# Patient Record
Sex: Male | Born: 1945 | Race: White | Hispanic: No | Marital: Married | Smoking: Former smoker
Health system: Southern US, Community
[De-identification: ages and names within clinical notes are randomized; demographics above are authoritative.]

## PROBLEM LIST (undated history)

## (undated) DIAGNOSIS — K5790 Diverticulosis of intestine, part unspecified, without perforation or abscess without bleeding: Secondary | ICD-10-CM

## (undated) DIAGNOSIS — G44009 Cluster headache syndrome, unspecified, not intractable: Secondary | ICD-10-CM

## (undated) DIAGNOSIS — G459 Transient cerebral ischemic attack, unspecified: Secondary | ICD-10-CM

## (undated) DIAGNOSIS — D369 Benign neoplasm, unspecified site: Secondary | ICD-10-CM

## (undated) DIAGNOSIS — Z6841 Body Mass Index (BMI) 40.0 and over, adult: Secondary | ICD-10-CM

## (undated) DIAGNOSIS — I440 Atrioventricular block, first degree: Secondary | ICD-10-CM

## (undated) DIAGNOSIS — K648 Other hemorrhoids: Secondary | ICD-10-CM

## (undated) DIAGNOSIS — I4891 Unspecified atrial fibrillation: Secondary | ICD-10-CM

## (undated) DIAGNOSIS — Z950 Presence of cardiac pacemaker: Secondary | ICD-10-CM

## (undated) DIAGNOSIS — R001 Bradycardia, unspecified: Secondary | ICD-10-CM

## (undated) DIAGNOSIS — I498 Other specified cardiac arrhythmias: Secondary | ICD-10-CM

## (undated) DIAGNOSIS — B999 Unspecified infectious disease: Secondary | ICD-10-CM

## (undated) DIAGNOSIS — C4490 Unspecified malignant neoplasm of skin, unspecified: Secondary | ICD-10-CM

## (undated) DIAGNOSIS — I10 Essential (primary) hypertension: Secondary | ICD-10-CM

## (undated) DIAGNOSIS — Z8739 Personal history of other diseases of the musculoskeletal system and connective tissue: Secondary | ICD-10-CM

## (undated) DIAGNOSIS — I5032 Chronic diastolic (congestive) heart failure: Secondary | ICD-10-CM

## (undated) DIAGNOSIS — I422 Other hypertrophic cardiomyopathy: Secondary | ICD-10-CM

## (undated) HISTORY — DX: Unspecified atrial fibrillation: I48.91

## (undated) HISTORY — DX: Bradycardia, unspecified: R00.1

## (undated) HISTORY — PX: INSERT / REPLACE / REMOVE PACEMAKER: SUR710

## (undated) HISTORY — PX: SKIN CANCER EXCISION: SHX779

## (undated) HISTORY — DX: Other specified cardiac arrhythmias: I49.8

## (undated) HISTORY — DX: Diverticulosis of intestine, part unspecified, without perforation or abscess without bleeding: K57.90

## (undated) HISTORY — DX: Benign neoplasm, unspecified site: D36.9

## (undated) HISTORY — DX: Transient cerebral ischemic attack, unspecified: G45.9

## (undated) HISTORY — PX: CARDIAC CATHETERIZATION: SHX172

## (undated) HISTORY — DX: Chronic diastolic (congestive) heart failure: I50.32

## (undated) HISTORY — DX: Morbid (severe) obesity due to excess calories: E66.01

## (undated) HISTORY — DX: Other hypertrophic cardiomyopathy: I42.2

## (undated) HISTORY — DX: Other hemorrhoids: K64.8

## (undated) HISTORY — DX: Atrioventricular block, first degree: I44.0

## (undated) HISTORY — PX: CARDIAC VALVE REPLACEMENT: SHX585

## (undated) HISTORY — PX: COLONOSCOPY: SHX174

## (undated) HISTORY — DX: Presence of cardiac pacemaker: Z95.0

## (undated) HISTORY — DX: Essential (primary) hypertension: I10

---

## 1997-07-22 HISTORY — PX: MITRAL VALVE REPLACEMENT: SHX147

## 1997-07-22 HISTORY — PX: OTHER SURGICAL HISTORY: SHX169

## 1997-07-22 HISTORY — PX: AORTIC VALVE REPAIR: SHX6306

## 1999-04-11 DIAGNOSIS — G459 Transient cerebral ischemic attack, unspecified: Secondary | ICD-10-CM

## 1999-04-11 HISTORY — DX: Transient cerebral ischemic attack, unspecified: G45.9

## 1999-07-11 ENCOUNTER — Ambulatory Visit (HOSPITAL_COMMUNITY): Admission: RE | Admit: 1999-07-11 | Discharge: 1999-07-11 | Payer: Self-pay | Admitting: Cardiology

## 2000-01-03 ENCOUNTER — Ambulatory Visit (HOSPITAL_COMMUNITY): Admission: RE | Admit: 2000-01-03 | Discharge: 2000-01-03 | Payer: Self-pay | Admitting: *Deleted

## 2005-10-19 ENCOUNTER — Ambulatory Visit (HOSPITAL_COMMUNITY): Admission: RE | Admit: 2005-10-19 | Discharge: 2005-10-19 | Payer: Self-pay | Admitting: Gastroenterology

## 2005-10-19 ENCOUNTER — Encounter (INDEPENDENT_AMBULATORY_CARE_PROVIDER_SITE_OTHER): Payer: Self-pay | Admitting: Specialist

## 2006-10-03 ENCOUNTER — Encounter: Payer: Self-pay | Admitting: Internal Medicine

## 2006-10-03 ENCOUNTER — Ambulatory Visit: Payer: Self-pay | Admitting: Internal Medicine

## 2006-10-10 ENCOUNTER — Ambulatory Visit: Payer: Self-pay | Admitting: Internal Medicine

## 2006-10-10 LAB — CONVERTED CEMR LAB
BUN: 14 mg/dL (ref 6–23)
Basophils Relative: 0.5 % (ref 0.0–1.0)
Eosinophils Relative: 1.6 % (ref 0.0–5.0)
GFR calc Af Amer: 111 mL/min
GFR calc non Af Amer: 92 mL/min
Glucose, Bld: 102 mg/dL — ABNORMAL HIGH (ref 70–99)
HCT: 44.7 % (ref 39.0–52.0)
Lymphocytes Relative: 16.1 % (ref 12.0–46.0)
MCHC: 33.8 g/dL (ref 30.0–36.0)
Magnesium: 2.2 mg/dL (ref 1.5–2.5)
Monocytes Absolute: 1.1 10*3/uL — ABNORMAL HIGH (ref 0.2–0.7)
Monocytes Relative: 10.7 % (ref 3.0–11.0)
Neutro Abs: 7 10*3/uL (ref 1.4–7.7)
Neutrophils Relative %: 71.1 % (ref 43.0–77.0)
Potassium: 3.6 meq/L (ref 3.5–5.1)
RDW: 12.8 % (ref 11.5–14.6)
TSH: 2.18 microintl units/mL (ref 0.35–5.50)
WBC: 9.9 10*3/uL (ref 4.5–10.5)

## 2006-10-18 ENCOUNTER — Ambulatory Visit: Payer: Self-pay

## 2006-10-22 ENCOUNTER — Ambulatory Visit: Payer: Self-pay

## 2006-10-31 ENCOUNTER — Ambulatory Visit: Payer: Self-pay | Admitting: Cardiology

## 2006-11-01 ENCOUNTER — Ambulatory Visit: Payer: Self-pay

## 2006-11-02 ENCOUNTER — Ambulatory Visit: Payer: Self-pay

## 2006-11-14 ENCOUNTER — Ambulatory Visit: Payer: Self-pay | Admitting: Pulmonary Disease

## 2006-11-20 ENCOUNTER — Ambulatory Visit: Admission: RE | Admit: 2006-11-20 | Discharge: 2006-11-20 | Payer: Self-pay | Admitting: Pulmonary Disease

## 2006-12-24 ENCOUNTER — Ambulatory Visit: Payer: Self-pay | Admitting: Pulmonary Disease

## 2007-06-05 ENCOUNTER — Encounter: Payer: Self-pay | Admitting: Pulmonary Disease

## 2007-06-10 DIAGNOSIS — R0602 Shortness of breath: Secondary | ICD-10-CM

## 2007-06-10 DIAGNOSIS — I10 Essential (primary) hypertension: Secondary | ICD-10-CM

## 2007-06-10 DIAGNOSIS — Z85820 Personal history of malignant melanoma of skin: Secondary | ICD-10-CM

## 2007-06-10 DIAGNOSIS — I421 Obstructive hypertrophic cardiomyopathy: Secondary | ICD-10-CM

## 2007-06-10 DIAGNOSIS — R942 Abnormal results of pulmonary function studies: Secondary | ICD-10-CM | POA: Insufficient documentation

## 2007-06-11 ENCOUNTER — Telehealth: Payer: Self-pay | Admitting: Pulmonary Disease

## 2007-06-12 ENCOUNTER — Telehealth: Payer: Self-pay | Admitting: Pulmonary Disease

## 2007-11-18 ENCOUNTER — Ambulatory Visit: Payer: Self-pay | Admitting: Cardiology

## 2007-11-18 ENCOUNTER — Ambulatory Visit: Payer: Self-pay

## 2007-11-18 LAB — CONVERTED CEMR LAB
Basophils Absolute: 0 10*3/uL (ref 0.0–0.1)
Basophils Relative: 0.3 % (ref 0.0–3.0)
Eosinophils Absolute: 0.2 10*3/uL (ref 0.0–0.7)
Hemoglobin: 13.8 g/dL (ref 13.0–17.0)
Lymphocytes Relative: 14.6 % (ref 12.0–46.0)
Neutro Abs: 7 10*3/uL (ref 1.4–7.7)
Neutrophils Relative %: 73.4 % (ref 43.0–77.0)
Platelets: 265 10*3/uL (ref 150–400)
RDW: 13.5 % (ref 11.5–14.6)

## 2007-11-20 ENCOUNTER — Telehealth (INDEPENDENT_AMBULATORY_CARE_PROVIDER_SITE_OTHER): Payer: Self-pay | Admitting: *Deleted

## 2008-05-12 ENCOUNTER — Ambulatory Visit: Payer: Self-pay | Admitting: Internal Medicine

## 2008-05-12 ENCOUNTER — Ambulatory Visit: Payer: Self-pay | Admitting: Cardiology

## 2008-05-12 LAB — CONVERTED CEMR LAB
BUN: 15 mg/dL (ref 6–23)
Calcium: 9.3 mg/dL (ref 8.4–10.5)
Chloride: 99 meq/L (ref 96–112)
Creatinine, Ser: 0.8 mg/dL (ref 0.4–1.5)
GFR calc non Af Amer: 105 mL/min
Glucose, Bld: 104 mg/dL — ABNORMAL HIGH (ref 70–99)
Pro B Natriuretic peptide (BNP): 25 pg/mL (ref 0.0–100.0)
Sodium: 138 meq/L (ref 135–145)

## 2008-05-29 ENCOUNTER — Ambulatory Visit: Payer: Self-pay | Admitting: Internal Medicine

## 2008-05-29 ENCOUNTER — Ambulatory Visit (HOSPITAL_COMMUNITY): Admission: RE | Admit: 2008-05-29 | Discharge: 2008-05-29 | Payer: Self-pay | Admitting: Internal Medicine

## 2008-06-17 ENCOUNTER — Ambulatory Visit: Payer: Self-pay | Admitting: Internal Medicine

## 2008-07-17 DIAGNOSIS — I440 Atrioventricular block, first degree: Secondary | ICD-10-CM | POA: Insufficient documentation

## 2008-07-20 ENCOUNTER — Ambulatory Visit: Payer: Self-pay | Admitting: Internal Medicine

## 2008-07-21 ENCOUNTER — Ambulatory Visit: Payer: Self-pay | Admitting: Internal Medicine

## 2008-07-21 LAB — CONVERTED CEMR LAB
BUN: 15 mg/dL (ref 6–23)
Basophils Relative: 0.5 % (ref 0.0–3.0)
CO2: 30 meq/L (ref 19–32)
Calcium: 8.9 mg/dL (ref 8.4–10.5)
Creatinine, Ser: 1 mg/dL (ref 0.4–1.5)
Eosinophils Relative: 2.7 % (ref 0.0–5.0)
Lymphocytes Relative: 18 % (ref 12.0–46.0)
MCHC: 34.8 g/dL (ref 30.0–36.0)
MCV: 94.7 fL (ref 78.0–100.0)
Monocytes Relative: 9.2 % (ref 3.0–12.0)
Potassium: 3.5 meq/L (ref 3.5–5.1)
Prothrombin Time: 32.9 s — ABNORMAL HIGH (ref 10.9–13.3)
WBC: 7.1 10*3/uL (ref 4.5–10.5)
aPTT: 40 s — ABNORMAL HIGH (ref 21.7–28.8)

## 2008-07-22 ENCOUNTER — Ambulatory Visit (HOSPITAL_COMMUNITY): Admission: RE | Admit: 2008-07-22 | Discharge: 2008-07-23 | Payer: Self-pay | Admitting: Internal Medicine

## 2008-07-22 ENCOUNTER — Ambulatory Visit: Payer: Self-pay | Admitting: Internal Medicine

## 2008-07-23 ENCOUNTER — Encounter: Payer: Self-pay | Admitting: Internal Medicine

## 2008-07-27 ENCOUNTER — Encounter: Payer: Self-pay | Admitting: *Deleted

## 2008-07-27 ENCOUNTER — Ambulatory Visit: Payer: Self-pay

## 2008-08-12 ENCOUNTER — Telehealth: Payer: Self-pay | Admitting: Internal Medicine

## 2008-08-19 ENCOUNTER — Encounter (INDEPENDENT_AMBULATORY_CARE_PROVIDER_SITE_OTHER): Payer: Self-pay | Admitting: *Deleted

## 2008-09-08 ENCOUNTER — Encounter: Payer: Self-pay | Admitting: *Deleted

## 2008-09-21 ENCOUNTER — Encounter: Payer: Self-pay | Admitting: Internal Medicine

## 2008-09-22 ENCOUNTER — Encounter: Payer: Self-pay | Admitting: Internal Medicine

## 2008-09-24 ENCOUNTER — Ambulatory Visit: Payer: Self-pay

## 2008-09-24 ENCOUNTER — Encounter: Payer: Self-pay | Admitting: Internal Medicine

## 2008-10-08 ENCOUNTER — Encounter (INDEPENDENT_AMBULATORY_CARE_PROVIDER_SITE_OTHER): Payer: Self-pay | Admitting: *Deleted

## 2008-10-09 ENCOUNTER — Encounter: Payer: Self-pay | Admitting: Cardiovascular Disease

## 2008-10-14 ENCOUNTER — Encounter: Payer: Self-pay | Admitting: *Deleted

## 2008-10-16 ENCOUNTER — Encounter: Payer: Self-pay | Admitting: Cardiology

## 2008-10-16 ENCOUNTER — Encounter: Payer: Self-pay | Admitting: Internal Medicine

## 2008-10-20 ENCOUNTER — Encounter: Payer: Self-pay | Admitting: Cardiology

## 2008-10-22 ENCOUNTER — Telehealth: Payer: Self-pay | Admitting: Internal Medicine

## 2008-10-25 ENCOUNTER — Encounter: Payer: Self-pay | Admitting: Cardiology

## 2008-10-26 ENCOUNTER — Encounter: Payer: Self-pay | Admitting: Cardiology

## 2008-10-26 LAB — CONVERTED CEMR LAB: POC INR: 2.8

## 2008-11-06 ENCOUNTER — Encounter (INDEPENDENT_AMBULATORY_CARE_PROVIDER_SITE_OTHER): Payer: Self-pay | Admitting: *Deleted

## 2008-11-09 ENCOUNTER — Encounter: Payer: Self-pay | Admitting: Internal Medicine

## 2008-11-09 LAB — CONVERTED CEMR LAB: POC INR: 2.5

## 2008-11-12 ENCOUNTER — Encounter (INDEPENDENT_AMBULATORY_CARE_PROVIDER_SITE_OTHER): Payer: Self-pay | Admitting: *Deleted

## 2008-11-13 ENCOUNTER — Encounter: Payer: Self-pay | Admitting: Cardiology

## 2008-11-13 LAB — CONVERTED CEMR LAB: POC INR: 3

## 2008-11-20 ENCOUNTER — Encounter (INDEPENDENT_AMBULATORY_CARE_PROVIDER_SITE_OTHER): Payer: Self-pay | Admitting: *Deleted

## 2008-11-23 ENCOUNTER — Ambulatory Visit: Payer: Self-pay | Admitting: Internal Medicine

## 2008-11-23 DIAGNOSIS — I472 Ventricular tachycardia: Secondary | ICD-10-CM

## 2008-11-26 ENCOUNTER — Telehealth: Payer: Self-pay | Admitting: Internal Medicine

## 2008-12-03 ENCOUNTER — Encounter: Payer: Self-pay | Admitting: Cardiovascular Disease

## 2008-12-08 ENCOUNTER — Telehealth (INDEPENDENT_AMBULATORY_CARE_PROVIDER_SITE_OTHER): Payer: Self-pay | Admitting: *Deleted

## 2008-12-16 ENCOUNTER — Telehealth (INDEPENDENT_AMBULATORY_CARE_PROVIDER_SITE_OTHER): Payer: Self-pay | Admitting: *Deleted

## 2008-12-19 ENCOUNTER — Encounter: Payer: Self-pay | Admitting: Internal Medicine

## 2008-12-21 ENCOUNTER — Encounter: Payer: Self-pay | Admitting: Cardiovascular Disease

## 2008-12-21 LAB — CONVERTED CEMR LAB: POC INR: 3

## 2008-12-23 ENCOUNTER — Ambulatory Visit: Payer: Self-pay | Admitting: Internal Medicine

## 2008-12-25 ENCOUNTER — Telehealth (INDEPENDENT_AMBULATORY_CARE_PROVIDER_SITE_OTHER): Payer: Self-pay | Admitting: *Deleted

## 2009-01-03 ENCOUNTER — Encounter (INDEPENDENT_AMBULATORY_CARE_PROVIDER_SITE_OTHER): Payer: Self-pay | Admitting: Pharmacist

## 2009-01-04 ENCOUNTER — Encounter: Payer: Self-pay | Admitting: Cardiology

## 2009-01-04 LAB — CONVERTED CEMR LAB: POC INR: 3.5

## 2009-01-06 ENCOUNTER — Telehealth (INDEPENDENT_AMBULATORY_CARE_PROVIDER_SITE_OTHER): Payer: Self-pay | Admitting: *Deleted

## 2009-01-12 ENCOUNTER — Encounter: Payer: Self-pay | Admitting: Cardiology

## 2009-01-12 LAB — CONVERTED CEMR LAB: POC INR: 3.4

## 2009-01-21 ENCOUNTER — Encounter (INDEPENDENT_AMBULATORY_CARE_PROVIDER_SITE_OTHER): Payer: Self-pay | Admitting: Pharmacist

## 2009-01-22 ENCOUNTER — Encounter: Payer: Self-pay | Admitting: Internal Medicine

## 2009-02-05 ENCOUNTER — Encounter: Payer: Self-pay | Admitting: Internal Medicine

## 2009-02-08 ENCOUNTER — Encounter: Payer: Self-pay | Admitting: Cardiology

## 2009-02-25 ENCOUNTER — Encounter: Payer: Self-pay | Admitting: Internal Medicine

## 2009-02-25 ENCOUNTER — Encounter (INDEPENDENT_AMBULATORY_CARE_PROVIDER_SITE_OTHER): Payer: Self-pay | Admitting: Cardiology

## 2009-02-26 ENCOUNTER — Encounter: Payer: Self-pay | Admitting: Internal Medicine

## 2009-03-13 ENCOUNTER — Encounter (INDEPENDENT_AMBULATORY_CARE_PROVIDER_SITE_OTHER): Payer: Self-pay | Admitting: Cardiology

## 2009-03-13 ENCOUNTER — Encounter: Payer: Self-pay | Admitting: Cardiovascular Disease

## 2009-03-15 ENCOUNTER — Ambulatory Visit: Payer: Self-pay

## 2009-03-15 ENCOUNTER — Encounter: Payer: Self-pay | Admitting: Internal Medicine

## 2009-03-16 ENCOUNTER — Encounter: Payer: Self-pay | Admitting: Cardiovascular Disease

## 2009-03-30 ENCOUNTER — Encounter: Payer: Self-pay | Admitting: Internal Medicine

## 2009-03-30 LAB — CONVERTED CEMR LAB: POC INR: 3.1

## 2009-04-13 ENCOUNTER — Ambulatory Visit: Payer: Self-pay | Admitting: Internal Medicine

## 2009-04-13 ENCOUNTER — Encounter: Payer: Self-pay | Admitting: Internal Medicine

## 2009-04-15 LAB — CONVERTED CEMR LAB
BUN: 10 mg/dL (ref 6–23)
CO2: 26 meq/L (ref 19–32)
Creatinine, Ser: 0.8 mg/dL (ref 0.4–1.5)
GFR calc non Af Amer: 104.44 mL/min (ref >60)
Glucose, Bld: 91 mg/dL (ref 70–99)
Pro B Natriuretic peptide (BNP): 52 pg/mL (ref 0.0–100.0)

## 2009-04-16 ENCOUNTER — Encounter: Payer: Self-pay | Admitting: Cardiovascular Disease

## 2009-04-21 ENCOUNTER — Ambulatory Visit (HOSPITAL_COMMUNITY): Admission: RE | Admit: 2009-04-21 | Discharge: 2009-04-21 | Payer: Self-pay | Admitting: Internal Medicine

## 2009-04-21 ENCOUNTER — Ambulatory Visit: Payer: Self-pay | Admitting: Cardiovascular Disease

## 2009-04-21 ENCOUNTER — Ambulatory Visit: Payer: Self-pay

## 2009-04-21 ENCOUNTER — Encounter: Payer: Self-pay | Admitting: Internal Medicine

## 2009-05-04 ENCOUNTER — Telehealth: Payer: Self-pay | Admitting: Internal Medicine

## 2009-05-04 ENCOUNTER — Encounter (INDEPENDENT_AMBULATORY_CARE_PROVIDER_SITE_OTHER): Payer: Self-pay | Admitting: Cardiology

## 2009-05-04 ENCOUNTER — Ambulatory Visit: Payer: Self-pay | Admitting: Internal Medicine

## 2009-05-05 ENCOUNTER — Encounter: Payer: Self-pay | Admitting: Internal Medicine

## 2009-05-05 ENCOUNTER — Ambulatory Visit: Payer: Self-pay | Admitting: Internal Medicine

## 2009-05-05 ENCOUNTER — Encounter (INDEPENDENT_AMBULATORY_CARE_PROVIDER_SITE_OTHER): Payer: Self-pay | Admitting: Cardiology

## 2009-05-05 ENCOUNTER — Ambulatory Visit: Payer: Self-pay | Admitting: Cardiology

## 2009-05-06 ENCOUNTER — Telehealth (INDEPENDENT_AMBULATORY_CARE_PROVIDER_SITE_OTHER): Payer: Self-pay | Admitting: *Deleted

## 2009-05-12 ENCOUNTER — Ambulatory Visit: Payer: Self-pay | Admitting: Cardiovascular Disease

## 2009-05-12 ENCOUNTER — Encounter (INDEPENDENT_AMBULATORY_CARE_PROVIDER_SITE_OTHER): Payer: Self-pay | Admitting: Cardiology

## 2009-05-13 ENCOUNTER — Ambulatory Visit: Payer: Self-pay | Admitting: Internal Medicine

## 2009-05-14 ENCOUNTER — Encounter (INDEPENDENT_AMBULATORY_CARE_PROVIDER_SITE_OTHER): Payer: Self-pay | Admitting: Cardiology

## 2009-05-14 ENCOUNTER — Ambulatory Visit: Payer: Self-pay | Admitting: Cardiovascular Disease

## 2009-05-17 ENCOUNTER — Telehealth: Payer: Self-pay | Admitting: Internal Medicine

## 2009-05-22 ENCOUNTER — Encounter (INDEPENDENT_AMBULATORY_CARE_PROVIDER_SITE_OTHER): Payer: Self-pay | Admitting: Cardiology

## 2009-05-24 ENCOUNTER — Encounter: Payer: Self-pay | Admitting: Cardiology

## 2009-05-24 LAB — CONVERTED CEMR LAB: POC INR: 3.5

## 2009-05-28 ENCOUNTER — Telehealth (INDEPENDENT_AMBULATORY_CARE_PROVIDER_SITE_OTHER): Payer: Self-pay | Admitting: *Deleted

## 2009-06-09 ENCOUNTER — Encounter: Payer: Self-pay | Admitting: Cardiology

## 2009-06-09 LAB — CONVERTED CEMR LAB: POC INR: 2.6

## 2009-06-17 ENCOUNTER — Telehealth: Payer: Self-pay | Admitting: Internal Medicine

## 2009-06-18 ENCOUNTER — Encounter: Payer: Self-pay | Admitting: Internal Medicine

## 2009-06-23 ENCOUNTER — Encounter: Payer: Self-pay | Admitting: Internal Medicine

## 2009-06-27 ENCOUNTER — Encounter (INDEPENDENT_AMBULATORY_CARE_PROVIDER_SITE_OTHER): Payer: Self-pay | Admitting: Pharmacist

## 2009-06-28 ENCOUNTER — Encounter: Payer: Self-pay | Admitting: Cardiology

## 2009-06-28 LAB — CONVERTED CEMR LAB: POC INR: 4

## 2009-07-04 ENCOUNTER — Encounter (INDEPENDENT_AMBULATORY_CARE_PROVIDER_SITE_OTHER): Payer: Self-pay | Admitting: Pharmacist

## 2009-07-05 ENCOUNTER — Encounter: Payer: Self-pay | Admitting: Cardiovascular Disease

## 2009-07-26 ENCOUNTER — Encounter (INDEPENDENT_AMBULATORY_CARE_PROVIDER_SITE_OTHER): Payer: Self-pay | Admitting: Pharmacist

## 2009-07-26 ENCOUNTER — Encounter: Payer: Self-pay | Admitting: Cardiology

## 2009-07-26 LAB — CONVERTED CEMR LAB: POC INR: 3.5

## 2009-07-27 ENCOUNTER — Encounter: Payer: Self-pay | Admitting: Cardiology

## 2009-07-28 ENCOUNTER — Telehealth: Payer: Self-pay | Admitting: Internal Medicine

## 2009-08-01 ENCOUNTER — Encounter (INDEPENDENT_AMBULATORY_CARE_PROVIDER_SITE_OTHER): Payer: Self-pay | Admitting: Pharmacist

## 2009-08-02 ENCOUNTER — Encounter: Payer: Self-pay | Admitting: Cardiovascular Disease

## 2009-08-02 LAB — CONVERTED CEMR LAB: POC INR: 2.5

## 2009-08-10 ENCOUNTER — Encounter (INDEPENDENT_AMBULATORY_CARE_PROVIDER_SITE_OTHER): Payer: Self-pay | Admitting: Pharmacist

## 2009-08-10 ENCOUNTER — Ambulatory Visit: Payer: Self-pay | Admitting: Internal Medicine

## 2009-08-10 ENCOUNTER — Ambulatory Visit: Payer: Self-pay | Admitting: Cardiology

## 2009-08-10 DIAGNOSIS — I5032 Chronic diastolic (congestive) heart failure: Secondary | ICD-10-CM

## 2009-08-11 ENCOUNTER — Encounter: Payer: Self-pay | Admitting: Cardiology

## 2009-08-11 LAB — CONVERTED CEMR LAB: POC INR: 3.8

## 2009-08-12 ENCOUNTER — Ambulatory Visit: Payer: Self-pay | Admitting: Internal Medicine

## 2009-08-12 LAB — CONVERTED CEMR LAB
BUN: 18 mg/dL (ref 6–23)
CO2: 31 meq/L (ref 19–32)
Chloride: 95 meq/L — ABNORMAL LOW (ref 96–112)
Creatinine, Ser: 0.8 mg/dL (ref 0.4–1.5)
Potassium: 3.7 meq/L (ref 3.5–5.1)

## 2009-08-26 ENCOUNTER — Ambulatory Visit: Payer: Self-pay | Admitting: Internal Medicine

## 2009-08-26 LAB — CONVERTED CEMR LAB
INR: 2.3
Prothrombin Time: 23.4 s
Prothrombin Time: 23.4 s — ABNORMAL HIGH (ref 9.1–11.7)

## 2009-08-29 ENCOUNTER — Encounter (INDEPENDENT_AMBULATORY_CARE_PROVIDER_SITE_OTHER): Payer: Self-pay | Admitting: Pharmacist

## 2009-08-30 ENCOUNTER — Encounter: Payer: Self-pay | Admitting: Cardiology

## 2009-09-02 ENCOUNTER — Encounter: Payer: Self-pay | Admitting: Cardiology

## 2009-09-02 ENCOUNTER — Encounter: Payer: Self-pay | Admitting: Internal Medicine

## 2009-09-02 LAB — CONVERTED CEMR LAB: POC INR: 3.4

## 2009-09-03 ENCOUNTER — Encounter: Payer: Self-pay | Admitting: Cardiology

## 2009-09-19 ENCOUNTER — Encounter (INDEPENDENT_AMBULATORY_CARE_PROVIDER_SITE_OTHER): Payer: Self-pay | Admitting: Pharmacist

## 2009-09-20 ENCOUNTER — Encounter: Payer: Self-pay | Admitting: Internal Medicine

## 2009-10-15 ENCOUNTER — Encounter: Payer: Self-pay | Admitting: Internal Medicine

## 2009-10-22 ENCOUNTER — Encounter: Payer: Self-pay | Admitting: Cardiovascular Disease

## 2009-10-22 LAB — CONVERTED CEMR LAB: POC INR: 2.8

## 2009-11-15 ENCOUNTER — Telehealth: Payer: Self-pay | Admitting: Internal Medicine

## 2009-11-18 ENCOUNTER — Ambulatory Visit (HOSPITAL_COMMUNITY): Admission: RE | Admit: 2009-11-18 | Discharge: 2009-11-18 | Payer: Self-pay | Admitting: Gastroenterology

## 2009-11-19 ENCOUNTER — Encounter: Payer: Self-pay | Admitting: Internal Medicine

## 2009-12-01 ENCOUNTER — Ambulatory Visit: Payer: Self-pay | Admitting: Internal Medicine

## 2009-12-01 LAB — CONVERTED CEMR LAB: POC INR: 1.5

## 2009-12-03 ENCOUNTER — Ambulatory Visit: Payer: Self-pay | Admitting: Cardiology

## 2009-12-03 LAB — CONVERTED CEMR LAB: POC INR: 2.3

## 2009-12-08 ENCOUNTER — Encounter: Payer: Self-pay | Admitting: Cardiology

## 2009-12-29 ENCOUNTER — Encounter: Payer: Self-pay | Admitting: Internal Medicine

## 2009-12-30 ENCOUNTER — Encounter: Payer: Self-pay | Admitting: Cardiology

## 2009-12-30 LAB — CONVERTED CEMR LAB: POC INR: 2.4

## 2010-01-19 ENCOUNTER — Encounter: Payer: Self-pay | Admitting: Cardiology

## 2010-01-19 LAB — CONVERTED CEMR LAB: POC INR: 3.3

## 2010-01-20 ENCOUNTER — Encounter: Payer: Self-pay | Admitting: Cardiology

## 2010-02-16 ENCOUNTER — Encounter: Payer: Self-pay | Admitting: Internal Medicine

## 2010-02-17 ENCOUNTER — Ambulatory Visit: Payer: Self-pay | Admitting: Internal Medicine

## 2010-02-17 DIAGNOSIS — M109 Gout, unspecified: Secondary | ICD-10-CM

## 2010-02-22 ENCOUNTER — Encounter: Payer: Self-pay | Admitting: Internal Medicine

## 2010-03-17 ENCOUNTER — Encounter
Admission: RE | Admit: 2010-03-17 | Discharge: 2010-03-17 | Payer: Self-pay | Source: Home / Self Care | Attending: Family Medicine | Admitting: Family Medicine

## 2010-03-25 ENCOUNTER — Encounter (INDEPENDENT_AMBULATORY_CARE_PROVIDER_SITE_OTHER): Payer: Self-pay | Admitting: Pharmacist

## 2010-03-28 ENCOUNTER — Encounter: Payer: Self-pay | Admitting: Internal Medicine

## 2010-03-28 LAB — CONVERTED CEMR LAB: POC INR: 3.5

## 2010-04-12 ENCOUNTER — Encounter: Payer: Self-pay | Admitting: Cardiology

## 2010-04-12 LAB — CONVERTED CEMR LAB: POC INR: 2.9

## 2010-04-22 ENCOUNTER — Telehealth (INDEPENDENT_AMBULATORY_CARE_PROVIDER_SITE_OTHER): Payer: Self-pay | Admitting: *Deleted

## 2010-04-22 ENCOUNTER — Encounter (INDEPENDENT_AMBULATORY_CARE_PROVIDER_SITE_OTHER): Payer: Self-pay | Admitting: Pharmacist

## 2010-04-25 ENCOUNTER — Encounter: Payer: Self-pay | Admitting: Cardiology

## 2010-05-01 ENCOUNTER — Encounter: Payer: Self-pay | Admitting: Internal Medicine

## 2010-05-01 ENCOUNTER — Encounter (INDEPENDENT_AMBULATORY_CARE_PROVIDER_SITE_OTHER): Payer: Self-pay | Admitting: Pharmacist

## 2010-05-02 ENCOUNTER — Telehealth: Payer: Self-pay | Admitting: Internal Medicine

## 2010-05-02 ENCOUNTER — Encounter: Payer: Self-pay | Admitting: Cardiology

## 2010-05-03 ENCOUNTER — Ambulatory Visit
Admission: RE | Admit: 2010-05-03 | Discharge: 2010-05-03 | Payer: Self-pay | Source: Home / Self Care | Attending: Internal Medicine | Admitting: Internal Medicine

## 2010-05-03 ENCOUNTER — Encounter: Payer: Self-pay | Admitting: Internal Medicine

## 2010-05-04 ENCOUNTER — Encounter: Payer: Self-pay | Admitting: Internal Medicine

## 2010-05-08 ENCOUNTER — Encounter: Payer: Self-pay | Admitting: Cardiology

## 2010-05-08 ENCOUNTER — Encounter: Payer: Self-pay | Admitting: Internal Medicine

## 2010-05-08 LAB — CONVERTED CEMR LAB
BUN: 17 mg/dL (ref 6–23)
Basophils Absolute: 0 10*3/uL (ref 0.0–0.1)
Basophils Relative: 0.3 % (ref 0.0–3.0)
CO2: 31 meq/L (ref 19–32)
Calcium: 9.1 mg/dL (ref 8.4–10.5)
Calcium: 9.3 mg/dL (ref 8.4–10.5)
Chloride: 96 meq/L (ref 96–112)
Creatinine, Ser: 0.9 mg/dL (ref 0.4–1.5)
Creatinine, Ser: 0.9 mg/dL (ref 0.4–1.5)
GFR calc non Af Amer: 91.14 mL/min (ref >60)
Lymphocytes Relative: 12.1 % (ref 12.0–46.0)
MCHC: 32.7 g/dL (ref 30.0–36.0)
Monocytes Absolute: 0.4 10*3/uL (ref 0.1–1.0)
Monocytes Relative: 4.2 % (ref 3.0–12.0)
Neutro Abs: 7.6 10*3/uL (ref 1.4–7.7)
Potassium: 3.8 meq/L (ref 3.5–5.1)
RBC: 4.34 M/uL (ref 4.22–5.81)
RDW: 13.3 % (ref 11.5–14.6)
WBC: 9.3 10*3/uL (ref 4.5–10.5)

## 2010-05-09 ENCOUNTER — Encounter: Payer: Self-pay | Admitting: Cardiology

## 2010-05-10 NOTE — Medication Information (Signed)
Summary: INR Report  INR Report   Imported By: Lucretia Roers 09/29/2009 11:55:36  _____________________________________________________________________  External Attachment:    Type:   Image     Comment:   External Document

## 2010-05-10 NOTE — Medication Information (Signed)
Summary: rov/sp  Anticoagulant Therapy  Managed by: Gypsy Lore, PharmD Referring MD: [name] MD PCP: dr Bryn Gulling MD: Percival Spanish MD, Jeneen Rinks Indication 1: Mitral Valve Replacement (ICD-V43.3) Indication 2: TIA (ICD-435.9) Lab Used: Home Monitoring  [place] Site: [place] INR POC 2.3 INR RANGE 2.5 - 3.5  Dietary changes: no    Health status changes: no    Bleeding/hemorrhagic complications: no    Recent/future hospitalizations: no    Any changes in medication regimen? no    Recent/future dental: no  Any missed doses?: no       Is patient compliant with meds? yes       Allergies: No Known Drug Allergies  Anticoagulation Management History:      The patient is taking warfarin and comes in today for a routine follow up visit.  Negative risk factors for bleeding include an age less than 65 years old.  The bleeding index is 'low risk'.  Positive CHADS2 values include History of CHF and History of HTN.  Negative CHADS2 values include Age > 79 years old.  The start date was 04/10/1997.  His last INR was 2.3 ratio.  Anticoagulation responsible provider: Percival Spanish MD, Jeneen Rinks.  INR POC: 2.3.  C[place]ette Lot#: [id number].  Exp: 11/2010.    Anticoagulation Management Assessment/Plan:      The patient's current anticoagulation dose is Coumadin 10 mg  tabs: use as directed by coumadin clinic.  The target INR is 2.5 - 3.5.  The next INR is due 12/01/2009.  Anticoagulation instructions were given to patient.  Results were reviewed/authorized by Gypsy Lore, PharmD.  He was notified by Gypsy Lore PharmD.         Prior Anticoagulation Instructions: INR 1.5  Take 15mg  today and tomorrow. Recheck INR on Friday. Continue Arixtra   Current Anticoagulation Instructions: INR 2.3  Stop Arixtra today. Continue taking Coumadin 1 tab (10 mg) every day.  Recheck next Wednesday.

## 2010-05-10 NOTE — Letter (Signed)
Summary: Handout Printed  Printed Handout:  - Coumadin Instructions-w/out Meds 

## 2010-05-10 NOTE — Letter (Signed)
Summary: Cardiac Catheterization Instructions- [place], [place]  [id number] [place]   [place], [place] [address]   Phone: [phone]  Fax: [phone]     05/04/2009 MRN: [id number]  [place], [place]  [address]  Dear Mr. PLUMLEY,   You are scheduled for a Cardiac Catheterization on 05/10/2009 with Dr.Bensimhom.  Please arrive to the 1st floor of the [facility] and V[facility] Center at [facility] at 0730 am / pm on the day of your procedure. Please do not arrive before 6:30 a.m. Call the Heart and Vascular Center at [phone] if you are unable to make your appointmnet. The Code to get into the parking garage under the building is 0090. Take the elevators to the 1st floor. You must have someone to drive you home. Someone must be with you for the first 24 hours after you arrive home. Please wear clothes that are easy to get on and off and wear slip-on shoes. Do not eat or drink after midnight except water with your medications that morning. Bring all your medications and current insurance cards with you.  ___ Please see instructions given to you by Lalla Brothers D ___ Pre-med instructions:  ________________________________________________________________________________________________________________________________  The usual length of stay after your procedure is 2 to 3 hours. This can vary.  If you have any questions, please call the office at the number listed above.   Barnett Abu, RN, BSN

## 2010-05-10 NOTE — Medication Information (Signed)
Summary: Coumadin Clinic  Anticoagulant Therapy  Managed by: [name], RN, BSN Referring MD: [name] MD PCP: dr Bryn Gulling MD: [name] MD, Quillian Quince Indication 1: Mitral Valve Replacement (ICD-V43.3) Indication 2: TIA (ICD-435.9) Lab Used: Home Monitoring  [place] Site: [place] INR POC 2.6 INR RANGE 2.5 - 3.5  Dietary changes: no    Health status changes: no    Bleeding/hemorrhagic complications: no    Recent/future hospitalizations: no    Any changes in medication regimen? no    Recent/future dental: no  Any missed doses?: no       Is patient compliant with meds? yes      Comments: 11/18/09  Colonoscopy  Allergies: No Known Drug Allergies  Anticoagulation Management History:      His anticoagulation is being managed by telephone today.  Negative risk factors for bleeding include an age less than 17 years old.  The bleeding index is 'low risk'.  Positive CHADS2 values include History of CHF and History of HTN.  Negative CHADS2 values include Age > 81 years old.  The start date was 04/10/1997.  His last INR was 2.3 ratio.  Anticoagulation responsible provider: Hawk Mones MD, Quillian Quince.  INR POC: 2.6.    Anticoagulation Management Assessment/Plan:      The patient's current anticoagulation dose is Coumadin 10 mg  tabs: use as directed by coumadin clinic.  The target INR is 2.5 - 3.5.  The next INR is due 09/27/2009.  Anticoagulation instructions were given to patient.  Results were reviewed/authorized by [name], RN, BSN.  He was notified by [name], RN, BSN.         Prior Anticoagulation Instructions: INR 3.4  LMOM for pt.  Continue same dose of 5mg  daily.  Pt is instructed to call with any changes or questions.  Recheck INR in 1-2 weeks.   Current Anticoagulation Instructions: INR 2.6 Continue 10mg  daily. Recheck in one week. Self-tester.

## 2010-05-10 NOTE — Progress Notes (Signed)
Summary: Question about mediction injections  Phone Note From Pharmacy Call back at [phone]   Caller: [facility] Summary of Call: Question about medication Arixtra Initial call taken by: Delsa Sale,  November 15, 2009 10:34 AM  Follow-up for Phone Call        Pharmacist stated Rx needed prior auth before it could be filled.  Phone number: [phone].  Porfirio Oar PharmD  November 15, 2009 10:56 AM  Spoke with insurance company.  Problem fixed.  [facility] is aware to rerun the Rx.  LM for pt.    Follow-up by: Porfirio Oar PharmD,  November 15, 2009 1:13 PM

## 2010-05-10 NOTE — Medication Information (Signed)
Summary: Coumadin Clinic  Anticoagulant Therapy  Managed by: Porfirio Oar, PharmD Referring MD: [name] MD PCP: dr Bryn Gulling MD: Angelena Form MD, Harrell Gave Indication 1: Mitral Valve Replacement (ICD-V43.3) Indication 2: TIA (ICD-435.9) Lab Used: Home Monitoring  [place] Site: [place] INR POC 2.8 INR RANGE 2.5 - 3.5  Dietary changes: no    Health status changes: no    Bleeding/hemorrhagic complications: no    Recent/future hospitalizations: no    Any changes in medication regimen? no    Recent/future dental: no  Any missed doses?: no       Is patient compliant with meds? yes       Allergies: No Known Drug Allergies  Anticoagulation Management History:      His anticoagulation is being managed by telephone today.  Negative risk factors for bleeding include an age less than 55 years old.  The bleeding index is 'low risk'.  Positive CHADS2 values include History of CHF and History of HTN.  Negative CHADS2 values include Age > 21 years old.  The start date was 04/10/1997.  His last INR was 2.9 ratio.  Anticoagulation responsible provider: Angelena Form MD, Harrell Gave.  INR POC: 2.8.    Anticoagulation Management Assessment/Plan:      The patient's current anticoagulation dose is Coumadin 10 mg  tabs: use as directed by coumadin clinic.  The target INR is 2.5 - 3.5.  The next INR is due 07/12/2009.  Anticoagulation instructions were given to patient.  Results were reviewed/authorized by Porfirio Oar, PharmD.  He was notified by [name], RN, BSN.         Prior Anticoagulation Instructions: INR 4.0 Pt. states last night due to read he took 5mg s, thus instructed him to take 5mg s today as well. Recheck INR in one week.   Current Anticoagulation Instructions: INR 2.8  LM for pt to continue same dose and recheck INR in 1-2 weks. Porfirio Oar, Devin Going D  07/06/09- Telephoned pt and he answered, denies any changes or concerns. He is aware to recheck in one week. [name], RN, BSN  July 06, 2009 3:36 PM

## 2010-05-10 NOTE — Medication Information (Signed)
Summary: Coumadin Clinic  Anticoagulant Therapy  Managed by: [name], RN, BSN Referring MD: [name] MD PCP: dr Bryn Gulling MD: Rayann Heman MD, Jeneen Rinks Indication 1: Mitral Valve Replacement (ICD-V43.3) Indication 2: TIA (ICD-435.9) Lab Used: Home Monitoring  [place] Site: [place] INR POC 3.5 INR RANGE 2.5 - 3.5  Dietary changes: no    Health status changes: no    Bleeding/hemorrhagic complications: no    Recent/future hospitalizations: no    Any changes in medication regimen? no    Recent/future dental: no  Any missed doses?: no       Is patient compliant with meds? yes      Comments: Lab result received from home self tester pt. Drawn on 06/22/09. Pending GI next mth with Dr. Thera Flake, possible Colonoscopy. He will let us know date. He states he is usually covered with Arixtra.   Allergies: No Known Drug Allergies  Anticoagulation Management History:      His anticoagulation is being managed by telephone today.  Negative risk factors for bleeding include an age less than 44 years old.  The bleeding index is 'low risk'.  Positive CHADS2 values include History of CHF and History of HTN.  Negative CHADS2 values include Age > 62 years old.  The start date was 04/10/1997.  His last INR was 2.9 ratio.  Anticoagulation responsible provider: Takeya Marquis MD, Jeneen Rinks.  INR POC: 3.5.    Anticoagulation Management Assessment/Plan:      The patient's current anticoagulation dose is Coumadin 10 mg  tabs: use as directed by coumadin clinic.  The target INR is 2.5 - 3.5.  The next INR is due 06/30/2009.  Anticoagulation instructions were given to patient.  Results were reviewed/authorized by [name], RN, BSN.  He was notified by [name], RN, BSN.         Prior Anticoagulation Instructions: INR 2.6  Called spoke with pt.  Advised to continue on same dosage 10mg  daily.  Recheck in 1 week.  Pt is a self-tester.    Current Anticoagulation Instructions: INR 3.5 Continue  10mg s daily. Recheck in one week. Pt aware.

## 2010-05-10 NOTE — Miscellaneous (Signed)
  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 Jude ACCENT DR RF     (02/16/2010 10:36) Added new observation of PAST SURG HX: Mitral Valve replacement/Aortic Valve repair in April 1999 Surgical Myectomy for hypertrophic cardiomyopathy Colonoscopy (02/16/2010 10:36)       Past History:  Past Medical History: Current Problems:  S/P Mitral valve replacement S/P Aortic valve repair Hypertrophic Cardiomyopathy  with prior myectomy 1999 in [place], [place]    --cardiac cath in 1999 (pre-op): no CAD CHF -Diasystolic ATRIAL ARRHYTHMIAS -multiple noted at [facility] at [place], [place] (ICD-426.11) MORBID OBESITY (ICD-278.01) MELANOMA, HX OF (ICD-V10.82) HYPERTENSION (ICD-401.9) status post implant [employer] Jude ACCENT DR RF     Past Surgical History: Mitral Valve replacement/Aortic Valve repair in April 1999 Surgical Myectomy for hypertrophic cardiomyopathy Colonoscopy

## 2010-05-10 NOTE — Medication Information (Signed)
Summary: Coumadin Clinic  Anticoagulant Therapy  Managed by: Porfirio Oar, PharmD Referring MD: [name] MD PCP: dr Bryn Gulling MD: Stanford Breed MD, Aaron Edelman Indication 1: Mitral Valve Replacement (ICD-V43.3) Indication 2: TIA (ICD-435.9) Lab Used: Home Monitoring  [place] Site: [place] INR POC 3.8 INR RANGE 2.5 - 3.5  Dietary changes: no    Health status changes: no    Bleeding/hemorrhagic complications: no    Recent/future hospitalizations: no    Any changes in medication regimen? no    Recent/future dental: no  Any missed doses?: no       Is patient compliant with meds? yes      Comments: Spoke with pt.  There was a discrepancy between what our machine got and his (2.9 vs 3.8)  I called [name], the rep for [employer].  She is going to check with their customer service to determine the next step.  Mr. Denner meter is several years old and may need to be replaced. Porfirio Oar PharmD  Aug 12, 2009 2:35 PM   [name] from [employer] called and reported that pt will need to call customer care number and the tech team will be able to walk him through the quality control measures for his meter.  If they find an error, they will start the process to replace the meter.  Spoke with pt.  He has the phone number and will try to call this afternoon. Porfirio Oar PharmD  Aug 13, 2009 1:09 PM   Allergies: No Known Drug Allergies  Anticoagulation Management History:      Negative risk factors for bleeding include an age less than 72 years old.  The bleeding index is 'low risk'.  Positive CHADS2 values include History of CHF and History of HTN.  Negative CHADS2 values include Age > 83 years old.  The start date was 04/10/1997.  His last INR was 2.9 ratio.  Anticoagulation responsible provider: Stanford Breed MD, Aaron Edelman.  INR POC: 3.8.  Exp: 09/2010.    Anticoagulation Management Assessment/Plan:      The patient's current anticoagulation dose is Coumadin 10 mg  tabs: use as directed by coumadin  clinic.  The target INR is 2.5 - 3.5.  The next INR is due 08/18/2009.  Anticoagulation instructions were given to patient.  Results were reviewed/authorized by Porfirio Oar, PharmD.  He was notified by Porfirio Oar PharmD.         Prior Anticoagulation Instructions: INR 2.9 Last dose of coumadin 09/03/09, start Arixtra on 09/05/09 daily injection in morning. Last injection on 09/08/09. Procedure on 09/09/09. Restart per GI instructions coumadin and Arixtra.   Current Anticoagulation Instructions: LMOM for pt. Porfirio Oar PharmD  Aug 11, 2009 2:28 PM   Spoke with pt.  Eat a large dose of green vegetables and continue same dose of 1 tablet daily.  Will call pt once information available from [place]. Porfirio Oar PharmD  Aug 12, 2009 2:35 PM

## 2010-05-10 NOTE — Miscellaneous (Signed)
Summary: Orders Update  Clinical Lists Changes  Orders: Added new Test order of T-2 View CXR (71020TC) - Signed 

## 2010-05-10 NOTE — Cardiovascular Report (Signed)
Summary: Pre Cath Orders  Pre Cath Orders   Imported By: Sallee Provencal 05/19/2009 12:22:51  _____________________________________________________________________  External Attachment:    Type:   Image     Comment:   External Document

## 2010-05-10 NOTE — Consult Note (Signed)
Summary: [place]   Imported By: Marilynne Drivers 06/16/2009 13:04:47  _____________________________________________________________________  External Attachment:    Type:   Image     Comment:   External Document

## 2010-05-10 NOTE — Medication Information (Signed)
Summary: Coumadin Clinic  Anticoagulant Therapy  Managed by: Alinda Deem, PharmD, BCPS, CPP Referring MD: [name] MD PCP: dr Bryn Gulling MD: Aundra Dubin MD, Kirk Ruths Indication 1: Mitral Valve Replacement (ICD-V43.3) Indication 2: TIA (ICD-435.9) Lab Used: Home Monitoring  [place] Site: [place] INR POC 3.5 INR RANGE 2.5 - 3.5  Dietary changes: no    Health status changes: no    Bleeding/hemorrhagic complications: no    Recent/future hospitalizations: no    Any changes in medication regimen? no    Recent/future dental: no  Any missed doses?: no       Is patient compliant with meds? yes       Allergies (verified): No Known Drug Allergies  Anticoagulation Management History:      His anticoagulation is being managed by telephone today.  Negative risk factors for bleeding include an age less than 33 years old.  The bleeding index is 'low risk'.  Positive CHADS2 values include History of CHF and History of HTN.  Negative CHADS2 values include Age > 5 years old.  The start date was 04/10/1997.  His last INR was 2.9 ratio.  Anticoagulation responsible provider: Aundra Dubin MD, Gurtaj Ruz.  INR POC: 3.5.  Exp: 05/2010.    Anticoagulation Management Assessment/Plan:      The patient's current anticoagulation dose is Coumadin 10 mg  tabs: use as directed by coumadin clinic.  The target INR is 2.5 - 3.5.  The next INR is due 05/31/2009.  Anticoagulation instructions were given to patient.  Results were reviewed/authorized by Alinda Deem, PharmD, BCPS, CPP.  He was notified by Alinda Deem PharmD, BCPS, CPP.         Prior Anticoagulation Instructions: INR 2.5  Return to normal dosing of warfarin of 10 mg daily.    Call clinic at [phone] to finalize date of procedure, so that we can get switched back over to arixtra at the appropriate time.    Current Anticoagulation Instructions: INR 3.5  Continue 10 mg daily.  Recheck in 1 week.

## 2010-05-10 NOTE — Cardiovascular Report (Signed)
Summary: Pre Cath Orders   Pre Cath Orders   Imported By: Sallee Provencal 05/07/2009 16:51:23  _____________________________________________________________________  External Attachment:    Type:   Image     Comment:   External Document

## 2010-05-10 NOTE — Miscellaneous (Signed)
Summary: Advanced Written Confirmation of Verbal Orders   Advanced Written Confirmation of Verbal Orders   Imported By: Sallee Provencal 10/19/2009 14:24:08  _____________________________________________________________________  External Attachment:    Type:   Image     Comment:   External Document

## 2010-05-10 NOTE — Assessment & Plan Note (Signed)
Summary: [place]   Visit Type:  6 month follolw uo Primary Provider:  dr Jimmy Footman  CC:  swelling.  History of Present Illness: 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 He saw Dr. Reine Just recommended nocturnal oxygen and a right left heart catheter. The patient deferred on both as he had found that the interval dietary changes from my last visit to do aggressive low-salt diet and a higher diuretic regime had resulted in significant improvement.  Echo last month showed EF 55% with well functioning MVR. Estimated RVSP was 51.   He has been doing pretty well. He has intermittent significant deterioration in his symptoms. He has not noted any significant palpitations although he has had times when he has not been quite as aware of his heart valve clicking. These are brief period is not clear whether they correlate with his atrial fibrillation episodes as detected by his event recorder.  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 Taking 14 Tablets Daily 2)  Torsemide 20 Mg  Tabs (Torsemide) .... Take 3 Tabs By Mouth Once Daily or As Directed 3)  Furosemide 40  Mg  Tabs (Furosemide) .... 3 Daily Or, As Directed 4)  Diovan Hct 80-12.5 Mg  Tabs (Valsartan-Hydrochlorothiazide) .... Take 1 Tablet By Mouth Once A Day 5)  Coumadin 10 Mg  Tabs (Warfarin Sodium) .... Use As Directed By Coumadin Clinic 6)  Fish Oil 1000 Mg  Caps (Omega-3 Fatty Acids) .... Take 4 Tabs By Mouth Once Daily 7)  Cvs Vitamin C 1000 Mg  Tabs (Ascorbic Acid) .... Take 2 Tabs By Mouth Once Daily 8)  Multivitamins   Tabs (Multiple Vitamin) .... Take 1 Tablet By Mouth Once A Day 9)  Low-Dose Aspirin 81 Mg  Tabs (Aspirin) .... Take 1 Tablet By Mouth Once A Day 10)  Magnesium Oxide 400 Mg  Caps (Magnesium Oxide) .... Take 6 Tablet By Mouth Once A Dayor As Directed 11)  B 12 1,000mg  .... Daily  Allergies (verified): No Known Drug Allergies  Past History:  Past Medical History: Last updated: 02/16/2010 Current Problems:  S/P Mitral valve replacement S/P Aortic valve repair Hypertrophic Cardiomyopathy  with prior myectomy 1999 in [place], [place]    --cardiac cath in 1999 (pre-op): no CAD CHF -Diasystolic ATRIAL ARRHYTHMIAS -multiple noted at [facility] at [place] (ICD-426.11) MORBID OBESITY (ICD-278.01) MELANOMA, HX OF (ICD-V10.82) HYPERTENSION (ICD-401.9) status post implant [employer] ACCENT DR RF     Past Surgical History: Last updated: 02/16/2010 Mitral Valve replacement/Aortic Valve repair in April 1999 Surgical Myectomy for hypertrophic cardiomyopathy Colonoscopy  Family History: Last updated: 20-Jul-2008 Father: Deceased-Age 69 colon cancer Mother: living, reportedly healthy Siblings:  Sister living reportedly healthy, one sister with severe obesity and heart problems.  One brother good health with history of ablation.  Social History: Last updated: 07/17/2008 Full Time-[employer]  Tobacco Use - Former. Quit 22+ yrs Alcohol Use - yes-occasionally  Risk Factors: Smoking Status: quit (07/17/2008)  Vital Signs:  Patient profile:   65 year old  male Height:      72 inches Weight:      361.50 pounds BMI:     49.21 Pulse rate:   71 / minute BP sitting:   158 / 72  (left arm) Cuff size:   large  Vitals Entered By: Hansel Feinstein CMA (February 17, 2010 4:06 PM)  Physical Exam  General:  The patient was alert and oriented in no acute distress clinical he is morbidly obese HEENT Normal.  Neck veins were flat, carotids were brisk.  Lungs were clear.  Heart sounds were regular without murmurs or gallops.  Abdomen was soft with active bowel sounds. There is no clubbing cyanosis ; right  greater than left edema Skin Warm and dry    PPM Specifications Following MD:  [name], MD     PPM Vendor:  [employer]     PPM Model Number:  [id number]     PPM Serial Number:  [id number] PPM DOI:  07/22/2008     PPM Implanting MD:  [name], MD  Lead 1    Location: [facility]     DOI: 07/22/2008     Model #: [id number]     Serial #: [id number]     Status: active Lead 2    Location: RV     DOI: 07/22/2008     Model #: [id number]     Serial #: [id number]     Status: active  Magnet Response Rate:  BOL 100 ERI 85  Indications:  CHF; HOCM   PPM Follow Up Pacer Dependent:  No      Episodes Coumadin:  Yes  Parameters Mode:  DDD     Lower Rate Limit:  60     Upper Rate Limit:  125 Paced AV Delay:  200     Sensed AV Delay:  200  Impression & Recommendations:  Problem # 1:  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 I've asked that he followup with his primary care physician regarding gout prophylaxis.  Problem # 2:  ATRIAL ARRHYTHMIAS (ICD-427.89) His recurrent atrial arrhythmias. There is a discordance between his ANS counter andAT/AF burden log which I don't understand. We will begin in touch with [employer] to try to help clarify this. Interestingly however the one week that he had a half as defined by his brother in law and corresponded with a weak that he felt really lousy. This would be expected with his hypertrophic  cardiomyopathy. We will try and use a timedate record which he keeps and see how correlates with his AF log to decide whether to initiate antiarrhythmic therapy. He is appropriately on Coumadin. The following medications were removed from the medication list:    Arixtra 10 Mg/0.2ml Soln (Fondaparinux sodium) ..... Inject 1 syringe subcutaneously daily at 0700 am daily on 1/27 - 1/30.  restart injections daily on 2/1 as directed. His updated medication list for this problem includes:    Coumadin 10 Mg Tabs (Warfarin sodium) ..... Use as directed by coumadin clinic    Low-dose Aspirin 81 Mg Tabs (Aspirin) .Marland Kitchen... Take 1 tablet by mouth once a day  Orders:  TLB-BMP (Basic Metabolic Panel-BMET) (99991111) TLB-Magnesium (Mg) (83735-MG)  Problem # 3:  DIASTOLIC HEART FAILURE, CHRONIC (ICD-428.32) stable his medications are refilled. There is some unilateral edema. The following medications were removed from the medication list:    Arixtra 10 Mg/0.61ml Soln (Fondaparinux sodium) ..... Inject 1 syringe subcutaneously daily at 0700 am daily on 1/27 - 1/30.  restart injections daily on 2/1 as directed. His updated medication list for this problem includes:    Torsemide 20 Mg Tabs (Torsemide) .Marland Kitchen... Take 3 tabs by mouth once daily or as directed    Furosemide 40 Mg Tabs (Furosemide) .Marland KitchenMarland KitchenMarland KitchenMarland Kitchen 3 daily or, as directed    Diovan Hct 80-12.5 Mg Tabs (Valsartan-hydrochlorothiazide) .Marland Kitchen... Take 1 tablet by mouth once a day    Coumadin 10 Mg Tabs (Warfarin sodium) ..... Use as directed by coumadin clinic    Low-dose Aspirin 81 Mg Tabs (Aspirin) .Marland Kitchen... Take 1 tablet by mouth once a day  Orders: TLB-BMP (Basic Metabolic Panel-BMET) (99991111) TLB-Magnesium (Mg) (83735-MG)  Problem # 4:  CARDIOMYOPATHY, HYPERTROPHIC (ICD-425.1) as above  Problem # 5:  VALVULAR HEART DISEASE (ICD-424.90) The fact that he cannot hear his out of click although time is concerning. We will again try and track this with his dental  history as that may be related to rapid valve closure to the point that its  being muffled by frequency  Problem # 6:  PACEMAKER, STJ DDD (ICD-V45.01) Device parameters and data were reviewed and no changes were made  Patient Instructions: 1)  Your physician recommends that you continue on your current medications as directed. Please refer to the Current Medication list given to you today. 2)  Your physician wants you to follow-up in:  6 months with [facility] and Dr. [name]. You will receive a reminder letter in the mail two months in advance. If you don't receive a letter, please call our office to schedule the follow-up appointment. Prescriptions: FUROSEMIDE 40 MG  TABS (FUROSEMIDE) 3 daily or, as directed  #270 x 3   Entered by:   Joan Flores RN   Authorized by:   Nikki Dom, MD, [facility]   Signed by:   Joan Flores RN on 02/17/2010   Method used:   Print then Give to Patient   RxID:   [id number] TORSEMIDE 20 MG  TABS (TORSEMIDE) take 3 tabs by mouth once daily or as directed  #270 x 3   Entered by:   Joan Flores RN   Authorized by:   Nikki Dom, MD, [facility]   Signed by:   Joan Flores RN on 02/17/2010   Method used:   Print then Give to Patient   RxID:   [id number] KLOR-CON 10 10 MEQ CR-TABS (POTASSIUM CHLORIDE) taking 14 tablets daily  #1260 x 3   Entered by:   Joan Flores RN   Authorized by:   Nikki Dom, MD, [facility]   Signed by:   Joan Flores RN on 02/17/2010   Method used:   Print then Give to Patient   RxID:   [id number] DIOVAN HCT 80-12.5 MG  TABS (VALSARTAN-HYDROCHLOROTHIAZIDE) Take 1 tablet by mouth once a day  #90 x 3   Entered by:   Joan Flores RN   Authorized by:   Nikki Dom, MD, [facility]   Signed by:   Joan Flores RN on 02/17/2010   Method used:   Print then Give to Patient   [id number] COUMADIN 10 MG  TABS (WARFARIN SODIUM) use as  directed by coumadin clinic  #90 x 3    Entered by:   Joan Flores RN   Authorized by:   Nikki Dom, MD, [facility]   Signed by:   Joan Flores RN on 02/17/2010   Method used:   Print then Give to Patient   RxID:   [id number] PREDNISONE (PAK) 10 MG TABS (PREDNISONE) take as directed  #1 x 0   Entered by:   Joan Flores RN   Authorized by:   Nikki Dom, MD, [facility]   Signed by:   Joan Flores RN on 02/17/2010   Method used:   Electronically to        [facility] [place] (retail)       [address]       [place], [place]  [address]       Ph: [phone]       Fax: [phone]   RxID:   [id number]

## 2010-05-10 NOTE — Medication Information (Signed)
Summary: Coumadin Clinic  Anticoagulant Therapy  Managed by: Porfirio Oar, PharmD Referring MD: [name] MD PCP: dr Bryn Gulling MD: Ron Parker MD, Dellis Filbert Indication 1: Mitral Valve Replacement (ICD-V43.3) Indication 2: TIA (ICD-435.9) Lab Used: Home Monitoring  [place] Site: [place] INR POC 3.4 INR RANGE 2.5 - 3.5  Dietary changes: no    Health status changes: yes       Details: is postponing colonoscopy to later this summer.   Bleeding/hemorrhagic complications: no    Recent/future hospitalizations: no    Any changes in medication regimen? no    Recent/future dental: no  Any missed doses?: no       Is patient compliant with meds? yes       Allergies: No Known Drug Allergies  Anticoagulation Management History:      His anticoagulation is being managed by telephone today.  Negative risk factors for bleeding include an age less than 68 years old.  The bleeding index is 'low risk'.  Positive CHADS2 values include History of CHF and History of HTN.  Negative CHADS2 values include Age > 65 years old.  The start date was 04/10/1997.  His last INR was 2.3 ratio.  Anticoagulation responsible provider: Ron Parker MD, Dellis Filbert.  INR POC: 3.4.  Exp: 11/2010.    Anticoagulation Management Assessment/Plan:      The patient's current anticoagulation dose is Coumadin 10 mg  tabs: use as directed by coumadin clinic.  The target INR is 2.5 - 3.5.  The next INR is due 09/16/2009.  Anticoagulation instructions were given to patient.  Results were reviewed/authorized by Porfirio Oar, PharmD.  He was notified by Porfirio Oar PharmD.         Prior Anticoagulation Instructions: INR 2.4  Pt took extra 1/2 tablet yesterday.  Will take last dose of Coumadin on Wednesday, nothing on Thursday and start Arixtra on Friday.  Will recheck INR after procedure   Current Anticoagulation Instructions: INR 3.4  LMOM for pt.  Continue same dose of 5mg  daily.  Pt is instructed to call with any changes or  questions.  Recheck INR in 1-2 weeks.

## 2010-05-10 NOTE — Medication Information (Signed)
Summary: [email or web address]  Anticoagulant Therapy  Managed by: Alinda Deem, PharmD, BCPS, CPP Referring MD: [name] MD PCP: dr Bryn Gulling MD: Johnsie Cancel MD, Collier Salina Indication 1: Mitral Valve Replacement (ICD-V43.3) Indication 2: TIA (ICD-435.9) Lab Used: Home Monitoring  [place] Site: [place] INR POC 2.5 INR RANGE 2.5 - 3.5  Dietary changes: no    Health status changes: no    Bleeding/hemorrhagic complications: no    Recent/future hospitalizations: no    Any changes in medication regimen? no    Recent/future dental: no  Any missed doses?: no       Is patient compliant with meds? yes       Current Medications (verified): 1)  Potassium Chloride Crys Cr 20 Meq Cr-Tabs (Potassium Chloride Crys Cr) .... 4 Tabs Four Times A Day or As Needed 2)  Torsemide 20 Mg  Tabs (Torsemide) .... Take 2 Tabs By Mouth Once Daily or As Directed 3)  Furosemide 40 Mg  Tabs (Furosemide) .... As Directed 4)  Diovan Hct 80-12.5 Mg  Tabs (Valsartan-Hydrochlorothiazide) .... Take 1 Tablet By Mouth Once A Day 5)  Coumadin 10 Mg  Tabs (Warfarin Sodium) .... Use As Directed By Coumadin Clinic 6)  Fish Oil 1000 Mg  Caps (Omega-3 Fatty Acids) .... Take 4 Tabs By Mouth Once Daily 7)  Cvs Vitamin C 1000 Mg  Tabs (Ascorbic Acid) .... Take 2 Tabs By Mouth Once Daily 8)  Multivitamins   Tabs (Multiple Vitamin) .... Take 1 Tablet By Mouth Once A Day 9)  Low-Dose Aspirin 81 Mg  Tabs (Aspirin) .... Take 1 Tablet By Mouth Once A Day 10)  Magnesium Oxide 400 Mg  Caps (Magnesium Oxide) .... Take 6 Tablet By Mouth Once A Dayor As Directed 11)  B 12 1,000mg  .... Daily 12)  Arixtra 10 Mg/0.53ml Soln (Fondaparinux Sodium) .... Inject 1 Syringe Subcutaneously Daily At 0700 Am Daily On 1/27 - 1/30.  Restart Injections Daily On 2/1 As Directed.  Allergies: No Known Drug Allergies  Anticoagulation Management History:      Negative risk factors for bleeding include an age less than 3 years old.  The bleeding index is  'low risk'.  Positive CHADS2 values include History of CHF and History of HTN.  Negative CHADS2 values include Age > 10 years old.  The start date was 04/10/1997.  His last INR was 2.9 ratio.  Anticoagulation responsible provider: Johnsie Cancel MD, Collier Salina.  INR POC: 2.5.  Exp: 05/2010.    Anticoagulation Management Assessment/Plan:      The patient's current anticoagulation dose is Coumadin 10 mg  tabs: use as directed by coumadin clinic.  The target INR is 2.5 - 3.5.  The next INR is due 05/21/2009.  Anticoagulation instructions were given to patient.  Results were reviewed/authorized by Alinda Deem, PharmD, BCPS, CPP.  He was notified by Alinda Deem PharmD, BCPS, CPP.         Prior Anticoagulation Instructions: Take 15 mg for 2 days.  Continue arixtra daily injections.    Recheck Friday morning at 830 am.    Current Anticoagulation Instructions: INR 2.5  Return to normal dosing of warfarin of 10 mg daily.    Call clinic at [phone] to finalize date of procedure, so that we can get switched back over to arixtra at the appropriate time.

## 2010-05-10 NOTE — Medication Information (Signed)
Summary: [email or web address]  Anticoagulant Therapy  Managed by: Alinda Deem, PharmD, BCPS, CPP Referring MD: [name] MD PCP: dr Bryn Gulling MD: Ron Parker MD, Dellis Filbert Indication 1: Mitral Valve Replacement (ICD-V43.3) Indication 2: TIA (ICD-435.9) Lab Used: Home Monitoring  [place] Site: [place] INR POC 1.6 INR RANGE 2.5 - 3.5  Dietary changes: no    Health status changes: no    Bleeding/hemorrhagic complications: no    Recent/future hospitalizations: no    Any changes in medication regimen? no    Recent/future dental: no  Any missed doses?: no       Is patient compliant with meds? yes       Allergies (verified): No Known Drug Allergies  Anticoagulation Management History:      The patient is taking warfarin and comes in today for a routine follow up visit.  Negative risk factors for bleeding include an age less than 7 years old.  The bleeding index is 'low risk'.  Positive CHADS2 values include History of CHF and History of HTN.  Negative CHADS2 values include Age > 83 years old.  The start date was 04/10/1997.  His last INR was 2.9 ratio.  Anticoagulation responsible provider: Ron Parker MD, Dellis Filbert.  INR POC: 1.6.  Cuvette Lot#: [id number].  Exp: 05/2010.    Anticoagulation Management Assessment/Plan:      The patient's current anticoagulation dose is Coumadin 10 mg  tabs: use as directed by coumadin clinic.  The target INR is 2.5 - 3.5.  The next INR is due 05/14/2009.  Anticoagulation instructions were given to patient.  Results were reviewed/authorized by Alinda Deem, PharmD, BCPS, CPP.  He was notified by Alinda Deem PharmD, BCPS, CPP.         Prior Anticoagulation Instructions: Hold warfarin starting today.  On Sunday, 1/30, restart warfarin at 15 mg = 1.5 tabs daily.    Start arixtra 10 mg daily on 05/06/2009.  Give 1 dose daily at 0700.  Take dose on 1/30 no later than 0730 am.    Call with problems or issues.    Current Anticoagulation Instructions: Take 15 mg for 2  days.  Continue arixtra daily injections.    Recheck Friday morning at 830 am.

## 2010-05-10 NOTE — Medication Information (Signed)
Summary: Coumadin Clinic  Anticoagulant Therapy  Managed by: [name], RN, BSN Referring MD: [name] MD PCP: dr Bryn Gulling MD: Verl Blalock MD, Marcello Moores Indication 1: Mitral Valve Replacement (ICD-V43.3) Indication 2: TIA (ICD-435.9) Lab Used: Home Monitoring  [place] Site: [place] INR POC 3.3 INR RANGE 2.5 - 3.5  Dietary changes: no    Health status changes: no    Bleeding/hemorrhagic complications: no    Recent/future hospitalizations: no    Any changes in medication regimen? no    Recent/future dental: no  Any missed doses?: no       Is patient compliant with meds? yes      Comments: Self-tested on 10/12.  Allergies: No Known Drug Allergies  Anticoagulation Management History:      His anticoagulation is being managed by telephone today.  Negative risk factors for bleeding include an age less than 35 years old.  The bleeding index is 'low risk'.  Positive CHADS2 values include History of CHF and History of HTN.  Negative CHADS2 values include Age > 1 years old.  The start date was 04/10/1997.  His last INR was 2.3 ratio.  Anticoagulation responsible provider: Verl Blalock MD, Marcello Moores.  INR POC: 3.3.  Exp: 11/2010.    Anticoagulation Management Assessment/Plan:      The patient's current anticoagulation dose is Coumadin 10 mg  tabs: use as directed by coumadin clinic.  The target INR is 2.5 - 3.5.  The next INR is due 01/26/2010.  Anticoagulation instructions were given to patient.  Results were reviewed/authorized by [name], RN, BSN.  He was notified by [name] RN.         Prior Anticoagulation Instructions: INR 2.4 Today take 15mg s then resume 10mg s daily. Recheck in one week.   Current Anticoagulation Instructions: INR 3.3  LMOM for pt. Porfirio Oar PharmD  January 20, 2010 9:08 AM   Spoke with pt advised to continue taking 10mg  daily.  Recheck in 1 week. [name] RN  January 20, 2010 11:24 AM

## 2010-05-10 NOTE — Letter (Signed)
Summary: Cardiac Catheterization Instructions- [place], [place]  [id number] [place]   [place], [place] [address]   Phone: [phone]  Fax: [phone]     05/13/2009 MRN: [id number]  [place], [place]  [address]  Dear Mr. DOERFLINGER,   You are scheduled for a Cardiac Catheterization on  Fri 2/18 with  Dr. [name]  Please arrive to the 1st floor of the Heart and Vascular Center at [facility] at 7:30 am / pm on the day of your procedure. Please do not arrive before 6:30 a.m. Call the Heart and Vascular Center at [phone] if you are unable to make your appointmnet. The Code to get into the parking garage under the building is 0900. Take the elevators to the 1st floor. You must have someone to drive you home. Someone must be with you for the first 24 hours after you arrive home. Please wear clothes that are easy to get on and off and wear slip-on shoes. Do not eat or drink after midnight except water with your medications that morning. Bring all your medications and current insurance cards with you.  _X__ DO NOT take these medications before your procedure:   Torsemide or Furosemide   Alinda Deem will direct you on your coumadin dosing  ___ Make sure you take your aspirin.  ___ You may take ALL of your medications with water that morning. ________________________________________________________________________________________________________________________________  ___ DO NOT take ANY medications before your procedure.  ___ Pre-med instructions:  ________________________________________________________________________________________________________________________________  The usual length of stay after your procedure is 2 to 3 hours. This can vary.  If you have any questions, please call the office at the number listed above.   Kevan Rosebush, RN

## 2010-05-10 NOTE — Assessment & Plan Note (Signed)
Summary: [email or web address] [email or web address]   Primary Provider:  dr Jimmy Footman  CC:  pt sob and feeling as if he is holding a lot more fluid.  He is taking more of his fluid pills but reports he cannot take it as much as he needs it due to travel and work.  History of Present Illness: [name] is seen following pacer implantation for severe 1 AVB in the setting of hypertrophic cardiomyopathy  He was  much improved with vastly increased exercise tolerance.  However, recent months he has had a significant worsening in his overall situation. There have been issues with fluid accumulation. There is also shortness of breath.  He's had no arrhythmias of which is her aware. He has had no changes in his salt intake. There has been no chest discomfort.    Current Medications (verified): 1)  Klor Con 72meq .... Take 2  Tabs  By Mouth Each Morning 3 Tabs  in The Noon and 2 Tabs in The Evening or As Directed 2)  Torsemide 20 Mg  Tabs (Torsemide) .... Take 2 Tabs By Mouth Once Daily or As Directed 3)  Furosemide 40 Mg  Tabs (Furosemide) .... As Directed/ 5 A Day Currently 4)  Diovan Hct 80-12.5 Mg  Tabs (Valsartan-Hydrochlorothiazide) .... Take 1 Tablet By Mouth Once A Day 5)  Coumadin 10 Mg  Tabs (Warfarin Sodium) .... Use As Directed By Coumadin Clinic 6)  Fish Oil 1000 Mg  Caps (Omega-3 Fatty Acids) .... Take 4 Tabs By Mouth Once Daily 7)  Cvs Vitamin C 1000 Mg  Tabs (Ascorbic Acid) .... Take 2 Tabs By Mouth Once Daily 8)  Multivitamins   Tabs (Multiple Vitamin) .... Take 1 Tablet By Mouth Once A Day 9)  Low-Dose Aspirin 81 Mg  Tabs (Aspirin) .... Take 1 Tablet By Mouth Once A Day 10)  Magnesium Oxide 400 Mg  Caps (Magnesium Oxide) .... Take 1 Tablet By Mouth Once A Dayor As Directed 11)  B 12 1,000mg  .... 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 Hypertrophic  Cardiomyopathy  with prior myectomy CHF -Diasystolic ATRIAL ARRHYTHMIAS -multiple noted at [facility] at [place], 1ST DEGREE (ICD-426.11) MORBID OBESITY (ICD-278.01) MELANOMA, HX OF (ICD-V10.82) HYPERTENSION (ICD-401.9)     Past Surgical History: Last updated: 11/23/2008 Mitral Valve replacement/Aortic Valve repair in April 1999 Surgical Myectomy for hypertrophic cardiomyopathy  Family History: Last updated: 08/12/2008 Father: Deceased-Age 67 colon cancer Mother: living, reportedly healthy Siblings: Sister living reportedly healthy, one sister with severe obesity and heart problems.  One brother good health with history of ablation.  Social History: Last updated: Aug 12, 2008 Full Time-[employer]  Tobacco Use - Former. Quit 22+ yrs Alcohol Use - yes-occasionally  Vital Signs:  Patient profile:   65 year old male Height:      72 inches Weight:      386 pounds Pulse rate:   73 / minute Pulse rhythm:   regular BP sitting:   150 / 76  (right arm) Cuff size:   large  Vitals Entered By: Doug Sou CMA (April 13, 2009 11:51 AM)  Physical Exam  General:  The patient was alert and oriented in no acute distress. HEENT Normal.  Neck veins were flat, carotids were brisk.  Lungs were clear.  Heart sounds were regular without murmurs or gallops.  Abdomen was soft with active bowel sounds. There is no  clubbing cyanosis  2+ edema Skin Warm and dry    PPM Specifications Following MD:  [name], MD     PPM Vendor:  St Jude     PPM Model Number:  [id number]     PPM Serial Number:  [id number] PPM DOI:  07/22/2008     PPM Implanting MD:  [name], MD  Lead 1    Location: RA     DOI: 07/22/2008     Model #: [id number]     Serial #: [id number]     Status: active Lead 2    Location: RV     DOI: 07/22/2008     Model #: [id number]     Serial #: [id number]     Status: active  Magnet Response Rate:  BOL 100 ERI 85  Indications:  CHF; HOCM   PPM Follow Up Remote Check?   No Battery Voltage:  2.98 V     Battery Est. Longevity:  10 years     Pacer Dependent:  No       PPM Device Measurements Atrium  Amplitude: 3.0 mV, Impedance: 440 ohms, Threshold: 0.75 V at 0.4 msec Right Ventricle  Amplitude: 12 mV, Impedance: 640 ohms, Threshold: 0.625 V at 0.4 msec  Episodes MS Episodes:  3264     Percent Mode Switch:  23%     Coumadin:  Yes Atrial Pacing:  13%     Ventricular Pacing:  87%  Parameters Mode:  DDD     Lower Rate Limit:  60     Upper Rate Limit:  125 Paced AV Delay:  200     Sensed AV Delay:  200 Next Cardiology Appt Due:  07/09/2009 Tech Comments:  No parameter changes.  The patient is c/o over the last few months increasing SOB and fatigue.  23% total time mode swithch, the longest episode 1:48 hours with 7.6%heart rates > 100bpm, + coumadin.  He did have a near syncopal episode this am while walking into work.  He usually stops half way to rest but today he didn't. ROV 4/11 Dr. [name]. Alma Friendly, LPN  January  4, 624THL 12:22 PM   Impression & Recommendations:  Problem # 1:  ATRIAL ARRHYTHMIAS (ICD-427.89) Interrogation of his device suggests that there has been a significant increase in his overall atrial arrhythmia burden the back to the beginning of November. It comprises 30-70% of his weeks. However, on interrogation the electrograms, there was no evidence of atrial arrhythmia that I could see on an AMS and treat like to branch. This will be reviewed with [employer]. Per interrogation also said that the atrial sensitivity should be adequate to identify atrial high rate episodes and the colectomy and storage has been reprogrammed to facil His updated medication list for this problem includes:    Coumadin 10 Mg Tabs (Warfarin sodium) ..... Use as directed by coumadin clinic    Low-dose Aspirin 81 Mg Tabs (Aspirin) .Marland Kitchen... Take 1 tablet by mouth once a day  Problem # 2:  DIASTOLIC HEART FAILURE, ACUTE ON CHRONIC (ICD-428.33) his heart failure symptoms are  clearly worse. His O2 sat today was 90. Will obtain a chest x-ray as well as a BNP as well as metabolic profile. We'll continue to seek plan to see him in 2 weeks' time. His diuretics are limited by his job situation.  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 There is some concern that pacing may have had an impact on his  left ventricular function. Will obtain a 2-D echo to look at this. His updated medication list for this problem includes:    Coumadin 10 Mg Tabs (Warfarin sodium) ..... Use as directed by coumadin clinic    Low-dose Aspirin 81 Mg Tabs (Aspirin) .Marland Kitchen... 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  Echocardiography is a painless test that uses sound waves to create images of your heart. It provides your doctor with information about the size and shape of your heart and how well your heart's chambers and valves are working.  This procedure takes approximately one hour. There are no restrictions for this procedure. 3)  A chest x-ray takes a picture of the organs and structures inside the chest, including the heart, lungs, and blood vessels. This test can show several things, including, whether the heart is enlarged; whether fluid is building up in the lungs; and whether pacemaker / defibrillator leads are still in place. 4)  Your physician recommends that you schedule a follow-up appointment in: 2 weeks with Dr [name].

## 2010-05-10 NOTE — Medication Information (Signed)
Summary: Coumadin Clinic  Anticoagulant Therapy  Managed by: [name], RN, BSN Referring MD: [name] MD PCP: dr Bryn Gulling MD: Aundra Dubin MD, [place] Indication 1: Mitral Valve Replacement (ICD-V43.3) Indication 2: TIA (ICD-435.9) Lab Used: Home Monitoring  [place] Site: [place] INR POC 2.4 INR RANGE 2.5 - 3.5  Dietary changes: yes       Details: Due to cold hasn't been eating alot mostly drinking juices.   Health status changes: yes       Details: Has a cold   Bleeding/hemorrhagic complications: no    Recent/future hospitalizations: no    Any changes in medication regimen? no    Recent/future dental: no  Any missed doses?: no       Is patient compliant with meds? yes      Comments: Self-tester  Allergies: No Known Drug Allergies  Anticoagulation Management History:      His anticoagulation is being managed by telephone today.  Negative risk factors for bleeding include an age less than 30 years old.  The bleeding index is 'low risk'.  Positive CHADS2 values include History of CHF and History of HTN.  Negative CHADS2 values include Age > 101 years old.  The start date was 04/10/1997.  His last INR was 2.3 ratio.  Anticoagulation responsible Hermena Swint: Aundra Dubin MD, [place].  INR POC: 2.4.    Anticoagulation Management Assessment/Plan:      The patient's current anticoagulation dose is Coumadin 10 mg  tabs: use as directed by coumadin clinic.  The target INR is 2.5 - 3.5.  The next INR is due 01/06/2010.  Anticoagulation instructions were given to patient.  Results were reviewed/authorized by [name], RN, BSN.  He was notified by [name], RN, BSN.         Prior Anticoagulation Instructions: INR 3.4  LMOM for pt. Porfirio Oar PharmD  December 08, 2009 10:14 AM   Spoke with pt.  Continue same dose of 1 tablet every day.  Recheck INR in 2 weeks.  Porfirio Oar PharmD  December 08, 2009 3:16 PM   Current Anticoagulation Instructions: INR 2.4 Today take 15mg s  then resume 10mg s daily. Recheck in one week.

## 2010-05-10 NOTE — Progress Notes (Signed)
Summary: Call to pharmacy  Phone Note Outgoing Call   Call placed by: Alvis Lemmings, RN, BSN,  May 04, 2009 1:53 PM [facility]: I called the pharmacy to cancel a presciption for Imdur that had been sent to them in error. Initial call taken by: Alvis Lemmings, RN, BSN,  May 04, 2009 1:54 PM

## 2010-05-10 NOTE — Cardiovascular Report (Signed)
Summary: Office Visit   Office Visit   Imported By: Sallee Provencal 04/22/2009 16:15:14  _____________________________________________________________________  External Attachment:    Type:   Image     Comment:   External Document

## 2010-05-10 NOTE — Cardiovascular Report (Signed)
Summary: Office Visit   Office Visit   Imported By: Sallee Provencal 05/06/2009 14:04:02  _____________________________________________________________________  External Attachment:    Type:   Image     Comment:   External Document

## 2010-05-10 NOTE — Medication Information (Signed)
Summary: Coumadin Clinic  Anticoagulant Therapy  Managed by: [name], RN, BSN Referring MD: [name] MD PCP: dr Bryn Gulling MD: Johnsie Cancel MD, Collier [place] Indication 1: Mitral Valve Replacement (ICD-V43.3) Indication 2: TIA (ICD-435.9) Lab Used: Home Monitoring  [place] Site: [place] INR POC 2.7 INR RANGE 2.5 - 3.5           Allergies: No Known Drug Allergies  Anticoagulation Management History:      His anticoagulation is being managed by telephone today.  Negative risk factors for bleeding include an age less than 40 years old.  The bleeding index is 'low risk'.  Positive CHADS2 values include History of CHF and History of HTN.  Negative CHADS2 values include Age > 53 years old.  The start date was 04/10/1997.  His last INR was 3.3 ratio.  Anticoagulation responsible provider: Johnsie Cancel MD, Collier [place].  INR POC: 2.7.  Exp: 02/2010.    Anticoagulation Management Assessment/Plan:      The patient's current anticoagulation dose is Coumadin 10 mg  tabs: use as directed by coumadin clinic.  The target INR is 2.5 - 3.5.  The next INR is due 04/23/2009.  Anticoagulation instructions were given to patient.  Results were reviewed/authorized by [name], RN, BSN.  He was notified by [name] RN.         Prior Anticoagulation Instructions: INR 3.1 pt. is a self-tester, LMOM to call if any changes to med, diet, bleeding or missed dose.  Continue current dose of 10mg s daily. Recheck in one week.   Current Anticoagulation Instructions: INR 2.7  Pt is a self-tester, LMOM to continue on same dosage 10mg  daily, call back with any bleeding problems, new medications, or upcoming procedures.  Recheck PT/INR in 1 week.

## 2010-05-10 NOTE — Medication Information (Signed)
Summary: Coumadin Clinic  Anticoagulant Therapy  Managed by: Porfirio Oar, PharmD Referring MD: [name] MD PCP: dr Bryn Gulling MD: Ron Parker MD, Dellis Filbert Indication 1: Mitral Valve Replacement (ICD-V43.3) Indication 2: TIA (ICD-435.9) Lab Used: Home Monitoring  [place] Site: [place] INR POC 2.4 INR RANGE 2.5 - 3.5  Dietary changes: no    Health status changes: no    Bleeding/hemorrhagic complications: no    Recent/future hospitalizations: no    Any changes in medication regimen? no    Recent/future dental: no  Any missed doses?: no       Is patient compliant with meds? yes       Allergies: No Known Drug Allergies  Anticoagulation Management History:      His anticoagulation is being managed by telephone today.  Negative risk factors for bleeding include an age less than 66 years old.  The bleeding index is 'low risk'.  Positive CHADS2 values include History of CHF and History of HTN.  Negative CHADS2 values include Age > 31 years old.  The start date was 04/10/1997.  His last INR was 2.3 ratio.  Anticoagulation responsible provider: Ron Parker MD, Dellis Filbert.  INR POC: 2.4.  Exp: 11/2010.    Anticoagulation Management Assessment/Plan:      The patient's current anticoagulation dose is Coumadin 10 mg  tabs: use as directed by coumadin clinic.  The target INR is 2.5 - 3.5.  The next INR is due 09/13/2009.  Anticoagulation instructions were given to patient.  Results were reviewed/authorized by Porfirio Oar, PharmD.  He was notified by Porfirio Oar, PharmD.         Prior Anticoagulation Instructions: INR 2.2 Pt sent to lab for venipuncture... 2.3 INR from lab. Pt. to call [facility] and inform them of lab results for machine accuracy.   15mg  today then resume 10mg s daily. Last dose 09/03/09 for procedure on 09/09/09. Has Arixtra instructions.  Check INR on 09/13/09 post procedure for coumadin and Arixtra instructions.     Current Anticoagulation Instructions: INR 2.4  Pt took extra  1/2 tablet yesterday.  Will take last dose of Coumadin on Wednesday, nothing on Thursday and start Arixtra on Friday.  Will recheck INR after procedure

## 2010-05-10 NOTE — Assessment & Plan Note (Signed)
Summary: PC2   Primary Provider:  dr Jimmy Footman  CC:  pacer check.  History of Present Illness: [name] is seen following pacer implantation for severe 1 AVB in the setting of hypertrophic cardiomyopathy  He was  much improved with vastly increased exercise tolerance.  However, recent months he has had a significant worsening in his overall situation. There have been issues with fluid accumulation. there is chronic edema.There is also shortness of breath.  this seems to be progressive.  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  last week he was noted to have normal left ventricular function within normal mechanical mitral valve function   He does have a history of cardiopulmonary stress testing done about a year ago demonstrated a VO2 max of 12.2 representing 66.4% predicted. his VO2 adjusted for body weight was 25.9 His slope was 34 and his O2 pulse was 17, 88% of predicted.  It was felt that his primary limitations were obesity and ventilatory        Current Medications (verified): 1)  Klor Con 68meq .... Take 2  Tabs  By Mouth Each Morning 3 Tabs  in The Noon and 2 Tabs in The Evening or As Directed 2)  Torsemide 20 Mg  Tabs (Torsemide) .... Take 2 Tabs By Mouth Once Daily or As Directed 3)  Furosemide 40 Mg  Tabs (Furosemide) .... As Directed/ 5 A Day Currently 4)  Diovan Hct 80-12.5 Mg  Tabs (Valsartan-Hydrochlorothiazide) .... Take 1 Tablet By Mouth Once A Day 5)  Coumadin 10 Mg  Tabs (Warfarin Sodium) .... Use As Directed By Coumadin Clinic 6)  Fish Oil 1000 Mg  Caps (Omega-3 Fatty Acids) .... Take 4 Tabs By Mouth Once Daily 7)  Cvs Vitamin C 1000 Mg  Tabs (Ascorbic Acid) .... Take 2 Tabs By Mouth Once Daily 8)  Multivitamins   Tabs (Multiple Vitamin) .... Take 1 Tablet By Mouth Once A  Day 9)  Low-Dose Aspirin 81 Mg  Tabs (Aspirin) .... Take 1 Tablet By Mouth Once A Day 10)  Magnesium Oxide 400 Mg  Caps (Magnesium Oxide) .... Take 1 Tablet By Mouth Once A Dayor As Directed 11)  B 12 1,000mg  .... 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 Hypertrophic Cardiomyopathy  with prior myectomy CHF -Diasystolic ATRIAL ARRHYTHMIAS -multiple noted at [facility] at [place], 1ST DEGREE (ICD-426.11) MORBID OBESITY (ICD-278.01) MELANOMA, HX OF (ICD-V10.82) HYPERTENSION (ICD-401.9)     Past Surgical History: Last updated: 11/23/2008 Mitral Valve replacement/Aortic Valve repair in April 1999 Surgical Myectomy for hypertrophic cardiomyopathy  Family History: Last updated: 07-24-2008 Father: Deceased-Age 56 colon cancer Mother: living, reportedly healthy Siblings: Sister living reportedly healthy, one sister with severe obesity and heart problems.  One brother good health with history of ablation.  Social History: Last updated: 2008-07-24 Full Time-[employer]  Tobacco Use - Former. Quit 22+ yrs Alcohol Use - yes-occasionally  Vital Signs:  Patient profile:   65 year old male Height:      72 inches Weight:      376 pounds BMI:     51.18 Pulse rate:   61 / minute Pulse rhythm:   irregular BP  sitting:   152 / 76  (left arm) Cuff size:   large  Vitals Entered By: Doug Sou CMA (May 04, 2009 11:54 AM)  Physical Exam  General:  The patient was alert and oriented in mild respiratory distress; he is morbidly obese HEENT Normal.  Neck veins were flat, carotids were brisk.  Lungs were clear.  Heart sounds were regular with mechanicall S1 Abdomen was soft with active bowel sounds. There is no clubbing cyanosisE.: 2+ edema Skin Warm and dry    EKG  Procedure date:  05/04/2009  Findings:       p  synchronously paced rhythm  PPM Specifications Following MD:  [name], MD     PPM Vendor:  [employer] Jude     PPM Model Number:  [id number]     PPM Serial Number:  [id number] PPM DOI:  07/22/2008     PPM Implanting MD:  [name], MD  Lead 1    Location: RA     DOI: 07/22/2008     Model #: [id number]     Serial #: [id number]     Status: active Lead 2    Location: RV     DOI: 07/22/2008     Model #: [id number]     Serial #: [id number]     Status: active  Magnet Response Rate:  BOL 100 ERI 85  Indications:  CHF; HOCM   PPM Follow Up Remote Check?  No Battery Voltage:  2.98 V     Battery Est. Longevity:  10 years     Pacer Dependent:  No       PPM Device Measurements Atrium  Amplitude: 2.9 mV, Impedance: 480 ohms, Threshold: 0.75 V at 0.4 msec Right Ventricle  Amplitude: 9.0 mV, Impedance: 680 ohms, Threshold: 0.62 V at 0.4 msec  Episodes MS Episodes:  2479     Percent Mode Switch:  33%     Coumadin:  Yes Atrial Pacing:  26%     Ventricular Pacing:  88%  Parameters Mode:  DDD     Lower Rate Limit:  60     Upper Rate Limit:  125 Paced AV Delay:  200     Sensed AV Delay:  200 Tech Comments:  No parameter changes.   Device function normal.  The patient is c/o fatigue and SOB with minimal exertion.  A-fib 33% with 14% heart rates >140bpm.   Alma Friendly, LPN  January 25, 624THL 12:21 PM   Impression & Recommendations:  Problem # 1:  DIASTOLIC HEART FAILURE, ACUTE ON CHRONIC (ICD-428.33)  Frank [name] continues to struggle with poor and worsening exercise performance. There is discordance in the evaluation for heart failure given his BNP that was normal and his chest x-ray that showed vascular congestion. I suspect that he does have diastolic heart failure as a major contributor here. With his obesity however pulmonary hypertension and hypoxia hypoventilation may be contributing also. He has a history of open-heart surgery with replacement of his mitral valve. Constriction may be also contributing to his  physiology.  I reviewed these issues with Dr. 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 Take one tablet by mouth daily His updated medication list for this problem includes:    Coumadin 10 Mg Tabs (Warfarin sodium) ..... Use as directed by coumadin clinic    Low-dose Aspirin 81 Mg Tabs (Aspirin) .Marland Kitchen... Take 1 tablet by mouth once a day Prescriptions: ISOSORBIDE MONONITRATE CR 30 MG XR24H-TAB (ISOSORBIDE MONONITRATE) Take one tablet by mouth daily  #90 x 3   Entered by:   Alvis Lemmings, RN, BSN   Authorized by:   Nikki Dom, MD, [facility]   Signed by:   Alvis Lemmings, RN, BSN on 05/04/2009   Method used:   Electronically to        [facility] [phone]* (retail)       [address]       [place], [place]  [address]       Ph: [phone] or [phone]       Fax: [phone]   RxID:   [id number]

## 2010-05-10 NOTE — Medication Information (Signed)
Summary: Coumadin Clinic  Anticoagulant Therapy  Managed by: Porfirio Oar, PharmD Referring MD: [name] MD PCP: dr Bryn Gulling MD: Ron Parker MD, Dellis Filbert Indication 1: Mitral Valve Replacement (ICD-V43.3) Indication 2: TIA (ICD-435.9) Lab Used: Home Monitoring  [place] Site: [place] INR POC 3.4 INR RANGE 2.5 - 3.5  Dietary changes: no    Health status changes: no    Bleeding/hemorrhagic complications: yes       Details: still some bruising and knots from Arixtra injections  Recent/future hospitalizations: no    Any changes in medication regimen? no    Recent/future dental: no  Any missed doses?: no       Is patient compliant with meds? yes       Allergies: No Known Drug Allergies  Anticoagulation Management History:      His anticoagulation is being managed by telephone today.  Negative risk factors for bleeding include an age less than 43 years old.  The bleeding index is 'low risk'.  Positive CHADS2 values include History of CHF and History of HTN.  Negative CHADS2 values include Age > 62 years old.  The start date was 04/10/1997.  His last INR was 2.3 ratio.  Anticoagulation responsible provider: Ron Parker MD, Dellis Filbert.  INR POC: 3.4.  Exp: 11/2010.    Anticoagulation Management Assessment/Plan:      The patient's current anticoagulation dose is Coumadin 10 mg  tabs: use as directed by coumadin clinic.  The target INR is 2.5 - 3.5.  The next INR is due 12/22/2009.  Anticoagulation instructions were given to patient.  Results were reviewed/authorized by Porfirio Oar, PharmD.  He was notified by Porfirio Oar PharmD.         Prior Anticoagulation Instructions: INR 2.3  Stop Arixtra today. Continue taking Coumadin 1 tab (10 mg) every day.  Recheck next Wednesday.   Current Anticoagulation Instructions: INR 3.4  LMOM for pt. Porfirio Oar PharmD  December 08, 2009 10:14 AM   Spoke with pt.  Continue same dose of 1 tablet every day.  Recheck INR in 2 weeks.  Porfirio Oar  PharmD  December 08, 2009 3:16 PM

## 2010-05-10 NOTE — Medication Information (Signed)
Summary: Coumadin Clinic  Anticoagulant Therapy  Managed by: [name], RN, BSN Referring MD: [name] MD PCP: dr Bryn Gulling MD: Olevia Perches MD, Ortha Metts Indication 1: Mitral Valve Replacement (ICD-V43.3) Indication 2: TIA (ICD-435.9) Lab Used: Home Monitoring  [place] Site: [place] INR POC 3.5 INR RANGE 2.5 - 3.5    Bleeding/hemorrhagic complications: no     Any changes in medication regimen? no     Any missed doses?: no         Allergies: No Known Drug Allergies  Anticoagulation Management History:      His anticoagulation is being managed by telephone today.  Negative risk factors for bleeding include an age less than 77 years old.  The bleeding index is 'low risk'.  Positive CHADS2 values include History of CHF and History of HTN.  Negative CHADS2 values include Age > 33 years old.  The start date was 04/10/1997.  His last INR was 2.9 ratio.  Anticoagulation responsible provider: Olevia Perches MD, Darnell Level.  INR POC: 3.5.  Exp: 05/2010.    Anticoagulation Management Assessment/Plan:      The patient's current anticoagulation dose is Coumadin 10 mg  tabs: use as directed by coumadin clinic.  The target INR is 2.5 - 3.5.  The next INR is due 08/09/2009.  Anticoagulation instructions were given to patient.  Results were reviewed/authorized by [name], RN, BSN.  He was notified by [name] RN.         Prior Anticoagulation Instructions: INR 2.8  LM for pt to continue same dose and recheck INR in 1-2 weks. Porfirio Oar, Devin Going D  07/06/09- Telephoned pt and he answered, denies any changes or concerns. He is aware to recheck in one week. [name], RN, BSN  July 06, 2009 3:36 PM   Current Anticoagulation Instructions: INR 3.5  Spoke with pt.  Pt self-tests coumadin at home.  Advised to continue on same dosage 10mg  daily and recheck INR in 1-2 weeks.

## 2010-05-10 NOTE — Letter (Signed)
Summary: [employer]   Imported By: Marilynne Drivers 12/20/2009 14:38:51  _____________________________________________________________________  External Attachment:    Type:   Image     Comment:   External Document

## 2010-05-10 NOTE — Progress Notes (Signed)
Summary: CALLING TO CANCEL PROCEDURE ON 05/28/09  Phone Note Call from Patient Call back at [facility] Phone [phone]   Caller: Patient Summary of Call: PT WANT CANCEL HIS PROCEDURE ON THE 05/28/09 Initial call taken by: Delsa Sale,  May 17, 2009 12:37 PM  Follow-up for Phone Call        Left message to call back Kevan Rosebush, RN  May 17, 2009 1:02 PM   Left message to call back Kevan Rosebush, RN  May 18, 2009 10:13 AM   spoke w/pt, cath cx'd Dr [name] aware Kevan Rosebush, RN  May 18, 2009 11:35 AM

## 2010-05-10 NOTE — Medication Information (Signed)
Summary: Coumadin Clinic  Anticoagulant Therapy  Managed by: Porfirio Oar, PharmD Referring MD: [name] MD PCP: dr Bryn Gulling MD: Burt Knack MD, Legrand Como Indication 1: Mitral Valve Replacement (ICD-V43.3) Indication 2: TIA (ICD-435.9) Lab Used: Home Monitoring  [place] Site: [place] INR POC 2.5 INR RANGE 2.5 - 3.5  Dietary changes: no    Health status changes: no    Bleeding/hemorrhagic complications: no    Recent/future hospitalizations: no    Any changes in medication regimen? no    Recent/future dental: no  Any missed doses?: no       Is patient compliant with meds? yes       Allergies: No Known Drug Allergies  Anticoagulation Management History:      His anticoagulation is being managed by telephone today.  Negative risk factors for bleeding include an age less than 59 years old.  The bleeding index is 'low risk'.  Positive CHADS2 values include History of CHF and History of HTN.  Negative CHADS2 values include Age > 34 years old.  The start date was 04/10/1997.  His last INR was 2.9 ratio.  Anticoagulation responsible provider: Burt Knack MD, Legrand Como.  INR POC: 2.5.  Exp: 05/2010.    Anticoagulation Management Assessment/Plan:      The patient's current anticoagulation dose is Coumadin 10 mg  tabs: use as directed by coumadin clinic.  The target INR is 2.5 - 3.5.  The next INR is due 08/16/2009.  Anticoagulation instructions were given to patient.  Results were reviewed/authorized by Porfirio Oar, PharmD.  He was notified by Porfirio Oar PharmD.         Prior Anticoagulation Instructions: INR 3.5  Spoke with pt.  Pt self-tests coumadin at home.  Advised to continue on same dosage 10mg  daily and recheck INR in 1-2 weeks.    Current Anticoagulation Instructions: INR 2.5  Spoke with pt.  Continue same dose of 1 tablet every day. Recheck INR in 1-2 weeks

## 2010-05-10 NOTE — Progress Notes (Signed)
Summary: oxygen  Phone Note Call from Patient Call back at Home Phone [phone]   Caller: Patient Reason for Call: Talk to Doctor Summary of Call: pt states that he does not need oxygen, wants to have [facility] to pick up tank, they will need order Initial call taken by: Darnell Level,  June 17, 2009 8:23 AM  Follow-up for Phone Call        pt will either need to come to our office or have [facility] check his O2 sat w/ambulation to make sure it doesn't drop before we can d/c his O2 order  Left message to call back Kevan Rosebush, RN  June 17, 2009 2:56 PM   Additional Follow-up for Phone Call Additional follow up Details #1::        SPOKE WITH PT RE O2. IS REFUSING  O2 THERAPY.WANTS IT REMOVED INSTRUCTED THE NEED TO HAVE 02 SAT CHECKED WITH AMBULATION TO MAKE SURE THE NEED IS NOT THERE IS REFUSING STATED DO I NEED TO GET AN ATTORNEY AND ALSO ADDED ARE YOUR REALLY GOING TO TAKE $200.00 OUT OF MY POCKET INS DOES NOT PAY.SPOKE WITH DR Vearl Aitken  RE PT. PER DR BENSIMOHN MAY HAVE O2 REMOVED BUT FEELS PT WILL DO POORLY WITHOUT O2  WILL NOTIFY [facility]  TO REMOVE Additional Follow-up by: Devra Dopp, LPN,  March 11, 624THL 3:19 PM    Additional Follow-up for Phone Call Additional follow up Details #2::    He needs O2 but it is his prerogative to refuse. However must realize that he probably will get worse without it. Jolaine Artist, MD, [facility]  June 18, 2009 3:36 PM

## 2010-05-10 NOTE — Medication Information (Signed)
Summary: need to talk about bridging with arixtra  Anticoagulant Therapy  Managed by: [name], RN, BSN Referring MD: [name] MD PCP: dr Bryn Gulling MD: Verl Blalock MD, Marcello Moores Indication 1: Mitral Valve Replacement (ICD-V43.3) Indication 2: TIA (ICD-435.9) Lab Used: Home Monitoring  [place] Site: [place] INR POC 2.9 INR RANGE 2.5 - 3.5  Dietary changes: no    Health status changes: no    Bleeding/hemorrhagic complications: no    Recent/future hospitalizations: no    Any changes in medication regimen? no    Recent/future dental: no  Any missed doses?: no       Is patient compliant with meds? yes      Comments: Pending Colonoscopy in June  Allergies: No Known Drug Allergies  Anticoagulation Management History:      The patient is taking warfarin and comes in today for a routine follow up visit.  Negative risk factors for bleeding include an age less than 8 years old.  The bleeding index is 'low risk'.  Positive CHADS2 values include History of CHF and History of HTN.  Negative CHADS2 values include Age > 9 years old.  The start date was 04/10/1997.  His last INR was 2.9 ratio.  Anticoagulation responsible provider: Verl Blalock MD, Marcello Moores.  INR POC: 2.9.  Cuvette Lot#: [id number].  Exp: 09/2010.    Anticoagulation Management Assessment/Plan:      The patient's current anticoagulation dose is Coumadin 10 mg  tabs: use as directed by coumadin clinic.  The target INR is 2.5 - 3.5.  The next INR is due 09/15/2009.  Anticoagulation instructions were given to patient.  Results were reviewed/authorized by [name], RN, BSN.  He was notified by [name], RN, BSN.         Prior Anticoagulation Instructions: INR 2.5  Spoke with pt.  Continue same dose of 1 tablet every day. Recheck INR in 1-2 weeks   Current Anticoagulation Instructions: INR 2.9 Last dose of coumadin 09/03/09, start Arixtra on 09/05/09 daily injection in morning. Last injection on 09/08/09. Procedure on  09/09/09. Restart per GI instructions coumadin and Arixtra.  Prescriptions: ARIXTRA 10 MG/0.8ML SOLN (FONDAPARINUX SODIUM) Inject 1 syringe subcutaneously daily at 0700 am daily on 1/27 - 1/30.  Restart injections daily on 2/1 as directed.  #10 x 1   Entered by:   [name], RN, BSN   Authorized by:   Nikki Dom, MD, [facility]   Signed by:   [name], RN, BSN on 08/10/2009   Method used:   Electronically to        [facility]. [address] [phone]* (retail)       [address]       [place], [place]  [address]       Ph: [phone] or [phone]       Fax: [phone]   RxID:   [id number]

## 2010-05-10 NOTE — Cardiovascular Report (Signed)
Summary: Office VIsit   Office VIsit   Imported By: Sallee Provencal 02/21/2010 10:46:13  _____________________________________________________________________  External Attachment:    Type:   Image     Comment:   External Document

## 2010-05-10 NOTE — Medication Information (Signed)
Summary: Coumadin Clinic  Anticoagulant Therapy  Managed by: [name], RN, BSN Referring MD: [name] MD PCP: dr Bryn Gulling MD: Aundra Dubin MD, Rosiland Sen Indication 1: Mitral Valve Replacement (ICD-V43.3) Indication 2: TIA (ICD-435.9) Lab Used: Home Monitoring  [place] Site: [place] INR POC 4.0 INR RANGE 2.5 - 3.5  Dietary changes: no    Health status changes: no    Bleeding/hemorrhagic complications: no    Recent/future hospitalizations: no    Any changes in medication regimen? no    Recent/future dental: no  Any missed doses?: no       Is patient compliant with meds? yes      Comments: Low sodium diet and recently increased diuretic. Denies fluid retention at present.   Allergies: No Known Drug Allergies  Anticoagulation Management History:      His anticoagulation is being managed by telephone today.  Negative risk factors for bleeding include an age less than 66 years old.  The bleeding index is 'low risk'.  Positive CHADS2 values include History of CHF and History of HTN.  Negative CHADS2 values include Age > 9 years old.  The start date was 04/10/1997.  His last INR was 2.9 ratio.  Anticoagulation responsible provider: Aundra Dubin MD, Marshall Roehrich.  INR POC: 4.0.    Anticoagulation Management Assessment/Plan:      The patient's current anticoagulation dose is Coumadin 10 mg  tabs: use as directed by coumadin clinic.  The target INR is 2.5 - 3.5.  The next INR is due 07/05/2009.  Anticoagulation instructions were given to patient.  Results were reviewed/authorized by [name], RN, BSN.  He was notified by [name], RN, BSN.         Prior Anticoagulation Instructions: INR 3.5 Continue 10mg s daily. Recheck in one week. Pt aware.   Current Anticoagulation Instructions: INR 4.0 Pt. states last night due to read he took 5mg s, thus instructed him to take 5mg s today as well. Recheck INR in one week.

## 2010-05-10 NOTE — Medication Information (Signed)
Summary: rov/sp  Anticoagulant Therapy  Managed by: Porfirio Oar, PharmD Referring MD: [name] MD PCP: dr Bryn Gulling MD: Rayann Heman MD, Jeneen Rinks Indication 1: Mitral Valve Replacement (ICD-V43.3) Indication 2: TIA (ICD-435.9) Lab Used: Home Monitoring  [place] Site: [place] INR POC 1.5 INR RANGE 2.5 - 3.5  Dietary changes: no    Health status changes: no    Bleeding/hemorrhagic complications: no    Recent/future hospitalizations: yes       Details: recently had colonoscopy.  Has been on Arixtra x 2 weeks.  Restarted Coumadin on Saturday.   Any changes in medication regimen? no    Recent/future dental: no  Any missed doses?: no       Is patient compliant with meds? yes       Allergies: No Known Drug Allergies  Anticoagulation Management History:      The patient is taking warfarin and comes in today for a routine follow up visit.  Negative risk factors for bleeding include an age less than 68 years old.  The bleeding index is 'low risk'.  Positive CHADS2 values include History of CHF and History of HTN.  Negative CHADS2 values include Age > 62 years old.  The start date was 04/10/1997.  His last INR was 2.3 ratio.  Anticoagulation responsible Neesha Langton: Allred MD, Jeneen Rinks.  INR POC: 1.5.  Exp: 11/2010.    Anticoagulation Management Assessment/Plan:      The patient's current anticoagulation dose is Coumadin 10 mg  tabs: use as directed by coumadin clinic.  The target INR is 2.5 - 3.5.  The next INR is due 12/03/2009.  Anticoagulation instructions were given to patient.  Results were reviewed/authorized by Porfirio Oar, PharmD.  He was notified by Porfirio Oar PharmD.         Prior Anticoagulation Instructions: INR 2.8  LMOM for pt. Porfirio Oar PharmD  October 22, 2009 9:02 AM  Called spoke with pt, advised to continue on same dosage 10mg  daily.  Recheck in 2 weeks.  Pt pending colonoscopy on 11/18/09, bridging with Arixtra, instructions given and rx has previously been sent to  pharmacy.   Current Anticoagulation Instructions: INR 1.5  Take 15mg  today and tomorrow. Recheck INR on Friday. Continue Arixtra

## 2010-05-10 NOTE — Medication Information (Signed)
Summary: Coumadin Clinic  Anticoagulant Therapy  Managed by: [name], RN, BSN Referring MD: [name] MD PCP: dr Bryn Gulling MD: Aundra Dubin MD, Kirk Ruths Indication 1: Mitral Valve Replacement (ICD-V43.3) Indication 2: TIA (ICD-435.9) Lab Used: Home Monitoring  [place] Site: [place] INR POC 2.6 INR RANGE 2.5 - 3.5    Bleeding/hemorrhagic complications: no     Any changes in medication regimen? no     Any missed doses?: no         Allergies: No Known Drug Allergies  Anticoagulation Management History:      His anticoagulation is being managed by telephone today.  Negative risk factors for bleeding include an age less than 86 years old.  The bleeding index is 'low risk'.  Positive CHADS2 values include History of CHF and History of HTN.  Negative CHADS2 values include Age > 64 years old.  The start date was 04/10/1997.  His last INR was 2.9 ratio.  Anticoagulation responsible provider: Aundra Dubin MD, Eberardo Demello.  INR POC: 2.6.  Exp: 05/2010.    Anticoagulation Management Assessment/Plan:      The patient's current anticoagulation dose is Coumadin 10 mg  tabs: use as directed by coumadin clinic.  The target INR is 2.5 - 3.5.  The next INR is due 06/16/2009.  Anticoagulation instructions were given to patient.  Results were reviewed/authorized by [name], RN, BSN.  He was notified by [name] RN.         Prior Anticoagulation Instructions: INR 3.5  Continue 10 mg daily.  Recheck in 1 week.   Current Anticoagulation Instructions: INR 2.6  Called spoke with pt.  Advised to continue on same dosage 10mg  daily.  Recheck in 1 week.  Pt is a self-tester.

## 2010-05-10 NOTE — Medication Information (Signed)
Summary: Coumadin Clinic  Anticoagulant Therapy  Managed by: [name], RN, BSN Referring MD: [name] MD PCP: dr Bryn Gulling MD: Johnsie Cancel MD, Collier Salina Indication 1: Mitral Valve Replacement (ICD-V43.3) Indication 2: TIA (ICD-435.9) Lab Used: Home Monitoring  [place] Site: [place] INR POC 2.8 INR RANGE 2.5 - 3.5    Bleeding/hemorrhagic complications: no    Recent/future hospitalizations: yes       Details: Pt r/s colonoscopy for 11/18/09, Pt has rx for Arixtra which was previously called in prior to scheduled colonoscopy which had to be cancelled and r/s.    Any changes in medication regimen? no     Any missed doses?: no       Is patient compliant with meds? yes      Comments: Instructed pt last dose of coumadin on 11/12/09, nothing on 11/13/09, start Arixtra 10mg  once daily on 11/14/09. Last dose of Arixtra on 11/17/09, colonoscopy on 11/18/09.  Allergies: No Known Drug Allergies  Anticoagulation Management History:      His anticoagulation is being managed by telephone today.  Negative risk factors for bleeding include an age less than 35 years old.  The bleeding index is 'low risk'.  Positive CHADS2 values include History of CHF and History of HTN.  Negative CHADS2 values include Age > 30 years old.  The start date was 04/10/1997.  His last INR was 2.3 ratio.  Anticoagulation responsible provider: Johnsie Cancel MD, Collier Salina.  INR POC: 2.8.  Exp: 11/2010.    Anticoagulation Management Assessment/Plan:      The patient's current anticoagulation dose is Coumadin 10 mg  tabs: use as directed by coumadin clinic.  The target INR is 2.5 - 3.5.  The next INR is due 11/05/2009.  Anticoagulation instructions were given to patient.  Results were reviewed/authorized by [name], RN, BSN.  He was notified by [name] RN.         Prior Anticoagulation Instructions: INR 2.6 Continue 10mg  daily. Recheck in one week. Self-tester.   Current Anticoagulation Instructions: INR  2.8  LMOM for pt. Porfirio Oar PharmD  October 22, 2009 9:02 AM  Called spoke with pt, advised to continue on same dosage 10mg  daily.  Recheck in 2 weeks.  Pt pending colonoscopy on 11/18/09, bridging with Arixtra, instructions given and rx has previously been sent to pharmacy.

## 2010-05-10 NOTE — Medication Information (Signed)
Summary: rov/eac  Anticoagulant Therapy  Managed by: [name], RN, BSN Referring MD: [name] MD PCP: dr Bryn Gulling MD: [name] MD, Quillian Quince Indication 1: Mitral Valve Replacement (ICD-V43.3) Indication 2: TIA (ICD-435.9) Lab Used: Home Monitoring  [place] Site: [place] PT 23.4 INR POC 2.2 INR RANGE 2.5 - 3.5  Dietary changes: no    Health status changes: no    Bleeding/hemorrhagic complications: no    Recent/future hospitalizations: no    Any changes in medication regimen? no    Recent/future dental: no  Any missed doses?: no       Is patient compliant with meds? yes      Comments: Self tester machine 2.5. Sent to lab for INR venipunture, Pending Colonoscopy on June 2nd. Has instructions. Arixtra has been ordered.   Allergies: No Known Drug Allergies  Anticoagulation Management History:      The patient is taking warfarin and comes in today for a routine follow up visit.  Negative risk factors for bleeding include an age less than 49 years old.  The bleeding index is 'low risk'.  Positive CHADS2 values include History of CHF and History of HTN.  Negative CHADS2 values include Age > 44 years old.  The start date was 04/10/1997.  His last INR was 2.9 ratio and today's INR is 2.3.  Prothrombin time is 23.4.  Anticoagulation responsible provider: Bensimhon MD, Quillian Quince.  INR POC: 2.2.  Cuvette Lot#: [id number].  Exp: 11/2010.    Anticoagulation Management Assessment/Plan:      The patient's current anticoagulation dose is Coumadin 10 mg  tabs: use as directed by coumadin clinic.  The target INR is 2.5 - 3.5.  The next INR is due 09/13/2009.  Anticoagulation instructions were given to patient.  Results were reviewed/authorized by [name], RN, BSN.  He was notified by [name], RN, BSN.         Prior Anticoagulation Instructions: LMOM for pt. Porfirio Oar PharmD  Aug 11, 2009 2:28 PM   Spoke with pt.  Eat a large dose of green vegetables and continue  same dose of 1 tablet daily.  Will call pt once information available from [place]. Porfirio Oar PharmD  Aug 12, 2009 2:35 PM   Current Anticoagulation Instructions: INR 2.2 Pt sent to lab for venipuncture... 2.3 INR from lab. Pt. to call [facility] and inform them of lab results for machine accuracy.   15mg  today then resume 10mg s daily. Last dose 09/03/09 for procedure on 09/09/09. Has Arixtra instructions.  Check INR on 09/13/09 post procedure for coumadin and Arixtra instructions.

## 2010-05-10 NOTE — Progress Notes (Signed)
  Phone Note Outgoing Call   Call placed by: Barnett Abu, RN, BSN,  May 06, 2009 5:20 PM Call placed to: Patient Summary of Call: NO answer To notify of lab results. Barnett Abu, RN, BSN  May 06, 2009 5:21 PM   Follow-up for Phone Call        Pt aware Follow-up by: Barnett Abu, RN, BSN,  May 11, 2009 10:49 AM

## 2010-05-10 NOTE — Cardiovascular Report (Signed)
Summary: Office Visit   Office Visit   Imported By: Sallee Provencal 04/22/2009 10:42:35  _____________________________________________________________________  External Attachment:    Type:   Image     Comment:   External Document

## 2010-05-10 NOTE — Progress Notes (Signed)
Summary:  calling to f/u  Phone Note Call from Patient Call back at Home Phone [phone]   Caller: Patient Reason for Call: Talk to Nurse, Talk to Doctor Summary of Call: pt wants to f/u on e-mail he sent yesterday and make sure you got it and it was clear on what he needed and you didn't have any quetions Initial call taken by: Shelda Pal,  May 28, 2009 11:34 AM  Follow-up for Phone Call        Communicated with patient.  He is requesting a letter from Dr [name] stating that he needs a large amount of diuretics and a restricted sodium diet so that he can reduce the amount of travel he needs to do with work.  I advised pt to call first thing Monday morning since Dr [name] will be in the office Monday afternoon to address. Chanetta Marshall RN BSN  May 28, 2009 12:56 PM   Additional Follow-up for Phone Call Additional follow up Details #1::        S/W pt and he is having a meeting with him management on Friday and would need the letter no later than Thurs. I told him that Dr. [name] is only in the office a half day today and half day tomorrow and I would certainly let him know today.  Barnett Abu, RN, BSN  May 31, 2009 9:57 AM      Additional Follow-up for Phone Call Additional follow up Details #2::    Letter was dictated by Dr. [name] on 05/31/09. Called and left pt a message that the letter was done and that I am just waiting for it to get to me from dictation. Barnett Abu, RN, BSN  June 01, 2009 2:44 PM   Additional Follow-up for Phone Call Additional follow up Details #3:: Details for Additional Follow-up Action Taken: Pt aware that letter is ready for p/u. Placed in an envelope at front desk. Additional Follow-up by: Barnett Abu, RN, BSN,  June 03, 2009 3:30 PM  `

## 2010-05-10 NOTE — Progress Notes (Signed)
Summary: stop coumadin  Phone Note From Other Clinic   Caller: Nurse [place] Summary of Call: Per Melissa Dr Joette Catching ofc pt having a colonoscopy in June. He needs to stop Coumdain prior to appt. Ofc [id number]  Initial call taken by: Lorraine Lax,  July 28, 2009 2:47 PM  Follow-up for Phone Call        forwarded to Dr Otelia Sergeant, RN  July 29, 2009 10:34 AM  Per Dr [name], pt would need coverage with Lovenox because of MVR and prior TIA, if just regular screening colonoscopy, should be done on Coumadin and then we could bridge if needed for additional procedures. Chanetta Marshall RN BSN  July 29, 2009 2:22 PM   Additional Follow-up for Phone Call Additional follow up Details #1::        Spoke with Melissa, patient has had polyps in the past, Dr Michail Sermon wants procedure to be done off anit-coagulation.  Pt took Arixtra to bridge for cath.  Pt has appt with Dr [name] May 3rd, will plan Arixtra coverage for colonoscopy on June 2nd at that visit. Chanetta Marshall RN BSN  July 29, 2009 2:48 PM

## 2010-05-10 NOTE — Letter (Signed)
Summary: D/C O2  D/C O2   Imported By: Jamelle Haring 08/23/2009 08:38:06  _____________________________________________________________________  External Attachment:    Type:   Image     Comment:   External Document

## 2010-05-10 NOTE — Medication Information (Signed)
Summary: rov/ewj  Anticoagulant Therapy  Managed by: Alinda Deem, PharmD, BCPS, CPP Referring MD: [name] MD PCP: dr Bryn Gulling MD: Olevia Perches MD, Karema Tocci Indication 1: Mitral Valve Replacement (ICD-V43.3) Indication 2: TIA (ICD-435.9) Lab Used: Home Monitoring  [facility] Site: [facility] INR POC 3.1 INR RANGE 2.5 - 3.5  Dietary changes: no    Health status changes: no    Bleeding/hemorrhagic complications: no    Recent/future hospitalizations: yes       Details: R/L heart cath on 1/31...  Any changes in medication regimen? no    Recent/future dental: no  Any missed doses?: no       Is patient compliant with meds? yes       Current Medications (verified): 1)  Klor Con 17meq .... Take 2  Tabs  By Mouth Each Morning 3 Tabs  in The Noon and 2 Tabs in The Evening or As Directed 2)  Torsemide 20 Mg  Tabs (Torsemide) .... Take 2 Tabs By Mouth Once Daily or As Directed 3)  Furosemide 40 Mg  Tabs (Furosemide) .... As Directed/ 5 A Day Currently 4)  Diovan Hct 80-12.5 Mg  Tabs (Valsartan-Hydrochlorothiazide) .... Take 1 Tablet By Mouth Once A Day 5)  Coumadin 10 Mg  Tabs (Warfarin Sodium) .... Use As Directed By Coumadin Clinic 6)  Fish Oil 1000 Mg  Caps (Omega-3 Fatty Acids) .... Take 4 Tabs By Mouth Once Daily 7)  Cvs Vitamin C 1000 Mg  Tabs (Ascorbic Acid) .... Take 2 Tabs By Mouth Once Daily 8)  Multivitamins   Tabs (Multiple Vitamin) .... Take 1 Tablet By Mouth Once A Day 9)  Low-Dose Aspirin 81 Mg  Tabs (Aspirin) .... Take 1 Tablet By Mouth Once A Day 10)  Magnesium Oxide 400 Mg  Caps (Magnesium Oxide) .... Take 1 Tablet By Mouth Once A Dayor As Directed 11)  B 12 1,000mg  .... Daily 12)  Arixtra 10 Mg/0.75ml Soln (Fondaparinux Sodium) .... Inject 1 Syringe Subcutaneously Daily At 0700 Am Daily On 1/27 - 1/30.  Restart Injections Daily On 2/1 As Directed.  Allergies (verified): No Known Drug Allergies  Anticoagulation Management History:      Negative risk factors  for bleeding include an age less than 72 years old.  The bleeding index is 'low risk'.  Positive CHADS2 values include History of CHF and History of HTN.  Negative CHADS2 values include Age > 67 years old.  The start date was 04/10/1997.  His last INR was 3.3 ratio.  Anticoagulation responsible provider: Olevia Perches MD, Darnell Level.  INR POC: 3.1.  Exp: 02/2010.    Anticoagulation Management Assessment/Plan:      The patient's current anticoagulation dose is Coumadin 10 mg  tabs: use as directed by coumadin clinic.  The target INR is 2.5 - 3.5.  The next INR is due 05/13/2009.  Anticoagulation instructions were given to patient.  Results were reviewed/authorized by Alinda Deem, PharmD, BCPS, CPP.  He was notified by Alinda Deem PharmD, BCPS, CPP.         Prior Anticoagulation Instructions: INR 2.7  Pt is a self-tester, LMOM to continue on same dosage 10mg  daily, call back with any bleeding problems, new medications, or upcoming procedures.  Recheck PT/INR in 1 week.   Current Anticoagulation Instructions: Hold warfarin starting today.  On Sunday, 1/30, restart warfarin at 15 mg = 1.5 tabs daily.    Start arixtra 10 mg daily on 05/06/2009.  Give 1 dose daily at 0700.  Take dose on 1/30 no  later than 0730 am.    Call with problems or issues.   Prescriptions: ARIXTRA 10 MG/0.8ML SOLN (FONDAPARINUX SODIUM) Inject 1 syringe subcutaneously daily at 0700 am daily on 1/27 - 1/30.  Restart injections daily on 2/1 as directed.  #10 x 1   Entered by:   Alinda Deem PharmD, BCPS, CPP   Authorized by:   Nikki Dom, MD, [facility]   Signed by:   Alinda Deem PharmD, BCPS, CPP on 05/05/2009   Method used:   Electronically to        [facility] # [phone]* (retail)       [address]       [place], [place]  [address]       Ph: [phone]       Fax: [phone]   RxID:   [id number]   Appended Document: rov/ewj    Prescriptions: ARIXTRA 10 MG/0.8ML SOLN (FONDAPARINUX SODIUM) Inject 1 syringe  subcutaneously daily at 0700 am daily on 1/27 - 1/30.  Restart injections daily on 2/1 as directed.  #10 x 1   Entered by:   Alinda Deem PharmD, BCPS, CPP   Authorized by:   Nikki Dom, MD, [facility]   Signed by:   Alinda Deem PharmD, BCPS, CPP on 05/05/2009   Method used:   Electronically to        [facility] [address] [phone]* (retail)       [address]       [place], [place]  [address]       Ph: [phone] or [phone]       Fax: [phone]   RxID:   [id number]

## 2010-05-10 NOTE — Assessment & Plan Note (Signed)
Summary: 12:30  ec6   Primary Provider:  dr Jimmy Footman  CC:  SOB.  History of Present Illness: 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 He is also s/p PPM for severe 1 AVB. He is also s/p previous lef-sided ablations for atrial dysrhythmias c/b severe MR which led to his cardiac surgery.   He saw Dr. [name] 2 weeks ago c/o progressive dyspnea and edema. After we discussed we decided to proceed with R and L cath to evalaute filling pressures and r/o constriction.  He presented in cath lab on Monday for cath but wanted to reschedule so he could come and discuss procedure with me first.   Was doing well at the early part of last year after his pacer. However over past few months he has had a significant worsening in his dyspnea. Feels very weak. Felt his HR was not responding to exercise well with HRs staying in 60s and pacer was adjusted without benefit. Also has had worsening LE edema. No CP. Has not gained weight. Has never been  tested for OSA. Wife denies snoring or witnessed apnea. + increased fatigue. falls alseep easily while watching TV. Has h/o tobacco but quit 20+ years ago.  In looking back over records he had a recent BNP that was 52 and a chest x-ray showed vascular congestion.  Echo last month showed EF 55% with well functioning MVR. Estimated RVSP was 51.   CPX test done about a year ago demonstrated a VO2 max of 12.2 representing 66.4% predicted. his VO2 adjusted for body weight was 25.9 His slope was 34 and his O2 pulse was 17, 88% of predicted.  It was felt that his primary limitations were obesity and ventilatory    Current Medications (verified): 1)  Potassium Chloride Crys Cr 20 Meq Cr-Tabs (Potassium Chloride Crys Cr) .... 4 Tabs Four Times A Day or As Needed 2)  Torsemide 20 Mg  Tabs (Torsemide) .... Take 2 Tabs By Mouth Once Daily or As Directed 3)   Furosemide 40 Mg  Tabs (Furosemide) .... As Directed 4)  Diovan Hct 80-12.5 Mg  Tabs (Valsartan-Hydrochlorothiazide) .... Take 1 Tablet By Mouth Once A Day 5)  Coumadin 10 Mg  Tabs (Warfarin Sodium) .... Use As Directed By Coumadin Clinic 6)  Fish Oil 1000 Mg  Caps (Omega-3 Fatty Acids) .... Take 4 Tabs By Mouth Once Daily 7)  Cvs Vitamin C 1000 Mg  Tabs (Ascorbic Acid) .... Take 2 Tabs By Mouth Once Daily 8)  Multivitamins   Tabs (Multiple Vitamin) .... Take 1 Tablet By Mouth Once A Day 9)  Low-Dose Aspirin 81 Mg  Tabs (Aspirin) .... Take 1 Tablet By Mouth Once A Day 10)  Magnesium Oxide 400 Mg  Caps (Magnesium Oxide) .... Take 6 Tablet By Mouth Once A Dayor As Directed 11)  B 12 1,000mg  .... Daily 12)  Arixtra 10 Mg/0.103ml Soln (Fondaparinux Sodium) .... Inject 1 Syringe Subcutaneously Daily At 0700 Am Daily On 1/27 - 1/30.  Restart Injections Daily On 2/1 As Directed.  Allergies (verified): No Known Drug Allergies  Past History:  Past Medical History: Current Problems:  S/P Mitral valve replacement S/P Aortic valve repair Hypertrophic Cardiomyopathy  with prior myectomy 1999 in [place], [place]    --cardiac cath in 1999 (pre-op): no CAD CHF -Diasystolic ATRIAL ARRHYTHMIAS -multiple noted at [facility] at [place], [place] (  ICD-426.11) MORBID OBESITY (ICD-278.01) MELANOMA, HX OF (ICD-V10.82) HYPERTENSION (ICD-401.9)     Family History: Reviewed history from 07/17/2008 and no changes required. Father: Deceased-Age 59 colon cancer Mother: living, reportedly healthy Siblings: Sister living reportedly healthy, one sister with severe obesity and heart problems.  One brother good health with history of ablation.  Social History: Reviewed history from 07/17/2008 and no changes required. Full Time-[employer]  Tobacco Use - Former. Quit 22+ yrs Alcohol Use - yes-occasionally  Review of Systems       As per HPI and past medical history; otherwise all systems  negative.   Vital Signs:  Patient profile:   65 year old male Height:      72 inches Weight:      369 pounds BMI:     50.23 Pulse rate:   55 / minute BP sitting:   124 / 78  (right arm) Cuff size:   large  Vitals Entered By: Mignon Pine, RMA (May 13, 2009 12:43 PM)  Physical Exam  General:  The patient was alert and oriented no respiratory distress; he is morbidly obese O2 sat at rest 90-91% with walking down to 81% worse when talking  HR 80 at rest up to 100 with hall walk HEENT Normal.   Neck thick. JVP appears mildly elevated in 9-10 range but hard to evaluate. carotids were brisk. no bruits Lungs were clear.  Heart sounds were regular with mechanical S. soft SEM at RSB decreased with valsalva Abdomen was soft with active bowel sounds. There is no clubbing cyanosis.: 2+ edema R 1+ left Skin Warm and dry    PPM Specifications Following MD:  [name], MD     PPM Vendor:  St Jude     PPM Model Number:  [id number]     PPM Serial Number:  [id number] DOI:  07/22/2008     PPM Implanting MD:  [name], MD  Lead 1    Location: RA     DOI: 07/22/2008     Model #: [id number]     Serial #: [id number]     Status: active Lead 2    Location: RV     DOI: 07/22/2008     Model #: [id number]     Serial #: [id number]     Status: active  Magnet Response Rate:  BOL 100 ERI 85  Indications:  CHF; HOCM   PPM Follow Up Pacer Dependent:  No      Episodes Coumadin:  Yes  Parameters Mode:  DDD     Lower Rate Limit:  60     Upper Rate Limit:  125 Paced AV Delay:  200     Sensed AV Delay:  200  Impression & Recommendations:  Problem # 1:  DYSPNEA (ICD-786.05) I think the major problem here is 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 We discussed the mechanism of this and I have suggested starting home O2 and sleep study (wants to do home kit) as well as consideration of a physician-supervised rapid weight loss  program to help him get started with a 30-40 pound weight loss to get him back from over the edge. We also discussed the role of heart catheterization to clearly assess pulmonary pressures, LVEDP and exclude pericaridal constriction and development of CAD (the latter of the two I feel are unlikely). I explained to him that I thought we could defer these if he preferred while we worked  on his respiratory issues but he wanted to proceed to clerly define his heart situation. I also discussed the issue of appropriate rate response pacer programming with Dr. [name] and it seems as if his HR response is appropriate at this time. We will see him back as needed. Total time spent 65 minutes.  Other Orders: Cardiac Catheterization (Cardiac Cath)  Patient Instructions: 1)  Your physician recommends that you return for lab work (bmet, cbc, pt--786.05, v72.81) on Febuary 17 2)  Your physician has requested that you have a cardiac catheterization.  Cardiac catheterization is used to diagnose and/or treat various heart conditions. Doctors may recommend this procedure for a number of different reasons. The most common reason is to evaluate chest pain. Chest pain can be a symptom of coronary artery disease (CAD), and cardiac catheterization can show whether plaque is narrowing or blocking your heart's arteries. This procedure is also used to evaluate the valves, as well as measure the blood flow and oxygen levels in different parts of your heart.  For further information please visit [email or web address].  Please follow instruction sheet, as given. 3)  We will arrange Home health for oxygen and overnight sleep test.

## 2010-05-10 NOTE — Letter (Signed)
Summary: [employer]  [employer]   Imported By: Marilynne Drivers 01/17/2010 13:52:17  _____________________________________________________________________  External Attachment:    Type:   Image     Comment:   External Document

## 2010-05-10 NOTE — Assessment & Plan Note (Signed)
Summary: PC2   Visit Type:  Pacemaker check Primary Provider:  dr Jimmy Footman  CC:  NO COMPLAINTS.  History of Present Illness: [name] skin followup for hypertrophic cardiomyopathy status post myectomy mitral valve and aortic valve replacements. 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 He saw Dr. Reine Just recommended nocturnal oxygen and a right left heart catheter. The patient deferred on both as he had found that the interval dietary changes from my last visit to do aggressive low-salt diet and a higher diuretic regime had resulted in significant improvement.  He is now lifting 50 pound bags. He is working in the yard. He is able to ambulate without significant shortness of breath.   Echo last month showed EF 55% with well functioning MVR. Estimated RVSP was 51.   CPX test done about a year ago demonstrated a VO2 max of 12.2 representing 66.4% predicted. his VO2 adjusted for body weight was 25.9 His slope was 34 and his O2 pulse was 17, 88% of predicted.  It was felt that his primary limitations were obesity and ventilatory    Current Medications (verified): 1)  Potassium Chloride Crys Cr 20 Meq Cr-Tabs (Potassium Chloride Crys Cr) .... 4 Tabs Four Times A Day or As Needed 2)  Torsemide 20 Mg  Tabs (Torsemide) .... Take 2 Tabs By Mouth Once Daily or As Directed 3)  Furosemide 40 Mg  Tabs (Furosemide) .... As Directed 4)  Diovan Hct 80-12.5 Mg  Tabs (Valsartan-Hydrochlorothiazide) .... Take 1 Tablet By Mouth Once A Day 5)  Coumadin 10 Mg  Tabs (Warfarin Sodium) .... Use As Directed By Coumadin Clinic 6)  Fish Oil 1000 Mg  Caps (Omega-3 Fatty Acids) .... Take 4 Tabs By Mouth Once Daily 7)  Cvs Vitamin C 1000 Mg  Tabs (Ascorbic Acid) .... Take 2 Tabs By Mouth Once Daily 8)  Multivitamins   Tabs (Multiple Vitamin) .... Take 1 Tablet By Mouth Once A Day 9)  Low-Dose Aspirin 81 Mg  Tabs (Aspirin) .... Take 1  Tablet By Mouth Once A Day 10)  Magnesium Oxide 400 Mg  Caps (Magnesium Oxide) .... Take 6 Tablet By Mouth Once A Dayor As Directed 11)  B 12 1,000mg  .... Daily 12)  Arixtra 10 Mg/0.83ml Soln (Fondaparinux Sodium) .... Inject 1 Syringe Subcutaneously Daily At 0700 Am Daily On 1/27 - 1/30.  Restart Injections Daily On 2/1 As Directed.  Allergies (verified): No Known Drug Allergies  Past History:  Past Medical History: Last updated: 05/13/2009 Current Problems:  S/P Mitral valve replacement S/P Aortic valve repair Hypertrophic Cardiomyopathy  with prior myectomy 1999 in [place], [place]    --cardiac cath in 1999 (pre-op): no CAD CHF -Diasystolic ATRIAL ARRHYTHMIAS -multiple noted at [facility] at [place], [place] (ICD-426.11) MORBID OBESITY (ICD-278.01) MELANOMA, HX OF (ICD-V10.82) HYPERTENSION (ICD-401.9)     Vital Signs:  Patient profile:   65 year old male Height:      72 inches Weight:      350 pounds BMI:     47.64 Pulse rate:   80 / minute BP sitting:   111 / 63  (left arm) Cuff size:   large  Vitals Entered By: Lubertha Basque, CNA (Aug 10, 2009 3:58 PM)  Physical Exam  General:  The patient was alert and oriented in no acute distress; he is morbidly obese HEENT Normal.  Neck veins were flat, carotids were brisk.  Lungs  were clear.  Heart sounds were regular without murmurs or gallops.  Abdomen was soft with active bowel sounds. There is no clubbing cyanosis or edema. Skin Warm and dry    PPM Specifications Following MD:  [name], MD     PPM Vendor:  St Jude     PPM Model Number:  [id number]     PPM Serial Number:  [id number] PPM DOI:  07/22/2008     PPM Implanting MD:  [name], MD  Lead 1    Location: RA     DOI: 07/22/2008     Model #: [id number]     Serial #: [id number]     Status: active Lead 2    Location: RV     DOI: 07/22/2008     Model #: [id number]     Serial #: [id number]     Status: active  Magnet Response Rate:  BOL 100 ERI 85  Indications:  CHF;  HOCM   PPM Follow Up Remote Check?  No Battery Voltage:  2.98 V     Battery Est. Longevity:  9.9 years     Pacer Dependent:  No       PPM Device Measurements Atrium  Amplitude: 3.7 mV, Impedance: 450 ohms, Threshold: 0.75 V at 0.4 msec Right Ventricle  Amplitude: 9.4 mV, Impedance: 640 ohms, Threshold: 0.75 V at 0.4 msec  Episodes MS Episodes:  492     Percent Mode Switch:  16%     Coumadin:  Yes Ventricular High Rate:  4     Atrial Pacing:  18%     Ventricular Pacing:  88%  Parameters Mode:  DDD     Lower Rate Limit:  60     Upper Rate Limit:  125 Paced AV Delay:  200     Sensed AV Delay:  200 Next Cardiology Appt Due:  02/08/2010 Tech Comments:  No parameter changes.  492 mode switch episodes the longest 2:34 hours with ventricular rates controlled.  There were 4 VHR episodes noted the longest 6 seconds that appeared to be VT.  No Merlin @ this time   ROV 6 months. Alma Friendly, LPN  May  3, 624THL [id number] PM   Impression & Recommendations:  Problem # 1:  VENTRICULAR TACHYCARDIA (ICD-427.1) nonsustained ventricular tachycardia were identified on his pacemaker His updated medication list for this problem includes:    Coumadin 10 Mg Tabs (Warfarin sodium) ..... Use as directed by coumadin clinic    Low-dose Aspirin 81 Mg Tabs (Aspirin) .Marland Kitchen... Take 1 tablet by mouth once a day    Arixtra 10 Mg/0.33ml Soln (Fondaparinux sodium) ..... Inject 1 syringe subcutaneously daily at 0700 am daily on 1/27 - 1/30.  restart injections daily on 2/1 as directed.  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 He is actually doing terrific. We will keep him on his current medical regime. Coumadin followup metabolic profile based upon his diuretic regime changed  Problem # 4:  MORBID  OBESITY (ICD-278.01) he has lost 35 lbs !!!!!!!

## 2010-05-12 ENCOUNTER — Encounter: Payer: Self-pay | Admitting: Internal Medicine

## 2010-05-12 NOTE — Medication Information (Signed)
Summary: Coumadin Clinic  Anticoagulant Therapy  Managed by: Porfirio Oar, PharmD Referring MD: [name] MD PCP: dr Bryn Gulling MD: Verl Blalock MD, Marcello Moores Indication 1: Mitral Valve Replacement (ICD-V43.3) Indication 2: TIA (ICD-435.9) Lab Used: Home Monitoring  [place] Site: [place] INR POC 3.5 INR RANGE 2.5 - 3.5  Dietary changes: no    Health status changes: yes       Details: increase in fluid over the last week.   Bleeding/hemorrhagic complications: no    Recent/future hospitalizations: no    Any changes in medication regimen? no    Recent/future dental: no  Any missed doses?: no       Is patient compliant with meds? yes       Allergies: No Known Drug Allergies  Anticoagulation Management History:      His anticoagulation is being managed by telephone today.  Negative risk factors for bleeding include an age less than 54 years old.  The bleeding index is 'low risk'.  Positive CHADS2 values include History of CHF and History of HTN.  Negative CHADS2 values include Age > 39 years old.  The start date was 04/10/1997.  His last INR was 2.3 ratio.  Anticoagulation responsible provider: Verl Blalock MD, Marcello Moores.  INR POC: 3.5.  Exp: 11/2010.    Anticoagulation Management Assessment/Plan:      The patient's current anticoagulation dose is Coumadin 10 mg  tabs: use as directed by coumadin clinic.  The target INR is 2.5 - 3.5.  The next INR is due 05/09/2010.  Anticoagulation instructions were given to patient.  Results were reviewed/authorized by Porfirio Oar, PharmD.  He was notified by Porfirio Oar PharmD.         Prior Anticoagulation Instructions: INR 4.1  Attempted to call pt with results.  LMOM TCB for results.  [name] RN  April 25, 2010 3:32 PM  Pt called back [facility] he skipped Saturdays dosage of Coumadin and then resumed same dosage 10mg  daily.  Pt is going to recheck this week. [name] RN  April 25, 2010 4:59 PM   Current Anticoagulation Instructions: INR  3.5  Continue same dose of 1 tablet every day.  Recheck INR in 1 week.

## 2010-05-12 NOTE — Assessment & Plan Note (Signed)
Summary: [facility]   Visit Type:  rov Primary Provider:  dr Jimmy Footman  CC:  pt worried he may have had a stroke, dizziness, sob, and swelling.  History of Present Illness: mr Evitt is seen in followup for congestive heart through the setting of hypertrophic adenopathy diastolic failure and morbid obesity. He has struggled with fluid retention. We gotten down to about 350 last year and he did really well for a while. He has reaccumulated edema and did very difficult to manage as he travels. 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  He also tells me today that he has had episodes over the years for his transient vertical diplopia. In 10 days ago he was driving home and had diplopia lasting seconds to minutes and is left with residual visual loss in his left eye. He is also noticing ptosis in the left eye.  He's had a couple of days which is felt very weak. 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 Taking 14 Tablets Daily 2)  Torsemide 20 Mg  Tabs (Torsemide) .... Take 3 Tabs By Mouth Once Daily or As Directed 3)  Furosemide 40 Mg  Tabs (Furosemide) .... 3 Daily Or, As Directed 4)  Diovan Hct 80-12.5 Mg  Tabs (Valsartan-Hydrochlorothiazide)  .... Take 1 Tablet By Mouth Once A Day 5)  Coumadin 10 Mg  Tabs (Warfarin Sodium) .... Use As Directed By Coumadin Clinic 6)  Fish Oil 1000 Mg  Caps (Omega-3 Fatty Acids) .... Take 4 Tabs By Mouth Once Daily 7)  Cvs Vitamin C 1000 Mg  Tabs (Ascorbic Acid) .... Take 2 Tabs By Mouth Once Daily 8)  Multivitamins   Tabs (Multiple Vitamin) .... Take 1 Tablet By Mouth Once A Day 9)  Low-Dose Aspirin 81 Mg  Tabs (Aspirin) .... Take 1 Tablet By Mouth Once A Day 10)  Magnesium Oxide 400 Mg  Caps (Magnesium Oxide) .... Take 6 Tablet By Mouth Once A Dayor As Directed 11)  B 12 1,000mg  .... Daily 12)  Zyloprim 300 Mg Tabs (Allopurinol) .... Take 1 Tablet By Mouth Daily 13)  Colcrys 0.6 Mg Tabs (Colchicine) .... Take 1 Tablet By Mouth Daily  Allergies (verified): No Known Drug Allergies  Past History:  Past Medical History: Last updated: 02/16/2010 Current Problems:  S/P Mitral valve replacement S/P Aortic valve repair Hypertrophic Cardiomyopathy  with prior myectomy 1999 in [place], [place]    --cardiac cath in 1999 (pre-op): no CAD CHF -Diasystolic ATRIAL ARRHYTHMIAS -multiple noted at [facility] at [place], [place] (ICD-426.11) MORBID OBESITY (ICD-278.01) MELANOMA, HX OF (ICD-V10.82) HYPERTENSION (ICD-401.9) status post implant St. Jude ACCENT DR RF     Past Surgical History: Last updated: 02/16/2010 Mitral Valve replacement/Aortic Valve repair in April 1999 Surgical Myectomy for  hypertrophic cardiomyopathy Colonoscopy  Family History: Last updated: 08/11/08 Father: Deceased-Age 5 colon cancer Mother: living, reportedly healthy Siblings: Sister living reportedly healthy, one sister with severe obesity and heart problems.  One brother good health with history of ablation.  Social History: Last updated: Aug 11, 2008 Full Time-[employer]  Tobacco Use - Former. Quit 22+ yrs Alcohol Use - yes-occasionally  Risk Factors: Smoking Status: quit (08/11/08)  Vital  Signs:  Patient profile:   65 year old male Height:      72 inches Weight:      365.50 pounds BMI:     49.75 Pulse rate:   66 / minute BP sitting:   138 / 82  (left arm) Cuff size:   large  Vitals Entered By: Hansel Feinstein CMA (May 03, 2010 4:08 PM)  Physical Exam  General:  The patient was alert and oriented in no acute distress clinical he is morbidly obese HEENT Normal.  Neck veins were flat, carotids were brisk.  Lungs were clear.  Heart sounds were regular without murmurs or gallops.  Abdomen was soft with active bowel sounds. There is no clubbing cyanosis ; right  greater than left edema Skin Warm and dry    PPM Specifications Following MD:  [name], MD     PPM Vendor:  St Jude     PPM Model Number:  [id number]     PPM Serial Number:  [id number] PPM DOI:  07/22/2008     PPM Implanting MD:  [name], MD  Lead 1    Location: RA     DOI: 07/22/2008     Model #: [id number]     Serial #: [id number]     Status: active Lead 2    Location: RV     DOI: 07/22/2008     Model #: [id number]     Serial #: [id number]     Status: active  Magnet Response Rate:  BOL 100 ERI 85  Indications:  CHF; HOCM   PPM Follow Up Battery Voltage:  2.95 V     Battery Est. Longevity:  7.5-8.1 yrs     Pacer Dependent:  No       PPM Device Measurements Atrium  Amplitude: 4.5 mV, Impedance: 460 ohms, Threshold: 0.75 V at 0.4 msec Right Ventricle  Amplitude: 9.0 mV, Impedance: 640 ohms, Threshold: 0.5 V at 0.4 msec  Episodes MS Episodes:  12411     Percent Mode Switch:  13%     Coumadin:  Yes Ventricular High Rate:  3     Atrial Pacing:  52%     Ventricular Pacing:  83%  Parameters Mode:  DDDR     Lower Rate Limit:  60     Upper Rate Limit:  125 Paced AV Delay:  200     Sensed AV Delay:  200 Next Remote Date:  08/04/2010     Tech Comments:  [id number] AMS EPISODES--13% OF TIME. + COUMADIN.  NORMAL DEVICE FUNCTION.  TURNED RATE RESPONSE DUE TO FLAT HISTOGRAM AND PT'S SYMPTOMS.  WALKED PT AND PER PT LESS SOB.   [facility] 08-04-10 AND ROV IN 12 MTHS W/SK. Shelly Bombard  Impression & Recommendations:  Problem # 1:  VENTRICULAR TACHYCARDIA (ICD-427.1) nonsustained ventricular tachycardia was identified His updated medication list for this problem includes:    Coumadin 10 Mg Tabs (Warfarin sodium) ..... Use as directed by coumadin clinic    Low-dose Aspirin 81 Mg Tabs (Aspirin) .Marland Kitchen... Take 1 tablet by mouth once a day  Problem #  2:  DIASTOLIC HEART FAILURE, CHRONIC (ICD-428.32) Will plan to add metolazone 3 times a week for 3 weeks to see if we can get his weight down about 10 pounds. We'll need to anticipate assessing his metabolic profile he'll continue his magnesium repletion His updated medication list for this problem includes:    Torsemide 20 Mg Tabs (Torsemide) .Marland Kitchen... Take 3 tabs by mouth once daily or as directed    Furosemide 40 Mg Tabs (Furosemide) .Marland KitchenMarland KitchenMarland KitchenMarland Kitchen 3 daily or, as directed    Diovan Hct 80-12.5 Mg Tabs (Valsartan-hydrochlorothiazide) .Marland Kitchen... Take 1 tablet by mouth once a day    Coumadin 10 Mg Tabs (Warfarin sodium) ..... Use as directed by coumadin clinic    Low-dose Aspirin 81 Mg Tabs (Aspirin) .Marland Kitchen... Take 1 tablet by mouth once a day    Zaroxolyn 2.5 Mg Tabs (Metolazone) .Marland Kitchen... Three times a week  Problem # 3:  PACEMAKER, STJ DDD (ICD-V45.01) Device parameters and data were reviewed and no changes were made  Problem # 4:  UNSPECIFIED VISUAL LOSS (ICD-369.9) patient has visual loss related temporally to an event last week. We will continue him on his Coumadin. There is no indication for aspirin. Will discontinue this. I will plan obtain consultation with ophthalmology to assess what happened to his eye. He may need to get a CT scan; MRI is precluded by his pacemaker.

## 2010-05-12 NOTE — Medication Information (Signed)
Summary: [facility]  Anticoagulant Therapy  Managed by: Porfirio Oar, PharmD Referring MD: [name] MD PCP: dr Bryn Gulling MD: Harrington Challenger MD, Nevin Bloodgood Indication 1: Mitral Valve Replacement (ICD-V43.3) Indication 2: TIA (ICD-435.9) Lab Used: Home Monitoring  [place] Site: [place] INR POC 3.5 INR RANGE 2.5 - 3.5  Dietary changes: no    Health status changes: no    Bleeding/hemorrhagic complications: no    Recent/future hospitalizations: no    Any changes in medication regimen? yes       Details: added allopurinol and colchicine  Recent/future dental: no  Any missed doses?: no       Is patient compliant with meds? yes       Current Medications (verified): 1)  Klor-Con 10 10 Meq Cr-Tabs (Potassium Chloride) .... Taking 14 Tablets Daily 2)  Torsemide 20 Mg  Tabs (Torsemide) .... Take 3 Tabs By Mouth Once Daily or As Directed 3)  Furosemide 40 Mg  Tabs (Furosemide) .... 3 Daily Or, As Directed 4)  Diovan Hct 80-12.5 Mg  Tabs (Valsartan-Hydrochlorothiazide) .... Take 1 Tablet By Mouth Once A Day 5)  Coumadin 10 Mg  Tabs (Warfarin Sodium) .... Use As Directed By Coumadin Clinic 6)  Fish Oil 1000 Mg  Caps (Omega-3 Fatty Acids) .... Take 4 Tabs By Mouth Once Daily 7)  Cvs Vitamin C 1000 Mg  Tabs (Ascorbic Acid) .... Take 2 Tabs By Mouth Once Daily 8)  Multivitamins   Tabs (Multiple Vitamin) .... Take 1 Tablet By Mouth Once A Day 9)  Low-Dose Aspirin 81 Mg  Tabs (Aspirin) .... Take 1 Tablet By Mouth Once A Day 10)  Magnesium Oxide 400 Mg  Caps (Magnesium Oxide) .... Take 6 Tablet By Mouth Once A Dayor As Directed 11)  B 12 1,000mg  .... Daily 12)  Zyloprim 300 Mg Tabs (Allopurinol) .... Take 1 Tablet By Mouth Daily 13)  Colcrys 0.6 Mg Tabs (Colchicine) .... Take 1 Tablet By Mouth Daily  Allergies: No Known Drug Allergies  Anticoagulation Management History:      The patient is taking warfarin and comes in today for a routine follow up visit.  Negative risk factors  for bleeding include an age less than 58 years old.  The bleeding index is 'low risk'.  Positive CHADS2 values include History of CHF and History of HTN.  Negative CHADS2 values include Age > 82 years old.  The start date was 04/10/1997.  His last INR was 2.3 ratio.  Anticoagulation responsible provider: Harrington Challenger MD, Nevin Bloodgood.  INR POC: 3.5.  Exp: 11/2010.    Anticoagulation Management Assessment/Plan:      The patient's current anticoagulation dose is Coumadin 10 mg  tabs: use as directed by coumadin clinic.  The target INR is 2.5 - 3.5.  The next INR is due 04/15/2010.  Anticoagulation instructions were given to patient.  Results were reviewed/authorized by Porfirio Oar, PharmD.  He was notified by Porfirio Oar PharmD.         Prior Anticoagulation Instructions: INR 3.3  LMOM for pt. Porfirio Oar PharmD  January 20, 2010 9:08 AM   Spoke with pt advised to continue taking 10mg  daily.  Recheck in 1 week. [name] RN  January 20, 2010 11:24 AM   Current Anticoagulation Instructions: INR 3.5  Spoke with pt.  Continue same dose of 1 tablet every day.  Recheck INR in 2 weeks.

## 2010-05-12 NOTE — Medication Information (Signed)
Summary: [facility] Clinic  Anticoagulant Therapy  Managed by: [name], RN, BSN Referring MD: [name] MD PCP: dr Bryn Gulling MD: Verl Blalock MD, Marcello Moores Indication 1: Mitral Valve Replacement (ICD-V43.3) Indication 2: TIA (ICD-435.9) Lab Used: Home Monitoring  [place] Site: [place] INR POC 4.1 INR RANGE 2.5 - 3.5           Allergies: No Known Drug Allergies  Anticoagulation Management History:      Negative risk factors for bleeding include an age less than 85 years old.  The bleeding index is 'low risk'.  Positive CHADS2 values include History of CHF and History of HTN.  Negative CHADS2 values include Age > 28 years old.  The start date was 04/10/1997.  His last INR was 2.3 ratio.  Anticoagulation responsible provider: Verl Blalock MD, Marcello Moores.  INR POC: 4.1.  Exp: 11/2010.    Anticoagulation Management Assessment/Plan:      The patient's current anticoagulation dose is Coumadin 10 mg  tabs: use as directed by coumadin clinic.  The target INR is 2.5 - 3.5.  The next INR is due 04/19/2010.  Anticoagulation instructions were given to patient.  Results were reviewed/authorized by [name], RN, BSN.         Prior Anticoagulation Instructions: INR 2.9 Continue 10mg s daily. Recheck in one week. [facility]   Spoke with pt, advised to continue on same dosage 10mg  daily.  Recheck in 1 week. [name] RN  April 12, 2010 3:53 PM   Current Anticoagulation Instructions: INR 4.1  Attempted to call pt with results.  LMOM TCB for results.  [name] RN  April 25, 2010 3:32 PM  Pt called back [facility] he skipped Saturdays dosage of Coumadin and then resumed same dosage 10mg  daily.  Pt is going to recheck this week. [name] RN  April 25, 2010 4:59 PM

## 2010-05-12 NOTE — Medication Information (Signed)
Summary: 49 Orders   27 Orders   Imported By: Sallee Provencal 03/18/2010 11:06:49  _____________________________________________________________________  External Attachment:    Type:   Image     Comment:   External Document

## 2010-05-12 NOTE — Progress Notes (Signed)
Summary: talk to nurse  Phone Note Call from Patient Call back at Home Phone [phone]   Caller: Patient Summary of Call: per pt states that he is having some problems with his CHF. pt having to take more lasix and had some arrythmia on saturday. wants to know if he needs to be seen. Initial call taken by: Lorraine Lax,  May 02, 2010 8:21 AM  Follow-up for Phone Call        pt states that he seems to not be able to take enough lasix to stay ahead of the chf and is very shob and weak. he will be in town next 2 weeks and feels like he needs to be seen.  also states he had some odd rhythm on saturday.  will work pt in tomorrow 345. Follow-up by: Joan Flores RN,  May 02, 2010 12:43 PM

## 2010-05-12 NOTE — Medication Information (Signed)
Summary: Coumadin Clinic  Anticoagulant Therapy  Managed by: [name], RN, BSN Referring MD: [name] MD PCP: dr Bryn Gulling MD: Stanford Breed MD, Aaron Edelman Indication 1: Mitral Valve Replacement (ICD-V43.3) Indication 2: TIA (ICD-435.9) Lab Used: Home Monitoring  [place] Site: [place] INR POC 2.9 INR RANGE 2.5 - 3.5  Dietary changes: no    Health status changes: no    Bleeding/hemorrhagic complications: no    Recent/future hospitalizations: no    Any changes in medication regimen? no    Recent/future dental: no  Any missed doses?: no       Is patient compliant with meds? yes      Comments: Self-tester  Allergies: No Known Drug Allergies  Anticoagulation Management History:      His anticoagulation is being managed by telephone today.  Negative risk factors for bleeding include an age less than 63 years old.  The bleeding index is 'low risk'.  Positive CHADS2 values include History of CHF and History of HTN.  Negative CHADS2 values include Age > 63 years old.  The start date was 04/10/1997.  His last INR was 2.3 ratio.  Anticoagulation responsible provider: Stanford Breed MD, Aaron Edelman.  INR POC: 2.9.    Anticoagulation Management Assessment/Plan:      The patient's current anticoagulation dose is Coumadin 10 mg  tabs: use as directed by coumadin clinic.  The target INR is 2.5 - 3.5.  The next INR is due 04/19/2010.  Anticoagulation instructions were given to patient.  Results were reviewed/authorized by [name], RN, BSN.  He was notified by [name] RN.         Prior Anticoagulation Instructions: INR 3.5  Spoke with pt.  Continue same dose of 1 tablet every day.  Recheck INR in 2 weeks.   Current Anticoagulation Instructions: INR 2.9 Continue 10mg s daily. Recheck in one week. [facility]   Spoke with pt, advised to continue on same dosage 10mg  daily.  Recheck in 1 week. [name] RN  April 12, 2010 3:53 PM

## 2010-05-12 NOTE — Assessment & Plan Note (Signed)
Summary: shob/arrythmia   Allergies: No Known Drug Allergies   PPM Specifications Following MD:  [name], MD     PPM Vendor:  St Jude     PPM Model Number:  [id number]     PPM Serial Number:  [id number] PPM DOI:  07/22/2008     PPM Implanting MD:  [name], MD  Lead 1    Location: RA     DOI: 07/22/2008     Model #: [id number]     Serial #: [id number]     Status: active Lead 2    Location: RV     DOI: 07/22/2008     Model #: [id number]     Serial #: [id number]     Status: active  Magnet Response Rate:  BOL 100 ERI 85  Indications:  CHF; HOCM   PPM Follow Up Pacer Dependent:  No      Episodes Coumadin:  Yes  Parameters Mode:  DDD     Lower Rate Limit:  60     Upper Rate Limit:  125 Paced AV Delay:  200     Sensed AV Delay:  200  Patient Instructions: 1)  Your physician has recommended you make the following change in your medication: Zoaroxolyn 2.5mg  three times a week.

## 2010-05-12 NOTE — Progress Notes (Signed)
  Phone Note Outgoing Call   Call placed by: [name], RN, BSN,  April 22, 2010 3:19 PM Call placed to: Patient Details for Reason: INR check for this week Summary of Call: Telephoned pt to ask if he has had his INR drawn for the week, LMOM to return call.  Initial call taken by: [name], RN, BSN,  April 22, 2010 3:21 PM

## 2010-05-16 ENCOUNTER — Other Ambulatory Visit: Payer: Self-pay

## 2010-05-16 ENCOUNTER — Telehealth: Payer: Self-pay | Admitting: Internal Medicine

## 2010-05-17 ENCOUNTER — Encounter: Payer: Self-pay | Admitting: Internal Medicine

## 2010-05-17 ENCOUNTER — Other Ambulatory Visit: Payer: Self-pay | Admitting: Internal Medicine

## 2010-05-17 ENCOUNTER — Other Ambulatory Visit: Payer: Self-pay

## 2010-05-17 ENCOUNTER — Encounter: Payer: Self-pay | Admitting: Cardiology

## 2010-05-17 ENCOUNTER — Other Ambulatory Visit (INDEPENDENT_AMBULATORY_CARE_PROVIDER_SITE_OTHER): Payer: Managed Care, Other (non HMO)

## 2010-05-17 DIAGNOSIS — R0602 Shortness of breath: Secondary | ICD-10-CM

## 2010-05-17 DIAGNOSIS — I5032 Chronic diastolic (congestive) heart failure: Secondary | ICD-10-CM

## 2010-05-17 LAB — BASIC METABOLIC PANEL
BUN: 17 mg/dL (ref 6–23)
CO2: 30 mEq/L (ref 19–32)
Calcium: 8.9 mg/dL (ref 8.4–10.5)
Chloride: 96 mEq/L (ref 96–112)
Creatinine, Ser: 1 mg/dL (ref 0.4–1.5)
Glucose, Bld: 109 mg/dL — ABNORMAL HIGH (ref 70–99)

## 2010-05-18 ENCOUNTER — Telehealth: Payer: Self-pay | Admitting: Internal Medicine

## 2010-05-18 NOTE — Medication Information (Signed)
Summary: Coumadin Clinic  Anticoagulant Therapy  Managed by: [name], RN, BSN Referring MD: [name] MD PCP: dr Bryn Gulling MD: Percival Spanish MD, Jeneen Rinks Indication 1: Mitral Valve Replacement (ICD-V43.3) Indication 2: TIA (ICD-435.9) Lab Used: Home Monitoring  [place] Site: [place] INR POC 3.3 INR RANGE 2.5 - 3.5    Bleeding/hemorrhagic complications: no     Any changes in medication regimen? yes       Details: Started on Zaroxlyn 3 x weekly.     Any missed doses?: no       Is patient compliant with meds? yes      Comments: Self-tester  Allergies: No Known Drug Allergies  Anticoagulation Management History:      His anticoagulation is being managed by telephone today.  Negative risk factors for bleeding include an age less than 20 years old.  The bleeding index is 'low risk'.  Positive CHADS2 values include History of CHF and History of HTN.  Negative CHADS2 values include Age > 55 years old.  The start date was 04/10/1997.  His last INR was 2.3 ratio.  Anticoagulation responsible provider: Percival Spanish MD, Jeneen Rinks.  INR POC: 3.3.    Anticoagulation Management Assessment/Plan:      The patient's current anticoagulation dose is Coumadin 10 mg  tabs: use as directed by coumadin clinic.  The target INR is 2.5 - 3.5.  The next INR is due 05/15/2010.  Anticoagulation instructions were given to patient.  Results were reviewed/authorized by [name], RN, BSN.  He was notified by [name] RN.         Prior Anticoagulation Instructions: INR 3.5  Continue same dose of 1 tablet every day.  Recheck INR in 1 week.   Current Anticoagulation Instructions: INR 3.3 LMOM for pt to call .  Continue 10mg s daily. Recheck in one week.  [name], RN, BSN May 09, 2010 4:00pm  [facility] [name], RN, BSN  May 10, 2010 9:40 AM Spoke with pt advised of above dosage instructions.  [name] RN  May 10, 2010 11:09 AM

## 2010-05-18 NOTE — Cardiovascular Report (Signed)
Summary: Office Visit   Office Visit   Imported By: Sallee Provencal 05/11/2010 13:48:31  _____________________________________________________________________  External Attachment:    Type:   Image     Comment:   External Document

## 2010-05-19 ENCOUNTER — Telehealth: Payer: Self-pay | Admitting: Internal Medicine

## 2010-05-26 ENCOUNTER — Telehealth (INDEPENDENT_AMBULATORY_CARE_PROVIDER_SITE_OTHER): Payer: Self-pay | Admitting: *Deleted

## 2010-05-26 NOTE — Progress Notes (Addendum)
Summary: sob  ask SCK  Phone Note Call from Patient Call back at [facility] Phone [phone]   Caller: Patient Summary of Call: Pt having sob Initial call taken by: Delsa Sale,  May 19, 2010 4:46 PM  Follow-up for Phone Call        has been having SOB for months - not any better, most recent treatment has not helped. Pt states he is not in acute distress at this time however wants to be seen ASAP as he has many questions and concerns.  He is also needing a letter or note for work stating his limitations because he doesn't feel like he is able to perform his job effectively any longer.  Pt aware will look for an appt and call him back.  He is in agreement.  Will discuss with Dr [name] as to how soon he needs to be seen Follow-up by: Sim Boast, RN,  May 19, 2010 5:23 PM  Additional Follow-up for Phone Call Additional follow up Details #1::        Dr [name] to call pt. Additional Follow-up by: Sim Boast, RN,  May 20, 2010 12:19 PM     Appended Document: sob  ask SCK called to speak pt;  he is feeling better on his regime of his diuretic.  he thinks that not traveling may be key as it allows him to take his diuretics on a regular basis

## 2010-05-26 NOTE — Progress Notes (Signed)
Summary: pt having sob/weakness since yesterday  Phone Note Call from Patient   Caller: Patient after 930a [phone] Reason for Call: Talk to Nurse Summary of Call: sob/weak yesterday, worse today was told potassium/sodium/magnesium were low/co2 high lab report to be faxed from baboff's office, he also told him he didn't see any signs of chf which he thought he had-pt at a loss as to what to do at this point-pls advise Initial call taken by: Lorenda Hatchet,  May 16, 2010 8:36 AM  Follow-up for Phone Call        Called patient back and he informed  me that he saw his primary care doctor and had labs done on 2/2 and was advised  that his potassium,sodium, and mag levels were low, not in any heart failure and probably dehydrated. Therefore, he was advised to hold his dieuretics on Saturday. On Sunday he took Torsemide 40 mg and Lasix 80 mg and KCl 60 meg. Today he took Torsemide40mg  and is feeling SOB. I advised him to go ahead and take his usual dose of Lasix 80mg  and kcl and will discuss with Dr.Klein. He has not had any Zaroxolyn in 1 week.  Francetta Found, RN, BSN  May 16, 2010 10:34 AM     Additional Follow-up for Phone Call Additional follow up Details #1::        Discussed above with Dr.Klein. The plan is for the patient to take his usual dosing of dieuretics and KCL today, continue to hold the Zaroxolyn and come in for a bmet and bnp. He will have labs done in this office on 2/7. Francetta Found, RN, BSN  May 16, 2010 11:14 AM

## 2010-05-26 NOTE — Medication Information (Signed)
Summary: Coumadin Clinic  Anticoagulant Therapy  Managed by: Porfirio Oar, PharmD Referring MD: [name] MD PCP: dr Bryn Gulling MD: Stanford Breed MD, Aaron Edelman Indication 1: Mitral Valve Replacement (ICD-V43.3) Indication 2: TIA (ICD-435.9) Lab Used: Home Monitoring  [place] Site: [place] INR POC 2.6 INR RANGE 2.5 - 3.5  Dietary changes: yes       Details: increased greens  Health status changes: no    Bleeding/hemorrhagic complications: no    Recent/future hospitalizations: no    Any changes in medication regimen? yes       Details: off zaroxolyn  Recent/future dental: no  Any missed doses?: no       Is patient compliant with meds? yes       Allergies: No Known Drug Allergies  Anticoagulation Management History:      The patient is taking warfarin and comes in today for a routine follow up visit.  Negative risk factors for bleeding include an age less than 72 years old.  The bleeding index is 'low risk'.  Positive CHADS2 values include History of CHF and History of HTN.  Negative CHADS2 values include Age > 18 years old.  The start date was 04/10/1997.  His last INR was 2.3 ratio.  Anticoagulation responsible provider: Stanford Breed MD, Aaron Edelman.  INR POC: 2.6.  Cuvette Lot#: [id number].  Exp: 05/2011.    Anticoagulation Management Assessment/Plan:      The patient's current anticoagulation dose is Coumadin 10 mg  tabs: use as directed by coumadin clinic.  The target INR is 2.5 - 3.5.  The next INR is due 05/24/2010.  Anticoagulation instructions were given to patient.  Results were reviewed/authorized by Porfirio Oar, PharmD.  He was notified by Porfirio Oar PharmD.         Prior Anticoagulation Instructions: INR 3.3 LMOM for pt to call .  Continue 10mg s daily. Recheck in one week.  [name], RN, BSN May 09, 2010 4:00pm  [facility] [name], RN, BSN  May 10, 2010 9:40 AM Spoke with pt advised of above dosage instructions.  [name] RN  May 10, 2010 11:09 AM    Current Anticoagulation Instructions: INR 2.6  Continue same dose of 1 tablet every day.  Recheck INR in 1 week.

## 2010-05-26 NOTE — Progress Notes (Signed)
Summary: lab results- copy of test results  Phone Note Call from Patient   Caller: Patient [phone] Reason for Call: Talk to Nurse Summary of Call: lab results Initial call taken by: Lorenda Hatchet,  May 18, 2010 10:56 AM  Follow-up for Phone Call        per pt calling back - test results. [phone] -pt going to be in meeting, pt dropping off paperwork this pm or in the am. pt requesting a copy of his test results.  Neil Crouch  May 19, 2010 1:45 PM   Additional Follow-up for Phone Call Additional follow up Details #1::        copy of results will be left at the front desk Additional Follow-up by: Sim Boast, RN,  May 19, 2010 2:58 PM

## 2010-05-30 ENCOUNTER — Encounter: Payer: Self-pay | Admitting: Cardiovascular Disease

## 2010-05-30 ENCOUNTER — Encounter (INDEPENDENT_AMBULATORY_CARE_PROVIDER_SITE_OTHER): Payer: Self-pay | Admitting: Pharmacist

## 2010-05-30 LAB — CONVERTED CEMR LAB: POC INR: 3.1

## 2010-05-31 ENCOUNTER — Encounter: Payer: Self-pay | Admitting: Cardiovascular Disease

## 2010-06-01 NOTE — Progress Notes (Signed)
Summary: Follow-up on weekly INR check  Phone Note Outgoing Call Call back at [facility] Phone [phone]   Call placed by: [name], RN, BSN,  May 26, 2010 10:22 AM Call placed to: Patient Details for Reason: Follow-up INR check Summary of Call: Sp. w/ pt. to inquire about INR test that was due for Tuesday.  Pt. said that he had not done it yet this week.  However, he will "get it done when he remembers or thinks about it."  Encouraged pt. to do checks on a set day every week to help him remember.  Asked to check INR as soon as possible.[name], RN, BSN  May 26, 2010 10:24 AM  Initial call taken by: [name], RN, BSN,  May 26, 2010 10:25 AM

## 2010-06-07 ENCOUNTER — Telehealth (INDEPENDENT_AMBULATORY_CARE_PROVIDER_SITE_OTHER): Payer: Self-pay | Admitting: *Deleted

## 2010-06-07 NOTE — Letter (Signed)
Summary: [facility]  [facility]   Imported By: Marilynne Drivers 06/02/2010 14:40:26  _____________________________________________________________________  External Attachment:    Type:   Image     Comment:   External Document

## 2010-06-07 NOTE — Medication Information (Signed)
Summary: Coumadin Clinic  Anticoagulant Therapy  Managed by: Porfirio Oar, PharmD Referring MD: [name] MD PCP: dr Bryn Gulling MD: Angelena Form MD, Harrell Gave Indication 1: Mitral Valve Replacement (ICD-V43.3) Indication 2: TIA (ICD-435.9) Lab Used: Home Monitoring  [place] Site: [place] INR POC 3.1 INR RANGE 2.5 - 3.5           Allergies: No Known Drug Allergies  Anticoagulation Management History:      His anticoagulation is being managed by telephone today.  Negative risk factors for bleeding include an age less than 37 years old.  The bleeding index is 'low risk'.  Positive CHADS2 values include History of CHF and History of HTN.  Negative CHADS2 values include Age > 29 years old.  The start date was 04/10/1997.  His last INR was 2.3 ratio.  Anticoagulation responsible provider: Angelena Form MD, Harrell Gave.  INR POC: 3.1.    Anticoagulation Management Assessment/Plan:      The patient's current anticoagulation dose is Coumadin 10 mg  tabs: use as directed by coumadin clinic.  The target INR is 2.5 - 3.5.  The next INR is due 06/08/2010.  Anticoagulation instructions were given to patient/VM.  Results were reviewed/authorized by Porfirio Oar, PharmD.  He was notified by [name], RN, BSN.         Prior Anticoagulation Instructions: INR 2.6  Continue same dose of 1 tablet every day.  Recheck INR in 1 week.   Current Anticoagulation Instructions: INR 3.1  LMOM for pt. Porfirio Oar PharmD  May 31, 2010 8:57 AM   [facility] for pt to call for changes with meds, diet or bleeding if not continue current dose and recheck in one week. [name], RN, BSN  June 01, 2010 10:27 AM

## 2010-06-08 ENCOUNTER — Encounter: Payer: Self-pay | Admitting: Internal Medicine

## 2010-06-08 DIAGNOSIS — I4891 Unspecified atrial fibrillation: Secondary | ICD-10-CM

## 2010-06-08 DIAGNOSIS — G459 Transient cerebral ischemic attack, unspecified: Secondary | ICD-10-CM | POA: Insufficient documentation

## 2010-06-08 DIAGNOSIS — Z9889 Other specified postprocedural states: Secondary | ICD-10-CM | POA: Insufficient documentation

## 2010-06-08 DIAGNOSIS — Z952 Presence of prosthetic heart valve: Secondary | ICD-10-CM

## 2010-06-08 DIAGNOSIS — I38 Endocarditis, valve unspecified: Secondary | ICD-10-CM

## 2010-06-12 ENCOUNTER — Encounter: Payer: Self-pay | Admitting: Cardiology

## 2010-06-12 ENCOUNTER — Encounter: Payer: Self-pay | Admitting: Pharmacist

## 2010-06-12 LAB — CONVERTED CEMR LAB: POC INR: 3.4

## 2010-06-13 ENCOUNTER — Encounter: Payer: Self-pay | Admitting: Cardiology

## 2010-06-14 ENCOUNTER — Telehealth: Payer: Self-pay | Admitting: Internal Medicine

## 2010-06-15 ENCOUNTER — Telehealth: Payer: Self-pay | Admitting: Internal Medicine

## 2010-06-16 ENCOUNTER — Telehealth: Payer: Self-pay | Admitting: Internal Medicine

## 2010-06-16 NOTE — Progress Notes (Signed)
  Walk in Patient Form Recieved " Pt needs phone call about prescriptions" sent to [place]  June 07, 2010 4:35 PM

## 2010-06-21 NOTE — Medication Information (Signed)
Summary: Coumadin Clinic  Anticoagulant Therapy  Managed by: [name], RN, BSN Referring MD: [name] MD PCP: dr Bryn Gulling MD: Verl Blalock MD, Marcello Moores Indication 1: Mitral Valve Replacement (ICD-V43.3) Indication 2: TIA (ICD-435.9) Lab Used: Home Monitoring  [place] Site: [place] INR POC 3.4 INR RANGE 2.5 - 3.5  Dietary changes: no    Health status changes: no    Bleeding/hemorrhagic complications: no    Recent/future hospitalizations: no    Any changes in medication regimen? no    Recent/future dental: no  Any missed doses?: no       Is patient compliant with meds? yes       Current Medications (verified): 1)  Klor-Con 10 10 Meq Cr-Tabs (Potassium Chloride) .... Taking 14 Tablets Daily 2)  Torsemide 20 Mg  Tabs (Torsemide) .... Take 3 Tabs By Mouth Once Daily or As Directed 3)  Furosemide 40 Mg  Tabs (Furosemide) .... 3 Daily Or, As Directed 4)  Diovan Hct 80-12.5 Mg  Tabs (Valsartan-Hydrochlorothiazide) .... Take 1 Tablet By Mouth Once A Day 5)  Coumadin 10 Mg  Tabs (Warfarin Sodium) .... Use As Directed By Coumadin Clinic 6)  Fish Oil 1000 Mg  Caps (Omega-3 Fatty Acids) .... Take 4 Tabs By Mouth Once Daily 7)  Cvs Vitamin C 1000 Mg  Tabs (Ascorbic Acid) .... Take 2 Tabs By Mouth Once Daily 8)  Multivitamins   Tabs (Multiple Vitamin) .... Take 1 Tablet By Mouth Once A Day 9)  Low-Dose Aspirin 81 Mg  Tabs (Aspirin) .... Take 1 Tablet By Mouth Once A Day 10)  Magnesium Oxide 400 Mg  Caps (Magnesium Oxide) .... Take 6 Tablet By Mouth Once A Dayor As Directed 11)  B 12 1,000mg  .... Daily 12)  Zyloprim 300 Mg Tabs (Allopurinol) .... Take 1 Tablet By Mouth Daily  Allergies: No Known Drug Allergies  Anticoagulation Management History:      His anticoagulation is being managed by telephone today.  Negative risk factors for bleeding include an age less than 57 years old.  The bleeding index is 'low risk'.  Positive CHADS2 values include History of CHF and  History of HTN.  Negative CHADS2 values include Age > 82 years old.  The start date was 04/10/1997.  His last INR was 2.3 ratio.  Anticoagulation responsible provider: Verl Blalock MD, Marcello Moores.  INR POC: 3.4.    Anticoagulation Management Assessment/Plan:      The patient's current anticoagulation dose is Coumadin 10 mg  tabs: use as directed by coumadin clinic.  The target INR is 2.5 - 3.5.  The next INR is due 06/20/2010.  Anticoagulation instructions were given to patient/VM.  Results were reviewed/authorized by [name], RN, BSN.  He was notified by [name], RN, BSN.         Prior Anticoagulation Instructions: INR 3.1  LMOM for pt. Porfirio Oar PharmD  May 31, 2010 8:57 AM   [facility] for pt to call for changes with meds, diet or bleeding if not continue current dose and recheck in one week. [name], RN, BSN  June 01, 2010 10:27 AM    Current Anticoagulation Instructions: INR 3.4 Continue 10mg s daily. Recheck in one week.

## 2010-06-21 NOTE — Progress Notes (Signed)
Summary: need brand name Coumadin and not generic--ask Gay Filler  Phone Note Outgoing Call   Call placed by: Devra Dopp, LPN,  March  7, [id number] 2:40 PM Call placed to: Patient Summary of Call: LMTCB . NEEDING TO LET PT KNOW  SCRIPTS ARE READY TO PICK UP AT FRONT DESK. Initial call taken by: Devra Dopp, LPN,  March  7, [id number] 2:42 PM  Follow-up for Phone Call        Pt picked up hard copies of meds and Coumadin is wrong need a new prescription,it is sign in wrong place need brand not generic call pt at [place]  June 16, 2010 9:52 AM  Pt returning call about meds Delsa Sale  June 16, 2010 10:24 AM  Additional Follow-up for Phone Call Additional follow up Details #1::        Spoke with pt.  He will come to office tomorrow to pick up new prescription form Brand name Coumadin.   Porfirio Oar PharmD  June 16, 2010 4:15 PM

## 2010-06-24 ENCOUNTER — Telehealth (INDEPENDENT_AMBULATORY_CARE_PROVIDER_SITE_OTHER): Payer: Self-pay | Admitting: *Deleted

## 2010-06-24 LAB — PROTIME-INR
INR: 1.04 (ref 0.00–1.49)
Prothrombin Time: 13.8 seconds (ref 11.6–15.2)

## 2010-06-24 LAB — BASIC METABOLIC PANEL
GFR calc Af Amer: >60 mL/min (ref >60)
GFR calc non Af Amer: >60 mL/min (ref >60)
Potassium: 4.1 mEq/L (ref 3.5–5.1)
Sodium: 137 mEq/L (ref 135–145)

## 2010-06-25 ENCOUNTER — Encounter: Payer: Self-pay | Admitting: Internal Medicine

## 2010-06-25 LAB — CONVERTED CEMR LAB: POC INR: 3.3

## 2010-06-27 ENCOUNTER — Encounter: Payer: Self-pay | Admitting: Internal Medicine

## 2010-06-28 ENCOUNTER — Other Ambulatory Visit: Payer: Self-pay | Admitting: *Deleted

## 2010-06-28 ENCOUNTER — Telehealth: Payer: Self-pay | Admitting: Internal Medicine

## 2010-06-28 DIAGNOSIS — I509 Heart failure, unspecified: Secondary | ICD-10-CM

## 2010-06-28 DIAGNOSIS — Z79899 Other long term (current) drug therapy: Secondary | ICD-10-CM

## 2010-06-28 NOTE — Progress Notes (Signed)
Summary: letter was sent on 2/9 -   Phone Note From Other Clinic   Caller: [place] office # [phone].  Request: Talk with Nurse Summary of Call: letter was sent on 05/19/10.  regarding pt status to work. fax # [phone].  Initial call taken by: Neil Crouch,  June 14, 2010 8:44 AM  Follow-up for Phone Call        i am guilty will wokr of this weekend Follow-up by: Nikki Dom, MD, [facility],  June 24, 2010 9:53 PM

## 2010-06-28 NOTE — Telephone Encounter (Signed)
LMOVM  Re medications  Horton Chin RN

## 2010-06-28 NOTE — Progress Notes (Signed)
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 to see if had tested INR (self tester from home.)   Pt. said that "you will get it when I get it."  Upon further questioning pt. said that he "will do it when he remembers.  Usually it is so stable we don't worry about it."  I encouraged him to send it in the next week if possible.  Pt. states that he knows risks of infrequent testing/dosing. Initial call taken by: Danella Penton,  June 24, 2010 2:32 PM

## 2010-06-28 NOTE — Telephone Encounter (Signed)
Patient called-3 of the Rxs given to him on 06/14/10 was written wrong per patient.  Torsemide should have been for 4 tablets, Furosemide should have been 4 tablets, and Klor-Con should have been 20 tablets.  Needs new Rxs written and would like them today.  Thanks,Deyra Perdomo

## 2010-06-28 NOTE — Telephone Encounter (Signed)
Pt has 3 Rx that where written and has a wrong daily dose and quanity

## 2010-06-30 ENCOUNTER — Other Ambulatory Visit: Payer: Self-pay | Admitting: *Deleted

## 2010-06-30 MED ORDER — TORSEMIDE 20 MG PO TABS: ORAL_TABLET | ORAL | Status: DC

## 2010-06-30 MED ORDER — POTASSIUM CHLORIDE 10 MEQ PO TBCR: EXTENDED_RELEASE_TABLET | ORAL | Status: DC

## 2010-06-30 MED ORDER — FUROSEMIDE 40 MG PO TABS: ORAL_TABLET | ORAL | Status: DC

## 2010-06-30 NOTE — Telephone Encounter (Signed)
PT AWARE WILL LEAVE NEW SCRIPTS AT FRONT DESK AS WELL AS LETTER FOR WORK THAT WAS REQUESTED.

## 2010-07-01 NOTE — Assessment & Plan Note (Deleted)
[place] OFFICE NOTE  LUISA, DASARI                      MRN:          [id number] DATE:05/17/2010                            DOB:          04/15/1945   Starlyn Skeans Director of Human Resources [employer], [facility]  Dear Ms. William-Thomas:  This letter is written on behalf of Frank [name].  As we spoke about a few weeks ago, I am the cardiologist who has been and involved in his care for the last number of years.  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  He also underwent mitral valve and aortic valve surgery.  He has a pacemaker implanted.  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  The management strategies for this apart from what he has already undergone are primarily related to the use of diuretics.  The need for diuretics is variable patient to patient and Mr. Hartmann's situation.  He has required high-dose multiple diuretics a day to control his fluid balance and to allow him to be functional.  The issues currently relate to the ability to take his diuretics multiple times a day as he tries to conduct his work.  This interferes with his ability to drive.  It has been a preclusive issue for his air travel for the most part.  It also encumbers his ability to have meetings, as when he takes his diuretics he has to interrupt his meetings and his session multiple times a day to use the bathroom.  The treatment strategies for his heart failure with preserved ejection fraction do not really offer [name] much other alternatives.  Hence, this letter has been written in support of his concerns related to his ability to continue to function in his current capacity given his medical condition and his need for chronic and frequent daily doses of diuretics.  If I can be of any further assistance in this  issue, please do not hesitate to contact me.  My number is [phone].    [name], MD, [facility]    SCK/MedQ  DD: 06/26/2010  DT: 06/26/2010  Job #: [id number]

## 2010-07-07 NOTE — Medication Information (Signed)
Summary: Coumadin Clinic  Anticoagulant Therapy  Managed by: [name], RN, BSN Referring MD: [name] MD PCP: dr Bryn Gulling MD: Harrington Challenger MD, Nevin Bloodgood Indication 1: Mitral Valve Replacement (ICD-V43.3) Indication 2: TIA (ICD-435.9) Lab Used: Home Monitoring  [place] Site: [place] INR POC 3.3 INR RANGE 2.5 - 3.5          Comments: LMOM for pt to call for any changes.   Allergies: No Known Drug Allergies  Anticoagulation Management History:      His anticoagulation is being managed by telephone today.  Negative risk factors for bleeding include an age less than 13 years old.  The bleeding index is 'low risk'.  Positive CHADS2 values include History of CHF and History of HTN.  Negative CHADS2 values include Age > 55 years old.  The start date was 04/10/1997.  His last INR was 2.3 ratio.  Anticoagulation responsible provider: Harrington Challenger MD, Nevin Bloodgood.  INR POC: 3.3.    Anticoagulation Management Assessment/Plan:      The patient's current anticoagulation dose is Coumadin 10 mg  tabs: use as directed by coumadin clinic.  The target INR is 2.5 - 3.5.  The next INR is due 07/02/2010.  Anticoagulation instructions were given to patient/VM.  Results were reviewed/authorized by [name], RN, BSN.  He was notified by [name], RN, BSN.         Prior Anticoagulation Instructions: INR 3.4 Continue 10mg s daily. Recheck in one week.   Current Anticoagulation Instructions: INR 3.3 Continue 10mg s daily. Recheck in one week, LMOM. [name], RN, BSN  June 27, 2010 2:18 PM

## 2010-07-07 NOTE — Progress Notes (Signed)
Summary: NEEDS LETTER TO WORK- ASAP**  Phone Note Call from Patient   Caller: Patient Call For: nurse/doctor  Follow-up for Phone Call        Patient needs a letter from Dr. 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 He does alot of traveling and needs him to explain  his limitations due to his diuretics. He spoke with Dr. [name] 2 weeks ago regarding this & needs the letter ASAP. Will send to Dr. [name]. Pt. can come & pick up the letter next week.  Whitney Jannett Celestine RN  June 16, 2010 4:14 PM  Follow-up by: Whitney Jannett Celestine RN,  June 16, 2010 4:15 PM  Additional Follow-up for Phone Call Additional follow up Details #1::        per pt calling this am regarding letter - need asap. [place]  June 21, 2010 10:28 AM   Pt needs a letter for his job need a letter ASAP Delsa Sale  June 22, 2010 3:23 PM   PT AWARE CORRECT SCRIPTS LEFT AT FRONT DESK  AS WELL AS NOTE FOR JOB. Additional Follow-up by: Devra Dopp, LPN,  March 22, [id number] 12:25 PM

## 2010-07-14 LAB — POCT INR: INR: 3.2

## 2010-07-18 ENCOUNTER — Ambulatory Visit: Payer: Self-pay | Admitting: Pharmacist

## 2010-07-20 LAB — BASIC METABOLIC PANEL
BUN: 13 mg/dL (ref 6–23)
CO2: 30 mEq/L (ref 19–32)
Chloride: 100 mEq/L (ref 96–112)
Creatinine, Ser: 0.84 mg/dL (ref 0.4–1.5)
Potassium: 4.1 mEq/L (ref 3.5–5.1)

## 2010-08-04 ENCOUNTER — Encounter: Payer: Self-pay | Admitting: *Deleted

## 2010-08-07 ENCOUNTER — Encounter: Payer: Self-pay | Admitting: *Deleted

## 2010-08-10 ENCOUNTER — Telehealth: Payer: Self-pay

## 2010-08-15 ENCOUNTER — Ambulatory Visit: Payer: Self-pay | Admitting: Internal Medicine

## 2010-08-15 NOTE — Telephone Encounter (Signed)
Pt completed self- check on 5/5 and sent results to coumadin clinic.

## 2010-08-21 LAB — POCT INR: INR: 2.9

## 2010-08-22 ENCOUNTER — Ambulatory Visit: Payer: Self-pay | Admitting: Cardiology

## 2010-08-23 NOTE — Assessment & Plan Note (Signed)
[facility]                             PULMONARY OFFICE NOTE   CRANSTON, ATILES                        MRN:          [id number]  DATE:12/24/2006                            DOB:          January 02, 1946    Mr. Kilburg returns for followup today.  In summary, he has hypertrophic  cardiomyopathy, status post ablation, mitral valvular replacement, and  aortic valve repair.  A CT scan in July, 2008 suggested bilateral ground  glass infiltrates but on my review, I was not very impressed with this  finding.  Pulmonary function tests did not show any evidence of airway  obstruction and have suggested mild intraparenchymal restriction with a  TLC of 86% and a DLCO of 72%.  There was no hypercarbia or hypoxia on  the blood gas at rest.   He has gained about 14 pounds in the past six weeks, and he attributes  this to be poorly compliant with Lasix since he has been on the road a  lot.  On ambulation today, his heart rate increased from 87 to 114 per  minute, and his oxygen saturation stayed at 91% on room air.   PHYSICAL EXAMINATION:  Blood pressure 128/94, heart rate 87 per minute,  weight 370 pounds.  Oxygen saturation 95% on room air.  CVS:  S1 and S2 normal.  CHEST:  Clear to auscultation.  EXTREMITIES:  Edema 1+.   IMPRESSION:  1. I doubt interstitial lung disease.  I feel that his dyspnea may be      secondary to his morbid obesity and his cardiomyopathy.  Clearly,      this does improve with diuretics.  We may just have to aim for a      weight of about 350 to 355 pounds and increase his diuretics if his      weight increases.  2. A high resolution CT chest will be repeated in January, 2009.  3. I have asked him to start an exercise program at home.  He was not      interested in cardiopulmonary rehab since this involves group      exercises.  4. He very likely has obstructive sleep apnea but prefers a home      study.  Hopefully, we will obtain home  sleep monitors in the next      few weeks.  He would be an ideal candidate for this.     Rigoberto Noel, MD  Electronically Signed    RVA/MedQ  DD: 12/24/2006  DT: 12/24/2006  Job #: [id number]   cc:   [name], MD, [facility]  Fransico Him, M.D.

## 2010-08-23 NOTE — Letter (Signed)
October 03, 2006    Frank [name], M.D.  [address], [place], [place] [address]   RE:  Frank, [name]  MRN:  [id number]  /  DOB:  11/04/1945   Dear Frank [name],   It was a pleasure to see Frank [name].  I gather there are some issues  as to where he will get his long-term care, but thank you very much for  asking [name] to see [name].  As you know, he has a history of atrial  arrhythmias and underwent an ablation procedure up in [place] a bunch of  years ago, where they found other atrial arrhythmias.  Apparently, there  was damage to his aortic valve as part of that procedure, and he  underwent aortic valve repair and mitral valve replacement relatively  acutely.  This occurred in the setting of hypertrophic cardiomyopathy,  so they did a septal myectomy.   He had been doing really well for months and months until just recently,  where he has noted progressive shortness of breath and swelling.   I had seen [name] initially at your request, for Dr. Rayford [name] actually, and  then had seen [name] again almost 4 years ago for reasons that are not  entirely clear.  In any case, he saw you in February 2008, at which time  he noted that he had an atrial tachycardia of some kind with some kind  of AV conduction.  It is hard to interpret the electrocardiograms.  At  that point, however, he was feeling quite well.   MEDICATIONS CURRENTLY:  1. Diovan/HCT 80/12.5.  2. Furosemide 40 p.r.n.  3. Coumadin 10.  4. Aspirin 325.  5. Potassium.   He has no known drug allergies.   PHYSICAL EXAMINATION:  VITAL SIGNS:  He is hypertensive today at 158/90,  his pulse was __________, his rate was 90.  HEENT:  Demonstrated no icterus or xanthoma.  NECK:  Veins were 7-8 cm I think, though is neck is so big it is hard to  tell.  BACK:  Without kyphosis or scoliosis.  LUNGS:  Clear.  HEART:  Sounds were regular but rapid.  ABDOMEN:  Protuberant.  EXTREMITIES:  Femoral pulses were not examined.  Distal  pulses were not  palpable.  There is 3+ peripheral edema bilaterally.  NEUROLOGIC:  Grossly normal.   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   We undertook an echocardiogram today which preliminarily demonstrated  relatively good valve size.  His LV function was not easy to interpret  but was not clearly strikingly abnormal either.   IMPRESSION:  1. Hypertrophic cardiomyopathy.      a.     Status post myectomy.      b.     Status post mitral valve replacement.  2. Multiple atrial arrhythmias that have been an ongoing problem since      prior ablation procedures.  3. Aortic valve repair following damage associated with a prior      ablation procedure.  4. Morbid obesity.  5. Significant peripheral edema and congestive heart failure.   I think the first issue is symptomatic relief, and diuresis is going to  be key here.  I have increased his Lasix from 40 to 80 b.i.d., and if  that does not work we will increase it to 120 and add Zaroxolyn.   The second question is why is he in such heart failure.  It is hard  to  think that it is his arrhythmia since he had an arrhythmia present in  February 2008, unless the issue has been paroxysmal now persistent  atrial arrhythmias.  It may well be that his LV function is more  impaired than we think, and an RV-gram may be important in trying to  discern this.   For right now, I have increased his Lasix as noted.  We will have [name]  come back in about 3 weeks to reassess.  He will need a BMET at that  time, and he is to let us know on Monday whether he is diuresing on his  current dose of diuretics.   Thanks very much for asking [name] to see [name].    Sincerely,      Frank Sprang, MD, [facility]  Electronically Signed    SCK/MedQ  DD: 10/03/2006  DT: 10/04/2006  Job #: [id number]

## 2010-08-23 NOTE — Assessment & Plan Note (Signed)
[place] OFFICE NOTE   Frank [name]                        MRN:          [id number]  DATE:11/18/2007                            DOB:          1945/06/30    [name] comes in complaining today of muscle aches and weakness in  his proximal extremities.   He has a history of mitral valve replacement for severe mitral  regurgitation therapy.  There are comments about aortic valve stenosis,  and at the time of the surgery, the aortic was explored, but nothing was  done as it was inspected and found to be normal.  I presumed the jet  that gave rise to the impression of aortic stenosis must have been an  intracardiac jet in the outflow tract, which was dynamic, as no  subvalvular membranes were found.  He also carries a diagnosis of  hypertrophic cardiomyopathy in the context of morbid obesity.  He has  had multiple atrial arrhythmias and underwent an ablation attempt at the  [place], which failed, and he has had recurrent  arrhythmias noted here.  Thromboembolic risk factors are notable for  prior thromboembolism, hypertension, and mitral valve.   His current medications include Coumadin, Diovan, furosemide, and  potassium.   On examination today, his blood pressure was 126/78, his pulse was 73,  his weight was 378 pounds, up another 10 pounds in the last month and 15  pounds in the last month and a half.  His lungs were clear.  His heart  sounds were regular today.  Extremities were with 2+ edema.  The skin  was warm and dry.   Electrocardiogram was difficult for me to interpret.  There was evidence  of group beating with a 3:2 pattern.  There were PP intervals that could  be discerned at a rate of close to 800 milliseconds, and I could not  sure that there was an interspersed P-wave, although the group beating  would suggest that there was in fact the case.  This was then  transitioned to sinus rhythm with first-degree AV block and right bundle  with right axis deviation.   IMPRESSION:  1. Muscle aches proximally with weakness.  2. Hypertrophic cardiomyopathy - question obstructive.  3. Previous evidence of systolic anterior motion of the mitral valve      status post mitral valve placement for multiple atrial arrhythmias      status post ablation attempt at [place], now with relative rate      control and without significant associated symptoms.   Frank [name] from a cardiovascular point of view has done pretty well.  For the obesity issue, we addressed again and the importance of exercise  was discussed.  We will plan to check  muscle enzymes today and a sed rate because of his symptoms of muscle  aches and weakness.   We will see him again in 3 months' time to follow his arrhythmia and  sinus.     [name], MD, [facility]  Electronically Signed    SCK/MedQ  DD: 11/18/2007  DT: 11/19/2007  Job #: [id number]

## 2010-08-23 NOTE — Discharge Summary (Signed)
NAMEJERRAMY, Frank [name]               ACCOUNT NO.:  [id number]   MEDICAL RECORD NO.:  [id number]          PATIENT TYPE:  OBV   LOCATION:  [id number]                         FACILITY:  [facility]   PHYSICIAN:  Frank Sprang, MD, FACCDATE OF BIRTH:  09/09/1945   DATE OF ADMISSION:  07/22/2008  DATE OF DISCHARGE:  07/23/2008                               DISCHARGE SUMMARY   This patient has no known drug allergies.   TIME FOR THIS DICTATION AND EXPLANATION TO THE PATIENT:  Greater than 45  minutes.   FINAL DIAGNOSES:  1. Discharging day 1, status post implant of a St. Jude ACCENT DR RF      dual-chamber pacemaker.  2. Carotid pressure procedure - preprocedure determines first-degree,      atrioventricular block.  Then, proceeded with a St. Jude dual-      chamber pacemaker implant.  3. Implant of pacemaker will help symptoms of fatigue and easily      inducible dyspnea with exertion.   SECONDARY DIAGNOSES:  1. History of hypertrophic cardiomyopathy, status post myomectomy  2. History of left and right-sided atrial arrhythmias, status post      ablation procedures in the past.  3. Status post aortic valve repair, mitral valve replacement with      mechanical valve in April 19, [id number].  4. Diastolic congestive heart failure.  5. Right bundle-branch block.  6. Ejection fraction 55%.  7. Morbid obesity.  8. Possible obstructive sleep apnea.  9. Hypertension.  10.New York Heart Association Class IIB diastolic congestive heart      failure.   PROCEDURE:  1. On July 22, 2008, carotid pressure procedure to determine whether      the patient had prolonged, atrioventricular block or atrial      arrhythmias, recurrent.  2. Implant of a St. Jude dual-chamber ACCENT RF DR pacemaker, Dr.      Virl [name].  The patient had no postprocedural complications.  No      hematoma at the pocket.  Chest x-ray shows the leads are in      appropriate position.  The device has been interrogated all leads  within normal limits.  Mobility of the left arm and incision care      have been discussed with the patient.   BRIEF HISTORY:  [name] is a 65 year old male.  He says he is  usually tired and short of breath.  It is possible that he has residual  atrial arrhythmias, which are contributing to inefficient cardiac  output.  These would be most notably recurrence of atrial flutter or  atrial fibrillation, but it is also possible the patient has profound  first-degree AV block.  It makes a difference as to which of these  findings, we can determine prior to a procedure.  If he has atrial  arrhythmias, he will need electrophysiology study or an antiarrhythmic,  if profound first-degree AV block or pacemaker.  Dr. Caryl [name] will assess  the patient prior to any electrophysiology procedure by proceeding with  carotid pressure study.  This will be done in short-stay C.   Of  note, the patient says he is usually tired and easily short of breath  and is perhaps possible that the outcome of today's procedure will help  him with this.   HOSPITAL COURSE:  The patient presents with fatigue and shortness of  breath with minimal exertion.  The patient did have a carotid pressure  study, which determined that he has profound first-degree AV block.  The  patient was taken to the electrophysiology lab and a dual-chamber  pacemaker was implanted as dictated above.  The patient has had no  postprocedural complications and will discharge postprocedure day #1.  Of note, he did have increased diuretic therapy here and he will  discharge on his own diuretic cocktail, which is comprised of  torsemide 20 mg both in the morning and at noon and furosemide 40 mg  both in the morning and at noon.  These will be done for the next 5 days  and he will come to the office at [name] on Monday, July 27, 2008, for weight check.   The patient's weight on July 23, 2008, was 384 pounds.   The patient is asked  to keep his incision dry for the next 7 days, to  sponge bathe until Wednesday, July 29, 2008.   MEDICATIONS AT DISCHARGE:  1. Diovan HCT 80/12.5 one tab daily.  2. Torsemide 20 mg twice daily and furosemide 40 mg twice daily for      the next 5 days.  3. Coumadin 10 mg daily.  4. Fish oil 1000 mg 4 tabs daily.  5. K-Dur 10 mEq to be taken at morning, noon, and night depending on      the level of diuretic therapy.  6. Multivitamin daily.  7. Enteric-coated aspirin 81 mg daily.  8. Magnesium oxide 400 mg daily.   Follow up at [facility] in [place].   1. Weight check on Monday, July 27, 2008, at 4 o'clock.  2. Thursday, August 06, 2008, at 1 o'clock.  An echocardiogram with AV      optimization to be followed by Frank [name] at 2 o'clock.  3. To see Dr. Caryl [name] on Tuesday, December 08, 2008, at 9 o'clock.   LABORATORY STUDIES PERTINENT TO THIS ADMISSION:  White cells 7.1,  hemoglobin 14, hematocrit 40.3, and platelets are 232.  Sodium 142,  potassium 3.5, chloride 104, carbonate 30, BUN is 15, creatinine is 1,  and glucose 123.  Protime 32.9 and INR 3.3.      Frank [name], [place]      Frank Sprang, MD, [name]  Electronically Signed    GM/MEDQ  D:  07/23/2008  T:  07/24/2008  Job:  [id number]   cc:   Frank [name]. Frank [name], M.D.

## 2010-08-23 NOTE — Op Note (Signed)
NAMEFRANCICO, PALK               ACCOUNT NO.:  [id number]   MEDICAL RECORD NO.:  [id number]          PATIENT TYPE:  INP   LOCATION:  3707                         FACILITY:  [facility]   PHYSICIAN:  [name], MD, FACCDATE OF BIRTH:  07-Dec-1945   DATE OF PROCEDURE:  07/22/2008  DATE OF DISCHARGE:                               OPERATIVE REPORT   PREOPERATIVE DIAGNOSES:  Congestive heart failure, hypertrophic  cardiomyopathy, prior valve replacement surgery, and prolonged first-  degree atrioventricular block.   POSTOPERATIVE DIAGNOSES:  Congestive heart failure, hypertrophic  cardiomyopathy, prior valve replacement surgery, and prolonged first-  degree atrioventricular block.   PROCEDURE:  Dual-chamber pacemaker implantation; contrast venogram;  __________.  Preprocedural carotid pressure had demonstrated that the  rhythm issue was indeed first-degree AV block and not 2:1 tachycardia.  Because of this, the patient was then submitted for pacemaker  implantation.  After routine prep and drape of the left upper chest,  contrast was injected via the left arm which identified the course of  patency of the extrathoracic left subclavian vein.  This having been  accomplished, incision was made and carried down to the very deep  prepectoral fascia using sharp dissection and electrocautery.  The  pocket was formed similarly.  At that point, attention was turned  gaining access to the extrathoracic left subclavian vein which was  accomplished without difficulty without the aspiration of air or  puncture of the artery.  Two separate venipunctures were accomplished.  Guidewires were placed and retained.  Sequentially, 7-French sheaths  were placed and through these were passed a St. Jude [id number] 58-cm active  fixation ventricular lead, serial number [id number] and a 58 cm 2088TC  atrial lead, serial number [id number].  Under fluoroscopic guidance, they  were manipulated at the right ventricular  apex in the right atrial  appendage remnant respectively where the bipolar R-wave was somewhat  about 25 mV.  The pacing impedance was 1000 ohms, a threshold 0.9 volts  at 0.5 milliseconds.  Current threshold I am not calculating.  Injury  current was brisk and there was no diaphragmatic pacing at 10 volts.  This lead was deployed and then by the time we were ready to close, the  R-wave had gone from 20 to 2.6.  We looked at the current of injury and  it remained brisk.  We however elected to reposition the lead.  There  was a suggestion from the [employer] rep that this behavior something that  has been seen with this new lead.  We repositioned the lead in its final  location.  The R-wave was 20 initially and then 11 after about 10  minutes, the impedance was 1067 ohms, a threshold 0.9 volts at 0.5  milliseconds.  Again, the current of injury was very brisk and there was  no diaphragmatic pacing at 10 volts.  In between the two deployments of  the ventricular lead, the atrial lead was placed with a P-wave at 3.4,  pace impedance of 511 ohms, a threshold of 1 volt at 0.5.  The current  of injury was also brisk and  there was no diaphragmatic pacing at 10  volts.   The ventricular lead was marked with a tie.  The leads were secured to  the prepectoral fascia and a hemostatic stitch was placed.   I should note that prior to insertion of the lead and following access,  we measured a CVP which was greater than 25 cm.   The pocket was copiously irrigated with antibiotic containing saline  solution.  The leads were attached to a [employer] RF pulse generator,  serial number [id number], serial number [id number].  Through the device, P  synchronous pacing was identified.  The pocket was copiously irrigated.  FloSeal was used as the patient's INR was 2.8.  The leads and the pulse  generator were placed in the pocket and secured to the prepectoral  fascia.  The wound was closed in three  layers in a normal  fashion.  The wound was flushed, dried and a benzoin  and Steri-Strip dressing was applied.  Needle counts, sponge counts and  instrument counts were correct at the end of the procedure according to  the staff.  The re-interrogated R wave was about 9-10 mV.  The patient  tolerated the procedure without apparent complication.      [name], MD, [facility]  Electronically Signed     SCK/MEDQ  D:  07/22/2008  T:  07/23/2008  Job:  [id number]

## 2010-08-23 NOTE — Assessment & Plan Note (Signed)
[place] OFFICE NOTE   RIYADH, MAZANEC                        MRN:          [id number]  DATE:10/31/2006                            DOB:          1945-06-14    This is a 65 year old married white male patient of Dr. [name] who has  hypertrophic cardiomyopathy, and status post St. Jude mitral valve  replacement.  The patient saw Dr. [name] on October 10, 2006 for increased  dyspnea, treated with increasing his diuretics.  The patient did lose  weight, but continued to have symptoms of shortness of breath, so  Myoview and echo were ordered.  Stress Myoview showed probable normal  perfusion with minimal soft tissue attenuation in the distal inferior  wall.  No evidence of significant ischemia or scar.  A 2D echo showed  overall normal LF function, ejection fraction of 55%, and adequate left  ventricular regional motion.  Wall thickness was moderately increased.  Aortic valve thickness mildly increased.  Mild AI.  Mild ascending  aortic dilatation.  St. Jude medical prosthesis appeared normal.  No  significant perivalvular mitral regurgitation.  Mitral gradient 8 mm of  mercury.  Mitral vulvaria pressure half time 2.37 cm squared.  LA was  moderately dilated.  RA was moderate to markedly dilated.   The patient continues to complain of shortness of breath.  He says he  gets out of breath if he walks a significant distance or at a faster  pace, or going up a flight of stairs he has to stop half way up the  stairs.  He says there has been a significant change in the past 3  weeks, and he does not feel any better.  He has not been taking his  Lasix except on weekends, or if he is off a day.  He says it makes him  run to the bathroom too much while at work, and he cannot cope with the  dose.  Because of this, his weight has increased 6 pounds since he was  here last.   CURRENT MEDICATIONS:  1. Diovan/hydrochlorothiazide  80/12.5 mg daily.  2. Furosemide.  He takes 40 mg 2 in the morning on weekends only now.  3. Multivitamin daily.  4. Vitamin C 1000 mg daily.  5. Aspirin 81 mg daily.  6. Coumadin 10 mg daily.  7. Fish oil 1000 mg q.i.d.  8. Klor-Con 20 mEq daily.  9. Mag oxide 400 mg daily.   PHYSICAL EXAMINATION:  This is a morbidly obese 65 year old white male  in no acute distress.  Blood pressure 146/92.  Pulse 70.  Weight 368.  NECK:  Slight increase in JVD.  No HJR.  LUNGS:  Clear anterior, posterior, and lateral.  HEART:  Regular rate and rhythm at 70 beats per minute.  Normal S1 and  S2.  Crisp valvular click with 1/6 systolic ejection murmur at the apex.  ABDOMEN:  Soft without organomegaly, masses, lesions, or abnormal  tenderness.  EXTREMITIES:  Plus 2 edema on the right plus 1 on the left.   IMPRESSION:  1.  Dyspnea on exertion.  Did not improve with diuretics.  Negative      stress Myoview scan.  2. Hypertrophic cardiomyopathy.  3. St. Jude mitral valve replacement.  4. 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  5. Morbid obesity.  6. Question of obstructive sleep apnea.  Has refused sleep study at      this time.   PLAN:  I have talked to the patient at length about using his diuretics  on a regular basis, and even splitting them up 40 mg b.i.d., and have  also given him a trial prescription for Demadex.  I told him he needs to  take these daily both for his shortness of breath and hypertension.  I  have told him he must lose weight, which he does not seem convinced that  this is a problem.  He does not want to proceed with a sleep study, as  he feels like this is our cake, and does not think he could ever sleep  with a mask on his face or proceed with the study itself.  Despite being  on Coumadin, a thought would be possible pulmonary embolus, so I will  talk to the patient about possibly doing lower extremity Dopplers as  well as D-dimer and CT scan of his chest.   While he is on Coumadin, I  doubt this is the real issue, but the patient is looking for a clear  diagnosis to his symptoms.  He will see Dr. [name] back in 1 month.      Ermalinda Barrios, PA-C  Electronically Signed      Marijo Conception. Verl Blalock, MD, [facility]  Electronically Signed   ML/MedQ  DD: 10/31/2006  DT: 10/31/2006  Job #: [id number]

## 2010-08-23 NOTE — Assessment & Plan Note (Signed)
[name] OFFICE NOTE   BARACK, KLEFFNER                        MRN:          [id number]  DATE:06/17/2008                            DOB:          28-Oct-1945    Frank [name] returns for review of his CPX test.  He continues to have  progressive exercise intolerance and weakness.  His CPX demonstrated a  VO2 max of 12.2 representing 66.4% predicted. his VO2 adjusted for body  weight was 25.9 His slope was 34 and his O2 pulse was 17, 88% of  predicted.  It was felt that his primary limitations were obesity and  ventilatory   Review of his old records reminds [name] that he is status post aorticvalve  repair and mitral valve surgery in [place] in the context of  hypertrophic cardiomyopathy with a prior myectomy.  He had undergone 2  ablation procedures in [place], one on the right side and one on the  left side.  As I review the electrocardiograms that we have in our chart  dating back to 2003, there is only one that I can be  certain is sinus.  The others all have a much lower amplitude pwave .  There is some group  beating to suggest sinus rhythm with very long PR intervals probably  sinus rhythm with  wenckebach periodicity and there are also others that clearly represent  atrial flutter at a rate of about 200 beats per minute.   Most recent assessment of ejection fraction was in June 2008  demonstrated normal left ventricular function w moderate concentric  hypertrophy.   On examination, today his blood pressure was 160/79, his pulse was 87,  his weight was 388 pounds which represents a 10-pound weight gain in the  last 6 months, 5 pounds weight gain in the last 6 weeks.  His lungs were  clear.  Heart sounds were regular with a mechanical S1.  The abdomen was  severely protuberant, and the extremities had trace edema.   IMPRESSION:  1. Progressive exercise intolerance.  2. Hypertrophic cardiomyopathy  status post myectomy, mitral valve      replacement, and aortic valve repair.  3. History of recurrent atrial arrhythmias status post right-sided      ablation and left-sided ablation at [place] some years ago.  4. Electrocardiogram inconclusive to me for first-degree      atrioventricular block - profound versus 2:1 conduction though I      suspect it is the former.  5. Morbid obesity with cardiopulmonary exercise demonstrating      functional impairment related to ventilatory limitations and      obesity.  6. Some sleep disordered breathing without daytime somnolence or      morning fatigue.   Frank [name] symptoms continue to worsen.  The 2 potential  contributions that I can see would be recurrent atrial arrhythmias, and  I cannot be sure of this from the surface,  and be in the event that he  is in sinus rhythm,  really profound first-degree AV block  this being  particularlyproblematic in the setting of  his hypertrophic heart  disease. Both of these result in the loss of atrial kick.  We have  discussed the potential option of a left subclavian venous diagnostic  study to see if his atrium is 1:1 with the ventricle.  In the event that  it were not, we would proceed with cardioversion and consideration of  antiarrhythmic drug therapy presumably with Tikosyn. We have discussed  potential proarrhythmic issues as well as other side effects .  Alternatively in the event that he was found to be a sinus rhythm with  profound first-degree AV block we would proceed with pacemaker  implantation.  We discussed potential risks of that including but not  limited to infection, device malfunction, and lead dislodgement.  He  understands this and will let us know in  what timeframe he would like  to proceed.     [name], MD, [facility]  Electronically Signed    SCK/MedQ  DD: 06/17/2008  DT: 06/18/2008  Job #: [id number]

## 2010-08-23 NOTE — Assessment & Plan Note (Signed)
[place] OFFICE NOTE   Frank, [name]                        MRN:          [id number]  DATE:05/12/2008                            DOB:          04/22/1945    [name] comes in in followup for mitral valve replacement and aortic  valve repair, which occurred in the context of an ablation injury.  He  also has hypertrophic cardiomyopathy, significant obesity, and his  complaints are progressive fatigue and shortness of breath.  His fatigue  is rather relentless and occurs as the day goes on.  He notes that it is  worse over the week when he has fewer days when he can take his  diuretics twice.   He has some daytime somnolence, mostly while reading books.  He is able  to stay awake through meetings.  His wife states he snores, but not  badly.  She has not seen him stop breathing.   CURRENT MEDICATIONS:  1. Diovan HCT 80/12.5.  2. Furosemide 20 b.i.d.  3. Potassium 20 mEq 3 times a day.  4. Aspirin.  5. Coumadin.   PHYSICAL EXAMINATION:  VITAL SIGNS:  His blood pressure is 148/80; his  pulse is 77; his weight was 383, which is up 5 pounds and up 20 pounds  in the last year and half.  NECK:  Veins were flat.  LUNGS:  Clear.  HEART:  Regular with a mechanical S1 and a 2/6 murmur.  ABDOMEN:  Soft.  EXTREMITIES:  Edema 3 to 4+ edema on the left and 2+ edema on the left.  SKIN:  Warm and dry.   Electrocardiogram dated today demonstrates what is probably sinus rhythm  at a rate of 77.  The PR interval is approximately 440 milliseconds is  identified on the V6 lead and then the P-wave otherwise being seen in  another leads at a rate at about 400 milliseconds, mostly enveloped in  the T-wave.   IMPRESSION:  1. Atrial arrhythmias status post ablation.  2. Aortic valve and mitral valve injury status post repair and      replacement respectively with a mechanical valve.  3. Hypertrophic  cardiomyopathy.  4. Congestive heart failure - acute on chronic - diastolic.  5. Right bundle-branch block, right axis deviation with first-degree      atrioventricular block of quite profound proportion.  6. Obesity.  7. Possible obstructive sleep apnea.   Mr. Zubal and I had a discussion regarding number of things related to  his progressive dyspnea and fatigue, which are his major complaints.  I  suspect that they are in large part heart failure as suggested by the  correlation with diuresis.  He still is significantly volume overloaded  and we would like to push his diuretics and to that end, we will check  his BMET, as he has problems with abdominal cramping if he diuresis too  hard and may be a little bit potassium deficient.   He certainly has other reasons for dyspnea including obesity,  deconditioning, as well as his conduction system disease.  With his  hypertrophic  heart disease, atrial priming has a particularly important  benefit and he is likely losing most or all of that with a very  prolonged PR interval.  He has right bundle-branch block, right axis  deviation, and it may be that pacing with a short AV delay would be  better for him than intrinsic conduction, especially in the setting of  his conduction system disease.  We have talked about that possibility  before and we addressed it again.  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   We had thought that we might proceed with cardiopulmonary stress  testing, trying to clarify the relative contribution to his exercise  performance of his heart.  There will certainly be components of obesity  and deconditioning, but if we could implicate a significant cardiac  component, pacing for the above reasons may be of benefit.   We will check a BNP today to assess his heart failure and to give [name]  some baseline information anticipated  and we will push his diuresis.  We  will see him again in about 3-4 weeks following his CPX.     [name], MD, [facility]  Electronically Signed    SCK/MedQ  DD: 05/12/2008  DT: 05/12/2008  Job #: [id number]

## 2010-08-23 NOTE — Letter (Signed)
May 31, 2009     RE:  Frank, [name]  MRN:  [id number]  /  DOB:  [birth year]   To Whom It May Concern:   [name] has congestive heart failure.  Sodium intake is an essential  part of the treatment to prevent or minimize symptoms.  Very low-sodium  diets in the range of 2 grams or so have been shown to be associated  with improved symptoms.   In addition, high-dose diuretics are required to deal with sodium  exccess and volume management.  Diuretics are also associated with  potassium and magnesium loss which requires further supplementation.   If I can be of any further help in clarifying his condition, please do  not hesitate to contact me.    Sincerely,      [name], MD, [facility]  Electronically Signed    SCK/MedQ  DD: 05/31/2009  DT: 05/31/2009  Job #: [id number]   CC:    Barnett Abu

## 2010-08-23 NOTE — Assessment & Plan Note (Signed)
Frank OFFICE NOTE   [name], TO                        MRN:          [id number]  DATE:10/10/2006                            DOB:          October 18, 1945    Frank [name] comes in today.  He has hypertrophic cardiomyopathy, status  post mitral valve replacement.  Over the last six weeks, he has had  problems with progressive shortness of breath and edema.  We saw [name] a  week or two ago and increased his diuretic, which has been associated  with significant diuresis, although his weight has only changed about 7  or 8 pounds.  He feels a little bit better, but he is still quite  dyspneic with any modest exertion.   His medications include potassium, Lasix, __________,  magnesium, which  initially may have been associated with some diarrhea; Diovan/HCT at  80/12.5, and Coumadin, as well as aspirin at 325.   EXAMINATION:  On examination today, his blood pressure was elevated  again at 152/85, his pulse was 85.  His lungs were clear.  His heart  sounds were regular and extremities had 1+ edema.   Intercurrent echo demonstrated normal left ventricular function with  moderate hypertrophy.  Dimensions were 17 mm on the front and back wall.   IMPRESSION:  1. Hypertrophic cardiomyopathy.  2. Status post mitral valve replacement.  3. 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  4. Status post aortic valve repair at the time of his mitral valve      replacement.  5. Progressive dyspnea, question mechanism.  6. Morbid obesity.  7. Obstructive sleep apnea.   There are a variety of issues.  As his diuresis is successful, I am  surprised that he is not feeling better.  The first issue is whether  there are electrolytic imbalances that are contributing to this.  The  other is whether his dyspnea is unrelated to the fluid overload or  primarily, or whether it may represent ischemia.  To that end, we  will  undertake a Myoview scan.  We will need to do this at the hospital  because of his weight.   Further, he has significant symptoms of obstructive snoring, as well as  hypertension, so we will plan an outpatient sleep study.   He is to see [name] again in two weeks' time to assess how he is doing.     [name], MD, [facility]  Electronically Signed    SCK/MedQ  DD: 10/10/2006  DT: 10/10/2006  Job #: [id number]   cc:   Frank [name], M.D.

## 2010-08-23 NOTE — Assessment & Plan Note (Signed)
[place]                             PULMONARY OFFICE NOTE   HANZEL, MAGRINI                        MRN:          [id number]  DATE:11/14/2006                            DOB:          14-Jan-1946    HISTORY OF PRESENT ILLNESS:  Frank [name] is referred for an evaluation  of dyspnea and abnormal an CT scan.  He reports worsening dyspnea that  has been ongoing for the past 3 months.  He does have a history of  hypertrophic cardiomyopathy with valvular heart disease and initially  his diuretics were increased.  When the dyspnea persisted, a cardiac  evaluation was performed and echocardiogram showed normal LV function  with moderate hypertrophy.  Dimensions were 17 mm in the front and back  wall.  A venous Duplex did not show any evidence of DVTs.  CT angiogram  did not show any evidence of pulmonary emboli, but showed patchy  bilateral ground glass infiltrates.  He is referred to [name] for this  reason and also for evaluation of obstructive sleep apnea.   He reports that his Epworth Sleepiness Score is 3/24 which means that he  is not a very sleepy person.  Bedtime is around 11 p.m.  He generally  sleeps on his right side, wakes up for a bathroom visit and denies any  postvoid sleep latency.  He gets out of bed around 5:30 a.m. and it  takes him about a half hour to get going.  On the weekends, he will  sometimes stay in bed up to 6 a.m.  He denies morning headaches.  There  is no history of paralysis or parasomnias.  His wife has reported loud  snoring and witnessed pauses, but only for a few seconds in his sleep.  He has never been woken up by his own snoring or by choking or gasping  movements.  He drinks about 1-3 cups of coffee in a day and reports  falling asleep while reading and denies any problems driving.   PAST MEDICAL HISTORY:  1. Hypertension.  2. Hypertrophic cardiomyopathy, status post mitral valve replacement      (mechanical  valve).  3. 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  4. Melanoma.  5. He describes an extended illness with heart failure in 1999, that      caused him to be ventilator-dependent for a few days.   PAST SURGICAL HISTORY:  Valve replacement in April 1999.   ALLERGIES:  No known drug allergies.   CURRENT MEDICATIONS:  1. Klor-Con 1 tablet in a.m., 2 tablets p.m.  2. Torsemide 20 mg 2 tablets.  3. Furosemide 40 mg 2 tablets daily.  4. Diovan HCT 80/12.5 mg daily.  5. Coumadin 10 mg daily.  6. Fish oil 1000 mg four tablets daily.  7. Vitamin C 8000 mg 2 tablets daily.  8. Multivitamin daily.  9. Aspirin 81 mg daily.  10.Magnesium oxide 400 mg daily.   SOCIAL HISTORY:  Smoked about half pack per day for about 10 years,  starting as a teenager and quit in 1978.  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   FAMILY HISTORY:  Heart disease in his father, brother and sister.  Father had colon cancer and maternal grandmother had breast cancer.   REVIEW OF SYSTEMS:  Reports a nonproductive cough, occasionally  irregular heart beat and pedal edema.   PHYSICAL EXAMINATION:  VITAL SIGNS:  Weight 354 pounds, temperature  98.1, blood pressure 120/80, oxygen saturations 98% on room air.  HEENT:  Narrow pharyngeal space.  NECK:  Neck circumference about 19 inches.  No lymphadenopathy.  CARDIAC:  S1 normal.  Mechanical S2.  No S3 or rub.  CHEST:  Clear to auscultation.  ABDOMEN:  Soft.  EXTREMITIES:  1+ edema.  NEUROLOGIC:  Nonfocal.   LABORATORY DATA AND X-RAY FINDINGS:  BUN and creatinine 15/0.96, calcium  8.8, potassium 3.7.   IMPRESSION:  1. 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  2. Mild hypoxia at rest.  3. Hypertrophic  cardiomyopathy, status post mitral valve replacement      and aortic valve repair.  4. Morbid obesity.  Even though he is not sleepy, his morbid obesity      with witnessed pauses during sleep and pharyngeal exam are very      suggestive of obstructive sleep apnea.   RECOMMENDATIONS:  1. I will review the CT films from [facility] to see if      the interstitial infiltrate represents pulmonary edema or more      suggestive interstitial lung disease. I do note the normal BNP      level.  2. Full PFTs will be obtained.  We will specifically look at lung      volumes and diffusion capacity to differentiate extraparenchymal      restriction from obesity from interstitial lung disease.  3. An arterial blood gas will be obtained on room air to look for      hypercapnia as a result of obesity hypoventilation syndrome and      degree of hypoxia.  4. He is very interested in starting an exercise program and I have      recommended to start cardiopulmonary rehabilitation at [facility] with graduated exercise program with monitoring of his      saturations and for arrhythmias during the initial few weeks.      Hopefully, this can also be combined with a weight loss program.  5. I think obstructive sleep apnea is very likely and obstructive      sleep apnea and cardiovascular consequences were discussed with Mr.      Pelcher in detail and he evidenced understanding.  A polysomnogram      will be scheduled in the future once the issues have been sorted      out. He would clearly prefer a home study.     Rigoberto Noel, MD  Electronically Signed    RVA/MedQ  DD: 11/14/2006  DT: 11/15/2006  Job #: [id number]   cc:   [name], MD, [facility]

## 2010-08-26 NOTE — Op Note (Signed)
NAMEDERCK, POESCHL               ACCOUNT NO.:  [id number]   MEDICAL RECORD NO.:  [id number]          PATIENT TYPE:  AMB   LOCATION:  ENDO                         [facility]ACILITY[facility]   PHYSICIAN:  Lear Ng, MDDATE OF BIRTH:  10-Oct-1945   DATE OF PROCEDURE:  10/19/2005  DATE OF DISCHARGE:                                 OPERATIVE REPORT   PROCEDURE:  Colonoscopy.   INDICATIONS FOR PROCEDURE:  History of polyps.   MEDICATIONS:  Ampicillin 2 g IV, gentamycin 80 mg IV, propofol infusion per  anesthesia.   FINDINGS:  Rectal exam was normal.  An adult adjustable colonoscope was  inserted into a fair-prepped colon and advanced to the cecum where the  ileocecal valve and appendiceal orifice were identified.  The terminal ileum  was intubated and was normal in appearance.  Careful withdrawal of the  colonoscope revealed a 6-mm semi-sessile polyp in the transverse colon that  could not be snared but it was hot biopsied and removed with a small amount  of oozing of blood at the site.  This bleeding spontaneously resolved.  On  further withdrawal, a 2-mm sessile polyp was noted in the sigmoid colon that  was removed with hot biopsy.  There were scattered large and small sigmoid  diverticulosis.  Retroflexion showed small internal hemorrhoids.   ASSESSMENT:  1.  Colon polyps x2, status post hot biopsy removal x2.  2.  Scattered sigmoid diverticulosis.  3.  Internal hemorrhoids.   PLAN:  1.  Follow up on pathology.  2.  Repeat colonoscopy in three to five years pending on pathology.  3.  Hold Coumadin for another five days, with Lovenox as a bridge.  4.  No aspirin products for 10 days.      Lear Ng, MD  Electronically Signed     VCS/MEDQ  D:  10/19/2005  T:  10/19/2005  Job:  [id number]   cc:   Fransico Him, M.D.  Fax: [phone]

## 2010-09-05 LAB — POCT INR: INR: 3.5

## 2010-09-06 ENCOUNTER — Ambulatory Visit (INDEPENDENT_AMBULATORY_CARE_PROVIDER_SITE_OTHER): Payer: Managed Care, Other (non HMO) | Admitting: Cardiology

## 2010-09-06 DIAGNOSIS — I4891 Unspecified atrial fibrillation: Secondary | ICD-10-CM

## 2010-09-12 LAB — POCT INR: INR: 2.4

## 2010-09-13 ENCOUNTER — Ambulatory Visit (INDEPENDENT_AMBULATORY_CARE_PROVIDER_SITE_OTHER): Payer: Self-pay | Admitting: Internal Medicine

## 2010-09-13 DIAGNOSIS — R0989 Other specified symptoms and signs involving the circulatory and respiratory systems: Secondary | ICD-10-CM

## 2010-09-14 ENCOUNTER — Encounter: Payer: Self-pay | Admitting: Cardiology

## 2010-09-19 LAB — POCT INR: INR: 2.9

## 2010-09-20 ENCOUNTER — Ambulatory Visit: Payer: Self-pay | Admitting: Cardiology

## 2010-09-25 LAB — POCT INR: INR: 2.6

## 2010-09-26 ENCOUNTER — Ambulatory Visit (INDEPENDENT_AMBULATORY_CARE_PROVIDER_SITE_OTHER): Payer: Self-pay | Admitting: Internal Medicine

## 2010-09-26 DIAGNOSIS — R0989 Other specified symptoms and signs involving the circulatory and respiratory systems: Secondary | ICD-10-CM

## 2010-10-05 NOTE — Letter (Signed)
June 27, 2010   Starlyn Skeans Director of [place]:  [name], [name] MRN:  [id number]  /  DOB:  01-08-1946  Dear Ms. Thomas-Williams:  This letter is written on behalf of Frank [name].  As we spoke about a few weeks ago, I am the cardiologist who has been and involved in his care for the last number of years.  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  He also underwent mitral valve and aortic valve surgery.  He has a pacemaker implanted.  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  The management strategies for this apart from what he has already undergone are primarily related to the use of diuretics.  The need for diuretics is variable patient to patient and [name]'s situation.  He has required high-dose multiple diuretics a day to control his fluid balance and to allow him to be functional.  The issues currently relate to the ability to take his diuretics multiple times a day as he tries to conduct his work.  This interferes with his ability to drive.  It has been a preclusive issue for his air travel for the most part.  It also encumbers his ability to have meetings, as when he takes his diuretics he has to interrupt his meetings and his session multiple times a day to use the bathroom.  The treatment strategies for his heart failure with preserved ejection fraction do not really offer [name] much other alternatives.  Hence, this letter has been written in support of his concerns related to his ability to continue to function in his current capacity given his medical condition and his need for chronic and frequent daily doses of diuretics.  If I can be of any further assistance in this issue, please do not hesitate to contact me.  My number is [phone].   Sincerely,     [name], MD, [facility]   SCK/MedQ  DD: 06/26/2010  DT:  06/26/2010  Job #: [id number]

## 2010-10-07 ENCOUNTER — Encounter: Payer: Self-pay | Admitting: Internal Medicine

## 2010-10-10 ENCOUNTER — Ambulatory Visit (INDEPENDENT_AMBULATORY_CARE_PROVIDER_SITE_OTHER): Payer: Managed Care, Other (non HMO) | Admitting: Internal Medicine

## 2010-10-10 ENCOUNTER — Encounter: Payer: Self-pay | Admitting: Internal Medicine

## 2010-10-10 DIAGNOSIS — I498 Other specified cardiac arrhythmias: Secondary | ICD-10-CM

## 2010-10-10 DIAGNOSIS — I421 Obstructive hypertrophic cardiomyopathy: Secondary | ICD-10-CM

## 2010-10-10 DIAGNOSIS — I509 Heart failure, unspecified: Secondary | ICD-10-CM

## 2010-10-10 DIAGNOSIS — I5032 Chronic diastolic (congestive) heart failure: Secondary | ICD-10-CM

## 2010-10-10 DIAGNOSIS — I10 Essential (primary) hypertension: Secondary | ICD-10-CM

## 2010-10-10 DIAGNOSIS — I4891 Unspecified atrial fibrillation: Secondary | ICD-10-CM

## 2010-10-10 DIAGNOSIS — R0602 Shortness of breath: Secondary | ICD-10-CM

## 2010-10-10 DIAGNOSIS — Z952 Presence of prosthetic heart valve: Secondary | ICD-10-CM | POA: Insufficient documentation

## 2010-10-10 DIAGNOSIS — I440 Atrioventricular block, first degree: Secondary | ICD-10-CM

## 2010-10-10 LAB — PACEMAKER DEVICE OBSERVATION
AL AMPLITUDE: 3.4 mv
AL IMPEDENCE PM: 450 Ohm
BAMS-0001: 150 {beats}/min
BATTERY VOLTAGE: 2.9328 V
BRDY-0002RV: 60 {beats}/min
BRDY-0003RV: 125 {beats}/min
RV LEAD AMPLITUDE: 8.3 mv
VENTRICULAR PACING PM: 88

## 2010-10-10 NOTE — Assessment & Plan Note (Signed)
He continues to have paroxysms of atrial fibrillation comprising 22% of his overall heartbeats. Heart rate control during atrial fibrillation is good.

## 2010-10-10 NOTE — Patient Instructions (Signed)
Your physician wants you to follow-up in: 6 months  You will receive a reminder letter in the mail two months in advance. If you don't receive a letter, please call our office to schedule the follow-up appointment.  Your physician recommends that you continue on your current medications as directed. Please refer to the Current Medication list given to you today.  

## 2010-10-10 NOTE — Assessment & Plan Note (Signed)
Stable on current medication.  

## 2010-10-10 NOTE — Assessment & Plan Note (Signed)
Much improved

## 2010-10-10 NOTE — Assessment & Plan Note (Signed)
As noted above much

## 2010-10-10 NOTE — Assessment & Plan Note (Signed)
As above.

## 2010-10-10 NOTE — Progress Notes (Signed)
  HPI  Frank [name] is a 65 y.o. male Seen after a hiatus of months during which he was seen at [facility] cardiology because of a varety of frustrations  He has given his life back over to God and he has been doing much better he decreased his diuretics on his own. He is able to walk. He is traveling by airplane. Cramps in his legs are much improved. His bicarbonate is back to normal. His weight is down  Past Medical History  Diagnosis Date  . Hypertrophic cardiomyopathy   . Diastolic CHF, chronic   . Atrial arrhythmia   . AV block, 1st degree   . Morbid obesity   . History of melanoma   . HTN (hypertension)     Past Surgical History  Procedure Date  . Mitral valve replacement   . Aortic valve replacement   . Pacemaker insertion     [address]   . Inferior oblique myectomy     Current Outpatient Prescriptions  Medication Sig Dispense Refill  . allopurinol (ZYLOPRIM) 300 MG tablet Take 300 mg by mouth daily.        . Ascorbic Acid (VITAMIN C) 1000 MG tablet Take 2,000 mg by mouth daily.        . fish oil-omega-3 fatty acids 1000 MG capsule Take by mouth. 4 capsules daily      . furosemide (LASIX) 40 MG tablet Take 40 mg by mouth daily.       . magnesium oxide (MAG-OX) 400 MG tablet Take 400 mg by mouth 2 (two) times daily.       . Multiple Vitamin (MULTIVITAMIN) tablet Take 1 tablet by mouth daily.        . potassium chloride (KLOR-CON) 10 MEQ CR tablet 7 TABS DAILY       . torsemide (DEMADEX) 20 MG tablet Take 20 mg by mouth daily.        . valsartan-hydrochlorothiazide (DIOVAN-HCT) 80-12.5 MG per tablet Take 1 tablet by mouth daily.        Marland Kitchen warfarin (COUMADIN) 10 MG tablet Take by mouth as directed.        Marland Kitchen DISCONTD: aspirin 81 MG tablet Take 81 mg by mouth daily.        Marland Kitchen DISCONTD: furosemide (LASIX) 40 MG tablet 4 TABS DAILY  360 tablet  4  . DISCONTD: potassium chloride (KLOR-CON 10) 10 MEQ CR tablet 20 TABS DAILY  1800 tablet  3  . DISCONTD: torsemide (DEMADEX) 20 MG  tablet 4 TABS DAILY  360 tablet  4  . DISCONTD: vitamin B-12 (CYANOCOBALAMIN) 1000 MCG tablet Take 1,000 mcg by mouth daily.          No Known Allergies  Review of Systems negative except from HPI and PMH  Physical Exam Well developed and well nourished morbidly obese  in no acute distress HENT normal E scleral and icterus clear Neck supple JVP s not detected  Clear to ausculation Regular rate and rhythm, no murmurs gallops or rub Soft with active bowel sounds No clubbing cyanosis and edema Alert and oriented, grossly normal motor and sensory function Skin Warm and Dry   Assessment and  Plan

## 2010-10-10 NOTE — Assessment & Plan Note (Signed)
The patient is doing better than he has in a long time. He has modified his sodium intake extensively. This has allowed him to take fewer diuretics. As such his electrolyte  balance is much improved and he feels great

## 2010-10-17 ENCOUNTER — Ambulatory Visit (INDEPENDENT_AMBULATORY_CARE_PROVIDER_SITE_OTHER): Payer: Self-pay | Admitting: Internal Medicine

## 2010-10-17 DIAGNOSIS — R0989 Other specified symptoms and signs involving the circulatory and respiratory systems: Secondary | ICD-10-CM

## 2010-10-20 ENCOUNTER — Encounter: Payer: Self-pay | Admitting: Internal Medicine

## 2010-10-20 LAB — PROTIME-INR

## 2010-10-30 LAB — POCT INR: INR: 3.3

## 2010-10-31 ENCOUNTER — Ambulatory Visit (INDEPENDENT_AMBULATORY_CARE_PROVIDER_SITE_OTHER): Payer: Self-pay | Admitting: Internal Medicine

## 2010-10-31 DIAGNOSIS — R0989 Other specified symptoms and signs involving the circulatory and respiratory systems: Secondary | ICD-10-CM

## 2010-11-20 LAB — POCT INR: INR: 2.8

## 2010-11-21 ENCOUNTER — Ambulatory Visit (INDEPENDENT_AMBULATORY_CARE_PROVIDER_SITE_OTHER): Payer: Self-pay | Admitting: Cardiology

## 2010-11-21 DIAGNOSIS — R0989 Other specified symptoms and signs involving the circulatory and respiratory systems: Secondary | ICD-10-CM

## 2010-11-25 ENCOUNTER — Emergency Department (HOSPITAL_COMMUNITY): Payer: Managed Care, Other (non HMO)

## 2010-11-25 ENCOUNTER — Emergency Department (HOSPITAL_COMMUNITY)
Admission: EM | Admit: 2010-11-25 | Discharge: 2010-11-25 | Disposition: A | Discharge status: Home / Self Care | Payer: Managed Care, Other (non HMO) | Attending: Emergency Medicine | Admitting: Emergency Medicine

## 2010-11-25 DIAGNOSIS — I509 Heart failure, unspecified: Secondary | ICD-10-CM | POA: Insufficient documentation

## 2010-11-25 DIAGNOSIS — Z7901 Long term (current) use of anticoagulants: Secondary | ICD-10-CM | POA: Insufficient documentation

## 2010-11-25 DIAGNOSIS — Z950 Presence of cardiac pacemaker: Secondary | ICD-10-CM | POA: Insufficient documentation

## 2010-11-25 DIAGNOSIS — I10 Essential (primary) hypertension: Secondary | ICD-10-CM | POA: Insufficient documentation

## 2010-11-25 DIAGNOSIS — M79609 Pain in unspecified limb: Secondary | ICD-10-CM | POA: Insufficient documentation

## 2010-11-25 DIAGNOSIS — S9030XA Contusion of unspecified foot, initial encounter: Secondary | ICD-10-CM | POA: Insufficient documentation

## 2010-11-25 DIAGNOSIS — W208XXA Other cause of strike by thrown, projected or falling object, initial encounter: Secondary | ICD-10-CM | POA: Insufficient documentation

## 2010-11-25 DIAGNOSIS — M7989 Other specified soft tissue disorders: Secondary | ICD-10-CM | POA: Insufficient documentation

## 2010-11-25 DIAGNOSIS — Z79899 Other long term (current) drug therapy: Secondary | ICD-10-CM | POA: Insufficient documentation

## 2010-12-04 LAB — POCT INR: INR: 3.4

## 2010-12-05 ENCOUNTER — Ambulatory Visit (INDEPENDENT_AMBULATORY_CARE_PROVIDER_SITE_OTHER): Payer: Self-pay | Admitting: Cardiology

## 2010-12-05 DIAGNOSIS — R0989 Other specified symptoms and signs involving the circulatory and respiratory systems: Secondary | ICD-10-CM

## 2010-12-26 LAB — POCT INR: INR: 3.1

## 2010-12-27 ENCOUNTER — Ambulatory Visit (INDEPENDENT_AMBULATORY_CARE_PROVIDER_SITE_OTHER): Payer: Self-pay | Admitting: Cardiology

## 2010-12-27 DIAGNOSIS — R0989 Other specified symptoms and signs involving the circulatory and respiratory systems: Secondary | ICD-10-CM

## 2011-01-15 LAB — POCT INR: INR: 3.3

## 2011-01-16 ENCOUNTER — Ambulatory Visit (INDEPENDENT_AMBULATORY_CARE_PROVIDER_SITE_OTHER): Payer: Self-pay | Admitting: Cardiovascular Disease

## 2011-01-16 DIAGNOSIS — R0989 Other specified symptoms and signs involving the circulatory and respiratory systems: Secondary | ICD-10-CM

## 2011-01-23 LAB — BLOOD GAS, ARTERIAL
Acid-Base Excess: 3.9 — ABNORMAL HIGH
Bicarbonate: 27.7 — ABNORMAL HIGH
FIO2: 0.21
O2 Saturation: 95.1
Patient temperature: 98.6
pO2, Arterial: 72.3 — ABNORMAL LOW

## 2011-02-07 ENCOUNTER — Ambulatory Visit (INDEPENDENT_AMBULATORY_CARE_PROVIDER_SITE_OTHER): Payer: Self-pay | Admitting: Cardiology

## 2011-02-07 DIAGNOSIS — R0989 Other specified symptoms and signs involving the circulatory and respiratory systems: Secondary | ICD-10-CM

## 2011-02-07 DIAGNOSIS — Z952 Presence of prosthetic heart valve: Secondary | ICD-10-CM

## 2011-02-07 DIAGNOSIS — G459 Transient cerebral ischemic attack, unspecified: Secondary | ICD-10-CM

## 2011-02-07 DIAGNOSIS — I4891 Unspecified atrial fibrillation: Secondary | ICD-10-CM

## 2011-02-07 DIAGNOSIS — I38 Endocarditis, valve unspecified: Secondary | ICD-10-CM

## 2011-02-26 LAB — POCT INR: INR: 2.9

## 2011-02-27 ENCOUNTER — Ambulatory Visit (INDEPENDENT_AMBULATORY_CARE_PROVIDER_SITE_OTHER): Payer: Self-pay | Admitting: Cardiology

## 2011-02-27 DIAGNOSIS — G459 Transient cerebral ischemic attack, unspecified: Secondary | ICD-10-CM

## 2011-02-27 DIAGNOSIS — R0989 Other specified symptoms and signs involving the circulatory and respiratory systems: Secondary | ICD-10-CM

## 2011-02-27 DIAGNOSIS — Z952 Presence of prosthetic heart valve: Secondary | ICD-10-CM

## 2011-02-27 DIAGNOSIS — I4891 Unspecified atrial fibrillation: Secondary | ICD-10-CM

## 2011-02-27 DIAGNOSIS — I38 Endocarditis, valve unspecified: Secondary | ICD-10-CM

## 2011-03-19 LAB — POCT INR: INR: 3.4

## 2011-03-20 ENCOUNTER — Ambulatory Visit (INDEPENDENT_AMBULATORY_CARE_PROVIDER_SITE_OTHER): Payer: Self-pay | Admitting: Internal Medicine

## 2011-03-20 DIAGNOSIS — Z952 Presence of prosthetic heart valve: Secondary | ICD-10-CM

## 2011-03-20 DIAGNOSIS — R0989 Other specified symptoms and signs involving the circulatory and respiratory systems: Secondary | ICD-10-CM

## 2011-03-20 DIAGNOSIS — I4891 Unspecified atrial fibrillation: Secondary | ICD-10-CM

## 2011-03-20 DIAGNOSIS — I38 Endocarditis, valve unspecified: Secondary | ICD-10-CM

## 2011-03-20 DIAGNOSIS — G459 Transient cerebral ischemic attack, unspecified: Secondary | ICD-10-CM

## 2011-04-17 ENCOUNTER — Ambulatory Visit (INDEPENDENT_AMBULATORY_CARE_PROVIDER_SITE_OTHER): Payer: Self-pay | Admitting: Cardiology

## 2011-04-17 DIAGNOSIS — R0989 Other specified symptoms and signs involving the circulatory and respiratory systems: Secondary | ICD-10-CM

## 2011-04-17 DIAGNOSIS — I4891 Unspecified atrial fibrillation: Secondary | ICD-10-CM

## 2011-04-17 DIAGNOSIS — Z952 Presence of prosthetic heart valve: Secondary | ICD-10-CM

## 2011-04-17 DIAGNOSIS — I38 Endocarditis, valve unspecified: Secondary | ICD-10-CM

## 2011-04-17 DIAGNOSIS — G459 Transient cerebral ischemic attack, unspecified: Secondary | ICD-10-CM

## 2011-05-04 LAB — POCT INR: INR: 3.4

## 2011-05-05 ENCOUNTER — Ambulatory Visit (INDEPENDENT_AMBULATORY_CARE_PROVIDER_SITE_OTHER): Payer: Self-pay | Admitting: Cardiology

## 2011-05-05 DIAGNOSIS — I4891 Unspecified atrial fibrillation: Secondary | ICD-10-CM

## 2011-05-05 DIAGNOSIS — Z952 Presence of prosthetic heart valve: Secondary | ICD-10-CM

## 2011-05-05 DIAGNOSIS — R0989 Other specified symptoms and signs involving the circulatory and respiratory systems: Secondary | ICD-10-CM

## 2011-05-05 DIAGNOSIS — G459 Transient cerebral ischemic attack, unspecified: Secondary | ICD-10-CM

## 2011-05-14 LAB — POCT INR: INR: 3.3

## 2011-05-15 ENCOUNTER — Ambulatory Visit (INDEPENDENT_AMBULATORY_CARE_PROVIDER_SITE_OTHER): Payer: Self-pay | Admitting: Cardiology

## 2011-05-15 DIAGNOSIS — R0989 Other specified symptoms and signs involving the circulatory and respiratory systems: Secondary | ICD-10-CM

## 2011-05-15 DIAGNOSIS — G459 Transient cerebral ischemic attack, unspecified: Secondary | ICD-10-CM

## 2011-05-15 DIAGNOSIS — I4891 Unspecified atrial fibrillation: Secondary | ICD-10-CM

## 2011-05-15 DIAGNOSIS — Z952 Presence of prosthetic heart valve: Secondary | ICD-10-CM

## 2011-05-26 ENCOUNTER — Telehealth: Payer: Self-pay | Admitting: Internal Medicine

## 2011-05-26 NOTE — Telephone Encounter (Signed)
05-26-11 pt to call back when he has his schedule, needs pacer ck with device/mt

## 2011-05-31 ENCOUNTER — Encounter: Payer: Self-pay | Admitting: *Deleted

## 2011-06-01 ENCOUNTER — Ambulatory Visit (INDEPENDENT_AMBULATORY_CARE_PROVIDER_SITE_OTHER): Payer: Self-pay | Admitting: Cardiology

## 2011-06-01 DIAGNOSIS — R0989 Other specified symptoms and signs involving the circulatory and respiratory systems: Secondary | ICD-10-CM

## 2011-06-01 DIAGNOSIS — Z952 Presence of prosthetic heart valve: Secondary | ICD-10-CM

## 2011-06-01 DIAGNOSIS — G459 Transient cerebral ischemic attack, unspecified: Secondary | ICD-10-CM

## 2011-06-01 DIAGNOSIS — I4891 Unspecified atrial fibrillation: Secondary | ICD-10-CM

## 2011-06-01 LAB — POCT INR: INR: 3.5

## 2011-06-09 ENCOUNTER — Encounter: Payer: Self-pay | Admitting: *Deleted

## 2011-06-14 ENCOUNTER — Other Ambulatory Visit: Payer: Self-pay | Admitting: Internal Medicine

## 2011-06-22 ENCOUNTER — Ambulatory Visit (INDEPENDENT_AMBULATORY_CARE_PROVIDER_SITE_OTHER): Payer: Managed Care, Other (non HMO) | Admitting: Internal Medicine

## 2011-06-22 ENCOUNTER — Encounter: Payer: Self-pay | Admitting: Internal Medicine

## 2011-06-22 ENCOUNTER — Ambulatory Visit: Payer: Self-pay | Admitting: Cardiology

## 2011-06-22 VITALS — BP 138/60 | HR 61 | Ht 72.0 in | Wt 357.8 lb

## 2011-06-22 DIAGNOSIS — I4891 Unspecified atrial fibrillation: Secondary | ICD-10-CM

## 2011-06-22 DIAGNOSIS — Z950 Presence of cardiac pacemaker: Secondary | ICD-10-CM

## 2011-06-22 DIAGNOSIS — G459 Transient cerebral ischemic attack, unspecified: Secondary | ICD-10-CM

## 2011-06-22 DIAGNOSIS — I440 Atrioventricular block, first degree: Secondary | ICD-10-CM

## 2011-06-22 DIAGNOSIS — T82190A Other mechanical complication of cardiac electrode, initial encounter: Secondary | ICD-10-CM

## 2011-06-22 DIAGNOSIS — Z952 Presence of prosthetic heart valve: Secondary | ICD-10-CM

## 2011-06-22 DIAGNOSIS — I421 Obstructive hypertrophic cardiomyopathy: Secondary | ICD-10-CM

## 2011-06-22 DIAGNOSIS — I472 Ventricular tachycardia, unspecified: Secondary | ICD-10-CM

## 2011-06-22 LAB — PACEMAKER DEVICE OBSERVATION
AL AMPLITUDE: 3.7 mv
AL IMPEDENCE PM: 475 Ohm
BATTERY VOLTAGE: 2.9328 V
RV LEAD AMPLITUDE: 7.4 mv
RV LEAD IMPEDENCE PM: 625 Ohm
VENTRICULAR PACING PM: 95

## 2011-06-22 NOTE — Assessment & Plan Note (Signed)
Currently relatively stated

## 2011-06-22 NOTE — Assessment & Plan Note (Signed)
The patient's device was interrogated.  The information was reviewed. No changes were made in the programming.    

## 2011-06-22 NOTE — Progress Notes (Signed)
  HPI  Frank [name] is a 66 y.o. 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  we have struggled with congestive heart failure.   He saw Dr. Reine Just recommended nocturnal oxygen and a right left heart catheter. The patient deferred on both as he had found that the interval dietary changes from my last visit to do aggressive low-salt diet and a higher diuretic regime had resulted in significant improvemen  The patient denies chest pain, shortness of breath, nocturnal dyspnea, orthopnea or peripheral edema.  There have been no palpitations, lightheadedness or syncope.  .     Echo 2011 showed EF 55% with well functioning MVR. Estimated RVSP was 51.  CPX test done about 2010 demonstrated a VO2 max of 12.2 representing 66.4% predicted. his VO2 adjusted for body weight was 25.9 His slope was 34 and his O2 pulse was 17, 88% of predicted.      Past Medical History  Diagnosis Date  . Hypertrophic cardiomyopathy   . Diastolic CHF, chronic   . Atrial arrhythmia     status post ablation in [place] with complication including damage to the aortic valve  . AV block, 1st degree   . Morbid obesity   . History of melanoma   . HTN (hypertension)     Past Surgical History  Procedure Date  . Mitral valve replacement     mechanical  . Aortic valve replacement     mechanical-following traumatic injury during an ablation procedure  . Pacemaker insertion     [address]   . Septal myectomy     Current Outpatient Prescriptions  Medication Sig Dispense Refill  . allopurinol (ZYLOPRIM) 300 MG tablet Take 300 mg by mouth daily.        . Ascorbic Acid (VITAMIN C) 1000 MG tablet Take 2,000 mg by mouth daily.        . fish oil-omega-3 fatty acids 1000 MG capsule Take by mouth. 4 capsules daily      . furosemide (LASIX) 40 MG tablet Take 40 mg by  mouth daily.       . magnesium oxide (MAG-OX) 400 MG tablet Take 400 mg by mouth 3 (three) times daily.       . Multiple Vitamin (MULTIVITAMIN) tablet Take 1 tablet by mouth daily.        . potassium chloride (KLOR-CON) 10 MEQ CR tablet 7 TABS DAILY       . torsemide (DEMADEX) 20 MG tablet Take 20 mg by mouth daily.        . valsartan-hydrochlorothiazide (DIOVAN-HCT) 80-12.5 MG per tablet TAKE 1 TABLET ONCE DAILY  90 tablet  2  . warfarin (COUMADIN) 10 MG tablet Take by mouth as directed.          No Known Allergies  Review of Systems negative except from HPI and PMH  Physical Exam BP 138/60  Pulse 61  Ht 6' (1.829 m)  Wt 357 lb 12.8 oz (162.297 kg)  BMI 48.53 kg/m2 Well developed and well nourished in no acute distress HENT normal E scleral and icterus clear Neck Supple JVP flat; carotids brisk and full Clear to ausculation Regular rate and rhythm, no murmurs gallops or rub Soft with active bowel sounds No clubbing cyanosis Trace Edema Alert and oriented, grossly normal motor and sensory function Skin Warm and Dry   Assessment and  Plan

## 2011-06-22 NOTE — Assessment & Plan Note (Signed)
No intercurrent VT 

## 2011-06-22 NOTE — Assessment & Plan Note (Signed)
It is not clear what the mechanism of this is. Exercises and manipulation at the site failed to reproduce. We will have him transferred a monthly basis and will see him again in 3 months. Given the importance of this lesion, if this continues to be a problem and we can't identify external cause, we will need to consider lead  replacement

## 2011-06-22 NOTE — Assessment & Plan Note (Signed)
Number recurrent atrial arrhythmias; is on warfarin. At the next visit we will need to discuss alternative oral anticoagulation

## 2011-06-22 NOTE — Assessment & Plan Note (Addendum)
Profound first degree block with AR intervals of about 600 ms

## 2011-06-22 NOTE — Patient Instructions (Signed)
Your physician recommends that you schedule a follow-up appointment in: 3 months with Dr. [name].  Your physician recommends that you continue on your current medications as directed. Please refer to the Current Medication list given to you today.

## 2011-07-01 ENCOUNTER — Other Ambulatory Visit: Payer: Self-pay | Admitting: Internal Medicine

## 2011-07-03 ENCOUNTER — Other Ambulatory Visit: Payer: Self-pay

## 2011-07-03 ENCOUNTER — Other Ambulatory Visit: Payer: Self-pay | Admitting: *Deleted

## 2011-07-03 NOTE — Telephone Encounter (Signed)
Francee Gentile, this was sent to me, but what's the refill for?

## 2011-07-10 ENCOUNTER — Other Ambulatory Visit: Payer: Self-pay | Admitting: Pharmacist

## 2011-07-10 ENCOUNTER — Encounter: Payer: Self-pay | Admitting: *Deleted

## 2011-07-10 LAB — POCT INR: INR: 2.7

## 2011-07-10 MED ORDER — WARFARIN SODIUM 10 MG PO TABS: ORAL_TABLET | ORAL | Status: DC

## 2011-07-10 NOTE — Telephone Encounter (Signed)
This encounter was created in error - please disregard.

## 2011-07-11 ENCOUNTER — Ambulatory Visit (INDEPENDENT_AMBULATORY_CARE_PROVIDER_SITE_OTHER): Payer: Managed Care, Other (non HMO) | Admitting: Cardiology

## 2011-07-11 DIAGNOSIS — Z952 Presence of prosthetic heart valve: Secondary | ICD-10-CM

## 2011-07-11 DIAGNOSIS — G459 Transient cerebral ischemic attack, unspecified: Secondary | ICD-10-CM

## 2011-07-11 DIAGNOSIS — Z954 Presence of other heart-valve replacement: Secondary | ICD-10-CM

## 2011-07-11 DIAGNOSIS — I4891 Unspecified atrial fibrillation: Secondary | ICD-10-CM

## 2011-07-17 ENCOUNTER — Other Ambulatory Visit: Payer: Self-pay | Admitting: *Deleted

## 2011-07-17 MED ORDER — POTASSIUM CHLORIDE ER 10 MEQ PO TBCR: 80.0000 meq | EXTENDED_RELEASE_TABLET | Freq: Every day | ORAL | Status: DC

## 2011-07-23 LAB — POCT INR: INR: 2.7

## 2011-07-26 ENCOUNTER — Ambulatory Visit: Payer: Self-pay | Admitting: Cardiology

## 2011-07-26 DIAGNOSIS — G459 Transient cerebral ischemic attack, unspecified: Secondary | ICD-10-CM

## 2011-07-26 DIAGNOSIS — I4891 Unspecified atrial fibrillation: Secondary | ICD-10-CM

## 2011-07-26 DIAGNOSIS — Z952 Presence of prosthetic heart valve: Secondary | ICD-10-CM

## 2011-08-08 LAB — POCT INR: INR: 3

## 2011-08-09 ENCOUNTER — Ambulatory Visit: Payer: Self-pay | Admitting: Cardiology

## 2011-08-09 DIAGNOSIS — Z952 Presence of prosthetic heart valve: Secondary | ICD-10-CM

## 2011-08-09 DIAGNOSIS — I4891 Unspecified atrial fibrillation: Secondary | ICD-10-CM

## 2011-08-09 DIAGNOSIS — G459 Transient cerebral ischemic attack, unspecified: Secondary | ICD-10-CM

## 2011-08-27 LAB — POCT INR: INR: 3.5

## 2011-08-28 ENCOUNTER — Ambulatory Visit: Payer: Self-pay | Admitting: Internal Medicine

## 2011-08-28 DIAGNOSIS — Z952 Presence of prosthetic heart valve: Secondary | ICD-10-CM

## 2011-08-28 DIAGNOSIS — G459 Transient cerebral ischemic attack, unspecified: Secondary | ICD-10-CM

## 2011-08-28 DIAGNOSIS — I4891 Unspecified atrial fibrillation: Secondary | ICD-10-CM

## 2011-09-05 ENCOUNTER — Encounter: Payer: Self-pay | Admitting: *Deleted

## 2011-09-12 ENCOUNTER — Encounter: Payer: Managed Care, Other (non HMO) | Admitting: Internal Medicine

## 2011-09-23 ENCOUNTER — Other Ambulatory Visit: Payer: Self-pay | Admitting: Internal Medicine

## 2011-09-24 ENCOUNTER — Encounter: Payer: Self-pay | Admitting: Internal Medicine

## 2011-09-24 LAB — POCT INR: INR: 3.1

## 2011-09-25 ENCOUNTER — Ambulatory Visit: Payer: Self-pay | Admitting: Cardiology

## 2011-09-25 DIAGNOSIS — G459 Transient cerebral ischemic attack, unspecified: Secondary | ICD-10-CM

## 2011-09-25 DIAGNOSIS — I4891 Unspecified atrial fibrillation: Secondary | ICD-10-CM

## 2011-09-25 DIAGNOSIS — Z952 Presence of prosthetic heart valve: Secondary | ICD-10-CM

## 2011-10-06 ENCOUNTER — Ambulatory Visit (INDEPENDENT_AMBULATORY_CARE_PROVIDER_SITE_OTHER): Payer: Managed Care, Other (non HMO) | Admitting: Internal Medicine

## 2011-10-06 ENCOUNTER — Encounter: Payer: Self-pay | Admitting: Internal Medicine

## 2011-10-06 VITALS — BP 127/80 | HR 68 | Ht 72.0 in | Wt 372.0 lb

## 2011-10-06 DIAGNOSIS — Z950 Presence of cardiac pacemaker: Secondary | ICD-10-CM

## 2011-10-06 DIAGNOSIS — I4891 Unspecified atrial fibrillation: Secondary | ICD-10-CM

## 2011-10-06 DIAGNOSIS — I472 Ventricular tachycardia, unspecified: Secondary | ICD-10-CM

## 2011-10-06 DIAGNOSIS — I440 Atrioventricular block, first degree: Secondary | ICD-10-CM

## 2011-10-06 DIAGNOSIS — I5032 Chronic diastolic (congestive) heart failure: Secondary | ICD-10-CM

## 2011-10-06 DIAGNOSIS — G4730 Sleep apnea, unspecified: Secondary | ICD-10-CM

## 2011-10-06 LAB — PACEMAKER DEVICE OBSERVATION
AL AMPLITUDE: 4.3 mv
AL IMPEDENCE PM: 462.5 Ohm
BAMS-0001: 150 {beats}/min
BATTERY VOLTAGE: 2.9328 V
BRDY-0003RV: 125 {beats}/min
RV LEAD AMPLITUDE: 6 mv
RV LEAD IMPEDENCE PM: 600 Ohm

## 2011-10-06 NOTE — Patient Instructions (Addendum)
Your physician recommends that you schedule a follow-up appointment in: 3 months with Dr. [name].  Your physician has recommended that you have a home sleep study (Nightwatch). This test records several body functions during sleep, including: brain activity, eye movement, oxygen and carbon dioxide blood levels, heart rate and rhythm, breathing rate and rhythm, the flow of air through your mouth and nose, snoring, body muscle movements, and chest and belly movement.  Your physician recommends that you continue on your current medications as directed. Please refer to the Current Medication list given to you today.

## 2011-10-06 NOTE — Assessment & Plan Note (Signed)
Increasing burden of atrial fibrillation detected on his device is unassociated with symptoms. I am somewhat surprised given his hypertrophic heart disease. For right now will hold off on antiarrhythmic drugs. The event that symptoms develop we will need to assess left atrial dimension

## 2011-10-06 NOTE — Assessment & Plan Note (Signed)
We continue to see episodes of his reversion on the ventricular lead. He has not device dependent we will not surgically address this at the present time

## 2011-10-06 NOTE — Assessment & Plan Note (Signed)
As above.

## 2011-10-06 NOTE — Assessment & Plan Note (Signed)
A few episodes of nonsustained VT are identified

## 2011-10-06 NOTE — Progress Notes (Signed)
HPI  Frank [name] is a 66 y.o. 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 we have struggled with congestive heart failure.  He saw Dr. Reine Just who recommended nocturnal oxygen and a right left heart catheter. The patient deferred on both as he had found that the interval dietary changes from my last visit to do aggressive low-salt diet and a higher diuretic regime had resulted in significant improvement     The patient denies chest pain, shortness of breath, nocturnal dyspnea, orthopnea and mild peripheral edema. There have been no palpitations, lightheadedness or syncope.   He has decided to retire at the end of July. He is very excited and his stress level is much diminished .  Echo 2011 showed EF 55% with well functioning MVR. Estimated RVSP was 51.  CPX test done about 2010 demonstrated a VO2 max of 12.2 representing 66.4% predicted. his VO2 adjusted for body weight was 25.9 His slope was 34 and his O2 pulse was 17, 88% of predicted.    Past Medical History  Diagnosis Date  . Hypertrophic cardiomyopathy   . Diastolic CHF, chronic   . Atrial arrhythmia     status post ablation in [place] with complication including damage to the aortic valve  . AV block, 1st degree   . Morbid obesity   . History of melanoma   . HTN (hypertension)     Past Surgical History  Procedure Date  . Mitral valve replacement     mechanical  . Aortic valve replacement     mechanical-following traumatic injury during an ablation procedure  . Pacemaker insertion     [address]   . Septal myectomy     Current Outpatient Prescriptions  Medication Sig Dispense Refill  . allopurinol (ZYLOPRIM) 300 MG tablet Take 300 mg by mouth daily.        . Ascorbic Acid (VITAMIN C) 1000 MG tablet Take 2,000 mg by mouth daily.        Marland Kitchen COUMADIN 10 MG tablet  TAKE AS DIRECTED PER COUMADIN CLINIC  90 tablet  1  . fish oil-omega-3 fatty acids 1000 MG capsule Take by mouth. 4 capsules daily      . furosemide (LASIX) 40 MG tablet       . magnesium oxide (MAG-OX) 400 MG tablet Take 400 mg by mouth 3 (three) times daily.       . Multiple Vitamin (MULTIVITAMIN) tablet Take 1 tablet by mouth daily.        . potassium chloride (K-DUR) 10 MEQ tablet Take 8 tablets (80 mEq total) by mouth daily.  720 tablet  3  . potassium chloride (K-DUR,KLOR-CON) 10 MEQ tablet       . torsemide (DEMADEX) 20 MG tablet       . valsartan-hydrochlorothiazide (DIOVAN-HCT) 80-12.5 MG per tablet TAKE 1 TABLET ONCE DAILY  90 tablet  2  . warfarin (COUMADIN) 10 MG tablet Take as directed per coumadin clinic  90 tablet  0  . DISCONTD: furosemide (LASIX) 40 MG tablet TAKE 4 TABLETS DAILY  360 tablet  3  . DISCONTD: torsemide (DEMADEX) 20 MG tablet TAKE 4 TABLETS DAILY  360 tablet  3    No Known Allergies  Review of Systems negative except from HPI and PMH  Physical Exam BP 127/80  Pulse 68  Ht 6' (1.829 m)  Wt 372 lb (  168.738 kg)  BMI 50.45 kg/m2 Well developed and nourished morbidly ob oh daily we talked about doing aese in no acute distress HENT normal Neck supple  Clear Regular rate and rhythm, no murmurs or gallops Abd-soft with active BS No Clubbing cyanosis 1+ edema Skin-warm and dry A & Oriented  Grossly normal sensory and motor function  Skin Warm and Dry    Assessment and  Plan

## 2011-10-06 NOTE — Assessment & Plan Note (Signed)
The patient's device was interrogated.  The information was reviewed. No changes were made in the programming.    

## 2011-10-06 NOTE — Assessment & Plan Note (Signed)
Stable on current meds 

## 2011-10-16 ENCOUNTER — Telehealth: Payer: Self-pay | Admitting: Internal Medicine

## 2011-10-16 NOTE — Telephone Encounter (Signed)
I spoke with the patient and recommended claritin and coricidin.

## 2011-10-16 NOTE — Telephone Encounter (Signed)
New msg Pt wants to know about what med he can take for cold congestion since he takes coumadin.

## 2011-10-23 ENCOUNTER — Ambulatory Visit: Payer: Self-pay | Admitting: Internal Medicine

## 2011-10-23 DIAGNOSIS — G459 Transient cerebral ischemic attack, unspecified: Secondary | ICD-10-CM

## 2011-10-23 DIAGNOSIS — I4891 Unspecified atrial fibrillation: Secondary | ICD-10-CM

## 2011-10-23 DIAGNOSIS — Z9889 Other specified postprocedural states: Secondary | ICD-10-CM

## 2011-11-09 LAB — POCT INR: INR: 2.1

## 2011-11-10 ENCOUNTER — Ambulatory Visit: Payer: Self-pay | Admitting: Cardiology

## 2011-11-10 DIAGNOSIS — G459 Transient cerebral ischemic attack, unspecified: Secondary | ICD-10-CM

## 2011-11-10 DIAGNOSIS — I4891 Unspecified atrial fibrillation: Secondary | ICD-10-CM

## 2011-11-10 DIAGNOSIS — Z9889 Other specified postprocedural states: Secondary | ICD-10-CM

## 2011-11-28 ENCOUNTER — Ambulatory Visit: Payer: Self-pay | Admitting: Cardiology

## 2011-11-28 DIAGNOSIS — Z9889 Other specified postprocedural states: Secondary | ICD-10-CM

## 2011-11-28 DIAGNOSIS — G459 Transient cerebral ischemic attack, unspecified: Secondary | ICD-10-CM

## 2011-11-28 DIAGNOSIS — I4891 Unspecified atrial fibrillation: Secondary | ICD-10-CM

## 2011-12-17 LAB — POCT INR: INR: 2.7

## 2011-12-18 ENCOUNTER — Ambulatory Visit: Payer: Self-pay | Admitting: Internal Medicine

## 2011-12-18 DIAGNOSIS — I4891 Unspecified atrial fibrillation: Secondary | ICD-10-CM

## 2011-12-18 DIAGNOSIS — Z9889 Other specified postprocedural states: Secondary | ICD-10-CM

## 2011-12-18 DIAGNOSIS — G459 Transient cerebral ischemic attack, unspecified: Secondary | ICD-10-CM

## 2011-12-29 NOTE — Telephone Encounter (Signed)
This encounter was created in error - please disregard.

## 2012-01-08 ENCOUNTER — Encounter: Payer: Self-pay | Admitting: *Deleted

## 2012-01-08 ENCOUNTER — Encounter: Payer: Managed Care, Other (non HMO) | Admitting: *Deleted

## 2012-01-12 LAB — POCT INR: INR: 2.6

## 2012-01-15 ENCOUNTER — Ambulatory Visit: Payer: Self-pay | Admitting: Cardiology

## 2012-01-15 DIAGNOSIS — G459 Transient cerebral ischemic attack, unspecified: Secondary | ICD-10-CM

## 2012-01-15 DIAGNOSIS — I4891 Unspecified atrial fibrillation: Secondary | ICD-10-CM

## 2012-01-15 DIAGNOSIS — Z9889 Other specified postprocedural states: Secondary | ICD-10-CM

## 2012-01-29 ENCOUNTER — Telehealth: Payer: Self-pay | Admitting: Internal Medicine

## 2012-01-29 NOTE — Telephone Encounter (Signed)
01-29-12 lmm @ 439pm for pt to call to set up with klein, was due in October/mt

## 2012-02-05 ENCOUNTER — Ambulatory Visit: Payer: Self-pay | Admitting: Cardiology

## 2012-02-05 DIAGNOSIS — Z9889 Other specified postprocedural states: Secondary | ICD-10-CM

## 2012-02-05 DIAGNOSIS — I4891 Unspecified atrial fibrillation: Secondary | ICD-10-CM

## 2012-02-05 DIAGNOSIS — G459 Transient cerebral ischemic attack, unspecified: Secondary | ICD-10-CM

## 2012-02-19 ENCOUNTER — Other Ambulatory Visit: Payer: Self-pay | Admitting: Cardiology

## 2012-02-19 MED ORDER — VALSARTAN-HYDROCHLOROTHIAZIDE 80-12.5 MG PO TABS: 1.0000 | ORAL_TABLET | Freq: Every day | ORAL | Status: DC

## 2012-03-04 ENCOUNTER — Ambulatory Visit: Payer: Self-pay | Admitting: Cardiovascular Disease

## 2012-03-04 DIAGNOSIS — I4891 Unspecified atrial fibrillation: Secondary | ICD-10-CM

## 2012-03-04 DIAGNOSIS — Z9889 Other specified postprocedural states: Secondary | ICD-10-CM

## 2012-03-04 DIAGNOSIS — G459 Transient cerebral ischemic attack, unspecified: Secondary | ICD-10-CM

## 2012-03-06 ENCOUNTER — Other Ambulatory Visit: Payer: Self-pay | Admitting: *Deleted

## 2012-03-06 MED ORDER — WARFARIN SODIUM 10 MG PO TABS: ORAL_TABLET | ORAL | Status: DC

## 2012-03-27 LAB — POCT INR: INR: 2.8

## 2012-03-28 ENCOUNTER — Ambulatory Visit: Payer: Self-pay | Admitting: Cardiology

## 2012-03-28 ENCOUNTER — Other Ambulatory Visit: Payer: Self-pay

## 2012-03-28 DIAGNOSIS — I4891 Unspecified atrial fibrillation: Secondary | ICD-10-CM

## 2012-03-28 DIAGNOSIS — Z9889 Other specified postprocedural states: Secondary | ICD-10-CM

## 2012-03-28 DIAGNOSIS — G459 Transient cerebral ischemic attack, unspecified: Secondary | ICD-10-CM

## 2012-03-28 MED ORDER — WARFARIN SODIUM 10 MG PO TABS: ORAL_TABLET | ORAL | Status: DC

## 2012-04-16 ENCOUNTER — Other Ambulatory Visit: Payer: Self-pay

## 2012-04-16 MED ORDER — COUMADIN 10 MG PO TABS: ORAL_TABLET | ORAL | Status: DC

## 2012-04-22 ENCOUNTER — Ambulatory Visit: Payer: Self-pay | Admitting: Cardiovascular Disease

## 2012-04-22 DIAGNOSIS — G459 Transient cerebral ischemic attack, unspecified: Secondary | ICD-10-CM

## 2012-04-22 DIAGNOSIS — Z9889 Other specified postprocedural states: Secondary | ICD-10-CM

## 2012-04-22 DIAGNOSIS — I4891 Unspecified atrial fibrillation: Secondary | ICD-10-CM

## 2012-04-24 ENCOUNTER — Encounter: Payer: Self-pay | Admitting: Internal Medicine

## 2012-04-24 ENCOUNTER — Ambulatory Visit (INDEPENDENT_AMBULATORY_CARE_PROVIDER_SITE_OTHER): Payer: Managed Care, Other (non HMO) | Admitting: Internal Medicine

## 2012-04-24 VITALS — BP 130/80 | HR 62 | Ht 72.0 in | Wt 362.0 lb

## 2012-04-24 DIAGNOSIS — Z950 Presence of cardiac pacemaker: Secondary | ICD-10-CM

## 2012-04-24 DIAGNOSIS — T82190A Other mechanical complication of cardiac electrode, initial encounter: Secondary | ICD-10-CM

## 2012-04-24 DIAGNOSIS — I472 Ventricular tachycardia: Secondary | ICD-10-CM

## 2012-04-24 DIAGNOSIS — I4891 Unspecified atrial fibrillation: Secondary | ICD-10-CM

## 2012-04-24 LAB — PACEMAKER DEVICE OBSERVATION
AL AMPLITUDE: 2.4 mv
ATRIAL PACING PM: 3.8
BATTERY VOLTAGE: 2.9178 V
BRDY-0002RV: 60 {beats}/min
RV LEAD IMPEDENCE PM: 562.5 Ohm
VENTRICULAR PACING PM: 98

## 2012-04-24 MED ORDER — POTASSIUM CHLORIDE CRYS ER 10 MEQ PO TBCR: 80.0000 meq | EXTENDED_RELEASE_TABLET | Freq: Every day | ORAL | Status: DC

## 2012-04-24 NOTE — Assessment & Plan Note (Signed)
No intercurrent ventricular tachycardia 

## 2012-04-24 NOTE — Assessment & Plan Note (Signed)
Ongoing episodes. As she is not dependent on the sleep, we'll hold off on intervening.

## 2012-04-24 NOTE — Patient Instructions (Signed)
Remote monitoring is used to monitor your Pacemaker of ICD from home. This monitoring reduces the number of office visits required to check your device to one time per year. It allows [name] to keep an eye on the functioning of your device to ensure it is working properly. You are scheduled for a device check from home on 07/22/12. You may send your transmission at any time that day. If you have a wireless device, the transmission will be sent automatically. After your physician reviews your transmission, you will receive a postcard with your next transmission date.   Your physician wants you to follow-up in: 6 months with Dr. [name]. You will receive a reminder letter in the mail two months in advance. If you don't receive a letter, please call our office to schedule the follow-up appointment.  Your physician recommends that you continue on your current medications as directed. Please refer to the Current Medication list given to you today.

## 2012-04-24 NOTE — Assessment & Plan Note (Signed)
Frequent episodes of atrial fibrillation as well as atrial tachycardia in the 120 beats per minute range. We have reprogrammed his device in the DDD mode to the DDIR mode to prevent tracking

## 2012-04-24 NOTE — Assessment & Plan Note (Signed)
The patient's device was interrogated.  The information was reviewed.  Device programming from the DDD mode to the DDI mode tracking

## 2012-04-24 NOTE — Progress Notes (Signed)
Patient Care Team: Derinda Late, MD as PCP - General (Family Medicine)   HPI  Frank [name] is a 67 y.o. 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 we have struggled with congestive heart failure.  He saw Dr. Reine Just who recommended nocturnal oxygen and a right left heart catheter. The patient deferred on both as he had found that the interval dietary changes from my last visit to do aggressive low-salt diet and a higher diuretic regime had resulted in significant improvement  The patient denies chest pain, shortness of breath, nocturnal dyspnea, orthopnea he has chronic edema which she says doesn't bother him. There have been no palpitations, lightheadedness or syncope.   She's feeling much much better since he retired.  There has been some issue with the Coumadin clinic about the frequency of his checks. In his at home checking his supposed to do it every 2 weeks he sentences every 3-4 weeks. This has resulted in frustration on the part of the Coumadin clinic staff and I will need to try to understand this more clearly. For right now he is on Coumadin DAW .  Echo 2011 showed EF 55% with well functioning MVR. Estimated RVSP was 51.  CPX test done about 2010 demonstrated a VO2 max of 12.2 representing 66.4% predicted. his VO2 adjusted for body weight was 25.9 His slope was 34 and his O2 pulse was 17, 88% of predicted.    Past Medical History  Diagnosis Date  . Hypertrophic cardiomyopathy     s/p myoectomy  . Diastolic CHF, chronic   . Atrial arrhythmia     status post ablation in [place] with complication including damage to the aortic valve  . AV block, 1st degree   . Morbid obesity   . History of melanoma   . HTN (hypertension)   . Atrial fibrillation   . Bradycardia   . Pacemaker  -SJM   . H/O mitral valve replacement with  mechanical valve   . S/P aortic valve repair     Past Surgical History  Procedure Date  . Mitral valve replacement     mechanical  . Aortic valve replacement     mechanical-following traumatic injury during an ablation procedure  . Pacemaker insertion     [address]   . Septal myectomy     Current Outpatient Prescriptions  Medication Sig Dispense Refill  . allopurinol (ZYLOPRIM) 300 MG tablet Take 300 mg by mouth daily.        . Ascorbic Acid (VITAMIN C) 1000 MG tablet Take 2,000 mg by mouth daily.        Marland Kitchen COUMADIN 10 MG tablet Take as directed per coumadin clinic  90 tablet  1  . fish oil-omega-3 fatty acids 1000 MG capsule Take by mouth. 4 capsules daily      . furosemide (LASIX) 40 MG tablet       . magnesium oxide (MAG-OX) 400 MG tablet Take 400 mg by mouth 3 (three) times daily.       . Multiple Vitamin (MULTIVITAMIN) tablet Take 1 tablet by mouth daily.        . potassium chloride (K-DUR) 10 MEQ tablet Take 8 tablets (80 mEq total) by mouth daily.  720 tablet  3  . potassium chloride (K-DUR,KLOR-CON) 10 MEQ tablet       . torsemide (DEMADEX) 20 MG tablet       .  valsartan-hydrochlorothiazide (DIOVAN-HCT) 80-12.5 MG per tablet Take 1 tablet by mouth daily.  90 tablet  2    No Known Allergies  Review of Systems negative except from HPI and PMH  Physical Exam BP 130/80  Pulse 62  Ht 6' (1.829 m)  Wt 362 lb (164.202 kg)  BMI 49.10 kg/m2  SpO2 94% Well developed and well nourished in no acute distress HENT normal E scleral and icterus clear Neck Supple JVP flat; carotids brisk and full Clear to ausculation  *Regular rate and rhythm, no murmurs gallops or rub Soft with active bowel sounds No clubbing cyanosis 2+ Edema Alert and oriented, grossly normal motor and sensory function Skin Warm and Dry    Assessment and  Plan

## 2012-04-25 ENCOUNTER — Telehealth: Payer: Self-pay | Admitting: Internal Medicine

## 2012-04-25 NOTE — Telephone Encounter (Signed)
LMTCB

## 2012-04-25 NOTE — Telephone Encounter (Signed)
Pt wants Dr. [name] to know that he has 1 refill remaining on his coumadin.

## 2012-04-25 NOTE — Telephone Encounter (Signed)
New problem:   Seen in the office on yesterday was told to call back with information .    Refill on bottles x 1 time. Need Rx in June.

## 2012-04-26 MED ORDER — COUMADIN 10 MG PO TABS: ORAL_TABLET | ORAL | Status: DC

## 2012-04-26 NOTE — Telephone Encounter (Signed)
Will forward to Dr. [name] as Frank [name] and to [facility]. The patient has been made aware that he can only receive #90 w/ 3 refills per protocol.

## 2012-05-20 ENCOUNTER — Ambulatory Visit: Payer: Self-pay | Admitting: Cardiology

## 2012-05-20 DIAGNOSIS — Z9889 Other specified postprocedural states: Secondary | ICD-10-CM

## 2012-05-20 DIAGNOSIS — G459 Transient cerebral ischemic attack, unspecified: Secondary | ICD-10-CM

## 2012-05-20 DIAGNOSIS — I4891 Unspecified atrial fibrillation: Secondary | ICD-10-CM

## 2012-05-27 ENCOUNTER — Other Ambulatory Visit: Payer: Self-pay | Admitting: Internal Medicine

## 2012-06-06 ENCOUNTER — Telehealth: Payer: Self-pay | Admitting: Internal Medicine

## 2012-06-06 DIAGNOSIS — I4891 Unspecified atrial fibrillation: Secondary | ICD-10-CM

## 2012-06-06 NOTE — Telephone Encounter (Signed)
New problem    Pt having problem with prescriptions need written prescriptions to take to pharmacy for Klor-con. Please call pt to get direct information about problem

## 2012-06-06 NOTE — Telephone Encounter (Signed)
Spoke with pt. He would like to send a message to [facility] regarding written prescriptions. He does not want to give information over the phone.  He will look for after visit summary from last office visit for activation code to sign up for my chart. He does not want new activation code letter mailed to him at this time.  If he is unable to sign up for my chart he will bring written information to office for Dr. [name]

## 2012-06-07 ENCOUNTER — Encounter: Payer: Self-pay | Admitting: Internal Medicine

## 2012-06-07 NOTE — Telephone Encounter (Signed)
I clarified with Frank [name] that the patient will bring by a written message for Dr. [name].

## 2012-06-08 LAB — POCT INR: INR: 2.5

## 2012-06-11 ENCOUNTER — Ambulatory Visit: Payer: Self-pay | Admitting: Cardiovascular Disease

## 2012-06-11 DIAGNOSIS — I4891 Unspecified atrial fibrillation: Secondary | ICD-10-CM

## 2012-06-11 DIAGNOSIS — Z9889 Other specified postprocedural states: Secondary | ICD-10-CM

## 2012-06-11 DIAGNOSIS — G459 Transient cerebral ischemic attack, unspecified: Secondary | ICD-10-CM

## 2012-06-11 MED ORDER — POTASSIUM CHLORIDE CRYS ER 10 MEQ PO TBCR: 80.0000 meq | EXTENDED_RELEASE_TABLET | Freq: Every day | ORAL | Status: DC

## 2012-06-11 NOTE — Telephone Encounter (Signed)
I would suspect it is not related to the pacemaker programming. May want to think about adjusting   diuretics

## 2012-06-11 NOTE — Telephone Encounter (Addendum)
My chart message: Sent: 06/07/2012 1:50 PM To: Frank Filbert, RN Subject: Frank [name]: Visit Follow-Up Question ----- Message ----- From: Frank [name] Sent: 06/07/2012 1:43 PM To: Frank [name] Triage Subject: Visit Follow-Up Question Hello again Dr. Caryl [name] -- My ankles seem to be swelling more since my visit. Do you think it could be the adjustment you made to my pacemaker? Other than the swelling I feel fine with no apparent loss of energy. Thanks, Frank [name]  I will forward this message to Dr. Caryl [name] to review as this came through as a patient email. RX for potassium has been printed and is waiting for Dr. Olin Pia signature.

## 2012-06-11 NOTE — Telephone Encounter (Addendum)
My chart message: From: Norberta Keens, LPN   Sent: [id number] 1:50 PM   To: Emily Filbert, RN   Subject: Melton Alar: Visit Follow-Up Question             ----- Message -----   From: Vania Rea   Sent: 06/07/2012 1:41 PM   To: Abran Duke Triage   Subject: Visit Follow-Up Question       Hello Dr [name] --   I need a new prescription for Klor-Con, NDC# [id number], no substitutions, 90 days, 8 per day, three refills.      My pharmacy said to be sure that the [facility] # above is on the prescription or I will get the wrong ones again.      Would like to pick the scrip up from your office no later than Wed Mar 05 if possible so I can mail it in.      Please advise questions.      Thanks, [name]

## 2012-06-13 NOTE — Telephone Encounter (Signed)
Dr. [name]- do you want to give any orders for changes on his diuretics?

## 2012-06-13 NOTE — Telephone Encounter (Signed)
hes listed as taking both furosemide and demadex  plz clarify and then I would double it eavery other day for ten days thanks

## 2012-07-02 NOTE — Telephone Encounter (Signed)
(  Patient e-mail respsonse)- Sorry for the delay in letting you know about your prescription, but this is ready at the front desk for you for pickup. Also to follow up, Dr. [name] had received your previous message about having some swelling in your ankles since your last visit. He did say he did not think this was related to any adjustments in your pacemaker. If you are still having issues with this, he did say he could make some adjustments in your fluid medication for several days. We have listed in your chart that you are on both furosemide (lasix) and torsemide (demadex). Please let us know how your swelling is doing and if you taking just one or both of the above fluid pills. Again, we apologize for the delay in responding to you.   Thank you, Alvis Lemmings, RN ===View-only below this line===   ----- Message -----    From: Frank [name]    Sent: 07/01/2012  7:10 AM EDT      To: [name], MD Subject: RE: Visit Follow-Up Question  Hi -- I never got the below-mentioned call saying that my prescription is ready to pick up.  I then left on a cruise on 3/7 and had another trip immediately upon returning home from the cruise.  My wife is at home and said there was no message on our answering machine.  I will be back in town this Wednesday.  Is my prescription ready for me to pick up?  Thanks!  Frank [name]   cell: [phone]  ----- Message ----- From: Nurse Luvenia Redden Sent: 06/11/2012  4:27 PM EST To: Frank [name] Subject: RE: Visit Follow-Up Question  Mr. [name], Your prescription has been printed and is awaiting Dr. Olin Pia signature. He will be in the office on 3/5 to sign. I will ask that you be called when this is ready. The message regarding swelling in your feet has also been forwarded to Dr. [name] for review.   Thank you,  Alvis Lemmings, RN

## 2012-07-05 ENCOUNTER — Ambulatory Visit (INDEPENDENT_AMBULATORY_CARE_PROVIDER_SITE_OTHER): Payer: BC Managed Care – PPO | Admitting: Cardiovascular Disease

## 2012-07-05 DIAGNOSIS — I4891 Unspecified atrial fibrillation: Secondary | ICD-10-CM

## 2012-07-05 DIAGNOSIS — Z9889 Other specified postprocedural states: Secondary | ICD-10-CM

## 2012-07-05 DIAGNOSIS — G459 Transient cerebral ischemic attack, unspecified: Secondary | ICD-10-CM

## 2012-07-09 NOTE — Telephone Encounter (Signed)
(  patient e-mail response)- ----- Message ----- From: Vania Rea Sent: 07/03/2012 10:18 AM To: Abran Duke Triage Subject: RE: Visit Follow-Up Question Yes, I am taking one of each diuretic every morning. The swelling is not a problem, and seems to have resolved a little. I just thought he would like to know about it. I will try ot come in today to pick up my prescription, thanks.   Will forward to Dr. [name]- just wanted to let you know the patient is taking furosemide and torsemide. Please advise even though the patient seems to be doing well.

## 2012-07-22 ENCOUNTER — Ambulatory Visit (INDEPENDENT_AMBULATORY_CARE_PROVIDER_SITE_OTHER): Payer: BC Managed Care – PPO | Admitting: Cardiovascular Disease

## 2012-07-22 ENCOUNTER — Encounter: Payer: Self-pay | Admitting: *Deleted

## 2012-07-22 DIAGNOSIS — I4891 Unspecified atrial fibrillation: Secondary | ICD-10-CM

## 2012-07-22 DIAGNOSIS — G459 Transient cerebral ischemic attack, unspecified: Secondary | ICD-10-CM

## 2012-07-22 DIAGNOSIS — Z9889 Other specified postprocedural states: Secondary | ICD-10-CM

## 2012-07-24 ENCOUNTER — Encounter: Payer: Self-pay | Admitting: *Deleted

## 2012-08-11 LAB — POCT INR: INR: 2.7

## 2012-08-12 ENCOUNTER — Ambulatory Visit (INDEPENDENT_AMBULATORY_CARE_PROVIDER_SITE_OTHER): Payer: BC Managed Care – PPO | Admitting: Cardiology

## 2012-08-12 DIAGNOSIS — I4891 Unspecified atrial fibrillation: Secondary | ICD-10-CM

## 2012-08-12 DIAGNOSIS — Z9889 Other specified postprocedural states: Secondary | ICD-10-CM

## 2012-08-12 DIAGNOSIS — G459 Transient cerebral ischemic attack, unspecified: Secondary | ICD-10-CM

## 2012-08-22 ENCOUNTER — Encounter: Payer: Self-pay | Admitting: Internal Medicine

## 2012-09-03 LAB — POCT INR: INR: 3

## 2012-09-04 ENCOUNTER — Ambulatory Visit (INDEPENDENT_AMBULATORY_CARE_PROVIDER_SITE_OTHER): Payer: BC Managed Care – PPO | Admitting: Cardiovascular Disease

## 2012-09-04 DIAGNOSIS — I4891 Unspecified atrial fibrillation: Secondary | ICD-10-CM

## 2012-09-04 DIAGNOSIS — G459 Transient cerebral ischemic attack, unspecified: Secondary | ICD-10-CM

## 2012-09-04 DIAGNOSIS — Z9889 Other specified postprocedural states: Secondary | ICD-10-CM

## 2012-09-18 ENCOUNTER — Encounter: Payer: BC Managed Care – PPO | Admitting: Internal Medicine

## 2012-09-26 ENCOUNTER — Ambulatory Visit (INDEPENDENT_AMBULATORY_CARE_PROVIDER_SITE_OTHER): Payer: BC Managed Care – PPO | Admitting: Cardiology

## 2012-09-26 DIAGNOSIS — Z9889 Other specified postprocedural states: Secondary | ICD-10-CM

## 2012-09-26 DIAGNOSIS — G459 Transient cerebral ischemic attack, unspecified: Secondary | ICD-10-CM

## 2012-09-26 DIAGNOSIS — I4891 Unspecified atrial fibrillation: Secondary | ICD-10-CM

## 2012-10-06 ENCOUNTER — Encounter: Payer: Self-pay | Admitting: Internal Medicine

## 2012-10-08 MED ORDER — COUMADIN 10 MG PO TABS: ORAL_TABLET | ORAL | Status: DC

## 2012-10-15 ENCOUNTER — Encounter: Payer: Self-pay | Admitting: *Deleted

## 2012-10-16 ENCOUNTER — Encounter: Payer: Self-pay | Admitting: Internal Medicine

## 2012-10-16 ENCOUNTER — Ambulatory Visit (INDEPENDENT_AMBULATORY_CARE_PROVIDER_SITE_OTHER): Payer: BC Managed Care – PPO | Admitting: Internal Medicine

## 2012-10-16 VITALS — BP 115/62 | HR 59 | Ht 72.0 in | Wt 362.0 lb

## 2012-10-16 DIAGNOSIS — I472 Ventricular tachycardia: Secondary | ICD-10-CM

## 2012-10-16 DIAGNOSIS — G459 Transient cerebral ischemic attack, unspecified: Secondary | ICD-10-CM

## 2012-10-16 DIAGNOSIS — I4891 Unspecified atrial fibrillation: Secondary | ICD-10-CM

## 2012-10-16 DIAGNOSIS — I5032 Chronic diastolic (congestive) heart failure: Secondary | ICD-10-CM

## 2012-10-16 DIAGNOSIS — I10 Essential (primary) hypertension: Secondary | ICD-10-CM

## 2012-10-16 DIAGNOSIS — I440 Atrioventricular block, first degree: Secondary | ICD-10-CM

## 2012-10-16 DIAGNOSIS — I421 Obstructive hypertrophic cardiomyopathy: Secondary | ICD-10-CM

## 2012-10-16 DIAGNOSIS — Z950 Presence of cardiac pacemaker: Secondary | ICD-10-CM

## 2012-10-16 LAB — PACEMAKER DEVICE OBSERVATION
AL AMPLITUDE: 2.5 mv
BATTERY VOLTAGE: 2.9178 V
BRDY-0002RV: 60 {beats}/min
DEVICE MODEL PM: 2272351
RV LEAD AMPLITUDE: 6 mv

## 2012-10-16 LAB — BASIC METABOLIC PANEL
GFR: 97.6 mL/min (ref >60.00)
Potassium: 4 mEq/L (ref 3.5–5.1)
Sodium: 142 mEq/L (ref 135–145)

## 2012-10-16 LAB — MAGNESIUM: Magnesium: 2.1 mg/dL (ref 1.5–2.5)

## 2012-10-16 MED ORDER — COUMADIN 10 MG PO TABS: ORAL_TABLET | ORAL | Status: DC

## 2012-10-16 NOTE — Progress Notes (Signed)
Patient Care Team: Derinda Late, MD as PCP - General (Family Medicine)   HPI  Frank [name] is a 67 y.o. 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 He has profound first degree heart block and is status post pacemaker implantation. We have struggled with HFpEF   He saw Dr. Reine Just who recommended nocturnal oxygen and a right left heart catheter. The patient deferred on both as he had found that the interval dietary changes from my last visit to do aggressive low-salt diet and a higher diuretic regime had resulted in significant improvement   The patient denies chest pain, shortness of breath, nocturnal dyspnea, orthopnea he has chronic edema which he says doesn't bother him. There have been no palpitations, lightheadedness or syncope. 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     Echo 2011 showed EF 55% with well functioning MVR. Estimated RVSP was 51.  CPX test done about 2010 demonstrated a VO2 max of 12.2 representing 66.4% predicted. his VO2 adjusted for body weight was 25.9 His slope was 34 and his O2 pulse was 17, 88% of predicted.    Past Medical History  Diagnosis Date  . Hypertrophic cardiomyopathy     s/p myoectomy  . Diastolic CHF, chronic   . Atrial arrhythmia     status post ablation in [place] with complication including damage to the aortic valve  . AV block, 1st degree   . Morbid obesity   . History of melanoma   . HTN (hypertension)   . Atrial fibrillation   . Bradycardia   . Pacemaker  -SJM   . mitral valve replacement-St. Jude's mechanical   . S/P aortic valve repair     Past Surgical History  Procedure Laterality Date  . Mitral valve replacement      mechanical  . Aortic valve replacement      mechanical-following traumatic injury during an ablation procedure  . Pacemaker  insertion      [address]   . Septal myectomy      Current Outpatient Prescriptions  Medication Sig Dispense Refill  . allopurinol (ZYLOPRIM) 300 MG tablet Take 300 mg by mouth daily.        . Ascorbic Acid (VITAMIN C) 1000 MG tablet Take 2,000 mg by mouth daily.        Marland Kitchen COUMADIN 10 MG tablet Take as directed per coumadin clinic  90 tablet  1  . fish oil-omega-3 fatty acids 1000 MG capsule Take by mouth. 4 capsules daily      . furosemide (LASIX) 40 MG tablet Take 40 mg by mouth daily.       . magnesium oxide (MAG-OX) 400 MG tablet Take 400 mg by mouth 3 (three) times daily.       . Multiple Vitamin (MULTIVITAMIN) tablet Take 1 tablet by mouth daily.        . potassium chloride (KLOR-CON M10) 10 MEQ tablet Take 8 tablets (80 mEq total) by mouth daily.  720 tablet  3  . torsemide (DEMADEX) 20 MG tablet Take 20 mg by mouth daily.       . valsartan-hydrochlorothiazide (DIOVAN-HCT) 80-12.5 MG per tablet Take 1 tablet by mouth daily.  90 tablet  2   No current facility-administered medications for this visit.    No Known Allergies  Review  of Systems negative except from HPI and PMH  Physical Exam BP 115/62  Pulse 59  Ht 6' (1.829 m)  Wt 362 lb (164.202 kg)  BMI 49.09 kg/m2 Well developed and morbidly obese in no acute distress HENT normal E scleral and icterus clear Neck Supple  Device pocket well healed; without hematoma or erythema .dpClear to ausculation  Regular rate and rhythm, mechanical S1  with early systolic murmur Soft with active bowel sounds No clubbing cyanosis 2+ Edema Alert and oriented, grossly normal motor and sensory function Skin Warm and Dry  ECG demonstrates a regular wide-complex rhythm with a right bundle branch block pattern there is right axis deviation. Internal micrograms demonstrated this represents to 1 AV conduction from a slow atrial tachycardia at a rate of 120 beats per minute  Assessment and  Plan

## 2012-10-16 NOTE — Assessment & Plan Note (Signed)
Relatively well compensated volume loss. Will check his metabolic profile and magnesium today.

## 2012-10-16 NOTE — Patient Instructions (Addendum)
You will need lab work today:  Bmp,Mg level.  We will call you with your results   Your physician recommends that you schedule a follow-up appointment in: 6 months with Device clinic.   Your physician wants you to follow-up in: 1 year with Dr. [name]. You will receive a reminder letter in the mail two months in advance. If you don't receive a letter, please call our office to schedule the follow-up appointment.

## 2012-10-16 NOTE — Assessment & Plan Note (Signed)
No intercurrent Ventricular tachycardia  

## 2012-10-16 NOTE — Assessment & Plan Note (Signed)
Persistent; there is reasonable heart rate control and reasonable functional tolerance. Will not make any adjustments

## 2012-10-16 NOTE — Assessment & Plan Note (Signed)
Working on weight loss

## 2012-10-16 NOTE — Assessment & Plan Note (Signed)
On Coumadin and aspirin  ; target INR is 2.5-3.5

## 2012-10-16 NOTE — Assessment & Plan Note (Signed)
The patient's device was interrogated.  The information was reviewed. No changes were made in the programming.    

## 2012-10-16 NOTE — Assessment & Plan Note (Signed)
Stable on current medications  One adopted child and one natural child who has not yet been evaluated. He has 2 surviving siblings. He will be in touch with an to make sure that they are evaluated also for HCM.

## 2012-10-16 NOTE — Assessment & Plan Note (Signed)
Currently well controlled.  

## 2012-10-19 LAB — POCT INR: INR: 2.7

## 2012-10-21 ENCOUNTER — Ambulatory Visit (INDEPENDENT_AMBULATORY_CARE_PROVIDER_SITE_OTHER): Payer: BC Managed Care – PPO | Admitting: Cardiovascular Disease

## 2012-10-21 DIAGNOSIS — Z9889 Other specified postprocedural states: Secondary | ICD-10-CM

## 2012-10-21 DIAGNOSIS — I4891 Unspecified atrial fibrillation: Secondary | ICD-10-CM

## 2012-10-21 DIAGNOSIS — G459 Transient cerebral ischemic attack, unspecified: Secondary | ICD-10-CM

## 2012-10-24 ENCOUNTER — Telehealth: Payer: Self-pay | Admitting: Internal Medicine

## 2012-10-24 NOTE — Telephone Encounter (Signed)
New Prob      Requesting a refill of his COUMADIN to [facility] in [place].

## 2012-10-24 NOTE — Telephone Encounter (Signed)
Spoke with pt and he's out of town and needs 7 day Rx of Brand Coumadin 10mg s called into [facility] in [place], [place].   Rx for 7 tabs QD of Coumadin 10mg s Brand only called to [phone] to [place] , Pharmacist with 0 refills.

## 2012-11-03 LAB — POCT INR: INR: 3.1

## 2012-11-04 ENCOUNTER — Ambulatory Visit (INDEPENDENT_AMBULATORY_CARE_PROVIDER_SITE_OTHER): Payer: BC Managed Care – PPO | Admitting: Pharmacist

## 2012-11-04 DIAGNOSIS — Z9889 Other specified postprocedural states: Secondary | ICD-10-CM

## 2012-11-04 DIAGNOSIS — G459 Transient cerebral ischemic attack, unspecified: Secondary | ICD-10-CM

## 2012-11-04 DIAGNOSIS — I4891 Unspecified atrial fibrillation: Secondary | ICD-10-CM

## 2012-11-17 LAB — POCT INR: INR: 3.3

## 2012-11-18 ENCOUNTER — Ambulatory Visit (INDEPENDENT_AMBULATORY_CARE_PROVIDER_SITE_OTHER): Payer: BC Managed Care – PPO | Admitting: Internal Medicine

## 2012-11-18 DIAGNOSIS — G459 Transient cerebral ischemic attack, unspecified: Secondary | ICD-10-CM

## 2012-11-18 DIAGNOSIS — I4891 Unspecified atrial fibrillation: Secondary | ICD-10-CM

## 2012-11-18 DIAGNOSIS — Z9889 Other specified postprocedural states: Secondary | ICD-10-CM

## 2012-12-02 ENCOUNTER — Ambulatory Visit (INDEPENDENT_AMBULATORY_CARE_PROVIDER_SITE_OTHER): Payer: BC Managed Care – PPO | Admitting: Cardiology

## 2012-12-02 DIAGNOSIS — G459 Transient cerebral ischemic attack, unspecified: Secondary | ICD-10-CM

## 2012-12-02 DIAGNOSIS — Z9889 Other specified postprocedural states: Secondary | ICD-10-CM

## 2012-12-02 DIAGNOSIS — I4891 Unspecified atrial fibrillation: Secondary | ICD-10-CM

## 2012-12-13 ENCOUNTER — Other Ambulatory Visit: Payer: Self-pay | Admitting: Internal Medicine

## 2012-12-16 ENCOUNTER — Ambulatory Visit (INDEPENDENT_AMBULATORY_CARE_PROVIDER_SITE_OTHER): Payer: BC Managed Care – PPO | Admitting: Cardiology

## 2012-12-16 DIAGNOSIS — Z9889 Other specified postprocedural states: Secondary | ICD-10-CM

## 2012-12-16 DIAGNOSIS — G459 Transient cerebral ischemic attack, unspecified: Secondary | ICD-10-CM

## 2012-12-16 DIAGNOSIS — I4891 Unspecified atrial fibrillation: Secondary | ICD-10-CM

## 2012-12-30 ENCOUNTER — Other Ambulatory Visit: Payer: Self-pay | Admitting: Internal Medicine

## 2013-01-07 ENCOUNTER — Ambulatory Visit (INDEPENDENT_AMBULATORY_CARE_PROVIDER_SITE_OTHER): Payer: BC Managed Care – PPO | Admitting: Cardiology

## 2013-01-07 DIAGNOSIS — Z9889 Other specified postprocedural states: Secondary | ICD-10-CM

## 2013-01-07 DIAGNOSIS — G459 Transient cerebral ischemic attack, unspecified: Secondary | ICD-10-CM

## 2013-01-07 DIAGNOSIS — I4891 Unspecified atrial fibrillation: Secondary | ICD-10-CM

## 2013-01-29 ENCOUNTER — Telehealth: Payer: Self-pay | Admitting: *Deleted

## 2013-01-29 NOTE — Telephone Encounter (Signed)
Pt called stating he works all week out of town  and he forgot to take [employer] with him to do INR and that he will do his INR when he gets home this weekend and will send  INR results  to [name] on Monday October 27.

## 2013-02-03 ENCOUNTER — Ambulatory Visit (INDEPENDENT_AMBULATORY_CARE_PROVIDER_SITE_OTHER): Payer: BC Managed Care – PPO | Admitting: Internal Medicine

## 2013-02-03 DIAGNOSIS — Z9889 Other specified postprocedural states: Secondary | ICD-10-CM

## 2013-02-03 DIAGNOSIS — G459 Transient cerebral ischemic attack, unspecified: Secondary | ICD-10-CM

## 2013-02-03 DIAGNOSIS — I4891 Unspecified atrial fibrillation: Secondary | ICD-10-CM

## 2013-02-13 ENCOUNTER — Other Ambulatory Visit: Payer: Self-pay

## 2013-02-14 ENCOUNTER — Ambulatory Visit (INDEPENDENT_AMBULATORY_CARE_PROVIDER_SITE_OTHER): Payer: BC Managed Care – PPO | Admitting: Pharmacist

## 2013-02-14 DIAGNOSIS — G459 Transient cerebral ischemic attack, unspecified: Secondary | ICD-10-CM

## 2013-02-14 DIAGNOSIS — Z9889 Other specified postprocedural states: Secondary | ICD-10-CM

## 2013-02-14 DIAGNOSIS — I4891 Unspecified atrial fibrillation: Secondary | ICD-10-CM

## 2013-02-21 ENCOUNTER — Telehealth: Payer: Self-pay | Admitting: Internal Medicine

## 2013-02-21 NOTE — Telephone Encounter (Signed)
**Note De-Identified  Obfuscation** This is not Dr Ron Parker or Dr Francesca Oman pt. Will forward to Timmie Foerster, RN, Dr Olin Pia nurse.

## 2013-02-21 NOTE — Telephone Encounter (Signed)
I spoke with patient and explained Dr. [name] was out of office until next week to address this. Until he has a machine he will need to be checked in coumadin clinic. Patient very distraught with having to come into office and states he will lose hundreds of dollars in pay - he is requesting to speak to coumadin clinic. I will forward to Elbert Ewings RN in clinic.

## 2013-02-21 NOTE — Telephone Encounter (Signed)
Talked with pt and obtained his address in [place] and order sent there for him to have INR checked on 02/28/2013 [facility] [id number] [place] [address] Pt states he will go to hosp there and have INR checked  and will have them fax results to our office

## 2013-02-21 NOTE — Telephone Encounter (Signed)
Follow Up   Frank [name] called states that the pt will need a home PTINR meter/// Form faxed 4 days ago/// requesting to have the form faxed back// Will fax a duplicate.

## 2013-03-03 ENCOUNTER — Ambulatory Visit (INDEPENDENT_AMBULATORY_CARE_PROVIDER_SITE_OTHER): Payer: BC Managed Care – PPO | Admitting: Pharmacist

## 2013-03-03 DIAGNOSIS — G459 Transient cerebral ischemic attack, unspecified: Secondary | ICD-10-CM

## 2013-03-03 DIAGNOSIS — Z9889 Other specified postprocedural states: Secondary | ICD-10-CM

## 2013-03-03 DIAGNOSIS — I4891 Unspecified atrial fibrillation: Secondary | ICD-10-CM

## 2013-03-12 ENCOUNTER — Telehealth: Payer: Self-pay | Admitting: Internal Medicine

## 2013-03-12 NOTE — Telephone Encounter (Signed)
Frank [name] had copy of paperwork for MD to sign. Will send message to her for follow up

## 2013-03-12 NOTE — Telephone Encounter (Signed)
New message    Pt is requesting his own PT/INR machine.  This company faxed over the paper work but has not received it back.  Pls call company to let them know if pt can have his own machine.

## 2013-03-14 NOTE — Telephone Encounter (Signed)
Faxed signed paperwork for home INR monitoring

## 2013-03-24 ENCOUNTER — Ambulatory Visit (INDEPENDENT_AMBULATORY_CARE_PROVIDER_SITE_OTHER): Payer: BC Managed Care – PPO | Admitting: General Practice

## 2013-03-24 DIAGNOSIS — Z9889 Other specified postprocedural states: Secondary | ICD-10-CM

## 2013-03-24 DIAGNOSIS — G459 Transient cerebral ischemic attack, unspecified: Secondary | ICD-10-CM | POA: Diagnosis not present

## 2013-03-24 DIAGNOSIS — I4891 Unspecified atrial fibrillation: Secondary | ICD-10-CM

## 2013-03-24 LAB — POCT INR: INR: 2.9

## 2013-04-21 ENCOUNTER — Ambulatory Visit (INDEPENDENT_AMBULATORY_CARE_PROVIDER_SITE_OTHER): Payer: BC Managed Care – PPO | Admitting: *Deleted

## 2013-04-21 DIAGNOSIS — G459 Transient cerebral ischemic attack, unspecified: Secondary | ICD-10-CM | POA: Diagnosis not present

## 2013-04-21 DIAGNOSIS — I4891 Unspecified atrial fibrillation: Secondary | ICD-10-CM | POA: Diagnosis not present

## 2013-04-21 DIAGNOSIS — Z9889 Other specified postprocedural states: Secondary | ICD-10-CM

## 2013-04-21 LAB — POCT INR: INR: 2.6

## 2013-04-22 ENCOUNTER — Telehealth: Payer: Self-pay | Admitting: Internal Medicine

## 2013-04-22 ENCOUNTER — Encounter: Payer: Self-pay | Admitting: *Deleted

## 2013-04-22 ENCOUNTER — Encounter: Payer: Self-pay | Admitting: Internal Medicine

## 2013-04-22 NOTE — Telephone Encounter (Signed)
New Problem:  Carloyn Manner was wanting to clarify that the pt had used home monitoring before and did not need training in setting it up. Carloyn Manner is requesting a call back.

## 2013-04-22 NOTE — Telephone Encounter (Signed)
Spoke with Carloyn Manner with [employer].  Confirmed pt has self-tested for many years in the past.  They will send him a machine with no training and if he has problems, offer in-person training in the future.

## 2013-04-23 ENCOUNTER — Other Ambulatory Visit: Payer: Self-pay

## 2013-04-23 ENCOUNTER — Other Ambulatory Visit: Payer: Self-pay | Admitting: Nurse Practitioner

## 2013-04-23 ENCOUNTER — Telehealth: Payer: Self-pay

## 2013-04-23 DIAGNOSIS — I4891 Unspecified atrial fibrillation: Secondary | ICD-10-CM

## 2013-04-23 MED ORDER — POTASSIUM CHLORIDE CRYS ER 10 MEQ PO TBCR: 80.0000 meq | EXTENDED_RELEASE_TABLET | Freq: Every day | ORAL | Status: DC

## 2013-04-23 MED ORDER — COUMADIN 10 MG PO TABS: ORAL_TABLET | ORAL | Status: DC

## 2013-04-23 NOTE — Telephone Encounter (Signed)
Refiil order printed as requested per pt

## 2013-04-25 ENCOUNTER — Telehealth: Payer: Self-pay

## 2013-04-25 DIAGNOSIS — I4891 Unspecified atrial fibrillation: Secondary | ICD-10-CM

## 2013-04-25 MED ORDER — POTASSIUM CHLORIDE CRYS ER 10 MEQ PO TBCR: 80.0000 meq | EXTENDED_RELEASE_TABLET | Freq: Every day | ORAL | Status: DC

## 2013-04-25 NOTE — Telephone Encounter (Signed)
Refill

## 2013-04-26 ENCOUNTER — Encounter: Payer: Self-pay | Admitting: Internal Medicine

## 2013-04-29 ENCOUNTER — Other Ambulatory Visit: Payer: Self-pay

## 2013-04-29 ENCOUNTER — Telehealth: Payer: Self-pay

## 2013-04-29 NOTE — Telephone Encounter (Signed)
Put rx up front for coumadin

## 2013-05-01 ENCOUNTER — Other Ambulatory Visit: Payer: Self-pay

## 2013-05-01 MED ORDER — COUMADIN 5 MG PO TABS: ORAL_TABLET | ORAL | Status: DC

## 2013-05-19 ENCOUNTER — Ambulatory Visit (INDEPENDENT_AMBULATORY_CARE_PROVIDER_SITE_OTHER): Payer: BC Managed Care – PPO | Admitting: *Deleted

## 2013-05-19 DIAGNOSIS — G459 Transient cerebral ischemic attack, unspecified: Secondary | ICD-10-CM

## 2013-05-19 DIAGNOSIS — I4891 Unspecified atrial fibrillation: Secondary | ICD-10-CM

## 2013-05-19 DIAGNOSIS — Z9889 Other specified postprocedural states: Secondary | ICD-10-CM | POA: Diagnosis not present

## 2013-05-19 DIAGNOSIS — Z5181 Encounter for therapeutic drug level monitoring: Secondary | ICD-10-CM

## 2013-05-19 LAB — POCT INR: INR: 2.8

## 2013-06-25 ENCOUNTER — Encounter: Payer: Self-pay | Admitting: Internal Medicine

## 2013-06-25 ENCOUNTER — Ambulatory Visit (INDEPENDENT_AMBULATORY_CARE_PROVIDER_SITE_OTHER): Payer: Medicare Other | Admitting: *Deleted

## 2013-06-25 DIAGNOSIS — I472 Ventricular tachycardia, unspecified: Secondary | ICD-10-CM

## 2013-06-25 DIAGNOSIS — I421 Obstructive hypertrophic cardiomyopathy: Secondary | ICD-10-CM

## 2013-06-25 DIAGNOSIS — I4891 Unspecified atrial fibrillation: Secondary | ICD-10-CM | POA: Diagnosis not present

## 2013-06-25 DIAGNOSIS — I440 Atrioventricular block, first degree: Secondary | ICD-10-CM | POA: Diagnosis not present

## 2013-06-25 DIAGNOSIS — I4729 Other ventricular tachycardia: Secondary | ICD-10-CM

## 2013-06-25 LAB — MDC_IDC_ENUM_SESS_TYPE_INCLINIC
Battery Remaining Longevity: 55.2 mo
Battery Voltage: 2.9 V
Brady Statistic RA Percent Paced: 66 %
Brady Statistic RV Percent Paced: 97 %
Implantable Pulse Generator Model: 2210
Lead Channel Pacing Threshold Amplitude: 0.75 V
Lead Channel Pacing Threshold Amplitude: 0.75 V
Lead Channel Pacing Threshold Pulse Width: 0.4 ms
Lead Channel Pacing Threshold Pulse Width: 0.4 ms
Lead Channel Pacing Threshold Pulse Width: 0.4 ms
Lead Channel Sensing Intrinsic Amplitude: 2.2 mV
MDC IDC MSMT LEADCHNL RA IMPEDANCE VALUE: 425 Ohm
MDC IDC MSMT LEADCHNL RA PACING THRESHOLD PULSEWIDTH: 0.4 ms
MDC IDC MSMT LEADCHNL RV IMPEDANCE VALUE: 575 Ohm
MDC IDC MSMT LEADCHNL RV PACING THRESHOLD AMPLITUDE: 0.75 V
MDC IDC MSMT LEADCHNL RV PACING THRESHOLD AMPLITUDE: 0.75 V
MDC IDC MSMT LEADCHNL RV SENSING INTR AMPL: 7.2 mV
MDC IDC PG SERIAL: 2272351
MDC IDC SESS DTM: 20150318150852
MDC IDC SET LEADCHNL RA PACING AMPLITUDE: 2 V
MDC IDC SET LEADCHNL RV PACING AMPLITUDE: 2.5 V
MDC IDC SET LEADCHNL RV PACING PULSEWIDTH: 0.4 ms
MDC IDC SET LEADCHNL RV SENSING SENSITIVITY: 2 mV

## 2013-06-27 NOTE — Progress Notes (Signed)
Pacemaker check in clinic. Normal device function. Thresholds, sensing, impedances consistent with previous measurements. Device programmed to maximize longevity. 5534 mode switches (18%)---max dur. 3 days, Max A 327, Max V 169---AF/Sensor related competative pacing + Warfarin. 20 high ventricular rates noted---noise and AF with RVR and NSVT---max dur. 26 bts. 333 noise reversions---recreated through pocket manipulation. Device programmed at appropriate safety margins. Histogram distribution appropriate for patient activity level. Device programmed to optimize intrinsic conduction. Estimated longevity 4.6-5.2 years. Patient enrolled in remote follow-up. Cellular adaptor given. Patient will follow up with SK in July 2015.

## 2013-06-29 LAB — POCT INR: INR: 2.5

## 2013-07-07 ENCOUNTER — Ambulatory Visit (INDEPENDENT_AMBULATORY_CARE_PROVIDER_SITE_OTHER): Payer: BC Managed Care – PPO | Admitting: Internal Medicine

## 2013-07-07 DIAGNOSIS — Z5181 Encounter for therapeutic drug level monitoring: Secondary | ICD-10-CM

## 2013-07-07 DIAGNOSIS — Z9889 Other specified postprocedural states: Secondary | ICD-10-CM

## 2013-07-07 DIAGNOSIS — I4891 Unspecified atrial fibrillation: Secondary | ICD-10-CM

## 2013-07-07 DIAGNOSIS — G459 Transient cerebral ischemic attack, unspecified: Secondary | ICD-10-CM

## 2013-07-21 DIAGNOSIS — I4891 Unspecified atrial fibrillation: Secondary | ICD-10-CM | POA: Diagnosis not present

## 2013-07-21 DIAGNOSIS — Z954 Presence of other heart-valve replacement: Secondary | ICD-10-CM | POA: Diagnosis not present

## 2013-07-22 ENCOUNTER — Ambulatory Visit (INDEPENDENT_AMBULATORY_CARE_PROVIDER_SITE_OTHER): Payer: BC Managed Care – PPO | Admitting: Cardiovascular Disease

## 2013-07-22 DIAGNOSIS — G459 Transient cerebral ischemic attack, unspecified: Secondary | ICD-10-CM

## 2013-07-22 DIAGNOSIS — Z9889 Other specified postprocedural states: Secondary | ICD-10-CM

## 2013-07-22 DIAGNOSIS — I4891 Unspecified atrial fibrillation: Secondary | ICD-10-CM | POA: Diagnosis not present

## 2013-07-22 DIAGNOSIS — Z5181 Encounter for therapeutic drug level monitoring: Secondary | ICD-10-CM | POA: Diagnosis not present

## 2013-07-22 LAB — POCT INR: INR: 2.7

## 2013-07-31 ENCOUNTER — Encounter: Payer: Self-pay | Admitting: Internal Medicine

## 2013-08-01 ENCOUNTER — Other Ambulatory Visit: Payer: Self-pay | Admitting: *Deleted

## 2013-08-01 MED ORDER — COUMADIN 10 MG PO TABS: ORAL_TABLET | ORAL | Status: DC

## 2013-08-05 ENCOUNTER — Other Ambulatory Visit: Payer: Self-pay

## 2013-08-05 DIAGNOSIS — I4891 Unspecified atrial fibrillation: Secondary | ICD-10-CM

## 2013-08-05 MED ORDER — TORSEMIDE 20 MG PO TABS: 20.0000 mg | ORAL_TABLET | Freq: Every day | ORAL | Status: DC

## 2013-08-05 MED ORDER — FUROSEMIDE 40 MG PO TABS: 40.0000 mg | ORAL_TABLET | Freq: Every day | ORAL | Status: DC

## 2013-08-05 MED ORDER — VALSARTAN-HYDROCHLOROTHIAZIDE 80-12.5 MG PO TABS: ORAL_TABLET | ORAL | Status: DC

## 2013-08-05 MED ORDER — POTASSIUM CHLORIDE CRYS ER 10 MEQ PO TBCR: 80.0000 meq | EXTENDED_RELEASE_TABLET | Freq: Every day | ORAL | Status: DC

## 2013-08-07 ENCOUNTER — Ambulatory Visit (INDEPENDENT_AMBULATORY_CARE_PROVIDER_SITE_OTHER): Payer: Medicare Other | Admitting: *Deleted

## 2013-08-07 DIAGNOSIS — G459 Transient cerebral ischemic attack, unspecified: Secondary | ICD-10-CM | POA: Diagnosis not present

## 2013-08-07 DIAGNOSIS — Z9889 Other specified postprocedural states: Secondary | ICD-10-CM

## 2013-08-07 DIAGNOSIS — Z5181 Encounter for therapeutic drug level monitoring: Secondary | ICD-10-CM | POA: Diagnosis not present

## 2013-08-07 DIAGNOSIS — I4891 Unspecified atrial fibrillation: Secondary | ICD-10-CM | POA: Diagnosis not present

## 2013-08-07 LAB — POCT INR: INR: 2.4

## 2013-09-05 ENCOUNTER — Ambulatory Visit (INDEPENDENT_AMBULATORY_CARE_PROVIDER_SITE_OTHER): Payer: Medicare Other | Admitting: Pharmacist

## 2013-09-05 DIAGNOSIS — G459 Transient cerebral ischemic attack, unspecified: Secondary | ICD-10-CM

## 2013-09-05 DIAGNOSIS — I4891 Unspecified atrial fibrillation: Secondary | ICD-10-CM

## 2013-09-05 DIAGNOSIS — Z9889 Other specified postprocedural states: Secondary | ICD-10-CM

## 2013-09-05 DIAGNOSIS — Z5181 Encounter for therapeutic drug level monitoring: Secondary | ICD-10-CM | POA: Diagnosis not present

## 2013-09-05 LAB — POCT INR: INR: 2.6

## 2013-10-16 ENCOUNTER — Ambulatory Visit (INDEPENDENT_AMBULATORY_CARE_PROVIDER_SITE_OTHER): Payer: Medicare Other

## 2013-10-16 DIAGNOSIS — Z9889 Other specified postprocedural states: Secondary | ICD-10-CM | POA: Diagnosis not present

## 2013-10-16 DIAGNOSIS — I4891 Unspecified atrial fibrillation: Secondary | ICD-10-CM | POA: Diagnosis not present

## 2013-10-16 DIAGNOSIS — G459 Transient cerebral ischemic attack, unspecified: Secondary | ICD-10-CM

## 2013-10-16 DIAGNOSIS — Z5181 Encounter for therapeutic drug level monitoring: Secondary | ICD-10-CM | POA: Diagnosis not present

## 2013-10-16 LAB — POCT INR: INR: 2.6

## 2013-10-22 ENCOUNTER — Other Ambulatory Visit: Payer: Self-pay | Admitting: *Deleted

## 2013-10-22 MED ORDER — COUMADIN 10 MG PO TABS: ORAL_TABLET | ORAL | Status: DC

## 2013-11-24 ENCOUNTER — Ambulatory Visit (INDEPENDENT_AMBULATORY_CARE_PROVIDER_SITE_OTHER): Payer: Medicare Other

## 2013-11-24 DIAGNOSIS — Z9889 Other specified postprocedural states: Secondary | ICD-10-CM

## 2013-11-24 DIAGNOSIS — I4891 Unspecified atrial fibrillation: Secondary | ICD-10-CM | POA: Diagnosis not present

## 2013-11-24 DIAGNOSIS — G459 Transient cerebral ischemic attack, unspecified: Secondary | ICD-10-CM

## 2013-11-24 DIAGNOSIS — Z5181 Encounter for therapeutic drug level monitoring: Secondary | ICD-10-CM

## 2013-11-24 LAB — POCT INR: INR: 2.4

## 2013-12-16 ENCOUNTER — Encounter: Payer: BC Managed Care – PPO | Admitting: Internal Medicine

## 2013-12-29 ENCOUNTER — Ambulatory Visit (INDEPENDENT_AMBULATORY_CARE_PROVIDER_SITE_OTHER): Payer: Medicare Other | Admitting: *Deleted

## 2013-12-29 DIAGNOSIS — Z9889 Other specified postprocedural states: Secondary | ICD-10-CM

## 2013-12-29 DIAGNOSIS — I4891 Unspecified atrial fibrillation: Secondary | ICD-10-CM | POA: Diagnosis not present

## 2013-12-29 DIAGNOSIS — Z5181 Encounter for therapeutic drug level monitoring: Secondary | ICD-10-CM | POA: Diagnosis not present

## 2013-12-29 DIAGNOSIS — G459 Transient cerebral ischemic attack, unspecified: Secondary | ICD-10-CM

## 2013-12-29 LAB — POCT INR: INR: 2.7

## 2014-01-23 ENCOUNTER — Other Ambulatory Visit: Payer: Self-pay

## 2014-01-30 ENCOUNTER — Ambulatory Visit (INDEPENDENT_AMBULATORY_CARE_PROVIDER_SITE_OTHER): Payer: Medicare Other

## 2014-01-30 ENCOUNTER — Encounter: Payer: Self-pay | Admitting: Internal Medicine

## 2014-01-30 ENCOUNTER — Ambulatory Visit (INDEPENDENT_AMBULATORY_CARE_PROVIDER_SITE_OTHER): Payer: Medicare Other | Admitting: Internal Medicine

## 2014-01-30 VITALS — BP 126/52 | HR 65 | Ht 72.0 in | Wt 350.2 lb

## 2014-01-30 DIAGNOSIS — T82110D Breakdown (mechanical) of cardiac electrode, subsequent encounter: Secondary | ICD-10-CM

## 2014-01-30 DIAGNOSIS — Z9889 Other specified postprocedural states: Secondary | ICD-10-CM

## 2014-01-30 DIAGNOSIS — T82190D Other mechanical complication of cardiac electrode, subsequent encounter: Secondary | ICD-10-CM | POA: Diagnosis not present

## 2014-01-30 DIAGNOSIS — I441 Atrioventricular block, second degree: Secondary | ICD-10-CM | POA: Diagnosis not present

## 2014-01-30 DIAGNOSIS — I421 Obstructive hypertrophic cardiomyopathy: Secondary | ICD-10-CM

## 2014-01-30 DIAGNOSIS — G459 Transient cerebral ischemic attack, unspecified: Secondary | ICD-10-CM | POA: Diagnosis not present

## 2014-01-30 DIAGNOSIS — I5030 Unspecified diastolic (congestive) heart failure: Secondary | ICD-10-CM

## 2014-01-30 DIAGNOSIS — I4891 Unspecified atrial fibrillation: Secondary | ICD-10-CM | POA: Diagnosis not present

## 2014-01-30 DIAGNOSIS — Z45018 Encounter for adjustment and management of other part of cardiac pacemaker: Secondary | ICD-10-CM

## 2014-01-30 DIAGNOSIS — Z5181 Encounter for therapeutic drug level monitoring: Secondary | ICD-10-CM

## 2014-01-30 LAB — MDC_IDC_ENUM_SESS_TYPE_INCLINIC
Battery Remaining Longevity: 57.6 mo
Battery Voltage: 2.89 V
Brady Statistic RV Percent Paced: 98 %
Date Time Interrogation Session: 20151023152743
Implantable Pulse Generator Model: 2210
Implantable Pulse Generator Serial Number: 2272351
Lead Channel Impedance Value: 562.5 Ohm
Lead Channel Pacing Threshold Amplitude: 0.75 V
Lead Channel Pacing Threshold Amplitude: 0.75 V
Lead Channel Pacing Threshold Amplitude: 1 V
Lead Channel Pacing Threshold Pulse Width: 0.4 ms
Lead Channel Pacing Threshold Pulse Width: 0.4 ms
Lead Channel Pacing Threshold Pulse Width: 0.4 ms
Lead Channel Sensing Intrinsic Amplitude: 2 mV
Lead Channel Setting Pacing Amplitude: 2 V
Lead Channel Setting Pacing Pulse Width: 0.4 ms
MDC IDC MSMT LEADCHNL RA IMPEDANCE VALUE: 387.5 Ohm
MDC IDC MSMT LEADCHNL RA PACING THRESHOLD AMPLITUDE: 1 V
MDC IDC MSMT LEADCHNL RV PACING THRESHOLD PULSEWIDTH: 0.4 ms
MDC IDC MSMT LEADCHNL RV SENSING INTR AMPL: 7.2 mV
MDC IDC SET LEADCHNL RV PACING AMPLITUDE: 2.5 V
MDC IDC SET LEADCHNL RV SENSING SENSITIVITY: 2 mV
MDC IDC STAT BRADY RA PERCENT PACED: 73 %

## 2014-01-30 LAB — BASIC METABOLIC PANEL
BUN: 18 mg/dL (ref 6–23)
CO2: 30 mEq/L (ref 19–32)
Calcium: 9.7 mg/dL (ref 8.4–10.5)
Chloride: 98 mEq/L (ref 96–112)
Creat: 0.83 mg/dL (ref 0.50–1.35)
Glucose, Bld: 77 mg/dL (ref 70–99)
POTASSIUM: 4.3 meq/L (ref 3.5–5.3)
SODIUM: 137 meq/L (ref 135–145)

## 2014-01-30 LAB — POCT INR: INR: 3

## 2014-01-30 LAB — MAGNESIUM: MAGNESIUM: 2.1 mg/dL (ref 1.5–2.5)

## 2014-01-30 MED ORDER — TORSEMIDE 20 MG PO TABS: 40.0000 mg | ORAL_TABLET | Freq: Every day | ORAL | Status: DC

## 2014-01-30 MED ORDER — COUMADIN 10 MG PO TABS: ORAL_TABLET | ORAL | Status: DC

## 2014-01-30 NOTE — Progress Notes (Signed)
Patient Care Team: Marcello Fennel, MD as PCP - General (Family Medicine)   HPI  Frank [name] is a 68 y.o. 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 He has profound first degree heart block and is status post pacemaker implantation. He has struggled with HFpEF although it is most recent visit he had been doing much much better particularly since retired. He is however back to work again and enjoyed. Is doing contract work.  Continues to struggle with asymmetric swelling. His weight remains high he doesn't exercise much.   He previously saw Dr. Reine Just who recommended nocturnal oxygen and a right left heart catheter. The patient deferred on both as he had found that the interval dietary changes from my last visit to do aggressive low-salt diet and a higher diuretic regime had resulted in significant improvement        Echo 2011 showed EF 55% with well functioning MVR. Estimated RVSP was 51.  CPX test done about 2010 demonstrated a VO2 max of 12.2 representing 66.4% predicted. his VO2 adjusted for body weight was 25.9 His slope was 34 and his O2 pulse was 17, 88% of predicted.    Past Medical History  Diagnosis Date  . Hypertrophic cardiomyopathy     s/p myoectomy  . Diastolic CHF, chronic   . Atrial arrhythmia     status post ablation in [place] with complication including damage to the aortic valve  . AV block, 1st degree   . Morbid obesity   . History of melanoma   . HTN (hypertension)   . Atrial fibrillation   . Bradycardia   . Pacemaker  -SJM   . mitral valve replacement-St. Jude's mechanical   . S/P aortic valve repair     Past Surgical History  Procedure Laterality Date  . Mitral valve replacement      mechanical  . Aortic valve replacement      mechanical-following traumatic injury during an ablation procedure  . Pacemaker insertion      [address]   . Septal myectomy       Current Outpatient Prescriptions  Medication Sig Dispense Refill  . Ascorbic Acid (VITAMIN C) 1000 MG tablet Take 2,000 mg by mouth daily.        Marland Kitchen aspirin 81 MG tablet Take 81 mg by mouth daily.      Marland Kitchen COUMADIN 10 MG tablet 1 tablet every day or as directed per coumadin clinic  90 tablet  1  . COUMADIN 5 MG tablet Take as directed by anticoagulation clinic  10 tablet  0  . fish oil-omega-3 fatty acids 1000 MG capsule Take by mouth. 4 capsules daily      . furosemide (LASIX) 40 MG tablet Take 1 tablet (40 mg total) by mouth daily.  90 tablet  1  . magnesium oxide (MAG-OX) 400 MG tablet Take 400 mg by mouth 3 (three) times daily.       . Multiple Vitamin (MULTIVITAMIN) tablet Take 1 tablet by mouth daily.        . potassium chloride (KLOR-CON M10) 10 MEQ tablet Take 8 tablets (80 mEq total) by mouth daily.  720 tablet  1  . torsemide (DEMADEX) 20 MG tablet Take 1 tablet (20 mg total) by mouth daily.  90 tablet  1  . valsartan-hydrochlorothiazide (DIOVAN-HCT) 80-12.5 MG per tablet TAKE 1 TABLET DAILY  90 tablet  1   No current facility-administered medications for  this visit.    No Known Allergies  Review of Systems negative except from HPI and PMH  Physical Exam BP 126/52  Pulse 65  Ht 6' (1.829 m)  Wt 350 lb 3.2 oz (158.85 kg)  BMI 47.49 kg/m2 Well developed and morbidly obese in no acute distress HENT normal E scleral and icterus clear Neck Supple  Device pocket well healed; without hematoma or erythema .dpClear to ausculation  Regular rate and rhythm, mechanical S1  with early systolic murmur Soft with active bowel sounds No clubbing cyanosis 2+ Edema right greater than left Alert and oriented, grossly normal motor and sensory function Skin Warm and Dry  ECG demonstrates a regular wide-complex rhythm with a right bundle branch block pattern there is right axis deviation. Internal micrograms demonstrated this represents to 1 AV conduction from a slow atrial tachycardia at  a rate of 120 beats per minute  Assessment and  Plan  Atrial fibrillation-paroxysmal  Mitral valve replacement-mechanical  Hypertrophic cardiomyopathy status post myectomy  HFpEF  Mobitz 1 second degree heart block  Mechanical lead failure-noise reversion  Pacemaker-St. Jude  The patient has paroxysms of atrial fibrillation and is on anticoagulation appropriately aspirin and warfarin given his mechanical valve  He remains volume overloaded; his diuretic regime is unusual with triple diuretics. We will simplify this by stopping his furosemide and increasing his torsemide. With his right heart failure this may be better for him we may be able to make some headway in his peripheral edema.  I also wonder whether he is nocturnal hypoxemia; he will look into an apparatus to try to measure this. This may help augment diuresis if in fact this is found to be the case.  Was reversion is stable.

## 2014-01-30 NOTE — Patient Instructions (Signed)
You will need lab work today:  Bmp & mg We will call you with your results   You will need bmp & mg drawn again in 2 weeks due to changes in your diuretic medication made today  Your physician has recommended you make the following change in your medication: stop: furosemide                                                                                                                                            Increase:  Torsemide to 40mg  daily  Your physician wants you to follow-up in: 6 months with Dr. Gari Crown will receive a reminder letter in the mail two months in advance. If you don't receive a letter, please call our office to schedule the follow-up appointment.

## 2014-02-08 ENCOUNTER — Encounter: Payer: Self-pay | Admitting: Internal Medicine

## 2014-02-08 ENCOUNTER — Other Ambulatory Visit: Payer: Self-pay | Admitting: Internal Medicine

## 2014-02-11 ENCOUNTER — Other Ambulatory Visit: Payer: Self-pay

## 2014-02-11 DIAGNOSIS — I4891 Unspecified atrial fibrillation: Secondary | ICD-10-CM

## 2014-02-11 MED ORDER — TORSEMIDE 20 MG PO TABS: 40.0000 mg | ORAL_TABLET | Freq: Every day | ORAL | Status: DC

## 2014-02-11 MED ORDER — VALSARTAN-HYDROCHLOROTHIAZIDE 80-12.5 MG PO TABS: ORAL_TABLET | ORAL | Status: DC

## 2014-02-11 MED ORDER — POTASSIUM CHLORIDE CRYS ER 10 MEQ PO TBCR: 80.0000 meq | EXTENDED_RELEASE_TABLET | Freq: Every day | ORAL | Status: DC

## 2014-03-01 ENCOUNTER — Encounter: Payer: Self-pay | Admitting: Internal Medicine

## 2014-03-01 ENCOUNTER — Other Ambulatory Visit: Payer: Self-pay | Admitting: Internal Medicine

## 2014-03-03 ENCOUNTER — Other Ambulatory Visit: Payer: Self-pay | Admitting: *Deleted

## 2014-03-03 MED ORDER — KLOR-CON M10 10 MEQ PO TBCR: 80.0000 meq | EXTENDED_RELEASE_TABLET | Freq: Every day | ORAL | Status: DC

## 2014-03-03 MED ORDER — VALSARTAN-HYDROCHLOROTHIAZIDE 80-12.5 MG PO TABS: ORAL_TABLET | ORAL | Status: DC

## 2014-03-09 ENCOUNTER — Other Ambulatory Visit (INDEPENDENT_AMBULATORY_CARE_PROVIDER_SITE_OTHER): Payer: Medicare Other | Admitting: *Deleted

## 2014-03-09 ENCOUNTER — Ambulatory Visit (INDEPENDENT_AMBULATORY_CARE_PROVIDER_SITE_OTHER): Payer: Medicare Other

## 2014-03-09 ENCOUNTER — Other Ambulatory Visit: Payer: Self-pay | Admitting: *Deleted

## 2014-03-09 DIAGNOSIS — Z5181 Encounter for therapeutic drug level monitoring: Secondary | ICD-10-CM | POA: Diagnosis not present

## 2014-03-09 DIAGNOSIS — I4891 Unspecified atrial fibrillation: Secondary | ICD-10-CM

## 2014-03-09 DIAGNOSIS — G459 Transient cerebral ischemic attack, unspecified: Secondary | ICD-10-CM | POA: Diagnosis not present

## 2014-03-09 DIAGNOSIS — Z79899 Other long term (current) drug therapy: Secondary | ICD-10-CM

## 2014-03-09 DIAGNOSIS — Z9889 Other specified postprocedural states: Secondary | ICD-10-CM | POA: Diagnosis not present

## 2014-03-09 LAB — POCT INR: INR: 4.1

## 2014-03-09 LAB — BASIC METABOLIC PANEL
BUN: 19 mg/dL (ref 6–23)
CALCIUM: 9.2 mg/dL (ref 8.4–10.5)
CO2: 29 mEq/L (ref 19–32)
Chloride: 96 mEq/L (ref 96–112)
Creatinine, Ser: 0.9 mg/dL (ref 0.4–1.5)
GFR: 89.74 mL/min (ref >60.00)
Glucose, Bld: 99 mg/dL (ref 70–99)
Potassium: 3.9 mEq/L (ref 3.5–5.1)
Sodium: 134 mEq/L — ABNORMAL LOW (ref 135–145)

## 2014-03-09 LAB — MAGNESIUM: Magnesium: 2.2 mg/dL (ref 1.5–2.5)

## 2014-03-18 ENCOUNTER — Telehealth: Payer: Self-pay | Admitting: *Deleted

## 2014-03-18 NOTE — Telephone Encounter (Signed)
Faxed clearance to [facility] for colonoscopy. Ok to stop Coumadin, needs Lovenox bridging, per Dr. [name].

## 2014-04-27 ENCOUNTER — Ambulatory Visit (INDEPENDENT_AMBULATORY_CARE_PROVIDER_SITE_OTHER): Payer: Medicare Other

## 2014-04-27 DIAGNOSIS — I4891 Unspecified atrial fibrillation: Secondary | ICD-10-CM

## 2014-04-27 DIAGNOSIS — Z9889 Other specified postprocedural states: Secondary | ICD-10-CM

## 2014-04-27 DIAGNOSIS — Z5181 Encounter for therapeutic drug level monitoring: Secondary | ICD-10-CM | POA: Diagnosis not present

## 2014-04-27 DIAGNOSIS — G459 Transient cerebral ischemic attack, unspecified: Secondary | ICD-10-CM

## 2014-04-27 LAB — POCT INR: INR: 2.9

## 2014-05-07 ENCOUNTER — Ambulatory Visit (HOSPITAL_COMMUNITY)
Admission: RE | Admit: 2014-05-07 | Payer: BC Managed Care – PPO | Source: Ambulatory Visit | Admitting: Gastroenterology

## 2014-05-07 ENCOUNTER — Encounter (HOSPITAL_COMMUNITY): Admission: RE | Payer: Self-pay | Source: Ambulatory Visit

## 2014-05-07 SURGERY — COLONOSCOPY WITH PROPOFOL
Anesthesia: Monitor Anesthesia Care

## 2014-06-12 ENCOUNTER — Ambulatory Visit (INDEPENDENT_AMBULATORY_CARE_PROVIDER_SITE_OTHER): Payer: Medicare Other | Admitting: *Deleted

## 2014-06-12 DIAGNOSIS — I4891 Unspecified atrial fibrillation: Secondary | ICD-10-CM

## 2014-06-12 DIAGNOSIS — Z9889 Other specified postprocedural states: Secondary | ICD-10-CM | POA: Diagnosis not present

## 2014-06-12 DIAGNOSIS — G459 Transient cerebral ischemic attack, unspecified: Secondary | ICD-10-CM | POA: Diagnosis not present

## 2014-06-12 DIAGNOSIS — Z5181 Encounter for therapeutic drug level monitoring: Secondary | ICD-10-CM | POA: Diagnosis not present

## 2014-06-12 LAB — POCT INR: INR: 3.2

## 2014-07-13 ENCOUNTER — Ambulatory Visit (INDEPENDENT_AMBULATORY_CARE_PROVIDER_SITE_OTHER): Payer: Medicare Other | Admitting: *Deleted

## 2014-07-13 DIAGNOSIS — Z9889 Other specified postprocedural states: Secondary | ICD-10-CM | POA: Diagnosis not present

## 2014-07-13 DIAGNOSIS — I4891 Unspecified atrial fibrillation: Secondary | ICD-10-CM | POA: Diagnosis not present

## 2014-07-13 DIAGNOSIS — Z5181 Encounter for therapeutic drug level monitoring: Secondary | ICD-10-CM

## 2014-07-13 DIAGNOSIS — G459 Transient cerebral ischemic attack, unspecified: Secondary | ICD-10-CM | POA: Diagnosis not present

## 2014-07-13 LAB — POCT INR: INR: 2.8

## 2014-08-06 ENCOUNTER — Ambulatory Visit (INDEPENDENT_AMBULATORY_CARE_PROVIDER_SITE_OTHER): Payer: Medicare Other | Admitting: *Deleted

## 2014-08-06 ENCOUNTER — Encounter: Payer: Self-pay | Admitting: Internal Medicine

## 2014-08-06 DIAGNOSIS — Z950 Presence of cardiac pacemaker: Secondary | ICD-10-CM

## 2014-08-06 DIAGNOSIS — I480 Paroxysmal atrial fibrillation: Secondary | ICD-10-CM | POA: Diagnosis not present

## 2014-08-06 DIAGNOSIS — I4729 Other ventricular tachycardia: Secondary | ICD-10-CM

## 2014-08-06 DIAGNOSIS — I472 Ventricular tachycardia: Secondary | ICD-10-CM | POA: Diagnosis not present

## 2014-08-06 LAB — MDC_IDC_ENUM_SESS_TYPE_INCLINIC
Battery Remaining Longevity: 46.8 mo
Brady Statistic RA Percent Paced: 70 %
Brady Statistic RV Percent Paced: 98 %
Implantable Pulse Generator Model: 2210
Implantable Pulse Generator Serial Number: 2272351
Lead Channel Impedance Value: 562.5 Ohm
Lead Channel Pacing Threshold Amplitude: 0.75 V
Lead Channel Pacing Threshold Amplitude: 0.75 V
Lead Channel Pacing Threshold Amplitude: 0.75 V
Lead Channel Pacing Threshold Pulse Width: 0.4 ms
Lead Channel Pacing Threshold Pulse Width: 0.4 ms
Lead Channel Pacing Threshold Pulse Width: 0.4 ms
Lead Channel Sensing Intrinsic Amplitude: 1.8 mV
Lead Channel Sensing Intrinsic Amplitude: 5.9 mV
Lead Channel Setting Pacing Amplitude: 2 V
Lead Channel Setting Pacing Pulse Width: 0.4 ms
Lead Channel Setting Sensing Sensitivity: 2 mV
MDC IDC MSMT BATTERY VOLTAGE: 2.86 V
MDC IDC MSMT LEADCHNL RA IMPEDANCE VALUE: 400 Ohm
MDC IDC MSMT LEADCHNL RA PACING THRESHOLD AMPLITUDE: 0.75 V
MDC IDC MSMT LEADCHNL RV PACING THRESHOLD PULSEWIDTH: 0.4 ms
MDC IDC SESS DTM: 20160428163713
MDC IDC SET LEADCHNL RV PACING AMPLITUDE: 2.5 V

## 2014-08-06 NOTE — Progress Notes (Signed)
Pacemaker check in clinic. Normal device function. Thresholds, sensing, impedances consistent with previous measurements. Device programmed to maximize longevity. 8.1% mode switch + coumadin. 10 high ventricular rates---mostly RV noise; longest true NSVT 26 beats. 129 noise reversions---previously noted. Device programmed at appropriate safety margins. Histogram distribution appropriate for patient activity level. Device programmed to optimize intrinsic conduction. Estimated longevity 4.0-4.70yrs. ROV w/ SK in 82mo.

## 2014-08-10 ENCOUNTER — Ambulatory Visit (INDEPENDENT_AMBULATORY_CARE_PROVIDER_SITE_OTHER): Payer: Medicare Other | Admitting: *Deleted

## 2014-08-10 DIAGNOSIS — G459 Transient cerebral ischemic attack, unspecified: Secondary | ICD-10-CM

## 2014-08-10 DIAGNOSIS — Z5181 Encounter for therapeutic drug level monitoring: Secondary | ICD-10-CM

## 2014-08-10 DIAGNOSIS — I480 Paroxysmal atrial fibrillation: Secondary | ICD-10-CM

## 2014-08-10 DIAGNOSIS — I4891 Unspecified atrial fibrillation: Secondary | ICD-10-CM

## 2014-08-10 DIAGNOSIS — Z9889 Other specified postprocedural states: Secondary | ICD-10-CM

## 2014-08-10 LAB — POCT INR: INR: 3.2

## 2014-08-27 ENCOUNTER — Ambulatory Visit (HOSPITAL_COMMUNITY): Admit: 2014-08-27 | Payer: Medicare Other | Admitting: Gastroenterology

## 2014-08-27 ENCOUNTER — Encounter (HOSPITAL_COMMUNITY): Payer: Self-pay

## 2014-08-27 SURGERY — COLONOSCOPY WITH PROPOFOL
Anesthesia: Monitor Anesthesia Care

## 2014-09-17 ENCOUNTER — Ambulatory Visit (INDEPENDENT_AMBULATORY_CARE_PROVIDER_SITE_OTHER): Payer: Medicare Other | Admitting: *Deleted

## 2014-09-17 DIAGNOSIS — G459 Transient cerebral ischemic attack, unspecified: Secondary | ICD-10-CM | POA: Diagnosis not present

## 2014-09-17 DIAGNOSIS — I4891 Unspecified atrial fibrillation: Secondary | ICD-10-CM

## 2014-09-17 DIAGNOSIS — Z9889 Other specified postprocedural states: Secondary | ICD-10-CM | POA: Diagnosis not present

## 2014-09-17 DIAGNOSIS — I480 Paroxysmal atrial fibrillation: Secondary | ICD-10-CM

## 2014-09-17 DIAGNOSIS — Z5181 Encounter for therapeutic drug level monitoring: Secondary | ICD-10-CM | POA: Diagnosis not present

## 2014-09-17 LAB — POCT INR: INR: 2.9

## 2014-10-05 ENCOUNTER — Other Ambulatory Visit: Payer: Self-pay

## 2014-10-16 ENCOUNTER — Other Ambulatory Visit: Payer: Self-pay | Admitting: *Deleted

## 2014-10-16 MED ORDER — COUMADIN 10 MG PO TABS: ORAL_TABLET | ORAL | Status: DC

## 2014-10-16 NOTE — Telephone Encounter (Signed)
Pt called wanting refill on coumadin and to be sent to [facility]. This nurse called and sent refill as requested to the correct pharmacy [facility] and this pharmacy has confirmed they have received refill request and to refill coumadin only no Warfarin Pt called again by this nurse and informed that his refill has been done and he states understanding.

## 2014-11-02 ENCOUNTER — Ambulatory Visit (INDEPENDENT_AMBULATORY_CARE_PROVIDER_SITE_OTHER): Payer: Medicare Other | Admitting: *Deleted

## 2014-11-02 DIAGNOSIS — G459 Transient cerebral ischemic attack, unspecified: Secondary | ICD-10-CM | POA: Diagnosis not present

## 2014-11-02 DIAGNOSIS — Z9889 Other specified postprocedural states: Secondary | ICD-10-CM

## 2014-11-02 DIAGNOSIS — Z5181 Encounter for therapeutic drug level monitoring: Secondary | ICD-10-CM

## 2014-11-02 DIAGNOSIS — I480 Paroxysmal atrial fibrillation: Secondary | ICD-10-CM

## 2014-11-02 DIAGNOSIS — I4891 Unspecified atrial fibrillation: Secondary | ICD-10-CM | POA: Diagnosis not present

## 2014-11-02 LAB — POCT INR: INR: 2.6

## 2014-11-27 ENCOUNTER — Other Ambulatory Visit: Payer: Self-pay | Admitting: Internal Medicine

## 2014-11-27 ENCOUNTER — Encounter: Payer: Self-pay | Admitting: Internal Medicine

## 2014-11-27 ENCOUNTER — Other Ambulatory Visit: Payer: Self-pay

## 2014-11-27 MED ORDER — VALSARTAN-HYDROCHLOROTHIAZIDE 80-12.5 MG PO TABS: ORAL_TABLET | ORAL | Status: DC

## 2014-11-27 MED ORDER — KLOR-CON M10 10 MEQ PO TBCR: 80.0000 meq | EXTENDED_RELEASE_TABLET | Freq: Every day | ORAL | Status: DC

## 2014-12-15 ENCOUNTER — Ambulatory Visit (INDEPENDENT_AMBULATORY_CARE_PROVIDER_SITE_OTHER): Payer: Medicare Other

## 2014-12-15 DIAGNOSIS — Z5181 Encounter for therapeutic drug level monitoring: Secondary | ICD-10-CM | POA: Diagnosis not present

## 2014-12-15 DIAGNOSIS — I480 Paroxysmal atrial fibrillation: Secondary | ICD-10-CM

## 2014-12-15 DIAGNOSIS — I4891 Unspecified atrial fibrillation: Secondary | ICD-10-CM | POA: Diagnosis not present

## 2014-12-15 DIAGNOSIS — Z9889 Other specified postprocedural states: Secondary | ICD-10-CM

## 2014-12-15 DIAGNOSIS — G459 Transient cerebral ischemic attack, unspecified: Secondary | ICD-10-CM | POA: Diagnosis not present

## 2014-12-15 DIAGNOSIS — C44212 Basal cell carcinoma of skin of right ear and external auricular canal: Secondary | ICD-10-CM | POA: Diagnosis not present

## 2014-12-15 DIAGNOSIS — C44219 Basal cell carcinoma of skin of left ear and external auricular canal: Secondary | ICD-10-CM | POA: Diagnosis not present

## 2014-12-15 LAB — POCT INR: INR: 2.8

## 2015-01-11 ENCOUNTER — Encounter: Payer: Self-pay | Admitting: Internal Medicine

## 2015-02-01 ENCOUNTER — Ambulatory Visit (INDEPENDENT_AMBULATORY_CARE_PROVIDER_SITE_OTHER): Payer: Medicare Other

## 2015-02-01 DIAGNOSIS — G459 Transient cerebral ischemic attack, unspecified: Secondary | ICD-10-CM | POA: Diagnosis not present

## 2015-02-01 DIAGNOSIS — I4891 Unspecified atrial fibrillation: Secondary | ICD-10-CM

## 2015-02-01 DIAGNOSIS — Z9889 Other specified postprocedural states: Secondary | ICD-10-CM

## 2015-02-01 DIAGNOSIS — Z5181 Encounter for therapeutic drug level monitoring: Secondary | ICD-10-CM | POA: Diagnosis not present

## 2015-02-01 DIAGNOSIS — I480 Paroxysmal atrial fibrillation: Secondary | ICD-10-CM

## 2015-02-01 LAB — POCT INR: INR: 2.8

## 2015-02-15 ENCOUNTER — Ambulatory Visit (INDEPENDENT_AMBULATORY_CARE_PROVIDER_SITE_OTHER): Payer: Medicare Other | Admitting: Internal Medicine

## 2015-02-15 ENCOUNTER — Encounter: Payer: Self-pay | Admitting: Internal Medicine

## 2015-02-15 VITALS — BP 122/70 | HR 61 | Ht 72.0 in | Wt 360.0 lb

## 2015-02-15 DIAGNOSIS — I472 Ventricular tachycardia, unspecified: Secondary | ICD-10-CM

## 2015-02-15 DIAGNOSIS — I421 Obstructive hypertrophic cardiomyopathy: Secondary | ICD-10-CM

## 2015-02-15 DIAGNOSIS — Z950 Presence of cardiac pacemaker: Secondary | ICD-10-CM | POA: Diagnosis not present

## 2015-02-15 DIAGNOSIS — I4891 Unspecified atrial fibrillation: Secondary | ICD-10-CM

## 2015-02-15 MED ORDER — TORSEMIDE 20 MG PO TABS: 40.0000 mg | ORAL_TABLET | Freq: Every day | ORAL | Status: DC

## 2015-02-15 MED ORDER — POTASSIUM CHLORIDE ER 10 MEQ PO TBCR: EXTENDED_RELEASE_TABLET | ORAL | Status: DC

## 2015-02-15 MED ORDER — VALSARTAN-HYDROCHLOROTHIAZIDE 80-12.5 MG PO TABS: ORAL_TABLET | ORAL | Status: DC

## 2015-02-15 MED ORDER — COUMADIN 10 MG PO TABS: ORAL_TABLET | ORAL | Status: DC

## 2015-02-15 NOTE — Patient Instructions (Signed)
Medication Instructions: - no changes  Labwork: - none  Procedures/Testing: - none  Follow-Up: - Your physician wants you to follow-up in: 6 months with the device clinic & 1 year with Dr. [name]. You will receive a reminder letter in the mail two months in advance. If you don't receive a letter, please call our office to schedule the follow-up appointment.   Any Additional Special Instructions Will Be Listed Below (If Applicable). - none

## 2015-02-15 NOTE — Progress Notes (Signed)
Patient Care Team: No Pcp Per Patient as PCP - General (General Practice)   HPI  Frank [name] is a 69 y.o. 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 He has profound first degree heart block and is status post pacemaker implantation. He had struggled with HFpEF although most recently  visit he had been doing much much better particularly since retired.  He is feeling better although more recently he has some leveling of improvement  He previously saw Dr. Reine Just who recommended nocturnal oxygen and a right left heart catheter. The patient deferred on both as he had found that the interval dietary changes from my last visit to do aggressive low-salt diet and a higher diuretic regime had resulted in significant improvement   Echo 2011 showed EF 55% with well functioning MVR. Estimated RVSP was 51.  CPX test done about 2010 demonstrated a VO2 max of 12.2 representing 66.4% predicted. his VO2 adjusted for body weight was 25.9 His slope was 34 and his O2 pulse was 17, 88% of predicted.    Past Medical History  Diagnosis Date  . Hypertrophic cardiomyopathy ([place])     s/p myoectomy  . Diastolic CHF, chronic ([place])   . Atrial arrhythmia     status post ablation in [place] with complication including damage to the aortic valve  . AV block, 1st degree   . Morbid obesity ([place])   . History of melanoma   . HTN (hypertension)   . Atrial fibrillation ([place])   . Bradycardia   . Pacemaker  -SJM   . mitral valve replacement-St. Jude's mechanical   . S/P aortic valve repair     Past Surgical History  Procedure Laterality Date  . Mitral valve replacement      mechanical  . Aortic valve replacement      mechanical-following traumatic injury during an ablation procedure  . Pacemaker insertion      [address]   . Septal myectomy      Current Outpatient Prescriptions  Medication Sig Dispense Refill  . Ascorbic Acid  (VITAMIN C) 1000 MG tablet Take 2,000 mg by mouth daily.      Marland Kitchen aspirin 81 MG tablet Take 81 mg by mouth daily.    Marland Kitchen COUMADIN 10 MG tablet TAKE AS DIRECTED PER COUMADIN CLINIC 100 tablet 3  . fish oil-omega-3 fatty acids 1000 MG capsule Take by mouth. 4 capsules daily    . Magnesium Oxide 250 MG TABS Take 1 tablet by mouth 3 (three) times daily.    . Multiple Vitamin (MULTIVITAMIN) tablet Take 1 tablet by mouth daily.      Marland Kitchen torsemide (DEMADEX) 20 MG tablet Take 2 tablets (40 mg total) by mouth daily. 180 tablet 3  . valsartan-hydrochlorothiazide (DIOVAN-HCT) 80-12.5 MG tablet TAKE 1 TABLET DAILY 90 tablet 3  . potassium chloride (K-DUR) 10 MEQ tablet Take 8 tablets (80 meq) by mouth once daily 720 tablet 3   No current facility-administered medications for this visit.    No Known Allergies  Review of Systems negative except from HPI and PMH  Physical Exam BP 122/70 mmHg  Pulse 61  Ht 6' (1.829 m)  Wt 360 lb (163.295 kg)  BMI 48.81 kg/m2 Well developed and morbidly obese in no acute distress HENT normal E scleral and icterus clear Neck Supple  Device pocket well healed; without hematoma or erythema .dpClear to ausculation  Regular rate and rhythm, mechanical S1  with  early systolic murmur Soft with active bowel sounds No clubbing cyanosis 2+ Edema right greater than left Alert and oriented, grossly normal motor and sensory function Skin Warm and Dry  ECG demonstrates a regular wide-complex rhythm with a right bundle branch block pattern there is right axis deviation. Internal micrograms demonstrated this represents to 1 AV conduction from a slow atrial tachycardia at a rate of 120 beats per minute  Assessment and  Plan  Atrial fibrillation-paroxysmal  Mitral valve replacement-mechanical  Hypertrophic cardiomyopathy status post myectomy  HFpEF  Mobitz 1 second degree heart block  Mechanical lead failure-noise reversion   A and V   Pacemaker-St. Jude     He remains  volume overloaded, but is content with his fluid management.  I am inclined at next visit to reassess LV function   He continues with noise on both leads   Continue with his current diuretics

## 2015-02-16 LAB — CUP PACEART INCLINIC DEVICE CHECK
Battery Voltage: 2.86 V
Date Time Interrogation Session: 20161107201009
Implantable Lead Implant Date: 20100414
Implantable Lead Implant Date: 20100414
Implantable Lead Location: 753859
Lead Channel Impedance Value: 400 Ohm
Lead Channel Impedance Value: 562.5 Ohm
Lead Channel Pacing Threshold Pulse Width: 0.4 ms
Lead Channel Sensing Intrinsic Amplitude: 6.5 mV
Lead Channel Setting Pacing Amplitude: 2 V
Lead Channel Setting Pacing Pulse Width: 0.4 ms
MDC IDC LEAD LOCATION: 753860
MDC IDC MSMT BATTERY REMAINING LONGEVITY: 46.8
MDC IDC MSMT LEADCHNL RA PACING THRESHOLD AMPLITUDE: 0.75 V
MDC IDC MSMT LEADCHNL RA PACING THRESHOLD PULSEWIDTH: 0.4 ms
MDC IDC MSMT LEADCHNL RA SENSING INTR AMPL: 1.4 mV
MDC IDC MSMT LEADCHNL RV PACING THRESHOLD AMPLITUDE: 0.75 V
MDC IDC PG SERIAL: 2272351
MDC IDC SET LEADCHNL RV PACING AMPLITUDE: 2.5 V
MDC IDC SET LEADCHNL RV SENSING SENSITIVITY: 2 mV
MDC IDC STAT BRADY RA PERCENT PACED: 77 %
MDC IDC STAT BRADY RV PERCENT PACED: 98 %

## 2015-03-15 ENCOUNTER — Ambulatory Visit (INDEPENDENT_AMBULATORY_CARE_PROVIDER_SITE_OTHER): Payer: Medicare Other | Admitting: *Deleted

## 2015-03-15 DIAGNOSIS — Z9889 Other specified postprocedural states: Secondary | ICD-10-CM | POA: Diagnosis not present

## 2015-03-15 DIAGNOSIS — Z5181 Encounter for therapeutic drug level monitoring: Secondary | ICD-10-CM | POA: Diagnosis not present

## 2015-03-15 DIAGNOSIS — I4891 Unspecified atrial fibrillation: Secondary | ICD-10-CM

## 2015-03-15 DIAGNOSIS — G459 Transient cerebral ischemic attack, unspecified: Secondary | ICD-10-CM | POA: Diagnosis not present

## 2015-03-15 LAB — POCT INR: INR: 2.7

## 2015-03-22 ENCOUNTER — Encounter: Payer: Self-pay | Admitting: Internal Medicine

## 2015-04-27 DIAGNOSIS — L905 Scar conditions and fibrosis of skin: Secondary | ICD-10-CM | POA: Diagnosis not present

## 2015-05-03 ENCOUNTER — Ambulatory Visit (INDEPENDENT_AMBULATORY_CARE_PROVIDER_SITE_OTHER): Payer: Medicare Other

## 2015-05-03 DIAGNOSIS — Z9889 Other specified postprocedural states: Secondary | ICD-10-CM

## 2015-05-03 DIAGNOSIS — Z5181 Encounter for therapeutic drug level monitoring: Secondary | ICD-10-CM

## 2015-05-03 DIAGNOSIS — I4891 Unspecified atrial fibrillation: Secondary | ICD-10-CM | POA: Diagnosis not present

## 2015-05-03 DIAGNOSIS — G459 Transient cerebral ischemic attack, unspecified: Secondary | ICD-10-CM

## 2015-05-03 LAB — POCT INR: INR: 2.5

## 2015-06-09 ENCOUNTER — Telehealth: Payer: Self-pay | Admitting: Internal Medicine

## 2015-06-09 NOTE — Telephone Encounter (Signed)
New message     Pt has been experiencing an usual low energy level for the last 2 weeks.  He is out of town and will be back Saturday.  He does not have his remote box with him.  Should he come in to see the doctor when he returns?  Should he send in a remote transmission when he returns?  Please call

## 2015-06-09 NOTE — Telephone Encounter (Signed)
Returned patient's call. He confirms that he has been lacking energy lately and has been experiencing SOB. He denies chest pain. He is out of town until Saturday and does not have a home monitor to send a transmission. Due to the device clinic schedule being blocked next week I was unable to get him in next week to be seen. No availability until 06/23/15, he reports that he will be out of town that morning until 07/10/15. I put him on the schedule for device clinic 07/12/15 I will call back if anything else becomes available next week. He is Patent attorney.   Will forward to scheduling to see if we can get him on an APP schedule next week and arrange industry to check PPM.

## 2015-06-09 NOTE — Telephone Encounter (Signed)
Pt calling to reschedule device check to Friday 06/11/15. He will drive home tomorrow as he does not think this can wait. Scheduled 06/11/15 at 9am.

## 2015-06-11 ENCOUNTER — Encounter: Payer: Self-pay | Admitting: Internal Medicine

## 2015-06-11 ENCOUNTER — Ambulatory Visit (INDEPENDENT_AMBULATORY_CARE_PROVIDER_SITE_OTHER): Payer: Medicare Other | Admitting: *Deleted

## 2015-06-11 DIAGNOSIS — Z950 Presence of cardiac pacemaker: Secondary | ICD-10-CM | POA: Diagnosis not present

## 2015-06-11 LAB — CUP PACEART INCLINIC DEVICE CHECK
Battery Remaining Longevity: 36 mo
Brady Statistic RA Percent Paced: 71 %
Brady Statistic RV Percent Paced: 97 %
Date Time Interrogation Session: 20170303093621
Implantable Lead Location: 753860
Lead Channel Impedance Value: 562.5 Ohm
Lead Channel Pacing Threshold Amplitude: 0.75 V
Lead Channel Pacing Threshold Amplitude: 0.75 V
Lead Channel Pacing Threshold Pulse Width: 0.4 ms
Lead Channel Pacing Threshold Pulse Width: 0.4 ms
Lead Channel Setting Sensing Sensitivity: 2 mV
MDC IDC LEAD IMPLANT DT: 20100414
MDC IDC LEAD IMPLANT DT: 20100414
MDC IDC LEAD LOCATION: 753859
MDC IDC MSMT BATTERY VOLTAGE: 2.83 V
MDC IDC MSMT LEADCHNL RA IMPEDANCE VALUE: 387.5 Ohm
MDC IDC MSMT LEADCHNL RA SENSING INTR AMPL: 2.4 mV
MDC IDC MSMT LEADCHNL RV PACING THRESHOLD AMPLITUDE: 0.75 V
MDC IDC MSMT LEADCHNL RV PACING THRESHOLD AMPLITUDE: 0.75 V
MDC IDC MSMT LEADCHNL RV PACING THRESHOLD PULSEWIDTH: 0.4 ms
MDC IDC MSMT LEADCHNL RV PACING THRESHOLD PULSEWIDTH: 0.4 ms
MDC IDC MSMT LEADCHNL RV SENSING INTR AMPL: 6.6 mV
MDC IDC PG SERIAL: 2272351
MDC IDC SET LEADCHNL RA PACING AMPLITUDE: 2 V
MDC IDC SET LEADCHNL RV PACING AMPLITUDE: 2.5 V
MDC IDC SET LEADCHNL RV PACING PULSEWIDTH: 0.4 ms

## 2015-06-11 NOTE — Progress Notes (Signed)
Pacemaker check in clinic for pt c/o SHOB/fatigue x 2 weeks. Pt not in visible distress. He reports SHOB with minimal activity. LE edema stable per patient, he denies any weight gain. He reports that he has been taking his torsemide as directed.   Normal device function. Thresholds, sensing, impedances consistent with previous measurements. Device programmed to maximize longevity. 1.2% AT/AF +warfarin. 5 high ventricular rates. EGMs show A and RV noise- previously noted. Device programmed at appropriate safety margins. Histogram distribution appropriate for patient activity level. Device programmed to optimize intrinsic conduction. Estimated longevity 3-3.3 years.   Per Dr. Olin Pia last note in November- he is inclined to re-evaluate LV function at next OV. Last BMP 02/2014- pt reports that he does not have a PCP and has not had any recent blood work. Will send to Alvis Lemmings, RN and Dr. [name] for further recommendations.

## 2015-06-14 ENCOUNTER — Ambulatory Visit (INDEPENDENT_AMBULATORY_CARE_PROVIDER_SITE_OTHER): Payer: Medicare Other | Admitting: *Deleted

## 2015-06-14 DIAGNOSIS — I4891 Unspecified atrial fibrillation: Secondary | ICD-10-CM

## 2015-06-14 DIAGNOSIS — Z9889 Other specified postprocedural states: Secondary | ICD-10-CM | POA: Diagnosis not present

## 2015-06-14 DIAGNOSIS — Z5181 Encounter for therapeutic drug level monitoring: Secondary | ICD-10-CM | POA: Diagnosis not present

## 2015-06-14 DIAGNOSIS — G459 Transient cerebral ischemic attack, unspecified: Secondary | ICD-10-CM

## 2015-06-14 LAB — POCT INR: INR: 2.5

## 2015-06-17 NOTE — Progress Notes (Signed)
Reviewed with Dr. [name]- he would like the patient to follow up with Scott/ Lori.  Message forwarded to [facility] to please try to contact the patient to schedule follow up.

## 2015-06-28 ENCOUNTER — Ambulatory Visit: Payer: Medicare Other | Admitting: Physician Assistant

## 2015-07-12 ENCOUNTER — Encounter: Payer: Self-pay | Admitting: Physician Assistant

## 2015-07-12 ENCOUNTER — Ambulatory Visit (INDEPENDENT_AMBULATORY_CARE_PROVIDER_SITE_OTHER): Payer: Medicare Other | Admitting: Physician Assistant

## 2015-07-12 VITALS — BP 132/76 | HR 60 | Ht 72.0 in | Wt 367.8 lb

## 2015-07-12 DIAGNOSIS — R0602 Shortness of breath: Secondary | ICD-10-CM

## 2015-07-12 DIAGNOSIS — Z954 Presence of other heart-valve replacement: Secondary | ICD-10-CM | POA: Diagnosis not present

## 2015-07-12 DIAGNOSIS — R0609 Other forms of dyspnea: Secondary | ICD-10-CM

## 2015-07-12 DIAGNOSIS — I519 Heart disease, unspecified: Secondary | ICD-10-CM

## 2015-07-12 DIAGNOSIS — Z952 Presence of prosthetic heart valve: Secondary | ICD-10-CM

## 2015-07-12 NOTE — Patient Instructions (Signed)
Medication Instructions:  Your physician recommends that you continue on your current medications as directed. Please refer to the Current Medication list given to you today.   Labwork: Your physician recommends that you return for a FASTING lipid profile on the day of your echo April 19   Testing/Procedures: Your physician has requested that you have an echocardiogram. Echocardiography is a painless test that uses sound waves to create images of your heart. It provides your doctor with information about the size and shape of your heart and how well your heart's chambers and valves are working. This procedure takes approximately one hour. There are no restrictions for this procedure.     Follow-Up: Your physician recommends that you keep your scheduled  follow-up appointment with Dr. [name]   Any Other Special Instructions Will Be Listed Below (If Applicable).  You have been referred to [facility] you will need to call and make appointment with the list that was given today at your office visit.    If you need a refill on your cardiac medications before your next appointment, please call your pharmacy.

## 2015-07-12 NOTE — Assessment & Plan Note (Signed)
Weight loss is imperative.

## 2015-07-12 NOTE — Assessment & Plan Note (Signed)
Patient has worsening dyspnea on exertion that's been gradual over the past year. His weight continues to creep up and I suspect most of his symptoms are probably due to his obesity. He has not had an echo 2015. Recent pacer check was stable on 06/11/15 Will repeat 2-D echo to check his LV function and heart valve. No evidence of heart failure and exam. He does have chronic lower extremity edema that is managed with torsemide. May start low level exercise. Follow-up with Dr. [name]

## 2015-07-12 NOTE — Assessment & Plan Note (Signed)
Patient is on Coumadin. Check 2-D echo

## 2015-07-12 NOTE — Assessment & Plan Note (Signed)
Blood Pressure is stable 

## 2015-07-12 NOTE — Assessment & Plan Note (Signed)
Patient is not on a rate lowering medications. Recent pacer check was stable. He is on Coumadin.

## 2015-07-12 NOTE — Assessment & Plan Note (Signed)
Chronic diastolic heart failure, compensated continue torsemide. Check 2-D echo

## 2015-07-12 NOTE — Progress Notes (Signed)
Cardiology Office Note   Date:  07/12/2015   ID:  Frank [name], DOB February 12, 1946, MRN [id number]  PCP:  No PCP Per Patient  Cardiologist: Dr. [name] Chief Complaint: Shortness of breath and fatigue    History of Present Illness: Frank [name] is a 70 y.o. male who presents for evaluation of shortness of breath and fatigue. He has a history of hypertrophic cardiomyopathy status post myectomy with mitral valve replacement and aortic valve repair. He has PAF He was found to be in profound first-degree heart block is status post ace maker implantation. The patient has previously been recommended to have nocturnal oxygen and a right heart catheterization but patient deferred both found interval dietary changes and higher diuretics he had significant improvement.  2-D echo in 2011 EF 55% with well-functioning MVR. Estimated RVSP was 51. CPX test done 2010 demonstrated VO2 max of 12.2 representing 66.4% predicted,his VO2 adjusted for body weight was 25.9 His slope was 34 and his O2 pulse was 17, 88% of predicted. Recent pacemaker check on 06/11/15 showed normal device function.  Complains of dyspnea on exertion with little activity. He says his legs give out and he just is very fatigued. He knows he is out of shape and does not exercise. He does not have a primary care and has not had blood work in 2 years. Just got back from a cruise and the hot weather made it worse. He says it's been gradual over the past year. Weight is up 7 lbs since last seen. He said he gained 12 pounds on the cruise and is actually losing some of it now. Wants to start exercising but wanted to be checked before he starts.     Past Medical History  Diagnosis Date  . Hypertrophic cardiomyopathy ([place])     s/p myoectomy  . Diastolic CHF, chronic ([place])   . Atrial arrhythmia     status post ablation in [place] with complication including damage to the aortic valve  . AV block, 1st degree   . Morbid obesity ([place])   . History of  melanoma   . HTN (hypertension)   . Atrial fibrillation ([place])   . Bradycardia   . Pacemaker  -SJM   . mitral valve replacement-St. Jude's mechanical   . S/P aortic valve repair     Past Surgical History  Procedure Laterality Date  . Mitral valve replacement      mechanical  . Aortic valve replacement      mechanical-following traumatic injury during an ablation procedure  . Pacemaker insertion      [address]   . Septal myectomy       Current Outpatient Prescriptions  Medication Sig Dispense Refill  . Ascorbic Acid (VITAMIN C) 1000 MG tablet Take 2,000 mg by mouth daily.      Marland Kitchen aspirin 81 MG tablet Take 81 mg by mouth daily.    Marland Kitchen COUMADIN 10 MG tablet TAKE AS DIRECTED PER COUMADIN CLINIC 100 tablet 3  . fish oil-omega-3 fatty acids 1000 MG capsule Take by mouth. 4 capsules daily    . Magnesium Oxide 250 MG TABS Take 1 tablet by mouth 3 (three) times daily.    . Multiple Vitamin (MULTIVITAMIN) tablet Take 1 tablet by mouth daily.      . potassium chloride (K-DUR) 10 MEQ tablet Take 8 tablets (80 meq) by mouth once daily 720 tablet 3  . torsemide (DEMADEX) 20 MG tablet Take 2 tablets (40 mg total) by mouth daily.  180 tablet 3  . valsartan-hydrochlorothiazide (DIOVAN-HCT) 80-12.5 MG tablet TAKE 1 TABLET DAILY 90 tablet 3   No current facility-administered medications for this visit.    Allergies:   Review of patient's allergies indicates no known allergies.    Social History:  The patient  reports that he quit smoking about 38 years ago. His smoking use included Cigarettes. He has never used smokeless tobacco. He reports that he drinks alcohol. He reports that he does not use illicit drugs.   Family History:  The patient's    family history includes Cancer in his sister; Colon cancer in his father; Heart disease in his brother, father, and sister.    ROS:  Please see the history of present illness.   Otherwise, review of systems are positive for none.   All other systems  are reviewed and negative. `   PHYSICAL EXAM: VS:  BP 132/76 mmHg  Pulse 60  Ht 6' (1.829 m)  Wt 367 lb 12.8 oz (166.833 kg)  BMI 49.87 kg/m2 , BMI Body mass index is 49.87 kg/(m^2). GEN: Obese, in no acute distress Neck: no JVD, HJR, carotid bruits, or masses Cardiac: RRR; crisp valve, no rubs, thrill or heave,  Respiratory:  Decreased breath sounds but clear to auscultation bilaterally, normal work of breathing GI: soft, nontender, nondistended, + BS MS: no deformity or atrophy Extremities: Chronic lower extremity edema right worse in left.  Skin: warm and dry, no rash Neuro:  Strength and sensation are intact    EKG:  EKG is ordered today. The ekg ordered today demonstrates ventricular paced at 60 bpm   Recent Labs: No results found for requested labs within last 365 days.    Lipid Panel No results found for: CHOL, TRIG, HDL, CHOLHDL, VLDL, LDLCALC, LDLDIRECT    Wt Readings from Last 3 Encounters:  07/12/15 367 lb 12.8 oz (166.833 kg)  02/15/15 360 lb (163.295 kg)  01/30/14 350 lb 3.2 oz (158.85 kg)      Other studies Reviewed: Additional studies/ records that were reviewed today include and review of the records demonstrates:   ASSESSMENT AND PLAN:   Signed, Ermalinda Barrios, PA-C  07/12/2015 9:09 AM    [address], [place], [place]  [address] Phone: [phone]; Fax: [phone]

## 2015-07-12 NOTE — Assessment & Plan Note (Signed)
Dyspnea on exertion has gradually worsened over the past year. I suspect it's multifactorial and mostly related to his obesity and lack of exercise. Will check 2-D echo to evaluate LV function and heart valve. Will also check labs as he hasn't had any in 2 years. Referred to primary care for further management. Patient thinks he has gout would like to be seen.

## 2015-07-13 NOTE — Telephone Encounter (Signed)
SPOKE WITH  PT   AND URGED PT  TO  GO TO   URGENT CARE  TO  HAVE  GOUT ISSUE ADDRESSED .PER PT  IS  PLANNING TO DO  THAT .[name]

## 2015-07-28 ENCOUNTER — Ambulatory Visit (HOSPITAL_COMMUNITY): Payer: Medicare Other | Attending: Cardiovascular Disease

## 2015-07-28 ENCOUNTER — Other Ambulatory Visit: Payer: Self-pay

## 2015-07-28 ENCOUNTER — Ambulatory Visit (INDEPENDENT_AMBULATORY_CARE_PROVIDER_SITE_OTHER): Payer: Medicare Other | Admitting: *Deleted

## 2015-07-28 ENCOUNTER — Other Ambulatory Visit (INDEPENDENT_AMBULATORY_CARE_PROVIDER_SITE_OTHER): Payer: Medicare Other

## 2015-07-28 DIAGNOSIS — I509 Heart failure, unspecified: Secondary | ICD-10-CM | POA: Diagnosis not present

## 2015-07-28 DIAGNOSIS — I4891 Unspecified atrial fibrillation: Secondary | ICD-10-CM

## 2015-07-28 DIAGNOSIS — Z954 Presence of other heart-valve replacement: Secondary | ICD-10-CM | POA: Diagnosis not present

## 2015-07-28 DIAGNOSIS — Z9889 Other specified postprocedural states: Secondary | ICD-10-CM | POA: Diagnosis not present

## 2015-07-28 DIAGNOSIS — Z6841 Body Mass Index (BMI) 40.0 and over, adult: Secondary | ICD-10-CM | POA: Insufficient documentation

## 2015-07-28 DIAGNOSIS — R0602 Shortness of breath: Secondary | ICD-10-CM | POA: Insufficient documentation

## 2015-07-28 DIAGNOSIS — I519 Heart disease, unspecified: Secondary | ICD-10-CM | POA: Diagnosis not present

## 2015-07-28 DIAGNOSIS — Z952 Presence of prosthetic heart valve: Secondary | ICD-10-CM | POA: Diagnosis not present

## 2015-07-28 DIAGNOSIS — Z5181 Encounter for therapeutic drug level monitoring: Secondary | ICD-10-CM

## 2015-07-28 DIAGNOSIS — I480 Paroxysmal atrial fibrillation: Secondary | ICD-10-CM

## 2015-07-28 DIAGNOSIS — I110 Hypertensive heart disease with heart failure: Secondary | ICD-10-CM | POA: Insufficient documentation

## 2015-07-28 DIAGNOSIS — G459 Transient cerebral ischemic attack, unspecified: Secondary | ICD-10-CM | POA: Diagnosis not present

## 2015-07-28 DIAGNOSIS — I352 Nonrheumatic aortic (valve) stenosis with insufficiency: Secondary | ICD-10-CM | POA: Diagnosis not present

## 2015-07-28 DIAGNOSIS — R0600 Dyspnea, unspecified: Secondary | ICD-10-CM | POA: Diagnosis present

## 2015-07-28 LAB — CBC WITH DIFFERENTIAL/PLATELET
BASOS PCT: 1 %
Basophils Absolute: 83 cells/uL (ref 0–200)
EOS ABS: 166 {cells}/uL (ref 15–500)
EOS PCT: 2 %
HCT: 41.8 % (ref 38.5–50.0)
HEMOGLOBIN: 14 g/dL (ref 13.2–17.1)
LYMPHS ABS: 1245 {cells}/uL (ref 850–3900)
Lymphocytes Relative: 15 %
MCH: 32.4 pg (ref 27.0–33.0)
MCHC: 33.5 g/dL (ref 32.0–36.0)
MCV: 96.8 fL (ref 80.0–100.0)
MONOS PCT: 10 %
MPV: 10.5 fL (ref 7.5–12.5)
Monocytes Absolute: 830 cells/uL (ref 200–950)
NEUTROS ABS: 5976 {cells}/uL (ref 1500–7800)
Neutrophils Relative %: 72 %
PLATELETS: 223 10*3/uL (ref 140–400)
RBC: 4.32 MIL/uL (ref 4.20–5.80)
RDW: 14.4 % (ref 11.0–15.0)
WBC: 8.3 10*3/uL (ref 3.8–10.8)

## 2015-07-28 LAB — COMPREHENSIVE METABOLIC PANEL
ALBUMIN: 4.2 g/dL (ref 3.6–5.1)
ALT: 15 U/L (ref 9–46)
AST: 24 U/L (ref 10–35)
Alkaline Phosphatase: 56 U/L (ref 40–115)
BILIRUBIN TOTAL: 1.1 mg/dL (ref 0.2–1.2)
BUN: 21 mg/dL (ref 7–25)
CALCIUM: 8.9 mg/dL (ref 8.6–10.3)
CO2: 30 mmol/L (ref 20–31)
Chloride: 97 mmol/L — ABNORMAL LOW (ref 98–110)
Creat: 0.99 mg/dL (ref 0.70–1.25)
GLUCOSE: 120 mg/dL — AB (ref 65–99)
POTASSIUM: 3.9 mmol/L (ref 3.5–5.3)
Sodium: 138 mmol/L (ref 135–146)
Total Protein: 7.1 g/dL (ref 6.1–8.1)

## 2015-07-28 LAB — LIPID PANEL
CHOL/HDL RATIO: 5.2 ratio — AB (ref <=5.0)
CHOLESTEROL: 176 mg/dL (ref 125–200)
HDL: 34 mg/dL — AB (ref >=40)
LDL CALC: 113 mg/dL (ref <130)
TRIGLYCERIDES: 143 mg/dL (ref <150)
VLDL: 29 mg/dL (ref <30)

## 2015-07-28 LAB — MAGNESIUM: Magnesium: 2.1 mg/dL (ref 1.5–2.5)

## 2015-07-28 LAB — BRAIN NATRIURETIC PEPTIDE: Brain Natriuretic Peptide: 31.3 pg/mL (ref <100)

## 2015-07-28 LAB — POCT INR: INR: 2.4

## 2015-08-06 ENCOUNTER — Ambulatory Visit (INDEPENDENT_AMBULATORY_CARE_PROVIDER_SITE_OTHER): Payer: Medicare Other | Admitting: *Deleted

## 2015-08-06 DIAGNOSIS — G459 Transient cerebral ischemic attack, unspecified: Secondary | ICD-10-CM

## 2015-08-06 DIAGNOSIS — Z5181 Encounter for therapeutic drug level monitoring: Secondary | ICD-10-CM

## 2015-08-06 DIAGNOSIS — Z9889 Other specified postprocedural states: Secondary | ICD-10-CM | POA: Diagnosis not present

## 2015-08-06 DIAGNOSIS — I4891 Unspecified atrial fibrillation: Secondary | ICD-10-CM

## 2015-08-06 DIAGNOSIS — I480 Paroxysmal atrial fibrillation: Secondary | ICD-10-CM

## 2015-08-06 LAB — POCT INR: INR: 2.7

## 2015-08-26 ENCOUNTER — Telehealth: Payer: Self-pay | Admitting: Pharmacist

## 2015-08-26 NOTE — Telephone Encounter (Signed)
Pt having colonoscopy on 11/03/15 with Dr. Michail Sermon at [facility]. Needs to come off Coumadin for 5 days. Will need bridging given mechanical AVR with afib plus history of TIA. Has used Lovenox in the past but complained about bruising and wanted Arixtra instead. Cannot use Arixtra pre colonoscopy due to half life of 72 hours. Could do Lovenox pre procedure and Arixtra after, see pt email 03/22/15. Will coordinate bridging in Coumadin clinic. Faxed clearance back to [facility] [phone].

## 2015-09-13 ENCOUNTER — Ambulatory Visit (INDEPENDENT_AMBULATORY_CARE_PROVIDER_SITE_OTHER): Payer: Medicare Other | Admitting: *Deleted

## 2015-09-13 ENCOUNTER — Encounter: Payer: Self-pay | Admitting: Internal Medicine

## 2015-09-13 ENCOUNTER — Ambulatory Visit (INDEPENDENT_AMBULATORY_CARE_PROVIDER_SITE_OTHER): Payer: Medicare Other | Admitting: Internal Medicine

## 2015-09-13 VITALS — BP 124/76 | HR 50 | Ht 72.0 in | Wt 363.0 lb

## 2015-09-13 DIAGNOSIS — I441 Atrioventricular block, second degree: Secondary | ICD-10-CM

## 2015-09-13 DIAGNOSIS — I4891 Unspecified atrial fibrillation: Secondary | ICD-10-CM

## 2015-09-13 DIAGNOSIS — Z950 Presence of cardiac pacemaker: Secondary | ICD-10-CM

## 2015-09-13 DIAGNOSIS — I421 Obstructive hypertrophic cardiomyopathy: Secondary | ICD-10-CM

## 2015-09-13 DIAGNOSIS — G459 Transient cerebral ischemic attack, unspecified: Secondary | ICD-10-CM | POA: Diagnosis not present

## 2015-09-13 DIAGNOSIS — Z9889 Other specified postprocedural states: Secondary | ICD-10-CM

## 2015-09-13 DIAGNOSIS — I480 Paroxysmal atrial fibrillation: Secondary | ICD-10-CM

## 2015-09-13 DIAGNOSIS — I5030 Unspecified diastolic (congestive) heart failure: Secondary | ICD-10-CM

## 2015-09-13 DIAGNOSIS — Z5181 Encounter for therapeutic drug level monitoring: Secondary | ICD-10-CM

## 2015-09-13 LAB — CUP PACEART INCLINIC DEVICE CHECK
Brady Statistic RV Percent Paced: 95 %
Date Time Interrogation Session: 20170605162953
Implantable Lead Implant Date: 20100414
Lead Channel Impedance Value: 412.5 Ohm
Lead Channel Pacing Threshold Amplitude: 0.75 V
Lead Channel Pacing Threshold Pulse Width: 0.4 ms
Lead Channel Pacing Threshold Pulse Width: 0.4 ms
Lead Channel Sensing Intrinsic Amplitude: 2.1 mV
Lead Channel Setting Pacing Amplitude: 2.5 V
MDC IDC LEAD IMPLANT DT: 20100414
MDC IDC LEAD LOCATION: 753859
MDC IDC LEAD LOCATION: 753860
MDC IDC MSMT BATTERY REMAINING LONGEVITY: 36 mo
MDC IDC MSMT BATTERY VOLTAGE: 2.83 V
MDC IDC MSMT LEADCHNL RA PACING THRESHOLD AMPLITUDE: 0.75 V
MDC IDC MSMT LEADCHNL RA PACING THRESHOLD PULSEWIDTH: 0.4 ms
MDC IDC MSMT LEADCHNL RA PACING THRESHOLD PULSEWIDTH: 0.4 ms
MDC IDC MSMT LEADCHNL RV IMPEDANCE VALUE: 562.5 Ohm
MDC IDC MSMT LEADCHNL RV PACING THRESHOLD AMPLITUDE: 0.5 V
MDC IDC MSMT LEADCHNL RV PACING THRESHOLD AMPLITUDE: 0.5 V
MDC IDC MSMT LEADCHNL RV SENSING INTR AMPL: 6.9 mV
MDC IDC PG SERIAL: 2272351
MDC IDC SET LEADCHNL RA PACING AMPLITUDE: 2 V
MDC IDC SET LEADCHNL RV PACING PULSEWIDTH: 0.4 ms
MDC IDC SET LEADCHNL RV SENSING SENSITIVITY: 2 mV
MDC IDC STAT BRADY RA PERCENT PACED: 68 %
Pulse Gen Model: 2210

## 2015-09-13 LAB — POCT INR: INR: 3.1

## 2015-09-13 NOTE — Progress Notes (Signed)
Patient Care Team: No Pcp Per Patient as PCP - General (General Practice)   HPI  Frank [name] is a 70 y.o. 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 He has profound first degree heart block and is status post pacemaker implantation. He had struggled with HFpEF    Years ago he  saw Dr. Reine Just who recommended nocturnal oxygen and a right left heart catheter. The patient deferred on both ; with a change in diet he felt considerably better for a period of time.  He saw Amie Portland PA/17 with complaints of increasing dyspnea following a cruise. Weight was up a little bit. She repeated  Echocardiogram>>  showed a well-functioning mitral valve replacement. Severe biatrial enlargement and normal LV function. This was not significantly changed from Echo 2011 He continues to have shortness of breath. This is been gradually worsening over the last year or so. He is on his way to [place] for a number of weeks. He thinks he will feel better in [place].    he is quite stressed. He is looking for work. his wife is also in a stressful situation at work   CPX test done about 2010 demonstrated a VO2 max of 12.2 representing 66.4% predicted. his VO2 adjusted for body weight was 25.9 His slope was 34 and his O2 pulse was 17, 88% of predicted.    Past Medical History  Diagnosis Date  . Hypertrophic cardiomyopathy ([place])     s/p myoectomy  . Diastolic CHF, chronic ([place])   . Atrial arrhythmia     status post ablation in [place] with complication including damage to the aortic valve  . AV block, 1st degree   . Morbid obesity ([place])   . History of melanoma   . HTN (hypertension)   . Atrial fibrillation ([place])   . Bradycardia   . Pacemaker  -SJM   . mitral valve replacement-St. Jude's mechanical   . S/P aortic valve repair     Past Surgical History  Procedure Laterality Date  . Mitral valve replacement      mechanical   . Aortic valve replacement      mechanical-following traumatic injury during an ablation procedure  . Pacemaker insertion      [address]   . Septal myectomy      Current Outpatient Prescriptions  Medication Sig Dispense Refill  . Ascorbic Acid (VITAMIN C) 1000 MG tablet Take 2,000 mg by mouth daily.      Marland Kitchen aspirin 81 MG tablet Take 81 mg by mouth daily.    Marland Kitchen COUMADIN 10 MG tablet TAKE AS DIRECTED PER COUMADIN CLINIC 100 tablet 3  . fish oil-omega-3 fatty acids 1000 MG capsule Take by mouth. 4 capsules daily    . Magnesium Oxide 250 MG TABS Take 1 tablet by mouth 3 (three) times daily.    . Multiple Vitamin (MULTIVITAMIN) tablet Take 1 tablet by mouth daily.      . potassium chloride (K-DUR) 10 MEQ tablet Take 8 tablets (80 meq) by mouth once daily 720 tablet 3  . torsemide (DEMADEX) 20 MG tablet Take 2 tablets (40 mg total) by mouth daily. 180 tablet 3  . valsartan-hydrochlorothiazide (DIOVAN-HCT) 80-12.5 MG tablet TAKE 1 TABLET DAILY 90 tablet 3   No current facility-administered medications for this visit.    No Known Allergies  Review of Systems negative except from HPI and PMH  Physical Exam BP 124/76 mmHg  Pulse 50  Ht 6' (1.829 m)  Wt 363 lb (164.656 kg)  BMI 49.22 kg/m2 Well developed and morbidly obese in no acute distress HENT normal E scleral and icterus clear Neck Supple  Device pocket well healed; without hematoma or erythema .dpClear to ausculation  Regular rate and rhythm, mechanical S1  with early systolic murmur Soft with active bowel sounds No clubbing cyanosis 3+ Edema right greater than left Alert and oriented, grossly normal motor and sensory function Skin Warm and Dry  ECG demonstrates a regular wide-complex rhythm with a right bundle branch block pattern there is right axis deviation. Internal micrograms demonstrated this represents to 1 AV conduction from a slow atrial tachycardia at a rate of 120 beats per minute  Assessment and   Plan  Atrial fibrillation-paroxysmal  Mitral valve replacement-mechanical  Hypertrophic cardiomyopathy status post myectomy  HFpEF  [place] 1 second degree heart block  Mechanical lead failure-noise reversion   A and V   Pacemaker-St. Jude     He remains volume overloaded, but is content with his fluid management. We will allow him to adjust his diuretics as he wants, but suspect he would feel better at about 350 lbs   His LV funtion remains stable;  MVR function normal

## 2015-09-13 NOTE — Patient Instructions (Signed)
Medication Instructions: - Your physician recommends that you continue on your current medications as directed. Please refer to the Current Medication list given to you today.  Labwork: - none  Procedures/Testing: - none  Follow-Up: - Remote monitoring is used to monitor your Pacemaker of ICD from home. This monitoring reduces the number of office visits required to check your device to one time per year. It allows [name] to keep an eye on the functioning of your device to ensure it is working properly. You are scheduled for a device check from home on 12/14/15. You may send your transmission at any time that day. If you have a wireless device, the transmission will be sent automatically. After your physician reviews your transmission, you will receive a postcard with your next transmission date.  - Your physician wants you to follow-up in: 6 months with Dr. [name]. You will receive a reminder letter in the mail two months in advance. If you don't receive a letter, please call our office to schedule the follow-up appointment.  Any Additional Special Instructions Will Be Listed Below (If Applicable).     If you need a refill on your cardiac medications before your next appointment, please call your pharmacy.

## 2015-09-17 ENCOUNTER — Encounter: Payer: Medicare Other | Admitting: *Deleted

## 2015-09-17 ENCOUNTER — Telehealth: Payer: Self-pay | Admitting: Cardiology

## 2015-09-17 NOTE — Telephone Encounter (Signed)
LMOVM reminding pt to send remote transmission.   

## 2015-09-28 ENCOUNTER — Encounter: Payer: Self-pay | Admitting: Internal Medicine

## 2015-10-03 ENCOUNTER — Encounter: Payer: Self-pay | Admitting: Internal Medicine

## 2015-10-04 ENCOUNTER — Other Ambulatory Visit: Payer: Self-pay | Admitting: *Deleted

## 2015-10-04 MED ORDER — POTASSIUM CHLORIDE ER 10 MEQ PO TBCR: EXTENDED_RELEASE_TABLET | ORAL | Status: DC

## 2015-10-26 DIAGNOSIS — Z85828 Personal history of other malignant neoplasm of skin: Secondary | ICD-10-CM | POA: Diagnosis not present

## 2015-10-26 DIAGNOSIS — L708 Other acne: Secondary | ICD-10-CM | POA: Diagnosis not present

## 2015-10-27 ENCOUNTER — Ambulatory Visit (INDEPENDENT_AMBULATORY_CARE_PROVIDER_SITE_OTHER): Payer: Medicare Other | Admitting: Pharmacist

## 2015-10-27 DIAGNOSIS — I4891 Unspecified atrial fibrillation: Secondary | ICD-10-CM

## 2015-10-27 DIAGNOSIS — Z9889 Other specified postprocedural states: Secondary | ICD-10-CM | POA: Diagnosis not present

## 2015-10-27 DIAGNOSIS — G459 Transient cerebral ischemic attack, unspecified: Secondary | ICD-10-CM | POA: Diagnosis not present

## 2015-10-27 DIAGNOSIS — Z5181 Encounter for therapeutic drug level monitoring: Secondary | ICD-10-CM

## 2015-10-27 DIAGNOSIS — I480 Paroxysmal atrial fibrillation: Secondary | ICD-10-CM

## 2015-10-27 LAB — POCT INR: INR: 2.7

## 2015-11-03 ENCOUNTER — Encounter (HOSPITAL_COMMUNITY): Payer: Self-pay

## 2015-11-03 ENCOUNTER — Ambulatory Visit (HOSPITAL_COMMUNITY): Admit: 2015-11-03 | Payer: Medicare Other | Admitting: Gastroenterology

## 2015-11-03 SURGERY — COLONOSCOPY WITH PROPOFOL
Anesthesia: Monitor Anesthesia Care

## 2015-12-06 ENCOUNTER — Ambulatory Visit (INDEPENDENT_AMBULATORY_CARE_PROVIDER_SITE_OTHER): Payer: Medicare Other

## 2015-12-06 DIAGNOSIS — I4891 Unspecified atrial fibrillation: Secondary | ICD-10-CM

## 2015-12-06 DIAGNOSIS — Z5181 Encounter for therapeutic drug level monitoring: Secondary | ICD-10-CM | POA: Diagnosis not present

## 2015-12-06 DIAGNOSIS — Z9889 Other specified postprocedural states: Secondary | ICD-10-CM

## 2015-12-06 DIAGNOSIS — G459 Transient cerebral ischemic attack, unspecified: Secondary | ICD-10-CM | POA: Diagnosis not present

## 2015-12-06 LAB — POCT INR: INR: 2.9

## 2015-12-14 ENCOUNTER — Ambulatory Visit (INDEPENDENT_AMBULATORY_CARE_PROVIDER_SITE_OTHER): Payer: Medicare Other | Admitting: *Deleted

## 2015-12-14 DIAGNOSIS — I441 Atrioventricular block, second degree: Secondary | ICD-10-CM | POA: Diagnosis not present

## 2015-12-14 NOTE — Progress Notes (Signed)
Remote pacemaker transmission.   

## 2015-12-17 ENCOUNTER — Encounter: Payer: Self-pay | Admitting: Cardiology

## 2015-12-22 ENCOUNTER — Telehealth: Payer: Self-pay | Admitting: Internal Medicine

## 2015-12-22 LAB — CUP PACEART REMOTE DEVICE CHECK
Battery Remaining Percentage: 25 %
Brady Statistic AP VP Percent: 73 %
Brady Statistic AP VS Percent: 1 %
Brady Statistic AS VP Percent: 23 %
Brady Statistic RV Percent Paced: 96 %
Date Time Interrogation Session: 20170905065347
Implantable Lead Implant Date: 20100414
Implantable Lead Location: 753859
Lead Channel Impedance Value: 560 Ohm
Lead Channel Pacing Threshold Amplitude: 0.5 V
Lead Channel Pacing Threshold Pulse Width: 0.4 ms
Lead Channel Sensing Intrinsic Amplitude: 7.8 mV
Lead Channel Setting Pacing Amplitude: 2.5 V
Lead Channel Setting Pacing Pulse Width: 0.4 ms
Lead Channel Setting Sensing Sensitivity: 2 mV
MDC IDC LEAD IMPLANT DT: 20100414
MDC IDC LEAD LOCATION: 753860
MDC IDC MSMT BATTERY REMAINING LONGEVITY: 26 mo
MDC IDC MSMT BATTERY VOLTAGE: 2.8 V
MDC IDC MSMT LEADCHNL RA IMPEDANCE VALUE: 400 Ohm
MDC IDC MSMT LEADCHNL RA PACING THRESHOLD AMPLITUDE: 0.75 V
MDC IDC MSMT LEADCHNL RA PACING THRESHOLD PULSEWIDTH: 0.4 ms
MDC IDC MSMT LEADCHNL RA SENSING INTR AMPL: 2.5 mV
MDC IDC SET LEADCHNL RA PACING AMPLITUDE: 2 V
MDC IDC STAT BRADY AS VS PERCENT: 1 %
MDC IDC STAT BRADY RA PERCENT PACED: 74 %
Pulse Gen Model: 2210
Pulse Gen Serial Number: 2272351

## 2015-12-24 ENCOUNTER — Encounter: Payer: Self-pay | Admitting: Internal Medicine

## 2015-12-31 ENCOUNTER — Encounter: Payer: Self-pay | Admitting: Cardiology

## 2016-02-03 ENCOUNTER — Ambulatory Visit (INDEPENDENT_AMBULATORY_CARE_PROVIDER_SITE_OTHER): Payer: Medicare Other | Admitting: *Deleted

## 2016-02-03 DIAGNOSIS — I4891 Unspecified atrial fibrillation: Secondary | ICD-10-CM | POA: Diagnosis not present

## 2016-02-03 DIAGNOSIS — Z5181 Encounter for therapeutic drug level monitoring: Secondary | ICD-10-CM | POA: Diagnosis not present

## 2016-02-03 DIAGNOSIS — Z9889 Other specified postprocedural states: Secondary | ICD-10-CM | POA: Diagnosis not present

## 2016-02-03 DIAGNOSIS — G459 Transient cerebral ischemic attack, unspecified: Secondary | ICD-10-CM | POA: Diagnosis not present

## 2016-02-03 LAB — POCT INR: INR: 3.5

## 2016-02-09 ENCOUNTER — Other Ambulatory Visit: Payer: Self-pay | Admitting: Internal Medicine

## 2016-02-09 DIAGNOSIS — I4891 Unspecified atrial fibrillation: Secondary | ICD-10-CM

## 2016-03-10 ENCOUNTER — Ambulatory Visit (INDEPENDENT_AMBULATORY_CARE_PROVIDER_SITE_OTHER): Payer: Medicare Other | Admitting: Internal Medicine

## 2016-03-10 ENCOUNTER — Telehealth: Payer: Self-pay | Admitting: Internal Medicine

## 2016-03-10 ENCOUNTER — Telehealth: Payer: Self-pay

## 2016-03-10 ENCOUNTER — Ambulatory Visit (INDEPENDENT_AMBULATORY_CARE_PROVIDER_SITE_OTHER): Payer: Medicare Other | Admitting: *Deleted

## 2016-03-10 ENCOUNTER — Telehealth: Payer: Self-pay | Admitting: Pharmacist

## 2016-03-10 ENCOUNTER — Encounter: Payer: Self-pay | Admitting: Internal Medicine

## 2016-03-10 VITALS — BP 136/60 | HR 80 | Ht 70.0 in | Wt 352.2 lb

## 2016-03-10 DIAGNOSIS — G459 Transient cerebral ischemic attack, unspecified: Secondary | ICD-10-CM | POA: Diagnosis not present

## 2016-03-10 DIAGNOSIS — Z954 Presence of other heart-valve replacement: Secondary | ICD-10-CM

## 2016-03-10 DIAGNOSIS — I4891 Unspecified atrial fibrillation: Secondary | ICD-10-CM

## 2016-03-10 DIAGNOSIS — Z5181 Encounter for therapeutic drug level monitoring: Secondary | ICD-10-CM | POA: Diagnosis not present

## 2016-03-10 DIAGNOSIS — K649 Unspecified hemorrhoids: Secondary | ICD-10-CM | POA: Diagnosis not present

## 2016-03-10 DIAGNOSIS — Z86010 Personal history of colonic polyps: Secondary | ICD-10-CM | POA: Diagnosis not present

## 2016-03-10 DIAGNOSIS — Z7901 Long term (current) use of anticoagulants: Secondary | ICD-10-CM

## 2016-03-10 DIAGNOSIS — I480 Paroxysmal atrial fibrillation: Secondary | ICD-10-CM

## 2016-03-10 DIAGNOSIS — Z9889 Other specified postprocedural states: Secondary | ICD-10-CM | POA: Diagnosis not present

## 2016-03-10 LAB — POCT INR: INR: 3.2

## 2016-03-10 NOTE — Telephone Encounter (Signed)
Letter faxed to attention Megan.

## 2016-03-10 NOTE — Telephone Encounter (Signed)
Received fax from [facility] that ppt is scheduled for an endoscopy on 04/13/16. Needs to come off Coumadin for 5 days. Will need bridging given mechanical AVR with afib plus history of TIA. Has used Lovenox in the past but complained about bruising and wanted Arixtra instead. Cannot use Arixtra pre colonoscopy due to half life of 72 hours. Will plan for Lovenox pre procedure and Arixtra post procedure bridging as pt has done earlier this year. Clearance faxed to [facility] [phone].

## 2016-03-10 NOTE — Telephone Encounter (Signed)
Patient contacted that the coumadin clinic is going to work with him to bridge him while holding his coumadin for 5 days prior to his colonoscopy and rx arixtra after.  He has appointment with the coumadin clinic Dec 11th 2017 to discuss all this, patient aware.  Fuller Canada , Pharm D sent [name] a fax back on all this, will send down to be scanned in.

## 2016-03-10 NOTE — Telephone Encounter (Signed)
[facility] [address] [place] [address]  03/10/2016   RE: BURRIS MATHERNE DOB: 1945-09-06 MRN: [id number]   Dear Dr. [name],    We have scheduled the above patient for an endoscopic procedure. Our records show that he is on anticoagulation therapy. He will need bridging with lovenox and arixtra as well.   Please advise as to how long the patient may come off his therapy of Coumadin prior to the procedure, which is scheduled for 04/13/16.  Please fax back/ or route the completed form to [facility] [place] at  [phone].   Sincerely,    [place], Pawan Knechtel     Letter faxed to ATT: Megan at the coumadin clinic, fax # [phone].

## 2016-03-10 NOTE — Progress Notes (Signed)
Frank [name] 70 y.o. [birth year] [id number] Self-referred patient Assessment & Plan:   Encounter Diagnoses  Name Primary?  Marland Kitchen Hx of adenomatous colonic polyps Yes  . Long term current use of anticoagulant   . S/P mitral valve replacement with metallic valve   . Bleeding hemorrhoids   . Paroxysmal atrial fibrillation (HCC)    Repeat colonoscopy is appropriate. He needs polyp surveillance in fact he is overdue. Situation is complicated by mitral valve replacement and history of atrial fibrillation. He has been through this before, we will work with anticoagulation clinic in cardiology, bridge with Lovenox and afterwards with Arixtra as in the past. He understands the typical risks of colonoscopy and polypectomy did include bleeding perforation internal organ damage and other risks as well, and also the increased risk of stroke off of anticoagulation though we are trying to minimize that time off by using a bridge.  I appreciate the opportunity to care for this patient.   Subjective:   Chief Complaint: hx colon polyps  HPI Patient is a very nice elderly man here by himself, because of a history of colon polyps over the years as well as a family history of colon cancer in his father, overdue for colonoscopy. He was scheduled earlier this year and perhaps last year but had to reschedule several times because of issues that came out so he was discharged from his previous practice. His most recent colonoscopy was 11/18/2009, demonstrating in a descending tubular adenoma and a tubulovillous adenoma of the transverse and sigmoid colon, maximum size was the transverse polyp at 2 cm and there was a 1.5 cm pedunculated/sessile polyp in the sigmoid. He has had multiple colonoscopies over the years, saying that Dr. Velora Heckler performed some originally before Dr. Michail Sermon was performing them. He reports that in general his bowel habits are regular, he does think he probably has some irritable bowel  issues at times, twice in the past year he has seen some bright red blood per rectum from known hemorrhoids.  His GI review of systems is otherwise negative. 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  He has been retired, but he is planning on most likely taking a job in [place] for a year, coming up starting in 2018.  He has been losing weight, intentionally but realizes he needs to lose more. He recently began to exercise some. His atrial fibrillation and heart conditions are seen to be stable at this time. No Known Allergies Outpatient Medications Prior to Visit  Medication Sig Dispense Refill  . Ascorbic Acid (VITAMIN C) 1000 MG tablet Take 2,000 mg by mouth daily.      Marland Kitchen aspirin 81 MG tablet Take 81 mg by mouth daily.    Marland Kitchen COUMADIN 10 MG tablet TAKE AS DIRECTED PER COUMADIN CLINIC 100 tablet 1  . fish oil-omega-3 fatty acids 1000 MG capsule Take by mouth. Take 4 capsules by mouth once daily    . KLOR-CON 10 10 MEQ tablet TAKE 8 TABLETS BY MOUTH ONCE DAILY 720 tablet 1  . Magnesium Oxide 250 MG TABS Take 1 tablet by mouth 3 (three) times daily.    . Multiple Vitamin (MULTIVITAMIN) tablet Take 1 tablet by mouth daily.      Marland Kitchen torsemide (DEMADEX) 20 MG tablet TAKE TWO TABLETS BY MOUTH ONCE DAILY 180 tablet 1  . valsartan-hydrochlorothiazide (DIOVAN-HCT) 80-12.5 MG tablet TAKE ONE TABLET BY MOUTH ONCE DAILY 90 tablet 1  No facility-administered medications prior to visit.    Past Medical History:  Diagnosis Date  . Anxiety   . Atrial arrhythmia    status post ablation in [place] with complication including damage to the aortic valve  . Atrial fibrillation ([place])   . AV block, 1st degree   . Bradycardia   . Diastolic CHF, chronic ([place])   . Diverticulosis   . History of melanoma   . HTN (hypertension)   . Hypertrophic cardiomyopathy ([place])    s/p myoectomy  . Internal hemorrhoids   . mitral valve  replacement-St. Jude's mechanical   . Morbid obesity ([place])   . Pacemaker  -SJM   . S/P aortic valve repair   . TIA (transient ischemic attack) 2001  . Tubulovillous adenoma    Past Surgical History:  Procedure Laterality Date  . AORTIC VALVE REPLACEMENT     mechanical-following traumatic injury during an ablation procedure  . COLONOSCOPY    . MITRAL VALVE REPLACEMENT     mechanical  . PACEMAKER INSERTION     St Jude Accent   . septal myectomy     Social History   Social History  . Marital status: Married    Spouse name: N/A  . Number of children: 1  . Years of education: N/A   Occupational History  . quality engineer [employer]   Social History Main Topics  . Smoking status: Former Smoker    Types: Cigarettes    Quit date: 04/10/1977  . Smokeless tobacco: Never Used  . Alcohol use Yes     Comment: occasional  . Drug use: No  . Sexual activity: Not Asked   Other Topics Concern  . None   Social History Narrative   Married, one adopted son and one daughter. 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   2 caffeinated beverages daily   Family History  Problem Relation Age of Onset  . Heart disease Sister   . Uterine cancer Sister   . Obesity Sister     500+ lbs  . Heart disease Brother   . Heart disease Sister   . Heart disease Father   . Colon cancer Father 71  . Crohn's disease Paternal Grandmother    Review of Systems As above, chronic night sweats chronic dyspnea chronic pedal edema right greater than left lower extremity anxiety and fatigue. All other review of systems are negative.  Objective:   Physical Exam @BP  136/60 (BP Location: Left Arm, Patient Position: Sitting, Cuff Size: Large)   Pulse 80   Ht 5\' 10"  (1.778 m) Comment: height measured without shoes  Wt (!) 352 lb 4 oz (159.8 kg)   BMI 50.54 kg/m @  General:  Well-developed, well-nourished and in no acute distress Eyes:  anicteric. ENT:      Mouth and posterior pharynx free of lesions.  Neck:   supple w/o thyromegaly or mass.  Lungs: Clear to auscultation bilaterally. Heart:   Metallic S1 NL S2, no rubs, murmurs, gallops. Abdomen:  obese soft, non-tender, no hepatosplenomegaly, hernia, or mass and BS+.  Rectal: deferred Extremities:   2+ RLE 1+ LLE edema, cyanosis or clubbing Skin   no rash. Neuro:  A&O x 3.  Psych:  appropriate mood and  Affect.   Data Reviewed: See history of present illness. I've also reviewed recent cardiology notes in 2017 and anticoagulation clinic notes that her in the EMR. CBC was normal in April 2017.

## 2016-03-10 NOTE — Patient Instructions (Addendum)
   You have been scheduled for a colonoscopy. Please follow written instructions given to you at your visit today.  Please pick up your prep supplies at the pharmacy .If you use inhalers (even only as needed), please bring them with you on the day of your procedure.  You will be contaced by our office prior to your procedure for directions on holding your Coumadin/Warfarin.  If you do not hear from our office 1 week prior to your scheduled procedure, please call [phone] to discuss. We know you will need a bridge as you have done in the past.  Letter faxed to Att: Megan at coumadin clinic, fax # [phone]    I appreciate the opportunity to care for you. Silvano Rusk, MD, [facility]

## 2016-03-20 ENCOUNTER — Ambulatory Visit (INDEPENDENT_AMBULATORY_CARE_PROVIDER_SITE_OTHER): Payer: Medicare Other | Admitting: *Deleted

## 2016-03-20 DIAGNOSIS — Z5181 Encounter for therapeutic drug level monitoring: Secondary | ICD-10-CM | POA: Diagnosis not present

## 2016-03-20 DIAGNOSIS — Z9889 Other specified postprocedural states: Secondary | ICD-10-CM

## 2016-03-20 DIAGNOSIS — I4891 Unspecified atrial fibrillation: Secondary | ICD-10-CM | POA: Diagnosis not present

## 2016-03-20 DIAGNOSIS — G459 Transient cerebral ischemic attack, unspecified: Secondary | ICD-10-CM

## 2016-03-20 LAB — POCT INR: INR: 2.8

## 2016-03-20 MED ORDER — FONDAPARINUX SODIUM 10 MG/0.8ML ~~LOC~~ SOLN: 10.0000 mg | SUBCUTANEOUS | 0 refills | Status: DC

## 2016-03-20 MED ORDER — ENOXAPARIN SODIUM 150 MG/ML ~~LOC~~ SOLN: 150.0000 mg | Freq: Two times a day (BID) | SUBCUTANEOUS | 0 refills | Status: DC

## 2016-03-20 NOTE — Patient Instructions (Addendum)
04/07/16: Last dose of Coumadin.  04/08/16: No Coumadin or Lovenox.  04/09/16: Inject Lovenox 150mg  in the fatty abdominal tissue at least 2 inches from the belly button twice a day about 12 hours apart, 8am and 8pm rotate sites. No Coumadin.  04/10/16: Inject Lovenox 150mg  in the fatty tissue every 12 hours, 8am and 8pm. No Coumadin.  04/11/16: Inject Lovenox 150mg  in the fatty tissue every 12 hours, 8am and 8pm. No Coumadin.  04/12/16: Inject Lovenox 150mg  in the fatty tissue in the morning at 8 am (No PM dose). No Coumadin.  04/13/16: Procedure Day - No Lovenox - Resume Coumadin in the evening or as directed by doctor (take an extra half tablet with usual dose for 2 days then resume normal dose).  04/14/16: Start Arixtra 10mg  subcutaneous injection ONCE daily at 8am and take Coumadin.  04/15/16: Inject Arixtra 10mg  subcutaneously at 8am and take Coumadin.  04/16/16: Inject Arixtra 10mg  subcutaneously at 8am and take Coumadin.  04/17/16: Inject Arixtra 10mg  subcutaneously at 8am and take Coumadin.  04/18/16: Inject Arixtra 10mg  subcutaneously at 8am and take Coumadin.  04/19/16: Coumadin appt to check INR.

## 2016-03-30 ENCOUNTER — Telehealth: Payer: Self-pay | Admitting: Internal Medicine

## 2016-03-30 NOTE — Telephone Encounter (Signed)
I advised the pt to call his PCP or pulmonologist and make them aware of his O2 level.  He should be evaluated by them and call if they think he has any issues that would prevent him from having any procedures

## 2016-03-31 NOTE — Progress Notes (Signed)
Attempted to send fax out to [facility] at [phone], but was unsuccessful.  Attempted to make contact with Clinic representative, but no one was available at this time.  Will continue to try.

## 2016-04-06 ENCOUNTER — Telehealth: Payer: Self-pay | Admitting: Internal Medicine

## 2016-04-06 DIAGNOSIS — R7981 Abnormal blood-gas level: Secondary | ICD-10-CM

## 2016-04-06 NOTE — Telephone Encounter (Signed)
New Message:  Pt's oxygen level have been dropping on some days. His concern is,next week he is having a Colonoscopy.Will this be a problem?

## 2016-04-06 NOTE — Telephone Encounter (Signed)
Pt states that he was told to call our office for recommendations from Dr. [name] due to low O2 levels.  Pt denies having a PCP or Pulmonologist.  Pt's O2 in the last week has been between 86-89%.  Today reading is 92%.  Denies dizziness, lightheadedness or other symptoms.  Pt has only had some weakness when O2 level was at 86%.  Pt states breathing is fine and he doesn't have any episodes where he feels like he can't get a breath.  Pt is to have a colonoscopy next week and wanted to see if Dr. [name] felt that it would be ok to proceed with this as he is having low O2 readings? Will route to Dr. [name] for review and advisement.

## 2016-04-11 ENCOUNTER — Telehealth: Payer: Self-pay | Admitting: Internal Medicine

## 2016-04-11 ENCOUNTER — Encounter (HOSPITAL_COMMUNITY): Payer: Self-pay | Admitting: *Deleted

## 2016-04-11 ENCOUNTER — Telehealth: Payer: Self-pay | Admitting: Pharmacist

## 2016-04-11 NOTE — Telephone Encounter (Signed)
I do not see this as a problem

## 2016-04-11 NOTE — Telephone Encounter (Signed)
I am sure taht they will be monitoring him for his colonscopy, but he should alert them as they may prefer to do the procedure in the hospital  His low Oxygen is a concern however as in conjuncdtion with weight, the stress on the lungs  And heart is a concern We would be glad to refer him to pulm for further evaluation

## 2016-04-11 NOTE — Telephone Encounter (Signed)
Spoke with pt and reviewed information provided by Dr. [name].  Pt agreeable to see Pulmonology.  Referral placed.

## 2016-04-11 NOTE — Telephone Encounter (Signed)
Pt called to report that he took his last dose of warfarin on Saturday rather than Friday as directed. Since the office was closed he deferred initiation of lovenox to Monday. His concern today is his whether his INR will be low enough for procedure Thursday. Instructed him that likely will be low enough since he will still have missed 4 doses, but will route note to provider doing procedure as FYI. If needed we are able to do INR in office Wednesday afternoon.   Pt states understanding and appreciation for help.

## 2016-04-11 NOTE — Telephone Encounter (Signed)
Prep instructions left up front for pt to pick up.

## 2016-04-12 ENCOUNTER — Other Ambulatory Visit: Payer: Self-pay | Admitting: Pharmacist

## 2016-04-12 ENCOUNTER — Telehealth: Payer: Self-pay | Admitting: Pharmacist

## 2016-04-12 MED ORDER — ENOXAPARIN SODIUM 150 MG/ML ~~LOC~~ SOLN: 150.0000 mg | Freq: Two times a day (BID) | SUBCUTANEOUS | 0 refills | Status: DC

## 2016-04-12 NOTE — Telephone Encounter (Signed)
Pt called stating that his Arixtra will be >$500 because of his deductible at the beginning of the year. Sent in rx for Lovenox to see if this is cheaper. Pt will call back and let us know.

## 2016-04-12 NOTE — Telephone Encounter (Signed)
Pt called to report that though he does not want to he will stay on lovenox post procedure because the copay is much more affordable. Dosing instructions given (inject lovenox BID 8 am and 8pm starting day after procedure 04/14/16). He requests to be checked on Monday 04/17/16 in hopes that he will be able to come off lovenox sooner. He states that he will only do this if we can guarantee he will be able to be checked again on Wednesday as he wants to be on lovenox for the shortest time possible. Appt made for Monday 04/17/16. Advised he take his morning dose of lovenox and we can discontinue based on INR result in office that day. Have not cancelled his appt for Wednesday as he is adamant that we check him again that day if INR not therapeutic on Monday.   He states he understands dosing instructions and knows to call if any further questions or concerns.

## 2016-04-13 ENCOUNTER — Encounter (HOSPITAL_COMMUNITY)
Admission: RE | Disposition: A | Discharge status: Home / Self Care | Payer: Self-pay | Source: Ambulatory Visit | Attending: Internal Medicine

## 2016-04-13 ENCOUNTER — Ambulatory Visit (HOSPITAL_COMMUNITY): Payer: Medicare Other | Admitting: Anesthesiology

## 2016-04-13 ENCOUNTER — Ambulatory Visit (HOSPITAL_COMMUNITY)
Admission: RE | Admit: 2016-04-13 | Discharge: 2016-04-13 | Disposition: A | Discharge status: Home / Self Care | Payer: Medicare Other | Source: Ambulatory Visit | Attending: Internal Medicine | Admitting: Internal Medicine

## 2016-04-13 ENCOUNTER — Encounter (HOSPITAL_COMMUNITY): Payer: Self-pay | Admitting: *Deleted

## 2016-04-13 DIAGNOSIS — Z86010 Personal history of colonic polyps: Secondary | ICD-10-CM | POA: Insufficient documentation

## 2016-04-13 DIAGNOSIS — D122 Benign neoplasm of ascending colon: Secondary | ICD-10-CM | POA: Diagnosis not present

## 2016-04-13 DIAGNOSIS — I4891 Unspecified atrial fibrillation: Secondary | ICD-10-CM | POA: Insufficient documentation

## 2016-04-13 DIAGNOSIS — Z87891 Personal history of nicotine dependence: Secondary | ICD-10-CM | POA: Insufficient documentation

## 2016-04-13 DIAGNOSIS — D125 Benign neoplasm of sigmoid colon: Secondary | ICD-10-CM

## 2016-04-13 DIAGNOSIS — Z85820 Personal history of malignant melanoma of skin: Secondary | ICD-10-CM | POA: Diagnosis not present

## 2016-04-13 DIAGNOSIS — I5032 Chronic diastolic (congestive) heart failure: Secondary | ICD-10-CM | POA: Diagnosis not present

## 2016-04-13 DIAGNOSIS — I422 Other hypertrophic cardiomyopathy: Secondary | ICD-10-CM | POA: Insufficient documentation

## 2016-04-13 DIAGNOSIS — K5730 Diverticulosis of large intestine without perforation or abscess without bleeding: Secondary | ICD-10-CM | POA: Diagnosis not present

## 2016-04-13 DIAGNOSIS — D126 Benign neoplasm of colon, unspecified: Secondary | ICD-10-CM | POA: Diagnosis not present

## 2016-04-13 DIAGNOSIS — Z950 Presence of cardiac pacemaker: Secondary | ICD-10-CM | POA: Diagnosis not present

## 2016-04-13 DIAGNOSIS — Z6841 Body Mass Index (BMI) 40.0 and over, adult: Secondary | ICD-10-CM | POA: Insufficient documentation

## 2016-04-13 DIAGNOSIS — Z952 Presence of prosthetic heart valve: Secondary | ICD-10-CM | POA: Diagnosis not present

## 2016-04-13 DIAGNOSIS — Z1211 Encounter for screening for malignant neoplasm of colon: Secondary | ICD-10-CM | POA: Insufficient documentation

## 2016-04-13 DIAGNOSIS — D120 Benign neoplasm of cecum: Secondary | ICD-10-CM

## 2016-04-13 DIAGNOSIS — I110 Hypertensive heart disease with heart failure: Secondary | ICD-10-CM | POA: Diagnosis not present

## 2016-04-13 DIAGNOSIS — D123 Benign neoplasm of transverse colon: Secondary | ICD-10-CM | POA: Diagnosis not present

## 2016-04-13 HISTORY — PX: COLONOSCOPY WITH PROPOFOL: SHX5780

## 2016-04-13 LAB — PROTIME-INR
INR: 1.19
Prothrombin Time: 15.2 seconds (ref 11.4–15.2)

## 2016-04-13 SURGERY — COLONOSCOPY WITH PROPOFOL
Anesthesia: Monitor Anesthesia Care

## 2016-04-13 MED ORDER — LACTATED RINGERS IV SOLN
INTRAVENOUS | Status: DC
  Administered 2016-04-13: 08:00:00 via INTRAVENOUS

## 2016-04-13 MED ORDER — PROPOFOL 10 MG/ML IV BOLUS
INTRAVENOUS | Status: AC
  Filled 2016-04-13: qty 40

## 2016-04-13 MED ORDER — PROPOFOL 10 MG/ML IV BOLUS
INTRAVENOUS | Status: AC
  Filled 2016-04-13: qty 20

## 2016-04-13 MED ORDER — SODIUM CHLORIDE 0.9 % IV SOLN: INTRAVENOUS | Status: DC

## 2016-04-13 MED ORDER — PROPOFOL 10 MG/ML IV BOLUS
INTRAVENOUS | Status: DC | PRN
  Administered 2016-04-13 (×5): 20 mg via INTRAVENOUS
  Administered 2016-04-13 (×2): 30 mg via INTRAVENOUS
  Administered 2016-04-13 (×2): 20 mg via INTRAVENOUS
  Administered 2016-04-13 (×2): 30 mg via INTRAVENOUS
  Administered 2016-04-13 (×2): 20 mg via INTRAVENOUS
  Administered 2016-04-13: 30 mg via INTRAVENOUS
  Administered 2016-04-13 (×6): 20 mg via INTRAVENOUS
  Administered 2016-04-13: 30 mg via INTRAVENOUS
  Administered 2016-04-13: 20 mg via INTRAVENOUS

## 2016-04-13 SURGICAL SUPPLY — 22 items

## 2016-04-13 NOTE — Anesthesia Postprocedure Evaluation (Signed)
Anesthesia Post Note  Patient: Frank [name]  Procedure(s) Performed: Procedure(s) (LRB): COLONOSCOPY WITH PROPOFOL (N/A)  Patient location during evaluation: PACU Anesthesia Type: MAC Level of consciousness: awake and alert Pain management: pain level controlled Vital Signs Assessment: post-procedure vital signs reviewed and stable Respiratory status: spontaneous breathing, nonlabored ventilation, respiratory function stable and patient connected to nasal cannula oxygen Cardiovascular status: stable and blood pressure returned to baseline Anesthetic complications: no        Last Vitals:  Vitals:   04/13/16 0940 04/13/16 0950  BP: (!) 142/64 140/75  Pulse: (!) 59 (!) 58  Resp: 15 20  Temp:      Last Pain:  Vitals:   04/13/16 0921  TempSrc: Oral   Pain Goal:                 Riccardo Dubin

## 2016-04-13 NOTE — Transfer of Care (Signed)
Immediate Anesthesia Transfer of Care Note  Patient: Frank [name]  Procedure(s) Performed: Procedure(s): COLONOSCOPY WITH PROPOFOL (N/A)  Patient Location: PACU  Anesthesia Type:MAC  Level of Consciousness: sedated  Airway & Oxygen Therapy: Patient Spontanous Breathing and Patient connected to nasal cannula oxygen  Post-op Assessment: Report given to RN and Post -op Vital signs reviewed and stable  Post vital signs: Reviewed and stable  Last Vitals:  Vitals:   04/13/16 0815  BP: (!) 152/61  Pulse: 64  Resp: (!) 24  Temp: 36.5 C    Last Pain:  Vitals:   04/13/16 0815  TempSrc: Oral         Complications: No apparent anesthesia complications

## 2016-04-13 NOTE — Anesthesia Preprocedure Evaluation (Signed)
Anesthesia Evaluation  Patient identified by MRN, date of birth, ID band Patient awake    Reviewed: Allergy & Precautions, H&P , Patient's Chart, lab work & pertinent test results, reviewed documented beta blocker date and time   Airway Mallampati: II  TM Distance: >3 FB Neck ROM: full    Dental no notable dental hx.    Pulmonary former smoker,    Pulmonary exam normal breath sounds clear to auscultation       Cardiovascular hypertension,  Rhythm:regular Rate:Normal     Neuro/Psych    GI/Hepatic   Endo/Other    Renal/GU      Musculoskeletal   Abdominal   Peds  Hematology   Anesthesia Other Findings Hypertrophic cardiomyopathy ([place])   s/p myoectomy [place]  Diastolic CHF, chronic (HCC)   Atrial arrhythmia status post ablation iwith complication including AV block, 1st degree   Morbid obesity (HCC)    History of melanoma   HTN (hypertension)    Atrial fibrillation (HCC)   Bradycardia    Pacemaker -SJM   mitral valve replacement-St. Jude's mechanical  S/P aortic valve repair      Reproductive/Obstetrics                             Anesthesia Physical Anesthesia Plan  ASA: III  Anesthesia Plan: MAC   Post-op Pain Management:    Induction: Intravenous  Airway Management Planned: Mask and Natural Airway  Additional Equipment:   Intra-op Plan:   Post-operative Plan:   Informed Consent: I have reviewed the patients History and Physical, chart, labs and discussed the procedure including the risks, benefits and alternatives for the proposed anesthesia with the patient or authorized representative who has indicated his/her understanding and acceptance.   Dental Advisory Given  Plan Discussed with: CRNA and Surgeon  Anesthesia Plan Comments: (Discussed sedation and potential to need to place airway or ETT if warranted by clinical changes intra-operatively. We will start procedure  as MAC.)        Anesthesia Quick Evaluation

## 2016-04-13 NOTE — Discharge Instructions (Signed)
° °  I removed 4 small polyps today.  You also have a condition called diverticulosis - common and not usually a problem. Please read the handout provided.  I will let you know pathology results and when to have another routine colonoscopy by mail.  Please resume anticoagulation as told by coumadin clinic. OK to start warfarin tonight then Lovenox tomorrow.  I appreciate the opportunity to care for you. Gatha Mayer, MD, FACG   YOU HAD AN ENDOSCOPIC PROCEDURE TODAY: Refer to the procedure report and other information in the discharge instructions given to you for any specific questions about what was found during the examination. If this information does not answer your questions, please call Dr. Celesta Aver office at [phone] to clarify.   YOU SHOULD EXPECT: Some feelings of bloating in the abdomen. Passage of more gas than usual. Walking can help get rid of the air that was put into your GI tract during the procedure and reduce the bloating. If you had a lower endoscopy (such as a colonoscopy or flexible sigmoidoscopy) you may notice spotting of blood in your stool or on the toilet paper. Some abdominal soreness may be present for a day or two, also.  DIET: Your first meal following the procedure should be a light meal and then it is ok to progress to your normal diet. A half-sandwich or bowl of soup is an example of a good first meal. Heavy or fried foods are harder to digest and may make you feel nauseous or bloated. Drink plenty of fluids but you should avoid alcoholic beverages for 24 hours.   ACTIVITY: Your care partner should take you home directly after the procedure. You should plan to take it easy, moving slowly for the rest of the day. You can resume normal activity the day after the procedure however YOU SHOULD NOT DRIVE, use power tools, machinery or perform tasks that involve climbing or major physical exertion for 24 hours (because of the sedation medicines used during the  test).   SYMPTOMS TO REPORT IMMEDIATELY: A gastroenterologist can be reached at any hour. Please call [phone]  for any of the following symptoms:  Following lower endoscopy (colonoscopy, flexible sigmoidoscopy) Excessive amounts of blood in the stool  Significant tenderness, worsening of abdominal pains  Swelling of the abdomen that is new, acute  Fever of 100 or higher  Following upper endoscopy (EGD, EUS, ERCP, esophageal dilation) Vomiting of blood or coffee ground material  New, significant abdominal pain  New, significant chest pain or pain under the shoulder blades  Painful or persistently difficult swallowing  New shortness of breath  Black, tarry-looking or red, bloody stools  FOLLOW UP:  If any biopsies were taken you will be contacted by phone or by letter within the next 1-3 weeks. Call [phone]  if you have not heard about the biopsies in 3 weeks.  Please also call with any specific questions about appointments or follow up tests.

## 2016-04-13 NOTE — Op Note (Signed)
[facility] Patient Name: Frank [name] Procedure Date: 04/13/2016 MRN: [id number] Attending MD: Gatha Mayer , MD Date of Birth: Jul 19, 1945 CSN: [id number] Age: 71 Admit Type: Outpatient Procedure:                Colonoscopy Indications:              Surveillance: Personal history of adenomatous                            polyps on last colonoscopy > 5 years ago Providers:                Gatha Mayer, MD, Elmer Ramp. Tilden Dome, RN, [address], Technician, Kaycee Alday CRNA, CRNA Referring MD:              Medicines:                Propofol per Anesthesia, Monitored Anesthesia Care Complications:            No immediate complications. Estimated Blood Loss:     Estimated blood loss was minimal. Procedure:                Pre-Anesthesia Assessment:                           - Prior to the procedure, a History and Physical                            was performed, and patient medications and                            allergies were reviewed. The patient's tolerance of                            previous anesthesia was also reviewed. The risks                            and benefits of the procedure and the sedation                            options and risks were discussed with the patient.                            All questions were answered, and informed consent                            was obtained. Prior Anticoagulants: The patient                            last took Coumadin (warfarin) 4 days and Lovenox                            (enoxaparin) 1 day prior to the procedure. ASA  Grade Assessment: III - A patient with severe                            systemic disease. After reviewing the risks and                            benefits, the patient was deemed in satisfactory                            condition to undergo the procedure.                           After obtaining informed consent, the  colonoscope                            was passed under direct vision. Throughout the                            procedure, the patient's blood pressure, pulse, and                            oxygen saturations were monitored continuously. The                            EC-[id number] ([id number]) scope was introduced through                            the anus and advanced to the the cecum, identified                            by appendiceal orifice and ileocecal valve. The                            colonoscopy was performed without difficulty. The                            patient tolerated the procedure well. The quality                            of the bowel preparation was adequate. The bowel                            preparation used was Miralax. The ileocecal valve,                            appendiceal orifice, and rectum were photographed. Scope In: 8:50:47 AM Scope Out: 9:16:36 AM Scope Withdrawal Time: 0 hours 20 minutes 46 seconds  Total Procedure Duration: 0 hours 25 minutes 49 seconds  Findings:      The perianal and digital rectal examinations were normal.      Four sessile polyps were found in the sigmoid colon, transverse colon,       ascending colon and cecum. The polyps were 4 to 8 mm in size. These       polyps were  removed with a cold snare. Resection and retrieval were       complete. Estimated blood loss was minimal.      Multiple small and large-mouthed diverticula were found in the sigmoid       colon.      The exam was otherwise without abnormality on direct and retroflexion       views. Impression:               - Four 4 to 8 mm polyps in the sigmoid colon, in                            the transverse colon, in the ascending colon and in                            the cecum, removed with a cold snare. Resected and                            retrieved.                           - Diverticulosis in the sigmoid colon.                           - The examination  was otherwise normal on direct                            and retroflexion views.                           - Personal history of colonic polyps. Moderate Sedation:      N/A- Per Anesthesia Care Recommendation:           - Patient has a contact number available for                            emergencies. The signs and symptoms of potential                            delayed complications were discussed with the                            patient. Return to normal activities tomorrow.                            Written discharge instructions were provided to the                            patient.                           - Resume previous diet.                           - Continue present medications.                           -  Resume Coumadin (warfarin) today and Lovenox                            (enoxaparin) today at prior doses. Refer to                            Coumadin Clinic for further adjustment of therapy.                           - Follow anticoagulation recommendations as per                            anti-coag clinic - may be using Arixtra vs Lovenox                            + resuming warfarin                           - Repeat colonoscopy is recommended for                            surveillance. The colonoscopy date will be                            determined after pathology results from today's                            exam become available for review. Procedure Code(s):        --- Professional ---                           [phone], Colonoscopy, flexible; with removal of                            tumor(s), polyp(s), or other lesion(s) by snare                            technique Diagnosis Code(s):        --- Professional ---                           Z86.010, Personal history of colonic polyps                           D12.5, Benign neoplasm of sigmoid colon                           D12.3, Benign neoplasm of transverse colon (hepatic                             flexure or splenic flexure)                           D12.2, Benign neoplasm of ascending colon  D12.0, Benign neoplasm of cecum                           K57.30, Diverticulosis of large intestine without                            perforation or abscess without bleeding CPT copyright 2016 American Medical Association. All rights reserved. The codes documented in this report are preliminary and upon coder review may  be revised to meet current compliance requirements. Gatha Mayer, MD 04/13/2016 9:40:18 AM This report has been signed electronically. Number of Addenda: 0

## 2016-04-13 NOTE — H&P (Signed)
[facility] History and Physical   Primary Care Physician:  No PCP Per Patient   Reason for Procedure:   hx of colon polyps  Plan:    Colonoscopy - The risks and benefits as well as alternatives of endoscopic procedure(s) have been discussed and reviewed. All questions answered. The patient agrees to proceed.   HPI: Frank [name] is a 71 y.o. male seen 12/1 for hx polyps   Patient is a very nice elderly man here by himself, because of a history of colon polyps over the years as well as a family history of colon cancer in his father, overdue for colonoscopy. He was scheduled earlier this year and perhaps last year but had to reschedule several times because of issues that came out so he was discharged from his previous practice. His most recent colonoscopy was 11/18/2009, demonstrating in a descending tubular adenoma and a tubulovillous adenoma of the transverse and sigmoid colon, maximum size was the transverse polyp at 2 cm and there was a 1.5 cm pedunculated/sessile polyp in the sigmoid. He has had multiple colonoscopies over the years, saying that Dr. Velora Heckler performed some originally before Dr. Michail Sermon was performing them. He reports that in general his bowel habits are regular, he does think he probably has some irritable bowel issues at times, twice in the past year he has seen some bright red blood per rectum from known hemorrhoids.  His GI review of systems is otherwise negative. 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  He has been retired, but he is planning on most likely taking a job in [place] for a year, coming up starting in 2018.  He has been losing weight, intentionally but realizes he needs to lose more. He recently began to exercise some. His atrial fibrillation and heart conditions are seen to be stable at this time.  Past Medical History:  Diagnosis Date  . Anxiety   . Atrial  arrhythmia    status post ablation in [place] with complication including damage to the aortic valve  . Atrial fibrillation ([place])   . AV block, 1st degree   . Bradycardia   . Diastolic CHF, chronic ([place])   . Diverticulosis   . History of melanoma   . HTN (hypertension)   . Hypertrophic cardiomyopathy ([place])    s/p myoectomy  . Internal hemorrhoids   . mitral valve replacement-St. Jude's mechanical   . Morbid obesity ([place])   . Pacemaker  -SJM   . S/P aortic valve repair   . TIA (transient ischemic attack) 2001  . Tubulovillous adenoma     Past Surgical History:  Procedure Laterality Date  . AORTIC VALVE REPLACEMENT     mechanical-following traumatic injury during an ablation procedure  . COLONOSCOPY    . MITRAL VALVE REPLACEMENT     mechanical  . PACEMAKER INSERTION     St Jude Accent   . septal myectomy      Prior to Admission medications   Medication Sig Start Date End Date Taking? Authorizing Provider  Ascorbic Acid (VITAMIN C) 1000 MG tablet Take 2,000 mg by mouth daily.     Yes Historical Provider, MD  aspirin 81 MG tablet Take 81 mg by mouth daily.   Yes Historical Provider, MD  COUMADIN 10 MG tablet TAKE AS DIRECTED PER COUMADIN CLINIC Patient taking differently: Take 10mg  by mouth daily except on wednesdays take 15mg . 02/09/16  Yes [name], MD  enoxaparin (LOVENOX) 150 MG/ML injection Inject 1 mL (150 mg total) into the skin every 12 (twelve) hours. Lovenox prior to procedure then after procedure pt will start Arixtra 03/20/16  Yes [name], MD  fish oil-omega-3 fatty acids 1000 MG capsule Take by mouth. Take 4 capsules by mouth once daily   Yes Historical Provider, MD  KLOR-CON 10 10 MEQ tablet TAKE 8 TABLETS BY MOUTH ONCE DAILY 02/09/16  Yes [name], MD  Magnesium Oxide 250 MG TABS Take 1 tablet by mouth 3 (three) times daily.   Yes Historical Provider, MD  Multiple Vitamin (MULTIVITAMIN) tablet Take 1 tablet by mouth daily.     Yes Historical  Provider, MD  torsemide (DEMADEX) 20 MG tablet TAKE TWO TABLETS BY MOUTH ONCE DAILY 02/09/16  Yes [name], MD  valsartan-hydrochlorothiazide (DIOVAN-HCT) 80-12.5 MG tablet TAKE ONE TABLET BY MOUTH ONCE DAILY 02/09/16  Yes [name], MD  enoxaparin (LOVENOX) 150 MG/ML injection Inject 1 mL (150 mg total) into the skin every 12 (twelve) hours. 04/12/16   [name], MD  fondaparinux (ARIXTRA) 10 MG/0.8ML SOLN injection Inject 0.8 mLs (10 mg total) into the skin daily. 03/20/16   [name], MD    Current Facility-Administered Medications  Medication Dose Route Frequency Provider Last Rate Last Dose  . lactated ringers infusion   Intravenous Continuous Gatha Mayer, MD 20 mL/hr at 04/13/16 7124      Allergies as of 03/10/2016  . (No Known Allergies)    Family History  Problem Relation Age of Onset  . Heart disease Sister   . Uterine cancer Sister   . Obesity Sister     500+ lbs  . Heart disease Brother   . Heart disease Sister   . Heart disease Father   . Colon cancer Father 46  . Crohn's disease Paternal Grandmother     Social History   Social History  . Marital status: Married    Spouse name: N/A  . Number of children: 1  . Years of education: N/A   Occupational History  . quality engineer [employer]   Social History Main Topics  . Smoking status: Former Smoker    Types: Cigarettes    Quit date: 04/10/1977  . Smokeless tobacco: Never Used  . Alcohol use Yes     Comment: occasional  . Drug use: No  . Sexual activity: Not on file   Other Topics Concern  . Not on file   Social History Narrative   Married, one adopted son and one daughter. 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   2 caffeinated beverages daily    Review of Systems:  All other review of systems negative except as mentioned in the HPI.  Physical Exam: Vital signs in last 24 hours: Temp:  [97.7 F (36.5 C)] 97.7 F (36.5 C)  (01/04 0815) Pulse Rate:  [64] 64 (01/04 0815) Resp:  [24] 24 (01/04 0815) BP: (152)/(61) 152/61 (01/04 0815) SpO2:  [93 %] 93 % (01/04 0815) Weight:  [352 lb (159.7 kg)] 352 lb (159.7 kg) (01/04 0815)   General:   Alert,  Well-developed, well-nourished, pleasant and cooperative in NAD Lungs:  Clear throughout to auscultation.   Heart:  Regular rate and rhythm; no murmurs, clicks, rubs,  or gallops. Abdomen:  Soft, nontender and nondistended. Normal bowel sounds.   Neuro/Psych:  Alert and cooperative. Normal mood and affect. A and O  x 3   @Carl  Simonne Maffucci, MD, [facility] [phone] (pager) 04/13/2016 8:39 AM@

## 2016-04-15 NOTE — Progress Notes (Signed)
3 year colon recall 04/2019 1 ssp and 2 adenomas No letter - My Chart message sent

## 2016-04-17 ENCOUNTER — Ambulatory Visit (INDEPENDENT_AMBULATORY_CARE_PROVIDER_SITE_OTHER): Payer: Medicare Other | Admitting: *Deleted

## 2016-04-17 ENCOUNTER — Encounter (HOSPITAL_COMMUNITY): Payer: Self-pay | Admitting: Internal Medicine

## 2016-04-17 DIAGNOSIS — I4891 Unspecified atrial fibrillation: Secondary | ICD-10-CM

## 2016-04-17 DIAGNOSIS — Z5181 Encounter for therapeutic drug level monitoring: Secondary | ICD-10-CM | POA: Diagnosis not present

## 2016-04-17 DIAGNOSIS — G459 Transient cerebral ischemic attack, unspecified: Secondary | ICD-10-CM

## 2016-04-17 DIAGNOSIS — Z9889 Other specified postprocedural states: Secondary | ICD-10-CM | POA: Diagnosis not present

## 2016-04-17 LAB — POCT INR: INR: 2.1

## 2016-04-24 ENCOUNTER — Ambulatory Visit (INDEPENDENT_AMBULATORY_CARE_PROVIDER_SITE_OTHER): Payer: Medicare Other | Admitting: Pharmacist

## 2016-04-24 DIAGNOSIS — Z9889 Other specified postprocedural states: Secondary | ICD-10-CM | POA: Diagnosis not present

## 2016-04-24 DIAGNOSIS — I4891 Unspecified atrial fibrillation: Secondary | ICD-10-CM | POA: Diagnosis not present

## 2016-04-24 DIAGNOSIS — Z5181 Encounter for therapeutic drug level monitoring: Secondary | ICD-10-CM

## 2016-04-24 DIAGNOSIS — G459 Transient cerebral ischemic attack, unspecified: Secondary | ICD-10-CM | POA: Diagnosis not present

## 2016-04-24 LAB — POCT INR: INR: 2.8

## 2016-04-24 NOTE — Telephone Encounter (Signed)
Patient has been seen.

## 2016-05-05 ENCOUNTER — Telehealth: Payer: Self-pay | Admitting: Internal Medicine

## 2016-05-05 NOTE — Telephone Encounter (Signed)
Attempted to return call and no answer.  His voicemail is full. I mailed a copy of the message Dr. Carlean Purl sent him

## 2016-05-05 NOTE — Telephone Encounter (Signed)
Results were sent to the patient via mychart.  I reviewed the results with the patient.  All questions answered.

## 2016-05-05 NOTE — Telephone Encounter (Signed)
Patient states that he cannot find results on mychart. Best # [phone]

## 2016-05-18 ENCOUNTER — Ambulatory Visit (INDEPENDENT_AMBULATORY_CARE_PROVIDER_SITE_OTHER): Payer: Medicare Other | Admitting: *Deleted

## 2016-05-18 DIAGNOSIS — G459 Transient cerebral ischemic attack, unspecified: Secondary | ICD-10-CM | POA: Diagnosis not present

## 2016-05-18 DIAGNOSIS — Z5181 Encounter for therapeutic drug level monitoring: Secondary | ICD-10-CM

## 2016-05-18 DIAGNOSIS — I4891 Unspecified atrial fibrillation: Secondary | ICD-10-CM | POA: Diagnosis not present

## 2016-05-18 DIAGNOSIS — Z9889 Other specified postprocedural states: Secondary | ICD-10-CM | POA: Diagnosis not present

## 2016-05-18 LAB — POCT INR: INR: 3

## 2016-05-25 ENCOUNTER — Institutional Professional Consult (permissible substitution): Payer: Medicare Other | Admitting: Pulmonary Disease

## 2016-06-26 ENCOUNTER — Encounter (INDEPENDENT_AMBULATORY_CARE_PROVIDER_SITE_OTHER): Payer: Medicare Other

## 2016-06-26 DIAGNOSIS — G459 Transient cerebral ischemic attack, unspecified: Secondary | ICD-10-CM | POA: Diagnosis not present

## 2016-06-26 DIAGNOSIS — I4891 Unspecified atrial fibrillation: Secondary | ICD-10-CM

## 2016-06-26 DIAGNOSIS — Z9889 Other specified postprocedural states: Secondary | ICD-10-CM | POA: Diagnosis not present

## 2016-07-05 ENCOUNTER — Encounter: Payer: Self-pay | Admitting: Internal Medicine

## 2016-07-11 ENCOUNTER — Other Ambulatory Visit: Payer: Self-pay | Admitting: Internal Medicine

## 2016-07-11 DIAGNOSIS — I4891 Unspecified atrial fibrillation: Secondary | ICD-10-CM

## 2016-07-17 ENCOUNTER — Ambulatory Visit (INDEPENDENT_AMBULATORY_CARE_PROVIDER_SITE_OTHER): Payer: Medicare Other | Admitting: Internal Medicine

## 2016-07-17 ENCOUNTER — Encounter: Payer: Self-pay | Admitting: Internal Medicine

## 2016-07-17 ENCOUNTER — Ambulatory Visit (INDEPENDENT_AMBULATORY_CARE_PROVIDER_SITE_OTHER): Payer: Self-pay | Admitting: *Deleted

## 2016-07-17 VITALS — BP 144/74 | HR 60 | Ht 71.0 in | Wt 354.2 lb

## 2016-07-17 DIAGNOSIS — Z5181 Encounter for therapeutic drug level monitoring: Secondary | ICD-10-CM

## 2016-07-17 DIAGNOSIS — I480 Paroxysmal atrial fibrillation: Secondary | ICD-10-CM | POA: Diagnosis not present

## 2016-07-17 DIAGNOSIS — R0602 Shortness of breath: Secondary | ICD-10-CM

## 2016-07-17 DIAGNOSIS — I059 Rheumatic mitral valve disease, unspecified: Secondary | ICD-10-CM | POA: Diagnosis not present

## 2016-07-17 DIAGNOSIS — I421 Obstructive hypertrophic cardiomyopathy: Secondary | ICD-10-CM | POA: Diagnosis not present

## 2016-07-17 DIAGNOSIS — I4891 Unspecified atrial fibrillation: Secondary | ICD-10-CM

## 2016-07-17 DIAGNOSIS — I441 Atrioventricular block, second degree: Secondary | ICD-10-CM | POA: Diagnosis not present

## 2016-07-17 DIAGNOSIS — R0902 Hypoxemia: Secondary | ICD-10-CM

## 2016-07-17 DIAGNOSIS — I5030 Unspecified diastolic (congestive) heart failure: Secondary | ICD-10-CM

## 2016-07-17 DIAGNOSIS — Z950 Presence of cardiac pacemaker: Secondary | ICD-10-CM

## 2016-07-17 DIAGNOSIS — Z9889 Other specified postprocedural states: Secondary | ICD-10-CM

## 2016-07-17 LAB — POCT INR: INR: 2.9

## 2016-07-17 MED ORDER — VALSARTAN-HYDROCHLOROTHIAZIDE 80-12.5 MG PO TABS: 1.0000 | ORAL_TABLET | Freq: Every day | ORAL | 3 refills | Status: DC

## 2016-07-17 MED ORDER — MAGNESIUM OXIDE 250 MG PO TABS: 1.0000 | ORAL_TABLET | Freq: Three times a day (TID) | ORAL | 3 refills | Status: DC

## 2016-07-17 MED ORDER — TORSEMIDE 20 MG PO TABS: 40.0000 mg | ORAL_TABLET | Freq: Every day | ORAL | 3 refills | Status: DC

## 2016-07-17 MED ORDER — KLOR-CON 10 10 MEQ PO TBCR
EXTENDED_RELEASE_TABLET | ORAL | 3 refills | Status: DC
Start: 2016-07-17 — End: 2017-02-07

## 2016-07-17 NOTE — Progress Notes (Signed)
Patient Care Team: No Pcp Per Patient as PCP - General (General Practice)   HPI  Frank [name] is a 71 y.o. 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 He has profound first degree heart block and is status post pacemaker implantation. He had struggled with HFpEF    Years ago he  saw Dr. Reine Just who recommended nocturnal oxygen and a right left heart catheter. The patient deferred on both ; with a change in diet he felt considerably better for a period of time.  He is working again. He feels much better about. His shortness of breath is stable. His edema is stable. He has noted however that his oxygen saturations are in the 80s oftentimes during the day. The early morning he may be as high as 91-2  He has not had a sleep study  DATE TEST    4/17    echo   EF 55-60 % Severe LVH (18/17). [employer]  PA press 62          CPX test done about 2010 demonstrated a VO2 max of 12.2 representing 66.4% predicted. his VO2 adjusted for body weight was 25.9 His slope was 34 and his O2 pulse was 17, 88% of predicted.    Past Medical History:  Diagnosis Date  . Anxiety   . Atrial arrhythmia    status post ablation in [place] with complication including damage to the aortic valve  . Atrial fibrillation ([place])   . AV block, 1st degree   . Bradycardia   . Diastolic CHF, chronic ([place])   . Diverticulosis   . History of melanoma   . HTN (hypertension)   . Hypertrophic cardiomyopathy ([place])    s/p myoectomy  . Internal hemorrhoids   . mitral valve replacement-St. Jude's mechanical   . Morbid obesity ([place])   . Pacemaker  -SJM   . S/P aortic valve repair   . TIA (transient ischemic attack) 2001  . Tubulovillous adenoma     Past Surgical History:  Procedure Laterality Date  . AORTIC VALVE REPLACEMENT     mechanical-following traumatic injury during an ablation procedure  . COLONOSCOPY    . COLONOSCOPY WITH PROPOFOL N/A  04/13/2016   Procedure: COLONOSCOPY WITH PROPOFOL;  Surgeon: Gatha Mayer, MD;  Location: [facility];  Service: Endoscopy;  Laterality: N/A;  . MITRAL VALVE REPLACEMENT     mechanical  . PACEMAKER INSERTION     St Jude Accent   . septal myectomy      Current Outpatient Prescriptions  Medication Sig Dispense Refill  . Ascorbic Acid (VITAMIN C) 1000 MG tablet Take 2,000 mg by mouth daily.      Marland Kitchen aspirin 81 MG tablet Take 81 mg by mouth daily.    Marland Kitchen COUMADIN 10 MG tablet TAKE AS DIRECTED PER COUMADIN CLINIC (Patient taking differently: Take 10mg  by mouth daily except on wednesdays take 15mg .) 100 tablet 1  . fish oil-omega-3 fatty acids 1000 MG capsule Take by mouth. Take 4 capsules by mouth once daily    . KLOR-CON 10 10 MEQ tablet TAKE EIGHT TABLETS BY MOUTH ONCE DAILY 240 tablet 0  . Magnesium Oxide 250 MG TABS Take 1 tablet by mouth 3 (three) times daily.    . Multiple Vitamin (MULTIVITAMIN) tablet Take 1 tablet by mouth daily.      Marland Kitchen torsemide (DEMADEX) 20 MG tablet TAKE TWO TABLETS BY MOUTH ONCE DAILY 60 tablet 0  . valsartan-hydrochlorothiazide (  DIOVAN-HCT) 80-12.5 MG tablet TAKE ONE TABLET BY MOUTH ONCE DAILY 30 tablet 0   No current facility-administered medications for this visit.     No Known Allergies  Review of Systems negative except from HPI and PMH  Physical Exam BP (!) 144/74   Pulse 60   Ht 5\' 11"  (1.803 m)   Wt (!) 354 lb 3.2 oz (160.7 kg)   SpO2 92%   BMI 49.40 kg/m  Well developed and morbidly obese in no acute distress HENT normal E scleral and icterus clear Neck Supple Device pocket well healed; without hematoma or erythema .dpClear to ausculation Regular rate and rhythm, mechanical S1  with early systolic murmur Soft with active bowel sounds No clubbing cyanosis 3+ Edema right greater than left Alert and oriented, grossly normal motor and sensory function Skin Warm and Dry  ECG demonstrates a regular wide-complex rhythm with a right bundle branch  block pattern there is right axis deviation. Internal micrograms demonstrated this represents to 1 AV conduction from a slow atrial tachycardia at a rate of 120 beats per minute  Assessment and  Plan  Atrial fibrillation-paroxysmal  Mitral valve replacement-mechanical  Hypertrophic cardiomyopathy status post myectomy  HFpEF  Mobitz 1 second degree heart block  Mechanical lead failure-noise reversion   A and V   Pacemaker-St. Jude  VT  Nonsustained   Hypoxemia     He remains volume overloaded, but is content with his fluid management. We will allow him to adjust his diuretics as he wants, but suspect he would feel better at about 350 lbs Unchanged  His LV funtion remains stable;  MVR function normal   I am concerned about the patient's hypoxemia. He is agreeable to meeting with pulmonary.  Nocturnal oxygen previously recommended might be the minimum. I think he needs a sleep study.  He has some degree of pulmonary hypertension and long-term consequences were reviewed.  I have also raised the possibility that long term O2 may be recommended  Nonsustained ventricular tachycardia may be a consequence also with this.  Device function is stable. At time of generator replacement we will need to address the lead issue.  He has some degree of atrial fibrillation arthroscopy to quantitate his limited by the noise reversion. He is on Coumadin and aspirin.  We will check his blood count today as well as his TSH.  Also need to check renal function. His medications.   More than 50% of 45 min was spent in counseling related to the above

## 2016-07-17 NOTE — Patient Instructions (Addendum)
Medication Instructions: - Your physician recommends that you continue on your current medications as directed. Please refer to the Current Medication list given to you today.  Labwork: - Your physician recommends that you have lab work today: CBC/ BMET/ TSH  Procedures/Testing: - none ordered  Follow-Up: - You have been referred to : [facility] (Dr. Elsworth Soho Dr. Chase Caller- needs an early appointment on a Monday morning) - Dx: Hypoxia  - Your physician wants you to follow-up in: 6 months with Dr. [name]. You will receive a reminder letter in the mail two months in advance. If you don't receive a letter, please call our office to schedule the follow-up appointment.   Any Additional Special Instructions Will Be Listed Below (If Applicable).     If you need a refill on your cardiac medications before your next appointment, please call your pharmacy.

## 2016-07-18 ENCOUNTER — Other Ambulatory Visit: Payer: Medicare Other

## 2016-07-18 DIAGNOSIS — I480 Paroxysmal atrial fibrillation: Secondary | ICD-10-CM | POA: Diagnosis not present

## 2016-07-18 LAB — CBC WITH DIFFERENTIAL/PLATELET
BASOS: 1 %
Basophils Absolute: 0 10*3/uL (ref 0.0–0.2)
EOS (ABSOLUTE): 0.2 10*3/uL (ref 0.0–0.4)
EOS: 2 %
HEMATOCRIT: 40.1 % (ref 37.5–51.0)
HEMOGLOBIN: 14 g/dL (ref 13.0–17.7)
IMMATURE GRANS (ABS): 0 10*3/uL (ref 0.0–0.1)
IMMATURE GRANULOCYTES: 0 %
LYMPHS: 13 %
Lymphocytes Absolute: 1.1 10*3/uL (ref 0.7–3.1)
MCH: 33.9 pg — ABNORMAL HIGH (ref 26.6–33.0)
MCHC: 34.9 g/dL (ref 31.5–35.7)
MCV: 97 fL (ref 79–97)
Monocytes Absolute: 0.8 10*3/uL (ref 0.1–0.9)
Monocytes: 10 %
NEUTROS ABS: 6.3 10*3/uL (ref 1.4–7.0)
NEUTROS PCT: 74 %
Platelets: 234 10*3/uL (ref 150–379)
RBC: 4.13 x10E6/uL — ABNORMAL LOW (ref 4.14–5.80)
RDW: 14 % (ref 12.3–15.4)
WBC: 8.4 10*3/uL (ref 3.4–10.8)

## 2016-07-18 LAB — CUP PACEART INCLINIC DEVICE CHECK
Implantable Lead Implant Date: 20100414
Implantable Lead Location: 753860
Implantable Pulse Generator Implant Date: 20100414
MDC IDC LEAD IMPLANT DT: 20100414
MDC IDC LEAD LOCATION: 753859
MDC IDC SESS DTM: 20180410191053
Pulse Gen Serial Number: 2272351

## 2016-07-18 LAB — BASIC METABOLIC PANEL
BUN/Creatinine Ratio: 18 (ref 10–24)
BUN: 16 mg/dL (ref 8–27)
CALCIUM: 9.5 mg/dL (ref 8.6–10.2)
CHLORIDE: 98 mmol/L (ref 96–106)
CO2: 24 mmol/L (ref 18–29)
CREATININE: 0.9 mg/dL (ref 0.76–1.27)
GFR, EST AFRICAN AMERICAN: 101 mL/min/{1.73_m2} (ref >59)
GFR, EST NON AFRICAN AMERICAN: 87 mL/min/{1.73_m2} (ref >59)
Glucose: 118 mg/dL — ABNORMAL HIGH (ref 65–99)
Potassium: 4.1 mmol/L (ref 3.5–5.2)
Sodium: 141 mmol/L (ref 134–144)

## 2016-07-18 LAB — TSH: TSH: 3.01 u[IU]/mL (ref 0.450–4.500)

## 2016-07-18 LAB — MAGNESIUM: Magnesium: 2.3 mg/dL (ref 1.6–2.3)

## 2016-08-10 ENCOUNTER — Telehealth: Payer: Self-pay | Admitting: Internal Medicine

## 2016-08-10 DIAGNOSIS — R6884 Jaw pain: Secondary | ICD-10-CM | POA: Diagnosis not present

## 2016-08-10 NOTE — Telephone Encounter (Signed)
DISCUSSED  WITH  DR  [name]   AND  PT  NEEDS TO  GO TO  URGENT  CARE  TO ADDRESS  TOOTH INFECTION.NOTIFIED  PT   OF  ABOVE . PER  PT  HAS  ALREADY  BEEN SEEN   AND IS ON WAY  TO  PHARMACY .[name]

## 2016-08-10 NOTE — Telephone Encounter (Signed)
Patient states he has infected tooth and wants Dr. [name] to call in an antibiotic

## 2016-09-01 ENCOUNTER — Ambulatory Visit (INDEPENDENT_AMBULATORY_CARE_PROVIDER_SITE_OTHER): Payer: Medicare Other | Admitting: *Deleted

## 2016-09-01 DIAGNOSIS — I4891 Unspecified atrial fibrillation: Secondary | ICD-10-CM

## 2016-09-01 DIAGNOSIS — Z5181 Encounter for therapeutic drug level monitoring: Secondary | ICD-10-CM

## 2016-09-01 DIAGNOSIS — Z9889 Other specified postprocedural states: Secondary | ICD-10-CM | POA: Diagnosis not present

## 2016-09-01 DIAGNOSIS — G459 Transient cerebral ischemic attack, unspecified: Secondary | ICD-10-CM

## 2016-09-01 LAB — POCT INR: INR: 2.5

## 2016-09-11 ENCOUNTER — Institutional Professional Consult (permissible substitution): Payer: Medicare Other | Admitting: Internal Medicine

## 2016-10-13 ENCOUNTER — Ambulatory Visit (INDEPENDENT_AMBULATORY_CARE_PROVIDER_SITE_OTHER): Payer: Medicare Other | Admitting: *Deleted

## 2016-10-13 DIAGNOSIS — Z5181 Encounter for therapeutic drug level monitoring: Secondary | ICD-10-CM | POA: Diagnosis not present

## 2016-10-13 DIAGNOSIS — I4891 Unspecified atrial fibrillation: Secondary | ICD-10-CM

## 2016-10-13 DIAGNOSIS — Z9889 Other specified postprocedural states: Secondary | ICD-10-CM

## 2016-10-13 DIAGNOSIS — G459 Transient cerebral ischemic attack, unspecified: Secondary | ICD-10-CM | POA: Diagnosis not present

## 2016-10-13 LAB — POCT INR: INR: 2.7

## 2016-10-16 ENCOUNTER — Encounter: Payer: Medicare Other | Admitting: *Deleted

## 2016-10-16 ENCOUNTER — Telehealth: Payer: Self-pay | Admitting: Cardiology

## 2016-10-16 ENCOUNTER — Other Ambulatory Visit: Payer: Self-pay | Admitting: Internal Medicine

## 2016-10-16 NOTE — Telephone Encounter (Signed)
Spoke with pt and reminded pt of remote transmission that is due today. Pt verbalized understanding.   

## 2016-10-24 ENCOUNTER — Encounter: Payer: Self-pay | Admitting: Cardiology

## 2016-10-31 ENCOUNTER — Telehealth: Payer: Self-pay | Admitting: Pharmacist

## 2016-10-31 MED ORDER — LOSARTAN POTASSIUM-HCTZ 50-12.5 MG PO TABS: 1.0000 | ORAL_TABLET | Freq: Every day | ORAL | 1 refills | Status: DC

## 2016-10-31 NOTE — Telephone Encounter (Signed)
Called spoke with pt advised of medication changes, new rx sent to pt's pharmacy.  WCB if BP is not as well controlled on alternate medications.

## 2016-10-31 NOTE — Telephone Encounter (Signed)
Due to recall received request for alternative from pharmacy.

## 2016-11-09 ENCOUNTER — Ambulatory Visit (INDEPENDENT_AMBULATORY_CARE_PROVIDER_SITE_OTHER): Payer: Medicare Other | Admitting: *Deleted

## 2016-11-09 DIAGNOSIS — I441 Atrioventricular block, second degree: Secondary | ICD-10-CM | POA: Diagnosis not present

## 2016-11-10 ENCOUNTER — Encounter: Payer: Self-pay | Admitting: Cardiology

## 2016-11-10 NOTE — Progress Notes (Signed)
Remote pacemaker transmission.   

## 2016-11-17 ENCOUNTER — Ambulatory Visit (INDEPENDENT_AMBULATORY_CARE_PROVIDER_SITE_OTHER): Payer: Medicare Other | Admitting: Pharmacist

## 2016-11-17 DIAGNOSIS — Z9889 Other specified postprocedural states: Secondary | ICD-10-CM

## 2016-11-17 DIAGNOSIS — G459 Transient cerebral ischemic attack, unspecified: Secondary | ICD-10-CM

## 2016-11-17 DIAGNOSIS — I4891 Unspecified atrial fibrillation: Secondary | ICD-10-CM | POA: Diagnosis not present

## 2016-11-17 DIAGNOSIS — Z5181 Encounter for therapeutic drug level monitoring: Secondary | ICD-10-CM

## 2016-11-17 LAB — POCT INR: INR: 3.6

## 2016-11-28 DIAGNOSIS — D1801 Hemangioma of skin and subcutaneous tissue: Secondary | ICD-10-CM | POA: Diagnosis not present

## 2016-11-28 DIAGNOSIS — D229 Melanocytic nevi, unspecified: Secondary | ICD-10-CM | POA: Diagnosis not present

## 2016-11-28 DIAGNOSIS — M7981 Nontraumatic hematoma of soft tissue: Secondary | ICD-10-CM | POA: Diagnosis not present

## 2016-11-28 DIAGNOSIS — L821 Other seborrheic keratosis: Secondary | ICD-10-CM | POA: Diagnosis not present

## 2016-12-04 ENCOUNTER — Encounter: Payer: Self-pay | Admitting: Cardiology

## 2016-12-15 ENCOUNTER — Ambulatory Visit (INDEPENDENT_AMBULATORY_CARE_PROVIDER_SITE_OTHER): Payer: Medicare Other | Admitting: *Deleted

## 2016-12-15 DIAGNOSIS — I4891 Unspecified atrial fibrillation: Secondary | ICD-10-CM | POA: Diagnosis not present

## 2016-12-15 DIAGNOSIS — Z9889 Other specified postprocedural states: Secondary | ICD-10-CM | POA: Diagnosis not present

## 2016-12-15 DIAGNOSIS — G459 Transient cerebral ischemic attack, unspecified: Secondary | ICD-10-CM | POA: Diagnosis not present

## 2016-12-15 DIAGNOSIS — Z5181 Encounter for therapeutic drug level monitoring: Secondary | ICD-10-CM

## 2016-12-15 LAB — POCT INR: INR: 2.8

## 2016-12-19 LAB — CUP PACEART REMOTE DEVICE CHECK
Battery Remaining Longevity: 9 mo
Battery Remaining Percentage: 8 %
Battery Voltage: 2.69 V
Brady Statistic AP VP Percent: 87 %
Brady Statistic AS VS Percent: 1 %
Brady Statistic RV Percent Paced: 97 %
Implantable Lead Implant Date: 20100414
Implantable Lead Location: 753860
Implantable Pulse Generator Implant Date: 20100414
Lead Channel Impedance Value: 400 Ohm
Lead Channel Impedance Value: 540 Ohm
Lead Channel Pacing Threshold Amplitude: 0.75 V
Lead Channel Pacing Threshold Pulse Width: 0.4 ms
Lead Channel Sensing Intrinsic Amplitude: 1 mV
Lead Channel Setting Pacing Amplitude: 2.5 V
MDC IDC LEAD IMPLANT DT: 20100414
MDC IDC LEAD LOCATION: 753859
MDC IDC MSMT LEADCHNL RA PACING THRESHOLD PULSEWIDTH: 0.4 ms
MDC IDC MSMT LEADCHNL RV PACING THRESHOLD AMPLITUDE: 0.75 V
MDC IDC MSMT LEADCHNL RV SENSING INTR AMPL: 11.7 mV
MDC IDC PG SERIAL: 2272351
MDC IDC SESS DTM: 20180803025438
MDC IDC SET LEADCHNL RA PACING AMPLITUDE: 2 V
MDC IDC SET LEADCHNL RV PACING PULSEWIDTH: 0.4 ms
MDC IDC SET LEADCHNL RV SENSING SENSITIVITY: 2 mV
MDC IDC STAT BRADY AP VS PERCENT: 1 %
MDC IDC STAT BRADY AS VP PERCENT: 10 %
MDC IDC STAT BRADY RA PERCENT PACED: 87 %

## 2017-01-16 ENCOUNTER — Telehealth: Payer: Self-pay | Admitting: Internal Medicine

## 2017-01-16 NOTE — Telephone Encounter (Signed)
Patient will come in tomorrow and see Tye Savoy RNP at 2:00

## 2017-01-17 ENCOUNTER — Inpatient Hospital Stay (HOSPITAL_COMMUNITY)
Admission: EM | Admit: 2017-01-17 | Discharge: 2017-01-21 | DRG: 292 | Disposition: A | Discharge status: Home / Self Care | Payer: Medicare Other | Attending: Internal Medicine | Admitting: Internal Medicine

## 2017-01-17 ENCOUNTER — Ambulatory Visit (INDEPENDENT_AMBULATORY_CARE_PROVIDER_SITE_OTHER): Payer: Medicare Other | Admitting: Nurse Practitioner

## 2017-01-17 ENCOUNTER — Encounter: Payer: Self-pay | Admitting: Nurse Practitioner

## 2017-01-17 ENCOUNTER — Emergency Department (HOSPITAL_COMMUNITY): Payer: Medicare Other

## 2017-01-17 ENCOUNTER — Other Ambulatory Visit: Payer: Self-pay | Admitting: Internal Medicine

## 2017-01-17 ENCOUNTER — Encounter (HOSPITAL_COMMUNITY): Payer: Self-pay

## 2017-01-17 VITALS — BP 118/60 | HR 72 | Ht 70.0 in | Wt 381.5 lb

## 2017-01-17 DIAGNOSIS — R0609 Other forms of dyspnea: Secondary | ICD-10-CM | POA: Diagnosis not present

## 2017-01-17 DIAGNOSIS — R600 Localized edema: Secondary | ICD-10-CM

## 2017-01-17 DIAGNOSIS — Z6841 Body Mass Index (BMI) 40.0 and over, adult: Secondary | ICD-10-CM

## 2017-01-17 DIAGNOSIS — Z950 Presence of cardiac pacemaker: Secondary | ICD-10-CM | POA: Diagnosis not present

## 2017-01-17 DIAGNOSIS — I509 Heart failure, unspecified: Secondary | ICD-10-CM

## 2017-01-17 DIAGNOSIS — I421 Obstructive hypertrophic cardiomyopathy: Secondary | ICD-10-CM | POA: Diagnosis not present

## 2017-01-17 DIAGNOSIS — I110 Hypertensive heart disease with heart failure: Principal | ICD-10-CM | POA: Diagnosis present

## 2017-01-17 DIAGNOSIS — M7989 Other specified soft tissue disorders: Secondary | ICD-10-CM | POA: Diagnosis not present

## 2017-01-17 DIAGNOSIS — I5043 Acute on chronic combined systolic (congestive) and diastolic (congestive) heart failure: Secondary | ICD-10-CM | POA: Diagnosis present

## 2017-01-17 DIAGNOSIS — R140 Abdominal distension (gaseous): Secondary | ICD-10-CM | POA: Diagnosis not present

## 2017-01-17 DIAGNOSIS — Z87891 Personal history of nicotine dependence: Secondary | ICD-10-CM

## 2017-01-17 DIAGNOSIS — Z7982 Long term (current) use of aspirin: Secondary | ICD-10-CM

## 2017-01-17 DIAGNOSIS — I5030 Unspecified diastolic (congestive) heart failure: Secondary | ICD-10-CM | POA: Diagnosis not present

## 2017-01-17 DIAGNOSIS — E876 Hypokalemia: Secondary | ICD-10-CM | POA: Diagnosis present

## 2017-01-17 DIAGNOSIS — Z9889 Other specified postprocedural states: Secondary | ICD-10-CM

## 2017-01-17 DIAGNOSIS — I480 Paroxysmal atrial fibrillation: Secondary | ICD-10-CM | POA: Diagnosis present

## 2017-01-17 DIAGNOSIS — I4891 Unspecified atrial fibrillation: Secondary | ICD-10-CM | POA: Diagnosis present

## 2017-01-17 DIAGNOSIS — R0602 Shortness of breath: Secondary | ICD-10-CM | POA: Diagnosis not present

## 2017-01-17 DIAGNOSIS — R1900 Intra-abdominal and pelvic swelling, mass and lump, unspecified site: Secondary | ICD-10-CM

## 2017-01-17 DIAGNOSIS — R21 Rash and other nonspecific skin eruption: Secondary | ICD-10-CM | POA: Diagnosis not present

## 2017-01-17 DIAGNOSIS — R05 Cough: Secondary | ICD-10-CM | POA: Diagnosis not present

## 2017-01-17 DIAGNOSIS — K625 Hemorrhage of anus and rectum: Secondary | ICD-10-CM | POA: Diagnosis not present

## 2017-01-17 DIAGNOSIS — F419 Anxiety disorder, unspecified: Secondary | ICD-10-CM | POA: Diagnosis present

## 2017-01-17 DIAGNOSIS — Z952 Presence of prosthetic heart valve: Secondary | ICD-10-CM | POA: Diagnosis not present

## 2017-01-17 DIAGNOSIS — R0902 Hypoxemia: Secondary | ICD-10-CM

## 2017-01-17 DIAGNOSIS — Z79899 Other long term (current) drug therapy: Secondary | ICD-10-CM

## 2017-01-17 DIAGNOSIS — I10 Essential (primary) hypertension: Secondary | ICD-10-CM | POA: Diagnosis present

## 2017-01-17 DIAGNOSIS — R635 Abnormal weight gain: Secondary | ICD-10-CM

## 2017-01-17 DIAGNOSIS — Z8673 Personal history of transient ischemic attack (TIA), and cerebral infarction without residual deficits: Secondary | ICD-10-CM

## 2017-01-17 DIAGNOSIS — Z7901 Long term (current) use of anticoagulants: Secondary | ICD-10-CM

## 2017-01-17 DIAGNOSIS — I5042 Chronic combined systolic (congestive) and diastolic (congestive) heart failure: Secondary | ICD-10-CM | POA: Diagnosis present

## 2017-01-17 DIAGNOSIS — J810 Acute pulmonary edema: Secondary | ICD-10-CM | POA: Diagnosis not present

## 2017-01-17 LAB — CBC
HEMATOCRIT: 40 % (ref 39.0–52.0)
Hemoglobin: 12.9 g/dL — ABNORMAL LOW (ref 13.0–17.0)
MCH: 32.6 pg (ref 26.0–34.0)
MCHC: 32.3 g/dL (ref 30.0–36.0)
MCV: 101 fL — AB (ref 78.0–100.0)
PLATELETS: 217 10*3/uL (ref 150–400)
RBC: 3.96 MIL/uL — ABNORMAL LOW (ref 4.22–5.81)
RDW: 15.1 % (ref 11.5–15.5)
WBC: 8.8 10*3/uL (ref 4.0–10.5)

## 2017-01-17 LAB — PROTIME-INR
INR: 2.67
PROTHROMBIN TIME: 28.2 s — AB (ref 11.4–15.2)

## 2017-01-17 LAB — BASIC METABOLIC PANEL
Anion gap: 10 (ref 5–15)
BUN: 19 mg/dL (ref 6–20)
CHLORIDE: 96 mmol/L — AB (ref 101–111)
CO2: 28 mmol/L (ref 22–32)
CREATININE: 1.03 mg/dL (ref 0.61–1.24)
Calcium: 8.9 mg/dL (ref 8.9–10.3)
GFR calc Af Amer: >60 mL/min (ref >60)
GFR calc non Af Amer: >60 mL/min (ref >60)
Glucose, Bld: 161 mg/dL — ABNORMAL HIGH (ref 65–99)
POTASSIUM: 4 mmol/L (ref 3.5–5.1)
Sodium: 134 mmol/L — ABNORMAL LOW (ref 135–145)

## 2017-01-17 LAB — BRAIN NATRIURETIC PEPTIDE: B NATRIURETIC PEPTIDE 5: 75.4 pg/mL (ref 0.0–100.0)

## 2017-01-17 LAB — I-STAT TROPONIN, ED: Troponin i, poc: 0.01 ng/mL (ref 0.00–0.08)

## 2017-01-17 MED ORDER — FUROSEMIDE 10 MG/ML IJ SOLN
40.0000 mg | Freq: Once | INTRAMUSCULAR | Status: AC
  Administered 2017-01-17: 40 mg via INTRAVENOUS
  Filled 2017-01-17: qty 4

## 2017-01-17 NOTE — ED Triage Notes (Signed)
Pt arrives from GI dr with complains of increased sob worse with exertion, increased abdominal swelling, and rash to bilateral arms and torso. Pt states he has noticed increased fluid in abdomen x ~6 weeks. He reports hx of CHF on and off and he is on torsemide. Denies cp.

## 2017-01-17 NOTE — Progress Notes (Deleted)
     HPI: Patient is a 71 year old male known to Dr. Carlean Purl for history of colon polyps. He is up-to-date on colonoscopy, next one due 2021. Patient is a very complex cardiac history, he is chronically anticoagulated on Coumadin. He is morbidly obese.  Frank [name] is here today with complaints of weight gain, abdominal swelling, weakness and a diffuse rash. His appetite is poor, not eating as much but still gained 27 pounds since April. His chronic SOB is slightly worse. No cough. No chest pain. He has some mild constipation but o/w BMs okay. No nausea / vomiting.  Six weeks ago his abdomen turned stiff and leathery like then it began increasing in girth. Around the same time he developed a rash on his left forearm which happened about the same time a small insect bit him. The rash spread to his torso and legs over the following several weeks. Initially he had a lot of itching. Now the skin on his abdomen is turning dark. He has blurry vision in left eye.     Past Medical History:  Diagnosis Date  . Anxiety   . Atrial arrhythmia    status post ablation in [place] with complication including damage to the aortic valve  . Atrial fibrillation ([place])   . AV block, 1st degree   . Bradycardia   . Diastolic CHF, chronic ([place])   . Diverticulosis   . History of melanoma   . HTN (hypertension)   . Hypertrophic cardiomyopathy ([place])    s/p myoectomy  . Internal hemorrhoids   . mitral valve replacement-St. Jude's mechanical   . Morbid obesity ([place])   . Pacemaker  -SJM   . S/P aortic valve repair   . TIA (transient ischemic attack) 2001  . Tubulovillous adenoma     Patient's surgical history, family medical history, social history, medications and allergies were all reviewed in Epic    Physical Exam: BP 118/60 (BP Location: Left Arm, Patient Position: Sitting, Cuff Size: Large)   Pulse 72   Ht 5\' 10"  (1.778 m)   Wt (!) 381 lb 8 oz (173 kg)   BMI 54.74 kg/m   GENERAL:  Morbidly obese white  male in NAD PSYCH: :Pleasant, cooperative, normal affect EENT:  conjunctiva pink, mucous membranes moist, neck supple without masses CARDIAC:  RRR, ***murmur heard, + peripheral edema PULM: Normal respiratory effort, lungs CTA bilaterally, no wheezing ABDOMEN:  Nondistended, soft, nontender. No obvious masses, no hepatomegaly,  normal bowel sounds SKIN:  turgor, no lesions seen Musculoskeletal:  Normal muscle tone, normal strength NEURO: Alert and oriented x 3, no focal neurologic deficits    ASSESSMENT and PLAN:  1. Pleasant *** year old   2.   [place] , NP 01/17/2017, 2:21 PM

## 2017-01-17 NOTE — Progress Notes (Addendum)
HPI: Patient is a 71 year old male known to Dr. Carlean Purl for history of adenomatous colon polyps. He is morbidly obese and has a complex cardiac history  including PAF, hypertrophic cardiomyopathy, diastolic heart failure, MVR, aortic valve repair. He has first-degree heart block, status post pacemaker. He is chronically anticoagulated.   Frank [name] comes in today for evaluation of several issues. A few weeks ago he was bitten by a small "bug" on LUE. On same day he began developing a "rash" at the site which over the last month has spread to his chest, abdomen, groin and distal lower extremities. He has not started any new medications recently. Initially the rash was itchy, now resolved. He has concurrently developed an increase in abdominal girth despite poor appetite. He feels weak, especially in his legs. Chronic shortness of breath is worse than normal. His weight is up about 27 pounds since April when he last saw Cardiology. He isn't eating as much lately, cannot finish meals. He is complaint with diuretics. Patient doesn't have a PCP. Dermatology nor Cardiology could see him anytime soon so he came to see [name].     Past Medical History:  Diagnosis Date  . Anxiety   . Atrial arrhythmia    status post ablation in [place] with complication including damage to the aortic valve  . Atrial fibrillation ([place])   . AV block, 1st degree   . Bradycardia   . Diastolic CHF, chronic ([place])   . Diverticulosis   . History of melanoma   . HTN (hypertension)   . Hypertrophic cardiomyopathy ([place])    s/p myoectomy  . Internal hemorrhoids   . mitral valve replacement-St. Jude's mechanical   . Morbid obesity ([place])   . Pacemaker  -SJM   . S/P aortic valve repair   . TIA (transient ischemic attack) 2001  . Tubulovillous adenoma     Patient's surgical history, family medical history, social history, medications and allergies were all reviewed in Epic    Physical Exam: BP 118/60 (BP Location: Left  Arm, Patient Position: Sitting, Cuff Size: Large)   Pulse 72   Ht 5\' 10"  (1.778 m)   Wt (!) 381 lb 8 oz (173 kg)   BMI 54.74 kg/m   GENERAL:  Morbidly obese white male in NAD PSYCH: :Pleasant, cooperative, normal affect EENT:  conjunctiva pink, mucous membranes moist, neck supple without masses CARDIAC:  RRR, pitting edema.  PULM: Normal respiratory effort, lungs CTA bilaterally, no wheezing ABDOMEN:  Obese, massive pannicula. Abdomen is diffusely erythematous, slightly warm. Upper abdomen with a patches of red rash / bumps. Abdominal wall edema. Normal bowel sounds SKIN:  Left forearm with scattered patches of raised red bumps. Similar patches on distal BLE extremities. Inner thighs slightly erythematous.  Musculoskeletal:  Normal muscle tone, normal strength NEURO: Alert and oriented x 3, no focal neurologic deficits   ASSESSMENT and PLAN:   Pleasant 71 year old male with a complex cardiac history worked in today with complaints of abdominal bloating, weight gain, minor rectal bleeding and generalized skin rash. His chronic SOB is slightly worse. He has gained ~ 27 pounds since April. He has a hx of CHF. I suspect he is volume overloaded with abdominal wall edema and BLE edema. Additionally he may have cellulitis, at least involving abdomen. A rash on distal lower extremities and left forearm may have a fungal component.   -Patient has no PCP. I tried to get him directed admitted to [facility] with the Hospitalist, no  beds available. At this point I advised him to go to [facility] ED for evaluation. We can address his minor rectal bleeding when acute issues resolve. He just had a colonoscopy in January so I'm not overly concerned.    Frank [name] , NP 01/17/2017, 2:17 PM  Addendum: Reviewed and agree with initial management. Pyrtle, Lajuan Lines, MD

## 2017-01-17 NOTE — ED Provider Notes (Signed)
[facility] DEPT Provider Note  CSN: [id number] Arrival date & time: 01/17/17  1642  History   Chief Complaint Chief Complaint  Patient presents with  . Rash  . Fluid Retention  . Shortness of Breath   HPI Frank [name] is a 71 y.o. male.  The patient is a 71 year old male with past medical history significant for HFrEF (EF 55-60% 07/2015), hypertrophic cardiomyopathy, mitral valve replacement, aortic valve repair, pacemaker, TIA, morbid obesity, hypertension, and anxiety, who presents to the ED complaining of shortness of breath, abdominal and lower extremity swelling, and a rash.  The patient has been living in [place] for the past month. He reports a 20-pound weight gain during that time, with no reported dietary changes. He has been compliant with all of his home medications. He also reports that he was bitten by an insect on the left forearm approximately 3 weeks ago, and subsequently developed an erythematous, pruritic rash that has spread to his bilateral upper and lower extremities as well as his abdomen. He also reports abdominal swelling over the past several weeks; he can no longer fit into his normal pants and his belt loop size has increased twice. He called his cardiologist about these symptoms but is unable to be seen until January of next year.  He then made an appointment with his gastroenterologist today who instructed him to come to the ED for further evaluation after an office visit.  He denies orthopnea, but reports dyspnea with exertion and generalized fatigue. No wheezing, fever or cough.   The history is provided by the patient and medical records. No language interpreter was used.   Past Medical History:  Diagnosis Date  . Anxiety   . Atrial arrhythmia    status post ablation in [place] with complication including damage to the aortic valve  . Atrial fibrillation ([place])   . AV block, 1st degree   . Bradycardia   . Diastolic CHF, chronic ([place])   .  Diverticulosis   . History of melanoma   . HTN (hypertension)   . Hypertrophic cardiomyopathy ([place])    s/p myoectomy  . Internal hemorrhoids   . mitral valve replacement-St. Jude's mechanical   . Morbid obesity ([place])   . Pacemaker  -SJM   . S/P aortic valve repair   . TIA (transient ischemic attack) 2001  . Tubulovillous adenoma    Patient Active Problem List   Diagnosis Date Noted  . Benign neoplasm of ascending colon   . Benign neoplasm of cecum   . Benign neoplasm of transverse colon   . Benign neoplasm of sigmoid colon   . Hx of adenomatous colonic polyps 03/10/2016  . Dyspnea on exertion 07/12/2015  . Encounter for therapeutic drug monitoring 05/19/2013  . Cardiac pacemaker-St.Jude 06/22/2011  . 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 S/P mitral valve replacement-Mechanical 10/10/2010  . Atrial fibrillation/tachycardia 06/08/2010  . TIA (transient ischemic attack) 06/08/2010  . S/P aortic valve repair 06/08/2010  . DIASTOLIC HEART FAILURE, CHRONIC 08/10/2009  . VENTRICULAR TACHYCARDIA 11/23/2008  . AV BLOCK, 1ST DEGREE 07/17/2008  . MORBID OBESITY 06/10/2007  . Essential hypertension 06/10/2007  . Hypertrophic obstructive cardiomyopathy ([place]) 06/10/2007  . DYSPNEA 06/10/2007  . CT, CHEST, ABNORMAL 06/10/2007  . MELANOMA, HX OF 06/10/2007    Past Surgical History:  Procedure Laterality Date  . AORTIC VALVE REPLACEMENT     mechanical-following traumatic injury during an ablation procedure  . COLONOSCOPY    . COLONOSCOPY WITH PROPOFOL N/A 04/13/2016  Procedure: COLONOSCOPY WITH PROPOFOL;  Surgeon: Gatha Mayer, MD;  Location: [facility] ENDOSCOPY;  Service: Endoscopy;  Laterality: N/A;  . MITRAL VALVE REPLACEMENT     mechanical  . PACEMAKER INSERTION     St Jude Accent   . septal myectomy       Home Medications    Prior to Admission medications   Medication Sig Start Date End Date Taking? Authorizing Provider  Ascorbic Acid (VITAMIN C) 1000 MG tablet  Take 2,000 mg by mouth daily.      [provider]  aspirin 81 MG tablet Take 81 mg by mouth daily.    [provider]  COUMADIN 10 MG tablet TAKE AS DIRECTED BY COUMADIN CLINIC 01/17/17   [name], MD  fish oil-omega-3 fatty acids 1000 MG capsule Take by mouth. Take 4 capsules by mouth once daily    [provider]  KLOR-CON 10 10 MEQ tablet Take 8 tablets (80 meq) by mouth once daily 07/17/16   [name], MD  losartan-hydrochlorothiazide (HYZAAR) 50-12.5 MG tablet Take 1 tablet by mouth daily. 10/31/16   [name], MD  Magnesium Oxide 250 MG TABS Take 1 tablet (250 mg total) by mouth 3 (three) times daily. 07/17/16   [name], MD  Multiple Vitamin (MULTIVITAMIN) tablet Take 1 tablet by mouth daily.      [provider]  torsemide (DEMADEX) 20 MG tablet Take 2 tablets (40 mg total) by mouth daily. 07/17/16   [name], MD   Family History Family History  Problem Relation Age of Onset  . Heart disease Sister   . Uterine cancer Sister   . Obesity Sister        500+ lbs  . Heart disease Brother   . Heart disease Sister   . Heart disease Father   . Colon cancer Father 14  . Crohn's disease Paternal Grandmother    Social History Social History  Substance Use Topics  . Smoking status: Former Smoker    Types: Cigarettes    Quit date: 04/10/1977  . Smokeless tobacco: Never Used  . Alcohol use Yes     Comment: occasional   Allergies   Patient has no known allergies.  Review of Systems Review of Systems  Constitutional: Positive for unexpected weight change. Negative for chills and fever.  HENT: Negative for ear pain and sore throat.   Eyes: Negative for pain and visual disturbance.  Respiratory: Positive for shortness of breath. Negative for cough.   Cardiovascular: Positive for leg swelling. Negative for chest pain and palpitations.  Gastrointestinal: Positive for abdominal distention. Negative for abdominal pain,  constipation, diarrhea and vomiting.  Genitourinary: Negative for dysuria and hematuria.  Musculoskeletal: Negative for arthralgias and back pain.  Skin: Positive for rash. Negative for color change.  Allergic/Immunologic: Negative for immunocompromised state.  Neurological: Negative for seizures and syncope.  Hematological: Negative.   Psychiatric/Behavioral: Negative.   All other systems reviewed and are negative.  Physical Exam Updated Vital Signs BP (!) 141/55 (BP Location: Left Arm)   Pulse (!) 59   Temp 98 F (36.7 C) (Oral)   Resp 18   Ht 5\' 10"  (1.778 m)   Wt (!) 172.8 kg (381 lb)   SpO2 91%   BMI 54.67 kg/m   Physical Exam  Constitutional: He is oriented to person, place, and time. He appears well-developed and well-nourished.  Morbidly obese man in no acute distress  HENT:  Head: Normocephalic and atraumatic.  Mouth/Throat: Oropharynx is clear and moist.  Eyes: Conjunctivae and EOM are normal.  Neck: Neck supple.  Cardiovascular: Normal rate, regular rhythm and intact distal pulses.   Murmur heard. Pulmonary/Chest: No respiratory distress. He has wheezes.  Mild tachypnea, hypoxia to 91% on room air  Abdominal: Soft. He exhibits no mass. There is no tenderness. There is no rebound and no guarding.  Abdomen obese with pitting edema and erythematous changes; non-tender to palpation; no focal skin lesions  Musculoskeletal: He exhibits edema (4+). He exhibits no tenderness.  Lymphadenopathy:    He has no cervical adenopathy.  Neurological: He is alert and oriented to person, place, and time.  Skin: Skin is warm and dry. Rash (raised, erythematous, blanching rash to bilateral upper and lower extremities) noted.  Psychiatric: He has a normal mood and affect. His behavior is normal. Judgment and thought content normal.  Nursing note and vitals reviewed.  ED Treatments / Results  Labs (all labs ordered are listed, but only abnormal results are displayed) Labs Reviewed   BASIC METABOLIC PANEL - Abnormal; Notable for the following:       Result Value   Sodium 134 (*)    Chloride 96 (*)    Glucose, Bld 161 (*)    All other components within normal limits  CBC - Abnormal; Notable for the following:    RBC 3.96 (*)    Hemoglobin 12.9 (*)    MCV 101.0 (*)    All other components within normal limits  PROTIME-INR - Abnormal; Notable for the following:    Prothrombin Time 28.2 (*)    All other components within normal limits  BRAIN NATRIURETIC PEPTIDE  URINALYSIS, ROUTINE W REFLEX MICROSCOPIC  MAGNESIUM  I-STAT TROPONIN, ED   EKG  EKG Interpretation None      Radiology Dg Chest 2 View  Result Date: 01/17/2017 CLINICAL DATA:  Cough, abdominal swelling, shortness of breath for 1 month EXAM: CHEST  2 VIEW COMPARISON:  05/05/2009 FINDINGS: Left tool lead pacer remains in place, unchanged. There is cardiomegaly with vascular congestion and interstitial prominence throughout the lungs, possibly interstitial edema. No effusions. No acute bony abnormality. IMPRESSION: Cardiomegaly with vascular congestion and increasing interstitial prominence concerning for interstitial edema/CHF. Electronically Signed   By: Rolm Baptise M.D.   On: 01/17/2017 17:59   Procedures Procedures (including critical care time)  Medications Ordered in ED Medications - No data to display   Initial Impression / Assessment and Plan / ED Course  I have reviewed the triage vital signs and the nursing notes.  Pertinent labs & imaging results that were available during my care of the patient were reviewed by me and considered in my medical decision making (see chart for details).    Initial differential diagnosis included heart failure exacerbation, structural cardiac disease, ACS, pneumonia, pleural effusion, SJS/TEN, viral exanthem, allergic reaction, contact dermatitis, cellulitis, paniculitis, and nephrotic syndrome.  Pertinent labs included CBC with a leukocytosis or significant  anemia.  BMP grossly unremarkable.  BNP normal.  Initial troponin negative.  EKG with dual AV pacing.  Normal heart rate (60bpm) with normal PR interval, wide QRS, and mildly prolonged QTc.  No evidence of arrhythmia, ischemia, or infarct.  No significant changes when compared to EKG from 07/17/16.  UA and magnesium pending.  Imaging studies included a chest x-ray notable for cardiomegaly, vascular congestion, and interstitial edema.  The patient was given IV lasix to initiate diuresis.  Based on the above findings, I suspect the patient is likely  suffering from a heart failure exacerbation at this time.  I believe he may benefit significantly from an echocardiogram and continue diuresis. It is possible that his BNP may be falsely normal the setting of morbid obesity.  The patient's rash appears consistent with a contact dermatitis.  I have a low suspicion for TEN or SJS in the absence of bullae and mucosal involvement.  The patient may have an early cellulitis on his abdomen, however no evidence of abscess or fluctuant mass currently.    The patient was admitted to the Hospitalist service for further evaluation and treatment.  The patient was in stable condition at the time of admission.  Final Clinical Impressions(s) / ED Diagnoses   Final diagnoses:  Acute pulmonary edema (HCC)  Pedal edema  Abdominal swelling  Rash  Hypoxia   New Prescriptions New Prescriptions   No medications on file     Charisse March, MD 01/18/17 5400    Lajean Saver, MD 01/19/17 1253

## 2017-01-17 NOTE — Patient Instructions (Signed)
If you are age 69 or older, your body mass index should be between 23-30. Your Body mass index is 54.74 kg/m. If this is out of the aforementioned range listed, please consider follow up with your Primary Care Provider.  If you are age 47 or younger, your body mass index should be between 19-25. Your Body mass index is 54.74 kg/m. If this is out of the aformentioned range listed, please consider follow up with your Primary Care Provider.   Please go to the ER at [facility].  Thank you for choosing me and [facility].   Tye Savoy, NP

## 2017-01-18 ENCOUNTER — Other Ambulatory Visit: Payer: Self-pay | Admitting: Physician Assistant

## 2017-01-18 ENCOUNTER — Encounter (HOSPITAL_COMMUNITY): Payer: Self-pay | Admitting: Internal Medicine

## 2017-01-18 ENCOUNTER — Inpatient Hospital Stay (HOSPITAL_COMMUNITY): Payer: Medicare Other

## 2017-01-18 DIAGNOSIS — Z7982 Long term (current) use of aspirin: Secondary | ICD-10-CM | POA: Diagnosis not present

## 2017-01-18 DIAGNOSIS — I509 Heart failure, unspecified: Secondary | ICD-10-CM

## 2017-01-18 DIAGNOSIS — I5033 Acute on chronic diastolic (congestive) heart failure: Secondary | ICD-10-CM

## 2017-01-18 DIAGNOSIS — Z8673 Personal history of transient ischemic attack (TIA), and cerebral infarction without residual deficits: Secondary | ICD-10-CM | POA: Diagnosis not present

## 2017-01-18 DIAGNOSIS — I5043 Acute on chronic combined systolic (congestive) and diastolic (congestive) heart failure: Secondary | ICD-10-CM | POA: Diagnosis present

## 2017-01-18 DIAGNOSIS — I4891 Unspecified atrial fibrillation: Secondary | ICD-10-CM | POA: Diagnosis not present

## 2017-01-18 DIAGNOSIS — R21 Rash and other nonspecific skin eruption: Secondary | ICD-10-CM | POA: Diagnosis not present

## 2017-01-18 DIAGNOSIS — I110 Hypertensive heart disease with heart failure: Secondary | ICD-10-CM | POA: Diagnosis present

## 2017-01-18 DIAGNOSIS — Z950 Presence of cardiac pacemaker: Secondary | ICD-10-CM | POA: Diagnosis not present

## 2017-01-18 DIAGNOSIS — I480 Paroxysmal atrial fibrillation: Secondary | ICD-10-CM | POA: Diagnosis present

## 2017-01-18 DIAGNOSIS — I5042 Chronic combined systolic (congestive) and diastolic (congestive) heart failure: Secondary | ICD-10-CM | POA: Diagnosis present

## 2017-01-18 DIAGNOSIS — Z87891 Personal history of nicotine dependence: Secondary | ICD-10-CM | POA: Diagnosis not present

## 2017-01-18 DIAGNOSIS — E876 Hypokalemia: Secondary | ICD-10-CM | POA: Diagnosis present

## 2017-01-18 DIAGNOSIS — Z9889 Other specified postprocedural states: Secondary | ICD-10-CM | POA: Diagnosis not present

## 2017-01-18 DIAGNOSIS — I10 Essential (primary) hypertension: Secondary | ICD-10-CM | POA: Diagnosis not present

## 2017-01-18 DIAGNOSIS — I421 Obstructive hypertrophic cardiomyopathy: Secondary | ICD-10-CM | POA: Diagnosis not present

## 2017-01-18 DIAGNOSIS — F419 Anxiety disorder, unspecified: Secondary | ICD-10-CM | POA: Diagnosis present

## 2017-01-18 DIAGNOSIS — Z6841 Body Mass Index (BMI) 40.0 and over, adult: Secondary | ICD-10-CM | POA: Diagnosis not present

## 2017-01-18 DIAGNOSIS — Z952 Presence of prosthetic heart valve: Secondary | ICD-10-CM | POA: Diagnosis not present

## 2017-01-18 DIAGNOSIS — M7989 Other specified soft tissue disorders: Secondary | ICD-10-CM | POA: Diagnosis not present

## 2017-01-18 DIAGNOSIS — Z7901 Long term (current) use of anticoagulants: Secondary | ICD-10-CM | POA: Diagnosis not present

## 2017-01-18 DIAGNOSIS — J810 Acute pulmonary edema: Secondary | ICD-10-CM | POA: Diagnosis not present

## 2017-01-18 DIAGNOSIS — Z79899 Other long term (current) drug therapy: Secondary | ICD-10-CM | POA: Diagnosis not present

## 2017-01-18 DIAGNOSIS — I361 Nonrheumatic tricuspid (valve) insufficiency: Secondary | ICD-10-CM | POA: Diagnosis not present

## 2017-01-18 LAB — URINALYSIS, ROUTINE W REFLEX MICROSCOPIC
Bilirubin Urine: NEGATIVE
Glucose, UA: NEGATIVE mg/dL
Hgb urine dipstick: NEGATIVE
Ketones, ur: NEGATIVE mg/dL
NITRITE: NEGATIVE
PH: 6 (ref 5.0–8.0)
Protein, ur: NEGATIVE mg/dL
SPECIFIC GRAVITY, URINE: 1.01 (ref 1.005–1.030)
SQUAMOUS EPITHELIAL / LPF: NONE SEEN

## 2017-01-18 LAB — CBC
HCT: 39.7 % (ref 39.0–52.0)
HEMOGLOBIN: 12.4 g/dL — AB (ref 13.0–17.0)
MCH: 31.6 pg (ref 26.0–34.0)
MCHC: 31.2 g/dL (ref 30.0–36.0)
MCV: 101.3 fL — ABNORMAL HIGH (ref 78.0–100.0)
Platelets: 233 10*3/uL (ref 150–400)
RBC: 3.92 MIL/uL — ABNORMAL LOW (ref 4.22–5.81)
RDW: 15.3 % (ref 11.5–15.5)
WBC: 8.8 10*3/uL (ref 4.0–10.5)

## 2017-01-18 LAB — ECHOCARDIOGRAM COMPLETE
Height: 72 in
WEIGHTICAEL: 6036.8 [oz_av]

## 2017-01-18 LAB — HEPATIC FUNCTION PANEL
ALBUMIN: 3.8 g/dL (ref 3.5–5.0)
ALK PHOS: 77 U/L (ref 38–126)
ALT: 15 U/L — ABNORMAL LOW (ref 17–63)
AST: 30 U/L (ref 15–41)
Bilirubin, Direct: 0.3 mg/dL (ref 0.1–0.5)
Indirect Bilirubin: 1.1 mg/dL — ABNORMAL HIGH (ref 0.3–0.9)
Total Bilirubin: 1.4 mg/dL — ABNORMAL HIGH (ref 0.3–1.2)
Total Protein: 7.3 g/dL (ref 6.5–8.1)

## 2017-01-18 LAB — TSH: TSH: 3.296 u[IU]/mL (ref 0.350–4.500)

## 2017-01-18 LAB — PROTIME-INR
INR: 2.48
PROTHROMBIN TIME: 26.6 s — AB (ref 11.4–15.2)

## 2017-01-18 LAB — MAGNESIUM
Magnesium: 2.1 mg/dL (ref 1.7–2.4)
Magnesium: 2.2 mg/dL (ref 1.7–2.4)

## 2017-01-18 LAB — SEDIMENTATION RATE: Sed Rate: 12 mm/hr (ref 0–16)

## 2017-01-18 MED ORDER — POTASSIUM CHLORIDE CRYS ER 10 MEQ PO TBCR
80.0000 meq | EXTENDED_RELEASE_TABLET | Freq: Every morning | ORAL | Status: DC
  Administered 2017-01-18 – 2017-01-20 (×3): 80 meq via ORAL
  Filled 2017-01-18: qty 8
  Filled 2017-01-18: qty 4
  Filled 2017-01-18 (×2): qty 8

## 2017-01-18 MED ORDER — HYDROCHLOROTHIAZIDE 12.5 MG PO CAPS
12.5000 mg | ORAL_CAPSULE | Freq: Every day | ORAL | Status: DC
  Administered 2017-01-18 – 2017-01-20 (×3): 12.5 mg via ORAL
  Filled 2017-01-18 (×3): qty 1

## 2017-01-18 MED ORDER — LOSARTAN POTASSIUM-HCTZ 50-12.5 MG PO TABS: 1.0000 | ORAL_TABLET | Freq: Every day | ORAL | Status: DC

## 2017-01-18 MED ORDER — ASPIRIN EC 81 MG PO TBEC
81.0000 mg | DELAYED_RELEASE_TABLET | Freq: Every day | ORAL | Status: DC
  Administered 2017-01-18 – 2017-01-21 (×4): 81 mg via ORAL
  Filled 2017-01-18 (×5): qty 1

## 2017-01-18 MED ORDER — WARFARIN SODIUM 7.5 MG PO TABS: 15.0000 mg | ORAL_TABLET | ORAL | Status: DC

## 2017-01-18 MED ORDER — ACETAMINOPHEN 650 MG RE SUPP: 650.0000 mg | Freq: Four times a day (QID) | RECTAL | Status: DC | PRN

## 2017-01-18 MED ORDER — LOSARTAN POTASSIUM 50 MG PO TABS
50.0000 mg | ORAL_TABLET | Freq: Every day | ORAL | Status: DC
  Administered 2017-01-18 – 2017-01-20 (×3): 50 mg via ORAL
  Filled 2017-01-18 (×4): qty 1

## 2017-01-18 MED ORDER — FUROSEMIDE 10 MG/ML IJ SOLN
80.0000 mg | Freq: Two times a day (BID) | INTRAMUSCULAR | Status: DC
  Administered 2017-01-18 – 2017-01-20 (×5): 80 mg via INTRAVENOUS
  Filled 2017-01-18 (×5): qty 8

## 2017-01-18 MED ORDER — MAGNESIUM OXIDE 400 (241.3 MG) MG PO TABS
200.0000 mg | ORAL_TABLET | Freq: Three times a day (TID) | ORAL | Status: DC
  Administered 2017-01-18 – 2017-01-21 (×10): 200 mg via ORAL
  Filled 2017-01-18 (×9): qty 1

## 2017-01-18 MED ORDER — ONDANSETRON HCL 4 MG PO TABS: 4.0000 mg | ORAL_TABLET | Freq: Four times a day (QID) | ORAL | Status: DC | PRN

## 2017-01-18 MED ORDER — WARFARIN SODIUM 10 MG PO TABS
10.0000 mg | ORAL_TABLET | ORAL | Status: DC
  Administered 2017-01-18: 10 mg via ORAL
  Filled 2017-01-18: qty 1

## 2017-01-18 MED ORDER — VITAMIN C 500 MG PO TABS
2000.0000 mg | ORAL_TABLET | Freq: Every day | ORAL | Status: DC
  Administered 2017-01-18 – 2017-01-21 (×4): 2000 mg via ORAL
  Filled 2017-01-18 (×5): qty 4

## 2017-01-18 MED ORDER — ONDANSETRON HCL 4 MG/2ML IJ SOLN: 4.0000 mg | Freq: Four times a day (QID) | INTRAMUSCULAR | Status: DC | PRN

## 2017-01-18 MED ORDER — ACETAMINOPHEN 325 MG PO TABS: 650.0000 mg | ORAL_TABLET | Freq: Four times a day (QID) | ORAL | Status: DC | PRN

## 2017-01-18 MED ORDER — OMEGA-3-ACID ETHYL ESTERS 1 G PO CAPS
4.0000 g | ORAL_CAPSULE | Freq: Every morning | ORAL | Status: DC
  Administered 2017-01-18 – 2017-01-21 (×4): 4 g via ORAL
  Filled 2017-01-18 (×5): qty 4

## 2017-01-18 MED ORDER — PERFLUTREN LIPID MICROSPHERE
4.0000 mL | INTRAVENOUS | Status: AC | PRN
  Administered 2017-01-18: 4 mL via INTRAVENOUS
  Filled 2017-01-18: qty 4

## 2017-01-18 MED ORDER — ADULT MULTIVITAMIN W/MINERALS CH
1.0000 | ORAL_TABLET | Freq: Every day | ORAL | Status: DC
  Administered 2017-01-18 – 2017-01-21 (×4): 1 via ORAL
  Filled 2017-01-18 (×4): qty 1

## 2017-01-18 MED ORDER — WARFARIN - PHARMACIST DOSING INPATIENT: Freq: Every day | Status: DC

## 2017-01-18 NOTE — H&P (Addendum)
History and Physical    Frank [name] GBT:[id number] DOB: 1945/07/12 DOA: 01/17/2017  PCP: Patient, No Pcp Per  Patient coming from: Home.  Chief Complaint: Peripheral edema and rash.  HPI: Frank [name] is a 71 y.o. 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 Patient just returned back from [place]. States he has been experiencing rash which started off in his left upper extremity then to his right upper extremity and now spread to his abdomen and lower extremity. The rash is itching and nontender. Blanches on pressure. Patient states he noticed an insect bite 3 weeks ago. Otherwise denies any new detergent soaps or cologne. Patient did change Valsartan to losartan last week but the rash has been there even before the change.  Patient states over the last 1 month has been having progressive lower extremities edema with some shortness of breath but denies any chest pain or productive cough fever or chills. Patient states he has been compliant with his Demadex. Since his abdominal girth has increased he had gone to gastroenterologist who referred patient to the ER.  ED Course: In the ER chest x-ray shows cardiomegaly and features concerning for CHF. On exam patient has significant edema extending up to his thigh and abdomen. Patient also has blanching rash on his extremities and abdomen. Patient states the rash was intensely itching before when he started 3 weeks ago now itching is largely subsided.  Review of Systems: As per HPI, rest all negative.   Past Medical History:  Diagnosis Date  . Anxiety     . Atrial arrhythmia    status post ablation in [place] with complication including damage to the aortic valve  . Atrial fibrillation ([place])   . AV block, 1st degree   . Bradycardia   . Diastolic CHF, chronic ([place])   . Diverticulosis   . History of melanoma   . HTN (hypertension)   . Hypertrophic cardiomyopathy ([place])    s/p myoectomy  . Internal hemorrhoids   . mitral valve replacement-St. Jude's mechanical   . Morbid obesity ([place])   . Pacemaker  -SJM   . S/P aortic valve repair   . TIA (transient ischemic attack) 2001  . Tubulovillous adenoma     Past Surgical History:  Procedure Laterality Date  . AORTIC VALVE REPLACEMENT     mechanical-following traumatic injury during an ablation procedure  . COLONOSCOPY    . COLONOSCOPY WITH PROPOFOL N/A 04/13/2016   Procedure: COLONOSCOPY WITH PROPOFOL;  Surgeon: Gatha Mayer, MD;  Location: [facility];  Service: Endoscopy;  Laterality: N/A;  . MITRAL VALVE REPLACEMENT     mechanical  . PACEMAKER INSERTION     St Jude Accent   . septal myectomy       reports that he quit smoking about 39 years ago. His smoking use included Cigarettes. He has never used smokeless tobacco. He reports that he drinks alcohol. He reports that he does not use drugs.  Allergies  Allergen Reactions  . Warfarin And Related Other (See Comments)    Per the patient's wife, "Warfain made him have a TIA" CAN TAKE BRAND NAME COUMADIN     Family  History  Problem Relation Age of Onset  . Heart disease Sister   . Uterine cancer Sister   . Obesity Sister        500+ lbs  . Heart disease Brother   . Heart disease Sister   . Heart disease Father   . Colon cancer Father 43  . Crohn's disease Paternal Grandmother     Prior to Admission medications   Medication Sig Start Date End Date Taking? Authorizing Provider  Ascorbic Acid (VITAMIN C) 1000 MG tablet Take 2,000 mg by mouth daily.     Yes [provider]  aspirin 81 MG tablet Take 81 mg by mouth  daily.   Yes [provider]  COUMADIN 10 MG tablet TAKE AS DIRECTED BY COUMADIN CLINIC Patient taking differently: Take 10 mg by mouth in the morning on Sun/Mon/Tues/Thurs/Fri/Sat and 15 mg on Wed 01/17/17  Yes [name], MD  fish oil-omega-3 fatty acids 1000 MG capsule Take 4 g by mouth every morning.    Yes [provider]  KLOR-CON 10 10 MEQ tablet Take 8 tablets (80 meq) by mouth once daily Patient taking differently: Take 80 mEq by mouth every morning. [place]: [id number] 07/17/16  Yes [name], MD  losartan-hydrochlorothiazide [facility]) 50-12.5 MG tablet Take 1 tablet by mouth daily. 10/31/16  Yes [name], MD  Magnesium Oxide 250 MG TABS Take 1 tablet (250 mg total) by mouth 3 (three) times daily. 07/17/16  Yes [name], MD  Multiple Vitamin (MULTIVITAMIN) tablet Take 1 tablet by mouth daily.     Yes [provider]  torsemide (DEMADEX) 20 MG tablet Take 2 tablets (40 mg total) by mouth daily. Patient taking differently: Take 40 mg by mouth every morning.  07/17/16  Yes [name], MD    Physical Exam: Vitals:   01/17/17 2300 01/18/17 0030 01/18/17 0045 01/18/17 0143  BP: 129/73 138/65  138/78  Pulse: 61  71 60  Resp: 17  19 19   Temp:    98.4 F (36.9 C)  TempSrc:    Oral  SpO2: 95%  93% 94%  Weight:    (!) 171.1 kg (377 lb 4.8 oz)  Height:    6' (1.829 m)      Constitutional: Moderately built and nourished. Vitals:   01/17/17 2300 01/18/17 0030 01/18/17 0045 01/18/17 0143  BP: 129/73 138/65  138/78  Pulse: 61  71 60  Resp: 17  19 19   Temp:    98.4 F (36.9 C)  TempSrc:    Oral  SpO2: 95%  93% 94%  Weight:    (!) 171.1 kg (377 lb 4.8 oz)  Height:    6' (1.829 m)   Eyes: Anicteric no pallor. ENMT: No discharge from the ears eyes nose or mouth. Neck: JVD elevated no mass felt. Respiratory: No rhonchi or crepitations. Cardiovascular: S1-S2 heard no murmurs appreciated. Abdomen: Distended nontender bowel sounds  present. Musculoskeletal: Bilateral lower showed edema extending up to the thighs. Skin: Diffuse rash involving the upper extremities abdomen and lower extremities. Blanching. Neurologic: Alert awake oriented to time place and person. Moves all extremities. Psychiatric: Appears normal. Normal affect.   Labs on Admission: I have personally reviewed following labs and imaging studies  CBC:  Recent Labs Lab 01/17/17 1706  WBC 8.8  HGB 12.9*  HCT 40.0  MCV 101.0*  PLT 854   Basic Metabolic Panel:  Recent Labs Lab 01/17/17 1706  NA 134*  K 4.0  CL 96*  CO2 28  GLUCOSE 161*  BUN 19  CREATININE 1.03  CALCIUM 8.9  MG 2.1   GFR: Estimated Creatinine Clearance: 111.7 mL/min (by C-G formula based on SCr of 1.03 mg/dL). Liver Function Tests: No results for input(s): AST, ALT, ALKPHOS, BILITOT, PROT, ALBUMIN in the last 168 hours. No results for input(s): LIPASE, AMYLASE in the last 168 hours. No results for input(s): AMMONIA in the last 168 hours. Coagulation Profile:  Recent Labs Lab 01/17/17 1706  INR 2.67   Cardiac Enzymes: No results for input(s): CKTOTAL, CKMB, CKMBINDEX, TROPONINI in the last 168 hours. BNP (last 3 results) No results for input(s): PROBNP in the last 8760 hours. HbA1C: No results for input(s): HGBA1C in the last 72 hours. CBG: No results for input(s): GLUCAP in the last 168 hours. Lipid Profile: No results for input(s): CHOL, HDL, LDLCALC, TRIG, CHOLHDL, LDLDIRECT in the last 72 hours. Thyroid Function Tests: No results for input(s): TSH, T4TOTAL, FREET4, T3FREE, THYROIDAB in the last 72 hours. Anemia Panel: No results for input(s): VITAMINB12, FOLATE, FERRITIN, TIBC, IRON, RETICCTPCT in the last 72 hours. Urine analysis:    Component Value Date/Time   COLORURINE YELLOW 01/18/2017 0018   APPEARANCEUR CLEAR 01/18/2017 0018   LABSPEC 1.010 01/18/2017 0018   PHURINE 6.0 01/18/2017 0018   GLUCOSEU NEGATIVE 01/18/2017 0018   HGBUR NEGATIVE  01/18/2017 0018   BILIRUBINUR NEGATIVE 01/18/2017 0018   KETONESUR NEGATIVE 01/18/2017 0018   PROTEINUR NEGATIVE 01/18/2017 0018   NITRITE NEGATIVE 01/18/2017 0018   LEUKOCYTESUR MODERATE (A) 01/18/2017 0018   Sepsis Labs: @LABRCNTIP (procalcitonin:4,lacticidven:4) )No results found for this or any previous visit (from the past 240 hour(s)).   Radiological Exams on Admission: Dg Chest 2 View  Result Date: 01/17/2017 CLINICAL DATA:  Cough, abdominal swelling, shortness of breath for 1 month EXAM: CHEST  2 VIEW COMPARISON:  05/05/2009 FINDINGS: Left tool lead pacer remains in place, unchanged. There is cardiomegaly with vascular congestion and interstitial prominence throughout the lungs, possibly interstitial edema. No effusions. No acute bony abnormality. IMPRESSION: Cardiomegaly with vascular congestion and increasing interstitial prominence concerning for interstitial edema/CHF. Electronically Signed   By: Rolm Baptise M.D.   On: 01/17/2017 17:59    EKG: Independently reviewed. Paced rhythm.  Assessment/Plan Principal Problem:   Acute on chronic diastolic CHF (congestive heart failure) (HCC) Active Problems:   Essential hypertension   Hypertrophic obstructive cardiomyopathy (HCC)   Atrial fibrillation/tachycardia   S/P aortic valve repair   S/P mitral valve replacement-Mechanical   Cardiac pacemaker-St.Jude   CHF (congestive heart failure) ([place])    1. Acute on chronic diastolic CHF last EF measured was in April 2017 was 55-60% - I have placed patient on Lasix 80 mg IV every 12. Patient may need more increased dose but will follow the response. Close the following take off for daily weights and metabolic panel. Recheck 2-D echo. 2. Diffuse rash - likely to be allergic since patient also has itching. Does not look vasculitis. Patient does not have any oral lesion or tenderness on the lesions. So less likely to be Stevens-Johnson's. Will check ANA sedimentation rate Lyme titers and  RMSF. Patient probably may need biopsy of rash persists. Also may benefit from steroids. 3. History of mechanical mitral valve and aortic valve repair status post pacemaker placement - on Coumadin. Pharmacy to dose. 4. History of myomectomy for hypertrophic obstructive cardiomyopathy. 5. History of tachycardia and A. fib status post ablation. Presently on Coumadin. 6. Hypertension on losartan and hydrochlorothiazide. 7. Possible UTI -  patient is asymptomatic so I didn't start antibiotics. Will check urine cultures. 8. Morbid obesity.   DVT prophylaxis: Coumadin. Code Status: Full code.  Family Communication: Patient's wife.  Disposition Plan: Home.  Consults called: None.  Admission status: Inpatient.    Rise Patience MD [facility] Pager [phone].  If 7PM-7AM, please contact night-coverage [email or web address] Password [email or web address]  01/18/2017, 2:48 AM

## 2017-01-18 NOTE — Progress Notes (Signed)
Patient ID: Frank [name], male   DOB: [birth year], 71 y.o.   MRN: [id number] Patient was admitted early this morning for acute decompensated CHF. His prior medical records and this morning's H&P was reviewed by myself. I saw him and examined him at bedside and discussed the plan of care with him. Continue intravenous Lasix for now. I have consulted cardiology. We will wait for 2-D echo. He has an outpatient follow-up with dermatology for his rash, which he thinks is getting better.

## 2017-01-18 NOTE — Plan of Care (Signed)
Problem: Pain Managment: Goal: General experience of comfort will improve Outcome: Progressing No c/o pain at this time.

## 2017-01-18 NOTE — Consult Note (Signed)
Cardiology Consultation:   Patient ID: Frank [name]; [id number]; [birth year]   Admit date: 01/17/2017 Date of Consult: 01/18/2017  Primary Care Provider: Patient, No Pcp Per Primary Cardiologist: Dr [name] saw once 2011  Primary Electrophysiologist:  Dr [name] 07/17/2016   Patient Profile:   Frank [name] is a 71 y.o. male with a hx of HCOM s/p myectomy w/ mech MVR and AoV repair, STJ PPM for bradycardia, PAF, chronic coumadin, hypoxemia w/ Dr [name] planning for pt to see Pulm MD, D-CHF  who is being seen today for the evaluation of CHF at the request of Dr Starla Link.  History of Present Illness:   Frank [name] does not have a PCP. He has struggled getting one because he is on Medicare. He called around at one point and was told he could get an appointment in a year and a half. He was having problems with a rash x 1 month, abd bloating, minor rectal bleed and increased SOB. He saw GI for the abdominal sx, wt was noted to be up almost 30 lbs>>ER.   He had a contract with [employer] this summer and was back and forth to [place]. His weight was stable but he did not feel well when it ended in July. He admits to eating poorly when in [place].  Pt was weighing at home and had been steady. About 355 lbs. He tries to stay below 1000 mg sodium daily.   He went to [place], for his 50th HS reunion and family visit. Then he went up [place] to a family farm in [place]. Pt had been in [place] for about a week before the rash started. He was already having problems w/ increased DOE. He had a bug bite, but the rash did not start around that. The rash spread and was itchy at first. He thought it would get better.   His abdomen felt hard and leathery, but was not swollen. However, his pants got tighter and tighter. His pants were so uncomfortable he was wearing sweat pants. He also noticed increased LE edema. He admits that his diet was higher in sodium and was eating out a great deal. He was  having orthopnea, PND and increased DOE. The sx worsened and he came home early.   He has been responding well to the IV Lasix. His weight was down 4 lbs overnight and he has been urinating a great deal today. He is already breathing better.  He was able to lose weight after his valve surgery by limiting calories to 1500/day and limiting Na to 1000 mg/day with exercise. He feels he could do this again.    Past Medical History:  Diagnosis Date  . Anxiety   . Atrial arrhythmia    status post ablation in [place] with complication including damage to the aortic valve  . Atrial fibrillation ([place])   . AV block, 1st degree   . Bradycardia   . Diastolic CHF, chronic ([place])   . Diverticulosis   . History of melanoma   . HTN (hypertension)   . Hypertrophic cardiomyopathy ([place])    s/p myoectomy  . Internal hemorrhoids   . mitral valve replacement-St. Jude's mechanical   . Morbid obesity ([place])   . Pacemaker  -SJM   . S/P aortic valve repair   . TIA (transient ischemic attack) 2001  . Tubulovillous adenoma     Past Surgical History:  Procedure Laterality Date  . AORTIC VALVE REPLACEMENT     mechanical-following traumatic injury  during an ablation procedure  . COLONOSCOPY    . COLONOSCOPY WITH PROPOFOL N/A 04/13/2016   Procedure: COLONOSCOPY WITH PROPOFOL;  Surgeon: Gatha Mayer, MD;  [facility];  Service: Endoscopy;  Laterality: N/A;  . MITRAL VALVE REPLACEMENT     mechanical  . PACEMAKER INSERTION     St Jude Accent   . septal myectomy       Inpatient Medications: Scheduled Meds: . aspirin EC  81 mg Oral Daily  . furosemide  80 mg Intravenous Q12H  . hydrochlorothiazide  12.5 mg Oral Daily  . losartan  50 mg Oral Daily  . magnesium oxide  200 mg Oral TID  . multivitamin with minerals  1 tablet Oral Daily  . omega-3 acid ethyl esters  4 g Oral q morning - 10a  . potassium chloride  80 mEq Oral q morning - 10a  . vitamin C  2,000 mg Oral Daily  . warfarin  10 mg  Oral Once per day on Sun Mon Tue Thu Fri Sat  . [START ON 01/24/2017] warfarin  15 mg Oral Q Wed-1800  . Warfarin - Pharmacist Dosing Inpatient   Does not apply q1800   Continuous Infusions:  PRN Meds: acetaminophen **OR** acetaminophen, ondansetron **OR** ondansetron (ZOFRAN) IV Prior to Admission medications   Medication Sig Start Date End Date Taking? Authorizing Provider  Ascorbic Acid (VITAMIN C) 1000 MG tablet Take 2,000 mg by mouth daily.     Yes [provider]  aspirin 81 MG tablet Take 81 mg by mouth daily.   Yes [provider]  COUMADIN 10 MG tablet TAKE AS DIRECTED BY COUMADIN CLINIC Patient taking differently: Take 10 mg by mouth in the morning on Sun/Mon/Tues/Thurs/Fri/Sat and 15 mg on Wed 01/17/17  Yes [name], MD  fish oil-omega-3 fatty acids 1000 MG capsule Take 4 g by mouth every morning.    Yes [provider]  KLOR-CON 10 10 MEQ tablet Take 8 tablets (80 meq) by mouth once daily Patient taking differently: Take 80 mEq by mouth every morning. [place]: [id number] 07/17/16  Yes [name], MD  losartan-hydrochlorothiazide [facility]) 50-12.5 MG tablet Take 1 tablet by mouth daily. 10/31/16  Yes [name], MD  Magnesium Oxide 250 MG TABS Take 1 tablet (250 mg total) by mouth 3 (three) times daily. 07/17/16  Yes [name], MD  Multiple Vitamin (MULTIVITAMIN) tablet Take 1 tablet by mouth daily.     Yes [provider]  torsemide (DEMADEX) 20 MG tablet Take 2 tablets (40 mg total) by mouth daily. Patient taking differently: Take 40 mg by mouth every morning.  07/17/16  Yes [name], MD    Allergies:    Allergies  Allergen Reactions  . Warfarin And Related Other (See Comments)    Per the patient's wife, "Warfain made him have a TIA" CAN TAKE BRAND NAME COUMADIN     Social History:   Social History   Social History  . Marital status: Married    Spouse name: N/A  . Number of children: 1  . Years of education:  N/A   Occupational History  . Corporate investment banker, retired Production designer, theatre/television/film [employer]   Social History Main Topics  . Smoking status: Former Smoker    Types: Cigarettes    Quit date: 04/10/1977  . Smokeless tobacco: Never Used  . Alcohol use Yes     Comment: occasional  . Drug use: No  . Sexual activity: Not on file  Other Topics Concern  . Not on file   Social History Narrative   Married, one adopted son and one daughter. 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   2 caffeinated beverages daily    Family History:   The patient's family history includes Colon cancer (age of onset: 61) in his father; Crohn's disease in his paternal grandmother; Heart disease in his brother, father, sister, and sister; Obesity in his sister; Uterine cancer in his sister. Pt indicated that his mother is alive. He indicated that the status of his father is unknown. He indicated that only one of his two sisters is alive. He indicated that his brother is alive. He indicated that his maternal grandmother is deceased. He indicated that his maternal grandfather is deceased. He indicated that his paternal grandmother is deceased. He indicated that his paternal grandfather is deceased.    ROS:  Please see the history of present illness.  All other ROS reviewed and negative.      Physical Exam/Data:   Vitals:   01/18/17 0045 01/18/17 0143 01/18/17 0450 01/18/17 1141  BP:  138/78 (!) 147/54 (!) 160/62  Pulse: 71 60 63 (!) 59  Resp: 19 19 18 18   Temp:  98.4 F (36.9 C) 97.9 F (36.6 C) 98 F (36.7 C)  TempSrc:  Oral Oral Oral  SpO2: 93% 94% 94% 94%  Weight:  (!) 377 lb 4.8 oz (171.1 kg)    Height:  6' (1.829 m)      Intake/Output Summary (Last 24 hours) at 01/18/17 1426 Last data filed at 01/18/17 1142  Gross per 24 hour  Intake              440 ml  Output             1501 ml  Net            -1061 ml   Filed Weights   01/17/17 1655 01/18/17 0143  Weight: (!)  381 lb (172.8 kg) (!) 377 lb 4.8 oz (171.1 kg)   Body mass index is 51.17 kg/m.  General:  Well nourished, well developed, in no acute distress at rest on O2 HEENT: normal Lymph: no adenopathy Neck: pronounced JVD 11 cm Endocrine:  No thryomegaly Vascular: No carotid bruits; upper extremity pulses 2+ bilaterally Cardiac:  normal S1, S2; RRR; 2/6 murmur, crisp valve click Lungs: rales bilateral bases, no wheezing, rhonchi, + rales  Abd: very obese and firm, nontender, no hepatomegaly  Ext: 2-3+ edema, R>L chronically Musculoskeletal:  No deformities, BUE and BLE strength normal and equal Skin: warm and dry  Neuro:  CNs 2-12 intact, no focal abnormalities noted Psych:  Normal affect   EKG:  The EKG was personally reviewed and demonstrates:  AV pacing Telemetry:  Telemetry was personally reviewed and demonstrates:  AV pacing  Relevant CV Studies:  ECHO: 01/18/2017, ordered   ECHO: 07/28/2015 - Left ventricle: The cavity size was normal. Wall thickness was   increased in a pattern of severe LVH. Systolic function was   normal. The estimated ejection fraction was in the range of 55%-60%. - Aortic valve: There was mild stenosis. There was mild   regurgitation. Valve area (VTI): 1.86 cm^2. Valve area (Vmax):   1.76 cm^2. Valve area (Vmean): 1.92 cm^2. - Mitral valve: Normal appearing mechanical MVR with no   perivalvular regurgitation. Valve area by pressure half-time:   2.04 cm^2. Valve area by continuity equation (using LVOT flow): 1.41  cm^2. - Left atrium: The atrium was severely dilated. - Right atrium: The atrium was moderately dilated. - Atrial septum: No defect or patent foramen ovale was identified. - Pulmonary arteries: PA peak pressure: 62 mm Hg (S).  Laboratory Data:  Chemistry  Recent Labs Lab 01/17/17 1706  NA 134*  K 4.0  CL 96*  CO2 28  GLUCOSE 161*  BUN 19  CREATININE 1.03  CALCIUM 8.9  GFRNONAA >60  GFRAA >60  ANIONGAP 10     Recent Labs Lab  01/18/17 0717  PROT 7.3  ALBUMIN 3.8  AST 30  ALT 15*  ALKPHOS 77  BILITOT 1.4*   Hematology  Recent Labs Lab 01/17/17 1706 01/18/17 0305  WBC 8.8 8.8  RBC 3.96* 3.92*  HGB 12.9* 12.4*  HCT 40.0 39.7  MCV 101.0* 101.3*  MCH 32.6 31.6  MCHC 32.3 31.2  RDW 15.1 15.3  PLT 217 233   Cardiac EnzymesNo results for input(s): TROPONINI in the last 168 hours.   Recent Labs Lab 01/17/17 1711  TROPIPOC 0.01    BNP  Recent Labs Lab 01/17/17 1706  BNP 75.4     Radiology/Studies:  Dg Chest 2 View  Result Date: 01/17/2017 CLINICAL DATA:  Cough, abdominal swelling, shortness of breath for 1 month EXAM: CHEST  2 VIEW COMPARISON:  05/05/2009 FINDINGS: Left tool lead pacer remains in place, unchanged. There is cardiomegaly with vascular congestion and interstitial prominence throughout the lungs, possibly interstitial edema. No effusions. No acute bony abnormality. IMPRESSION: Cardiomegaly with vascular congestion and increasing interstitial prominence concerning for interstitial edema/CHF. Electronically Signed   By: Rolm Baptise M.D.   On: 01/17/2017 17:59    Assessment and Plan:   Principal Problem:   Acute on chronic diastolic CHF (congestive heart failure) (HCC) - Pt weight at least 20 lbs above baseline still - diuresing well w/ IV Lasix, continue current rx - follow renal function and electrolytes - if pt continues to lose 2-3 lbs/day, no change in rx  - discussed wt loss options, he will think about a nutritionist but feels he can do what he did before and do well with it.  - will need to ambulate w/ staff to see if he qualifies for home O2 closer to d/c.  - have ordered Pulm rehab, Dr [name] agrees to sign the order. - Case manager consult to see if they can help him get a PCP.  - if pt does not diurese well w/ IV Lasix, may need CHF consult, depending on echo results.  Otherwise, per IM Active Problems:   Essential hypertension   Hypertrophic obstructive  cardiomyopathy (HCC)   Atrial fibrillation/tachycardia   S/P aortic valve repair   S/P mitral valve replacement-Mechanical   Cardiac pacemaker-St.Jude   CHF (congestive heart failure) ([name])     Signed, Barrett, Rhonda, PA-C  01/18/2017 2:26 PM   Attending Note:   The patient was seen and examined.  Agree with assessment and plan as noted above.  Changes made to the above note as needed.  Patient seen and independently examined with Rosaria Ferries, PA .   We discussed all aspects of the encounter. I agree with the assessment and plan as stated above.  1.   Acute on chronic diastolic congestive heart failure. The patient is volume overloaded. Agree with diuresis. He typically is fairly careful with his diet but has been traveling quite a bit and his been eating out quite a bit. He really needs a nutrition consult. He's diuresing nicely  on IV Lasix.  2. Morbid obesity: Needs lots of counseling regard regarding carbohydrate and overall calorie reduction.    I have spent a total of 40 minutes with patient reviewing hospital  notes , telemetry, EKGs, labs and examining patient as well as establishing an assessment and plan that was discussed with the patient. > 50% of time was spent in direct patient care.    Thayer Headings, Brooke Bonito., MD, [facility] 01/19/2017, 10:21 AM [address]  [place] Pager [phone]

## 2017-01-18 NOTE — Progress Notes (Signed)
ANTICOAGULATION CONSULT NOTE - Initial Consult  Pharmacy Consult for Coumadin Indication: AVR, Afib, h/o TIA  Allergies  Allergen Reactions  . Warfarin And Related Other (See Comments)    Per the patient's wife, "Warfain made him have a TIA" CAN TAKE BRAND NAME COUMADIN     Patient Measurements: Height: 6' (182.9 cm) Weight: (!) 377 lb 4.8 oz (171.1 kg) IBW/kg (Calculated) : 77.6  Vital Signs: Temp: 97.9 F (36.6 C) (10/11 0450) Temp Source: Oral (10/11 0450) BP: 147/54 (10/11 0450) Pulse Rate: 63 (10/11 0450)  Labs:  Recent Labs  01/17/17 1706 01/18/17 0305 01/18/17 0427  HGB 12.9* 12.4*  --   HCT 40.0 39.7  --   PLT 217 233  --   LABPROT 28.2*  --  26.6*  INR 2.67  --  2.48  CREATININE 1.03  --   --     Estimated Creatinine Clearance: 111.7 mL/min (by C-G formula based on SCr of 1.03 mg/dL).   Medical History: Past Medical History:  Diagnosis Date  . Anxiety   . Atrial arrhythmia    status post ablation in [place] with complication including damage to the aortic valve  . Atrial fibrillation ([place])   . AV block, 1st degree   . Bradycardia   . Diastolic CHF, chronic ([place])   . Diverticulosis   . History of melanoma   . HTN (hypertension)   . Hypertrophic cardiomyopathy ([place])    s/p myoectomy  . Internal hemorrhoids   . mitral valve replacement-St. Jude's mechanical   . Morbid obesity ([place])   . Pacemaker  -SJM   . S/P aortic valve repair   . TIA (transient ischemic attack) 2001  . Tubulovillous adenoma     Medications:  Prescriptions Prior to Admission  Medication Sig Dispense Refill Last Dose  . Ascorbic Acid (VITAMIN C) 1000 MG tablet Take 2,000 mg by mouth daily.     01/17/2017 at Unknown time  . aspirin 81 MG tablet Take 81 mg by mouth daily.   01/17/2017 at 0600  . COUMADIN 10 MG tablet TAKE AS DIRECTED BY COUMADIN CLINIC (Patient taking differently: Take 10 mg by mouth in the morning on Sun/Mon/Tues/Thurs/Fri/Sat and 15 mg on Wed) 100 tablet 1  01/17/2017 at 0600  . fish oil-omega-3 fatty acids 1000 MG capsule Take 4 g by mouth every morning.    01/17/2017 at am  . KLOR-CON 10 10 MEQ tablet Take 8 tablets (80 meq) by mouth once daily (Patient taking differently: Take 80 mEq by mouth every morning. NDC: [phone]) 720 tablet 3 01/17/2017 at am  . losartan-hydrochlorothiazide (HYZAAR) 50-12.5 MG tablet Take 1 tablet by mouth daily. 90 tablet 1 01/17/2017 at am  . Magnesium Oxide 250 MG TABS Take 1 tablet (250 mg total) by mouth 3 (three) times daily. 270 tablet 3 01/17/2017 at pm  . Multiple Vitamin (MULTIVITAMIN) tablet Take 1 tablet by mouth daily.     01/17/2017 at am  . torsemide (DEMADEX) 20 MG tablet Take 2 tablets (40 mg total) by mouth daily. (Patient taking differently: Take 40 mg by mouth every morning. ) 180 tablet 3 01/17/2017 at am   Scheduled:  . aspirin EC  81 mg Oral Daily  . furosemide  80 mg Intravenous Q12H  . hydrochlorothiazide  12.5 mg Oral Daily  . losartan  50 mg Oral Daily  . magnesium oxide  200 mg Oral TID  . multivitamin with minerals  1 tablet Oral Daily  . omega-3 acid ethyl esters  4 g Oral q morning - 10a  . potassium chloride SA  80 mEq Oral q morning - 10a  . vitamin C  2,000 mg Oral Daily  . warfarin  10 mg Oral Once per day on Sun Mon Tue Thu Fri Sat  . [START ON 01/24/2017] warfarin  15 mg Oral Q Wed-1800  . Warfarin - Pharmacist Dosing Inpatient   Does not apply q1800    Assessment: 71yo male c/o worsening abdominal swelling, SOB w/ exertion, and rash to BUE/torso, to continue Coumadin during admission; current INR at goal (2.67) w/ last dose taken 10/10.  INR = 2.48 today  Goal of Therapy:  INR 2.5-3.5   Plan:  -Will continue home Coumadin regimen of 10mg  MTTFSS and 15mg  Wed and monitor INR. -Take 10mg  PO today  Florinda Marker, Student Pharmacist 01/18/2017,8:27 AM

## 2017-01-18 NOTE — Progress Notes (Signed)
ANTICOAGULATION CONSULT NOTE - Initial Consult  Pharmacy Consult for Coumadin Indication: AVR, Afib, h/o TIA  Allergies  Allergen Reactions  . Warfarin And Related Other (See Comments)    Per the patient's wife, "Warfain made him have a TIA" CAN TAKE BRAND NAME COUMADIN     Patient Measurements: Height: 6' (182.9 cm) Weight: (!) 377 lb 4.8 oz (171.1 kg) IBW/kg (Calculated) : 77.6  Vital Signs: Temp: 98.4 F (36.9 C) (10/11 0143) Temp Source: Oral (10/11 0143) BP: 138/78 (10/11 0143) Pulse Rate: 60 (10/11 0143)  Labs:  Recent Labs  01/17/17 1706  HGB 12.9*  HCT 40.0  PLT 217  LABPROT 28.2*  INR 2.67  CREATININE 1.03    Estimated Creatinine Clearance: 111.7 mL/min (by C-G formula based on SCr of 1.03 mg/dL).   Medical History: Past Medical History:  Diagnosis Date  . Anxiety   . Atrial arrhythmia    status post ablation in [place] with complication including damage to the aortic valve  . Atrial fibrillation ([place])   . AV block, 1st degree   . Bradycardia   . Diastolic CHF, chronic ([place])   . Diverticulosis   . History of melanoma   . HTN (hypertension)   . Hypertrophic cardiomyopathy ([place])    s/p myoectomy  . Internal hemorrhoids   . mitral valve replacement-St. Jude's mechanical   . Morbid obesity ([place])   . Pacemaker  -SJM   . S/P aortic valve repair   . TIA (transient ischemic attack) 2001  . Tubulovillous adenoma     Medications:  Prescriptions Prior to Admission  Medication Sig Dispense Refill Last Dose  . Ascorbic Acid (VITAMIN C) 1000 MG tablet Take 2,000 mg by mouth daily.     01/17/2017 at Unknown time  . aspirin 81 MG tablet Take 81 mg by mouth daily.   01/17/2017 at 0600  . COUMADIN 10 MG tablet TAKE AS DIRECTED BY COUMADIN CLINIC (Patient taking differently: Take 10 mg by mouth in the morning on Sun/Mon/Tues/Thurs/Fri/Sat and 15 mg on Wed) 100 tablet 1 01/17/2017 at 0600  . fish oil-omega-3 fatty acids 1000 MG capsule Take 4 g by mouth  every morning.    01/17/2017 at am  . KLOR-CON 10 10 MEQ tablet Take 8 tablets (80 meq) by mouth once daily (Patient taking differently: Take 80 mEq by mouth every morning. NDC: [phone]) 720 tablet 3 01/17/2017 at am  . losartan-hydrochlorothiazide (HYZAAR) 50-12.5 MG tablet Take 1 tablet by mouth daily. 90 tablet 1 01/17/2017 at am  . Magnesium Oxide 250 MG TABS Take 1 tablet (250 mg total) by mouth 3 (three) times daily. 270 tablet 3 01/17/2017 at pm  . Multiple Vitamin (MULTIVITAMIN) tablet Take 1 tablet by mouth daily.     01/17/2017 at am  . torsemide (DEMADEX) 20 MG tablet Take 2 tablets (40 mg total) by mouth daily. (Patient taking differently: Take 40 mg by mouth every morning. ) 180 tablet 3 01/17/2017 at am   Scheduled:  . aspirin EC  81 mg Oral Daily  . furosemide  80 mg Intravenous Q12H  . hydrochlorothiazide  12.5 mg Oral Daily  . losartan  50 mg Oral Daily  . magnesium oxide  200 mg Oral TID  . multivitamin with minerals  1 tablet Oral Daily  . omega-3 acid ethyl esters  4 g Oral q morning - 10a  . potassium chloride SA  80 mEq Oral q morning - 10a  . vitamin C  2,000 mg Oral Daily  Assessment: 71yo male c/o worsening abdominal swelling, SOB w/ exertion, and rash to BUE/torso, to continue Coumadin during admission; current INR at goal w/ last dose taken 10/10.  Goal of Therapy:  INR 2.5-3.5   Plan:  Will continue home Coumadin regimen of 10mg  MTTFSS and 15mg  Wed and monitor INR.  Wynona Neat, PharmD, BCPS  01/18/2017,3:22 AM

## 2017-01-18 NOTE — Progress Notes (Signed)
  Echocardiogram 2D Echocardiogram has been performed.  Darlina Sicilian M 01/18/2017, 4:39 PM

## 2017-01-18 NOTE — Progress Notes (Signed)
Pt stated that he took a shower yesterday and he will be alright till he goes home tomorrow

## 2017-01-19 DIAGNOSIS — Z952 Presence of prosthetic heart valve: Secondary | ICD-10-CM

## 2017-01-19 DIAGNOSIS — R21 Rash and other nonspecific skin eruption: Secondary | ICD-10-CM

## 2017-01-19 LAB — BASIC METABOLIC PANEL
Anion gap: 10 (ref 5–15)
BUN: 16 mg/dL (ref 6–20)
CHLORIDE: 98 mmol/L — AB (ref 101–111)
CO2: 30 mmol/L (ref 22–32)
Calcium: 9 mg/dL (ref 8.9–10.3)
Creatinine, Ser: 0.92 mg/dL (ref 0.61–1.24)
GFR calc Af Amer: >60 mL/min (ref >60)
GFR calc non Af Amer: >60 mL/min (ref >60)
GLUCOSE: 93 mg/dL (ref 65–99)
POTASSIUM: 3.3 mmol/L — AB (ref 3.5–5.1)
Sodium: 138 mmol/L (ref 135–145)

## 2017-01-19 LAB — MAGNESIUM: Magnesium: 2.3 mg/dL (ref 1.7–2.4)

## 2017-01-19 LAB — PROTIME-INR
INR: 2.26
Prothrombin Time: 24.8 seconds — ABNORMAL HIGH (ref 11.4–15.2)

## 2017-01-19 LAB — ROCKY MTN SPOTTED FVR ABS PNL(IGG+IGM)
RMSF IGG: NEGATIVE
RMSF IgM: 0.37 index (ref 0.00–0.89)

## 2017-01-19 LAB — ANA W/REFLEX IF POSITIVE: Anti Nuclear Antibody(ANA): NEGATIVE

## 2017-01-19 LAB — B. BURGDORFI ANTIBODIES: B burgdorferi Ab IgG+IgM: <0.91 {ISR} (ref 0.00–0.90)

## 2017-01-19 MED ORDER — WARFARIN SODIUM 7.5 MG PO TABS
15.0000 mg | ORAL_TABLET | Freq: Once | ORAL | Status: AC
  Administered 2017-01-19: 15 mg via ORAL
  Filled 2017-01-19: qty 2

## 2017-01-19 NOTE — Progress Notes (Signed)
Patient ID: Frank [name], male   DOB: [birth year], 71 y.o.   MRN: [id number]  PROGRESS NOTE    Frank [name]  LNL:[id number] DOB: 21-Aug-1945 DOA: 01/17/2017 PCP: Patient, No Pcp Per   Brief Narrative:  71 year old male with history of diastolic CHF, hypertrophic obstructive cardiomyopathy status post myomectomy and mechanical mitral valve replacement and aortic valve repair with history of permanent pacemaker placement for bradycardia, paroxysmal A. fib on Coumadin presented with complaints of worsening lower extremity edema and abdominal swelling. He was admitted with CHF exacerbation and started on IV Lasix  Assessment & Plan:   Principal Problem:   Acute on chronic diastolic CHF (congestive heart failure) (HCC) Active Problems:   Essential hypertension   Hypertrophic obstructive cardiomyopathy (HCC)   Atrial fibrillation/tachycardia   S/P aortic valve repair   S/P mitral valve replacement-Mechanical   Cardiac pacemaker-St.Jude   CHF (congestive heart failure) ([place])  1. Acute on chronic diastolic CHF - EF 40-81%. Continue Lasix 80 mg IV twice a day. Cardiology following. Strict input and output and daily weights. Fluid restriction. Follow creatinine  2. Diffuse rash - likely to be allergic since patient also has itching. Does not look vasculitis. Patient states that his rash is improving. He will follow-up with dermatology as an outpatient. Will avoid oral steroids which might worsen heart failure 3. History of mechanical mitral valve replacement and aortic valve repair status post pacemaker placement - on Coumadin. Pharmacy to dose. 4. History of myomectomy for hypertrophic obstructive cardiomyopathy. 5. History of tachycardia and A. fib status post ablation: Currently rate controlled 6. Hypertension: Continue Lasix, losartan and hydrochlorothiazide. 7. Hypokalemia: Replace and repeat AM labs 8. Morbid obesity: outpatient followup    DVT prophylaxis: Coumadin Code Status:   Full Family Communication: None at bedside Disposition Plan: Home in one to 2 days  Consultants: Cardiology  Procedures:  Study Conclusions  - Left ventricle: The cavity size was normal. Septal wall thickness   was increased in a pattern of severe LVH with otherwise mild   concentric hypertrophy. Systolic function was mildly reduced. The   estimated ejection fraction was in the range of 45% to 50%.   Hypokinesis of the anteroseptal, inferoseptal, and apical   myocardium. The study is not technically sufficient to allow   evaluation of LV diastolic function. - Aortic valve: There was very mild stenosis. There was mild   regurgitation. Valve area (VTI): 3.42 cm^2. Valve area (Vmax):   3.58 cm^2. Valve area (Vmean): 3.49 cm^2. - Mitral valve: A mechanical prosthesis was present. Transvalvular   velocity was within the normal range. There was no evidence for   stenosis. There was no regurgitation. Valve area by continuity   equation (using LVOT flow): 2.49 cm^2. - Left atrium: The atrium was moderately dilated. - Right ventricle: The cavity size was severely dilated. Wall   thickness was normal. Systolic function was moderately reduced. - Tricuspid valve: There was mild regurgitation. - Pulmonary arteries: Systolic pressure was within the normal   range. PA peak pressure: 22 mm Hg (S).  Impressions:  - Mechanical mitral valve is functioning properly. Gradients   unchanged from 07/2015.   Antimicrobials: None   Subjective: Patient seen and examined at bedside. He feels much better. His shortness of breath and leg swelling is improving. No current chest pain, nausea or vomiting.  Objective: Vitals:   01/18/17 0450 01/18/17 1141 01/18/17 1957 01/19/17 0542  BP: (!) 147/54 (!) 160/62 (!) 141/76 (!) 143/84  Pulse: 63 (!) 59  68 63  Resp: 18 18 20 20   Temp: 97.9 F (36.6 C) 98 F (36.7 C) 98 F (36.7 C) (!) 97.5 F (36.4 C)  TempSrc: Oral Oral Oral Oral  SpO2: 94% 94%  96% 98%  Weight:    (!) 168.8 kg (372 lb 3.2 oz)  Height:        Intake/Output Summary (Last 24 hours) at 01/19/17 1251 Last data filed at 01/19/17 1006  Gross per 24 hour  Intake             1880 ml  Output             3425 ml  Net            -1545 ml   Filed Weights   01/17/17 1655 01/18/17 0143 01/19/17 0542  Weight: (!) 172.8 kg (381 lb) (!) 171.1 kg (377 lb 4.8 oz) (!) 168.8 kg (372 lb 3.2 oz)    Examination:  General exam: Appears calm and comfortable  Respiratory system: Bilateral decreased breath sound at bases With basilar crackles Cardiovascular system: S1 & S2 heard, rate controlled  Gastrointestinal system: Abdomen is morbidly obese, nondistended, soft and nontender. Normal bowel sounds heard. Extremities: No cyanosis, clubbing; 2-3+ pitting pedal edema with chronic venous stasis changes    Data Reviewed: I have personally reviewed following labs and imaging studies  CBC:  Recent Labs Lab 01/17/17 1706 01/18/17 0305  WBC 8.8 8.8  HGB 12.9* 12.4*  HCT 40.0 39.7  MCV 101.0* 101.3*  PLT 217 237   Basic Metabolic Panel:  Recent Labs Lab 01/17/17 1706 01/18/17 0305 01/19/17 0510  NA 134*  --  138  K 4.0  --  3.3*  CL 96*  --  98*  CO2 28  --  30  GLUCOSE 161*  --  93  BUN 19  --  16  CREATININE 1.03  --  0.92  CALCIUM 8.9  --  9.0  MG 2.1 2.2 2.3   GFR: Estimated Creatinine Clearance: 124 mL/min (by C-G formula based on SCr of 0.92 mg/dL). Liver Function Tests:  Recent Labs Lab 01/18/17 0717  AST 30  ALT 15*  ALKPHOS 77  BILITOT 1.4*  PROT 7.3  ALBUMIN 3.8   No results for input(s): LIPASE, AMYLASE in the last 168 hours. No results for input(s): AMMONIA in the last 168 hours. Coagulation Profile:  Recent Labs Lab 01/17/17 1706 01/18/17 0427 01/19/17 0510  INR 2.67 2.48 2.26   Cardiac Enzymes: No results for input(s): CKTOTAL, CKMB, CKMBINDEX, TROPONINI in the last 168 hours. BNP (last 3 results) No results for input(s):  PROBNP in the last 8760 hours. HbA1C: No results for input(s): HGBA1C in the last 72 hours. CBG: No results for input(s): GLUCAP in the last 168 hours. Lipid Profile: No results for input(s): CHOL, HDL, LDLCALC, TRIG, CHOLHDL, LDLDIRECT in the last 72 hours. Thyroid Function Tests:  Recent Labs  01/18/17 0305  TSH 3.296   Anemia Panel: No results for input(s): VITAMINB12, FOLATE, FERRITIN, TIBC, IRON, RETICCTPCT in the last 72 hours. Sepsis Labs: No results for input(s): PROCALCITON, LATICACIDVEN in the last 168 hours.  Recent Results (from the past 240 hour(s))  Culture, Urine     Status: None (Preliminary result)   Collection Time: 01/18/17  4:52 AM  Result Value Ref Range Status   Specimen Description Urine  Final   Special Requests NONE  Final   Culture CULTURE REINCUBATED FOR BETTER GROWTH  Final   Report Status PENDING  Incomplete         Radiology Studies: Dg Chest 2 View  Result Date: 01/17/2017 CLINICAL DATA:  Cough, abdominal swelling, shortness of breath for 1 month EXAM: CHEST  2 VIEW COMPARISON:  05/05/2009 FINDINGS: Left tool lead pacer remains in place, unchanged. There is cardiomegaly with vascular congestion and interstitial prominence throughout the lungs, possibly interstitial edema. No effusions. No acute bony abnormality. IMPRESSION: Cardiomegaly with vascular congestion and increasing interstitial prominence concerning for interstitial edema/CHF. Electronically Signed   By: Rolm Baptise M.D.   On: 01/17/2017 17:59        Scheduled Meds: . aspirin EC  81 mg Oral Daily  . furosemide  80 mg Intravenous Q12H  . hydrochlorothiazide  12.5 mg Oral Daily  . losartan  50 mg Oral Daily  . magnesium oxide  200 mg Oral TID  . multivitamin with minerals  1 tablet Oral Daily  . omega-3 acid ethyl esters  4 g Oral q morning - 10a  . potassium chloride  80 mEq Oral q morning - 10a  . vitamin C  2,000 mg Oral Daily  . warfarin  15 mg Oral ONCE-1800  .  Warfarin - Pharmacist Dosing Inpatient   Does not apply q1800   Continuous Infusions:   LOS: 1 day        Aline August, MD [facility] Pager [phone]  If 7PM-7AM, please contact night-coverage [email or web address] Password [email or web address] 01/19/2017, 12:51 PM

## 2017-01-19 NOTE — Progress Notes (Addendum)
[facility] for Coumadin Indication: mech MVR, AVR, Afib, h/o TIA  Allergies  Allergen Reactions  . Warfarin And Related Other (See Comments)    Per the patient's wife, "Warfain made him have a TIA" CAN TAKE BRAND NAME COUMADIN     Patient Measurements: Height: 6' (182.9 cm) Weight: (!) 372 lb 3.2 oz (168.8 kg) (scale a) IBW/kg (Calculated) : 77.6  Vital Signs: Temp: 97.5 F (36.4 C) (10/12 0542) Temp Source: Oral (10/12 0542) BP: 143/84 (10/12 0542) Pulse Rate: 63 (10/12 0542)  Labs:  Recent Labs  01/17/17 1706 01/18/17 0305 01/18/17 0427 01/19/17 0510  HGB 12.9* 12.4*  --   --   HCT 40.0 39.7  --   --   PLT 217 233  --   --   LABPROT 28.2*  --  26.6* 24.8*  INR 2.67  --  2.48 2.26  CREATININE 1.03  --   --  0.92    Estimated Creatinine Clearance: 124 mL/min (by C-G formula based on SCr of 0.92 mg/dL).   Medical History: Past Medical History:  Diagnosis Date  . Anxiety   . Atrial arrhythmia    status post ablation in [place] with complication including damage to the aortic valve  . Atrial fibrillation ([place])   . AV block, 1st degree   . Bradycardia   . Diastolic CHF, chronic ([place])   . Diverticulosis   . History of melanoma   . HTN (hypertension)   . Hypertrophic cardiomyopathy ([place])    s/p myoectomy  . Internal hemorrhoids   . mitral valve replacement-St. Jude's mechanical   . Morbid obesity ([place])   . Pacemaker  -SJM   . S/P aortic valve repair   . TIA (transient ischemic attack) 2001  . Tubulovillous adenoma     Medications:  Prescriptions Prior to Admission  Medication Sig Dispense Refill Last Dose  . Ascorbic Acid (VITAMIN C) 1000 MG tablet Take 2,000 mg by mouth daily.     01/17/2017 at Unknown time  . aspirin 81 MG tablet Take 81 mg by mouth daily.   01/17/2017 at 0600  . COUMADIN 10 MG tablet TAKE AS DIRECTED BY COUMADIN CLINIC (Patient taking differently: Take 10 mg by mouth in the morning on  Sun/Mon/Tues/Thurs/Fri/Sat and 15 mg on Wed) 100 tablet 1 01/17/2017 at 0600  . fish oil-omega-3 fatty acids 1000 MG capsule Take 4 g by mouth every morning.    01/17/2017 at am  . KLOR-CON 10 10 MEQ tablet Take 8 tablets (80 meq) by mouth once daily (Patient taking differently: Take 80 mEq by mouth every morning. NDC: [phone]) 720 tablet 3 01/17/2017 at am  . losartan-hydrochlorothiazide (HYZAAR) 50-12.5 MG tablet Take 1 tablet by mouth daily. 90 tablet 1 01/17/2017 at am  . Magnesium Oxide 250 MG TABS Take 1 tablet (250 mg total) by mouth 3 (three) times daily. 270 tablet 3 01/17/2017 at pm  . Multiple Vitamin (MULTIVITAMIN) tablet Take 1 tablet by mouth daily.     01/17/2017 at am  . torsemide (DEMADEX) 20 MG tablet Take 2 tablets (40 mg total) by mouth daily. (Patient taking differently: Take 40 mg by mouth every morning. ) 180 tablet 3 01/17/2017 at am   Scheduled:  . aspirin EC  81 mg Oral Daily  . furosemide  80 mg Intravenous Q12H  . hydrochlorothiazide  12.5 mg Oral Daily  . losartan  50 mg Oral Daily  . magnesium oxide  200 mg Oral TID  .  multivitamin with minerals  1 tablet Oral Daily  . omega-3 acid ethyl esters  4 g Oral q morning - 10a  . potassium chloride  80 mEq Oral q morning - 10a  . vitamin C  2,000 mg Oral Daily  . warfarin  10 mg Oral Once per day on Sun Mon Tue Thu Fri Sat  . [START ON 01/24/2017] warfarin  15 mg Oral Q Wed-1800  . Warfarin - Pharmacist Dosing Inpatient   Does not apply q1800    Assessment: 71yo male with history of AVR, afib, hx TIA continuing on Coumadin per Pharmacy during admission; current INR now below goal on home dose at 2.26. Spoke with Dr. Starla Link - no need to bridge at this time. CBC stable. No bleed documented.  PTA warfarin dose: 10mg  daily except 15mg  on Wed (last dose 10/10 PTA)  Goal of Therapy:  INR 2.5-3.5  Monitor platelets by anticoagulation protocol:  yes   Plan:  Warfarin 15mg  PO x 1 (d/c home dose for now) Daily  INR Monitor CBC, s/sx bleeding   Elicia Lamp, PharmD, BCPS Clinical Pharmacist Rx Phone # for today: [phone] After 3:30PM, please call Main Rx: #[id number] 01/19/2017 8:59 AM

## 2017-01-19 NOTE — Care Management Note (Signed)
Case Management Note  Patient Details  Name: Frank [name] MRN: [id number] Date of Birth: 08-27-1945  Subjective/Objective:  CHF                Action/Plan: Patient lives at home with his spouse, independent of his ADL's, continues to drive his vehicle; has private insurance with Medicare / [place] with prescription drug coverage; pharmacy of choice is [facility]; he does not have a PCP and is agreeable to have the NCM assist him in establishing primary care; apt made with Dr Grier Mitts with [place] at [facility] for Oct 23,2018 at 2 pm; No DME needed at this time; CM will continue to follow for DCP.  Expected Discharge Date:     Possibly 01/22/2017             Expected Discharge Plan:  Home/Self Care  In-House Referral:   [facility]  Discharge planning Services  CM Consult, Follow-up appt scheduled   Choice offered to:  Patient  Status of Service:  In process, will continue to follow  Sherrilyn Rist [phone] 01/19/2017, 12:18 PM

## 2017-01-19 NOTE — Progress Notes (Signed)
Progress Note  Patient Name: Frank [name] Date of Encounter: 01/19/2017  Primary Cardiologist: [name],  Subjective   71 year old gentleman who is admitted with acute on chronic diastolic congestive heart failure He was admitted with a 30 pound weight gain.  Inpatient Medications    Scheduled Meds: . aspirin EC  81 mg Oral Daily  . furosemide  80 mg Intravenous Q12H  . hydrochlorothiazide  12.5 mg Oral Daily  . losartan  50 mg Oral Daily  . magnesium oxide  200 mg Oral TID  . multivitamin with minerals  1 tablet Oral Daily  . omega-3 acid ethyl esters  4 g Oral q morning - 10a  . potassium chloride  80 mEq Oral q morning - 10a  . vitamin C  2,000 mg Oral Daily  . warfarin  15 mg Oral ONCE-1800  . Warfarin - Pharmacist Dosing Inpatient   Does not apply q1800   Continuous Infusions:  PRN Meds: acetaminophen **OR** acetaminophen, ondansetron **OR** ondansetron (ZOFRAN) IV   Vital Signs    Vitals:   01/18/17 0450 01/18/17 1141 01/18/17 1957 01/19/17 0542  BP: (!) 147/54 (!) 160/62 (!) 141/76 (!) 143/84  Pulse: 63 (!) 59 68 63  Resp: 18 18 20 20   Temp: 97.9 F (36.6 C) 98 F (36.7 C) 98 F (36.7 C) (!) 97.5 F (36.4 C)  TempSrc: Oral Oral Oral Oral  SpO2: 94% 94% 96% 98%  Weight:    (!) 372 lb 3.2 oz (168.8 kg)  Height:        Intake/Output Summary (Last 24 hours) at 01/19/17 1027 Last data filed at 01/19/17 1006  Gross per 24 hour  Intake             2200 ml  Output             3725 ml  Net            -1525 ml   Filed Weights   01/17/17 1655 01/18/17 0143 01/19/17 0542  Weight: (!) 381 lb (172.8 kg) (!) 377 lb 4.8 oz (171.1 kg) (!) 372 lb 3.2 oz (168.8 kg)    Telemetry    NSR  - Personally Reviewed  ECG     NSR  - Personally Reviewed  Physical Exam   GEN: Morbidly obese middle-aged gentleman in no acute distress.  Neck: No JVD Cardiac: RR , mechanical S1, normal  S2 , soft systolic murmur  Respiratory: rales in bases . GI: morbidly obese ,  less edematous MS: 2+ - 3+  leg edema , chronic stasis changes.   Neuro:  Nonfocal  Psych: Normal affect   Labs    Chemistry Recent Labs Lab 01/17/17 1706 01/18/17 0717 01/19/17 0510  NA 134*  --  138  K 4.0  --  3.3*  CL 96*  --  98*  CO2 28  --  30  GLUCOSE 161*  --  93  BUN 19  --  16  CREATININE 1.03  --  0.92  CALCIUM 8.9  --  9.0  PROT  --  7.3  --   ALBUMIN  --  3.8  --   AST  --  30  --   ALT  --  15*  --   ALKPHOS  --  77  --   BILITOT  --  1.4*  --   GFRNONAA >60  --  >60  GFRAA >60  --  >60  ANIONGAP 10  --  10  Hematology Recent Labs Lab 01/17/17 1706 01/18/17 0305  WBC 8.8 8.8  RBC 3.96* 3.92*  HGB 12.9* 12.4*  HCT 40.0 39.7  MCV 101.0* 101.3*  MCH 32.6 31.6  MCHC 32.3 31.2  RDW 15.1 15.3  PLT 217 233    Cardiac EnzymesNo results for input(s): TROPONINI in the last 168 hours.  Recent Labs Lab 01/17/17 1711  TROPIPOC 0.01     BNP Recent Labs Lab 01/17/17 1706  BNP 75.4     DDimer No results for input(s): DDIMER in the last 168 hours.   Radiology    Dg Chest 2 View  Result Date: 01/17/2017 CLINICAL DATA:  Cough, abdominal swelling, shortness of breath for 1 month EXAM: CHEST  2 VIEW COMPARISON:  05/05/2009 FINDINGS: Left tool lead pacer remains in place, unchanged. There is cardiomegaly with vascular congestion and interstitial prominence throughout the lungs, possibly interstitial edema. No effusions. No acute bony abnormality. IMPRESSION: Cardiomegaly with vascular congestion and increasing interstitial prominence concerning for interstitial edema/CHF. Electronically Signed   By: Rolm Baptise M.D.   On: 01/17/2017 17:59    Cardiac Studies     Patient Profile     71 y.o. male with acute on chronic combined systolic and diastolic congestive heart failure. Status post mitral  valve replacement.  Assessment & Plan    1. Acute on chronic combined systolic and diastolic congestive heart failure: The patient has diuresed 2.2  L so far this admission. He seems to be feeling better. Echo from yesterday reveals mildly reduced LV systolic function with EF of 45-50%.  Diastolic function could not be assessed. He has mild aortic stenosis. Mechanical mitral valve appeared to be functioning normally.  Continue current medications - Lasix 80 mg IV BID  HCTZ 12.5 mg a day Losartan 50 mg a day  2.  S/p MVR:   On couamdin :    INR is 2.26.  Pharmacy is managing   For questions or updates, please contact [place] Please consult [email or web address] for contact info under Cardiology/STEMI.      Signed, Mertie Moores, MD  01/19/2017, 10:27 AM

## 2017-01-20 DIAGNOSIS — I421 Obstructive hypertrophic cardiomyopathy: Secondary | ICD-10-CM

## 2017-01-20 DIAGNOSIS — Z950 Presence of cardiac pacemaker: Secondary | ICD-10-CM

## 2017-01-20 DIAGNOSIS — J810 Acute pulmonary edema: Secondary | ICD-10-CM

## 2017-01-20 DIAGNOSIS — I4891 Unspecified atrial fibrillation: Secondary | ICD-10-CM

## 2017-01-20 LAB — BASIC METABOLIC PANEL
ANION GAP: 9 (ref 5–15)
BUN: 19 mg/dL (ref 6–20)
CALCIUM: 9.2 mg/dL (ref 8.9–10.3)
CO2: 33 mmol/L — ABNORMAL HIGH (ref 22–32)
Chloride: 98 mmol/L — ABNORMAL LOW (ref 101–111)
Creatinine, Ser: 0.93 mg/dL (ref 0.61–1.24)
Glucose, Bld: 97 mg/dL (ref 65–99)
Potassium: 3.3 mmol/L — ABNORMAL LOW (ref 3.5–5.1)
SODIUM: 140 mmol/L (ref 135–145)

## 2017-01-20 LAB — MAGNESIUM: MAGNESIUM: 2.3 mg/dL (ref 1.7–2.4)

## 2017-01-20 LAB — PROTIME-INR
INR: 2.12
PROTHROMBIN TIME: 23.6 s — AB (ref 11.4–15.2)

## 2017-01-20 MED ORDER — POTASSIUM CHLORIDE CRYS ER 20 MEQ PO TBCR
80.0000 meq | EXTENDED_RELEASE_TABLET | Freq: Two times a day (BID) | ORAL | Status: DC
  Filled 2017-01-20 (×2): qty 4

## 2017-01-20 MED ORDER — WARFARIN SODIUM 7.5 MG PO TABS
15.0000 mg | ORAL_TABLET | Freq: Once | ORAL | Status: AC
  Administered 2017-01-20: 15 mg via ORAL
  Filled 2017-01-20: qty 2

## 2017-01-20 MED ORDER — FUROSEMIDE 10 MG/ML IJ SOLN
80.0000 mg | Freq: Three times a day (TID) | INTRAMUSCULAR | Status: DC
  Administered 2017-01-20 – 2017-01-21 (×3): 80 mg via INTRAVENOUS
  Filled 2017-01-20 (×3): qty 8

## 2017-01-20 MED ORDER — SPIRONOLACTONE 25 MG PO TABS
25.0000 mg | ORAL_TABLET | Freq: Every day | ORAL | Status: DC
  Administered 2017-01-20 – 2017-01-21 (×2): 25 mg via ORAL
  Filled 2017-01-20 (×2): qty 1

## 2017-01-20 NOTE — Progress Notes (Signed)
Patient ID: Frank [name], male   DOB: [birth year], 71 y.o.   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 fib on Coumadin presented with complaints of worsening lower extremity edema and abdominal swelling. He was admitted with CHF exacerbation and started on IV Lasix   Assessment & Plan:   Principal Problem:   Acute on chronic diastolic CHF (congestive heart failure) (HCC) Active Problems:   Essential hypertension   Hypertrophic obstructive cardiomyopathy (HCC)   Atrial fibrillation/tachycardia   S/P aortic valve repair   S/P mitral valve replacement-Mechanical   Cardiac pacemaker-St.Jude   CHF (congestive heart failure) (HCC)   Acute pulmonary edema ([place])  1. Acute on chronic diastolic CHF - EF 37-34%. Continue Lasix iv. Cardiology following. Strict input and output and daily weights. Fluid restriction. Follow creatinine. Continue losartan and spironolactone 2. Diffuse rash - likely to be allergic since patient also has itching. Does not look vasculitis. Patient states that his rash is improving. He will follow-up with dermatology as an outpatient. Will avoid oral steroids which might worsen heart failure 3. History of mechanical mitral valve replacement and aortic valve repair status post pacemaker placement - on Coumadin. Pharmacy to dose. INR therapeutic 4. History of myomectomy for hypertrophic obstructive cardiomyopathy. 5. History of tachycardia and A. fib status post ablation: Currently rate controlled 6. Hypertension: Continue Lasix, losartan and hydrochlorothiazide. 7. Hypokalemia: Replace and repeat AM labs 8. Morbid obesity:  outpatient followup    DVT prophylaxis: Coumadin Code Status:  Full Family Communication: None at bedside Disposition Plan: Home in one to 2 days  Consultants: Cardiology  Procedures:  Study Conclusions  - Left ventricle: The cavity size was normal. Septal wall thickness   was increased in a pattern of severe LVH with otherwise mild   concentric hypertrophy. Systolic function was mildly reduced. The   estimated ejection fraction was in the range of 45% to 50%.   Hypokinesis of the anteroseptal, inferoseptal, and apical   myocardium. The study is not technically sufficient to allow   evaluation of LV diastolic function. - Aortic valve: There was very mild stenosis. There was mild   regurgitation. Valve area (VTI): 3.42 cm^2. Valve area (Vmax):   3.58 cm^2. Valve area (Vmean): 3.49 cm^2. - Mitral valve: A mechanical prosthesis was present. Transvalvular   velocity was within the normal range. There was no evidence for   stenosis. There was no regurgitation. Valve area by continuity   equation (using LVOT flow): 2.49 cm^2. - Left atrium: The atrium was moderately dilated. - Right ventricle: The cavity size was severely dilated. Wall   thickness was normal. Systolic function was moderately reduced. - Tricuspid valve: There was mild regurgitation. - Pulmonary arteries: Systolic pressure was within the normal   range. PA peak pressure: 22 mm Hg (S).  Impressions:  - Mechanical mitral valve is functioning properly. Gradients   unchanged from 07/2015.   Antimicrobials: None   Subjective: Patient seen and examined at bedside.  His shortness of breath and leg swelling is improving. No current chest pain, nausea or vomiting.  Objective: Vitals:   01/19/17 0542 01/19/17 1254 01/19/17 2040 01/20/17 0520  BP: (!) 143/84 (!) 121/50 (!) 127/54 (!) 143/63  Pulse: 63 67 68 64  Resp: 20 18 18 18   Temp: (!) 97.5 F (36.4 C) 98.4 F (36.9 C) 98.9 F (37.2 C) 97.6 F (36.4 C)    TempSrc: Oral Oral Oral Oral  SpO2: 98% 100% 93% 90%  Weight: (!) 168.8 kg (372 lb 3.2 oz)   (!) 167.9 kg (370 lb 3.2 oz)  Height:        Intake/Output Summary (Last 24 hours) at 01/20/17 1128 Last data filed at 01/20/17 5035  Gross per 24 hour  Intake              900 ml  Output             3850 ml  Net            -2950 ml   Filed Weights   01/18/17 0143 01/19/17 0542 01/20/17 0520  Weight: (!) 171.1 kg (377 lb 4.8 oz) (!) 168.8 kg (372 lb 3.2 oz) (!) 167.9 kg (370 lb 3.2 oz)    Examination:  General exam: Appears calm and comfortable  Respiratory system: Bilateral decreased breath sound at bases With basilar crackles Cardiovascular system: S1 & S2 heard, rate controlled  Gastrointestinal system: Abdomen is morbidly obese, nondistended, soft and nontender. Normal bowel sounds heard. Extremities: No cyanosis, clubbing; 2-3+ pitting pedal edema with chronic venous stasis changes    Data Reviewed: I have personally reviewed following labs and imaging studies  CBC:  Recent Labs Lab 01/17/17 1706 01/18/17 0305  WBC 8.8 8.8  HGB 12.9* 12.4*  HCT 40.0 39.7  MCV 101.0* 101.3*  PLT 217 465   Basic Metabolic Panel:  Recent Labs Lab 01/17/17 1706 01/18/17 0305 01/19/17 0510 01/20/17 0530  NA 134*  --  138 140  K 4.0  --  3.3* 3.3*  CL 96*  --  98* 98*  CO2 28  --  30 33*  GLUCOSE 161*  --  93 97  BUN 19  --  16 19  CREATININE 1.03  --  0.92 0.93  CALCIUM 8.9  --  9.0 9.2  MG 2.1 2.2 2.3 2.3   GFR: Estimated Creatinine Clearance: 122.3 mL/min (by C-G formula based on SCr of 0.93 mg/dL). Liver Function Tests:  Recent Labs Lab 01/18/17 0717  AST 30  ALT 15*  ALKPHOS 77  BILITOT 1.4*  PROT 7.3  ALBUMIN 3.8   No results for input(s): LIPASE, AMYLASE in the last 168 hours. No results for input(s): AMMONIA in the last 168 hours. Coagulation Profile:  Recent Labs Lab 01/17/17 1706 01/18/17 0427 01/19/17 0510 01/20/17 0530  INR 2.67 2.48 2.26 2.12    Cardiac Enzymes: No results for input(s): CKTOTAL, CKMB, CKMBINDEX, TROPONINI in the last 168 hours. BNP (last 3 results) No results for input(s): PROBNP in the last 8760 hours. HbA1C: No results for input(s): HGBA1C in the last 72 hours. CBG: No results for input(s): GLUCAP in the last 168 hours. Lipid Profile: No results for input(s): CHOL, HDL, LDLCALC, TRIG, CHOLHDL, LDLDIRECT in the last 72 hours. Thyroid Function Tests:  Recent Labs  01/18/17 0305  TSH 3.296   Anemia Panel: No results for input(s): VITAMINB12, FOLATE, FERRITIN, TIBC, IRON, RETICCTPCT in the last 72 hours. Sepsis Labs: No results for input(s): PROCALCITON, LATICACIDVEN in the last 168 hours.  Recent Results (from the past 240 hour(s))  Culture, Urine     Status: None (Preliminary result)   Collection Time: 01/18/17  4:52 AM  Result Value Ref Range Status  Specimen Description Urine  Final   Special Requests NONE  Final   Culture CULTURE REINCUBATED FOR BETTER GROWTH  Final   Report Status PENDING  Incomplete         Radiology Studies: No results found.      Scheduled Meds: . aspirin EC  81 mg Oral Daily  . furosemide  80 mg Intravenous Q8H  . losartan  50 mg Oral Daily  . magnesium oxide  200 mg Oral TID  . multivitamin with minerals  1 tablet Oral Daily  . omega-3 acid ethyl esters  4 g Oral q morning - 10a  . potassium chloride  80 mEq Oral BID  . spironolactone  25 mg Oral Daily  . vitamin C  2,000 mg Oral Daily  . Warfarin - Pharmacist Dosing Inpatient   Does not apply q1800   Continuous Infusions:   LOS: 2 days        Aline August, MD [facility] Pager [phone]  If 7PM-7AM, please contact night-coverage [email or web address] Password [email or web address] 01/20/2017, 11:28 AM

## 2017-01-20 NOTE — Progress Notes (Signed)
Progress Note  Patient Name: Frank [name] Date of Encounter: 01/20/2017  Primary Cardiologist: [name],  Subjective   71 year old gentleman who is admitted with acute on chronic diastolic congestive heart failure He was admitted with a 30 pound weight gain. Now feeling almost back to normal, still some LE edema.  Inpatient Medications    Scheduled Meds: . aspirin EC  81 mg Oral Daily  . furosemide  80 mg Intravenous Q12H  . hydrochlorothiazide  12.5 mg Oral Daily  . losartan  50 mg Oral Daily  . magnesium oxide  200 mg Oral TID  . multivitamin with minerals  1 tablet Oral Daily  . omega-3 acid ethyl esters  4 g Oral q morning - 10a  . potassium chloride  80 mEq Oral q morning - 10a  . vitamin C  2,000 mg Oral Daily  . Warfarin - Pharmacist Dosing Inpatient   Does not apply q1800   Continuous Infusions:  PRN Meds: acetaminophen **OR** acetaminophen, ondansetron **OR** ondansetron (ZOFRAN) IV   Vital Signs    Vitals:   01/19/17 0542 01/19/17 1254 01/19/17 2040 01/20/17 0520  BP: (!) 143/84 (!) 121/50 (!) 127/54 (!) 143/63  Pulse: 63 67 68 64  Resp: 20 18 18 18   Temp: (!) 97.5 F (36.4 C) 98.4 F (36.9 C) 98.9 F (37.2 C) 97.6 F (36.4 C)  TempSrc: Oral Oral Oral Oral  SpO2: 98% 100% 93% 90%  Weight: (!) 372 lb 3.2 oz (168.8 kg)   (!) 370 lb 3.2 oz (167.9 kg)  Height:        Intake/Output Summary (Last 24 hours) at 01/20/17 1006 Last data filed at 01/20/17 7564  Gross per 24 hour  Intake              900 ml  Output             3850 ml  Net            -2950 ml   Filed Weights   01/18/17 0143 01/19/17 0542 01/20/17 0520  Weight: (!) 377 lb 4.8 oz (171.1 kg) (!) 372 lb 3.2 oz (168.8 kg) (!) 370 lb 3.2 oz (167.9 kg)    Telemetry    NSR  - Personally Reviewed  ECG     NSR  - Personally Reviewed  Physical Exam   GEN: Morbidly obese middle-aged gentleman in no acute distress.  Neck: No JVD Cardiac: RR , mechanical S1, normal  S2 , soft systolic  murmur  Respiratory: rales in bases . GI: morbidly obese , less edematous MS: 2+ - 3+  leg edema , chronic stasis changes.   Neuro:  Nonfocal  Psych: Normal affect   Labs    Chemistry  Recent Labs Lab 01/17/17 1706 01/18/17 0717 01/19/17 0510 01/20/17 0530  NA 134*  --  138 140  K 4.0  --  3.3* 3.3*  CL 96*  --  98* 98*  CO2 28  --  30 33*  GLUCOSE 161*  --  93 97  BUN 19  --  16 19  CREATININE 1.03  --  0.92 0.93  CALCIUM 8.9  --  9.0 9.2  PROT  --  7.3  --   --   ALBUMIN  --  3.8  --   --   AST  --  30  --   --   ALT  --  15*  --   --   ALKPHOS  --  77  --   --  BILITOT  --  1.4*  --   --   GFRNONAA >60  --  >60 >60  GFRAA >60  --  >60 >60  ANIONGAP 10  --  10 9     Hematology  Recent Labs Lab 01/17/17 1706 01/18/17 0305  WBC 8.8 8.8  RBC 3.96* 3.92*  HGB 12.9* 12.4*  HCT 40.0 39.7  MCV 101.0* 101.3*  MCH 32.6 31.6  MCHC 32.3 31.2  RDW 15.1 15.3  PLT 217 233    Cardiac EnzymesNo results for input(s): TROPONINI in the last 168 hours.   Recent Labs Lab 01/17/17 1711  TROPIPOC 0.01     BNP  Recent Labs Lab 01/17/17 1706  BNP 75.4     DDimer No results for input(s): DDIMER in the last 168 hours.   Radiology    No results found.  Cardiac Studies     Patient Profile     71 y.o. male with acute on chronic combined systolic and diastolic congestive heart failure. Status post mitral  valve replacement.  Assessment & Plan    1. Acute on chronic combined systolic and diastolic congestive heart failure: The patient has diuresed 5 L so far this admission. He seems to be feeling better. However at 3370 lbs, baseline 350 lbs. Echo from yesterday reveals mildly reduced LV systolic function with EF of 45-50%.  Diastolic function could not be assessed. He has mild aortic stenosis. Mechanical mitral valve appeared to be functioning normally. Crea stable 0.9, K low 3.3, I will replace He wants to go home, we will plan for tomorrow, his legs  are severely swollen, however he says that's his baseline.  Continue current medications Increase Lasix to 80 mg IV TID  D/C HCTZ 12.5 mg a day Start spironolactone 25 mg po daily Losartan 50 mg a day Increase KCl to 80 mEq BID  Increase home dose of Torsemide to 40 mg BID.   2.  S/p MVR:   On coumadine :    INR is 2.26.  Pharmacy is managing   For questions or updates, please contact [place] Please consult [email or web address] for contact info under Cardiology/STEMI.      Signed, Ena Dawley, MD  01/20/2017, 10:06 AM

## 2017-01-20 NOTE — Progress Notes (Signed)
[place] for Coumadin Indication: mech MVR, AVR, Afib, h/o TIA  Allergies  Allergen Reactions  . Warfarin And Related Other (See Comments)    Per the patient's wife, "Warfain made him have a TIA" CAN TAKE BRAND NAME COUMADIN     Patient Measurements: Height: 6' (182.9 cm) Weight: (!) 370 lb 3.2 oz (167.9 kg) IBW/kg (Calculated) : 77.6  Vital Signs: Temp: 97.6 F (36.4 C) (10/13 0520) Temp Source: Oral (10/13 0520) BP: 143/63 (10/13 0520) Pulse Rate: 64 (10/13 0520)  Labs:  Recent Labs  01/17/17 1706 01/18/17 0305 01/18/17 0427 01/19/17 0510 01/20/17 0530  HGB 12.9* 12.4*  --   --   --   HCT 40.0 39.7  --   --   --   PLT 217 233  --   --   --   LABPROT 28.2*  --  26.6* 24.8* 23.6*  INR 2.67  --  2.48 2.26 2.12  CREATININE 1.03  --   --  0.92 0.93    Estimated Creatinine Clearance: 122.3 mL/min (by C-G formula based on SCr of 0.93 mg/dL).   Medical History: Past Medical History:  Diagnosis Date  . Anxiety   . Atrial arrhythmia    status post ablation in [place] with complication including damage to the aortic valve  . Atrial fibrillation ([place])   . AV block, 1st degree   . Bradycardia   . Diastolic CHF, chronic ([place])   . Diverticulosis   . History of melanoma   . HTN (hypertension)   . Hypertrophic cardiomyopathy ([place])    s/p myoectomy  . Internal hemorrhoids   . mitral valve replacement-St. Jude's mechanical   . Morbid obesity ([place])   . Pacemaker  -SJM   . S/P aortic valve repair   . TIA (transient ischemic attack) 2001  . Tubulovillous adenoma     Medications:  Prescriptions Prior to Admission  Medication Sig Dispense Refill Last Dose  . Ascorbic Acid (VITAMIN C) 1000 MG tablet Take 2,000 mg by mouth daily.     01/17/2017 at Unknown time  . aspirin 81 MG tablet Take 81 mg by mouth daily.   01/17/2017 at 0600  . COUMADIN 10 MG tablet TAKE AS DIRECTED BY COUMADIN CLINIC (Patient taking differently: Take 10 mg  by mouth in the morning on Sun/Mon/Tues/Thurs/Fri/Sat and 15 mg on Wed) 100 tablet 1 01/17/2017 at 0600  . fish oil-omega-3 fatty acids 1000 MG capsule Take 4 g by mouth every morning.    01/17/2017 at am  . KLOR-CON 10 10 MEQ tablet Take 8 tablets (80 meq) by mouth once daily (Patient taking differently: Take 80 mEq by mouth every morning. NDC: [phone]) 720 tablet 3 01/17/2017 at am  . losartan-hydrochlorothiazide (HYZAAR) 50-12.5 MG tablet Take 1 tablet by mouth daily. 90 tablet 1 01/17/2017 at am  . Magnesium Oxide 250 MG TABS Take 1 tablet (250 mg total) by mouth 3 (three) times daily. 270 tablet 3 01/17/2017 at pm  . Multiple Vitamin (MULTIVITAMIN) tablet Take 1 tablet by mouth daily.     01/17/2017 at am  . torsemide (DEMADEX) 20 MG tablet Take 2 tablets (40 mg total) by mouth daily. (Patient taking differently: Take 40 mg by mouth every morning. ) 180 tablet 3 01/17/2017 at am   Scheduled:  . aspirin EC  81 mg Oral Daily  . furosemide  80 mg Intravenous Q8H  . losartan  50 mg Oral Daily  . magnesium oxide  200 mg  Oral TID  . multivitamin with minerals  1 tablet Oral Daily  . omega-3 acid ethyl esters  4 g Oral q morning - 10a  . potassium chloride  80 mEq Oral BID  . spironolactone  25 mg Oral Daily  . vitamin C  2,000 mg Oral Daily  . Warfarin - Pharmacist Dosing Inpatient   Does not apply q1800    Assessment: 71yo male with history of AVR, afib, hx TIA continuing on Coumadin per Pharmacy during admission; current INR below goal at 2.12, decreased after higher dose last night. Spoke with Dr. Starla Link 10/12 - no need to bridge at this time. CBC stable. No bleed documented.  PTA warfarin dose: 10mg  daily except 15mg  on Wed (last dose 10/10 PTA)  Goal of Therapy:  INR 2.5-3.5  Monitor platelets by anticoagulation protocol:  yes   Plan:  Warfarin 15mg  PO x 1 again tonight Daily INR Monitor CBC, s/sx bleeding   Elicia Lamp, PharmD, BCPS Clinical Pharmacist Rx Phone # for  today: [phone] After 3:30PM, please call Main Rx: #[id number] 01/20/2017 11:48 AM

## 2017-01-21 DIAGNOSIS — Z9889 Other specified postprocedural states: Secondary | ICD-10-CM

## 2017-01-21 DIAGNOSIS — I10 Essential (primary) hypertension: Secondary | ICD-10-CM

## 2017-01-21 LAB — PROTIME-INR
INR: 2.24
PROTHROMBIN TIME: 24.6 s — AB (ref 11.4–15.2)

## 2017-01-21 LAB — BASIC METABOLIC PANEL
Anion gap: 8 (ref 5–15)
BUN: 19 mg/dL (ref 6–20)
CHLORIDE: 99 mmol/L — AB (ref 101–111)
CO2: 33 mmol/L — ABNORMAL HIGH (ref 22–32)
CREATININE: 0.92 mg/dL (ref 0.61–1.24)
Calcium: 9 mg/dL (ref 8.9–10.3)
GFR calc non Af Amer: >60 mL/min (ref >60)
Glucose, Bld: 96 mg/dL (ref 65–99)
POTASSIUM: 3.6 mmol/L (ref 3.5–5.1)
SODIUM: 140 mmol/L (ref 135–145)

## 2017-01-21 LAB — MAGNESIUM: Magnesium: 2.3 mg/dL (ref 1.7–2.4)

## 2017-01-21 MED ORDER — POTASSIUM CHLORIDE CRYS ER 20 MEQ PO TBCR: 20.0000 meq | EXTENDED_RELEASE_TABLET | Freq: Two times a day (BID) | ORAL | Status: DC

## 2017-01-21 MED ORDER — WARFARIN SODIUM 7.5 MG PO TABS: 15.0000 mg | ORAL_TABLET | Freq: Once | ORAL | Status: DC

## 2017-01-21 MED ORDER — LOSARTAN POTASSIUM 50 MG PO TABS
100.0000 mg | ORAL_TABLET | Freq: Every day | ORAL | Status: DC
  Administered 2017-01-21: 100 mg via ORAL

## 2017-01-21 MED ORDER — TORSEMIDE 20 MG PO TABS
40.0000 mg | ORAL_TABLET | Freq: Two times a day (BID) | ORAL | Status: DC
  Administered 2017-01-21: 40 mg via ORAL
  Filled 2017-01-21: qty 2

## 2017-01-21 MED ORDER — SPIRONOLACTONE 25 MG PO TABS: 25.0000 mg | ORAL_TABLET | Freq: Every day | ORAL | 0 refills | Status: DC

## 2017-01-21 MED ORDER — POTASSIUM CHLORIDE CRYS ER 10 MEQ PO TBCR
40.0000 meq | EXTENDED_RELEASE_TABLET | Freq: Three times a day (TID) | ORAL | Status: DC
  Administered 2017-01-21 (×2): 40 meq via ORAL
  Filled 2017-01-21 (×3): qty 4

## 2017-01-21 MED ORDER — TORSEMIDE 20 MG PO TABS: 40.0000 mg | ORAL_TABLET | Freq: Two times a day (BID) | ORAL | Status: DC

## 2017-01-21 MED ORDER — POTASSIUM CHLORIDE CRYS ER 20 MEQ PO TBCR: 40.0000 meq | EXTENDED_RELEASE_TABLET | Freq: Two times a day (BID) | ORAL | Status: DC

## 2017-01-21 MED ORDER — LOSARTAN POTASSIUM 100 MG PO TABS: 100.0000 mg | ORAL_TABLET | Freq: Every day | ORAL | 0 refills | Status: DC

## 2017-01-21 NOTE — Discharge Summary (Signed)
Physician Discharge Summary  DREYSON MISHKIN FOY:[id number] DOB: March 15, 1946 DOA: 01/17/2017  PCP: Patient, No Pcp Per  Admit date: 01/17/2017 Discharge date: 01/21/2017  Admitted From: home Disposition:  home  Recommendations for Outpatient Follow-up:  1. Follow up with PCP in 1 week with repeat INR and BMP with magnesium 2. Follow-up with cardiology/Dr. 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 fib on Coumadin presented with complaints of worsening lower extremity edema and abdominal swelling. He was admitted with CHF exacerbation and started on IV Lasix. he diuresed well. Cardiology evaluated the patient. He has been switched to oral torsemide and cardiology has cleared the patient for discharge.  Discharge Diagnoses:  Principal Problem:   Acute on chronic diastolic CHF (congestive heart failure) (HCC) Active Problems:   Essential hypertension   Hypertrophic obstructive cardiomyopathy (HCC)   Atrial fibrillation/tachycardia   S/P aortic valve repair   S/P mitral valve replacement-Mechanical   Cardiac pacemaker-St.Jude   CHF (congestive heart failure) (HCC)   Acute pulmonary edema ([place])  1. Acute on chronic diastolic CHF - EF 76-72%. Currently on IV Lasix. Negative balance of 7226 mL since admission. Cardiology follow-up appreciated. Patient needs to comply with strict input and output and low salt diet. Continue losartan and spironolactone. Lasix has been switched to oral torsemide 40 mg by mouth twice a day by cardiology which will be continued. Patient will be  discharged today with outpatient follow-up with primary care provider and cardiology with repeat BMP and magnesium. 2. Diffuse rash - likely to be allergic since patient also has itching. Does not look vasculitis. Patient states that his rash is improving. He will follow-up with dermatology as an outpatient. Will avoid oral steroids which might worsen heart failure 3. History of mechanical mitral valve replacement and aortic valve repair status post pacemaker placement - on Coumadin. Outpatient follow-up of INR 4. History of myomectomy for hypertrophic obstructive cardiomyopathy. 5. History of tachycardia and A. fib status post ablation: Currently rate controlled. Outpatient follow-up 6. Hypertension: Continue Lasix, losartan and spironolactone. Hydrochlorothiazide discontinued by cardiology 7. Hypokalemia: continue potassium replacement and outpatient follow-up 8. Morbid obesity: outpatient followup  Discharge Instructions  Discharge Instructions    (HEART FAILURE PATIENTS) Call MD:  Anytime you have any of the following symptoms: 1) 3 pound weight gain in 24 hours or 5 pounds in 1 week 2) shortness of breath, with or without a dry hacking cough 3) swelling in the hands, feet or stomach 4) if you have to sleep on extra pillows at night in order to breathe.    Complete by:  As directed    Call MD for:  difficulty breathing, headache or visual disturbances    Complete by:  As directed    Call MD for:  extreme fatigue    Complete by:  As directed    Call MD for:  hives    Complete by:  As directed    Call MD for:  persistant dizziness or light-headedness    Complete by:  As directed    Call MD for:  persistant nausea and vomiting    Complete by:  As directed    Call MD  for:  severe uncontrolled pain    Complete by:  As directed    Call MD for:  temperature >100.4    Complete by:  As directed    Diet - low sodium heart healthy    Complete by:  As directed    Increase activity slowly     Complete by:  As directed      Allergies as of 01/21/2017      Reactions   Warfarin And Related Other (See Comments)   Per the patient's wife, "Warfain made him have a TIA" CAN TAKE BRAND NAME COUMADIN       Medication List    STOP taking these medications   losartan-hydrochlorothiazide 50-12.5 MG tablet Commonly known as:  HYZAAR     TAKE these medications   aspirin 81 MG tablet Take 81 mg by mouth daily.   COUMADIN 10 MG tablet Generic drug:  warfarin TAKE AS DIRECTED BY COUMADIN CLINIC What changed:  See the new instructions.   fish oil-omega-3 fatty acids 1000 MG capsule Take 4 g by mouth every morning.   KLOR-CON 10 10 MEQ tablet Generic drug:  potassium chloride Take 8 tablets (80 meq) by mouth once daily What changed:  how much to take  how to take this  when to take this  additional instructions   losartan 100 MG tablet Commonly known as:  COZAAR Take 1 tablet (100 mg total) by mouth daily.   Magnesium Oxide 250 MG Tabs Take 1 tablet (250 mg total) by mouth 3 (three) times daily.   multivitamin tablet Take 1 tablet by mouth daily.   spironolactone 25 MG tablet Commonly known as:  ALDACTONE Take 1 tablet (25 mg total) by mouth daily.   torsemide 20 MG tablet Commonly known as:  DEMADEX Take 2 tablets (40 mg total) by mouth 2 (two) times daily. What changed:  when to take this   vitamin C 1000 MG tablet Take 2,000 mg by mouth daily.      Follow-up Information    [name], MD Follow up on 01/30/2017.   Specialty:  Family Medicine Why:  at 2 pm; please try to keep your apt or call to reschedule Contact information: [place] [address] [phone]        [name], MD Follow up.   Specialty:  Cardiology Why:  The office will contact you to arrange for Cardiology follow-up. If you do not hear from them within 3 business days, please contact the number provided.  Repeat BMP with  Magnesium Contact information: [address] [place] [address] [phone]          Allergies  Allergen Reactions  . Warfarin And Related Other (See Comments)    Per the patient's wife, "Warfain made him have a TIA" CAN TAKE BRAND NAME COUMADIN     Consultations: cardiology  Procedures/Studies: Dg Chest 2 View  Result Date: 01/17/2017 CLINICAL DATA:  Cough, abdominal swelling, shortness of breath for 1 month EXAM: CHEST  2 VIEW COMPARISON:  05/05/2009 FINDINGS: Left tool lead pacer remains in place, unchanged. There is cardiomegaly with vascular congestion and interstitial prominence throughout the lungs, possibly interstitial edema. No effusions. No acute bony abnormality. IMPRESSION: Cardiomegaly with vascular congestion and increasing interstitial prominence concerning for interstitial edema/CHF. Electronically Signed   By: Rolm Baptise M.D.   On: 01/17/2017 17:59    Study Conclusions  - Left ventricle: The cavity size was normal. Septal wall  thickness was increased in a pattern of severe LVH with otherwise mild concentric hypertrophy. Systolic function was mildly reduced. The estimated ejection fraction was in the range of 45% to 50%. Hypokinesis of the anteroseptal, inferoseptal, and apical myocardium. The study is not technically sufficient to allow evaluation of LV diastolic function. - Aortic valve: There was very mild stenosis. There was mild regurgitation. Valve area (VTI): 3.42 cm^2. Valve area (Vmax): 3.58 cm^2. Valve area (Vmean): 3.49 cm^2. - Mitral valve: A mechanical prosthesis was present. Transvalvular velocity was within the normal range. There was no evidence for stenosis. There was no regurgitation. Valve area by continuity equation (using LVOT flow): 2.49 cm^2. - Left atrium: The atrium was moderately dilated. - Right ventricle: The cavity size was severely dilated. Wall thickness was normal. Systolic  function was moderately reduced. - Tricuspid valve: There was mild regurgitation. - Pulmonary arteries: Systolic pressure was within the normal range. PA peak pressure: 22 mm Hg (S).  Impressions:  - Mechanical mitral valve is functioning properly. Gradients unchanged from 07/2015.    Subjective: Patient seen and examined at bedside. He feels much better and wants to go home. No overnight fever, nausea vomiting or chest pain.  Discharge Exam: Vitals:   01/20/17 1955 01/21/17 0510  BP: (!) 144/70 (!) 159/57  Pulse: 67 63  Resp: 18 18  Temp: 98.7 F (37.1 C) (!) 97.5 F (36.4 C)  SpO2: 93% 93%   Vitals:   01/20/17 0520 01/20/17 1200 01/20/17 1955 01/21/17 0510  BP: (!) 143/63 (!) 145/66 (!) 144/70 (!) 159/57  Pulse: 64 64 67 63  Resp: 18 18 18 18   Temp: 97.6 F (36.4 C) 97.9 F (36.6 C) 98.7 F (37.1 C) (!) 97.5 F (36.4 C)  TempSrc: Oral Oral Oral Oral  SpO2: 90% 96% 93% 93%  Weight: (!) 167.9 kg (370 lb 3.2 oz)   (!) 164.9 kg (363 lb 8 oz)  Height:        General: Pt is alert, awake, not in acute distress Cardiovascular: rate controlled, S1/S2 + Respiratory: bilateral decreased breath sounds at bases with some scattered crackles Abdominal: Soft, NT, ND, bowel sounds + Extremities: No cyanosis, clubbing; 2-3+ pitting pedal edema with chronic venous stasis changes    The results of significant diagnostics from this hospitalization (including imaging, microbiology, ancillary and laboratory) are listed below for reference.     Microbiology: Recent Results (from the past 240 hour(s))  Culture, Urine     Status: None (Preliminary result)   Collection Time: 01/18/17  4:52 AM  Result Value Ref Range Status   Specimen Description Urine  Final   Special Requests NONE  Final   Culture CULTURE REINCUBATED FOR BETTER GROWTH  Final   Report Status PENDING  Incomplete     Labs: BNP (last 3 results)  Recent Labs  01/17/17 1706  BNP 16.1   Basic Metabolic  Panel:  Recent Labs Lab 01/17/17 1706 01/18/17 0305 01/19/17 0510 01/20/17 0530 01/21/17 0415  NA 134*  --  138 140 140  K 4.0  --  3.3* 3.3* 3.6  CL 96*  --  98* 98* 99*  CO2 28  --  30 33* 33*  GLUCOSE 161*  --  93 97 96  BUN 19  --  16 19 19   CREATININE 1.03  --  0.92 0.93 0.92  CALCIUM 8.9  --  9.0 9.2 9.0  MG 2.1 2.2 2.3 2.3 2.3   Liver Function Tests:  Recent Labs Lab 01/18/17  0717  AST 30  ALT 15*  ALKPHOS 77  BILITOT 1.4*  PROT 7.3  ALBUMIN 3.8   No results for input(s): LIPASE, AMYLASE in the last 168 hours. No results for input(s): AMMONIA in the last 168 hours. CBC:  Recent Labs Lab 01/17/17 1706 01/18/17 0305  WBC 8.8 8.8  HGB 12.9* 12.4*  HCT 40.0 39.7  MCV 101.0* 101.3*  PLT 217 233   Cardiac Enzymes: No results for input(s): CKTOTAL, CKMB, CKMBINDEX, TROPONINI in the last 168 hours. BNP: Invalid input(s): POCBNP CBG: No results for input(s): GLUCAP in the last 168 hours. D-Dimer No results for input(s): DDIMER in the last 72 hours. Hgb A1c No results for input(s): HGBA1C in the last 72 hours. Lipid Profile No results for input(s): CHOL, HDL, LDLCALC, TRIG, CHOLHDL, LDLDIRECT in the last 72 hours. Thyroid function studies No results for input(s): TSH, T4TOTAL, T3FREE, THYROIDAB in the last 72 hours.  Invalid input(s): FREET3 Anemia work up No results for input(s): VITAMINB12, FOLATE, FERRITIN, TIBC, IRON, RETICCTPCT in the last 72 hours. Urinalysis    Component Value Date/Time   COLORURINE YELLOW 01/18/2017 0018   APPEARANCEUR CLEAR 01/18/2017 0018   LABSPEC 1.010 01/18/2017 0018   PHURINE 6.0 01/18/2017 0018   GLUCOSEU NEGATIVE 01/18/2017 0018   HGBUR NEGATIVE 01/18/2017 0018   BILIRUBINUR NEGATIVE 01/18/2017 0018   KETONESUR NEGATIVE 01/18/2017 0018   PROTEINUR NEGATIVE 01/18/2017 0018   NITRITE NEGATIVE 01/18/2017 0018   LEUKOCYTESUR MODERATE (A) 01/18/2017 0018   Sepsis Labs Invalid input(s): PROCALCITONIN,  WBC,   LACTICIDVEN Microbiology Recent Results (from the past 240 hour(s))  Culture, Urine     Status: None (Preliminary result)   Collection Time: 01/18/17  4:52 AM  Result Value Ref Range Status   Specimen Description Urine  Final   Special Requests NONE  Final   Culture CULTURE REINCUBATED FOR BETTER GROWTH  Final   Report Status PENDING  Incomplete     Time coordinating discharge: 35 minutes  SIGNED:   Aline August, MD  [facility] 01/21/2017, 11:27 AM Pager: [phone]  If 7PM-7AM, please contact night-coverage [email or web address] Password [email or web address]

## 2017-01-21 NOTE — Progress Notes (Signed)
Progress Note  Patient Name: Frank [name] Date of Encounter: 01/21/2017  Primary Cardiologist: [name],  Subjective   71 year old gentleman who is admitted with acute on chronic diastolic congestive heart failure He was admitted with a 30 pound weight gain. Now feeling almost back to normal, still some LE edema, but states that's his baseline.  Inpatient Medications    Scheduled Meds: . aspirin EC  81 mg Oral Daily  . furosemide  80 mg Intravenous Q8H  . losartan  50 mg Oral Daily  . magnesium oxide  200 mg Oral TID  . multivitamin with minerals  1 tablet Oral Daily  . omega-3 acid ethyl esters  4 g Oral q morning - 10a  . potassium chloride  40 mEq Oral TID AC & HS  . spironolactone  25 mg Oral Daily  . vitamin C  2,000 mg Oral Daily  . Warfarin - Pharmacist Dosing Inpatient   Does not apply q1800   Continuous Infusions:  PRN Meds: acetaminophen **OR** acetaminophen, ondansetron **OR** ondansetron (ZOFRAN) IV   Vital Signs    Vitals:   01/20/17 0520 01/20/17 1200 01/20/17 1955 01/21/17 0510  BP: (!) 143/63 (!) 145/66 (!) 144/70 (!) 159/57  Pulse: 64 64 67 63  Resp: 18 18 18 18   Temp: 97.6 F (36.4 C) 97.9 F (36.6 C) 98.7 F (37.1 C) (!) 97.5 F (36.4 C)  TempSrc: Oral Oral Oral Oral  SpO2: 90% 96% 93% 93%  Weight: (!) 370 lb 3.2 oz (167.9 kg)   (!) 363 lb 8 oz (164.9 kg)  Height:        Intake/Output Summary (Last 24 hours) at 01/21/17 5361 Last data filed at 01/21/17 0700  Gross per 24 hour  Intake              720 ml  Output             3650 ml  Net            -2930 ml   Filed Weights   01/19/17 0542 01/20/17 0520 01/21/17 0510  Weight: (!) 372 lb 3.2 oz (168.8 kg) (!) 370 lb 3.2 oz (167.9 kg) (!) 363 lb 8 oz (164.9 kg)    Telemetry    NSR  - Personally Reviewed  ECG     NSR  - Personally Reviewed  Physical Exam   GEN: Morbidly obese middle-aged gentleman in no acute distress.  Neck: No JVD Cardiac: RR , mechanical S1, normal  S2 ,  soft systolic murmur  Respiratory: rales in bases . GI: morbidly obese , less edematous MS: 2+ - 3+  leg edema , chronic stasis changes.   Neuro:  Nonfocal  Psych: Normal affect   Labs    Chemistry  Recent Labs Lab 01/18/17 0717 01/19/17 0510 01/20/17 0530 01/21/17 0415  NA  --  138 140 140  K  --  3.3* 3.3* 3.6  CL  --  98* 98* 99*  CO2  --  30 33* 33*  GLUCOSE  --  93 97 96  BUN  --  16 19 19   CREATININE  --  0.92 0.93 0.92  CALCIUM  --  9.0 9.2 9.0  PROT 7.3  --   --   --   ALBUMIN 3.8  --   --   --   AST 30  --   --   --   ALT 15*  --   --   --   ALKPHOS 77  --   --   --  BILITOT 1.4*  --   --   --   GFRNONAA  --  >60 >60 >60  GFRAA  --  >60 >60 >60  ANIONGAP  --  10 9 8      Hematology  Recent Labs Lab 01/17/17 1706 01/18/17 0305  WBC 8.8 8.8  RBC 3.96* 3.92*  HGB 12.9* 12.4*  HCT 40.0 39.7  MCV 101.0* 101.3*  MCH 32.6 31.6  MCHC 32.3 31.2  RDW 15.1 15.3  PLT 217 233    Cardiac EnzymesNo results for input(s): TROPONINI in the last 168 hours.   Recent Labs Lab 01/17/17 1711  TROPIPOC 0.01     BNP  Recent Labs Lab 01/17/17 1706  BNP 75.4     DDimer No results for input(s): DDIMER in the last 168 hours.   Radiology    No results found.  Cardiac Studies     Patient Profile     71 y.o. male with acute on chronic combined systolic and diastolic congestive heart failure. Status post mitral  valve replacement.  Assessment & Plan    1. Acute on chronic combined systolic and diastolic congestive heart failure: The patient has diuresed 5 L so far this admission. He seems to be feeling better. However at 370 lbs, baseline 350 lbs. Echo from yesterday reveals mildly reduced LV systolic function with EF of 45-50%.  Diastolic function could not be assessed. He has mild aortic stenosis. Mechanical mitral valve appeared to be functioning normally. Crea stable 0.9, K low 3.3, I will replace He wants to go home, we will plan for tomorrow,  his legs are severely swollen, however he says that's his baseline.  Continue current medications Increase Lasix to 80 mg IV TID  D/C HCTZ 12.5 mg a day Start spironolactone 25 mg po daily Increase to Losartan 100 mg a day Increase KCl to 80 mEq BID  Increase home dose of Torsemide to 40 mg BID.   2.  S/p MVR:   On coumadine :    INR is 2.26.  Pharmacy is managing   He can be discharged home today. We will arrange for a follow up in our office.   For questions or updates, please contact [place] Please consult [email or web address] for contact info under Cardiology/STEMI.      Signed, Ena Dawley, MD  01/21/2017, 9:37 AM

## 2017-01-21 NOTE — Progress Notes (Signed)
Patient and wife given discharge instructions and all questions answered.  Pt. Discharged via wheelchair with all belongings.

## 2017-01-21 NOTE — Progress Notes (Addendum)
[facility] for Coumadin Indication: mech MVR, AVR, Afib, h/o TIA  Allergies  Allergen Reactions  . Warfarin And Related Other (See Comments)    Per the patient's wife, "Warfain made him have a TIA" CAN TAKE BRAND NAME COUMADIN     Patient Measurements: Height: 6' (182.9 cm) Weight: (!) 363 lb 8 oz (164.9 kg) IBW/kg (Calculated) : 77.6  Vital Signs: Temp: 97.5 F (36.4 C) (10/14 0510) Temp Source: Oral (10/14 0510) BP: 159/57 (10/14 0510) Pulse Rate: 63 (10/14 0510)  Labs:  Recent Labs  01/19/17 0510 01/20/17 0530 01/21/17 0415  LABPROT 24.8* 23.6* 24.6*  INR 2.26 2.12 2.24  CREATININE 0.92 0.93 0.92    Estimated Creatinine Clearance: 122.3 mL/min (by C-G formula based on SCr of 0.92 mg/dL).   Medical History: Past Medical History:  Diagnosis Date  . Anxiety   . Atrial arrhythmia    status post ablation in [place] with complication including damage to the aortic valve  . Atrial fibrillation ([place])   . AV block, 1st degree   . Bradycardia   . Diastolic CHF, chronic ([place])   . Diverticulosis   . History of melanoma   . HTN (hypertension)   . Hypertrophic cardiomyopathy ([place])    s/p myoectomy  . Internal hemorrhoids   . mitral valve replacement-St. Jude's mechanical   . Morbid obesity ([place])   . Pacemaker  -SJM   . S/P aortic valve repair   . TIA (transient ischemic attack) 2001  . Tubulovillous adenoma     Medications:  Prescriptions Prior to Admission  Medication Sig Dispense Refill Last Dose  . Ascorbic Acid (VITAMIN C) 1000 MG tablet Take 2,000 mg by mouth daily.     01/17/2017 at Unknown time  . aspirin 81 MG tablet Take 81 mg by mouth daily.   01/17/2017 at 0600  . COUMADIN 10 MG tablet TAKE AS DIRECTED BY COUMADIN CLINIC (Patient taking differently: Take 10 mg by mouth in the morning on Sun/Mon/Tues/Thurs/Fri/Sat and 15 mg on Wed) 100 tablet 1 01/17/2017 at 0600  . fish oil-omega-3 fatty acids 1000 MG capsule  Take 4 g by mouth every morning.    01/17/2017 at am  . KLOR-CON 10 10 MEQ tablet Take 8 tablets (80 meq) by mouth once daily (Patient taking differently: Take 80 mEq by mouth every morning. NDC: [phone]) 720 tablet 3 01/17/2017 at am  . losartan-hydrochlorothiazide (HYZAAR) 50-12.5 MG tablet Take 1 tablet by mouth daily. 90 tablet 1 01/17/2017 at am  . Magnesium Oxide 250 MG TABS Take 1 tablet (250 mg total) by mouth 3 (three) times daily. 270 tablet 3 01/17/2017 at pm  . Multiple Vitamin (MULTIVITAMIN) tablet Take 1 tablet by mouth daily.     01/17/2017 at am  . [DISCONTINUED] torsemide (DEMADEX) 20 MG tablet Take 2 tablets (40 mg total) by mouth daily. (Patient taking differently: Take 40 mg by mouth every morning. ) 180 tablet 3 01/17/2017 at am   Scheduled:  . aspirin EC  81 mg Oral Daily  . losartan  100 mg Oral Daily  . magnesium oxide  200 mg Oral TID  . multivitamin with minerals  1 tablet Oral Daily  . omega-3 acid ethyl esters  4 g Oral q morning - 10a  . potassium chloride  20 mEq Oral BID  . spironolactone  25 mg Oral Daily  . torsemide  40 mg Oral BID  . vitamin C  2,000 mg Oral Daily  . Warfarin -  Pharmacist Dosing Inpatient   Does not apply q1800    Assessment: 71yo male with history of AVR, afib, hx TIA continuing on Coumadin per Pharmacy during admission; current INR below goal - increased to 2.24 after higher doses last 2 nights. Spoke with Dr. Starla Link 10/12 - no need to bridge at this time. CBC stable. No bleed documented.  PTA warfarin dose: 10mg  daily except 15mg  on Wed (last dose 10/10 PTA)  Goal of Therapy:  INR 2.5-3.5  Monitor platelets by anticoagulation protocol:  yes   Plan:  Patient discharging today - to f/u with Cardiology outpatient   Elicia Lamp, PharmD, BCPS Clinical Pharmacist Rx Phone # for today: [phone] After 3:30PM, please call Main Rx: #[id number] 01/21/2017 11:29 AM

## 2017-01-22 LAB — URINE CULTURE: Culture: >=100000 — AB

## 2017-01-24 ENCOUNTER — Encounter: Payer: Self-pay | Admitting: Physician Assistant

## 2017-01-25 ENCOUNTER — Telehealth (HOSPITAL_COMMUNITY): Payer: Self-pay

## 2017-01-25 NOTE — Telephone Encounter (Signed)
Patient was discharged from hospital on 01/21/17 - Has f/u appt on 02/07/17 w/Dr.Klein. Will call patient in regards to Pulmonary Rehab after appt.

## 2017-01-26 ENCOUNTER — Ambulatory Visit (INDEPENDENT_AMBULATORY_CARE_PROVIDER_SITE_OTHER): Payer: Medicare Other | Admitting: *Deleted

## 2017-01-26 DIAGNOSIS — I4891 Unspecified atrial fibrillation: Secondary | ICD-10-CM | POA: Diagnosis not present

## 2017-01-26 DIAGNOSIS — G459 Transient cerebral ischemic attack, unspecified: Secondary | ICD-10-CM

## 2017-01-26 DIAGNOSIS — Z5181 Encounter for therapeutic drug level monitoring: Secondary | ICD-10-CM

## 2017-01-26 DIAGNOSIS — Z9889 Other specified postprocedural states: Secondary | ICD-10-CM

## 2017-01-26 LAB — BASIC METABOLIC PANEL
BUN/Creatinine Ratio: 17 (ref 10–24)
BUN: 15 mg/dL (ref 8–27)
CALCIUM: 9.7 mg/dL (ref 8.6–10.2)
CHLORIDE: 97 mmol/L (ref 96–106)
CO2: 25 mmol/L (ref 20–29)
Creatinine, Ser: 0.87 mg/dL (ref 0.76–1.27)
GFR calc non Af Amer: 89 mL/min/{1.73_m2} (ref >59)
GFR, EST AFRICAN AMERICAN: 103 mL/min/{1.73_m2} (ref >59)
Glucose: 89 mg/dL (ref 65–99)
Potassium: 4.6 mmol/L (ref 3.5–5.2)
Sodium: 139 mmol/L (ref 134–144)

## 2017-01-26 LAB — POCT INR: INR: 2.5

## 2017-01-26 LAB — MAGNESIUM: Magnesium: 2.5 mg/dL — ABNORMAL HIGH (ref 1.6–2.3)

## 2017-01-30 ENCOUNTER — Ambulatory Visit (INDEPENDENT_AMBULATORY_CARE_PROVIDER_SITE_OTHER): Payer: Medicare Other | Admitting: Family Medicine

## 2017-01-30 ENCOUNTER — Encounter: Payer: Self-pay | Admitting: Family Medicine

## 2017-01-30 VITALS — BP 130/78 | HR 60 | Temp 98.2°F | Ht 70.0 in | Wt 350.5 lb

## 2017-01-30 DIAGNOSIS — I10 Essential (primary) hypertension: Secondary | ICD-10-CM

## 2017-01-30 DIAGNOSIS — Z6841 Body Mass Index (BMI) 40.0 and over, adult: Secondary | ICD-10-CM

## 2017-01-30 DIAGNOSIS — I5033 Acute on chronic diastolic (congestive) heart failure: Secondary | ICD-10-CM

## 2017-01-30 DIAGNOSIS — Z7689 Persons encountering health services in other specified circumstances: Secondary | ICD-10-CM | POA: Diagnosis not present

## 2017-01-30 NOTE — Progress Notes (Signed)
Patient presents to clinic today to establish care.  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  Pt was formerly seen by [facility] [address]  Patient is followed by Dr. Klein-Cardiology, Dr. Delrae Rend, Dr. Alphonsa Gin  Pt was recently admitted to the hospital for CHF exacerbation from 10/10-10/14.  Pt states he was in [place], when he wasn't feeling quite right and his wt. was up.  Pt drove down to [place], had an appt with one of his specialist, then was put in the hospital.  Pt states since his d/c he has been feeling better.  His torsemide and Klor-con were doubled.  Pt continues to take Coumadin 10 mg 6 days/wk, and 15 on Wed, losartan 100 mg, spironolactone 25 mg, mag ox, ASA 37m, fish oil, and MVI.    He wants to loose wt.  Pt has been weighing himself daily and knows that if his wt increases by 2-3 lbs he should be concerned.  He hopes to get down to 340lbs soon.  Pt has been logging his intake, keeping track of calories, sodium, and % of fat from calories. Pt also wants to start walking more.     Allergies:  Cannot take generic Coumadin---does not work for him  PSurgHx: 1999 Open heart Surgery for mechanical mitral valve 2008 Pacemaker placement for AV node block.  Social Hx: Pt is married.  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  He has since been retired for 5 yrs.  He is currently employed as a cChief Strategy Officerfor [employer]  Pt does not use tobacco, EtOH, or drugs.  FMHx: Mom- DM Dad- colon ca, Heart Dz. Sister, DButch Penny desc, cancer-tumor in "male organs", early death, Heart dz.---had a pig valve, weighed >500lbs Sister, KNunzio Cory heart dz., miscarriage Brother, PArnette Norris heart dz. Daughter- depression, heart dz Son- adopted MGM-lung  cancer, early death at 69yo had 115kids   Health Maintenance: Vision -- 2015 Immunizations -- discuss at this visit Colonoscopy --2018  Past Medical History:  Diagnosis Date   Anxiety    Atrial arrhythmia    [place]   Atrial fibrillation (HCC)    AV block, 1st degree    Bradycardia    Diastolic CHF, chronic (HCC)    Diverticulosis    History of melanoma    HTN (hypertension)    Hypertrophic cardiomyopathy ([place]    s/p myoectomy   Internal hemorrhoids    mitral valve replacement-St. Jude's mechanical    Morbid obesity ([place]    Pacemaker  -SJM    S/P aortic valve repair    TIA (transient ischemic attack) 2001   Tubulovillous adenoma     Past Surgical History:  Procedure Laterality Date   AORTIC VALVE REPLACEMENT     mechanical-following traumatic injury during an ablation procedure   COLONOSCOPY     COLONOSCOPY WITH PROPOFOL N/A 04/13/2016   Procedure: COLONOSCOPY WITH PROPOFOL;  Surgeon: CGatha Mayer MD;  Location: [facility];  Service: Endoscopy;  Laterality: N/A;   MITRAL VALVE REPLACEMENT     mechanical   PACEMAKER INSERTION     St Jude Accent    septal myectomy      Current Outpatient Prescriptions on File Prior to Visit  Medication Sig  Dispense Refill   Ascorbic Acid (VITAMIN C) 1000 MG tablet Take 2,000 mg by mouth daily.       aspirin 81 MG tablet Take 81 mg by mouth daily.     COUMADIN 10 MG tablet TAKE AS DIRECTED BY COUMADIN CLINIC (Patient taking differently: Take 10 mg by mouth in the morning on Sun/Mon/Tues/Thurs/Fri/Sat and 15 mg on Wed) 100 tablet 1   fish oil-omega-3 fatty acids 1000 MG capsule Take 4 g by mouth every morning.      KLOR-CON 10 10 MEQ tablet Take 8 tablets (80 meq) by mouth once daily (Patient taking differently: Take 80 mEq by mouth every morning. NDC: [phone]) 720 tablet 3   losartan (COZAAR) 100 MG tablet Take 1  tablet (100 mg total) by mouth daily. 30 tablet 0   Magnesium Oxide 250 MG TABS Take 1 tablet (250 mg total) by mouth 3 (three) times daily. 270 tablet 3   Multiple Vitamin (MULTIVITAMIN) tablet Take 1 tablet by mouth daily.       spironolactone (ALDACTONE) 25 MG tablet Take 1 tablet (25 mg total) by mouth daily. 30 tablet 0   torsemide (DEMADEX) 20 MG tablet Take 2 tablets (40 mg total) by mouth 2 (two) times daily.     No current facility-administered medications on file prior to visit.     Allergies  Allergen Reactions   Warfarin And Related Other (See Comments)    Per the patient's wife, "Warfain made him have a TIA" CAN TAKE BRAND NAME COUMADIN     Family History  Problem Relation Age of Onset   Heart disease Sister    Uterine cancer Sister    Obesity Sister        500+ lbs   Heart disease Brother    Heart disease Sister    Heart disease Father    Colon cancer Father 30   Crohn's disease Paternal Grandmother     Social History   Social History   Marital status: Married    Spouse name: N/A   Number of children: 1   Years of education: N/A   Occupational History   Corporate investment banker, retired Production designer, theatre/television/film [employer]   Social History Main Topics   Smoking status: Former Smoker    Types: Cigarettes    Quit date: 04/10/1977   Smokeless tobacco: Never Used   Alcohol use Yes     Comment: occasional   Drug use: No   Sexual activity: Not on file   Other Topics Concern   Not on file   Social History Narrative   Married, one adopted son and one daughter. 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   2 caffeinated beverages daily    ROS General: Denies fever, chills, night sweats, changes in weight, changes in appetite HEENT: Denies headaches, ear pain, changes in vision, rhinorrhea, sore throat CV: Denies CP, palpitations, SOB, orthopnea Pulm: Denies SOB, cough, wheezing GI: Denies abdominal pain, nausea,  vomiting, diarrhea, constipation GU: Denies dysuria, hematuria, frequency, vaginal discharge Msk: Denies muscle cramps, joint pains Neuro: Denies weakness, numbness, tingling Skin: Denies rashes, bruising Psych: Denies depression, anxiety, hallucinations  BP 130/78 (BP Location: Left Arm, Patient Position: Sitting, Cuff Size: Large)    Pulse 60    Temp 98.2 F (36.8 C) (Oral)    Ht 5' 10"  (1.778 m)    Wt (!) 350 lb 8 oz (159 kg)    BMI 50.29 kg/m   Physical Exam  Gen. Pleasant, well developed, well-nourished, in NAD HEENT - [place]/AT, PERRL, EOMI, conjunctive clear, no scleral icterus, no nasal drainage, pharynx without erythema or exudate. Neck: No JVD, no thyromegaly, no carotid bruits Lungs: no use of accessory muscles, no dullness to percussion, CTAB, no wheezes, rales or rhonchi Cardiovascular: RRR, 2/6 murmur, 1+ pitting edema- RLE>LLE Abdomen: BS present, soft, nontender,nondistended, no hepatosplenomegaly Musculoskeletal: No deformities, moves all four extremities, no cyanosis or clubbing, normal tone Neuro:  A&Ox3, CN II-XII intact, normal gait Skin:  Warm, dry, intact, no lesions Psych: normal affect, mood appropriate   Recent Results (from the past 2160 hour(s))  CUP PACEART REMOTE DEVICE CHECK     Status: None   Collection Time: 11/10/16  2:54 AM  Result Value Ref Range   Date Time Interrogation Session [id number]    Pulse Generator Manufacturer SJCR    Pulse Gen Model 2210 Accent DR RF    Pulse Gen Serial Number [id number]    Clinic Name [facility]    Implantable Pulse Generator Type Implantable Pulse Generator    Implantable Pulse Generator Implant Date 42595638    Implantable Lead Manufacturer [facility] [employer]    Implantable Lead Model [id number] Tendril STS    Implantable Lead Serial Number [id number]    Implantable Lead Implant Date [id number]    Implantable Lead Location Detail 1 APPENDAGE    Implantable Lead Location [id number]    Implantable Lead Manufacturer [facility]     Implantable Lead Model [id number] Tendril STS    Implantable Lead Serial Number [id number]    Implantable Lead Implant Date 16010932    Implantable Lead Location Detail 1 APEX    Implantable Lead Location [id number]    Lead Channel Setting Sensing Sensitivity 2.0 mV   Lead Channel Setting Sensing Adaptation Mode Fixed Pacing    Lead Channel Setting Pacing Amplitude 2.0 V   Lead Channel Setting Pacing Pulse Width 0.4 ms   Lead Channel Setting Pacing Amplitude 2.5 V   Lead Channel Status     Lead Channel Impedance Value 400 ohm   Lead Channel Sensing Intrinsic Amplitude 1.0 mV   Lead Channel Pacing Threshold Amplitude 0.75 V   Lead Channel Pacing Threshold Pulse Width 0.4 ms   Lead Channel Status     Lead Channel Impedance Value 540 ohm   Lead Channel Sensing Intrinsic Amplitude 11.7 mV   Lead Channel Pacing Threshold Amplitude 0.75 V   Lead Channel Pacing Threshold Pulse Width 0.4 ms   Battery Status MOS    Battery Remaining Longevity 9 mo   Battery Remaining Percentage 8.0 %   Battery Voltage 2.69 V   Brady Statistic RA Percent Paced 87.0 %   Brady Statistic RV Percent Paced 97.0 %   Brady Statistic AP VP Percent 87.0 %   Brady Statistic AS VP Percent 10.0 %   Brady Statistic AP VS Percent 1.0 %   Brady Statistic AS VS Percent 1.0 %   Eval Rhythm ApVp w/PACs   POCT INR     Status: None   Collection Time: 11/17/16  9:18 AM  Result Value Ref Range   INR 3.6   POCT INR     Status: None   Collection Time: 12/15/16  9:14 AM  Result Value Ref Range   INR 2.8   Basic metabolic panel     Status: Abnormal   Collection Time: 01/17/17  5:06 PM  Result Value Ref Range   Sodium 134 (L) 135 - 145 mmol/L   Potassium 4.0 3.5 -  5.1 mmol/L   Chloride 96 (L) 101 - 111 mmol/L   CO2 28 22 - 32 mmol/L   Glucose, Bld 161 (H) 65 - 99 mg/dL   BUN 19 6 - 20 mg/dL   Creatinine, Ser 1.03 0.61 - 1.24 mg/dL   Calcium 8.9 8.9 - 10.3 mg/dL   GFR calc non Af Amer >60 >60 mL/min   GFR calc Af Amer  >60 >60 mL/min    Comment: (NOTE) The eGFR has been calculated using the CKD EPI equation. This calculation has not been validated in all clinical situations. eGFR's persistently <60 mL/min signify possible Chronic Kidney Disease.    Anion gap 10 5 - 15  CBC     Status: Abnormal   Collection Time: 01/17/17  5:06 PM  Result Value Ref Range   WBC 8.8 4.0 - 10.5 K/uL   RBC 3.96 (L) 4.22 - 5.81 MIL/uL   Hemoglobin 12.9 (L) 13.0 - 17.0 g/dL   HCT 40.0 39.0 - 52.0 %   MCV 101.0 (H) 78.0 - 100.0 fL   MCH 32.6 26.0 - 34.0 pg   MCHC 32.3 30.0 - 36.0 g/dL   RDW 15.1 11.5 - 15.5 %   Platelets 217 150 - 400 K/uL  Brain natriuretic peptide     Status: None   Collection Time: 01/17/17  5:06 PM  Result Value Ref Range   B Natriuretic Peptide 75.4 0.0 - 100.0 pg/mL  Protime-INR     Status: Abnormal   Collection Time: 01/17/17  5:06 PM  Result Value Ref Range   Prothrombin Time 28.2 (H) 11.4 - 15.2 seconds   INR 2.67   Magnesium     Status: None   Collection Time: 01/17/17  5:06 PM  Result Value Ref Range   Magnesium 2.1 1.7 - 2.4 mg/dL  I-stat troponin, ED     Status: None   Collection Time: 01/17/17  5:11 PM  Result Value Ref Range   Troponin i, poc 0.01 0.00 - 0.08 ng/mL   Comment 3            Comment: Due to the release kinetics of cTnI, a negative result within the first hours of the onset of symptoms does not rule out myocardial infarction with certainty. If myocardial infarction is still suspected, repeat the test at appropriate intervals.   Urinalysis, Routine w reflex microscopic     Status: Abnormal   Collection Time: 01/18/17 12:18 AM  Result Value Ref Range   Color, Urine YELLOW YELLOW   APPearance CLEAR CLEAR   Specific Gravity, Urine 1.010 1.005 - 1.030   pH 6.0 5.0 - 8.0   Glucose, UA NEGATIVE NEGATIVE mg/dL   Hgb urine dipstick NEGATIVE NEGATIVE   Bilirubin Urine NEGATIVE NEGATIVE   Ketones, ur NEGATIVE NEGATIVE mg/dL   Protein, ur NEGATIVE NEGATIVE mg/dL    Nitrite NEGATIVE NEGATIVE   Leukocytes, UA MODERATE (A) NEGATIVE   RBC / HPF 0-5 0 - 5 RBC/hpf   WBC, UA 6-30 0 - 5 WBC/hpf   Bacteria, UA MANY (A) NONE SEEN   Squamous Epithelial / LPF NONE SEEN NONE SEEN  Sedimentation rate     Status: None   Collection Time: 01/18/17  3:05 AM  Result Value Ref Range   Sed Rate 12 0 - 16 mm/hr  CBC     Status: Abnormal   Collection Time: 01/18/17  3:05 AM  Result Value Ref Range   WBC 8.8 4.0 - 10.5 K/uL   RBC 3.92 (L)  4.22 - 5.81 MIL/uL   Hemoglobin 12.4 (L) 13.0 - 17.0 g/dL   HCT 39.7 39.0 - 52.0 %   MCV 101.3 (H) 78.0 - 100.0 fL   MCH 31.6 26.0 - 34.0 pg   MCHC 31.2 30.0 - 36.0 g/dL   RDW 15.3 11.5 - 15.5 %   Platelets 233 150 - 400 K/uL  Magnesium     Status: None   Collection Time: 01/18/17  3:05 AM  Result Value Ref Range   Magnesium 2.2 1.7 - 2.4 mg/dL  TSH     Status: None   Collection Time: 01/18/17  3:05 AM  Result Value Ref Range   TSH 3.296 0.350 - 4.500 uIU/mL    Comment: Performed by a 3rd Generation assay with a functional sensitivity of <=0.01 uIU/mL.  ANA w/Reflex if Positive     Status: None   Collection Time: 01/18/17  3:05 AM  Result Value Ref Range   Anit Nuclear Antibody(ANA) Negative Negative    Comment: (NOTE) Performed At: [facility] [place], [place] [id number] Lindon Romp MD CM:[id number]   Protime-INR     Status: Abnormal   Collection Time: 01/18/17  4:27 AM  Result Value Ref Range   Prothrombin Time 26.6 (H) 11.4 - 15.2 seconds   INR 2.48   Culture, Urine     Status: Abnormal   Collection Time: 01/18/17  4:52 AM  Result Value Ref Range   Specimen Description Urine    Special Requests NONE    Culture (A)     >=100,000 COLONIES/mL GROUP B STREP(S.AGALACTIAE)ISOLATED >=100,000 COLONIES/mL ENTEROCOCCUS FAECALIS TESTING AGAINST S. AGALACTIAE NOT ROUTINELY PERFORMED DUE TO PREDICTABILITY OF AMP/PEN/VAN SUSCEPTIBILITY.    Report Status 01/22/2017 FINAL    Organism ID,  Bacteria ENTEROCOCCUS FAECALIS (A)       Susceptibility   Enterococcus faecalis - MIC*    AMPICILLIN <=2 SENSITIVE Sensitive     VANCOMYCIN 1 SENSITIVE Sensitive     GENTAMICIN SYNERGY SENSITIVE Sensitive     * >=100,000 COLONIES/mL ENTEROCOCCUS FAECALIS  Hepatic function panel     Status: Abnormal   Collection Time: 01/18/17  7:17 AM  Result Value Ref Range   Total Protein 7.3 6.5 - 8.1 g/dL   Albumin 3.8 3.5 - 5.0 g/dL   AST 30 15 - 41 U/L   ALT 15 (L) 17 - 63 U/L   Alkaline Phosphatase 77 38 - 126 U/L   Total Bilirubin 1.4 (H) 0.3 - 1.2 mg/dL   Bilirubin, Direct 0.3 0.1 - 0.5 mg/dL   Indirect Bilirubin 1.1 (H) 0.3 - 0.9 mg/dL  Rocky mtn spotted fvr abs pnl(IgG+IgM)     Status: None   Collection Time: 01/18/17  7:17 AM  Result Value Ref Range   RMSF IgG Negative Negative   RMSF IgM 0.37 0.00 - 0.89 index    Comment: (NOTE)                                 Negative        <0.90                                 Equivocal 0.90 - 1.10  Positive        >1.10 Performed At: [facility] [place], [place] [id number] Lindon Romp MD [id number]   B. Claris Che antibodies     Status: None   Collection Time: 01/18/17  7:17 AM  Result Value Ref Range   B burgdorferi Ab IgG+IgM <0.91 0.00 - 0.90 ISR    Comment: (NOTE)                                Negative         <0.91                                Equivocal  0.91 - 1.09                                Positive         >1.09 Performed At: [facility] [place], [place] [id number] Lindon Romp MD [id number]   ECHOCARDIOGRAM COMPLETE     Status: None   Collection Time: 01/18/17  4:39 PM  Result Value Ref Range   Weight 6,036.8 oz   Height 72 in   BP 160/62 mmHg  Basic metabolic panel     Status: Abnormal   Collection Time: 01/19/17  5:10 AM  Result Value Ref Range   Sodium 138 135 - 145 mmol/L   Potassium 3.3 (L) 3.5 - 5.1 mmol/L     Comment: DELTA CHECK NOTED   Chloride 98 (L) 101 - 111 mmol/L   CO2 30 22 - 32 mmol/L   Glucose, Bld 93 65 - 99 mg/dL   BUN 16 6 - 20 mg/dL   Creatinine, Ser 0.92 0.61 - 1.24 mg/dL   Calcium 9.0 8.9 - 10.3 mg/dL   GFR calc non Af Amer >60 >60 mL/min   GFR calc Af Amer >60 >60 mL/min    Comment: (NOTE) The eGFR has been calculated using the CKD EPI equation. This calculation has not been validated in all clinical situations. eGFR's persistently <60 mL/min signify possible Chronic Kidney Disease.    Anion gap 10 5 - 15  Protime-INR     Status: Abnormal   Collection Time: 01/19/17  5:10 AM  Result Value Ref Range   Prothrombin Time 24.8 (H) 11.4 - 15.2 seconds   INR 2.26   Magnesium     Status: None   Collection Time: 01/19/17  5:10 AM  Result Value Ref Range   Magnesium 2.3 1.7 - 2.4 mg/dL  Basic metabolic panel     Status: Abnormal   Collection Time: 01/20/17  5:30 AM  Result Value Ref Range   Sodium 140 135 - 145 mmol/L   Potassium 3.3 (L) 3.5 - 5.1 mmol/L   Chloride 98 (L) 101 - 111 mmol/L   CO2 33 (H) 22 - 32 mmol/L   Glucose, Bld 97 65 - 99 mg/dL   BUN 19 6 - 20 mg/dL   Creatinine, Ser 0.93 0.61 - 1.24 mg/dL   Calcium 9.2 8.9 - 10.3 mg/dL   GFR calc non Af Amer >60 >60 mL/min   GFR calc Af Amer >60 >60 mL/min    Comment: (NOTE) The eGFR has been calculated using the CKD EPI equation. This calculation has not been validated in all clinical situations. eGFR's  persistently <60 mL/min signify possible Chronic Kidney Disease.    Anion gap 9 5 - 15  Protime-INR     Status: Abnormal   Collection Time: 01/20/17  5:30 AM  Result Value Ref Range   Prothrombin Time 23.6 (H) 11.4 - 15.2 seconds   INR 2.12   Magnesium     Status: None   Collection Time: 01/20/17  5:30 AM  Result Value Ref Range   Magnesium 2.3 1.7 - 2.4 mg/dL  Basic metabolic panel     Status: Abnormal   Collection Time: 01/21/17  4:15 AM  Result Value Ref Range   Sodium 140 135 - 145 mmol/L    Potassium 3.6 3.5 - 5.1 mmol/L   Chloride 99 (L) 101 - 111 mmol/L   CO2 33 (H) 22 - 32 mmol/L   Glucose, Bld 96 65 - 99 mg/dL   BUN 19 6 - 20 mg/dL   Creatinine, Ser 0.92 0.61 - 1.24 mg/dL   Calcium 9.0 8.9 - 10.3 mg/dL   GFR calc non Af Amer >60 >60 mL/min   GFR calc Af Amer >60 >60 mL/min    Comment: (NOTE) The eGFR has been calculated using the CKD EPI equation. This calculation has not been validated in all clinical situations. eGFR's persistently <60 mL/min signify possible Chronic Kidney Disease.    Anion gap 8 5 - 15  Protime-INR     Status: Abnormal   Collection Time: 01/21/17  4:15 AM  Result Value Ref Range   Prothrombin Time 24.6 (H) 11.4 - 15.2 seconds   INR 2.24   Magnesium     Status: None   Collection Time: 01/21/17  4:15 AM  Result Value Ref Range   Magnesium 2.3 1.7 - 2.4 mg/dL  POCT INR     Status: None   Collection Time: 01/26/17  9:21 AM  Result Value Ref Range   INR 2.5   Basic Metabolic Panel (BMET)     Status: None   Collection Time: 01/26/17  9:42 AM  Result Value Ref Range   Glucose 89 65 - 99 mg/dL   BUN 15 8 - 27 mg/dL   Creatinine, Ser 0.87 0.76 - 1.27 mg/dL   GFR calc non Af Amer 89 >59 mL/min/1.73   GFR calc Af Amer 103 >59 mL/min/1.73   BUN/Creatinine Ratio 17 10 - 24   Sodium 139 134 - 144 mmol/L   Potassium 4.6 3.5 - 5.2 mmol/L   Chloride 97 96 - 106 mmol/L   CO2 25 20 - 29 mmol/L   Calcium 9.7 8.6 - 10.2 mg/dL  Magnesium     Status: Abnormal   Collection Time: 01/26/17  9:42 AM  Result Value Ref Range   Magnesium 2.5 (H) 1.6 - 2.3 mg/dL    Assessment/Plan: Essential hypertension -controlled -Continue losartan 100 mg and spironolactone 25 mg  Acute on chronic diastolic CHF (congestive heart failure) (HCC) -continue daily weight checks, contact Cards if 2-3 lbs increase in wt. -Continue losartan 100 mg, spironolactone 25 mg, torsemide 20 mg -Continue f/u with Cards  BMI 50.0-59.9 -current BMI 50.0 -Discussed ways to increase  physical activity, such as walking and water aerobics. -Continue keeping log of food intake.  Continue 1500 calorie diet, Sodium goal 1000 mg daily.  Encounter to establish care -We reviewed the PMH, PSH, FH, SH, Meds and Allergies. -We provided refills for any medications we will prescribe as needed. -We addressed current concerns per orders and patient instructions. -We have asked for records  for pertinent exams, studies, vaccines and notes from previous providers. -We have advised patient to follow up per instructions below.     F/u in 1-2 months, sooner if needed.

## 2017-01-30 NOTE — Patient Instructions (Signed)
DASH Eating Plan DASH stands for "Dietary Approaches to Stop Hypertension." The DASH eating plan is a healthy eating plan that has been shown to reduce high blood pressure (hypertension). It may also reduce your risk for type 2 diabetes, heart disease, and stroke. The DASH eating plan may also help with weight loss. What are tips for following this plan? General guidelines  Avoid eating more than 2,300 mg (milligrams) of salt (sodium) a day. If you have hypertension, you may need to reduce your sodium intake to 1,500 mg a day.  Limit alcohol intake to no more than 1 drink a day for nonpregnant women and 2 drinks a day for men. One drink equals 12 oz of beer, 5 oz of wine, or 1 oz of hard liquor.  Work with your health care provider to maintain a healthy body weight or to lose weight. Ask what an ideal weight is for you.  Get at least 30 minutes of exercise that causes your heart to beat faster (aerobic exercise) most days of the week. Activities may include walking, swimming, or biking.  Work with your health care provider or diet and nutrition specialist (dietitian) to adjust your eating plan to your individual calorie needs. Reading food labels  Check food labels for the amount of sodium per serving. Choose foods with less than 5 percent of the Daily Value of sodium. Generally, foods with less than 300 mg of sodium per serving fit into this eating plan.  To find whole grains, look for the word "whole" as the first word in the ingredient list. Shopping  Buy products labeled as "low-sodium" or "no salt added."  Buy fresh foods. Avoid canned foods and premade or frozen meals. Cooking  Avoid adding salt when cooking. Use salt-free seasonings or herbs instead of table salt or sea salt. Check with your health care provider or pharmacist before using salt substitutes.  Do not fry foods. Cook foods using healthy methods such as baking, boiling, grilling, and broiling instead.  Cook with  heart-healthy oils, such as olive, canola, soybean, or sunflower oil. Meal planning   Eat a balanced diet that includes: ? 5 or more servings of fruits and vegetables each day. At each meal, try to fill half of your plate with fruits and vegetables. ? Up to 6-8 servings of whole grains each day. ? Less than 6 oz of lean meat, poultry, or fish each day. A 3-oz serving of meat is about the same size as a deck of cards. One egg equals 1 oz. ? 2 servings of low-fat dairy each day. ? A serving of nuts, seeds, or beans 5 times each week. ? Heart-healthy fats. Healthy fats called Omega-3 fatty acids are found in foods such as flaxseeds and coldwater fish, like sardines, salmon, and mackerel.  Limit how much you eat of the following: ? Canned or prepackaged foods. ? Food that is high in trans fat, such as fried foods. ? Food that is high in saturated fat, such as fatty meat. ? Sweets, desserts, sugary drinks, and other foods with added sugar. ? Full-fat dairy products.  Do not salt foods before eating.  Try to eat at least 2 vegetarian meals each week.  Eat more home-cooked food and less restaurant, buffet, and fast food.  When eating at a restaurant, ask that your food be prepared with less salt or no salt, if possible. What foods are recommended? The items listed may not be a complete list. Talk with your dietitian about what   dietary choices are best for you. Grains Whole-grain or whole-wheat bread. Whole-grain or whole-wheat pasta. Brown rice. Oatmeal. Quinoa. Bulgur. Whole-grain and low-sodium cereals. Pita bread. Low-fat, low-sodium crackers. Whole-wheat flour tortillas. Vegetables Fresh or frozen vegetables (raw, steamed, roasted, or grilled). Low-sodium or reduced-sodium tomato and vegetable juice. Low-sodium or reduced-sodium tomato sauce and tomato paste. Low-sodium or reduced-sodium canned vegetables. Fruits All fresh, dried, or frozen fruit. Canned fruit in natural juice (without  added sugar). Meat and other protein foods Skinless chicken or turkey. Ground chicken or turkey. Pork with fat trimmed off. Fish and seafood. Egg whites. Dried beans, peas, or lentils. Unsalted nuts, nut butters, and seeds. Unsalted canned beans. Lean cuts of beef with fat trimmed off. Low-sodium, lean deli meat. Dairy Low-fat (1%) or fat-free (skim) milk. Fat-free, low-fat, or reduced-fat cheeses. Nonfat, low-sodium ricotta or cottage cheese. Low-fat or nonfat yogurt. Low-fat, low-sodium cheese. Fats and oils Soft margarine without trans fats. Vegetable oil. Low-fat, reduced-fat, or light mayonnaise and salad dressings (reduced-sodium). Canola, safflower, olive, soybean, and sunflower oils. Avocado. Seasoning and other foods Herbs. Spices. Seasoning mixes without salt. Unsalted popcorn and pretzels. Fat-free sweets. What foods are not recommended? The items listed may not be a complete list. Talk with your dietitian about what dietary choices are best for you. Grains Baked goods made with fat, such as croissants, muffins, or some breads. Dry pasta or rice meal packs. Vegetables Creamed or fried vegetables. Vegetables in a cheese sauce. Regular canned vegetables (not low-sodium or reduced-sodium). Regular canned tomato sauce and paste (not low-sodium or reduced-sodium). Regular tomato and vegetable juice (not low-sodium or reduced-sodium). Pickles. Olives. Fruits Canned fruit in a light or heavy syrup. Fried fruit. Fruit in cream or butter sauce. Meat and other protein foods Fatty cuts of meat. Ribs. Fried meat. Bacon. Sausage. Bologna and other processed lunch meats. Salami. Fatback. Hotdogs. Bratwurst. Salted nuts and seeds. Canned beans with added salt. Canned or smoked fish. Whole eggs or egg yolks. Chicken or turkey with skin. Dairy Whole or 2% milk, cream, and half-and-half. Whole or full-fat cream cheese. Whole-fat or sweetened yogurt. Full-fat cheese. Nondairy creamers. Whipped toppings.  Processed cheese and cheese spreads. Fats and oils Butter. Stick margarine. Lard. Shortening. Ghee. Bacon fat. Tropical oils, such as coconut, palm kernel, or palm oil. Seasoning and other foods Salted popcorn and pretzels. Onion salt, garlic salt, seasoned salt, table salt, and sea salt. Worcestershire sauce. Tartar sauce. Barbecue sauce. Teriyaki sauce. Soy sauce, including reduced-sodium. Steak sauce. Canned and packaged gravies. Fish sauce. Oyster sauce. Cocktail sauce. Horseradish that you find on the shelf. Ketchup. Mustard. Meat flavorings and tenderizers. Bouillon cubes. Hot sauce and Tabasco sauce. Premade or packaged marinades. Premade or packaged taco seasonings. Relishes. Regular salad dressings. Where to find more information:  National Heart, Lung, and Blood Institute: www.nhlbi.nih.gov  American Heart Association: www.heart.org Summary  The DASH eating plan is a healthy eating plan that has been shown to reduce high blood pressure (hypertension). It may also reduce your risk for type 2 diabetes, heart disease, and stroke.  With the DASH eating plan, you should limit salt (sodium) intake to 2,300 mg a day. If you have hypertension, you may need to reduce your sodium intake to 1,500 mg a day.  When on the DASH eating plan, aim to eat more fresh fruits and vegetables, whole grains, lean proteins, low-fat dairy, and heart-healthy fats.  Work with your health care provider or diet and nutrition specialist (dietitian) to adjust your eating plan to your individual   calorie needs. This information is not intended to replace advice given to you by your health care provider. Make sure you discuss any questions you have with your health care provider. Document Released: 03/16/2011 Document Revised: 03/20/2016 Document Reviewed: 03/20/2016 Elsevier Interactive Patient Education  2017 [place] Maintenance, Male A healthy lifestyle and preventive care is important for your  health and wellness. Ask your health care provider about what schedule of regular examinations is right for you. What should I know about weight and diet? Eat a Healthy Diet  Eat plenty of vegetables, fruits, whole grains, low-fat dairy products, and lean protein.  Do not eat a lot of foods high in solid fats, added sugars, or salt.  Maintain a Healthy Weight Regular exercise can help you achieve or maintain a healthy weight. You should:  Do at least 150 minutes of exercise each week. The exercise should increase your heart rate and make you sweat (moderate-intensity exercise).  Do strength-training exercises at least twice a week.  Watch Your Levels of Cholesterol and Blood Lipids  Have your blood tested for lipids and cholesterol every 5 years starting at 69 years of age. If you are at high risk for heart disease, you should start having your blood tested when you are 69 years old. You may need to have your cholesterol levels checked more often if: ? Your lipid or cholesterol levels are high. ? You are older than 69 years of age. ? You are at high risk for heart disease.  What should I know about cancer screening? Many types of cancers can be detected early and may often be prevented. Lung Cancer  You should be screened every year for lung cancer if: ? You are a current smoker who has smoked for at least 30 years. ? You are a former smoker who has quit within the past 15 years.  Talk to your health care provider about your screening options, when you should start screening, and how often you should be screened.  Colorectal Cancer  Routine colorectal cancer screening usually begins at 69 years of age and should be repeated every 5-10 years until you are 69 years old. You may need to be screened more often if early forms of precancerous polyps or small growths are found. Your health care provider may recommend screening at an earlier age if you have risk factors for colon  cancer.  Your health care provider may recommend using home test kits to check for hidden blood in the stool.  A small camera at the end of a tube can be used to examine your colon (sigmoidoscopy or colonoscopy). This checks for the earliest forms of colorectal cancer.  Prostate and Testicular Cancer  Depending on your age and overall health, your health care provider may do certain tests to screen for prostate and testicular cancer.  Talk to your health care provider about any symptoms or concerns you have about testicular or prostate cancer.  Skin Cancer  Check your skin from head to toe regularly.  Tell your health care provider about any new moles or changes in moles, especially if: ? There is a change in a mole's size, shape, or color. ? You have a mole that is larger than a pencil eraser.  Always use sunscreen. Apply sunscreen liberally and repeat throughout the day.  Protect yourself by wearing long sleeves, pants, a wide-brimmed hat, and sunglasses when outside.  What should I know about heart disease, diabetes, and high blood pressure?  If you are 43-40 years of age, have your blood pressure checked every 3-5 years. If you are 9 years of age or older, have your blood pressure checked every year. You should have your blood pressure measured twice-once when you are at a hospital or clinic, and once when you are not at a hospital or clinic. Record the average of the two measurements. To check your blood pressure when you are not at a hospital or clinic, you can use: ? An automated blood pressure machine at a pharmacy. ? A home blood pressure monitor.  Talk to your health care provider about your target blood pressure.  If you are between 86-51 years old, ask your health care provider if you should take aspirin to prevent heart disease.  Have regular diabetes screenings by checking your fasting blood sugar level. ? If you are at a normal weight and have a low risk for  diabetes, have this test once every three years after the age of 22. ? If you are overweight and have a high risk for diabetes, consider being tested at a younger age or more often.  A one-time screening for abdominal aortic aneurysm (AAA) by ultrasound is recommended for men aged 16-75 years who are current or former smokers. What should I know about preventing infection? Hepatitis B If you have a higher risk for hepatitis B, you should be screened for this virus. Talk with your health care provider to find out if you are at risk for hepatitis B infection. Hepatitis C Blood testing is recommended for:  Everyone born from [birth year] through 1963.  Anyone with known risk factors for hepatitis C.  Sexually Transmitted Diseases (STDs)  You should be screened each year for STDs including gonorrhea and chlamydia if: ? You are sexually active and are younger than 68 years of age. ? You are older than 69 years of age and your health care provider tells you that you are at risk for this type of infection. ? Your sexual activity has changed since you were last screened and you are at an increased risk for chlamydia or gonorrhea. Ask your health care provider if you are at risk.  Talk with your health care provider about whether you are at high risk of being infected with HIV. Your health care provider may recommend a prescription medicine to help prevent HIV infection.  What else can I do?  Schedule regular health, dental, and eye exams.  Stay current with your vaccines (immunizations).  Do not use any tobacco products, such as cigarettes, chewing tobacco, and e-cigarettes. If you need help quitting, ask your health care provider.  Limit alcohol intake to no more than 2 drinks per day. One drink equals 12 ounces of beer, 5 ounces of wine, or 1 ounces of hard liquor.  Do not use street drugs.  Do not share needles.  Ask your health care provider for help if you need support or information about  quitting drugs.  Tell your health care provider if you often feel depressed.  Tell your health care provider if you have ever been abused or do not feel safe at home. This information is not intended to replace advice given to you by your health care provider. Make sure you discuss any questions you have with your health care provider. Document Released: 09/23/2007 Document Revised: 11/24/2015 Document Reviewed: 12/29/2014 Elsevier Interactive Patient Education  2018 [place].  Heart Failure Action Plan A heart failure action plan helps you understand what to  do when you have symptoms of heart failure. Follow the plan that was created by you and your health care provider. Review your plan each time you visit your health care provider. Red zone These signs and symptoms mean you should get medical help right away:  You have trouble breathing when resting.  You have a dry cough that is getting worse.  You have swelling or pain in your legs or abdomen that is getting worse.  You suddenly gain more than 2-3 lb (0.9-1.4 kg) in a day, or more than 5 lb (2.3 kg) in one week. This amount may be more or less depending on your condition.  You have trouble staying awake or you feel confused.  You have chest pain.  You do not have an appetite.  You pass out.  If you experience any of these symptoms:  Call your local emergency services (911 in the [place] right away or seek help at the emergency department of the nearest hospital.  Yellow zone These signs and symptoms mean your condition may be getting worse and you should make some changes:  You have trouble breathing when you are active or you need to sleep with extra pillows.  You have swelling in your legs or abdomen.  You gain 2-3 lb (0.9-1.4 kg) in one day, or 5 lb (2.3 kg) in one week. This amount may be more or less depending on your condition.  You get tired easily.  You have trouble sleeping.  You have a dry cough.  If  you experience any of these symptoms:  Contact your health care provider within the next day.  Your health care provider may adjust your medicines.  Green zone These signs mean you are doing well and can continue what you are doing:  You do not have shortness of breath.  You have very little swelling or no new swelling.  Your weight is stable (no gain or loss).  You have a normal activity level.  You do not have chest pain or any other new symptoms.  Follow these instructions at home:  Take over-the-counter and prescription medicines only as told by your health care provider.  Weigh yourself daily. Your target weight is __________ lb (__________ kg). ? Call your health care provider if you gain more than __________ lb (__________ kg) in a day, or more than __________ lb (__________ kg) in one week.  Eat a heart-healthy diet. Work with a diet and nutrition specialist (dietitian) to create an eating plan that is best for you.  Keep all follow-up visits as told by your health care provider. This is important. Where to find more information:  American Heart Association: www.heart.org Summary  Follow the action plan that was created by you and your health care provider.  Get help right away if you have any symptoms in the Red zone. This information is not intended to replace advice given to you by your health care provider. Make sure you discuss any questions you have with your health care provider. Document Released: 05/06/2016 Document Revised: 05/06/2016 Document Reviewed: 05/06/2016 Elsevier Interactive Patient Education  [employer].

## 2017-02-07 ENCOUNTER — Ambulatory Visit (INDEPENDENT_AMBULATORY_CARE_PROVIDER_SITE_OTHER): Payer: Medicare Other | Admitting: Physician Assistant

## 2017-02-07 ENCOUNTER — Encounter: Payer: Self-pay | Admitting: Physician Assistant

## 2017-02-07 ENCOUNTER — Ambulatory Visit (INDEPENDENT_AMBULATORY_CARE_PROVIDER_SITE_OTHER): Payer: Medicare Other | Admitting: *Deleted

## 2017-02-07 VITALS — BP 126/58 | HR 60 | Ht 70.0 in | Wt 339.4 lb

## 2017-02-07 DIAGNOSIS — R0902 Hypoxemia: Secondary | ICD-10-CM | POA: Diagnosis not present

## 2017-02-07 DIAGNOSIS — G459 Transient cerebral ischemic attack, unspecified: Secondary | ICD-10-CM | POA: Diagnosis not present

## 2017-02-07 DIAGNOSIS — I421 Obstructive hypertrophic cardiomyopathy: Secondary | ICD-10-CM

## 2017-02-07 DIAGNOSIS — Z952 Presence of prosthetic heart valve: Secondary | ICD-10-CM

## 2017-02-07 DIAGNOSIS — Z1159 Encounter for screening for other viral diseases: Secondary | ICD-10-CM

## 2017-02-07 DIAGNOSIS — I5042 Chronic combined systolic (congestive) and diastolic (congestive) heart failure: Secondary | ICD-10-CM | POA: Diagnosis not present

## 2017-02-07 DIAGNOSIS — Z5181 Encounter for therapeutic drug level monitoring: Secondary | ICD-10-CM

## 2017-02-07 DIAGNOSIS — I480 Paroxysmal atrial fibrillation: Secondary | ICD-10-CM

## 2017-02-07 DIAGNOSIS — Z9189 Other specified personal risk factors, not elsewhere classified: Secondary | ICD-10-CM

## 2017-02-07 DIAGNOSIS — Z950 Presence of cardiac pacemaker: Secondary | ICD-10-CM | POA: Diagnosis not present

## 2017-02-07 DIAGNOSIS — I4891 Unspecified atrial fibrillation: Secondary | ICD-10-CM

## 2017-02-07 DIAGNOSIS — Z9889 Other specified postprocedural states: Secondary | ICD-10-CM

## 2017-02-07 LAB — POCT INR: INR: 2.4

## 2017-02-07 MED ORDER — TORSEMIDE 20 MG PO TABS: 40.0000 mg | ORAL_TABLET | Freq: Two times a day (BID) | ORAL | 3 refills | Status: DC

## 2017-02-07 MED ORDER — LOSARTAN POTASSIUM 100 MG PO TABS: 100.0000 mg | ORAL_TABLET | Freq: Every day | ORAL | 3 refills | Status: DC

## 2017-02-07 MED ORDER — SPIRONOLACTONE 25 MG PO TABS: 25.0000 mg | ORAL_TABLET | Freq: Every day | ORAL | 3 refills | Status: DC

## 2017-02-07 MED ORDER — POTASSIUM CHLORIDE ER 10 MEQ PO TBCR: 110.0000 meq | EXTENDED_RELEASE_TABLET | Freq: Every day | ORAL | 3 refills | Status: DC

## 2017-02-07 NOTE — Patient Instructions (Signed)
Medication Instructions:  Your physician recommends that you continue on your current medications as directed. Please refer to the Current Medication list given to you today.  REFILLS HAVE BEEN SENT IN FOR LOSARTAN, SPIRONOLACTONE, TORSEMIDE, AND POTASSIUM  Labwork: TODAY: BMET, HCV ANTIBODY  Testing/Procedures: NONE ORDERED  Follow-Up: KEEP YOUR APPOINTMENT IN JAN 2019 WITH DR. [name]  Any Other Special Instructions Will Be Listed Below (If Applicable).     If you need a refill on your cardiac medications before your next appointment, please call your pharmacy.

## 2017-02-07 NOTE — Progress Notes (Signed)
Cardiology Office Note:    Date:  02/07/2017   ID:  Laural Golden [name], DOB 12/27/1945, MRN [id number]  PCP:  [name], MD  Cardiologist:  Dr. [name]    Referring MD: No ref. provider found   Chief Complaint  Patient presents with  . Hospitalization Follow-up    Heart failure    History of Present Illness:    Frank [name] is a 71 y.o. 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    He was admitted 10/10-10/14 with decompensated heart failure.  Echocardiogram demonstrated reduced LV function with an EF of 45-50.  Frank [name] returns for follow-up.  He is here alone today.  Since discharge from the hospital, he has continued to feel better.  His weight has continued to decrease.  He has lost close to 50 pounds.  His breathing is improved.  He denies PND, syncope, dizziness, chest pain.  His lower extremity edema is improved.  Prior CV studies:   The following studies were reviewed today:  Echocardiogram 01/18/17 Septal thickness increased in a pattern of severe LVH, otherwise mild concentric LVH, EF 45-50, anteroseptal/inferoseptal/apical hypokinesis, mild aortic stenosis, mild AI, mechanical mitral valve normally functioning, moderate LAE, severe RV E, moderately induced RVSF, mild TR, PASP 22  Echocardiogram 07/28/15 EF 55-60, mild aortic stenosis, mild AI, normal appearing mechanical MVR with no perivalvular regurgitation, severe LAE, moderate RA, PASP 62  Past Medical History:  Diagnosis Date  . Anxiety   . Atrial arrhythmia    status post ablation in [place] with complication including damage to the aortic valve  . Atrial fibrillation ([place])   . AV  block, 1st degree   . Bradycardia   . Diastolic CHF, chronic ([place])   . Diverticulosis   . History of melanoma   . HTN (hypertension)   . Hypertrophic cardiomyopathy ([place])    s/p myoectomy  . Internal hemorrhoids   . mitral valve replacement-St. Jude's mechanical   . Morbid obesity ([place])   . Pacemaker  -SJM   . S/P aortic valve repair   . TIA (transient ischemic attack) 2001  . Tubulovillous adenoma     Past Surgical History:  Procedure Laterality Date  . AORTIC VALVE REPLACEMENT     mechanical-following traumatic injury during an ablation procedure  . COLONOSCOPY    . COLONOSCOPY WITH PROPOFOL N/A 04/13/2016   Procedure: COLONOSCOPY WITH PROPOFOL;  Surgeon: Gatha Mayer, MD;  Location: [facility];  Service: Endoscopy;  Laterality: N/A;  . MITRAL VALVE REPLACEMENT     mechanical  . PACEMAKER INSERTION     St Jude Accent   . septal myectomy      Current Medications: Current Meds  Medication Sig  . Ascorbic Acid (VITAMIN C) 1000 MG tablet Take 2,000 mg by mouth daily.    Marland Kitchen aspirin 81 MG tablet Take 81 mg by mouth daily.  Marland Kitchen COUMADIN 10 MG tablet TAKE AS DIRECTED BY COUMADIN CLINIC  . fish oil-omega-3 fatty acids 1000 MG capsule Take 4 g by mouth every morning.   . Magnesium Oxide 250 MG TABS Take 1 tablet (250 mg total) by mouth 3 (three) times daily.  . Multiple Vitamin (MULTIVITAMIN) tablet Take 1 tablet by mouth daily.       Allergies:   Warfarin  and related   Social History  Substance Use Topics  . Smoking status: Former Smoker    Types: Cigarettes    Quit date: 04/10/1977  . Smokeless tobacco: Never Used  . Alcohol use Yes     Comment: occasional     Family Hx: The patient's family history includes Colon cancer (age of onset: 42) in his father; Crohn's disease in his paternal grandmother; Heart disease in his brother, father, sister, and sister; Obesity in his sister; Uterine cancer in his sister.  ROS:   Please see the history of present illness.    Review  of Systems  Cardiovascular: Positive for leg swelling.  Respiratory: Positive for shortness of breath.   Gastrointestinal: Positive for hemorrhoids (with assoc bleeding).   All other systems reviewed and are negative.   EKGs/Labs/Other Test Reviewed:    EKG:  EKG is  ordered today.  The ekg ordered today demonstrates AV paced, heart rate 60  Recent Labs: 01/17/2017: B Natriuretic Peptide 75.4 01/18/2017: ALT 15; Hemoglobin 12.4; Platelets 233; TSH 3.296 01/26/2017: BUN 15; Creatinine, Ser 0.87; Magnesium 2.5; Potassium 4.6; Sodium 139   Recent Lipid Panel Lab Results  Component Value Date/Time   CHOL 176 07/28/2015 08:29 AM   TRIG 143 07/28/2015 08:29 AM   HDL 34 (L) 07/28/2015 08:29 AM   CHOLHDL 5.2 (H) 07/28/2015 08:29 AM   LDLCALC 113 07/28/2015 08:29 AM    Physical Exam:    VS:  BP (!) 126/58   Pulse 60   Ht 5\' 10"  (1.778 m)   Wt (!) 339 lb 6.4 oz (154 kg)   SpO2 93%   BMI 48.70 kg/m     Wt Readings from Last 3 Encounters:  02/07/17 (!) 339 lb 6.4 oz (154 kg)  01/30/17 (!) 350 lb 8 oz (159 kg)  01/21/17 (!) 363 lb 8 oz (164.9 kg)     Physical Exam  Constitutional: He is oriented to person, place, and time. He appears well-developed and well-nourished. No distress.  HENT:  Head: Normocephalic and atraumatic.  Neck: No JVD (at 90 degrees) present.  Cardiovascular: Normal rate and regular rhythm.   No murmur heard. Mechanical S1, normal S2  Pulmonary/Chest: Effort normal. He has no rales.  Abdominal: Soft.  Musculoskeletal: He exhibits edema (1+ bilat LE edema R>>L).  Neurological: He is alert and oriented to person, place, and time.  Skin: Skin is warm and dry.    ASSESSMENT:    1. Chronic combined systolic and diastolic heart failure ([place])   2. Hypertrophic obstructive cardiomyopathy ([place])   3. S/P mitral valve replacement-Mechanical   4. Paroxysmal atrial fibrillation ([facility])   5. Cardiac pacemaker-St.Jude   6. Hypoxia   7. Encounter for hepatitis C  virus screening test for high risk patient    PLAN:    In order of problems listed above:  1.  Chronic combined systolic and diastolic heart failure ([place]) He was recently admitted to the hospital with decompensated heart failure.  He has lost about 50 pounds with diuresis on his current dose of diuretic.  His EF was somewhat lower in the hospital on echocardiogram at 45-50%.  He is feeling much better.   He is currently New [place] Heart Association class II.  Continue current dose of ARB, aldosterone antagonist, torsemide, potassium.  Obtain follow-up basic metabolic panel today.  2.  Hypertrophic obstructive cardiomyopathy ([place]) He is status post prior myomectomy.  3.  S/P mitral valve replacement-Mechanical Normally functioning by recent echocardiogram.  Continue aspirin,  Coumadin.  Continue SBE prophylaxis.  4.  Paroxysmal atrial fibrillation ([facility]) Maintaining normal sinus rhythm.  Continue Coumadin.  5.  Cardiac pacemaker-St.Jude Continue follow-up with EP as planned.  6.  Hypoxia His oxygen saturation seems to be better.  He was never able to make it to the pulmonologist's office.  I think we can hold off on referring again at this time.  Of note, he tells me that his wife has not noticed any significant snoring or apnea in the past.  7.  Encounter for hepatitis C virus screening test for high risk patient He would like to have his hepatitis C virus checked for screening purposes.  According to the guidelines, he should be screened as his date of birth was between the years of [place].  If this is positive, I will send it on to his primary care provider for further management.   Dispo:  Return in 2 months (on 04/18/2017) for Scheduled Follow Up w/ Dr. [name].   Medication Adjustments/Labs and Tests Ordered: Current medicines are reviewed at length with the patient today.  Concerns regarding medicines are outlined above.  Tests Ordered: Orders Placed This Encounter  Procedures    . Basic Metabolic Panel (BMET)  . Hepatitis C Antibody  . EKG 12-Lead   Medication Changes: Meds ordered this encounter  Medications  . spironolactone (ALDACTONE) 25 MG tablet    Sig: Take 1 tablet (25 mg total) by mouth daily.    Dispense:  90 tablet    Refill:  3  . potassium chloride (K-DUR) 10 MEQ tablet    Sig: Take 11 tablets (110 mEq total) by mouth daily. TAKE 11 TABLETS BY MOUTH ONCE DAILY    Dispense:  990 tablet    Refill:  3  . losartan (COZAAR) 100 MG tablet    Sig: Take 1 tablet (100 mg total) by mouth daily.    Dispense:  90 tablet    Refill:  3  . torsemide (DEMADEX) 20 MG tablet    Sig: Take 2 tablets (40 mg total) by mouth 2 (two) times daily.    Dispense:  360 tablet    Refill:  3    Signed, [name], PA-C  02/07/2017 4:50 PM    [facility] [address], [place], [place]  [address] Phone: [phone]; Fax: [phone]

## 2017-02-08 ENCOUNTER — Ambulatory Visit (INDEPENDENT_AMBULATORY_CARE_PROVIDER_SITE_OTHER): Payer: Medicare Other | Admitting: *Deleted

## 2017-02-08 ENCOUNTER — Other Ambulatory Visit: Payer: Self-pay | Admitting: *Deleted

## 2017-02-08 DIAGNOSIS — G459 Transient cerebral ischemic attack, unspecified: Secondary | ICD-10-CM

## 2017-02-08 LAB — BASIC METABOLIC PANEL
BUN/Creatinine Ratio: 25 — ABNORMAL HIGH (ref 10–24)
BUN: 26 mg/dL (ref 8–27)
CO2: 26 mmol/L (ref 20–29)
CREATININE: 1.02 mg/dL (ref 0.76–1.27)
Calcium: 9.8 mg/dL (ref 8.6–10.2)
Chloride: 95 mmol/L — ABNORMAL LOW (ref 96–106)
GFR calc non Af Amer: 75 mL/min/{1.73_m2} (ref >59)
GFR, EST AFRICAN AMERICAN: 87 mL/min/{1.73_m2} (ref >59)
GLUCOSE: 91 mg/dL (ref 65–99)
Potassium: 4.3 mmol/L (ref 3.5–5.2)
Sodium: 139 mmol/L (ref 134–144)

## 2017-02-08 LAB — HEPATITIS C ANTIBODY

## 2017-02-08 MED ORDER — TORSEMIDE 20 MG PO TABS
40.0000 mg | ORAL_TABLET | Freq: Two times a day (BID) | ORAL | 3 refills | Status: DC
Start: 2017-02-08 — End: 2018-02-12

## 2017-02-08 NOTE — Telephone Encounter (Signed)
Called and spoke with patient in regards to Pulmonary Rehab - Patient stated he is doing a lot better per his follow up visit yesterday. He stated that because he is doing so well he doesn't feel the need for Rehab. Closed Referral.

## 2017-02-08 NOTE — Telephone Encounter (Signed)
Patient stated that he was supposed to have four rx's sent in yesterday at his office visit however his pharmacy only received three. He stated that he needs the torsemide which was on class "no print". He is aware that I will resend this for him. Patient verbalized understanding and appreciation.

## 2017-02-09 ENCOUNTER — Encounter: Payer: Self-pay | Admitting: Cardiology

## 2017-02-09 NOTE — Progress Notes (Signed)
Remote pacemaker transmission.   

## 2017-02-09 NOTE — Progress Notes (Signed)
Letter  

## 2017-02-21 ENCOUNTER — Ambulatory Visit (INDEPENDENT_AMBULATORY_CARE_PROVIDER_SITE_OTHER): Payer: Medicare Other

## 2017-02-21 DIAGNOSIS — G459 Transient cerebral ischemic attack, unspecified: Secondary | ICD-10-CM | POA: Diagnosis not present

## 2017-02-21 DIAGNOSIS — Z9889 Other specified postprocedural states: Secondary | ICD-10-CM | POA: Diagnosis not present

## 2017-02-21 DIAGNOSIS — Z5181 Encounter for therapeutic drug level monitoring: Secondary | ICD-10-CM | POA: Diagnosis not present

## 2017-02-21 DIAGNOSIS — I4891 Unspecified atrial fibrillation: Secondary | ICD-10-CM | POA: Diagnosis not present

## 2017-02-21 LAB — POCT INR: INR: 2.3

## 2017-02-21 NOTE — Patient Instructions (Signed)
Take an extra 1/2 tablet today, then start taking 1 tablet everyday except 1.5 tablets on Sundays and Wednesdays.  Recheck INR in 2 weeks. [phone]

## 2017-03-07 ENCOUNTER — Ambulatory Visit (INDEPENDENT_AMBULATORY_CARE_PROVIDER_SITE_OTHER): Payer: Medicare Other | Admitting: Pharmacist

## 2017-03-07 DIAGNOSIS — G459 Transient cerebral ischemic attack, unspecified: Secondary | ICD-10-CM

## 2017-03-07 DIAGNOSIS — I4891 Unspecified atrial fibrillation: Secondary | ICD-10-CM

## 2017-03-07 DIAGNOSIS — Z5181 Encounter for therapeutic drug level monitoring: Secondary | ICD-10-CM

## 2017-03-07 DIAGNOSIS — Z9889 Other specified postprocedural states: Secondary | ICD-10-CM

## 2017-03-07 LAB — CUP PACEART REMOTE DEVICE CHECK
Battery Voltage: 2.65 V
Brady Statistic AP VP Percent: 88 %
Brady Statistic AS VP Percent: 9.5 %
Brady Statistic RA Percent Paced: 88 %
Brady Statistic RV Percent Paced: 98 %
Implantable Lead Implant Date: 20100414
Implantable Lead Location: 753860
Implantable Pulse Generator Implant Date: 20100414
Lead Channel Impedance Value: 540 Ohm
Lead Channel Pacing Threshold Amplitude: 0.75 V
Lead Channel Pacing Threshold Pulse Width: 0.4 ms
Lead Channel Setting Sensing Sensitivity: 2 mV
MDC IDC LEAD IMPLANT DT: 20100414
MDC IDC LEAD LOCATION: 753859
MDC IDC MSMT BATTERY REMAINING LONGEVITY: 4 mo
MDC IDC MSMT BATTERY REMAINING PERCENTAGE: 3 %
MDC IDC MSMT LEADCHNL RA IMPEDANCE VALUE: 390 Ohm
MDC IDC MSMT LEADCHNL RA SENSING INTR AMPL: 1 mV
MDC IDC MSMT LEADCHNL RV PACING THRESHOLD AMPLITUDE: 0.75 V
MDC IDC MSMT LEADCHNL RV PACING THRESHOLD PULSEWIDTH: 0.4 ms
MDC IDC MSMT LEADCHNL RV SENSING INTR AMPL: 6.3 mV
MDC IDC PG SERIAL: 2272351
MDC IDC SESS DTM: 20181101072558
MDC IDC SET LEADCHNL RA PACING AMPLITUDE: 2 V
MDC IDC SET LEADCHNL RV PACING AMPLITUDE: 2.5 V
MDC IDC SET LEADCHNL RV PACING PULSEWIDTH: 0.4 ms
MDC IDC STAT BRADY AP VS PERCENT: 1 %
MDC IDC STAT BRADY AS VS PERCENT: 1 %

## 2017-03-07 LAB — POCT INR: INR: 2.9

## 2017-03-07 NOTE — Patient Instructions (Signed)
Continue taking 1 tablet everyday except 1.5 tablets on Sundays and Wednesdays.  Recheck INR in 3 weeks. [phone]

## 2017-03-26 ENCOUNTER — Encounter: Payer: Self-pay | Admitting: Family Medicine

## 2017-03-26 ENCOUNTER — Ambulatory Visit (INDEPENDENT_AMBULATORY_CARE_PROVIDER_SITE_OTHER): Payer: Medicare Other | Admitting: Family Medicine

## 2017-03-26 VITALS — BP 120/60 | HR 67 | Temp 98.4°F | Wt 333.6 lb

## 2017-03-26 DIAGNOSIS — R1903 Right lower quadrant abdominal swelling, mass and lump: Secondary | ICD-10-CM | POA: Diagnosis not present

## 2017-03-26 DIAGNOSIS — Z7901 Long term (current) use of anticoagulants: Secondary | ICD-10-CM

## 2017-03-26 DIAGNOSIS — R58 Hemorrhage, not elsewhere classified: Secondary | ICD-10-CM

## 2017-03-26 NOTE — Progress Notes (Signed)
Subjective:    Patient ID: Frank [name], male    DOB: 1945/07/22, 72 y.o.   MRN: [id number]  No chief complaint on file.   HPI Patient was seen today for acute concern.  Pt endorses 2 areas of bruising, one on his leg and the other on his abdomen.  Pt denies any falls, trauma, injections, or recent changes to his coumadin dose.  Coumadin 2.9 on 11/28 (range 2-3).  Pt thought an insect bit him on his leg one night causing the area of bruising.  The area seems to now be resolving.  The area on his abdomen also has a small bump above it.   Pt has plans to leave Friday to travel to [place] an [place] for the holidays. Past Medical History:  Diagnosis Date  . Anxiety   . Atrial arrhythmia    status post ablation in [place] with complication including damage to the aortic valve  . Atrial fibrillation ([place])   . AV block, 1st degree   . Bradycardia   . Diastolic CHF, chronic ([place])   . Diverticulosis   . History of melanoma   . HTN (hypertension)   . Hypertrophic cardiomyopathy ([place])    s/p myoectomy  . Internal hemorrhoids   . mitral valve replacement-St. Jude's mechanical   . Morbid obesity ([place])   . Pacemaker  -SJM   . S/P aortic valve repair   . TIA (transient ischemic attack) 2001  . Tubulovillous adenoma     Allergies  Allergen Reactions  . Warfarin And Related Other (See Comments)    Per the patient's wife, "Warfain made him have a TIA" CAN TAKE BRAND NAME COUMADIN     ROS General: Denies fever, chills, night sweats, changes in weight, changes in appetite HEENT: Denies headaches, ear pain, changes in vision, rhinorrhea, sore throat CV: Denies CP, palpitations, SOB, orthopnea Pulm: Denies SOB, cough, wheezing GI: Denies abdominal pain, nausea, vomiting, diarrhea, constipation GU: Denies dysuria, hematuria, frequency, vaginal discharge Msk: Denies muscle cramps, joint pains Neuro: Denies weakness, numbness, tingling Skin: Denies rashes      +bruising  & bump Psych: Denies depression, anxiety, hallucinations     Objective:    Blood pressure 120/60, pulse 67, temperature 98.4 F (36.9 C), temperature source Oral, weight (!) 333 lb 9.6 oz (151.3 kg).   Gen. Pleasant, well-nourished, in no distress, normal affect   Lungs: no accessory muscle use, CTAB, no wheezes or rales Cardiovascular: RRR, no m/r/g, no peripheral edema Abdomen: BS present, soft, NT/ND, no hepatosplenomegaly.  Large area of ecchymosis on right lower quadrant. Neuro:  A&Ox3, CN II-XII intact, normal gait Skin:  Warm, dry, and intact.  Patient with large area of ecchymosis on right lower quadrant of the abdomen.  There is a small round approximately 1 cm, nontender, mobile, firm, cystic-like structure superior to the area of ecchymosis.  Posterior left leg with area of healing ecchymosis, barely visible, nontender.   Wt Readings from Last 3 Encounters:  03/26/17 (!) 333 lb 9.6 oz (151.3 kg)  02/07/17 (!) 339 lb 6.4 oz (154 kg)  01/30/17 (!) 350 lb 8 oz (159 kg)    Lab Results  Component Value Date   WBC 8.8 01/18/2017   HGB 12.4 (L) 01/18/2017   HCT 39.7 01/18/2017   PLT 233 01/18/2017   GLUCOSE 91 02/07/2017   CHOL 176 07/28/2015   TRIG 143 07/28/2015   HDL 34 (L) 07/28/2015   LDLCALC 113 07/28/2015   ALT 15 (L)  01/18/2017   AST 30 01/18/2017   NA 139 02/07/2017   K 4.3 02/07/2017   CL 95 (L) 02/07/2017   CREATININE 1.02 02/07/2017   BUN 26 02/07/2017   CO2 26 02/07/2017   TSH 3.296 01/18/2017   INR 2.9 03/07/2017    Assessment/Plan:  Right lower quadrant abdominal mass  -Discussed possible causes including lymphadenopathy, cyst, abscess. -We will evaluate further with ultrasound - Plan: US Abdomen Complete  Ecchymosis -Likely secondary to chronic Coumadin use -Discussed ways to prevent -Given handout -Patient to have INR rechecked on Wednesday 03/28/17  Warfarin anticoagulation -On long-term status post mechanical mitral valve, aortic  valve repair, Afib, TIA -Continue current dosing 15 mg every Sunday, Wednesday 10 mg all other days.  Weekly total 80 mg -INR goal 2.5-3.5 -INR on 11/28 was 2.9.   F/u prn.

## 2017-03-26 NOTE — Patient Instructions (Addendum)
Bleeding Precautions When on Anticoagulant Therapy  WHAT IS ANTICOAGULANT THERAPY?  Anticoagulant therapy is taking medicine to prevent or reduce blood clots. It is also called blood thinner therapy. Blood clots that form in your blood vessels can be dangerous. They can break loose and travel to your heart, lungs, or brain. This increases your risk of a heart attack or stroke. Anticoagulant therapy causes blood to clot more slowly.  You may need anticoagulant therapy if you have:   A medical condition that increases the likelihood that blood clots will form.   A heart defect or a problem with heart rhythm.  It is also a common treatment after heart surgery, such as valve replacement.  WHAT ARE COMMON TYPES OF ANTICOAGULANT THERAPY?  Anticoagulant medicine can be injected or taken by mouth.If you need anticoagulant therapy quickly at the hospital, the medicine may be injected under your skin or given through an IV tube. Heparin is a common example of an anticoagulant that you may get at the hospital.  Most anticoagulant therapy is in the form of pills that you take at home every day. These may include:   Aspirin. This common blood thinner works by preventing blood cells (platelets) from sticking together to form a clot. Aspirin is not as strong as anticoagulants that slow down the time that it takes for your body to form a clot.   Clopidogrel. This is a newer type of drug that affects platelets. It is stronger than aspirin.   Warfarin. This is the most common anticoagulant. It changes the way your body uses vitamin K, a vitamin that helps your blood to clot. The risk of bleeding is higher with warfarin than with aspirin. You will need frequent blood tests to make sure you are taking the safest amount.   New anticoagulants. Several new drugs have been approved. They are all taken by mouth. Studies show that these drugs work as well as warfarin. They do not require blood testing. They may cause less bleeding  risk than warfarin.  WHAT DO I NEED TO REMEMBER WHEN TAKING ANTICOAGULANT THERAPY?  Anticoagulant therapy decreases your risk of forming a blood clot, but it increases your risk of bleeding. Work closely with your health care provider to make sure you are taking your medicine safely. These tips can help:   Learn ways to reduce your risk of bleeding.   If you are taking warfarin:  ? Have blood tests as ordered by your health care provider.  ? Do not make any sudden changes to your diet. Vitamin K in your diet can make warfarin less effective.  ? Do not get pregnant. This medicine may cause birth defects.   Take your medicine at the same time every day. If you forget to take your medicine, take it as soon as you remember. If you miss a whole day, do not double your dose of medicine. Take your normal dose and call your health care provider to check in.   Do not stop taking your medicine on your own.   Tell your health care provider before you start taking any new medicine, vitamin, or herbal product. Some of these could interfere with your therapy.   Tell all of your health care providers that you are on anticoagulant therapy.   Do not have surgery, medical procedures, or dental work until you tell your health care provider that you are on anticoagulant therapy.  WHAT CAN AFFECT HOW ANTICOAGULANTS WORK?  Certain foods, vitamins, medicines, supplements, and herbal   medicines change the way that anticoagulant therapy works. They may increase or decrease the effects of your anticoagulant therapy. Either result can be dangerous for you.   Many over-the-counter medicines for pain, colds, or stomach problems interfere with anticoagulant therapy. Take these only as told by your health care provider.   Do not drink alcohol. It can interfere with your medicine and increase your risk of an injury that causes bleeding.   If you are taking warfarin, do not begin eating more foods that contain vitamin K. These include  leafy green vegetables. Ask your health care provider if you should avoid any foods.  WHAT ARE SOME WAYS TO PREVENT BLEEDING?  You can prevent bleeding by taking certain precautions:   Be extra careful when you use knives, scissors, or other sharp objects.   Use an electric razor instead of a blade.   Do not use toothpicks.   Use a soft toothbrush.   Wear shoes that have nonskid soles.   Use bath mats and handrails in your bathroom.   Wear gloves while you do yard work.   Wear a helmet when you ride a bike.   Wear your seat belt.   Prevent falls by removing loose rugs and extension cords from areas where you walk.   Do not play contact sports or participate in other activities that have a high risk of injury.  WHEN SHOULD I CONTACT MY HEALTH CARE PROVIDER?  Call your health care provider if:   You miss a dose of medicine:  ? And you are not sure what to do.  ? For more than one day.   You have:  ? Menstrual bleeding that is heavier than normal.  ? Blood in your urine.  ? A bloody nose or bleeding gums.  ? Easy bruising.  ? Blood in your stool (feces) or have black and tarry stool.  ? Side effects from your medicine.   You feel weak or dizzy.   You become pregnant.  Seek immediate medical care if:   You have bleeding that will not stop.   You have sudden and severe headache or belly pain.   You vomit or you cough up bright red blood.   You have a severe blow to your head.  WHAT ARE SOME QUESTIONS TO ASK MY HEALTH CARE PROVIDER?   What is the best anticoagulant therapy for my condition?   What side effects should I watch for?   When should I take my medicine? What should I do if I forget to take it?   Will I need to have regular blood tests?   Do I need to change my diet? Are there foods or drinks that I should avoid?   What activities are safe for me?   What should I do if I want to get pregnant?  This information is not intended to replace advice given to you by your health care provider.  Make sure you discuss any questions you have with your health care provider.  Document Released: 03/08/2015 Document Reviewed: 03/08/2015  Elsevier Interactive Patient Education  2017 [employer]

## 2017-03-27 ENCOUNTER — Other Ambulatory Visit: Payer: Self-pay | Admitting: Emergency Medicine

## 2017-03-27 DIAGNOSIS — R1903 Right lower quadrant abdominal swelling, mass and lump: Secondary | ICD-10-CM

## 2017-03-28 ENCOUNTER — Ambulatory Visit (INDEPENDENT_AMBULATORY_CARE_PROVIDER_SITE_OTHER): Payer: Medicare Other | Admitting: Pharmacist

## 2017-03-28 ENCOUNTER — Ambulatory Visit
Admission: RE | Admit: 2017-03-28 | Discharge: 2017-03-28 | Disposition: A | Discharge status: Home / Self Care | Payer: Medicare Other | Source: Ambulatory Visit | Attending: Family Medicine | Admitting: Family Medicine

## 2017-03-28 DIAGNOSIS — Z5181 Encounter for therapeutic drug level monitoring: Secondary | ICD-10-CM | POA: Diagnosis not present

## 2017-03-28 DIAGNOSIS — R1903 Right lower quadrant abdominal swelling, mass and lump: Secondary | ICD-10-CM

## 2017-03-28 DIAGNOSIS — Z9889 Other specified postprocedural states: Secondary | ICD-10-CM | POA: Diagnosis not present

## 2017-03-28 DIAGNOSIS — G459 Transient cerebral ischemic attack, unspecified: Secondary | ICD-10-CM | POA: Diagnosis not present

## 2017-03-28 DIAGNOSIS — I4891 Unspecified atrial fibrillation: Secondary | ICD-10-CM | POA: Diagnosis not present

## 2017-03-28 DIAGNOSIS — S301XXA Contusion of abdominal wall, initial encounter: Secondary | ICD-10-CM | POA: Diagnosis not present

## 2017-03-28 LAB — POCT INR: INR: 2.7

## 2017-03-28 NOTE — Patient Instructions (Signed)
Description   Continue taking 1 tablet everyday except 1.5 tablets on Sundays and Wednesdays.  Recheck INR in 4 weeks. [phone]

## 2017-03-29 ENCOUNTER — Telehealth: Payer: Self-pay | Admitting: Family Medicine

## 2017-03-29 NOTE — Telephone Encounter (Signed)
Copied from [place] [id number]. Topic: Quick Communication - Other Results >> Mar 29, 2017 10:28 AM Synthia Innocent wrote: Requesting [name] results, had done yesterday.

## 2017-03-29 NOTE — Telephone Encounter (Signed)
Patient was called and made aware of results. Nothing further needed.

## 2017-04-18 ENCOUNTER — Encounter: Payer: Self-pay | Admitting: Internal Medicine

## 2017-04-18 ENCOUNTER — Ambulatory Visit (INDEPENDENT_AMBULATORY_CARE_PROVIDER_SITE_OTHER): Payer: Medicare Other

## 2017-04-18 ENCOUNTER — Ambulatory Visit (INDEPENDENT_AMBULATORY_CARE_PROVIDER_SITE_OTHER): Payer: Medicare Other | Admitting: Internal Medicine

## 2017-04-18 VITALS — BP 116/60 | HR 61 | Ht 71.0 in | Wt 325.0 lb

## 2017-04-18 DIAGNOSIS — Z79899 Other long term (current) drug therapy: Secondary | ICD-10-CM | POA: Diagnosis not present

## 2017-04-18 DIAGNOSIS — I4891 Unspecified atrial fibrillation: Secondary | ICD-10-CM | POA: Diagnosis not present

## 2017-04-18 DIAGNOSIS — Z9889 Other specified postprocedural states: Secondary | ICD-10-CM

## 2017-04-18 DIAGNOSIS — I480 Paroxysmal atrial fibrillation: Secondary | ICD-10-CM | POA: Diagnosis not present

## 2017-04-18 DIAGNOSIS — I421 Obstructive hypertrophic cardiomyopathy: Secondary | ICD-10-CM

## 2017-04-18 DIAGNOSIS — Z5181 Encounter for therapeutic drug level monitoring: Secondary | ICD-10-CM

## 2017-04-18 DIAGNOSIS — I5042 Chronic combined systolic (congestive) and diastolic (congestive) heart failure: Secondary | ICD-10-CM

## 2017-04-18 DIAGNOSIS — G459 Transient cerebral ischemic attack, unspecified: Secondary | ICD-10-CM

## 2017-04-18 LAB — BASIC METABOLIC PANEL
BUN/Creatinine Ratio: 20 (ref 10–24)
BUN: 23 mg/dL (ref 8–27)
CALCIUM: 9.4 mg/dL (ref 8.6–10.2)
CHLORIDE: 96 mmol/L (ref 96–106)
CO2: 26 mmol/L (ref 20–29)
Creatinine, Ser: 1.16 mg/dL (ref 0.76–1.27)
GFR calc non Af Amer: 64 mL/min/{1.73_m2} (ref >59)
GFR, EST AFRICAN AMERICAN: 74 mL/min/{1.73_m2} (ref >59)
GLUCOSE: 99 mg/dL (ref 65–99)
Potassium: 4.3 mmol/L (ref 3.5–5.2)
Sodium: 137 mmol/L (ref 134–144)

## 2017-04-18 LAB — POCT INR: INR: 2.8

## 2017-04-18 NOTE — Patient Instructions (Signed)
Description   Continue taking 1 tablet everyday except 1.5 tablets on Sundays and Wednesdays.  Recheck INR in 6 weeks. [phone]

## 2017-04-18 NOTE — Patient Instructions (Addendum)
Medication Instructions:  Your physician recommends that you continue on your current medications as directed. Please refer to the Current Medication list given to you today.  * If you need a refill on your cardiac medications before your next appointment, please call your pharmacy. *  Labwork: BMET today  Testing/Procedures: CRT or cardiac resynchronization therapy is a treatment used to correct heart failure. When you have heart failure your heart is weakened and doesn't pump as well as it should. This therapy may help reduce symptoms and improve the quality of life.  Please see the handout/brochure given to you today to get more information of the different options of therapy.  The office will call you to arrange this procedure.  The following dates are available (these are subject to change): 2/4, 2/15, 2/18, 3/1   Follow-Up: To be determined once procedure date is scheduled.  Thank you for choosing [facility]!!   Any Other Special Instructions Will Be Listed Below (If Applicable).

## 2017-04-18 NOTE — Progress Notes (Signed)
Patient Care Team: [name], MD as PCP - General (Family Medicine)   HPI  Frank [name] is a 72 y.o. male Seen in followup for hypertrophic cardiomyopathy,  status post myectomy with mechanical mitral valve replacement and aortic valve repair. 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 His device has reached ERI  He has noise on his RV lead  Years ago he  saw Dr. Reine Just who recommended nocturnal oxygen and a right left heart catheter. The patient deferred on both ; with a change in diet he felt considerably better for a period of time.  He has not had a sleep study  DATE TEST    4/17    echo   EF 55-60 % Severe LVH (18/17). [employer]  PA press 62 RV normal  10/18 Echo  EF 45-50% Some segmental WMA Severe RVE, depressed EF LAE (63/2.1/33)     CPX test done about 2010 demonstrated a VO2 max of 12.2 representing 66.4% predicted. his VO2 adjusted for body weight was 25.9 His slope was 34 and his O2 pulse was 17, 88% of predicted.   Date Cr K Hgb  10/18 1.02  12.4            Admitted 10/18 with congestive heart failure-- weight at that time 377 Since that time he is been feeling much better with an interval 55 pound weight loss.  He continues to volume overloaded.  He is short of breath with exertion.  Still has significant fluid  He notes that he is averse to the idea of oxygen therapy.     Past Medical History:  Diagnosis Date  . Anxiety   . Atrial arrhythmia    status post ablation in [place] with complication including damage to the aortic valve  . Atrial fibrillation ([place])   . AV block, 1st degree   . Bradycardia   . Diastolic CHF, chronic ([place])   . Diverticulosis   . History of melanoma   . HTN (hypertension)   . Hypertrophic cardiomyopathy ([place])    s/p myoectomy  . Internal hemorrhoids   . mitral valve replacement-St. Jude's mechanical   . Morbid obesity ([place])   . Pacemaker  -SJM   . S/P aortic valve  repair   . TIA (transient ischemic attack) 2001  . Tubulovillous adenoma     Past Surgical History:  Procedure Laterality Date  . AORTIC VALVE REPLACEMENT     mechanical-following traumatic injury during an ablation procedure  . COLONOSCOPY    . COLONOSCOPY WITH PROPOFOL N/A 04/13/2016   Procedure: COLONOSCOPY WITH PROPOFOL;  Surgeon: Gatha Mayer, MD;  Location: [facility];  Service: Endoscopy;  Laterality: N/A;  . MITRAL VALVE REPLACEMENT     mechanical  . PACEMAKER INSERTION     St Jude Accent   . septal myectomy      Current Outpatient Medications  Medication Sig Dispense Refill  . Ascorbic Acid (VITAMIN C) 1000 MG tablet Take 2,000 mg by mouth daily.      Marland Kitchen aspirin 81 MG tablet Take 81 mg by mouth daily.    Marland Kitchen COUMADIN 10 MG tablet TAKE AS DIRECTED BY COUMADIN CLINIC (Patient not taking: Reported on 04/18/2017) 100 tablet 1  . fish oil-omega-3 fatty acids 1000 MG capsule Take 4 g by mouth every morning.     Marland Kitchen losartan (COZAAR) 100 MG tablet Take 1 tablet (100 mg total) by mouth daily. (Patient not taking: Reported  on 04/18/2017) 90 tablet 3  . Magnesium Oxide 250 MG TABS Take 1 tablet (250 mg total) by mouth 3 (three) times daily. (Patient not taking: Reported on 04/18/2017) 270 tablet 3  . Multiple Vitamin (MULTIVITAMIN) tablet Take 1 tablet by mouth daily.      . potassium chloride (K-DUR) 10 MEQ tablet Take 11 tablets (110 mEq total) by mouth daily. TAKE 11 TABLETS BY MOUTH ONCE DAILY (Patient not taking: Reported on 04/18/2017) 990 tablet 3  . spironolactone (ALDACTONE) 25 MG tablet Take 1 tablet (25 mg total) by mouth daily. (Patient not taking: Reported on 04/18/2017) 90 tablet 3  . torsemide (DEMADEX) 20 MG tablet Take 2 tablets (40 mg total) by mouth 2 (two) times daily. (Patient not taking: Reported on 04/18/2017) 360 tablet 3   No current facility-administered medications for this visit.     Allergies  Allergen Reactions  . Warfarin And Related Other (See Comments)    Per  the patient's wife, "Warfain made him have a TIA" CAN TAKE BRAND NAME COUMADIN     Review of Systems negative except from HPI and PMH  Physical Exam BP 116/60   Pulse 61   Ht 5\' 11"  (1.803 m)   Wt (!) 325 lb (147.4 kg)   BMI 45.33 kg/m  Morbidly obese  in no acute distress HENT normal Neck supple with JVP-10 Clear Device pocket well healed; without hematoma or erythema.  There is no tethering  Regular rate and rhythm, no murmurs or gallops Abd-soft with active BS No Clubbing cyanosis 3+ edema Skin-warm and dry A & Oriented  Grossly normal sensory and motor function   ECG not done  P-synchronous/ AV  pacing   Assessment and  Plan  Atrial fibrillation-paroxysmal  Mitral valve replacement-mechanical  Hypertrophic cardiomyopathy status post myectomy  HFpEF  Mobitz 1 second degree heart block  Sinus node dysfunction  Mechanical lead failure-noise reversion   A and V   Pacemaker-St. Jude  VT  Nonsustained   Hypoxemia  Right heart failure-new  Left ventricular dysfunction-new  He is much improved following his hospitalization with an interval 55 pound weight loss. He is also lost another 15 pounds in the last 2 months following discharge.  He remains volume overloaded.  We reviewed the findings on his echocardiogram as below.  His LV function is deteriorating.  One possible explanation is pacemaker induced cardiomyopathy.  Device generator replacement we will plan upgrade either by His bundle pacing or at resynchronization.  With his RV lead had a noisy largely device dependent we will plan to replace his RV lead as well.  I am concerned also about deteriorating RV function.  He has repeatedly been identified as having hypoxemia.  He has an O2 sat probe at home that can measure serially and download.  We will measure saturations at night as well as with exertion.  He may well need right heart catheterization.  We have also discussed referrals to pulmonary and/or  heart failure clinic.   More than 50% of 40   was spent in counseling related to the above

## 2017-04-20 ENCOUNTER — Telehealth: Payer: Self-pay | Admitting: Internal Medicine

## 2017-04-20 NOTE — Telephone Encounter (Signed)
Patient advised to use the Cepacol throat spray that he just purchased and f/u with his PCP regarding his sore throat. Patient verbalized understanding.

## 2017-04-20 NOTE — Telephone Encounter (Signed)
Mr. Mcquerry is calling to find out what Over the counter medication he can take for a severe sore throat . Please call

## 2017-04-24 ENCOUNTER — Telehealth: Payer: Self-pay | Admitting: Internal Medicine

## 2017-04-24 NOTE — Telephone Encounter (Signed)
New message  Pt verbalized that he is calling for RN  Pt want his lab results

## 2017-04-24 NOTE — Telephone Encounter (Signed)
Patient made aware of results. Patient verbalizes understanding.   Notes recorded by [name], MD on 04/18/2017 at 6:27 PM EST Please Inform Patient that labs are normal x  Thanks

## 2017-04-27 ENCOUNTER — Telehealth: Payer: Self-pay

## 2017-04-27 NOTE — Telephone Encounter (Signed)
Spoke with Frank [name] regarding his BiV upgrade scheduled for 2/18. Pt plans on coming in to the office on 1/28 to drop off some SpO2 records he has been documenting for Dr [name]. At that time, Pt will pick up his CHG scrub and procedure instructions.  I spoke with Frank [name] regarding medications to hold before his procedure; Will follow up with Dr [name] when he returns to the office regarding his Coumadin and potassium supplements.   At this time, pt did not have any additional questions regarding his procedure

## 2017-05-10 ENCOUNTER — Ambulatory Visit (INDEPENDENT_AMBULATORY_CARE_PROVIDER_SITE_OTHER): Payer: Medicare Other | Admitting: *Deleted

## 2017-05-10 DIAGNOSIS — I441 Atrioventricular block, second degree: Secondary | ICD-10-CM

## 2017-05-10 NOTE — Progress Notes (Signed)
Remote pacemaker transmission.   

## 2017-05-11 ENCOUNTER — Encounter: Payer: Self-pay | Admitting: Cardiology

## 2017-05-11 ENCOUNTER — Encounter: Payer: Self-pay | Admitting: Internal Medicine

## 2017-05-15 ENCOUNTER — Encounter: Payer: Self-pay | Admitting: Internal Medicine

## 2017-05-22 ENCOUNTER — Telehealth: Payer: Self-pay

## 2017-05-22 DIAGNOSIS — I482 Chronic atrial fibrillation, unspecified: Secondary | ICD-10-CM

## 2017-05-22 DIAGNOSIS — Z01812 Encounter for preprocedural laboratory examination: Secondary | ICD-10-CM

## 2017-05-22 DIAGNOSIS — Z79899 Other long term (current) drug therapy: Secondary | ICD-10-CM

## 2017-05-22 MED ORDER — SPIRONOLACTONE 25 MG PO TABS: 25.0000 mg | ORAL_TABLET | Freq: Every day | ORAL | 0 refills | Status: DC

## 2017-05-22 MED ORDER — LOSARTAN POTASSIUM 100 MG PO TABS: 100.0000 mg | ORAL_TABLET | Freq: Every day | ORAL | 0 refills | Status: DC

## 2017-05-22 NOTE — Addendum Note (Signed)
Addended by: Dollene Primrose on: 05/22/2017 05:49 PM   Modules accepted: Orders

## 2017-05-22 NOTE — Telephone Encounter (Signed)
Called patient and asked him to come in for a lab draw on 2/13 for his upcoming procedure on 2/18 (CBC, BMP, PT/INR). There was also some discussion regarding his CHG soap. I instructed him to pick it up from the front desk when he comes in for his labs. I am also leaving a paper prescription for 100mg  losartan 30 day and 25mg  Spironolactone 30 day at the front desk per patient request.

## 2017-05-23 ENCOUNTER — Other Ambulatory Visit: Payer: Medicare Other | Admitting: *Deleted

## 2017-05-23 DIAGNOSIS — Z79899 Other long term (current) drug therapy: Secondary | ICD-10-CM | POA: Diagnosis not present

## 2017-05-23 DIAGNOSIS — I482 Chronic atrial fibrillation, unspecified: Secondary | ICD-10-CM

## 2017-05-23 DIAGNOSIS — Z01812 Encounter for preprocedural laboratory examination: Secondary | ICD-10-CM

## 2017-05-24 LAB — CBC WITH DIFFERENTIAL/PLATELET
BASOS: 1 %
Basophils Absolute: 0 10*3/uL (ref 0.0–0.2)
EOS (ABSOLUTE): 0.2 10*3/uL (ref 0.0–0.4)
EOS: 3 %
HEMATOCRIT: 37.8 % (ref 37.5–51.0)
HEMOGLOBIN: 13.3 g/dL (ref 13.0–17.7)
Immature Grans (Abs): 0 10*3/uL (ref 0.0–0.1)
Immature Granulocytes: 0 %
LYMPHS ABS: 1.4 10*3/uL (ref 0.7–3.1)
Lymphs: 17 %
MCH: 33.8 pg — ABNORMAL HIGH (ref 26.6–33.0)
MCHC: 35.2 g/dL (ref 31.5–35.7)
MCV: 96 fL (ref 79–97)
MONOCYTES: 11 %
MONOS ABS: 0.9 10*3/uL (ref 0.1–0.9)
NEUTROS ABS: 5.4 10*3/uL (ref 1.4–7.0)
Neutrophils: 68 %
Platelets: 239 10*3/uL (ref 150–379)
RBC: 3.93 x10E6/uL — ABNORMAL LOW (ref 4.14–5.80)
RDW: 14.6 % (ref 12.3–15.4)
WBC: 7.8 10*3/uL (ref 3.4–10.8)

## 2017-05-24 LAB — PROTIME-INR
INR: 2.5 — AB (ref 0.8–1.2)
Prothrombin Time: 25.1 s — ABNORMAL HIGH (ref 9.1–12.0)

## 2017-05-24 LAB — BASIC METABOLIC PANEL
BUN/Creatinine Ratio: 17 (ref 10–24)
BUN: 19 mg/dL (ref 8–27)
CALCIUM: 9.7 mg/dL (ref 8.6–10.2)
CO2: 23 mmol/L (ref 20–29)
Chloride: 100 mmol/L (ref 96–106)
Creatinine, Ser: 1.12 mg/dL (ref 0.76–1.27)
GFR, EST AFRICAN AMERICAN: 77 mL/min/{1.73_m2} (ref >59)
GFR, EST NON AFRICAN AMERICAN: 67 mL/min/{1.73_m2} (ref >59)
Glucose: 113 mg/dL — ABNORMAL HIGH (ref 65–99)
POTASSIUM: 3.9 mmol/L (ref 3.5–5.2)
Sodium: 140 mmol/L (ref 134–144)

## 2017-05-25 ENCOUNTER — Telehealth: Payer: Self-pay | Admitting: Internal Medicine

## 2017-05-25 ENCOUNTER — Encounter: Payer: Self-pay | Admitting: Internal Medicine

## 2017-05-25 NOTE — Telephone Encounter (Signed)
New message ° °Pt verbalized that he is returning call for RN °

## 2017-05-27 DIAGNOSIS — Z950 Presence of cardiac pacemaker: Secondary | ICD-10-CM

## 2017-05-27 DIAGNOSIS — I472 Ventricular tachycardia: Secondary | ICD-10-CM

## 2017-05-27 DIAGNOSIS — I5042 Chronic combined systolic (congestive) and diastolic (congestive) heart failure: Secondary | ICD-10-CM

## 2017-05-27 DIAGNOSIS — I442 Atrioventricular block, complete: Secondary | ICD-10-CM

## 2017-05-27 LAB — CUP PACEART REMOTE DEVICE CHECK
Battery Remaining Longevity: 0 mo
Battery Voltage: 2.59 V
Brady Statistic AS VS Percent: 1 %
Brady Statistic RV Percent Paced: 98 %
Date Time Interrogation Session: 20190130075920
Implantable Lead Implant Date: 20100414
Implantable Lead Location: 753860
Implantable Pulse Generator Implant Date: 20100414
Lead Channel Impedance Value: 390 Ohm
Lead Channel Pacing Threshold Amplitude: 0.75 V
Lead Channel Pacing Threshold Pulse Width: 0.4 ms
Lead Channel Sensing Intrinsic Amplitude: 2 mV
Lead Channel Sensing Intrinsic Amplitude: 6 mV
Lead Channel Setting Pacing Amplitude: 2 V
MDC IDC LEAD IMPLANT DT: 20100414
MDC IDC LEAD LOCATION: 753859
MDC IDC MSMT LEADCHNL RA PACING THRESHOLD AMPLITUDE: 0.75 V
MDC IDC MSMT LEADCHNL RV IMPEDANCE VALUE: 540 Ohm
MDC IDC MSMT LEADCHNL RV PACING THRESHOLD PULSEWIDTH: 0.4 ms
MDC IDC SET LEADCHNL RV PACING AMPLITUDE: 2.5 V
MDC IDC SET LEADCHNL RV PACING PULSEWIDTH: 0.4 ms
MDC IDC SET LEADCHNL RV SENSING SENSITIVITY: 2 mV
MDC IDC STAT BRADY AP VP PERCENT: 88 %
MDC IDC STAT BRADY AP VS PERCENT: 1 %
MDC IDC STAT BRADY AS VP PERCENT: 10 %
MDC IDC STAT BRADY RA PERCENT PACED: 88 %
Pulse Gen Model: 2210
Pulse Gen Serial Number: 2272351

## 2017-05-27 NOTE — H&P (Signed)
Patient Care Team: [name], MD as PCP - General (Family Medicine) [name], MD as Consulting Physician (Cardiology)   HPI  Frank [name] is a 72 y.o. male With hx  hypertrophic cardiomyopathy,  status post myectomy with mechanical mitral valve replacement and aortic valve repair progressive conduction system disease and now complete heart block and a previously implanted pacemaker now at [facility].    He has paroxysmal atrial fibrillation although only insignificantly over the last year  There is been progressive LV dysfunction as well as RV dysfunction.  There is impending failure of his RV lead.  He is admitted for system revision with CRT P upgrade  DATE TEST    4/17    echo   EF 55-60 % Severe LVH (18/17). [employer]  PA press 62 RV normal  10/18 Echo  EF 45-50% Some segmental WMA Severe RVE, depressed EF LAE (63/2.1/33)     CPX test done about 2010 demonstrated a VO2 max of 12.2 representing 66.4% predicted. his VO2 adjusted for body weight was 25.9 His slope was 34 and his O2 pulse was 17, 88% of predicted.   Date Cr K Hgb  10/18 1.02  12.4   2/18  1.12 3.9 13.3    No change in functional status with ongoing DOE;  Edema stable     Past Medical History:  Diagnosis Date  . Anxiety   . Atrial arrhythmia    status post ablation in [place] with complication including damage to the aortic valve  . Atrial fibrillation ([place])   . AV block, 1st degree   . Bradycardia   . Diastolic CHF, chronic ([place])   . Diverticulosis   . History of melanoma   . HTN (hypertension)   . Hypertrophic cardiomyopathy ([place])    s/p myoectomy  . Internal hemorrhoids   . mitral valve replacement-St. Jude's mechanical   . Morbid obesity ([place])   . Pacemaker  -SJM   . S/P aortic valve repair   . TIA (transient ischemic attack) 2001  . Tubulovillous adenoma     Past Surgical History:  Procedure Laterality Date  . AORTIC VALVE REPLACEMENT     mechanical-following  traumatic injury during an ablation procedure  . COLONOSCOPY    . COLONOSCOPY WITH PROPOFOL N/A 04/13/2016   Procedure: COLONOSCOPY WITH PROPOFOL;  Surgeon: Gatha Mayer, MD;  Location: [facility];  Service: Endoscopy;  Laterality: N/A;  . MITRAL VALVE REPLACEMENT     mechanical  . PACEMAKER INSERTION     St Jude Accent   . septal myectomy      Current Facility-Administered Medications  Medication Dose Route Frequency Provider Last Rate Last Dose  . 0.9 %  sodium chloride infusion   Intravenous Continuous [name], MD      . 0.9 %  sodium chloride infusion   Intravenous Continuous [name], MD 50 mL/hr at 05/28/17 [phone]    . ceFAZolin (ANCEF) 3 g in dextrose 5 % 50 mL IVPB  3 g Intravenous On Call [name], MD      . chlorhexidine (HIBICLENS) 4 % liquid 4 application  60 mL Topical Once [name], MD      . gentamicin (GARAMYCIN) 80 mg in sodium chloride irrigation 0.9 % 500 mL irrigation  80 mg Irrigation On Call [name], MD        Allergies  Allergen Reactions  . Warfarin And Related Other (See Comments)  Per the patient's wife, "Warfain made him have a TIA" CAN TAKE BRAND NAME COUMADIN       Social History   Tobacco Use  . Smoking status: Former Smoker    Types: Cigarettes    Last attempt to quit: 04/10/1977    Years since quitting: 40.1  . Smokeless tobacco: Never Used  Substance Use Topics  . Alcohol use: Yes    Comment: occasional  . Drug use: No     Family History  Problem Relation Age of Onset  . Heart disease Sister   . Uterine cancer Sister   . Obesity Sister        500+ lbs  . Heart disease Brother   . Heart disease Sister   . Heart disease Father   . Colon cancer Father 71  . Crohn's disease Paternal Grandmother      Current Meds  Medication Sig  . Ascorbic Acid (VITAMIN C) 1000 MG tablet Take 2,000 mg by mouth daily.    Marland Kitchen aspirin 81 MG tablet Take 81 mg by mouth daily.  Marland Kitchen COUMADIN 10 MG tablet TAKE AS DIRECTED  BY COUMADIN CLINIC (Patient taking differently: Take 7.5 mg by mouth daily on Wednesday and Sunday. Take 5 mg by mouth daily on all other days)  . diphenhydramine-acetaminophen (TYLENOL PM) 25-500 MG TABS tablet Take 2 tablets by mouth at bedtime as needed (for sleep).  . fish oil-omega-3 fatty acids 1000 MG capsule Take 4 g by mouth every morning.   Marland Kitchen losartan (COZAAR) 100 MG tablet Take 1 tablet (100 mg total) by mouth daily.  . Magnesium Oxide 250 MG TABS Take 1 tablet (250 mg total) by mouth 3 (three) times daily.  . Multiple Vitamin (MULTIVITAMIN) tablet Take 1 tablet by mouth daily.    . potassium chloride (K-DUR) 10 MEQ tablet Take 11 tablets (110 mEq total) by mouth daily. TAKE 11 TABLETS BY MOUTH ONCE DAILY (Patient taking differently: Take 40-70 mEq by mouth See admin instructions. Take 70 meq by mouth in the morning and take 40 meq by mouth in the afternoon)  . spironolactone (ALDACTONE) 25 MG tablet Take 1 tablet (25 mg total) by mouth daily.  Marland Kitchen torsemide (DEMADEX) 20 MG tablet Take 2 tablets (40 mg total) by mouth 2 (two) times daily.     Review of Systems negative except from HPI and PMH  Physical Exam BP (!) 135/43 (BP Location: Right Arm)   Pulse (!) 56   Temp (!) 97.5 F (36.4 C) (Oral)   Ht 5\' 11"  (1.803 m)   Wt (!) 315 lb (142.9 kg)   SpO2 98%   BMI 43.93 kg/m  Well developed and Morbidly obese  in no acute distress HENT normal E scleral and icterus clear Neck Supple JVP unable to discern   Clear to ausculation  Regular rate and rhythm, no murmurs gallops or rub Soft with active bowel sounds No clubbing cyanosis 2+ Edema Alert and oriented, grossly normal motor and sensory function Skin Warm and Dry    Assessment and  Plan  Atrial fibrillation-paroxysmal  Mitral valve replacement-mechanical  Hypertrophic cardiomyopathy status post myectomy  HFpEF  Mobitz 1 second degree heart block>> now complete heart block  Sinus node  dysfunction  Mechanical lead failure-noise reversion   A and V   Pacemaker-St. Jude  VT  Nonsustained   Right heart failure-new  Left ventricular dysfunction-new   He has had progressive LV dysfunction.  He has issues of impending lead failure on his RV  lead and as he is device dependent we will plan is to replace his RV lead as well as attempt at resynchronization  w LV lead  The benefits and risks were reviewed including but not limited to death,  perforation, infection, lead dislodgement and device malfunction.  The patient understands agrees and is willing to proceed.

## 2017-05-28 ENCOUNTER — Ambulatory Visit (HOSPITAL_COMMUNITY)
Admission: RE | Disposition: A | Discharge status: Home / Self Care | Payer: Self-pay | Source: Ambulatory Visit | Attending: Internal Medicine

## 2017-05-28 ENCOUNTER — Other Ambulatory Visit: Payer: Self-pay

## 2017-05-28 ENCOUNTER — Ambulatory Visit (HOSPITAL_COMMUNITY)
Admission: RE | Admit: 2017-05-28 | Discharge: 2017-05-29 | Disposition: A | Discharge status: Home / Self Care | Payer: Medicare Other | Source: Ambulatory Visit | Attending: Internal Medicine | Admitting: Internal Medicine

## 2017-05-28 ENCOUNTER — Encounter (HOSPITAL_COMMUNITY): Payer: Self-pay | Admitting: General Practice

## 2017-05-28 DIAGNOSIS — I442 Atrioventricular block, complete: Secondary | ICD-10-CM | POA: Diagnosis not present

## 2017-05-28 DIAGNOSIS — Z7901 Long term (current) use of anticoagulants: Secondary | ICD-10-CM | POA: Diagnosis not present

## 2017-05-28 DIAGNOSIS — Z85820 Personal history of malignant melanoma of skin: Secondary | ICD-10-CM | POA: Insufficient documentation

## 2017-05-28 DIAGNOSIS — I495 Sick sinus syndrome: Secondary | ICD-10-CM | POA: Diagnosis not present

## 2017-05-28 DIAGNOSIS — I509 Heart failure, unspecified: Secondary | ICD-10-CM

## 2017-05-28 DIAGNOSIS — I480 Paroxysmal atrial fibrillation: Secondary | ICD-10-CM | POA: Insufficient documentation

## 2017-05-28 DIAGNOSIS — T82190A Other mechanical complication of cardiac electrode, initial encounter: Secondary | ICD-10-CM | POA: Diagnosis not present

## 2017-05-28 DIAGNOSIS — Z7982 Long term (current) use of aspirin: Secondary | ICD-10-CM | POA: Insufficient documentation

## 2017-05-28 DIAGNOSIS — Z8673 Personal history of transient ischemic attack (TIA), and cerebral infarction without residual deficits: Secondary | ICD-10-CM | POA: Insufficient documentation

## 2017-05-28 DIAGNOSIS — Z950 Presence of cardiac pacemaker: Secondary | ICD-10-CM | POA: Diagnosis present

## 2017-05-28 DIAGNOSIS — Z959 Presence of cardiac and vascular implant and graft, unspecified: Secondary | ICD-10-CM

## 2017-05-28 DIAGNOSIS — I110 Hypertensive heart disease with heart failure: Secondary | ICD-10-CM | POA: Insufficient documentation

## 2017-05-28 DIAGNOSIS — I5082 Biventricular heart failure: Secondary | ICD-10-CM | POA: Insufficient documentation

## 2017-05-28 DIAGNOSIS — Z952 Presence of prosthetic heart valve: Secondary | ICD-10-CM | POA: Insufficient documentation

## 2017-05-28 DIAGNOSIS — Z6841 Body Mass Index (BMI) 40.0 and over, adult: Secondary | ICD-10-CM | POA: Diagnosis not present

## 2017-05-28 DIAGNOSIS — I428 Other cardiomyopathies: Secondary | ICD-10-CM

## 2017-05-28 DIAGNOSIS — I422 Other hypertrophic cardiomyopathy: Secondary | ICD-10-CM | POA: Diagnosis not present

## 2017-05-28 DIAGNOSIS — Z87891 Personal history of nicotine dependence: Secondary | ICD-10-CM | POA: Diagnosis not present

## 2017-05-28 DIAGNOSIS — Y838 Other surgical procedures as the cause of abnormal reaction of the patient, or of later complication, without mention of misadventure at the time of the procedure: Secondary | ICD-10-CM | POA: Insufficient documentation

## 2017-05-28 DIAGNOSIS — I472 Ventricular tachycardia: Secondary | ICD-10-CM

## 2017-05-28 DIAGNOSIS — I5032 Chronic diastolic (congestive) heart failure: Secondary | ICD-10-CM | POA: Insufficient documentation

## 2017-05-28 DIAGNOSIS — I4729 Other ventricular tachycardia: Secondary | ICD-10-CM

## 2017-05-28 DIAGNOSIS — Z79899 Other long term (current) drug therapy: Secondary | ICD-10-CM | POA: Diagnosis not present

## 2017-05-28 DIAGNOSIS — I5042 Chronic combined systolic (congestive) and diastolic (congestive) heart failure: Secondary | ICD-10-CM | POA: Diagnosis present

## 2017-05-28 HISTORY — PX: BIV UPGRADE: EP1202

## 2017-05-28 LAB — PROTIME-INR
INR: 2.61
Prothrombin Time: 27.7 seconds — ABNORMAL HIGH (ref 11.4–15.2)

## 2017-05-28 LAB — SURGICAL PCR SCREEN
MRSA, PCR: NEGATIVE
Staphylococcus aureus: NEGATIVE

## 2017-05-28 SURGERY — BIV UPGRADE

## 2017-05-28 MED ORDER — MUPIROCIN 2 % EX OINT
TOPICAL_OINTMENT | CUTANEOUS | Status: AC
  Administered 2017-05-28: 1 via TOPICAL
  Filled 2017-05-28: qty 22

## 2017-05-28 MED ORDER — FENTANYL CITRATE (PF) 100 MCG/2ML IJ SOLN
INTRAMUSCULAR | Status: DC | PRN
  Administered 2017-05-28 (×2): 50 ug via INTRAVENOUS
  Administered 2017-05-28 (×7): 25 ug via INTRAVENOUS

## 2017-05-28 MED ORDER — LIDOCAINE HCL (PF) 1 % IJ SOLN
INTRAMUSCULAR | Status: AC
  Filled 2017-05-28: qty 60

## 2017-05-28 MED ORDER — CHLORHEXIDINE GLUCONATE 4 % EX LIQD: 60.0000 mL | Freq: Once | CUTANEOUS | Status: DC

## 2017-05-28 MED ORDER — OMEGA-3-ACID ETHYL ESTERS 1 G PO CAPS
2.0000 g | ORAL_CAPSULE | Freq: Two times a day (BID) | ORAL | Status: DC
  Administered 2017-05-28 – 2017-05-29 (×2): 2 g via ORAL
  Filled 2017-05-28 (×2): qty 2

## 2017-05-28 MED ORDER — IOPAMIDOL (ISOVUE-370) INJECTION 76%
INTRAVENOUS | Status: DC | PRN
  Administered 2017-05-28: 20 mL via INTRAVENOUS
  Administered 2017-05-28: 5 mL via INTRAVENOUS
  Administered 2017-05-28: 5 mL

## 2017-05-28 MED ORDER — MIDAZOLAM HCL 5 MG/5ML IJ SOLN
INTRAMUSCULAR | Status: AC
  Filled 2017-05-28: qty 5

## 2017-05-28 MED ORDER — ACETAMINOPHEN 325 MG PO TABS: 325.0000 mg | ORAL_TABLET | ORAL | Status: DC | PRN

## 2017-05-28 MED ORDER — FENTANYL CITRATE (PF) 100 MCG/2ML IJ SOLN
INTRAMUSCULAR | Status: AC
  Filled 2017-05-28: qty 2

## 2017-05-28 MED ORDER — MIDAZOLAM HCL 5 MG/5ML IJ SOLN
INTRAMUSCULAR | Status: DC | PRN
  Administered 2017-05-28: 2 mg via INTRAVENOUS
  Administered 2017-05-28: 1 mg via INTRAVENOUS
  Administered 2017-05-28 (×2): 2 mg via INTRAVENOUS
  Administered 2017-05-28 (×2): 1 mg via INTRAVENOUS
  Administered 2017-05-28: 2 mg via INTRAVENOUS
  Administered 2017-05-28: 1 mg via INTRAVENOUS
  Administered 2017-05-28: 2 mg via INTRAVENOUS

## 2017-05-28 MED ORDER — SODIUM CHLORIDE 0.9 % IV SOLN
INTRAVENOUS | Status: DC
  Administered 2017-05-28 (×2): via INTRAVENOUS

## 2017-05-28 MED ORDER — SODIUM CHLORIDE 0.9 % IR SOLN
80.0000 mg | Status: AC
  Administered 2017-05-28: 80 mg

## 2017-05-28 MED ORDER — SODIUM CHLORIDE 0.9 % IV SOLN: INTRAVENOUS | Status: DC

## 2017-05-28 MED ORDER — MUPIROCIN 2 % EX OINT
1.0000 "application " | TOPICAL_OINTMENT | Freq: Once | CUTANEOUS | Status: AC
  Administered 2017-05-28: 1 via TOPICAL
  Filled 2017-05-28: qty 22

## 2017-05-28 MED ORDER — MAGNESIUM OXIDE 250 MG PO TABS: 1.0000 | ORAL_TABLET | Freq: Three times a day (TID) | ORAL | Status: DC

## 2017-05-28 MED ORDER — LOSARTAN POTASSIUM 50 MG PO TABS: 100.0000 mg | ORAL_TABLET | Freq: Every day | ORAL | Status: DC

## 2017-05-28 MED ORDER — IOPAMIDOL (ISOVUE-370) INJECTION 76%
INTRAVENOUS | Status: AC
  Filled 2017-05-28: qty 50

## 2017-05-28 MED ORDER — SPIRONOLACTONE 25 MG PO TABS
25.0000 mg | ORAL_TABLET | Freq: Every day | ORAL | Status: DC
  Administered 2017-05-29: 11:00:00 25 mg via ORAL
  Filled 2017-05-28 (×2): qty 1

## 2017-05-28 MED ORDER — SPIRONOLACTONE 25 MG PO TABS: 25.0000 mg | ORAL_TABLET | Freq: Every day | ORAL | Status: DC

## 2017-05-28 MED ORDER — CEFAZOLIN SODIUM-DEXTROSE 2-4 GM/100ML-% IV SOLN: 2.0000 g | INTRAVENOUS | Status: DC

## 2017-05-28 MED ORDER — ASPIRIN 81 MG PO CHEW
81.0000 mg | CHEWABLE_TABLET | Freq: Every day | ORAL | Status: DC
  Administered 2017-05-29: 11:00:00 81 mg via ORAL
  Filled 2017-05-28: qty 1

## 2017-05-28 MED ORDER — SODIUM CHLORIDE 0.9 % IV SOLN: INTRAVENOUS | Status: AC

## 2017-05-28 MED ORDER — SODIUM CHLORIDE 0.9 % IR SOLN
Status: AC
  Filled 2017-05-28: qty 2

## 2017-05-28 MED ORDER — LIDOCAINE HCL (PF) 1 % IJ SOLN
INTRAMUSCULAR | Status: AC
  Filled 2017-05-28: qty 30

## 2017-05-28 MED ORDER — MAGNESIUM OXIDE 400 (241.3 MG) MG PO TABS
200.0000 mg | ORAL_TABLET | Freq: Three times a day (TID) | ORAL | Status: DC
  Administered 2017-05-28 – 2017-05-29 (×2): 200 mg via ORAL
  Filled 2017-05-28 (×3): qty 1

## 2017-05-28 MED ORDER — DEXTROSE 5 % IV SOLN
3.0000 g | INTRAVENOUS | Status: AC
  Administered 2017-05-28: 3 g via INTRAVENOUS

## 2017-05-28 MED ORDER — LIDOCAINE HCL (PF) 1 % IJ SOLN
INTRAMUSCULAR | Status: DC | PRN
  Administered 2017-05-28: 90 mL

## 2017-05-28 MED ORDER — OMEGA-3 FATTY ACIDS 1000 MG PO CAPS: 4.0000 g | ORAL_CAPSULE | Freq: Every morning | ORAL | Status: DC

## 2017-05-28 MED ORDER — HEPARIN (PORCINE) IN NACL 2-0.9 UNIT/ML-% IJ SOLN
INTRAMUSCULAR | Status: AC | PRN
  Administered 2017-05-28: 500 mL

## 2017-05-28 MED ORDER — CEFAZOLIN SODIUM-DEXTROSE 1-4 GM/50ML-% IV SOLN
1.0000 g | Freq: Four times a day (QID) | INTRAVENOUS | Status: AC
  Administered 2017-05-28 – 2017-05-29 (×3): 1 g via INTRAVENOUS
  Filled 2017-05-28 (×5): qty 50

## 2017-05-28 MED ORDER — LOSARTAN POTASSIUM 50 MG PO TABS
100.0000 mg | ORAL_TABLET | Freq: Every day | ORAL | Status: DC
  Administered 2017-05-29: 100 mg via ORAL
  Filled 2017-05-28 (×2): qty 2

## 2017-05-28 MED ORDER — WARFARIN - PHYSICIAN DOSING INPATIENT
Freq: Every day | Status: DC
  Administered 2017-05-28: 18:00:00

## 2017-05-28 MED ORDER — TORSEMIDE 20 MG PO TABS
40.0000 mg | ORAL_TABLET | Freq: Two times a day (BID) | ORAL | Status: DC
  Administered 2017-05-29: 40 mg via ORAL
  Filled 2017-05-28 (×2): qty 2

## 2017-05-28 MED ORDER — ADULT MULTIVITAMIN W/MINERALS CH
1.0000 | ORAL_TABLET | Freq: Every day | ORAL | Status: DC
  Administered 2017-05-29: 11:00:00 1 via ORAL
  Filled 2017-05-28 (×3): qty 1

## 2017-05-28 MED ORDER — DIPHENHYDRAMINE-APAP (SLEEP) 25-500 MG PO TABS: 2.0000 | ORAL_TABLET | Freq: Every evening | ORAL | Status: DC | PRN

## 2017-05-28 MED ORDER — VITAMIN C 500 MG PO TABS
2000.0000 mg | ORAL_TABLET | Freq: Every day | ORAL | Status: DC
  Administered 2017-05-29: 11:00:00 2000 mg via ORAL
  Filled 2017-05-28: qty 4

## 2017-05-28 MED ORDER — WARFARIN SODIUM 5 MG PO TABS
5.0000 mg | ORAL_TABLET | Freq: Once | ORAL | Status: AC
  Administered 2017-05-28: 19:00:00 5 mg via ORAL
  Filled 2017-05-28: qty 1

## 2017-05-28 MED ORDER — POTASSIUM CHLORIDE ER 10 MEQ PO TBCR: 40.0000 meq | EXTENDED_RELEASE_TABLET | ORAL | Status: DC

## 2017-05-28 MED ORDER — WARFARIN SODIUM 10 MG PO TABS: 10.0000 mg | ORAL_TABLET | Freq: Once | ORAL | Status: DC

## 2017-05-28 MED ORDER — ONDANSETRON HCL 4 MG/2ML IJ SOLN: 4.0000 mg | Freq: Four times a day (QID) | INTRAMUSCULAR | Status: DC | PRN

## 2017-05-28 SURGICAL SUPPLY — 14 items
ALLURE CRT PM3262 (Pacemaker) ×3 IMPLANT
CABLE SURGICAL S-101-97-12 (CABLE) ×2 IMPLANT
CATH CPS DIRECT 135 DS2C020 (CATHETERS) ×2 IMPLANT
HEMOSTAT SURGICEL 2X4 FIBR (HEMOSTASIS) ×4 IMPLANT
KIT ESSENTIALS PG (KITS) ×2 IMPLANT
LEAD CAPSURE NOVUS 5076-58CM (Lead) ×2 IMPLANT
LEAD QUARTET 1458Q-92CM (Lead) ×2 IMPLANT
PACEMAKER ALLURE CRT (Pacemaker) IMPLANT
PAD DEFIB LIFELINK (PAD) ×2 IMPLANT
SHEATH CLASSIC 7F (SHEATH) ×2 IMPLANT
SHEATH CLASSIC 9.5F (SHEATH) ×2 IMPLANT
TRAY PACEMAKER INSERTION (PACKS) ×2 IMPLANT
WIRE ACUITY WHISPER EDS 4648 (WIRE) ×2 IMPLANT
WIRE HI TORQ VERSACORE-J 145CM (WIRE) ×4 IMPLANT

## 2017-05-28 NOTE — Progress Notes (Signed)
Pt placed on Cpap auto titrate with 3 L O2 for procedure in cath lab. Pt tolerating well at this time.

## 2017-05-28 NOTE — Discharge Summary (Signed)
ELECTROPHYSIOLOGY PROCEDURE DISCHARGE SUMMARY    Patient ID: Frank [name],  MRN: [id number], DOB/AGE: June 27, 1945 72 y.o.  Admit date: 05/28/2017 Discharge date: 05/29/17  Primary Care Physician: [name], MD  Electrophysiologist: Dr. [name]  Primary Discharge Diagnosis:  1. Symptomatic bradycardia, PPM     RV lead failure 2. Chronic CHF  Secondary Discharge Diagnosis:  1. VHD     Hx of mechanical MVR, AV repair 2. HCM    Hx of myomectomy 3. Paroxysmal AFib     CHA2DS2Vasc is 3, on Warfarin, monitored and managed with the coumadin clinic  Allergies  Allergen Reactions  . Warfarin And Related Other (See Comments)    Per the patient's wife, "Warfain made him have a TIA" CAN TAKE BRAND NAME COUMADIN      Procedures This Admission:  1.  PPM upgrade to CRT-P, new RV lead, 05/28/17, Dr. [name] Medtronic 507 658 cm active fixation lead for serial number [id number], [place] 9323F lead serial number [id number],  St Jude pulse generator, serial number [id number] There were no immediate post procedure complications. 2.  CXR on 03/28/18 described no pneumothorax status post device implantation.   Brief HPI: Ladonte Verstraete is a 72 y.o. male is followed by Dr. [name], with decreasing LVEF, CHF, and RV lead failure, decision to upgrade to CRT and replace RV lead was made.  Past medical history is noted above.   Risks, benefits, and alternatives to planned procedure were reviewed with the patient who wished to proceed.   Hospital Course:  The patient was admitted and underwent new RV lead and upgrade to CRT-P with details as outlined above.  He was monitored on telemetry overnight which demonstrated AV paced rhythm.  Left chest was without hematoma or ecchymosis.  The device was interrogated and found to be functioning normally.  CXR was obtained and described no pneumothorax status post device implantation, described stable mild vascular congestion, the patient denies  any SOB, he will take an additional torsemide today and tomorrow.  Wound care, arm mobility, and restrictions were reviewed with the patient.  The patient was examined by Dr. [name] and considered stable for discharge to home.    Physical Exam: Vitals:   05/28/17 1800 05/28/17 2039 05/29/17 0221 05/29/17 0730  BP: (!) 137/114 (!) 100/55 (!) 113/36 (!) 115/34  Pulse: 68 64 64 64  Resp:  12 18 (!) 22  Temp:  (!) 97.5 F (36.4 C) 97.6 F (36.4 C) 98 F (36.7 C)  TempSrc:  Oral Oral Oral  SpO2: 96% 92% 91% 93%  Weight:   (!) 313 lb 0.9 oz (142 kg)   Height:        GEN- The patient is well appearing, alert and oriented x 3 today.   HEENT: normocephalic, atraumatic; sclera clear, conjunctiva pink; hearing intact; oropharynx clear; neck supple, no JVP Lungs- CTA b/l, normal work of breathing.  No wheezes, rales, rhonchi Heart- RRR, valve is appreciated,   No rubs or gallops, PMI not laterally displaced GI- soft, non-tender, non-distended Extremities- no clubbing, cyanosis, no pitting edema MS- no significant deformity or atrophy Skin- warm and dry, no rash or lesion,  left chest without hematoma/ecchymosis Psych- euthymic mood, full affect Neuro- no gross deficits   Labs:   Lab Results  Component Value Date   WBC 7.8 05/23/2017   HGB 13.3 05/23/2017   HCT 37.8 05/23/2017   MCV 96 05/23/2017   PLT 239 05/23/2017    Recent Labs  Lab 05/23/17 1148  NA 140  K 3.9  CL 100  CO2 23  BUN 19  CREATININE 1.12  CALCIUM 9.7  GLUCOSE 113*    Discharge Medications:  Allergies as of 05/29/2017      Reactions   Warfarin And Related Other (See Comments)   Per the patient's wife, "Warfain made him have a TIA" CAN TAKE BRAND NAME COUMADIN       Medication List    TAKE these medications   aspirin 81 MG tablet Take 81 mg by mouth daily.   COUMADIN 10 MG tablet Generic drug:  warfarin Take as directed. If you are unsure how to take this medication, talk to your nurse or  doctor. Original instructions:  TAKE AS DIRECTED BY COUMADIN CLINIC What changed:  See the new instructions.   diphenhydramine-acetaminophen 25-500 MG Tabs tablet Commonly known as:  TYLENOL PM Take 2 tablets by mouth at bedtime as needed (for sleep).   fish oil-omega-3 fatty acids 1000 MG capsule Take 4 g by mouth every morning.   losartan 100 MG tablet Commonly known as:  COZAAR Take 1 tablet (100 mg total) by mouth daily.   losartan 100 MG tablet Commonly known as:  COZAAR Take 1 tablet (100 mg total) by mouth daily.   Magnesium Oxide 250 MG Tabs Take 1 tablet (250 mg total) by mouth 3 (three) times daily.   multivitamin tablet Take 1 tablet by mouth daily.   potassium chloride 10 MEQ tablet Commonly known as:  K-DUR Take 11 tablets (110 mEq total) by mouth daily. TAKE 11 TABLETS BY MOUTH ONCE DAILY What changed:    how much to take  when to take this  additional instructions Notes to patient:  Take one additional tablet today and tomorrow (given extra torsemide dose both days) as you usually do when taking an extra as needed torsemide dose   spironolactone 25 MG tablet Commonly known as:  ALDACTONE Take 1 tablet (25 mg total) by mouth daily.   spironolactone 25 MG tablet Commonly known as:  ALDACTONE Take 1 tablet (25 mg total) by mouth daily.   torsemide 20 MG tablet Commonly known as:  DEMADEX Take 2 tablets (40 mg total) by mouth 2 (two) times daily. Notes to patient:  Take one additional tablet (20mg ) late afternoon today and tomorrow as we discussed   vitamin C 1000 MG tablet Take 2,000 mg by mouth daily.       Disposition:  Home Discharge Instructions    Diet - low sodium heart healthy   Complete by:  As directed    Increase activity slowly   Complete by:  As directed      Follow-up Information    [place] Office Follow up on 06/06/2017.   Specialty:  Cardiology Why:  8:45AM, coumadin clinic/lab Contact information: [address], [place]       [place] Follow up on 06/08/2017.   Specialty:  Cardiology Why:  10:00AM, wound check visit Contact information: [address] [place]       [name], MD Follow up on 08/28/2017.   Specialty:  Cardiology Why:  4:00PM Contact information: [address] [address] [phone]           Duration of Discharge Encounter: Greater than 30 minutes including physician time.  Venetia Night, PA-C 05/29/2017 10:37 AM  Instructions given Examined Device >> high thresholds in the bipolar mode but 1 - can with good threshold

## 2017-05-29 ENCOUNTER — Ambulatory Visit (HOSPITAL_COMMUNITY): Payer: Medicare Other

## 2017-05-29 ENCOUNTER — Encounter (HOSPITAL_COMMUNITY): Payer: Self-pay | Admitting: Internal Medicine

## 2017-05-29 DIAGNOSIS — T82190A Other mechanical complication of cardiac electrode, initial encounter: Secondary | ICD-10-CM | POA: Diagnosis not present

## 2017-05-29 DIAGNOSIS — I5082 Biventricular heart failure: Secondary | ICD-10-CM | POA: Diagnosis not present

## 2017-05-29 DIAGNOSIS — I422 Other hypertrophic cardiomyopathy: Secondary | ICD-10-CM | POA: Diagnosis not present

## 2017-05-29 DIAGNOSIS — Z85820 Personal history of malignant melanoma of skin: Secondary | ICD-10-CM | POA: Diagnosis not present

## 2017-05-29 DIAGNOSIS — Z6841 Body Mass Index (BMI) 40.0 and over, adult: Secondary | ICD-10-CM | POA: Diagnosis not present

## 2017-05-29 DIAGNOSIS — Z87891 Personal history of nicotine dependence: Secondary | ICD-10-CM | POA: Diagnosis not present

## 2017-05-29 DIAGNOSIS — I5032 Chronic diastolic (congestive) heart failure: Secondary | ICD-10-CM | POA: Diagnosis not present

## 2017-05-29 DIAGNOSIS — Z8673 Personal history of transient ischemic attack (TIA), and cerebral infarction without residual deficits: Secondary | ICD-10-CM | POA: Diagnosis not present

## 2017-05-29 DIAGNOSIS — R0989 Other specified symptoms and signs involving the circulatory and respiratory systems: Secondary | ICD-10-CM | POA: Diagnosis not present

## 2017-05-29 DIAGNOSIS — I442 Atrioventricular block, complete: Secondary | ICD-10-CM | POA: Diagnosis not present

## 2017-05-29 DIAGNOSIS — I110 Hypertensive heart disease with heart failure: Secondary | ICD-10-CM | POA: Diagnosis not present

## 2017-05-29 DIAGNOSIS — Z950 Presence of cardiac pacemaker: Secondary | ICD-10-CM | POA: Diagnosis not present

## 2017-05-29 DIAGNOSIS — Z952 Presence of prosthetic heart valve: Secondary | ICD-10-CM | POA: Diagnosis not present

## 2017-05-29 LAB — PROTIME-INR
INR: 2.68
Prothrombin Time: 28.3 seconds — ABNORMAL HIGH (ref 11.4–15.2)

## 2017-05-29 MED ORDER — YOU HAVE A PACEMAKER BOOK
Freq: Once | Status: AC
  Administered 2017-05-29: 03:00:00
  Filled 2017-05-29: qty 1

## 2017-05-29 MED FILL — Gentamicin Sulfate Inj 40 MG/ML: INTRAMUSCULAR | Qty: 80 | Status: AC

## 2017-05-29 NOTE — Discharge Instructions (Signed)
° ° °  Supplemental Discharge Instructions for  Pacemaker/Defibrillator Patients  Activity No heavy lifting or vigorous activity with your left/right arm for 6 to 8 weeks.  Do not raise your left/right arm above your head for one week.  Gradually raise your affected arm as drawn below.              06/01/17                    06/02/17                     06/03/17                  06/04/17   __  NO DRIVING for  1 week   ; you may begin driving on   9/32/35  .  WOUND CARE - Keep the wound area clean and dry.  Do not get this area wet, no showers for 1 week ; you may shower on  06/04/17 . - The tape/steri-strips on your wound will fall off; do not pull them off.  No bandage is needed on the site.  DO  NOT apply any creams, oils, or ointments to the wound area. - If you notice any drainage or discharge from the wound, any swelling or bruising at the site, or you develop a fever > 101? F after you are discharged home, call the office at once.  Special Instructions - You are still able to use cellular telephones; use the ear opposite the side where you have your pacemaker/defibrillator.  Avoid carrying your cellular phone near your device. - When traveling through airports, show security personnel your identification card to avoid being screened in the metal detectors.  Ask the security personnel to use the hand wand. - Avoid arc welding equipment, MRI testing (magnetic resonance imaging), TENS units (transcutaneous nerve stimulators).  Call the office for questions about other devices. - Avoid electrical appliances that are in poor condition or are not properly grounded. - Microwave ovens are safe to be near or to operate.  Additional information for defibrillator patients should your device go off: - If your device goes off ONCE and you feel fine afterward, notify the device clinic nurses. - If your device goes off ONCE and you do not feel well afterward, call 911. - If your device goes off TWICE,  call 911. - If your device goes off THREE times in one day, call 911.  DO NOT DRIVE YOURSELF OR A FAMILY MEMBER WITH A DEFIBRILLATOR TO THE HOSPITAL--CALL 911.

## 2017-05-29 NOTE — Telephone Encounter (Signed)
Pt had device implant, was inpatient.

## 2017-06-01 ENCOUNTER — Telehealth: Payer: Self-pay | Admitting: Internal Medicine

## 2017-06-01 ENCOUNTER — Encounter (HOSPITAL_COMMUNITY): Payer: Self-pay | Admitting: Internal Medicine

## 2017-06-01 ENCOUNTER — Encounter: Payer: Self-pay | Admitting: Internal Medicine

## 2017-06-01 NOTE — Telephone Encounter (Signed)
Spoke with pt, informed him that the bruising in the picture was not abnormal for 4 days s/p procedure especially with pt taking coumadin. Informed pt that if site puffs up dramatically or starts bleeding he should go the ED over the weekend, pt voiced understanding.

## 2017-06-01 NOTE — Telephone Encounter (Signed)
F/U call:  Patient following up on e-mail that he sent today. Patient states that he has a couple of questions.

## 2017-06-01 NOTE — Telephone Encounter (Signed)
See phone note in reference to [employer] and image

## 2017-06-04 ENCOUNTER — Encounter: Payer: Self-pay | Admitting: Internal Medicine

## 2017-06-06 ENCOUNTER — Ambulatory Visit (INDEPENDENT_AMBULATORY_CARE_PROVIDER_SITE_OTHER): Payer: Medicare Other | Admitting: *Deleted

## 2017-06-06 DIAGNOSIS — G459 Transient cerebral ischemic attack, unspecified: Secondary | ICD-10-CM | POA: Diagnosis not present

## 2017-06-06 DIAGNOSIS — I4891 Unspecified atrial fibrillation: Secondary | ICD-10-CM | POA: Diagnosis not present

## 2017-06-06 DIAGNOSIS — Z5181 Encounter for therapeutic drug level monitoring: Secondary | ICD-10-CM

## 2017-06-06 DIAGNOSIS — Z9889 Other specified postprocedural states: Secondary | ICD-10-CM

## 2017-06-06 LAB — POCT INR: INR: 2.9

## 2017-06-06 NOTE — Patient Instructions (Signed)
Description   Continue taking 1 tablet everyday except 1.5 tablets on Sundays and Wednesdays.  Recheck INR in 6 weeks. [phone]

## 2017-06-07 ENCOUNTER — Encounter: Payer: Self-pay | Admitting: Pulmonary Disease

## 2017-06-08 ENCOUNTER — Ambulatory Visit: Payer: Medicare Other

## 2017-06-08 ENCOUNTER — Ambulatory Visit (INDEPENDENT_AMBULATORY_CARE_PROVIDER_SITE_OTHER): Payer: Medicare Other | Admitting: Pulmonary Disease

## 2017-06-08 ENCOUNTER — Encounter: Payer: Self-pay | Admitting: Pulmonary Disease

## 2017-06-08 VITALS — BP 130/62 | HR 61 | Ht 71.0 in | Wt 326.8 lb

## 2017-06-08 DIAGNOSIS — R942 Abnormal results of pulmonary function studies: Secondary | ICD-10-CM | POA: Diagnosis not present

## 2017-06-08 DIAGNOSIS — J8410 Pulmonary fibrosis, unspecified: Secondary | ICD-10-CM | POA: Diagnosis not present

## 2017-06-08 DIAGNOSIS — G4733 Obstructive sleep apnea (adult) (pediatric): Secondary | ICD-10-CM

## 2017-06-08 NOTE — Progress Notes (Signed)
   Subjective:    Patient ID: Frank [name], male    DOB: [birth year], 72 y.o.   MRN: [id number]  HPI    Review of Systems  Constitutional: Negative for fever and unexpected weight change.  HENT: Positive for nosebleeds. Negative for congestion, dental problem, ear pain, postnasal drip, rhinorrhea, sinus pressure, sneezing and trouble swallowing.   Eyes: Negative for redness and itching.  Respiratory: Negative for cough, chest tightness, shortness of breath and wheezing.   Cardiovascular: Positive for leg swelling. Negative for palpitations.  Gastrointestinal: Negative for nausea and vomiting.  Genitourinary: Negative for dysuria.  Musculoskeletal: Negative for joint swelling.  Skin: Negative for rash.  Allergic/Immunologic: Negative.  Negative for environmental allergies, food allergies and immunocompromised state.  Neurological: Negative for headaches.  Hematological: Bruises/bleeds easily.  Psychiatric/Behavioral: Negative for dysphoric mood. The patient is not nervous/anxious.        Objective:   Physical Exam        Assessment & Plan:

## 2017-06-08 NOTE — Telephone Encounter (Signed)
Forwarding to RA as FYI- RA please see attached documents at pt request.  Thanks!

## 2017-06-08 NOTE — Assessment & Plan Note (Signed)
Due to finding of left basilar crackles and persistent interstitial prominence on chest x-ray although he appears to be euvolemic, will proceed with high-resolution CT chest to evaluate for pulmonary fibrosis. I am not impressed with his CT chest from 2009

## 2017-06-08 NOTE — Progress Notes (Signed)
Subjective:    Patient ID: Frank [name], male    DOB: Oct 21, 1945, 72 y.o.   MRN: [id number]  HPI  Chief Complaint  Patient presents with  . Pulmonary Consult    referred by Dr [name], having low sats, sent graphs in, sats drop when he lays down,had new pacemaker placed and seems to have better sats since     72 year old man with chronic systolic heart failure and PPM, this was recently upgraded to dual-chamber for cardiac resynchronization therapy.  He also has a history of hypertrophic cardiomyopathy and history of myomectomy for this, mechanical mitral valve and aortic valve repair.  He also has paroxysmal atrial fibrillation maintained on Coumadin He is referred for evaluation of hypoxia  He was hospitalized in October for CHF exacerbation, diuresed and has lost 26 pounds since then to his current weight of 326 pounds.  He was provided with a wrist pulse oximeter and he showed is at least 2 tracings for about an hour but his saturation drops to about 89% from baseline of 94%, apparently was just resting then.  He is also noticed that when he lies down his oxygen saturation drops 2-3 points. On walking around the office today his oxygen saturation baseline was 94% and dropped to about 93%When walking, when lying down the lowest he got was 93%.  He is compliant with diuretics, and denies orthopnea or paroxysmal nocturnal dyspnea. Epworth sleepiness score is 4. Bedtime is around 10 PM, sleeps on his right side with one pillow, reports nocturia due to diuretics and is up around 5:20 AM, makes coffee for his wife and then sleeps again until 6:15 AM. There is no history suggestive of cataplexy, sleep paralysis or parasomnias  He had a panic attack when placed on a full facemask with CPAP during his pacemaker placement.  Imaging studies were reviewed Chest x-ray 2/19 shows vascular congestion and interstitial prominence. CT chest 05/2007 does not show any evidence of interstitial lung  disease     Significant tests/ events reviewed  Echo 01/2017 EF 45-50%, decreased RV systolic function with RVSP 22    Past Medical History:  Diagnosis Date  . Anxiety   . Atrial arrhythmia    status post ablation in [place] with complication including damage to the aortic valve  . Atrial fibrillation ([place])   . AV block, 1st degree   . Bradycardia   . Diastolic CHF, chronic ([place])   . Diverticulosis   . History of melanoma   . HTN (hypertension)   . Hypertrophic cardiomyopathy ([place])    s/p myoectomy  . Internal hemorrhoids   . mitral valve replacement-St. Jude's mechanical   . Morbid obesity ([place])   . Pacemaker  -SJM   . S/P aortic valve repair   . TIA (transient ischemic attack) 2001  . Tubulovillous adenoma    Past Surgical History:  Procedure Laterality Date  . AORTIC VALVE REPLACEMENT     mechanical-following traumatic injury during an ablation procedure  . BIV UPGRADE  05/28/2017  . BIV UPGRADE N/A 05/28/2017   Procedure: BIV UPGRADE;  Surgeon: [name], MD;  Location: [facility];  Service: Cardiovascular;  Laterality: N/A;  . COLONOSCOPY    . COLONOSCOPY WITH PROPOFOL N/A 04/13/2016   Procedure: COLONOSCOPY WITH PROPOFOL;  Surgeon: Gatha Mayer, MD;  Location: [facility];  Service: Endoscopy;  Laterality: N/A;  . MITRAL VALVE REPLACEMENT     mechanical  . PACEMAKER INSERTION     St Jude  [employer]   . septal myectomy      Allergies  Allergen Reactions  . Warfarin And Related Other (See Comments)    Per the patient's wife, "Warfain made him have a TIA" CAN TAKE BRAND NAME COUMADIN     Social History   Socioeconomic History  . Marital status: Married    Spouse name: Not on file  . Number of children: 1  . Years of education: Not on file  . Highest education level: Not on file  Social Needs  . Financial resource strain: Not on file  . Food insecurity - worry: Not on file  . Food insecurity - inability: Not on file  . Transportation  needs - medical: Not on file  . Transportation needs - non-medical: Not on file  Occupational History  . Occupation: Corporate investment banker, retired    Fish farm manager: [employer]  Tobacco Use  . Smoking status: Former Smoker    Types: Cigarettes    Last attempt to quit: 04/10/1977    Years since quitting: 40.1  . Smokeless tobacco: Never Used  Substance and Sexual Activity  . Alcohol use: Yes    Comment: occasional  . Drug use: No  . Sexual activity: Not on file  Other Topics Concern  . Not on file  Social History Narrative   Married, one adopted son and one daughter. 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   2 caffeinated beverages daily     Family History  Problem Relation Age of Onset  . Heart disease Sister   . Uterine cancer Sister   . Obesity Sister        500+ lbs  . Heart disease Brother   . Heart disease Sister   . Heart disease Father   . Colon cancer Father 76  . Crohn's disease Paternal Grandmother      Review of Systems Positive for shortness of breath and activity, nonproductive cough, weight drop of 26 pounds, chronic swelling of right foot  Constitutional: negative for anorexia, fevers and sweats  Eyes: negative for irritation, redness and visual disturbance  Ears, nose, mouth, throat, and face: negative for earaches, epistaxis, nasal congestion and sore throat  Respiratory: negative for cough,sputum and wheezing  Cardiovascular: negative for chest pain,  orthopnea, palpitations and syncope  Gastrointestinal: negative for abdominal pain, constipation, diarrhea, melena, nausea and vomiting  Genitourinary:negative for dysuria, frequency and hematuria  Hematologic/lymphatic: negative for bleeding, easy bruising and lymphadenopathy  Musculoskeletal:negative for arthralgias, muscle weakness and stiff joints  Neurological: negative for coordination problems, gait problems, headaches and weakness  Endocrine: negative for  diabetic symptoms including polydipsia     Objective:   Physical Exam  Gen. Pleasant, obese, in no distress, normal affect ENT - no lesions, no post nasal drip, class 2-3 airway Neck: No JVD, no thyromegaly, no carotid bruits Lungs: no use of accessory muscles, no dullness to percussion, decreased without rales or rhonchi  Cardiovascular: Rhythm regular, heart sounds  normal, no murmurs or gallops, 1+ peripheral edema R >> L Abdomen: soft and non-tender, no hepatosplenomegaly, BS normal. Musculoskeletal: No deformities, no cyanosis or clubbing Neuro:  alert, non focal, no tremors       Assessment & Plan:

## 2017-06-08 NOTE — Assessment & Plan Note (Signed)
Given excessive daytime somnolence, narrow pharyngeal exam, witnessed apneas & loud snoring, obstructive sleep apnea is very likely & an overnight polysomnogram will be scheduled as a home study. The pathophysiology of obstructive sleep apnea , it's cardiovascular consequences & modes of treatment including CPAP were discused with the patient in detail & they evidenced understanding.  Pretest probability is intermediate. However will be a challenge because he did not tolerate full facemask with his CPAP during his pacemaker placement.  If he does need CPAP we may have to use nasal pillows or nasal mask

## 2017-06-08 NOTE — Patient Instructions (Signed)
Home sleep study.  High-resolution CT scan of your chest to look for scarring in the lungs

## 2017-06-13 ENCOUNTER — Ambulatory Visit (INDEPENDENT_AMBULATORY_CARE_PROVIDER_SITE_OTHER): Payer: Medicare Other | Admitting: *Deleted

## 2017-06-13 ENCOUNTER — Ambulatory Visit (INDEPENDENT_AMBULATORY_CARE_PROVIDER_SITE_OTHER)
Admission: RE | Admit: 2017-06-13 | Discharge: 2017-06-13 | Disposition: A | Discharge status: Home / Self Care | Payer: Medicare Other | Source: Ambulatory Visit | Attending: Pulmonary Disease | Admitting: Pulmonary Disease

## 2017-06-13 ENCOUNTER — Ambulatory Visit: Payer: Medicare Other

## 2017-06-13 DIAGNOSIS — I428 Other cardiomyopathies: Secondary | ICD-10-CM | POA: Diagnosis not present

## 2017-06-13 DIAGNOSIS — Z950 Presence of cardiac pacemaker: Secondary | ICD-10-CM

## 2017-06-13 DIAGNOSIS — I441 Atrioventricular block, second degree: Secondary | ICD-10-CM

## 2017-06-13 DIAGNOSIS — I700 Atherosclerosis of aorta: Secondary | ICD-10-CM | POA: Diagnosis not present

## 2017-06-13 DIAGNOSIS — J8410 Pulmonary fibrosis, unspecified: Secondary | ICD-10-CM | POA: Diagnosis not present

## 2017-06-13 DIAGNOSIS — I4891 Unspecified atrial fibrillation: Secondary | ICD-10-CM

## 2017-06-13 DIAGNOSIS — R0902 Hypoxemia: Secondary | ICD-10-CM | POA: Diagnosis not present

## 2017-06-13 LAB — CUP PACEART INCLINIC DEVICE CHECK
Battery Remaining Longevity: 39 mo
Battery Voltage: 3.01 V
Brady Statistic RA Percent Paced: 84 %
Date Time Interrogation Session: 20190306103749
Implantable Lead Implant Date: 20190218
Implantable Lead Location: 753858
Implantable Lead Location: 753859
Implantable Lead Model: 5076
Lead Channel Impedance Value: 575 Ohm
Lead Channel Pacing Threshold Amplitude: 0.75 V
Lead Channel Pacing Threshold Amplitude: 0.75 V
Lead Channel Pacing Threshold Pulse Width: 0.5 ms
Lead Channel Pacing Threshold Pulse Width: 1 ms
Lead Channel Sensing Intrinsic Amplitude: 12 mV
Lead Channel Setting Pacing Amplitude: 3.5 V
Lead Channel Setting Pacing Pulse Width: 0.5 ms
Lead Channel Setting Sensing Sensitivity: 2 mV
MDC IDC LEAD IMPLANT DT: 20100414
MDC IDC LEAD IMPLANT DT: 20190218
MDC IDC LEAD LOCATION: 753860
MDC IDC MSMT LEADCHNL LV IMPEDANCE VALUE: 475 Ohm
MDC IDC MSMT LEADCHNL LV PACING THRESHOLD AMPLITUDE: 1.25 V
MDC IDC MSMT LEADCHNL LV PACING THRESHOLD AMPLITUDE: 1.25 V
MDC IDC MSMT LEADCHNL LV PACING THRESHOLD PULSEWIDTH: 1 ms
MDC IDC MSMT LEADCHNL RA IMPEDANCE VALUE: 325 Ohm
MDC IDC MSMT LEADCHNL RA SENSING INTR AMPL: 2 mV
MDC IDC MSMT LEADCHNL RV PACING THRESHOLD PULSEWIDTH: 0.5 ms
MDC IDC PG IMPLANT DT: 20190218
MDC IDC SET LEADCHNL LV PACING AMPLITUDE: 3.5 V
MDC IDC SET LEADCHNL LV PACING PULSEWIDTH: 1 ms
MDC IDC SET LEADCHNL RA PACING AMPLITUDE: 2 V
MDC IDC STAT BRADY RV PERCENT PACED: 99.04 %
Pulse Gen Model: 3262
Pulse Gen Serial Number: 8996301

## 2017-06-13 NOTE — Progress Notes (Signed)
Wound check appointment s/p CRTP upgrade 05/28/17 by Dr. [name]. Steri-strips removed. Wound without redness. Various stages of bruising noted from device pocket down left flank. Hematoma noted- patient reports that it is the same size as when he was discharged from the hospital. Incision edges approximated, wound well healed. Normal device function. Thresholds, sensing, and impedances consistent with implant measurements. Device programmed at 3.5V in RV/LV for extra safety margin until 3 month visit. Histogram distribution appropriate for patient and level of activity. 8.3% AT/AF- known PAF, on warfarin. No high ventricular rates noted. Patient educated about wound care, arm mobility, lifting restrictions and Merlin monitoring. He will call if the hematoma grows in size or becomes more firm. ROV with SK 08/28/17.

## 2017-06-14 ENCOUNTER — Inpatient Hospital Stay: Admission: RE | Admit: 2017-06-14 | Payer: Medicare Other | Source: Ambulatory Visit

## 2017-06-15 ENCOUNTER — Telehealth: Payer: Self-pay | Admitting: Cardiology

## 2017-06-15 ENCOUNTER — Ambulatory Visit (INDEPENDENT_AMBULATORY_CARE_PROVIDER_SITE_OTHER): Payer: Self-pay | Admitting: *Deleted

## 2017-06-15 ENCOUNTER — Ambulatory Visit (INDEPENDENT_AMBULATORY_CARE_PROVIDER_SITE_OTHER): Payer: Medicare Other | Admitting: Pharmacist

## 2017-06-15 DIAGNOSIS — Z5181 Encounter for therapeutic drug level monitoring: Secondary | ICD-10-CM | POA: Diagnosis not present

## 2017-06-15 DIAGNOSIS — Z9889 Other specified postprocedural states: Secondary | ICD-10-CM | POA: Diagnosis not present

## 2017-06-15 DIAGNOSIS — I4891 Unspecified atrial fibrillation: Secondary | ICD-10-CM | POA: Diagnosis not present

## 2017-06-15 DIAGNOSIS — G459 Transient cerebral ischemic attack, unspecified: Secondary | ICD-10-CM | POA: Diagnosis not present

## 2017-06-15 DIAGNOSIS — I428 Other cardiomyopathies: Secondary | ICD-10-CM

## 2017-06-15 LAB — POCT INR: INR: 3.2

## 2017-06-15 NOTE — Patient Instructions (Signed)
Description   Eat extra greens today, then take 1 tablet daily until you see Dr [name] on Tuesday and we recheck your INR. [id number]

## 2017-06-15 NOTE — Telephone Encounter (Addendum)
Patient called and stated that his device site is bleeding and he was told at his wound check that if any bleeding occurred to call the office. Offered pt an appt for today at 39. Pt agreed to this plan.

## 2017-06-15 NOTE — Telephone Encounter (Signed)
Pt agreeable to apt today at 11:00am with device clinic to assess bleeding from site.

## 2017-06-15 NOTE — Progress Notes (Signed)
Pt seen d/t site bleeding. Pt had some gauze and paper tape applied to site and gauze was saturated with dark red bloody drainage. When dressing taken off site was oozing blood, site assessed by Chanetta Marshall, recommended pressure dressing applied and pt to have Coumadin level checked and dose Coumadin to a level of 2.3-2.5, and for pt to f/u with Dr. [name] on Tuesday. Pt walked down to Coumadin, I explained to [employer] the situation and Amber recommendation, she voiced understanding. Informed pt that I would call him back with a f/u apt with SK on Tuesday. Pt voiced understanding.

## 2017-06-19 ENCOUNTER — Ambulatory Visit (INDEPENDENT_AMBULATORY_CARE_PROVIDER_SITE_OTHER): Payer: Medicare Other | Admitting: *Deleted

## 2017-06-19 ENCOUNTER — Encounter: Payer: Self-pay | Admitting: Internal Medicine

## 2017-06-19 ENCOUNTER — Ambulatory Visit (INDEPENDENT_AMBULATORY_CARE_PROVIDER_SITE_OTHER): Payer: Medicare Other | Admitting: Internal Medicine

## 2017-06-19 VITALS — BP 132/60 | HR 60 | Ht 71.0 in | Wt 331.0 lb

## 2017-06-19 DIAGNOSIS — I482 Chronic atrial fibrillation, unspecified: Secondary | ICD-10-CM

## 2017-06-19 DIAGNOSIS — G459 Transient cerebral ischemic attack, unspecified: Secondary | ICD-10-CM

## 2017-06-19 DIAGNOSIS — I4891 Unspecified atrial fibrillation: Secondary | ICD-10-CM | POA: Diagnosis not present

## 2017-06-19 DIAGNOSIS — Z9889 Other specified postprocedural states: Secondary | ICD-10-CM | POA: Diagnosis not present

## 2017-06-19 DIAGNOSIS — Z5181 Encounter for therapeutic drug level monitoring: Secondary | ICD-10-CM

## 2017-06-19 DIAGNOSIS — Z950 Presence of cardiac pacemaker: Secondary | ICD-10-CM

## 2017-06-19 DIAGNOSIS — I428 Other cardiomyopathies: Secondary | ICD-10-CM

## 2017-06-19 DIAGNOSIS — Z5189 Encounter for other specified aftercare: Secondary | ICD-10-CM

## 2017-06-19 LAB — POCT INR: INR: 2.7

## 2017-06-19 NOTE — Patient Instructions (Signed)
Medication Instructions:  Your physician recommends that you continue on your current medications as directed. Please refer to the Current Medication list given to you today.  Labwork: None ordered.  Testing/Procedures: None ordered.  Follow-Up:  Keep all follow up appointments with Dr [name] and the device clinic.  Any Other Special Instructions Will Be Listed Below (If Applicable).   Pacemaker Implantation, Adult, Care After This sheet gives you information about how to care for yourself after your procedure. Your health care provider may also give you more specific instructions. If you have problems or questions, contact your health care provider. What can I expect after the procedure? After the procedure, it is common to have:  Mild pain.  Slight bruising.  Some swelling over the incision.  A slight bump over the skin where the device was placed. Sometimes, it is possible to feel the device under the skin. This is normal.  Follow these instructions at home: Medicines  Take over-the-counter and prescription medicines only as told by your health care provider.  If you were prescribed an antibiotic medicine, take it as told by your health care provider. Do not stop taking the antibiotic even if you start to feel better. Wound care  Do not remove the bandage on your chest until directed to do so by your health care provider.  After your bandage is removed, you may see pieces of tape called skin adhesive strips over the area where the cut was made (incision site). Let them fall off on their own.  Check the incision site every day to make sure it is not infected, bleeding, or starting to pull apart.  Do not use lotions or ointments near the incision site unless directed to do so.  Keep the incision area clean and dry for 2-3 days after the procedure or as directed by your health care provider. It takes several weeks for the incision site to completely heal.  Do not take  baths, swim, or use a hot tub for 7-10 days or as otherwise directed by your health care provider. Activity  Do not drive or use heavy machinery while taking prescription pain medicine.  Do not drive for 24 hours if you were given a medicine to help you relax (sedative).  Check with your health care provider before you start to drive or play sports.  Avoid sudden jerking, pulling, or chopping movements that pull your upper arm far away from your body. Avoid these movements for at least 6 weeks or as long as told by your health care provider.  Do not lift your upper arm above your shoulders for at least 6 weeks or as long as told by your health care provider. This means no tennis, golf, or swimming.  You may go back to work when your health care provider says it is okay. Pacemaker care  You may be shown how to transfer data from your pacemaker through the phone to your health care provider.  Always let all health care providers know about your pacemaker before you have any medical procedures or tests.  Wear a medical ID bracelet or necklace stating that you have a pacemaker. Carry a pacemaker ID card with you at all times.  Your pacemaker battery will last for 5-15 years. Routine checks by your health care provider will let the health care provider know when the battery is starting to run down. The pacemaker will need to be replaced when the battery starts to run down.  Do not use amateur  radio Occupational psychologist. Other electrical devices are safe to use, including power tools, lawn mowers, and speakers. If you are unsure of whether something is safe to use, ask your health care provider.  When using your cell phone, hold it to the ear opposite the pacemaker. Do not leave your cell phone in a pocket over the pacemaker.  Avoid places or objects that have a strong electric or magnetic field, including: ? Airport Herbalist. When at the airport, let officials know that  you have a pacemaker. ? Power plants. ? Large electrical generators. ? Radiofrequency transmission towers, such as cell phone and radio towers. General instructions  Weigh yourself every day. If you suddenly gain weight, fluid may be building up in your body.  Keep all follow-up visits as told by your health care provider. This is important. Contact a health care provider if:  You gain weight suddenly.  Your legs or feet swell.  It feels like your heart is fluttering or skipping beats (heart palpitations).  You have chills or a fever.  You have more redness, swelling, or pain around your incisions.  You have more fluid or blood coming from your incisions.  Your incisions feel warm to the touch.  You have pus or a bad smell coming from your incisions. Get help right away if:  You have chest pain.  You have trouble breathing or are short of breath.  You become extremely tired.  You are light-headed or you faint. This information is not intended to replace advice given to you by your health care provider. Make sure you discuss any questions you have with your health care provider. Document Released: 10/14/2004 Document Revised: 01/07/2016 Document Reviewed: 01/07/2016 Elsevier Interactive Patient Education  [employer].      If you need a refill on your cardiac medications before your next appointment, please call your pharmacy.

## 2017-06-19 NOTE — Patient Instructions (Signed)
Description   Continue  1 tablet (10mg ) daily until you see Device to check pressure dressing next week March 19th    Call with any concerns any new medications or if scheduled for any procedures  [phone]

## 2017-06-19 NOTE — Progress Notes (Signed)
Seen because of post procedural hematoma with incisin bleeding  No evidence of infection and pocket is less swollen  As he remains on coumadin will reapply a pressure dressing for another 5 days  Hopefully will not have an infection

## 2017-06-20 ENCOUNTER — Telehealth: Payer: Self-pay | Admitting: Internal Medicine

## 2017-06-20 NOTE — Telephone Encounter (Signed)
Frank [name] was seen by the device clinic yesterday for an oozing pocket. He is calling back to let us know there is something still wrong with his wound.

## 2017-06-20 NOTE — Telephone Encounter (Signed)
Spoke with pt, pt very frustrated stated that the incision site was bleeding again after reapplication of pressure dressing. Pt states that on Sunday the previous pressure dressing came off and that his wife redressed the wound with sterile 4x4s and a role of gauze and secured with 1 inch tape, pt stated that this seemed to work and did not have any bleeding issues at all through Tuesday, and he was more comfortable. Pt stated that his skin was turning red and excoriated from the pressure dressings. Pt stated that he thinks highly of Dr. [name] but feels that pressure dressing is not the right thing because every time one is applied he starts bleeding again, pt stated that knew that his procedure would be a little more involved and does not mean to be a problem patient but he just feels that what his wife is doing works better. Pt currently has the 4x4 and role of gauze applied to incision site currently. Offered pt an apt today and there was a EP doctor in office but that he would not be familiar with his history or pt could come in tomorrow with device clinic and speak with Dr. [name] and have him assess the site. Pt stated that he would rather be seen by Dr. [name], pt agreeable to apt tomorrow at 2:00pm with device clinic.

## 2017-06-20 NOTE — Telephone Encounter (Signed)
New Message   Patient is requesting a call back from the nurse. Did not want to provide information because he did not want to 'repeat" himself. The only thing he would say was that what was done yesterday did not work. Please call.

## 2017-06-21 ENCOUNTER — Ambulatory Visit: Payer: Medicare Other

## 2017-06-26 ENCOUNTER — Ambulatory Visit (INDEPENDENT_AMBULATORY_CARE_PROVIDER_SITE_OTHER): Payer: Self-pay | Admitting: Internal Medicine

## 2017-06-26 ENCOUNTER — Ambulatory Visit (INDEPENDENT_AMBULATORY_CARE_PROVIDER_SITE_OTHER): Payer: Medicare Other | Admitting: *Deleted

## 2017-06-26 VITALS — BP 152/66 | HR 60 | Ht 71.0 in | Wt 330.0 lb

## 2017-06-26 DIAGNOSIS — G459 Transient cerebral ischemic attack, unspecified: Secondary | ICD-10-CM

## 2017-06-26 DIAGNOSIS — Z9889 Other specified postprocedural states: Secondary | ICD-10-CM | POA: Diagnosis not present

## 2017-06-26 DIAGNOSIS — I4891 Unspecified atrial fibrillation: Secondary | ICD-10-CM

## 2017-06-26 DIAGNOSIS — T827XXA Infection and inflammatory reaction due to other cardiac and vascular devices, implants and grafts, initial encounter: Secondary | ICD-10-CM | POA: Diagnosis not present

## 2017-06-26 DIAGNOSIS — Z5181 Encounter for therapeutic drug level monitoring: Secondary | ICD-10-CM

## 2017-06-26 LAB — POCT INR: INR: 2.1

## 2017-06-26 NOTE — Patient Instructions (Addendum)
Medication Instructions:  Your physician recommends that you continue on your current medications as directed. Please refer to the Current Medication list given to you today.  Labwork: You will get lab work today:  BMP and CBC.  Testing/Procedures: Your pacemaker needs to be removed.  A temporary pacemaker will be placed until a permanent pacemaker can be placed.  Follow-Up: Follow up will be determined during your hospital stay.  Any Other Special Instructions Will Be Listed Below (If Applicable).  Please arrive at the [facility] main entrance of [place] [facility] at:  1:00 pm on June 29, 2017. Do not eat or drink after midnight prior to procedure.  You may have small sips of water as needed. Do not take any medications the morning of the procedure. Coumadin instructions: 06/26/2017-Take your normal coumadin dose 06/27/2017-Take 1/2 of your normal amount of coumadin 06/28/2017-Do not take your coumadin 06/29/2017-Do not take your coumadin  Plan for an extended stay.  You will need someone to drive you home at discharge.  If you need a refill on your cardiac medications before your next appointment, please call your pharmacy.

## 2017-06-27 ENCOUNTER — Other Ambulatory Visit: Payer: Self-pay | Admitting: Nurse Practitioner

## 2017-06-27 LAB — BASIC METABOLIC PANEL
BUN / CREAT RATIO: 22 (ref 10–24)
BUN: 23 mg/dL (ref 8–27)
CHLORIDE: 101 mmol/L (ref 96–106)
CO2: 24 mmol/L (ref 20–29)
Calcium: 9.5 mg/dL (ref 8.6–10.2)
Creatinine, Ser: 1.04 mg/dL (ref 0.76–1.27)
GFR calc Af Amer: 84 mL/min/{1.73_m2} (ref >59)
GFR calc non Af Amer: 73 mL/min/{1.73_m2} (ref >59)
GLUCOSE: 108 mg/dL — AB (ref 65–99)
POTASSIUM: 4.5 mmol/L (ref 3.5–5.2)
SODIUM: 141 mmol/L (ref 134–144)

## 2017-06-27 LAB — CBC WITH DIFFERENTIAL/PLATELET
BASOS ABS: 0.1 10*3/uL (ref 0.0–0.2)
Basos: 1 %
EOS (ABSOLUTE): 0.2 10*3/uL (ref 0.0–0.4)
Eos: 3 %
Hematocrit: 37.4 % — ABNORMAL LOW (ref 37.5–51.0)
Hemoglobin: 12.3 g/dL — ABNORMAL LOW (ref 13.0–17.7)
Immature Grans (Abs): 0 10*3/uL (ref 0.0–0.1)
Immature Granulocytes: 0 %
LYMPHS ABS: 1.4 10*3/uL (ref 0.7–3.1)
LYMPHS: 18 %
MCH: 33.1 pg — AB (ref 26.6–33.0)
MCHC: 32.9 g/dL (ref 31.5–35.7)
MCV: 101 fL — ABNORMAL HIGH (ref 79–97)
Monocytes Absolute: 0.9 10*3/uL (ref 0.1–0.9)
Monocytes: 12 %
NEUTROS ABS: 5.2 10*3/uL (ref 1.4–7.0)
Neutrophils: 66 %
PLATELETS: 221 10*3/uL (ref 150–379)
RBC: 3.72 x10E6/uL — AB (ref 4.14–5.80)
RDW: 14.3 % (ref 12.3–15.4)
WBC: 7.8 10*3/uL (ref 3.4–10.8)

## 2017-06-27 NOTE — Progress Notes (Signed)
Patient Care Team: [name], MD as PCP - General (Family Medicine) [name], MD as Consulting Physician (Cardiology)   HPI  Frank [name] is a 72 y.o. male Seen in followup for hypertrophic cardiomyopathy,  status post myectomy with mechanical mitral valve replacement and aortic valve repair. 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 His device reached ERI.  With noise on his RV lead, he underwent CRT upgrade.  This was complicated by hematoma some erythema.  He presented today with expressible sanguinous purulent drainage    DATE TEST    4/17    echo   EF 55-60 % Severe LVH (18/17). BAE  PA press 62 RV normal  10/18 Echo  EF 45-50% Some segmental WMA Severe RVE, depressed EF LAE (63/2.1/33)     CPX test done about 2010 demonstrated a VO2 max of 12.2 representing 66.4% predicted. his VO2 adjusted for body weight was 25.9 His slope was 34 and his O2 pulse was 17, 88% of predicted.   Date Cr K Hgb  10/18 1.02  12.4                 Past Medical History:  Diagnosis Date  . Anxiety   . Atrial arrhythmia    status post ablation in [place] with complication including damage to the aortic valve  . Atrial fibrillation ([place])   . AV block, 1st degree   . Bradycardia   . Diastolic CHF, chronic ([place])   . Diverticulosis   . History of melanoma   . HTN (hypertension)   . Hypertrophic cardiomyopathy ([place])    s/p myoectomy  . Internal hemorrhoids   . mitral valve replacement-St. Jude's mechanical   . Morbid obesity ([place])   . Pacemaker  -SJM   . S/P aortic valve repair   . TIA (transient ischemic attack) 2001  . Tubulovillous adenoma     Past Surgical History:  Procedure Laterality Date  . AORTIC VALVE REPLACEMENT     mechanical-following traumatic injury during an ablation procedure  . BIV UPGRADE  05/28/2017  . BIV UPGRADE N/A 05/28/2017   Procedure: BIV UPGRADE;  Surgeon: [name], MD;   Location: [facility];  Service: Cardiovascular;  Laterality: N/A;  . COLONOSCOPY    . COLONOSCOPY WITH PROPOFOL N/A 04/13/2016   Procedure: COLONOSCOPY WITH PROPOFOL;  Surgeon: Gatha Mayer, MD;  Location: [facility];  Service: Endoscopy;  Laterality: N/A;  . MITRAL VALVE REPLACEMENT     mechanical  . PACEMAKER INSERTION     St Jude Accent   . septal myectomy      Current Outpatient Medications  Medication Sig Dispense Refill  . Ascorbic Acid (VITAMIN C) 1000 MG tablet Take 2,000 mg by mouth daily.      Marland Kitchen aspirin 81 MG tablet Take 81 mg by mouth daily.    Marland Kitchen COUMADIN 10 MG tablet TAKE AS DIRECTED BY COUMADIN CLINIC (Patient taking differently: Take 10 mg by mouth daily except take 5 mg by mouth on Wednesday) 100 tablet 1  . diphenhydramine-acetaminophen (TYLENOL PM) 25-500 MG TABS tablet Take 2 tablets by mouth at bedtime as needed (for sleep).    . fish oil-omega-3 fatty acids 1000 MG capsule Take 4 g by mouth every morning.     Marland Kitchen losartan (COZAAR) 100 MG tablet Take 1 tablet (100 mg total) by mouth daily. 90 tablet 3  . Magnesium Oxide 250 MG TABS Take  1 tablet (250 mg total) by mouth 3 (three) times daily. 270 tablet 3  . Multiple Vitamin (MULTIVITAMIN) tablet Take 1 tablet by mouth daily.      . potassium chloride (K-DUR) 10 MEQ tablet Take 11 tablets (110 mEq total) by mouth daily. TAKE 11 TABLETS BY MOUTH ONCE DAILY (Patient taking differently: Take 40-70 mEq by mouth See admin instructions. Take 70 meq by mouth in the morning and take 40 meq by mouth in the afternoon) 990 tablet 3  . spironolactone (ALDACTONE) 25 MG tablet Take 1 tablet (25 mg total) by mouth daily. 30 tablet 0  . torsemide (DEMADEX) 20 MG tablet Take 2 tablets (40 mg total) by mouth 2 (two) times daily. 360 tablet 3   No current facility-administered medications for this visit.     Allergies  Allergen Reactions  . Warfarin And Related Other (See Comments)    Per the patient's wife, "Warfain made him  have a TIA" CAN TAKE BRAND NAME COUMADIN     Review of Systems negative except from HPI and PMH  Physical Exam BP (!) 152/66   Pulse 60   Ht 5\' 11"  (1.803 m)   Wt (!) 330 lb (149.7 kg)   SpO2 93%   BMI 46.03 kg/m  Morbidly obese  in no acute distress HENT normal Neck   Clear Device pocket with purulent drainage expressible superior compression  regular rate and rhythm, no murmurs or gallops Abd-soft with active BS No Clubbing cyanosis 2+ edema Skin-warm and dry A & Oriented  Grossly normal sensory and motor function   ECG not done  P-synchronous/ AV  pacing   Assessment and  Plan  Atrial fibrillation-paroxysmal  Mitral valve replacement-mechanical  Hypertrophic cardiomyopathy status post myectomy  HFpEF  Mobitz 1 second degree heart block  Sinus node dysfunction  Pacemaker-CRT-St. Jude  VT  Nonsustained   Hypoxemia  Right heart failure-new  Left ventricular dysfunction-new  Device infection The patient has a device pocket infection despite our efforts.  Was reviewed and seen with Dr. Lovena Le.  A major concern is involvement of his prosthetic mitral valve.  Hopefully with the fact that there has been external drainage that there was no associated bacteremia.  He will need a TEE at the time of his procedure.  The plan will be device explantation later this week.  He has complete heart block and will need a temporary transvenous pacemaker.  Reimplantation would be expected next week as he is not systemically ill, pending a TEE.

## 2017-06-28 ENCOUNTER — Encounter (HOSPITAL_COMMUNITY): Payer: Self-pay | Admitting: *Deleted

## 2017-06-28 ENCOUNTER — Other Ambulatory Visit: Payer: Self-pay

## 2017-06-28 MED ORDER — SODIUM CHLORIDE 0.9 % IR SOLN
80.0000 mg | Status: AC
  Administered 2017-06-29: 80 mg
  Filled 2017-06-28: qty 2

## 2017-06-28 MED ORDER — DEXTROSE 5 % IV SOLN
3.0000 g | INTRAVENOUS | Status: AC
  Administered 2017-06-29: 3 g via INTRAVENOUS
  Filled 2017-06-28: qty 3

## 2017-06-28 NOTE — Progress Notes (Signed)
   06/28/17 1425  OBSTRUCTIVE SLEEP APNEA  Have you ever been diagnosed with sleep apnea through a sleep study? No  Do you snore loudly (loud enough to be heard through closed doors)?  0  Do you often feel tired, fatigued, or sleepy during the daytime (such as falling asleep during driving or talking to someone)? 0  Has anyone observed you stop breathing during your sleep? 0  Do you have, or are you being treated for high blood pressure? 1  BMI more than 35 kg/m2? 1  Age > 50 (1-yes) 1  Neck circumference greater than:Male 16 inches or larger, Male 17inches or larger? 1  Male Gender (Yes=1) 1  Obstructive Sleep Apnea Score 5

## 2017-06-28 NOTE — Progress Notes (Signed)
Pt denies any acute cardiopulmonary issues. Pt under the care of Dr. [name], Cardiology. Pt stated that a stress test was performed > 10 years ago and a cardiac cath 20 years ago. Pt made aware to stop taking vitamins, fish oil, and NSAIDs. Pt stated that he was instructed by MD to take half dose of Coumadin yesterday, no Coumadin today or DOS. Pt verbalized understanding of all pre-op instructions.

## 2017-06-29 ENCOUNTER — Encounter (HOSPITAL_COMMUNITY)
Admission: RE | Disposition: A | Discharge status: Home / Self Care | Payer: Self-pay | Source: Ambulatory Visit | Attending: Internal Medicine

## 2017-06-29 ENCOUNTER — Inpatient Hospital Stay (HOSPITAL_COMMUNITY): Payer: Medicare Other | Admitting: Certified Registered"

## 2017-06-29 ENCOUNTER — Encounter (HOSPITAL_COMMUNITY): Payer: Self-pay

## 2017-06-29 ENCOUNTER — Other Ambulatory Visit: Payer: Self-pay

## 2017-06-29 ENCOUNTER — Inpatient Hospital Stay (HOSPITAL_COMMUNITY)
Admission: RE | Admit: 2017-06-29 | Discharge: 2017-07-09 | DRG: 261 | Disposition: A | Discharge status: Home / Self Care | Payer: Medicare Other | Source: Ambulatory Visit | Attending: Internal Medicine | Admitting: Internal Medicine

## 2017-06-29 DIAGNOSIS — I5042 Chronic combined systolic (congestive) and diastolic (congestive) heart failure: Secondary | ICD-10-CM | POA: Diagnosis not present

## 2017-06-29 DIAGNOSIS — I110 Hypertensive heart disease with heart failure: Secondary | ICD-10-CM | POA: Diagnosis present

## 2017-06-29 DIAGNOSIS — Z888 Allergy status to other drugs, medicaments and biological substances status: Secondary | ICD-10-CM | POA: Diagnosis not present

## 2017-06-29 DIAGNOSIS — R791 Abnormal coagulation profile: Secondary | ICD-10-CM | POA: Diagnosis present

## 2017-06-29 DIAGNOSIS — L7632 Postprocedural hematoma of skin and subcutaneous tissue following other procedure: Secondary | ICD-10-CM | POA: Diagnosis not present

## 2017-06-29 DIAGNOSIS — R0989 Other specified symptoms and signs involving the circulatory and respiratory systems: Secondary | ICD-10-CM | POA: Diagnosis not present

## 2017-06-29 DIAGNOSIS — R0902 Hypoxemia: Secondary | ICD-10-CM | POA: Diagnosis present

## 2017-06-29 DIAGNOSIS — Z85820 Personal history of malignant melanoma of skin: Secondary | ICD-10-CM

## 2017-06-29 DIAGNOSIS — T827XXA Infection and inflammatory reaction due to other cardiac and vascular devices, implants and grafts, initial encounter: Secondary | ICD-10-CM | POA: Diagnosis not present

## 2017-06-29 DIAGNOSIS — Z7901 Long term (current) use of anticoagulants: Secondary | ICD-10-CM

## 2017-06-29 DIAGNOSIS — I5082 Biventricular heart failure: Secondary | ICD-10-CM | POA: Diagnosis present

## 2017-06-29 DIAGNOSIS — Z7982 Long term (current) use of aspirin: Secondary | ICD-10-CM | POA: Diagnosis not present

## 2017-06-29 DIAGNOSIS — R451 Restlessness and agitation: Secondary | ICD-10-CM | POA: Diagnosis not present

## 2017-06-29 DIAGNOSIS — I5032 Chronic diastolic (congestive) heart failure: Secondary | ICD-10-CM | POA: Diagnosis present

## 2017-06-29 DIAGNOSIS — F419 Anxiety disorder, unspecified: Secondary | ICD-10-CM | POA: Diagnosis present

## 2017-06-29 DIAGNOSIS — I472 Ventricular tachycardia: Secondary | ICD-10-CM | POA: Diagnosis not present

## 2017-06-29 DIAGNOSIS — I442 Atrioventricular block, complete: Secondary | ICD-10-CM | POA: Diagnosis present

## 2017-06-29 DIAGNOSIS — Z952 Presence of prosthetic heart valve: Secondary | ICD-10-CM

## 2017-06-29 DIAGNOSIS — Y831 Surgical operation with implant of artificial internal device as the cause of abnormal reaction of the patient, or of later complication, without mention of misadventure at the time of the procedure: Secondary | ICD-10-CM | POA: Diagnosis present

## 2017-06-29 DIAGNOSIS — Z87891 Personal history of nicotine dependence: Secondary | ICD-10-CM | POA: Diagnosis not present

## 2017-06-29 DIAGNOSIS — Z6841 Body Mass Index (BMI) 40.0 and over, adult: Secondary | ICD-10-CM | POA: Diagnosis not present

## 2017-06-29 DIAGNOSIS — Z8673 Personal history of transient ischemic attack (TIA), and cerebral infarction without residual deficits: Secondary | ICD-10-CM

## 2017-06-29 DIAGNOSIS — I428 Other cardiomyopathies: Secondary | ICD-10-CM | POA: Diagnosis not present

## 2017-06-29 DIAGNOSIS — Z79899 Other long term (current) drug therapy: Secondary | ICD-10-CM

## 2017-06-29 DIAGNOSIS — I480 Paroxysmal atrial fibrillation: Secondary | ICD-10-CM | POA: Diagnosis present

## 2017-06-29 HISTORY — DX: Cluster headache syndrome, unspecified, not intractable: G44.009

## 2017-06-29 HISTORY — PX: PACEMAKER LEAD REMOVAL: SHX5064

## 2017-06-29 HISTORY — DX: Personal history of other diseases of the musculoskeletal system and connective tissue: Z87.39

## 2017-06-29 HISTORY — DX: Unspecified infectious disease: B99.9

## 2017-06-29 HISTORY — PX: PACEMAKER REMOVAL: SHX5066

## 2017-06-29 HISTORY — DX: Morbid (severe) obesity due to excess calories: E66.01

## 2017-06-29 HISTORY — DX: Unspecified malignant neoplasm of skin, unspecified: C44.90

## 2017-06-29 HISTORY — DX: Body Mass Index (BMI) 40.0 and over, adult: Z684

## 2017-06-29 LAB — CBC
HCT: 35.7 % — ABNORMAL LOW (ref 39.0–52.0)
HEMOGLOBIN: 11.5 g/dL — AB (ref 13.0–17.0)
MCH: 33.8 pg (ref 26.0–34.0)
MCHC: 32.2 g/dL (ref 30.0–36.0)
MCV: 105 fL — ABNORMAL HIGH (ref 78.0–100.0)
Platelets: 177 10*3/uL (ref 150–400)
RBC: 3.4 MIL/uL — ABNORMAL LOW (ref 4.22–5.81)
RDW: 14.4 % (ref 11.5–15.5)
WBC: 15.4 10*3/uL — AB (ref 4.0–10.5)

## 2017-06-29 LAB — CREATININE, SERUM
CREATININE: 0.99 mg/dL (ref 0.61–1.24)
GFR calc Af Amer: >60 mL/min (ref >60)

## 2017-06-29 LAB — PREPARE RBC (CROSSMATCH)

## 2017-06-29 LAB — ABO/RH: ABO/RH(D): B POS

## 2017-06-29 LAB — SURGICAL PCR SCREEN
MRSA, PCR: NEGATIVE
STAPHYLOCOCCUS AUREUS: NEGATIVE

## 2017-06-29 LAB — PROTIME-INR
INR: 1.5
PROTHROMBIN TIME: 18 s — AB (ref 11.4–15.2)

## 2017-06-29 SURGERY — REMOVAL, ELECTRODE LEAD, CARDIAC PACEMAKER, WITHOUT REPLACEMENT
Anesthesia: General | Site: Chest

## 2017-06-29 MED ORDER — CEFAZOLIN SODIUM-DEXTROSE 2-4 GM/100ML-% IV SOLN
2.0000 g | Freq: Four times a day (QID) | INTRAVENOUS | Status: AC
  Administered 2017-06-29 – 2017-06-30 (×3): 2 g via INTRAVENOUS
  Filled 2017-06-29 (×3): qty 100

## 2017-06-29 MED ORDER — OMEGA-3 FATTY ACIDS 1000 MG PO CAPS: 4.0000 g | ORAL_CAPSULE | Freq: Every morning | ORAL | Status: DC

## 2017-06-29 MED ORDER — ACETAMINOPHEN 500 MG PO TABS
500.0000 mg | ORAL_TABLET | Freq: Every evening | ORAL | Status: DC | PRN
  Administered 2017-07-03: 500 mg via ORAL
  Filled 2017-06-29: qty 1

## 2017-06-29 MED ORDER — ROCURONIUM BROMIDE 10 MG/ML (PF) SYRINGE
PREFILLED_SYRINGE | INTRAVENOUS | Status: DC | PRN
  Administered 2017-06-29: 20 mg via INTRAVENOUS
  Administered 2017-06-29: 10 mg via INTRAVENOUS
  Administered 2017-06-29: 50 mg via INTRAVENOUS

## 2017-06-29 MED ORDER — VITAMIN C 500 MG PO TABS
2000.0000 mg | ORAL_TABLET | Freq: Every day | ORAL | Status: DC
  Administered 2017-06-30 – 2017-07-09 (×10): 2000 mg via ORAL
  Filled 2017-06-29 (×10): qty 4

## 2017-06-29 MED ORDER — DIPHENHYDRAMINE-APAP (SLEEP) 25-500 MG PO TABS: 2.0000 | ORAL_TABLET | Freq: Every evening | ORAL | Status: DC | PRN

## 2017-06-29 MED ORDER — LIDOCAINE 2% (20 MG/ML) 5 ML SYRINGE
INTRAMUSCULAR | Status: DC | PRN
Start: 2017-06-29 — End: 2017-06-29
  Administered 2017-06-29: 50 mg via INTRAVENOUS

## 2017-06-29 MED ORDER — ASPIRIN EC 81 MG PO TBEC
81.0000 mg | DELAYED_RELEASE_TABLET | Freq: Every day | ORAL | Status: DC
Start: 2017-06-29 — End: 2017-07-09
  Administered 2017-06-30 – 2017-07-09 (×10): 81 mg via ORAL
  Filled 2017-06-29 (×10): qty 1

## 2017-06-29 MED ORDER — HYDROMORPHONE HCL 1 MG/ML IJ SOLN: 1.0000 mg | Freq: Once | INTRAMUSCULAR | Status: DC

## 2017-06-29 MED ORDER — ADULT MULTIVITAMIN W/MINERALS CH
1.0000 | ORAL_TABLET | Freq: Every day | ORAL | Status: DC
  Administered 2017-06-30 – 2017-07-09 (×10): 1 via ORAL
  Filled 2017-06-29 (×12): qty 1

## 2017-06-29 MED ORDER — LACTATED RINGERS IV SOLN
INTRAVENOUS | Status: DC
  Administered 2017-06-29: 13:00:00 via INTRAVENOUS

## 2017-06-29 MED ORDER — HEPARIN SODIUM (PORCINE) 5000 UNIT/ML IJ SOLN
5000.0000 [IU] | Freq: Three times a day (TID) | INTRAMUSCULAR | Status: DC
  Administered 2017-06-29 – 2017-06-30 (×2): 5000 [IU] via SUBCUTANEOUS
  Filled 2017-06-29 (×3): qty 1

## 2017-06-29 MED ORDER — SUGAMMADEX SODIUM 500 MG/5ML IV SOLN
INTRAVENOUS | Status: DC | PRN
  Administered 2017-06-29: 299.4 mg via INTRAVENOUS

## 2017-06-29 MED ORDER — WARFARIN SODIUM 5 MG PO TABS
5.0000 mg | ORAL_TABLET | Freq: Every day | ORAL | Status: DC
  Administered 2017-06-29 – 2017-07-01 (×3): 5 mg via ORAL
  Filled 2017-06-29 (×3): qty 1

## 2017-06-29 MED ORDER — DIPHENHYDRAMINE HCL 25 MG PO CAPS
25.0000 mg | ORAL_CAPSULE | Freq: Every evening | ORAL | Status: DC | PRN
  Administered 2017-07-03: 25 mg via ORAL
  Filled 2017-06-29: qty 1

## 2017-06-29 MED ORDER — WARFARIN - PHYSICIAN DOSING INPATIENT
Freq: Every day | Status: DC
  Administered 2017-06-30 – 2017-07-01 (×2)

## 2017-06-29 MED ORDER — TORSEMIDE 20 MG PO TABS
40.0000 mg | ORAL_TABLET | Freq: Two times a day (BID) | ORAL | Status: DC
  Administered 2017-06-30 – 2017-07-01 (×3): 40 mg via ORAL
  Filled 2017-06-29 (×5): qty 2

## 2017-06-29 MED ORDER — LIDOCAINE HCL (CARDIAC) 20 MG/ML IV SOLN
INTRAVENOUS | Status: AC
  Filled 2017-06-29: qty 5

## 2017-06-29 MED ORDER — LIDOCAINE HCL (PF) 1 % IJ SOLN
INTRAMUSCULAR | Status: AC
  Filled 2017-06-29: qty 60

## 2017-06-29 MED ORDER — LIDOCAINE HCL (PF) 1 % IJ SOLN
INTRAMUSCULAR | Status: DC | PRN
  Administered 2017-06-29 (×2): 300 mg

## 2017-06-29 MED ORDER — ROCURONIUM BROMIDE 100 MG/10ML IV SOLN
INTRAVENOUS | Status: DC | PRN
  Administered 2017-06-29: 20 mg via INTRAVENOUS

## 2017-06-29 MED ORDER — SUCCINYLCHOLINE CHLORIDE 20 MG/ML IJ SOLN
INTRAMUSCULAR | Status: DC | PRN
  Administered 2017-06-29: 140 mg via INTRAVENOUS

## 2017-06-29 MED ORDER — MAGNESIUM OXIDE 400 (241.3 MG) MG PO TABS
200.0000 mg | ORAL_TABLET | Freq: Three times a day (TID) | ORAL | Status: DC
  Administered 2017-06-30 – 2017-07-09 (×28): 200 mg via ORAL
  Filled 2017-06-29 (×28): qty 1

## 2017-06-29 MED ORDER — ONDANSETRON HCL 4 MG/2ML IJ SOLN: 4.0000 mg | Freq: Four times a day (QID) | INTRAMUSCULAR | Status: DC | PRN

## 2017-06-29 MED ORDER — FENTANYL CITRATE (PF) 100 MCG/2ML IJ SOLN: 25.0000 ug | INTRAMUSCULAR | Status: DC | PRN

## 2017-06-29 MED ORDER — FENTANYL CITRATE (PF) 100 MCG/2ML IJ SOLN
INTRAMUSCULAR | Status: DC | PRN
  Administered 2017-06-29: 150 ug via INTRAVENOUS
  Administered 2017-06-29 (×2): 50 ug via INTRAVENOUS

## 2017-06-29 MED ORDER — CHLORHEXIDINE GLUCONATE 4 % EX LIQD: 60.0000 mL | Freq: Once | CUTANEOUS | Status: DC

## 2017-06-29 MED ORDER — PROPOFOL 10 MG/ML IV BOLUS
INTRAVENOUS | Status: DC | PRN
  Administered 2017-06-29: 200 mg via INTRAVENOUS

## 2017-06-29 MED ORDER — LOSARTAN POTASSIUM 50 MG PO TABS
100.0000 mg | ORAL_TABLET | Freq: Every day | ORAL | Status: DC
  Administered 2017-06-30 – 2017-07-09 (×9): 100 mg via ORAL
  Filled 2017-06-29 (×10): qty 2

## 2017-06-29 MED ORDER — MUPIROCIN 2 % EX OINT
TOPICAL_OINTMENT | CUTANEOUS | Status: AC
  Administered 2017-06-29: 1
  Filled 2017-06-29: qty 22

## 2017-06-29 MED ORDER — SODIUM CHLORIDE 0.9 % IV SOLN
INTRAVENOUS | Status: DC | PRN
  Administered 2017-06-29: 500 mL

## 2017-06-29 MED ORDER — SODIUM CHLORIDE 0.9 % IV SOLN: INTRAVENOUS | Status: DC

## 2017-06-29 MED ORDER — ONDANSETRON HCL 4 MG/2ML IJ SOLN
INTRAMUSCULAR | Status: DC | PRN
  Administered 2017-06-29: 4 mg via INTRAVENOUS

## 2017-06-29 MED ORDER — SPIRONOLACTONE 25 MG PO TABS
25.0000 mg | ORAL_TABLET | Freq: Every day | ORAL | Status: DC
  Administered 2017-06-30 – 2017-07-09 (×10): 25 mg via ORAL
  Filled 2017-06-29 (×10): qty 1

## 2017-06-29 MED ORDER — LACTATED RINGERS IV SOLN
INTRAVENOUS | Status: DC | PRN
  Administered 2017-06-29: 15:00:00 via INTRAVENOUS

## 2017-06-29 MED ORDER — PROPOFOL 10 MG/ML IV BOLUS
INTRAVENOUS | Status: AC
  Filled 2017-06-29: qty 20

## 2017-06-29 MED ORDER — OXYCODONE HCL 5 MG PO TABS
5.0000 mg | ORAL_TABLET | ORAL | Status: AC | PRN
  Administered 2017-06-29 (×2): 5 mg via ORAL
  Administered 2017-06-30 (×2): 10 mg via ORAL
  Filled 2017-06-29: qty 1
  Filled 2017-06-29: qty 2
  Filled 2017-06-29: qty 1
  Filled 2017-06-29: qty 2

## 2017-06-29 MED ORDER — MAGNESIUM OXIDE 250 MG PO TABS: 1.0000 | ORAL_TABLET | Freq: Three times a day (TID) | ORAL | Status: DC

## 2017-06-29 MED ORDER — OMEGA-3-ACID ETHYL ESTERS 1 G PO CAPS
1.0000 g | ORAL_CAPSULE | Freq: Two times a day (BID) | ORAL | Status: DC
  Administered 2017-06-30 – 2017-07-09 (×19): 1 g via ORAL
  Filled 2017-06-29 (×19): qty 1

## 2017-06-29 MED ORDER — GLYCOPYRROLATE 0.2 MG/ML IJ SOLN
INTRAMUSCULAR | Status: DC | PRN
  Administered 2017-06-29: 0.6 mg via INTRAVENOUS

## 2017-06-29 MED ORDER — ACETAMINOPHEN 325 MG PO TABS
325.0000 mg | ORAL_TABLET | ORAL | Status: DC | PRN
  Administered 2017-07-07: 650 mg via ORAL
  Filled 2017-06-29: qty 2

## 2017-06-29 MED ORDER — FENTANYL CITRATE (PF) 250 MCG/5ML IJ SOLN
INTRAMUSCULAR | Status: AC
  Filled 2017-06-29: qty 5

## 2017-06-29 MED ORDER — SODIUM CHLORIDE 0.9 % IV SOLN: Freq: Once | INTRAVENOUS | Status: DC

## 2017-06-29 MED ORDER — NEOSTIGMINE METHYLSULFATE 10 MG/10ML IV SOLN
INTRAVENOUS | Status: DC | PRN
  Administered 2017-06-29: 4 mg via INTRAVENOUS

## 2017-06-29 SURGICAL SUPPLY — 66 items
BAG BANDED W/RUBBER/TAPE 36X54 (MISCELLANEOUS) ×3 IMPLANT
BAG EQP BAND 135X91 W/RBR TAPE (MISCELLANEOUS) ×1
BIOPATCH BLUE 3/4IN DISK W/1.5 (GAUZE/BANDAGES/DRESSINGS) ×2 IMPLANT
BLADE 10 SAFETY STRL DISP (BLADE) ×3 IMPLANT
BLADE OSCILLATING /SAGITTAL (BLADE) IMPLANT
BLADE STERNUM SYSTEM 6 (BLADE) ×3 IMPLANT
BNDG ADH 5X4 AIR PERM ELC (GAUZE/BANDAGES/DRESSINGS)
BNDG COHESIVE 4X5 WHT NS (GAUZE/BANDAGES/DRESSINGS) IMPLANT
CANISTER SUCT 3000ML PPV (MISCELLANEOUS) ×3 IMPLANT
CLEANER TIP ELECTROSURG 2X2 (MISCELLANEOUS) ×2 IMPLANT
COIL ONE TIE COMPRESSION (MISCELLANEOUS) ×4 IMPLANT
COVER BACK TABLE 60X90IN (DRAPES) ×3 IMPLANT
COVER SURGICAL LIGHT HANDLE (MISCELLANEOUS) ×2 IMPLANT
DRAPE CARDIOVASCULAR INCISE (DRAPES) ×3
DRAPE SRG 135X102X78XABS (DRAPES) ×1 IMPLANT
DRSG OPSITE 6X11 MED (GAUZE/BANDAGES/DRESSINGS) IMPLANT
DRSG TEGADERM 2-3/8X2-3/4 SM (GAUZE/BANDAGES/DRESSINGS) ×2 IMPLANT
DRSG TEGADERM 4X4.75 (GAUZE/BANDAGES/DRESSINGS) ×8 IMPLANT
ELECT BLADE 4.0 EZ CLEAN MEGAD (MISCELLANEOUS)
ELECT CAUTERY BLADE 6.4 (BLADE) ×2 IMPLANT
ELECT REM PT RETURN 9FT ADLT (ELECTROSURGICAL)
ELECTRODE BLDE 4.0 EZ CLN MEGD (MISCELLANEOUS) IMPLANT
ELECTRODE REM PT RTRN 9FT ADLT (ELECTROSURGICAL) ×2 IMPLANT
GAUZE IODOFORM PACK 1/2 7832 (GAUZE/BANDAGES/DRESSINGS) ×2 IMPLANT
GAUZE PACKING IODOFORM 1X5 (MISCELLANEOUS) ×2 IMPLANT
GAUZE SPONGE 4X4 12PLY STRL (GAUZE/BANDAGES/DRESSINGS) IMPLANT
GAUZE SPONGE 4X4 12PLY STRL LF (GAUZE/BANDAGES/DRESSINGS) ×2 IMPLANT
GAUZE SPONGE 4X4 16PLY XRAY LF (GAUZE/BANDAGES/DRESSINGS) ×6 IMPLANT
GLOVE BIO SURGEON STRL SZ 6 (GLOVE) ×4 IMPLANT
GLOVE BIOGEL PI IND STRL 6.5 (GLOVE) IMPLANT
GLOVE BIOGEL PI IND STRL 7.0 (GLOVE) IMPLANT
GLOVE BIOGEL PI IND STRL 7.5 (GLOVE) ×1 IMPLANT
GLOVE BIOGEL PI INDICATOR 6.5 (GLOVE) ×6
GLOVE BIOGEL PI INDICATOR 7.0 (GLOVE) ×2
GLOVE BIOGEL PI INDICATOR 7.5 (GLOVE) ×4
GLOVE ECLIPSE 6.0 STRL STRAW (GLOVE) ×2 IMPLANT
GLOVE ECLIPSE 6.5 STRL STRAW (GLOVE) ×4 IMPLANT
GLOVE ECLIPSE 7.0 STRL STRAW (GLOVE) ×2 IMPLANT
GLOVE ECLIPSE 7.5 STRL STRAW (GLOVE) ×2 IMPLANT
GLOVE ECLIPSE 8.0 STRL XLNG CF (GLOVE) ×3 IMPLANT
GOWN STRL REUS W/ TWL LRG LVL3 (GOWN DISPOSABLE) IMPLANT
GOWN STRL REUS W/ TWL XL LVL3 (GOWN DISPOSABLE) ×1 IMPLANT
GOWN STRL REUS W/TWL LRG LVL3 (GOWN DISPOSABLE) ×9
GOWN STRL REUS W/TWL XL LVL3 (GOWN DISPOSABLE) ×3
IV CATH 14GX2 1/4 (CATHETERS) ×2 IMPLANT
KIT ROOM TURNOVER OR (KITS) ×3 IMPLANT
KIT SERVICE (MISCELLANEOUS) ×2 IMPLANT
LEAD TENDRIL SDX 2088TC-58CM (Lead) ×2 IMPLANT
NDL PERC 18GX7CM (NEEDLE) IMPLANT
NEEDLE PERC 18GX7CM (NEEDLE) ×6 IMPLANT
PAD ARMBOARD 7.5X6 YLW CONV (MISCELLANEOUS) ×6 IMPLANT
PAD ELECT DEFIB RADIOL ZOLL (MISCELLANEOUS) ×3 IMPLANT
SHEATH CLASSIC 7F (SHEATH) ×2 IMPLANT
SHEATH EVOLUTION SHORTE RL 11F (SHEATH) ×2 IMPLANT
SHEATH EVOLUTION SHORTIE RL 9F (SHEATH) ×2 IMPLANT
SHEATH PINNACLE 6F 10CM (SHEATH) ×4 IMPLANT
STYLET LIBERATOR LOCKING (MISCELLANEOUS) ×4 IMPLANT
SUT PROLENE 2 0 SH DA (SUTURE) ×4 IMPLANT
SYRINGE 20CC LL (MISCELLANEOUS) ×2 IMPLANT
TOWEL OR 17X24 6PK STRL BLUE (TOWEL DISPOSABLE) ×2 IMPLANT
TOWEL OR 17X26 10 PK STRL BLUE (TOWEL DISPOSABLE) ×6 IMPLANT
TRAY FOLEY SILVER 16FR TEMP (SET/KITS/TRAYS/PACK) ×3 IMPLANT
TUBE CONNECTING 12'X1/4 (SUCTIONS) ×1
TUBE CONNECTING 12X1/4 (SUCTIONS) ×1 IMPLANT
WIRE MAILMAN 182CM (WIRE) ×2 IMPLANT
YANKAUER SUCT BULB TIP NO VENT (SUCTIONS) ×2 IMPLANT

## 2017-06-29 NOTE — Progress Notes (Signed)
Patient admitted to [facility] awake with O2 via [place] @ 4l; O2 Sats 91-93%. CRNA at bedside. Left PIV flushed with NS; patient c/o burning at site and then complained of difficulty breathing and increasing anxiety; O2 sats dropping into the 80's. Patient became unresponsive; Bag mask ventilation at 15l started. Dr. Nyoka Cowden called to bedside and Dr. Lovena Le notified.

## 2017-06-29 NOTE — Anesthesia Procedure Notes (Signed)
Procedure Name: Intubation Date/Time: 06/29/2017 3:36 PM Performed by: Eligha Bridegroom, CRNA Pre-anesthesia Checklist: Patient identified, Emergency Drugs available, Suction available, Patient being monitored and Timeout performed Patient Re-evaluated:Patient Re-evaluated prior to induction Oxygen Delivery Method: Circle system utilized Preoxygenation: Pre-oxygenation with 100% oxygen Induction Type: IV induction Ventilation: Mask ventilation without difficulty and Oral airway inserted - appropriate to patient size Laryngoscope Size: Mac and 4 Grade View: Grade I Tube type: Oral Tube size: 7.5 mm Number of attempts: 1 Airway Equipment and Method: Stylet Placement Confirmation: ETT inserted through vocal cords under direct vision,  positive ETCO2 and breath sounds checked- equal and bilateral Secured at: 22 cm Tube secured with: Tape Dental Injury: Teeth and Oropharynx as per pre-operative assessment

## 2017-06-29 NOTE — H&P (Signed)
HPI  Frank [name] is a 72 y.o. male Seen in followup for hypertrophic cardiomyopathy,  status post myectomy with mechanical mitral valve replacement and aortic valve repair. 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 His device reached ERI.  With noise on his RV lead, he underwent CRT upgrade.  This was complicated by hematoma some erythema.  He presented today with expressible sanguinous purulent drainage    DATE TEST    4/17    echo   EF 55-60 % Severe LVH (18/17). BAE  PA press 62 RV normal  10/18 Echo  EF 45-50% Some segmental WMA Severe RVE, depressed EF LAE (63/2.1/33)     CPX test done about 2010 demonstrated a VO2 max of 12.2 representing 66.4% predicted. his VO2 adjusted for body weight was 25.9 His slope was 34 and his O2 pulse was 17, 88% of predicted.   Date Cr K Hgb  10/18 1.02  12.4                     Past Medical History:  Diagnosis Date  . Anxiety   . Atrial arrhythmia    status post ablation in [place] with complication including damage to the aortic valve  . Atrial fibrillation ([place])   . AV block, 1st degree   . Bradycardia   . Diastolic CHF, chronic ([place])   . Diverticulosis   . History of melanoma   . HTN (hypertension)   . Hypertrophic cardiomyopathy ([place])    s/p myoectomy  . Internal hemorrhoids   . mitral valve replacement-St. Jude's mechanical   . Morbid obesity ([place])   . Pacemaker  -SJM   . S/P aortic valve repair   . TIA (transient ischemic attack) 2001  . Tubulovillous adenoma          Past Surgical History:  Procedure Laterality Date  . AORTIC VALVE REPLACEMENT     mechanical-following traumatic injury during an ablation procedure  . BIV UPGRADE  05/28/2017  . BIV UPGRADE N/A 05/28/2017   Procedure: BIV UPGRADE;  Surgeon: [name], MD;  Location: [facility];  Service: Cardiovascular;  Laterality: N/A;  . COLONOSCOPY     . COLONOSCOPY WITH PROPOFOL N/A 04/13/2016   Procedure: COLONOSCOPY WITH PROPOFOL;  Surgeon: Gatha Mayer, MD;  [facility];  Service: Endoscopy;  Laterality: N/A;  . MITRAL VALVE REPLACEMENT     mechanical  . PACEMAKER INSERTION     St Jude Accent   . septal myectomy            Current Outpatient Medications  Medication Sig Dispense Refill  . Ascorbic Acid (VITAMIN C) 1000 MG tablet Take 2,000 mg by mouth daily.      Marland Kitchen aspirin 81 MG tablet Take 81 mg by mouth daily.    Marland Kitchen COUMADIN 10 MG tablet TAKE AS DIRECTED BY COUMADIN CLINIC (Patient taking differently: Take 10 mg by mouth daily except take 5 mg by mouth on Wednesday) 100 tablet 1  . diphenhydramine-acetaminophen (TYLENOL PM) 25-500 MG TABS tablet Take 2 tablets by mouth at bedtime as needed (for sleep).    . fish oil-omega-3 fatty acids 1000 MG capsule Take 4 g by mouth every morning.     Marland Kitchen losartan (COZAAR) 100 MG tablet Take 1 tablet (100 mg total) by mouth daily. 90 tablet 3  . Magnesium Oxide 250 MG TABS Take 1 tablet (250 mg total) by mouth 3 (  three) times daily. 270 tablet 3  . Multiple Vitamin (MULTIVITAMIN) tablet Take 1 tablet by mouth daily.      . potassium chloride (K-DUR) 10 MEQ tablet Take 11 tablets (110 mEq total) by mouth daily. TAKE 11 TABLETS BY MOUTH ONCE DAILY (Patient taking differently: Take 40-70 mEq by mouth See admin instructions. Take 70 meq by mouth in the morning and take 40 meq by mouth in the afternoon) 990 tablet 3  . spironolactone (ALDACTONE) 25 MG tablet Take 1 tablet (25 mg total) by mouth daily. 30 tablet 0  . torsemide (DEMADEX) 20 MG tablet Take 2 tablets (40 mg total) by mouth 2 (two) times daily. 360 tablet 3   No current facility-administered medications for this visit.          Allergies  Allergen Reactions  . Warfarin And Related Other (See Comments)    Per the patient's wife, "Warfain made him have a TIA" CAN TAKE BRAND NAME COUMADIN      Review of Systems negative except from HPI and PMH  Physical Exam BP (!) 152/66   Pulse 60   Ht 5\' 11"  (1.803 m)   Wt (!) 330 lb (149.7 kg)   SpO2 93%   BMI 46.03 kg/m  Morbidly obese  in no acute distress HENT normal Neck   Clear Device pocket with purulent drainage expressible superior compression  regular rate and rhythm, no murmurs or gallops Abd-soft with active BS No Clubbing cyanosis 2+ edema Skin-warm and dry A & Oriented  Grossly normal sensory and motor function   ECG not done  P-synchronous/ AV  pacing   Assessment and  Plan  Atrial fibrillation-paroxysmal  Mitral valve replacement-mechanical  Hypertrophic cardiomyopathy status post myectomy  HFpEF  Mobitz 1 second degree heart block  Sinus node dysfunction  Pacemaker-CRT-St. Jude  VT  Nonsustained   Hypoxemia  Right heart failure-new  Left ventricular dysfunction-new  Device infection The patient has a device pocket infection despite our efforts.  Was reviewed and seen with Dr. Lovena Le.  A major concern is involvement of his prosthetic mitral valve.  Hopefully with the fact that there has been external drainage that there was no associated bacteremia.  He will need a TEE at the time of his procedure.  The plan will be device explantation later this week.  He has complete heart block and will need a temporary transvenous pacemaker.  Reimplantation would be expected next week as he is not systemically ill, pending a TEE.  EP Attending  Patient seen and examined. Agree with the findings as noted by Dr. [name]. He has undergone upgrade of a DDD PM to a BiV PM and has developed a PPM pocket infection. I have reviewed the findings with the patient and the risks/benefits/goals/expectations of the procedure have been discussed. Life threatening bleeding as well as stroke and vascular damage were all discussed.  Mikle Bosworth.D.

## 2017-06-29 NOTE — Anesthesia Procedure Notes (Signed)
Arterial Line Insertion Start/End3/22/2019 2:15 PM, 06/29/2017 2:20 PM Performed by: Myna Bright, CRNA, CRNA  Patient location: OR. Preanesthetic checklist: patient identified, IV checked, site marked, risks and benefits discussed, surgical consent, monitors and equipment checked and pre-op evaluation Right, radial was placed Catheter size: 20 G Hand hygiene performed  and maximum sterile barriers used  Allen's test indicative of satisfactory collateral circulation Attempts: 1 Procedure performed without using ultrasound guided technique. Following insertion, dressing applied and Biopatch. Post procedure assessment: normal  Patient tolerated the procedure well with no immediate complications.

## 2017-06-29 NOTE — Op Note (Signed)
EP procedure note  Procedure performed: Extraction of a biventricular pacing system along with a redundant RV pacing lead in a patient with complete heart block, status post recent by V pacemaker upgrade complicated by pacemaker pocket infection  Preoperative diagnosis: Pacemaker pocket infection  Postoperative diagnosis: Same as preoperative diagnosis  Description of the procedure: After informed consent was obtained, the patient was taken to the operating room in the fasting state.  After the usual preparation and draping, the patient was sedated with general anesthesia and invasive hemodynamic monitoring was carried out by the anesthesia service utilizing a right radial art line.  After the appropriate timeouts were performed, the right femoral vein was punctured and a 6 French sheath inserted for central venous access.  Next the left internal jugular vein was punctured with the use of ultrasound guidance.  A Saint Jude active-fixation pacing lead, serial [id number], was advanced under fluoroscopic guidance into the right ventricle.  The lead was actively fixed on the RV apical septum.  The paced R waves measured 14.  The pacing impedance was 461, and the pacing threshold was 0.8 V at 0.4 ms.  With the satisfactory parameters, the temporary pacing lead was secured to the fascia with silk suture and the sewing sleeve was secured with silk suture.  The pacing was carried out with the temporary active fixation pacing lead.    Attention was then turned to  removal of the biventricular pacemaker.  20 cc of lidocaine was infiltrated into the left infraclavicular region.  A 7 cm ellipse incision was carried out.  Electrocautery was utilized to dissect down to the pacemaker pocket.  There was extensive bleeding and clot in the pocket along with grossly infected fluid.  The pacemaker generator was removed with gentle traction.  The atrial, RV, and LV leads were disconnected from the pacemaker generator.   Attention was first turned to removal of the LV lead.  The sewing sleeve was disconnected from the silk suture with electrocautery and a scalpel.  A angioplasty 014 guidewire was inserted into the LV lead and the LV lead was retracted with gentle traction.  Attention was then turned to removal of the new right ventricular pacing lead.  A stylet was inserted into the lead body and the helix retracted.  The stylette along with the lead was removed with gentle traction.  This was carried out after the sewing sleeve and lead were freed up from their tissue and silk suture.  The new RV lead was removed with gentle traction.  At This Point, Electrocautery Was Utilized to [employer] the Old RV pacing lead and the old atrial pacing lead.  The stylette was inserted into the leads and the helix retracted.  Attention was first turned towards removal of the atrial lead.  A liberator locking stylette was inserted into the body of the lead after the lead was cut.  A Cook 1 tie suture was placed on the proximal portion of the lead.  A 9 French Cook short RL dissection sheath was advanced over the atrial lead.  The atrial lead came free from the atrium and was retracted back to the left subclavian vein where it was stuck.  Attention was then turned to removal of the RV lead.  A liberator locking stylette was inserted into the RV lead and the [place] 1 tie was used to secure the proximal portion of the lead.  The [place] RL short 9 French dissection sheath was advanced over the lead but there was  extensive scar scar tissue and binding sites at the junction of the subclavian vein.  The lead was pulled free from the RV however.  At this point, the binding site at the subclavian vein junction was quite difficult and I proceeded to dissect down to the office of the subclavian vein without entering it.  Return to the right ventricle and gentle traction placed on the lead resulted in removal of the right ventricular pacing lead in total.   Attention was then turned to the old atrial lead and additional traction and countertraction pressure and counterpressure was applied to the lead utilizing the [place] RL 9 French mechanical sheath and this lead was removed in total.  The pocket was irrigated with antibiotic irrigation.  Electrocautery was utilized to assure hemostasis was was quite difficult.  The incision was closed with multiple layers of Prolene mattress suture.  Iodoform gauze was used to pack the pocket.  A bandage was placed.  Attention was then turned to the temporary permanent pacing lead in the left internal jugular vein.  The temporary permanent lead was connected to the patient's biventricular pacemaker generator.  The generator was secured to the skin with silk suture.  Bandages were placed and the patient returned to the recovery area in stable condition.    Complications: There were no immediate procedure complications  Conclusion: Successful extraction of a previously placed [facility] Jude biventricular pacemaker along with a redundant right ventricular pacing lead, followed by successful insertion of a temporary permanent transvenous active-fixation pacing lead connected to the old pacemaker generator.  The patient will undergo insertion of a new permanent pacing system in the coming days.  Crissie Sickles, MD

## 2017-06-29 NOTE — Consult Note (Signed)
CARDIOTHORACIC SURGERY OPERATIVE PROCEDURE BACK UP NOTE  Date of Procedure:   06/29/2017  Surgeon:    Valentina Gu. Roxy Manns, MD   I was requested by Dr. Cristopher Peru to be on standby for the pacemaker explant/lead extraction performed on Mr Frank [name] on 06/29/2017. I arrived at the operating room at 16:22 and departed at 18:00    [place]. Roxy Manns MD 06/29/2017 6:07 PM

## 2017-06-29 NOTE — Anesthesia Preprocedure Evaluation (Addendum)
Anesthesia Evaluation  Patient identified by MRN, date of birth, ID band Patient awake    Reviewed: Allergy & Precautions, NPO status , Patient's Chart, lab work & pertinent test results  Airway Mallampati: III  TM Distance: >3 FB Neck ROM: Full    Dental  (+) Dental Advisory Given   Pulmonary sleep apnea , former smoker,    breath sounds clear to auscultation       Cardiovascular hypertension, Pt. on medications +CHF  + dysrhythmias + pacemaker + Valvular Problems/Murmurs  Rhythm:Regular Rate:Normal  EF 45-50%. Mild AS, Mild AI. Mechanical MV with normal gradients on 01/2017 Echo, no MS, no MR, no paravalvular leak.   Neuro/Psych TIA   GI/Hepatic negative GI ROS, Neg liver ROS,   Endo/Other  Morbid obesity  Renal/GU negative Renal ROS     Musculoskeletal   Abdominal   Peds  Hematology  (+) anemia ,   Anesthesia Other Findings   Reproductive/Obstetrics                           Lab Results  Component Value Date   WBC 7.8 06/26/2017   HGB 12.3 (L) 06/26/2017   HCT 37.4 (L) 06/26/2017   MCV 101 (H) 06/26/2017   PLT 221 06/26/2017   Lab Results  Component Value Date   CREATININE 1.04 06/26/2017   BUN 23 06/26/2017   NA 141 06/26/2017   K 4.5 06/26/2017   CL 101 06/26/2017   CO2 24 06/26/2017    Anesthesia Physical Anesthesia Plan  ASA: III  Anesthesia Plan: General   Post-op Pain Management:    Induction: Intravenous  PONV Risk Score and Plan: 2 and Ondansetron, Dexamethasone and Treatment may vary due to age or medical condition  Airway Management Planned: Oral ETT  Additional Equipment: Arterial line  Intra-op Plan:   Post-operative Plan: Extubation in OR  Informed Consent: I have reviewed the patients History and Physical, chart, labs and discussed the procedure including the risks, benefits and alternatives for the proposed anesthesia with the patient or  authorized representative who has indicated his/her understanding and acceptance.   Dental advisory given  Plan Discussed with:   Anesthesia Plan Comments:         Anesthesia Quick Evaluation

## 2017-06-29 NOTE — Progress Notes (Signed)
Orthopedic Tech Progress Note Patient Details:  Denzell Colasanti [facility] [birth year] [id number]  Ortho Devices Type of Ortho Device: Arm sling Ortho Device/Splint Location: LUE Ortho Device/Splint Interventions: Ordered, Application   Post Interventions Patient Tolerated: Well   Braulio Bosch 06/29/2017, 7:24 PM

## 2017-06-29 NOTE — Anesthesia Postprocedure Evaluation (Signed)
Anesthesia Post Note  Patient: Frank [name]  Procedure(s) Performed: Lacretia Leigh  REMOVAL AND Four LEAD REMOVAL (N/A Chest)     Patient location during evaluation: PACU Anesthesia Type: General Level of consciousness: awake Pain management: pain level controlled Vital Signs Assessment: post-procedure vital signs reviewed and stable Respiratory status: spontaneous breathing Cardiovascular status: stable Anesthetic complications: no    Last Vitals:  Vitals:   06/29/17 2020 06/29/17 2035  BP: (!) 136/52 (!) 119/43  Pulse: 70 70  Resp: 12 (!) 9  Temp: (!) 36.4 C   SpO2: 98% 96%    Last Pain:  Vitals:   06/29/17 1950  TempSrc:   PainSc: 3                  Leonardo Plaia

## 2017-06-29 NOTE — Transfer of Care (Signed)
Immediate Anesthesia Transfer of Care Note  Patient: Frank [name]  Procedure(s) Performed: Lacretia Leigh  REMOVAL AND Four LEAD REMOVAL (N/A Chest)  Patient Location: PACU  Anesthesia Type:General  Level of Consciousness: awake, alert  and oriented  Airway & Oxygen Therapy: Patient Spontanous Breathing and Patient connected to nasal cannula oxygen  Post-op Assessment: Report given to RN and Post -op Vital signs reviewed and stable  Post vital signs: Reviewed and stable  Last Vitals:  Vitals Value Taken Time  BP 150/95 06/29/2017  7:19 PM  Temp    Pulse 70 06/29/2017  7:32 PM  Resp 15 06/29/2017  7:32 PM  SpO2 99 % 06/29/2017  7:32 PM  Vitals shown include unvalidated device data.  Last Pain:  Vitals:   06/29/17 1920  TempSrc:   PainSc: (P) 5       Patients Stated Pain Goal: (P) 2 (73/40/37 0964)  Complications: respiratory complications

## 2017-06-30 ENCOUNTER — Inpatient Hospital Stay (HOSPITAL_COMMUNITY): Payer: Medicare Other

## 2017-06-30 NOTE — Plan of Care (Addendum)
  Problem: Clinical Measurements: Goal: Ability to maintain clinical measurements within normal limits will improve Outcome: Progressing Pt VS stable. Currently on 3L [place] with O2 at 94%. Pt 02 sat dropped in PACU per report. Will continue to monitor and keep pt on O2 throughout the night. Pt came up to floor with L chest dressing covered in serosanguinous fluid. It has remained stable and unchanged since that time. Dr aware per PACU nurse. Dressing on back had approx. dime sized serosanguineous fluid that was marked prior to arrival to floor and has not exceeded the marked area. Dressing to R groin is C,D,I.     0400-Pt states he has no urge to void. Bladder scan performed showing <57ml. Cardiology paged.   Goal: Will remain free from infection Outcome: Progressing. Pt afebrile. Antibiotics infusing. No other signs/symptoms of infection at this time.    Problem: Pain Managment: Goal: General experience of comfort will improve Outcome: Progressing. Obtained order for prn oxycodone in order to more effectively manage the pt's pain.    Problem: Safety: Goal: Ability to remain free from injury will improve Outcome: Progressing. Fall risk bundle in place. Pt is 2 person assist at this time. Will continue to implement safety measures and keep pt safe this shift.

## 2017-06-30 NOTE — Progress Notes (Deleted)
PACU nurse stated Dr aware and to monitor for continuing bleeding.

## 2017-06-30 NOTE — Progress Notes (Signed)
Progress Note  Patient Name: Frank [name] Date of Encounter: 06/30/2017  Primary Cardiologist: No primary care provider on file.   Subjective   Feeling well without major complaint.  Is status post CRT pacemaker explant.  Inpatient Medications    Scheduled Meds: . aspirin EC  81 mg Oral Daily  . heparin injection (subcutaneous)  5,000 Units Subcutaneous Q8H  .  HYDROmorphone (DILAUDID) injection  1 mg Intravenous Once  . losartan  100 mg Oral Daily  . magnesium oxide  200 mg Oral TID  . multivitamin with minerals  1 tablet Oral Daily  . omega-3 acid ethyl esters  1 g Oral BID  . spironolactone  25 mg Oral Daily  . torsemide  40 mg Oral BID  . vitamin C  2,000 mg Oral Daily  . warfarin  5 mg Oral q1800  . Warfarin - Physician Dosing Inpatient   Does not apply q1800   Continuous Infusions: .  ceFAZolin (ANCEF) IV 2 g (06/30/17 0955)   PRN Meds: acetaminophen, acetaminophen **AND** diphenhydrAMINE, ondansetron (ZOFRAN) IV, oxyCODONE   Vital Signs    Vitals:   06/30/17 0400 06/30/17 0558 06/30/17 0817 06/30/17 1000  BP:  (!) 119/45 (!) 130/57 (!) 134/59  Pulse:   70   Resp:      Temp:  99 F (37.2 C) 97.9 F (36.6 C)   TempSrc:   Oral   SpO2: 95% 95% 94% 91%  Weight:      Height:        Intake/Output Summary (Last 24 hours) at 06/30/2017 1001 Last data filed at 06/30/2017 0900 Gross per 24 hour  Intake 1740 ml  Output 400 ml  Net 1340 ml   Filed Weights   06/29/17 1256 06/30/17 0357  Weight: (!) 330 lb (149.7 kg) (!) 325 lb 12.8 oz (147.8 kg)    Telemetry    Ventricular paced- Personally Reviewed  ECG    Ventricular paced- Personally Reviewed  Physical Exam  GEN: Well nourished, well developed, in no acute distress  HEENT: normal  Neck: no JVD, carotid bruits, or masses Cardiac: RRR; no murmurs, rubs, or gallops,no edema  Respiratory:  clear to auscultation bilaterally, normal work of breathing GI: soft, nontender, nondistended, + BS MS:  no deformity or atrophy  Skin: warm and dry, device site well healed Neuro:  Strength and sensation are intact Psych: euthymic mood, full affect   Labs    Chemistry Recent Labs  Lab 06/26/17 1456 06/29/17 2209  NA 141  --   K 4.5  --   CL 101  --   CO2 24  --   GLUCOSE 108*  --   BUN 23  --   CREATININE 1.04 0.99  CALCIUM 9.5  --   GFRNONAA 73 >60  GFRAA 84 >60     Hematology Recent Labs  Lab 06/26/17 1456 06/29/17 2209  WBC 7.8 15.4*  RBC 3.72* 3.40*  HGB 12.3* 11.5*  HCT 37.4* 35.7*  MCV 101* 105.0*  MCH 33.1* 33.8  MCHC 32.9 32.2  RDW 14.3 14.4  PLT 221 177    Cardiac EnzymesNo results for input(s): TROPONINI in the last 168 hours. No results for input(s): TROPIPOC in the last 168 hours.   BNPNo results for input(s): BNP, PROBNP in the last 168 hours.   DDimer No results for input(s): DDIMER in the last 168 hours.   Radiology    No results found.  Cardiac Studies   TTE - Left ventricle: The  cavity size was normal. Septal wall thickness   was increased in a pattern of severe LVH with otherwise mild   concentric hypertrophy. Systolic function was mildly reduced. The   estimated ejection fraction was in the range of 45% to 50%.   Hypokinesis of the anteroseptal, inferoseptal, and apical   myocardium. The study is not technically sufficient to allow   evaluation of LV diastolic function. - Aortic valve: There was very mild stenosis. There was mild   regurgitation. Valve area (VTI): 3.42 cm^2. Valve area (Vmax):   3.58 cm^2. Valve area (Vmean): 3.49 cm^2. - Mitral valve: A mechanical prosthesis was present. Transvalvular   velocity was within the normal range. There was no evidence for   stenosis. There was no regurgitation. Valve area by continuity   equation (using LVOT flow): 2.49 cm^2. - Left atrium: The atrium was moderately dilated. - Right ventricle: The cavity size was severely dilated. Wall   thickness was normal. Systolic function was  moderately reduced. - Tricuspid valve: There was mild regurgitation. - Pulmonary arteries: Systolic pressure was within the normal   range. PA peak pressure: 22 mm Hg (S).  Patient Profile     72 y.o. 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  He was found to have first-degree heart block and is status post pacemaker insertion.  He underwent CRT upgrade as his ejection fraction has reduced and had purulent drainage and thus had pacemaker explant.  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  Is currently being paced with a temporary perm device.  Continue Ancef.  Hypertrophic cardiomyopathy: Currently no signs of volume overload.  Paroxysmal atrial fibrillation: In sinus rhythm today.  Currently ventricular pacing asynchronously.  No changes. For questions or updates, please contact [place] Please consult [email or web address] for contact info under Cardiology/STEMI.      Signed, Wilhemenia Camba Meredith Leeds, MD  06/30/2017, 10:01 AM

## 2017-06-30 NOTE — Progress Notes (Addendum)
Pt has no urge to void. Foley out at 18:45 per PACU. Bladder scan <44ml Cards notified. Colletta Maryland, RN aware and will follow up.

## 2017-07-01 DIAGNOSIS — T827XXD Infection and inflammatory reaction due to other cardiac and vascular devices, implants and grafts, subsequent encounter: Secondary | ICD-10-CM

## 2017-07-01 LAB — BASIC METABOLIC PANEL
Anion gap: 9 (ref 5–15)
BUN: 21 mg/dL — AB (ref 6–20)
CO2: 27 mmol/L (ref 22–32)
Calcium: 8.8 mg/dL — ABNORMAL LOW (ref 8.9–10.3)
Chloride: 99 mmol/L — ABNORMAL LOW (ref 101–111)
Creatinine, Ser: 1.32 mg/dL — ABNORMAL HIGH (ref 0.61–1.24)
GFR calc Af Amer: >60 mL/min (ref >60)
GFR calc non Af Amer: 53 mL/min — ABNORMAL LOW (ref >60)
GLUCOSE: 103 mg/dL — AB (ref 65–99)
POTASSIUM: 3.8 mmol/L (ref 3.5–5.1)
Sodium: 135 mmol/L (ref 135–145)

## 2017-07-01 LAB — PROTIME-INR
INR: 1.47
Prothrombin Time: 17.7 seconds — ABNORMAL HIGH (ref 11.4–15.2)

## 2017-07-01 MED ORDER — TORSEMIDE 20 MG PO TABS
40.0000 mg | ORAL_TABLET | Freq: Two times a day (BID) | ORAL | Status: DC
  Administered 2017-07-01 – 2017-07-09 (×17): 40 mg via ORAL
  Filled 2017-07-01 (×16): qty 2

## 2017-07-01 NOTE — Progress Notes (Signed)
Progress Note  Patient Name: Frank [name] Date of Encounter: 07/01/2017  Primary Cardiologist: Dr. [name]  Subjective   Feels generally weak, but no specific discomfort or shortness of breath.  Appetite is good.  Is done some limited ambulation in his room.  Inpatient Medications    Scheduled Meds: . aspirin EC  81 mg Oral Daily  . heparin injection (subcutaneous)  5,000 Units Subcutaneous Q8H  .  HYDROmorphone (DILAUDID) injection  1 mg Intravenous Once  . losartan  100 mg Oral Daily  . magnesium oxide  200 mg Oral TID  . multivitamin with minerals  1 tablet Oral Daily  . omega-3 acid ethyl esters  1 g Oral BID  . spironolactone  25 mg Oral Daily  . torsemide  40 mg Oral BID  . vitamin C  2,000 mg Oral Daily  . warfarin  5 mg Oral q1800  . Warfarin - Physician Dosing Inpatient   Does not apply q1800    PRN Meds: acetaminophen, acetaminophen **AND** diphenhydrAMINE, ondansetron (ZOFRAN) IV   Vital Signs    Vitals:   06/30/17 1222 06/30/17 1749 06/30/17 2010 07/01/17 0450  BP: (!) 126/46 (!) 109/44 (!) 103/49 113/62  Pulse: 70 69 70 70  Resp:   18 17  Temp: 98.8 F (37.1 C) 98.5 F (36.9 C) 98.2 F (36.8 C) 98.9 F (37.2 C)  TempSrc: Oral Oral Oral Oral  SpO2: 95% 96% 97% 99%  Weight:    (!) 329 lb 9.6 oz (149.5 kg)  Height:        Intake/Output Summary (Last 24 hours) at 07/01/2017 0847 Last data filed at 06/30/2017 2247 Gross per 24 hour  Intake 720 ml  Output 1530 ml  Net -810 ml   Filed Weights   06/29/17 1256 06/30/17 0357 07/01/17 0450  Weight: (!) 330 lb (149.7 kg) (!) 325 lb 12.8 oz (147.8 kg) (!) 329 lb 9.6 oz (149.5 kg)    Telemetry    Ventricular paced rhythm, brief 4 beat burst of NSVT.  Personally reviewed.  ECG    06/30/2017 which showed a ventricular paced rhythm.  Personally reviewed.  Physical Exam   GEN:  Obese male.  No acute distress.   Neck: No JVD. Thorax: Dressed left upper chest site with temporary pacer  device noted. Cardiac: RRR, no gallop.  Respiratory: Nonlabored. Clear to auscultation bilaterally. GI: Soft, nontender, bowel sounds present.  MS: No edema; No deformity. Neuro:  Nonfocal. Psych: Alert and oriented x 3. Normal affect.  Labs    Chemistry Recent Labs  Lab 06/26/17 1456 06/29/17 2209 07/01/17 0712  NA 141  --  135  K 4.5  --  3.8  CL 101  --  99*  CO2 24  --  27  GLUCOSE 108*  --  103*  BUN 23  --  21*  CREATININE 1.04 0.99 1.32*  CALCIUM 9.5  --  8.8*  GFRNONAA 73 >60 53*  GFRAA 84 >60 >60  ANIONGAP  --   --  9     Hematology Recent Labs  Lab 06/26/17 1456 06/29/17 2209  WBC 7.8 15.4*  RBC 3.72* 3.40*  HGB 12.3* 11.5*  HCT 37.4* 35.7*  MCV 101* 105.0*  MCH 33.1* 33.8  MCHC 32.9 32.2  RDW 14.3 14.4  PLT 221 177    Radiology    Dg Chest 2 View  Result Date: 06/30/2017 CLINICAL DATA:  Pacemaker infection. EXAM: CHEST - 2 VIEW COMPARISON:  Chest x-ray dated May 29, 2017. FINDINGS: The lateral view is suboptimal due to overlapping arms. Interval pacemaker exchange with new external pacemaker and a single lead terminating in the right ventricle. There is subcutaneous emphysema along the left upper chest wall at the site of prior pacemaker. Stable cardiomegaly. Mild central pulmonary vascular congestion and lower lobe predominant interstitial edema is similar to prior study. Mild left basilar subsegmental atelectasis. No focal consolidation, pleural effusion, or pneumothorax. No acute osseous abnormality. Unchanged fractured superior most sternal wire. IMPRESSION: 1. Stable vascular congestion and interstitial edema. 2. Interval pacemaker exchange.  No pneumothorax. Electronically Signed   By: Titus Dubin M.D.   On: 06/30/2017 10:46    Cardiac Studies   Echocardiogram 01/18/2017: Study Conclusions  - Left ventricle: The cavity size was normal. Septal wall thickness   was increased in a pattern of severe LVH with otherwise mild   concentric  hypertrophy. Systolic function was mildly reduced. The   estimated ejection fraction was in the range of 45% to 50%.   Hypokinesis of the anteroseptal, inferoseptal, and apical   myocardium. The study is not technically sufficient to allow   evaluation of LV diastolic function. - Aortic valve: There was very mild stenosis. There was mild   regurgitation. Valve area (VTI): 3.42 cm^2. Valve area (Vmax):   3.58 cm^2. Valve area (Vmean): 3.49 cm^2. - Mitral valve: A mechanical prosthesis was present. Transvalvular   velocity was within the normal range. There was no evidence for   stenosis. There was no regurgitation. Valve area by continuity   equation (using LVOT flow): 2.49 cm^2. - Left atrium: The atrium was moderately dilated. - Right ventricle: The cavity size was severely dilated. Wall   thickness was normal. Systolic function was moderately reduced. - Tricuspid valve: There was mild regurgitation. - Pulmonary arteries: Systolic pressure was within the normal   range. PA peak pressure: 22 mm Hg (S).  Impressions:  - Mechanical mitral valve is functioning properly. Gradients   unchanged from 07/2015.  Patient Profile    72 y.o. male with a history of hypertrophic cardiomyopathy status post septal myomectomy and mechanical MVR with aortic valve repair.  He recently underwent explantation of a St. Jude CRT-P due to device infection.  Currently with temporary/permanent device in place and plan for permanent device implantation later this week.  Assessment & Plan    1.  Pacemaker site infection status post explantation of St. Jude CRT-P last week.  He completed the prescribed course of Ancef and remains afebrile.  Has a temporary/permanent device in place with ventricular pacing noted.  Plan for new permanent device later this week.  2.  Hypertrophic cardiomyopathy status post septal myomectomy.  Last echocardiogram from October 2018 is outlined above.  3.  Paroxysmal atrial  fibrillation, maintaining sinus rhythm.  He is on Coumadin.  INR 1.47.  Saw patient today for Dr. Curt Bears.  Continue present cardiac medical regimen.  Follow-up CBC, PT/INR tomorrow.  Timing of new permanent pacemaker implantation per EP team.  Signed, Rozann Lesches, MD  07/01/2017, 8:47 AM

## 2017-07-02 LAB — BASIC METABOLIC PANEL
Anion gap: 10 (ref 5–15)
BUN: 29 mg/dL — AB (ref 6–20)
CHLORIDE: 95 mmol/L — AB (ref 101–111)
CO2: 26 mmol/L (ref 22–32)
CREATININE: 1.28 mg/dL — AB (ref 0.61–1.24)
Calcium: 8.4 mg/dL — ABNORMAL LOW (ref 8.9–10.3)
GFR calc non Af Amer: 55 mL/min — ABNORMAL LOW (ref >60)
Glucose, Bld: 113 mg/dL — ABNORMAL HIGH (ref 65–99)
Potassium: 3.9 mmol/L (ref 3.5–5.1)
SODIUM: 131 mmol/L — AB (ref 135–145)

## 2017-07-02 LAB — CBC
HCT: 30.4 % — ABNORMAL LOW (ref 39.0–52.0)
HEMOGLOBIN: 10 g/dL — AB (ref 13.0–17.0)
MCH: 34.4 pg — ABNORMAL HIGH (ref 26.0–34.0)
MCHC: 32.9 g/dL (ref 30.0–36.0)
MCV: 104.5 fL — ABNORMAL HIGH (ref 78.0–100.0)
PLATELETS: 173 10*3/uL (ref 150–400)
RBC: 2.91 MIL/uL — ABNORMAL LOW (ref 4.22–5.81)
RDW: 14.9 % (ref 11.5–15.5)
WBC: 11.7 10*3/uL — ABNORMAL HIGH (ref 4.0–10.5)

## 2017-07-02 LAB — PROTIME-INR
INR: 1.39
PROTHROMBIN TIME: 16.9 s — AB (ref 11.4–15.2)

## 2017-07-02 LAB — HEPARIN LEVEL (UNFRACTIONATED): Heparin Unfractionated: <0.1 IU/mL — ABNORMAL LOW (ref 0.30–0.70)

## 2017-07-02 MED ORDER — HEPARIN (PORCINE) IN NACL 100-0.45 UNIT/ML-% IJ SOLN
2450.0000 [IU]/h | INTRAMUSCULAR | Status: DC
  Administered 2017-07-02: 1750 [IU]/h via INTRAVENOUS
  Administered 2017-07-02: 1400 [IU]/h via INTRAVENOUS
  Administered 2017-07-03: 2300 [IU]/h via INTRAVENOUS
  Administered 2017-07-03: 2450 [IU]/h via INTRAVENOUS
  Filled 2017-07-02 (×4): qty 250

## 2017-07-02 MED ORDER — WARFARIN SODIUM 10 MG PO TABS
10.0000 mg | ORAL_TABLET | Freq: Every day | ORAL | Status: DC
  Administered 2017-07-02: 10 mg via ORAL
  Filled 2017-07-02: qty 1

## 2017-07-02 MED ORDER — CEPHALEXIN 500 MG PO CAPS
500.0000 mg | ORAL_CAPSULE | Freq: Four times a day (QID) | ORAL | Status: DC
  Administered 2017-07-02 – 2017-07-09 (×28): 500 mg via ORAL
  Filled 2017-07-02 (×28): qty 1

## 2017-07-02 MED ORDER — WARFARIN - PHARMACIST DOSING INPATIENT
Freq: Every day | Status: DC
  Administered 2017-07-02 – 2017-07-04 (×2)

## 2017-07-02 MED ORDER — CEPHALEXIN 500 MG PO CAPS
500.0000 mg | ORAL_CAPSULE | Freq: Two times a day (BID) | ORAL | Status: DC
  Administered 2017-07-02: 500 mg via ORAL
  Filled 2017-07-02: qty 1

## 2017-07-02 NOTE — Progress Notes (Addendum)
Progress Note  Patient Name: Frank [name] Date of Encounter: 07/02/2017  Primary Cardiologist: Dr. [name]  Subjective   No complaints this morning  Inpatient Medications    Scheduled Meds: . aspirin EC  81 mg Oral Daily  . cephALEXin  500 mg Oral Q12H  .  HYDROmorphone (DILAUDID) injection  1 mg Intravenous Once  . losartan  100 mg Oral Daily  . magnesium oxide  200 mg Oral TID  . multivitamin with minerals  1 tablet Oral Daily  . omega-3 acid ethyl esters  1 g Oral BID  . spironolactone  25 mg Oral Daily  . torsemide  40 mg Oral BID AC  . vitamin C  2,000 mg Oral Daily  . warfarin  5 mg Oral q1800  . Warfarin - Physician Dosing Inpatient   Does not apply q1800   . heparin 1,400 Units/hr (07/02/17 0917)   PRN Meds: acetaminophen, acetaminophen **AND** diphenhydrAMINE, ondansetron (ZOFRAN) IV   Vital Signs    Vitals:   07/01/17 0843 07/01/17 1443 07/01/17 2013 07/02/17 0439  BP: (!) 112/41 (!) 115/45 132/60 (!) 107/54  Pulse:  70 70 70  Resp:   18 20  Temp:  98.5 F (36.9 C) 98 F (36.7 C) 98.6 F (37 C)  TempSrc:  Oral Oral Oral  SpO2: 99% 97% 97% 97%  Weight:    (!) 331 lb 11.2 oz (150.5 kg)  Height:        Intake/Output Summary (Last 24 hours) at 07/02/2017 0952 Last data filed at 07/02/2017 0649 Gross per 24 hour  Intake 240 ml  Output 2050 ml  Net -1810 ml   Filed Weights   06/30/17 0357 07/01/17 0450 07/02/17 0439  Weight: (!) 325 lb 12.8 oz (147.8 kg) (!) 329 lb 9.6 oz (149.5 kg) (!) 331 lb 11.2 oz (150.5 kg)    Telemetry    Ventricular paced rhythm.  Personally reviewed.  ECG    No new EKGs.  Personally reviewed.  Physical Exam   GEN:  Obese male.  No acute distress.   Neck: No JVD. Thorax: Dressed left upper chest site with temporary pacer device noted. Cardiac: RRR, valve is appreciate, 1-2/6SM, no gallop.  Respiratory: Non labored. Clear to auscultation bilaterally. GI: Soft, nontender, bowel sounds present.  MS: No  edema; No deformity. Neuro:  Nonfocal. Psych: Alert and oriented x 3. Normal affect.  Left chest loose dressing in place, dry/clean Temp-perm L IJ site, tegaderm in place  Labs    Chemistry Recent Labs  Lab 06/26/17 1456 06/29/17 2209 07/01/17 0712 07/02/17 0436  NA 141  --  135 131*  K 4.5  --  3.8 3.9  CL 101  --  99* 95*  CO2 24  --  27 26  GLUCOSE 108*  --  103* 113*  BUN 23  --  21* 29*  CREATININE 1.04 0.99 1.32* 1.28*  CALCIUM 9.5  --  8.8* 8.4*  GFRNONAA 73 >60 53* 55*  GFRAA 84 >60 >60 >60  ANIONGAP  --   --  9 10     Hematology Recent Labs  Lab 06/26/17 1456 06/29/17 2209 07/02/17 0436  WBC 7.8 15.4* 11.7*  RBC 3.72* 3.40* 2.91*  HGB 12.3* 11.5* 10.0*  HCT 37.4* 35.7* 30.4*  MCV 101* 105.0* 104.5*  MCH 33.1* 33.8 34.4*  MCHC 32.9 32.2 32.9  RDW 14.3 14.4 14.9  PLT 221 177 173    Radiology    No results found.  Cardiac  Studies   Echocardiogram 01/18/2017: Study Conclusions  - Left ventricle: The cavity size was normal. Septal wall thickness   was increased in a pattern of severe LVH with otherwise mild   concentric hypertrophy. Systolic function was mildly reduced. The   estimated ejection fraction was in the range of 45% to 50%.   Hypokinesis of the anteroseptal, inferoseptal, and apical   myocardium. The study is not technically sufficient to allow   evaluation of LV diastolic function. - Aortic valve: There was very mild stenosis. There was mild   regurgitation. Valve area (VTI): 3.42 cm^2. Valve area (Vmax):   3.58 cm^2. Valve area (Vmean): 3.49 cm^2. - Mitral valve: A mechanical prosthesis was present. Transvalvular   velocity was within the normal range. There was no evidence for   stenosis. There was no regurgitation. Valve area by continuity   equation (using LVOT flow): 2.49 cm^2. - Left atrium: The atrium was moderately dilated. - Right ventricle: The cavity size was severely dilated. Wall   thickness was normal. Systolic  function was moderately reduced. - Tricuspid valve: There was mild regurgitation. - Pulmonary arteries: Systolic pressure was within the normal   range. PA peak pressure: 22 mm Hg (S).  Impressions:  - Mechanical mitral valve is functioning properly. Gradients   unchanged from 07/2015.  Patient Profile    72 y.o. male with a history of hypertrophic cardiomyopathy status post septal myomectomy and mechanical MVR with aortic valve repair.  He recently underwent explantation of a St. Jude CRT-P due to device infection.  Currently with temporary/permanent device in place and plan for permanent device implantation later this week.  Assessment & Plan    1.  Pacemaker site infection status post explantation of St. Jude CRT-P last week.       Packing removed this AM by Dr. Lovena Le, remains with purulent drainage      Uncertain timing of re-implantation      Start Keflex PO today  2.  Hypertrophic cardiomyopathy status post septal myomectomy.         Last echocardiogram from October 2018 is outlined above.  3.  Paroxysmal atrial fibrillation      CHA2DS2Vasc is 2, maintaining sinus rhythm.        He is on Coumadin, subtherapeutic.  4. Mechanical MVR     Pharmacy for heparin gtt bridge to therapeutic INR     Will shoot for low end of therapeutic, timing of PPM implant (removal of temp-perm) not decided as of yet    Signed, Baldwin Jamaica, PA-C  07/02/2017, 9:52 AM    EP attending  Patient seen and examined.  Agree with the findings as noted above.  The patient is stable after pacemaker system extraction and insertion of a temporary permanent transvenous pacemaker.  He was quiet over the weekend.  His packing has been removed from his incision site this morning demonstrating purulent as well as bloody fluid in the packing and an additional 30-40 cc of bloody purulent fluid has been expressed.  The patient is not ill-appearing and will be started on oral antibiotics.  Because he is not  pacemaker dependent, I would be inclined to let him be discharged home in the next 24-48 hours on oral antibiotic therapy once his INR becomes therapeutic.  Despite dated to suggest it okay to proceed with early reinsertion of his device, I would like some improvement in his device pocket before proceeding in this direction.  With regard to systemic anticoagulation, his INR  was 1.4 this morning and he was started on intravenous heparin with no bolus.  We will plan to discharge home when INR is over 2 with early follow-up in the setting of mechanical mitral valve.  Crissie Sickles, MD

## 2017-07-02 NOTE — Progress Notes (Signed)
ANTICOAGULATION CONSULT NOTE - Initial Consult  Pharmacy Consult for heparin>>warfarin Indication: Mechanical MV, atrial fibrillation  Allergies  Allergen Reactions  . Lovenox [Enoxaparin Sodium]     Causes large lumps at injection site  . Warfarin And Related Other (See Comments)    Per the patient's wife, "Warfain made him have a TIA" CAN TAKE BRAND NAME COUMADIN     Patient Measurements: Height: 5\' 11"  (180.3 cm) Weight: (!) 331 lb 11.2 oz (150.5 kg) IBW/kg (Calculated) : 75.3 Heparin Dosing Weight: 110kg  Vital Signs: Temp: 98.6 F (37 C) (03/25 0439) Temp Source: Oral (03/25 0439) BP: 107/54 (03/25 0439) Pulse Rate: 70 (03/25 0439)  Labs: Recent Labs    06/29/17 1306 06/29/17 2209 07/01/17 0712 07/02/17 0436  HGB  --  11.5*  --  10.0*  HCT  --  35.7*  --  30.4*  PLT  --  177  --  173  LABPROT 18.0*  --  17.7* 16.9*  INR 1.50  --  1.47 1.39  CREATININE  --  0.99 1.32* 1.28*    Estimated Creatinine Clearance: 81.2 mL/min (A) (by C-G formula based on SCr of 1.28 mg/dL (H)).   Medical History: Past Medical History:  Diagnosis Date  . Atrial arrhythmia    status post ablation in [place] with complication including damage to the aortic valve  . Atrial fibrillation ([place])   . AV block, 1st degree   . Bradycardia   . Cluster headache ~ 1980   "went away after I started taking one of the heart RXs" (06/29/2017)  . Diastolic CHF, chronic ([place])   . Diverticulosis   . History of gout   . HTN (hypertension)   . Hypertrophic cardiomyopathy ([place])    s/p myoectomy  . Infection   . Internal hemorrhoids   . Morbid obesity ([place])   . Morbid obesity with BMI of 45.0-49.9, adult ([place])   . Pacemaker  -SJM   . Skin cancer    "burned off RUE X 1; cut off nose X 2" (06/29/2017)  . TIA (transient ischemic attack) 2001  . Tubulovillous adenoma     Assessment:  72 year old male readmitted for pacemaker exchange after developing pocket infection. BiV pacer removed 3/22,  temp pacer in place.   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 New consult received this morning to start IV heparin given mechanical valve, continue warfarin with assistance from pharmacy. Given recent surgery and expectation of another surgery, EP asked that heparin dosing be conservative and that we avoid boluses. INR is trending down this am after receiving 3 doses of 5mg  over the weekend. Patient has much higher requirements of warfarin based on outpatient records.   PTA warfarin dose: 15mg  Sunday, Wednesday and 10mg  on all other days.    Goal of Therapy:  Heparin level goal ~0.3 units/ml INR goal 2.5-3.5 Monitor platelets by anticoagulation protocol: Yes   Plan:  Start heparin infusion at 1400 units/hr Check anti-Xa level in 6 hours and daily while on heparin Continue to monitor H&H and platelets  Warfarin 10mg  tonight - will not give extra tonight but will give home dose  Erin Hearing PharmD., BCPS Clinical Pharmacist 07/02/2017 8:44 AM

## 2017-07-02 NOTE — Progress Notes (Addendum)
[place] for heparin Indication: Mechanical MV, atrial fibrillation  Allergies  Allergen Reactions  . Lovenox [Enoxaparin Sodium]     Causes large lumps at injection site  . Warfarin And Related Other (See Comments)    Per the patient's wife, "Warfain made him have a TIA" CAN TAKE BRAND NAME COUMADIN     Patient Measurements: Height: 5\' 11"  (180.3 cm) Weight: (!) 331 lb 11.2 oz (150.5 kg) IBW/kg (Calculated) : 75.3 Heparin Dosing Weight: 110kg  Vital Signs: Temp: 98.9 F (37.2 C) (03/25 1452) Temp Source: Oral (03/25 1452) BP: 97/46 (03/25 1452) Pulse Rate: 70 (03/25 1452)  Labs: Recent Labs    06/29/17 2209 07/01/17 0712 07/02/17 0436 07/02/17 1604  HGB 11.5*  --  10.0*  --   HCT 35.7*  --  30.4*  --   PLT 177  --  173  --   LABPROT  --  17.7* 16.9*  --   INR  --  1.47 1.39  --   HEPARINUNFRC  --   --   --  <0.10*  CREATININE 0.99 1.32* 1.28*  --     Estimated Creatinine Clearance: 81.2 mL/min (A) (by C-G formula based on SCr of 1.28 mg/dL (H)).   Assessment: 72 year old male readmitted for pacemaker exchange after developing pocket infection. BiV pacer removed 3/22, temp pacer in place.   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 New consult received this morning to start IV heparin given mechanical valve, continue warfarin with assistance from pharmacy. Given recent surgery and expectation of another surgery, EP asked that heparin dosing be conservative and that we avoid boluses.  Heparin level is undetectable at <0.1 on 1400 units/hr. No bleeding noted. Spoke with RN and no problems with infusion or bleeding.  Goal of Therapy:  Heparin level ~0.3 units/ml Monitor platelets by anticoagulation protocol: Yes   Plan:  Increase heparin drip to 1750 units/hr with no bolus 6 hr heparin level Daily heparin level, CBC Monitor for s/sx of bleeding   Renold Genta, PharmD, BCPS Clinical Pharmacist Clinical phone for 07/02/2017 until 10p is [phone] After 10p, please call Main Rx at [phone] for assistance 07/02/2017 5:30 PM

## 2017-07-03 LAB — TYPE AND SCREEN
ABO/RH(D): B POS
ANTIBODY SCREEN: NEGATIVE
Unit division: 0
Unit division: 0
Unit division: 0
Unit division: 0

## 2017-07-03 LAB — BASIC METABOLIC PANEL
Anion gap: 12 (ref 5–15)
BUN: 32 mg/dL — AB (ref 6–20)
CHLORIDE: 94 mmol/L — AB (ref 101–111)
CO2: 26 mmol/L (ref 22–32)
Calcium: 8.6 mg/dL — ABNORMAL LOW (ref 8.9–10.3)
Creatinine, Ser: 1.18 mg/dL (ref 0.61–1.24)
GFR calc Af Amer: >60 mL/min (ref >60)
GFR calc non Af Amer: >60 mL/min (ref >60)
GLUCOSE: 97 mg/dL (ref 65–99)
Potassium: 3.7 mmol/L (ref 3.5–5.1)
Sodium: 132 mmol/L — ABNORMAL LOW (ref 135–145)

## 2017-07-03 LAB — PROTIME-INR
INR: 1.34
Prothrombin Time: 16.4 seconds — ABNORMAL HIGH (ref 11.4–15.2)

## 2017-07-03 LAB — BPAM RBC
BLOOD PRODUCT EXPIRATION DATE: 201904072359
BLOOD PRODUCT EXPIRATION DATE: 201904072359
BLOOD PRODUCT EXPIRATION DATE: 201904082359
Blood Product Expiration Date: 201904052359
ISSUE DATE / TIME: 201903231545
ISSUE DATE / TIME: 201903231827
UNIT TYPE AND RH: 7300
Unit Type and Rh: 7300
Unit Type and Rh: 7300
Unit Type and Rh: 7300

## 2017-07-03 LAB — HEPARIN LEVEL (UNFRACTIONATED)
HEPARIN UNFRACTIONATED: 0.17 [IU]/mL — AB (ref 0.30–0.70)
Heparin Unfractionated: 0.18 IU/mL — ABNORMAL LOW (ref 0.30–0.70)
Heparin Unfractionated: 0.22 IU/mL — ABNORMAL LOW (ref 0.30–0.70)

## 2017-07-03 LAB — CBC
HCT: 30.7 % — ABNORMAL LOW (ref 39.0–52.0)
Hemoglobin: 10.1 g/dL — ABNORMAL LOW (ref 13.0–17.0)
MCH: 34.1 pg — AB (ref 26.0–34.0)
MCHC: 32.9 g/dL (ref 30.0–36.0)
MCV: 103.7 fL — AB (ref 78.0–100.0)
PLATELETS: 189 10*3/uL (ref 150–400)
RBC: 2.96 MIL/uL — ABNORMAL LOW (ref 4.22–5.81)
RDW: 14.8 % (ref 11.5–15.5)
WBC: 9.4 10*3/uL (ref 4.0–10.5)

## 2017-07-03 MED ORDER — TRAMADOL HCL 50 MG PO TABS
50.0000 mg | ORAL_TABLET | Freq: Four times a day (QID) | ORAL | Status: DC | PRN
  Administered 2017-07-03: 50 mg via ORAL
  Filled 2017-07-03: qty 1

## 2017-07-03 MED ORDER — MORPHINE SULFATE (PF) 2 MG/ML IV SOLN
2.0000 mg | INTRAVENOUS | Status: DC | PRN
  Administered 2017-07-03 – 2017-07-08 (×7): 2 mg via INTRAVENOUS
  Filled 2017-07-03 (×7): qty 1

## 2017-07-03 MED ORDER — WARFARIN SODIUM 7.5 MG PO TABS
15.0000 mg | ORAL_TABLET | Freq: Once | ORAL | Status: AC
  Administered 2017-07-03: 15 mg via ORAL
  Filled 2017-07-03: qty 2

## 2017-07-03 NOTE — Progress Notes (Signed)
Progress Note  Patient Name: Frank [name] Date of Encounter: 07/03/2017  Primary Cardiologist: Dr. [name]  Subjective  Feeling pretty well.  Getting a little bit stronger.  Not quite sure he wants to go home before device reimplanted.  Inpatient Medications    Scheduled Meds: . aspirin EC  81 mg Oral Daily  . cephALEXin  500 mg Oral QID  .  HYDROmorphone (DILAUDID) injection  1 mg Intravenous Once  . losartan  100 mg Oral Daily  . magnesium oxide  200 mg Oral TID  . multivitamin with minerals  1 tablet Oral Daily  . omega-3 acid ethyl esters  1 g Oral BID  . spironolactone  25 mg Oral Daily  . torsemide  40 mg Oral BID AC  . vitamin C  2,000 mg Oral Daily  . warfarin  10 mg Oral q1800  . Warfarin - Pharmacist Dosing Inpatient   Does not apply q1800   . heparin 2,000 Units/hr (07/03/17 0051)   PRN Meds: acetaminophen, acetaminophen **AND** diphenhydrAMINE, ondansetron (ZOFRAN) IV   Vital Signs    Vitals:   07/02/17 1452 07/02/17 2055 07/02/17 2203 07/03/17 0626  BP: (!) 97/46 (!) 107/36 107/87 (!) 101/48  Pulse: 70 70 70 70  Resp: 20 19  17   Temp: 98.9 F (37.2 C) 98.3 F (36.8 C)  98.4 F (36.9 C)  TempSrc: Oral Oral  Oral  SpO2: 96% 90%  93%  Weight:    (!) 332 lb 4.8 oz (150.7 kg)  Height:        Intake/Output Summary (Last 24 hours) at 07/03/2017 0733 Last data filed at 07/03/2017 0650 Gross per 24 hour  Intake 1591.45 ml  Output 1950 ml  Net -358.55 ml   Filed Weights   07/01/17 0450 07/02/17 0439 07/03/17 0626  Weight: (!) 329 lb 9.6 oz (149.5 kg) (!) 331 lb 11.2 oz (150.5 kg) (!) 332 lb 4.8 oz (150.7 kg)    Telemetry    V pacing .  Personally reviewed.  ECG       Physical Exam  Well developed and nourished in no acute distress HENT normal Neck supple with JVP-flat Clear Regular rate and rhythm, no murmurs or gallops Abd-soft with active BS No Clubbing cyanosis edema Skin-warm and dry A & Oriented  Grossly normal sensory  and motor function    Left chest loose dressing in place, dry/clean Temp-perm L IJ site, tegaderm in place  Labs    Chemistry Recent Labs  Lab 06/26/17 1456 06/29/17 2209 07/01/17 0712 07/02/17 0436  NA 141  --  135 131*  K 4.5  --  3.8 3.9  CL 101  --  99* 95*  CO2 24  --  27 26  GLUCOSE 108*  --  103* 113*  BUN 23  --  21* 29*  CREATININE 1.04 0.99 1.32* 1.28*  CALCIUM 9.5  --  8.8* 8.4*  GFRNONAA 73 >60 53* 55*  GFRAA 84 >60 >60 >60  ANIONGAP  --   --  9 10     Hematology Recent Labs  Lab 06/26/17 1456 06/29/17 2209 07/02/17 0436  WBC 7.8 15.4* 11.7*  RBC 3.72* 3.40* 2.91*  HGB 12.3* 11.5* 10.0*  HCT 37.4* 35.7* 30.4*  MCV 101* 105.0* 104.5*  MCH 33.1* 33.8 34.4*  MCHC 32.9 32.2 32.9  RDW 14.3 14.4 14.9  PLT 221 177 173    INR 1.39 yday  Repeat is still pending   Radiology    No  results found.  Cardiac Studies   Echocardiogram 01/18/2017: Study Conclusions  - Left ventricle: The cavity size was normal. Septal wall thickness   was increased in a pattern of severe LVH with otherwise mild   concentric hypertrophy. Systolic function was mildly reduced. The   estimated ejection fraction was in the range of 45% to 50%.   Hypokinesis of the anteroseptal, inferoseptal, and apical   myocardium. The study is not technically sufficient to allow   evaluation of LV diastolic function. - Aortic valve: There was very mild stenosis. There was mild   regurgitation. Valve area (VTI): 3.42 cm^2. Valve area (Vmax):   3.58 cm^2. Valve area (Vmean): 3.49 cm^2. - Mitral valve: A mechanical prosthesis was present. Transvalvular   velocity was within the normal range. There was no evidence for   stenosis. There was no regurgitation. Valve area by continuity   equation (using LVOT flow): 2.49 cm^2. - Left atrium: The atrium was moderately dilated. - Right ventricle: The cavity size was severely dilated. Wall   thickness was normal. Systolic function was moderately  reduced. - Tricuspid valve: There was mild regurgitation. - Pulmonary arteries: Systolic pressure was within the normal   range. PA peak pressure: 22 mm Hg (S).  Impressions:  - Mechanical mitral valve is functioning properly. Gradients   unchanged from 07/2015.  Patient Profile    72 y.o. male with a history of hypertrophic cardiomyopathy status post septal myomectomy and mechanical MVR with aortic valve repair.  He recently underwent explantation of a [employer] CRT-P due to device infection.  Currently with temporary/permanent device in place and plan for permanent device implantation later this week.  Assessment & Plan    1.  Pacemaker site infection status post explantation of [employer]. [employer] CRT-P last week.     2.  Hypertrophic cardiomyopathy status post septal myomectomy.         Last echocardiogram from October 2018 is outlined above.  3.  Paroxysmal atrial fibrillation   4. Mechanical MVR     Pharmacy for heparin gtt bridge to therapeutic INR    Plan has been to discharge once INR therapeutic with reimplantation probably next week Discussed with DR GT

## 2017-07-03 NOTE — Progress Notes (Signed)
[place] for heparin>>warfarin Indication: Mechanical MV, atrial fibrillation  Allergies  Allergen Reactions  . Lovenox [Enoxaparin Sodium]     Causes large lumps at injection site  . Warfarin And Related Other (See Comments)    Per the patient's wife, "Warfain made him have a TIA" CAN TAKE BRAND NAME COUMADIN     Patient Measurements: Height: 5\' 11"  (180.3 cm) Weight: (!) 332 lb 4.8 oz (150.7 kg) IBW/kg (Calculated) : 75.3 Heparin Dosing Weight: 110kg  Vital Signs: Temp: 98.5 F (36.9 C) (03/26 1400) Temp Source: Oral (03/26 1400) BP: 101/34 (03/26 1623) Pulse Rate: 70 (03/26 1400)  Labs: Recent Labs    07/01/17 0712 07/02/17 0436  07/02/17 2335 07/03/17 0653 07/03/17 1627  HGB  --  10.0*  --   --  10.1*  --   HCT  --  30.4*  --   --  30.7*  --   PLT  --  173  --   --  189  --   LABPROT 17.7* 16.9*  --   --  16.4*  --   INR 1.47 1.39  --   --  1.34  --   HEPARINUNFRC  --   --    < > 0.17* 0.18* 0.22*  CREATININE 1.32* 1.28*  --   --  1.18  --    < > = values in this interval not displayed.    Estimated Creatinine Clearance: 88.2 mL/min (by C-G formula based on SCr of 1.18 mg/dL).   Assessment: 72 year old male readmitted for pacemaker exchange after developing pocket infection. BiV pacer removed 3/22, temp pacer in place.   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 New consult received this morning to start IV heparin given mechanical valve, continue warfarin with assistance from pharmacy. Given recent surgery and expectation of another surgery, EP asked that heparin dosing be conservative and that we avoid boluses.  Heparin level is still low but up to 0.22 this afternoon. Level was only ~5 hrs after rate increase so may not have had full effect. No bleeding noted. INR relatively unchanged overnight at 1.34.  Goal of Therapy:  Heparin level ~0.3  units/ml Monitor platelets by anticoagulation protocol: Yes   Plan:  Increase heparin drip to 2,450 units/hr  6 hr heparin level Daily heparin level, CBC Monitor for s/sx of bleeding Given plan to likely send patient home when INR >2 will start to be more aggressive with warfarin dosing, will give 15mg  tonight.   Elenor Quinones, PharmD, BCPS Clinical Pharmacist Pager [phone] 07/03/2017 6:02 PM

## 2017-07-03 NOTE — Progress Notes (Signed)
[place] for heparin>>warfarin Indication: Mechanical MV, atrial fibrillation  Allergies  Allergen Reactions  . Lovenox [Enoxaparin Sodium]     Causes large lumps at injection site  . Warfarin And Related Other (See Comments)    Per the patient's wife, "Warfain made him have a TIA" CAN TAKE BRAND NAME COUMADIN     Patient Measurements: Height: 5\' 11"  (180.3 cm) Weight: (!) 332 lb 4.8 oz (150.7 kg) IBW/kg (Calculated) : 75.3 Heparin Dosing Weight: 110kg  Vital Signs: Temp: 98.4 F (36.9 C) (03/26 0626) Temp Source: Oral (03/26 0626) BP: 101/48 (03/26 0626) Pulse Rate: 70 (03/26 0626)  Labs: Recent Labs    07/01/17 0712 07/02/17 0436 07/02/17 1604 07/02/17 2335  HGB  --  10.0*  --   --   HCT  --  30.4*  --   --   PLT  --  173  --   --   LABPROT 17.7* 16.9*  --   --   INR 1.47 1.39  --   --   HEPARINUNFRC  --   --  <0.10* 0.17*  CREATININE 1.32* 1.28*  --   --     Estimated Creatinine Clearance: 81.3 mL/min (A) (by C-G formula based on SCr of 1.28 mg/dL (H)).   Assessment: 72 year old male readmitted for pacemaker exchange after developing pocket infection. BiV pacer removed 3/22, temp pacer in place.   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 New consult received this morning to start IV heparin given mechanical valve, continue warfarin with assistance from pharmacy. Given recent surgery and expectation of another surgery, EP asked that heparin dosing be conservative and that we avoid boluses.  Heparin level is still low this am at 0.18 on 2000 units/hr. No bleeding noted. INR relatively unchanged overnight at 1.34.  Goal of Therapy:  Heparin level ~0.3 units/ml Monitor platelets by anticoagulation protocol: Yes   Plan:  Increase heparin drip to 2300 units/hr  6 hr heparin level Daily heparin level, CBC Monitor for s/sx of bleeding Given plan to likely  send patient home when INR >2 will start to be more aggressive with warfarin dosing, will give 15mg  tonight.   Erin Hearing PharmD., BCPS Clinical Pharmacist 07/03/2017 8:09 AM

## 2017-07-03 NOTE — Progress Notes (Signed)
ANTICOAGULATION CONSULT NOTE - Follow Up Consult  Pharmacy Consult for heparin Indication: MVR and Afib  Labs: Recent Labs    07/01/17 0712 07/02/17 0436 07/02/17 1604 07/02/17 2335  HGB  --  10.0*  --   --   HCT  --  30.4*  --   --   PLT  --  173  --   --   LABPROT 17.7* 16.9*  --   --   INR 1.47 1.39  --   --   HEPARINUNFRC  --   --  <0.10* 0.17*  CREATININE 1.32* 1.28*  --   --      Assessment: 72yo male subtherapeutic on heparin after rate change though closer to goal.  Goal of Therapy:  Heparin level ~0.3 units/ml   Plan:  Will increase heparin gtt by 2 units/kgABW/hr to 2000 units/hr and check level in 6 hours.    Wynona Neat, PharmD, BCPS  07/03/2017,12:31 AM

## 2017-07-04 ENCOUNTER — Encounter (HOSPITAL_COMMUNITY): Payer: Self-pay | Admitting: Internal Medicine

## 2017-07-04 LAB — CBC
HEMATOCRIT: 31.2 % — AB (ref 39.0–52.0)
Hemoglobin: 10.5 g/dL — ABNORMAL LOW (ref 13.0–17.0)
MCH: 34.7 pg — ABNORMAL HIGH (ref 26.0–34.0)
MCHC: 33.7 g/dL (ref 30.0–36.0)
MCV: 103 fL — ABNORMAL HIGH (ref 78.0–100.0)
PLATELETS: 226 10*3/uL (ref 150–400)
RBC: 3.03 MIL/uL — ABNORMAL LOW (ref 4.22–5.81)
RDW: 14.6 % (ref 11.5–15.5)
WBC: 9.5 10*3/uL (ref 4.0–10.5)

## 2017-07-04 LAB — BASIC METABOLIC PANEL
Anion gap: 10 (ref 5–15)
BUN: 32 mg/dL — AB (ref 6–20)
CO2: 28 mmol/L (ref 22–32)
Calcium: 8.9 mg/dL (ref 8.9–10.3)
Chloride: 96 mmol/L — ABNORMAL LOW (ref 101–111)
Creatinine, Ser: 1.18 mg/dL (ref 0.61–1.24)
Glucose, Bld: 101 mg/dL — ABNORMAL HIGH (ref 65–99)
POTASSIUM: 4 mmol/L (ref 3.5–5.1)
SODIUM: 134 mmol/L — AB (ref 135–145)

## 2017-07-04 LAB — PROTIME-INR
INR: 1.24
Prothrombin Time: 15.5 seconds — ABNORMAL HIGH (ref 11.4–15.2)

## 2017-07-04 LAB — HEPARIN LEVEL (UNFRACTIONATED): Heparin Unfractionated: 0.32 IU/mL (ref 0.30–0.70)

## 2017-07-04 MED ORDER — WARFARIN SODIUM 7.5 MG PO TABS
15.0000 mg | ORAL_TABLET | Freq: Once | ORAL | Status: AC
  Administered 2017-07-04: 15 mg via ORAL
  Filled 2017-07-04: qty 2

## 2017-07-04 MED ORDER — HEPARIN (PORCINE) IN NACL 100-0.45 UNIT/ML-% IJ SOLN
2250.0000 [IU]/h | INTRAMUSCULAR | Status: DC
  Administered 2017-07-04 (×2): 2400 [IU]/h via INTRAVENOUS
  Administered 2017-07-05: 2350 [IU]/h via INTRAVENOUS
  Administered 2017-07-06: 2250 [IU]/h via INTRAVENOUS
  Administered 2017-07-06: 2350 [IU]/h via INTRAVENOUS
  Administered 2017-07-07 – 2017-07-08 (×3): 2250 [IU]/h via INTRAVENOUS
  Filled 2017-07-04 (×10): qty 250

## 2017-07-04 NOTE — Progress Notes (Addendum)
Electrophysiology Rounding Note  Patient Name: Frank [name] [facility] Date of Encounter: 07/04/2017  Electrophysiologist: [name]   Subjective   The patient has significant pocket pain today.  He denies chest pain or shortness of breath. Pocket hematoma developed last night and Heparin was held.  He has had drainage overnight.  +sweats last night, but he also has these at home occasionally. No fever  Inpatient Medications    Scheduled Meds: . aspirin EC  81 mg Oral Daily  . cephALEXin  500 mg Oral QID  .  HYDROmorphone (DILAUDID) injection  1 mg Intravenous Once  . losartan  100 mg Oral Daily  . magnesium oxide  200 mg Oral TID  . multivitamin with minerals  1 tablet Oral Daily  . omega-3 acid ethyl esters  1 g Oral BID  . spironolactone  25 mg Oral Daily  . torsemide  40 mg Oral BID AC  . vitamin C  2,000 mg Oral Daily  . Warfarin - Pharmacist Dosing Inpatient   Does not apply q1800   Continuous Infusions: . heparin Stopped (07/03/17 2305)   PRN Meds: acetaminophen, acetaminophen **AND** diphenhydrAMINE, morphine injection, ondansetron (ZOFRAN) IV, traMADol   Vital Signs    Vitals:   07/03/17 1400 07/03/17 1623 07/03/17 1936 07/04/17 0641  BP: (!) 94/47 (!) 101/34 104/67 120/64  Pulse: 70  70 70  Resp: 20  19 17   Temp: 98.5 F (36.9 C)  97.9 F (36.6 C) 97.7 F (36.5 C)  TempSrc: Oral  Oral Oral  SpO2: 95%  93% 94%  Weight:    (!) 332 lb 11.2 oz (150.9 kg)  Height:        Intake/Output Summary (Last 24 hours) at 07/04/2017 0816 Last data filed at 07/04/2017 0645 Gross per 24 hour  Intake 2164.41 ml  Output 1950 ml  Net 214.41 ml   Filed Weights   07/02/17 0439 07/03/17 0626 07/04/17 0641  Weight: (!) 331 lb 11.2 oz (150.5 kg) (!) 332 lb 4.8 oz (150.7 kg) (!) 332 lb 11.2 oz (150.9 kg)    Physical Exam    GEN- The patient is obese and chronically ill appearing, alert and oriented x 3 today.   Head- normocephalic, atraumatic Eyes-  Sclera clear, conjunctiva  pink Ears- hearing intact Oropharynx- clear Neck- supple, temp-perm pacemaker left IJ Lungs- Clear to ausculation bilaterally, normal work of breathing Heart- Regular rate and rhythm (paced) GI- soft, NT, ND, + BS Extremities- no clubbing, cyanosis, or edema Skin- no rash or lesion, +device extraction site hematoma with serosanguious drainage from medial part of incision Psych- euthymic mood, full affect Neuro- strength and sensation are intact  Labs    CBC Recent Labs    07/03/17 0653 07/04/17 0530  WBC 9.4 9.5  HGB 10.1* 10.5*  HCT 30.7* 31.2*  MCV 103.7* 103.0*  PLT 189 387   Basic Metabolic Panel Recent Labs    07/03/17 0653 07/04/17 0530  NA 132* 134*  K 3.7 4.0  CL 94* 96*  CO2 26 28  GLUCOSE 97 101*  BUN 32* 32*  CREATININE 1.18 1.18  CALCIUM 8.6* 8.9    Telemetry    Ventricular pacing  (personally reviewed)  Radiology    No results found.   Patient Profile     [name] is a 72 y.o. male admitted for device system extraction 2/2 pocket infection. Now with temp perm pacemaker while awaiting therapeutic INR and device re-implant.    Assessment & Plan  1.  Pocket infection s/p device system extraction Now with pocket hematoma (new since yesterday) and significant pain  Will continue supportive care No objective evidence of ongoing infection  2.  Mechanical MVR Resume IV Heparin per Dr [name]  Continue Warfarin   Dr [name] to see later today   Signed, Chanetta Marshall, NP  07/04/2017, 8:16 AM   INR 1.24  With drainage will place pressure dressing  Continue heparin and coumadin per pharmacy Reviewed with patient

## 2017-07-04 NOTE — Progress Notes (Signed)
[name] for heparin>>warfarin Indication: Mechanical MV, atrial fibrillation  Allergies  Allergen Reactions  . Lovenox [Enoxaparin Sodium]     Causes large lumps at injection site  . Warfarin And Related Other (See Comments)    Per the patient's wife, "Warfain made him have a TIA" CAN TAKE BRAND NAME COUMADIN     Patient Measurements: Height: 5\' 11"  (180.3 cm) Weight: (!) 332 lb 11.2 oz (150.9 kg) IBW/kg (Calculated) : 75.3 Heparin Dosing Weight: 110kg  Vital Signs: Temp: 97.9 F (36.6 C) (03/27 1422) Temp Source: Oral (03/27 1422) BP: 102/52 (03/27 1422) Pulse Rate: 70 (03/27 1422)  Labs: Recent Labs    07/02/17 0436  07/03/17 0653 07/03/17 1627 07/04/17 0530 07/04/17 1801  HGB 10.0*  --  10.1*  --  10.5*  --   HCT 30.4*  --  30.7*  --  31.2*  --   PLT 173  --  189  --  226  --   LABPROT 16.9*  --  16.4*  --  15.5*  --   INR 1.39  --  1.34  --  1.24  --   HEPARINUNFRC  --    < > 0.18* 0.22*  --  0.32  CREATININE 1.28*  --  1.18  --  1.18  --    < > = values in this interval not displayed.    Estimated Creatinine Clearance: 88.2 mL/min (by C-G formula based on SCr of 1.18 mg/dL).   Assessment: 72 year old male readmitted for pacemaker exchange after developing pocket infection. BiV pacer removed 3/22, temp pacer in place.   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 New consult received this morning to start IV heparin given mechanical valve, continue warfarin with assistance from pharmacy. Given recent surgery and expectation of another surgery, EP asked that heparin dosing be conservative and that we avoid boluses.  Heparin resumed this AM after held for pacer site hematoma overnight. Heparin level at goal tonight. No changes needed.   Goal of Therapy:  Heparin level ~0.3 units/ml Monitor platelets by anticoagulation protocol: Yes   Plan:  Continue  heparin at 2400 units / hr Daily heparin level, CBC, INR Monitor for s/sx of bleeding  Thank you, Erin Hearing PharmD., BCPS Clinical Pharmacist 07/04/2017 8:05 PM

## 2017-07-04 NOTE — Plan of Care (Signed)
  Problem: Activity: Goal: Risk for activity intolerance will decrease Outcome: Progressing  Pt ambulates with steady gait with standby assistance and front wheel walker.

## 2017-07-04 NOTE — Progress Notes (Signed)
[place] for heparin>>warfarin Indication: Mechanical MV, atrial fibrillation  Allergies  Allergen Reactions  . Lovenox [Enoxaparin Sodium]     Causes large lumps at injection site  . Warfarin And Related Other (See Comments)    Per the patient's wife, "Warfain made him have a TIA" CAN TAKE BRAND NAME COUMADIN     Patient Measurements: Height: 5\' 11"  (180.3 cm) Weight: (!) 332 lb 11.2 oz (150.9 kg) IBW/kg (Calculated) : 75.3 Heparin Dosing Weight: 110kg  Vital Signs: Temp: 97.7 F (36.5 C) (03/27 0641) Temp Source: Oral (03/27 0641) BP: 120/64 (03/27 0641) Pulse Rate: 70 (03/27 0641)  Labs: Recent Labs    07/02/17 0436  07/02/17 2335 07/03/17 0653 07/03/17 1627 07/04/17 0530  HGB 10.0*  --   --  10.1*  --  10.5*  HCT 30.4*  --   --  30.7*  --  31.2*  PLT 173  --   --  189  --  226  LABPROT 16.9*  --   --  16.4*  --  15.5*  INR 1.39  --   --  1.34  --  1.24  HEPARINUNFRC  --    < > 0.17* 0.18* 0.22*  --   CREATININE 1.28*  --   --  1.18  --  1.18   < > = values in this interval not displayed.    Estimated Creatinine Clearance: 88.2 mL/min (by C-G formula based on SCr of 1.18 mg/dL).   Assessment: 72 year old male readmitted for pacemaker exchange after developing pocket infection. BiV pacer removed 3/22, temp pacer in place.   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 New consult received this morning to start IV heparin given mechanical valve, continue warfarin with assistance from pharmacy. Given recent surgery and expectation of another surgery, EP asked that heparin dosing be conservative and that we avoid boluses.  Heparin to resume this AM after held for pacer site hematoma overnight CBC stable for now INR=1.24  Goal of Therapy:  Heparin level ~0.3 units/ml Monitor platelets by anticoagulation protocol: Yes   Plan:  Restart heparin at 2400 units /  hr 8 hr heparin level Daily heparin level, CBC, INR Monitor for s/sx of bleeding Repeat Warfarin 15 mg po x 1 tonight  Thank you Anette Guarneri, PharmD [phone] 07/04/2017 8:54 AM

## 2017-07-04 NOTE — Plan of Care (Signed)
  Problem: Clinical Measurements: Goal: Respiratory complications will improve Outcome: Progressing  Pt maintains oxygen saturation levels above 95% on room air. Dyspnea on exertion

## 2017-07-04 NOTE — Progress Notes (Signed)
Was called to bedside by nurse.  The patient ICD extraction followed by packing yesterday.  Packing was removed today.  Heparin was started yesterday and warfarin was restarted yesterday.  Then this afternoon it was noted that the pocket site is increasing in size and patient had increasing pain on that side.  Physical exam: Raised pocket.  Temporary take in place no blood is extravasating from the pocket.  Plan: We will hold heparin for now.  Will discuss with morning team and resume.  She has mechanical mitral valve in place.  Nursing seems to contact the medical provider team by 7 AM to discuss plan for heparin.  In the meantime we will treat pain with tramadol.  Ramzy Cappelletti

## 2017-07-05 LAB — BASIC METABOLIC PANEL
Anion gap: 10 (ref 5–15)
BUN: 29 mg/dL — AB (ref 6–20)
CHLORIDE: 96 mmol/L — AB (ref 101–111)
CO2: 29 mmol/L (ref 22–32)
CREATININE: 1.09 mg/dL (ref 0.61–1.24)
Calcium: 8.6 mg/dL — ABNORMAL LOW (ref 8.9–10.3)
GFR calc Af Amer: >60 mL/min (ref >60)
GFR calc non Af Amer: >60 mL/min (ref >60)
GLUCOSE: 103 mg/dL — AB (ref 65–99)
Potassium: 4.2 mmol/L (ref 3.5–5.1)
SODIUM: 135 mmol/L (ref 135–145)

## 2017-07-05 LAB — PROTIME-INR
INR: 1.49
Prothrombin Time: 17.9 seconds — ABNORMAL HIGH (ref 11.4–15.2)

## 2017-07-05 LAB — CBC
HEMATOCRIT: 30.4 % — AB (ref 39.0–52.0)
Hemoglobin: 9.9 g/dL — ABNORMAL LOW (ref 13.0–17.0)
MCH: 33.7 pg (ref 26.0–34.0)
MCHC: 32.6 g/dL (ref 30.0–36.0)
MCV: 103.4 fL — AB (ref 78.0–100.0)
PLATELETS: 241 10*3/uL (ref 150–400)
RBC: 2.94 MIL/uL — ABNORMAL LOW (ref 4.22–5.81)
RDW: 14.6 % (ref 11.5–15.5)
WBC: 8.7 10*3/uL (ref 4.0–10.5)

## 2017-07-05 LAB — HEPARIN LEVEL (UNFRACTIONATED): Heparin Unfractionated: 0.48 IU/mL (ref 0.30–0.70)

## 2017-07-05 MED ORDER — WARFARIN SODIUM 2.5 MG PO TABS
12.5000 mg | ORAL_TABLET | Freq: Once | ORAL | Status: AC
  Administered 2017-07-05: 12.5 mg via ORAL
  Filled 2017-07-05: qty 1

## 2017-07-05 NOTE — Care Management Note (Signed)
Case Management Note  Patient Details  Name: Wyatt Thorstenson MRN: [id number] Date of Birth: [birth year]  Subjective/Objective:  Pt presented for device system extraction 2/2 pocket infection. Pt with a temporary pacemaker-awaiting therapeutic INR.                 Action/Plan: Pt continues on IV Heparin gtt. Coumadin per Pharmacy. NO home needs identified at this time.   Expected Discharge Date:                  Expected Discharge Plan:  Home/Self Care  In-House Referral:  Clinical Social Work  Discharge planning Services  CM Consult  Post Acute Care Choice:  NA Choice offered to:  NA  DME Arranged:  N/A DME Agency:  NA  HH Arranged:  NA HH Agency:  NA  Status of Service:  Completed, signed off  If discussed at [place] of Stay Meetings, dates discussed:    Additional Comments:  Bethena Roys, RN 07/05/2017, 4:13 PM

## 2017-07-05 NOTE — Progress Notes (Addendum)
Electrophysiology Rounding Note  Patient Name: Frank [name] [facility] Date of Encounter: 07/05/2017  Electrophysiologist: [name]   Subjective   The patient is doing well today.  At this time, the patient denies chest pain, shortness of breath, or any new concerns.  Inpatient Medications    Scheduled Meds: . aspirin EC  81 mg Oral Daily  . cephALEXin  500 mg Oral QID  .  HYDROmorphone (DILAUDID) injection  1 mg Intravenous Once  . losartan  100 mg Oral Daily  . magnesium oxide  200 mg Oral TID  . multivitamin with minerals  1 tablet Oral Daily  . omega-3 acid ethyl esters  1 g Oral BID  . spironolactone  25 mg Oral Daily  . torsemide  40 mg Oral BID AC  . vitamin C  2,000 mg Oral Daily  . warfarin  12.5 mg Oral ONCE-1800  . Warfarin - Pharmacist Dosing Inpatient   Does not apply q1800   Continuous Infusions: . heparin 2,350 Units/hr (07/05/17 0828)   PRN Meds: acetaminophen, acetaminophen **AND** diphenhydrAMINE, morphine injection, ondansetron (ZOFRAN) IV, traMADol   Vital Signs    Vitals:   07/04/17 1422 07/04/17 2043 07/05/17 0659 07/05/17 0700  BP: (!) 102/52 (!) 111/42 (!) 118/54   Pulse: 70 70 70   Resp:  (!) 24 (!) 22   Temp: 97.9 F (36.6 C) 97.7 F (36.5 C) 98.7 F (37.1 C)   TempSrc: Oral Oral Oral   SpO2: 93% 93% 92%   Weight:    (!) 330 lb 9.6 oz (150 kg)  Height:        Intake/Output Summary (Last 24 hours) at 07/05/2017 1002 Last data filed at 07/05/2017 0643 Gross per 24 hour  Intake 1233.6 ml  Output 1225 ml  Net 8.6 ml   Filed Weights   07/03/17 0626 07/04/17 0641 07/05/17 0700  Weight: (!) 332 lb 4.8 oz (150.7 kg) (!) 332 lb 11.2 oz (150.9 kg) (!) 330 lb 9.6 oz (150 kg)    Physical Exam    GEN- The patient is obese appearing, alert and oriented x 3 today.   Head- normocephalic, atraumatic Eyes-  Sclera clear, conjunctiva pink Ears- hearing intact Oropharynx- clear Neck- supple, +temp-perm PPM LIJ Lungs- Clear to ausculation  bilaterally, normal work of breathing Heart- Regular rate and rhythm (paced) GI- soft, NT, ND, + BS Extremities- no clubbing, cyanosis, or edema Skin- no rash or lesion, hematoma resolved, +ecchymosis  Psych- euthymic mood, full affect Neuro- strength and sensation are intact  Labs    CBC Recent Labs    07/04/17 0530 07/05/17 0455  WBC 9.5 8.7  HGB 10.5* 9.9*  HCT 31.2* 30.4*  MCV 103.0* 103.4*  PLT 226 683   Basic Metabolic Panel Recent Labs    07/04/17 0530 07/05/17 0455  NA 134* 135  K 4.0 4.2  CL 96* 96*  CO2 28 29  GLUCOSE 101* 103*  BUN 32* 29*  CREATININE 1.18 1.09  CALCIUM 8.9 8.6*   Liver Function Tests No results for input(s): AST, ALT, ALKPHOS, BILITOT, PROT, ALBUMIN in the last 72 hours. No results for input(s): LIPASE, AMYLASE in the last 72 hours. Cardiac Enzymes No results for input(s): CKTOTAL, CKMB, CKMBINDEX, TROPONINI in the last 72 hours. BNP Invalid input(s): POCBNP D-Dimer No results for input(s): DDIMER in the last 72 hours. Hemoglobin A1C No results for input(s): HGBA1C in the last 72 hours. Fasting Lipid Panel No results for input(s): CHOL, HDL, LDLCALC, TRIG, CHOLHDL, LDLDIRECT in the  last 72 hours. Thyroid Function Tests No results for input(s): TSH, T4TOTAL, T3FREE, THYROIDAB in the last 72 hours.  Invalid input(s): FREET3  Telemetry    V paced (personally reviewed)  Radiology    No results found.   Patient Profile     Frank [name] is a 72 y.o. male admitted for device system extraction 2/2 pocket infection. Now with temp perm pacemaker while awaiting therapeutic INR and device re-implant.    Assessment & Plan    1.  Device pocket infection 2/2 extraction Hematoma improved from yesterday Plan right sided re-implant in a couple of weeks  2.  Mechanical MVR Continue IV Heparin until INR therapeutic Continue Warfarin per pharmacy  Hopefully home in next couple of days   Signed, Chanetta Marshall, NP  07/05/2017,  10:02 AM   EP attending  Agree with above.  He remains subtherapeutic with regard to his INR.  With his mechanical mitral valve, he will be required to stay in the hospital for heparin therapy.  Once he is systemically anticoagulated with warfarin, he can be discharged home.  At this point his ICD pocket has continued to have some drainage.  We will like to see some healing prior to reimplantation of a new device.  Of note the patient has a temporary permanent transvenous pacemaker in place which is working normally.  He does have an underlying escape rhythm, albeit slow.  Frank Peru, MD

## 2017-07-05 NOTE — Plan of Care (Signed)
Afebrile. WBC's WNL. Denies pain. Left CW dressing CD and Intact.

## 2017-07-05 NOTE — Care Management Important Message (Signed)
Important Message  Patient Details  Name: Frank [name] MRN: [id number] Date of Birth: 05/21/1945   Medicare Important Message Given:  Yes    [name] [name] 07/05/2017, 1:12 PM

## 2017-07-05 NOTE — Progress Notes (Signed)
[place] for heparin>>warfarin Indication: Mechanical MV, atrial fibrillation  Allergies  Allergen Reactions  . Lovenox [Enoxaparin Sodium]     Causes large lumps at injection site  . Warfarin And Related Other (See Comments)    Per the patient's wife, "Warfain made him have a TIA" CAN TAKE BRAND NAME COUMADIN     Patient Measurements: Height: 5\' 11"  (180.3 cm) Weight: (!) 330 lb 9.6 oz (150 kg) IBW/kg (Calculated) : 75.3 Heparin Dosing Weight: 110kg  Vital Signs: Temp: 98.7 F (37.1 C) (03/28 0659) Temp Source: Oral (03/28 0659) BP: 118/54 (03/28 0659) Pulse Rate: 70 (03/28 0659)  Labs: Recent Labs    07/03/17 0653 07/03/17 1627 07/04/17 0530 07/04/17 1801 07/05/17 0455  HGB 10.1*  --  10.5*  --  9.9*  HCT 30.7*  --  31.2*  --  30.4*  PLT 189  --  226  --  241  LABPROT 16.4*  --  15.5*  --  17.9*  INR 1.34  --  1.24  --  1.49  HEPARINUNFRC 0.18* 0.22*  --  0.32 0.48  CREATININE 1.18  --  1.18  --  1.09    Estimated Creatinine Clearance: 95.2 mL/min (by C-G formula based on SCr of 1.09 mg/dL).   Assessment: 72 year old male readmitted for pacemaker exchange after developing pocket infection. BiV pacer removed 3/22, temp pacer in place.   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 New consult received this morning to start IV heparin given mechanical valve, continue warfarin with assistance from pharmacy. Given recent surgery and expectation of another surgery, EP asked that heparin dosing be conservative and that we avoid boluses.  Heparin level = 0.48 CBC stable for now INR=1.49  Goal of Therapy:  Heparin level ~0.3 units/ml Monitor platelets by anticoagulation protocol: Yes   Plan:  Decrease heparin to 2350 units / hr Warfarin 12.5 mg po x 1 tonight Daily heparin level, CBC, INR Monitor for s/sx of bleeding  Thank you Anette Guarneri,  PharmD [phone] 07/05/2017 8:00 AM

## 2017-07-06 LAB — CBC
HCT: 31.6 % — ABNORMAL LOW (ref 39.0–52.0)
Hemoglobin: 10.2 g/dL — ABNORMAL LOW (ref 13.0–17.0)
MCH: 33.1 pg (ref 26.0–34.0)
MCHC: 32.3 g/dL (ref 30.0–36.0)
MCV: 102.6 fL — ABNORMAL HIGH (ref 78.0–100.0)
Platelets: 276 10*3/uL (ref 150–400)
RBC: 3.08 MIL/uL — AB (ref 4.22–5.81)
RDW: 14.2 % (ref 11.5–15.5)
WBC: 8 10*3/uL (ref 4.0–10.5)

## 2017-07-06 LAB — BASIC METABOLIC PANEL
Anion gap: 9 (ref 5–15)
BUN: 28 mg/dL — ABNORMAL HIGH (ref 6–20)
CALCIUM: 9.1 mg/dL (ref 8.9–10.3)
CHLORIDE: 98 mmol/L — AB (ref 101–111)
CO2: 29 mmol/L (ref 22–32)
CREATININE: 1.09 mg/dL (ref 0.61–1.24)
Glucose, Bld: 112 mg/dL — ABNORMAL HIGH (ref 65–99)
Potassium: 4.1 mmol/L (ref 3.5–5.1)
SODIUM: 136 mmol/L (ref 135–145)

## 2017-07-06 LAB — PROTIME-INR
INR: 1.62
PROTHROMBIN TIME: 19.1 s — AB (ref 11.4–15.2)

## 2017-07-06 LAB — HEPARIN LEVEL (UNFRACTIONATED): HEPARIN UNFRACTIONATED: 0.54 [IU]/mL (ref 0.30–0.70)

## 2017-07-06 MED ORDER — WARFARIN SODIUM 7.5 MG PO TABS
15.0000 mg | ORAL_TABLET | Freq: Once | ORAL | Status: AC
  Administered 2017-07-06: 15 mg via ORAL
  Filled 2017-07-06 (×2): qty 2

## 2017-07-06 NOTE — Progress Notes (Addendum)
Pt with INR still <2 Pocket stable Continue Heparin until INR therapeutic Hopefully discharge over the weekend. Early follow up with Dr [name] arranged to discuss timing of re-implantation.  Chanetta Marshall, NP 07/06/2017 7:14 PM   EP Attending  Patient seen and examined. Agree with above. He may be discharged home once his INR is therapeutic on warfarin.    Mikle Bosworth.D.

## 2017-07-06 NOTE — Plan of Care (Signed)
Pt anxiety over incision area and pain has decreased

## 2017-07-06 NOTE — Progress Notes (Addendum)
[place] for heparin>>warfarin Indication: Mechanical MV, atrial fibrillation  Allergies  Allergen Reactions  . Lovenox [Enoxaparin Sodium]     Causes large lumps at injection site  . Warfarin And Related Other (See Comments)    Per the patient's wife, "Warfain made him have a TIA" CAN TAKE BRAND NAME COUMADIN     Patient Measurements: Height: 5\' 11"  (180.3 cm) Weight: (!) 333 lb 9.6 oz (151.3 kg) IBW/kg (Calculated) : 75.3 Heparin Dosing Weight: 110kg  Vital Signs: Temp: 98 F (36.7 C) (03/29 0457) Temp Source: Oral (03/29 0457) BP: 119/45 (03/29 0640) Pulse Rate: 70 (03/29 0640)  Labs: Recent Labs    07/04/17 0530 07/04/17 1801 07/05/17 0455 07/06/17 0423  HGB 10.5*  --  9.9* 10.2*  HCT 31.2*  --  30.4* 31.6*  PLT 226  --  241 276  LABPROT 15.5*  --  17.9* 19.1*  INR 1.24  --  1.49 1.62  HEPARINUNFRC  --  0.32 0.48 0.54  CREATININE 1.18  --  1.09 1.09    Estimated Creatinine Clearance: 95.6 mL/min (by C-G formula based on SCr of 1.09 mg/dL).   Assessment: 72 year old male readmitted for pacemaker exchange after developing pocket infection. BiV pacer removed 3/22, temp pacer in place.   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 New consult received this morning to start IV heparin given mechanical valve, continue warfarin with assistance from pharmacy. Given recent surgery and expectation of another surgery, EP asked that heparin dosing be conservative and that we avoid boluses.  Heparin level = 0.54 (heparin level will trend up as INR increases) CBC stable  INR=1.49  Goal of Therapy:  Heparin level ~0.3 units/ml Monitor platelets by anticoagulation protocol: Yes  Goal INR = 2.5 to 3.5   Plan:  Decrease heparin to 2250 units / hr Warfarin 15 mg po x 1 tonight Daily heparin level, CBC, INR Monitor for s/sx of bleeding  Thank you Anette Guarneri,  PharmD [phone] 07/06/2017 8:47 AM

## 2017-07-07 DIAGNOSIS — Z952 Presence of prosthetic heart valve: Secondary | ICD-10-CM

## 2017-07-07 DIAGNOSIS — I442 Atrioventricular block, complete: Secondary | ICD-10-CM

## 2017-07-07 LAB — BASIC METABOLIC PANEL
ANION GAP: 11 (ref 5–15)
BUN: 24 mg/dL — ABNORMAL HIGH (ref 6–20)
CALCIUM: 9.4 mg/dL (ref 8.9–10.3)
CHLORIDE: 95 mmol/L — AB (ref 101–111)
CO2: 30 mmol/L (ref 22–32)
CREATININE: 1.06 mg/dL (ref 0.61–1.24)
GFR calc non Af Amer: >60 mL/min (ref >60)
Glucose, Bld: 100 mg/dL — ABNORMAL HIGH (ref 65–99)
Potassium: 4 mmol/L (ref 3.5–5.1)
SODIUM: 136 mmol/L (ref 135–145)

## 2017-07-07 LAB — CBC
HEMATOCRIT: 32.3 % — AB (ref 39.0–52.0)
Hemoglobin: 10.4 g/dL — ABNORMAL LOW (ref 13.0–17.0)
MCH: 33.2 pg (ref 26.0–34.0)
MCHC: 32.2 g/dL (ref 30.0–36.0)
MCV: 103.2 fL — ABNORMAL HIGH (ref 78.0–100.0)
PLATELETS: 300 10*3/uL (ref 150–400)
RBC: 3.13 MIL/uL — AB (ref 4.22–5.81)
RDW: 14.2 % (ref 11.5–15.5)
WBC: 8.9 10*3/uL (ref 4.0–10.5)

## 2017-07-07 LAB — PROTIME-INR
INR: 1.73
Prothrombin Time: 20.1 seconds — ABNORMAL HIGH (ref 11.4–15.2)

## 2017-07-07 LAB — GLUCOSE, CAPILLARY: Glucose-Capillary: 97 mg/dL (ref 65–99)

## 2017-07-07 LAB — HEPARIN LEVEL (UNFRACTIONATED): HEPARIN UNFRACTIONATED: 0.35 [IU]/mL (ref 0.30–0.70)

## 2017-07-07 MED ORDER — WARFARIN SODIUM 7.5 MG PO TABS
15.0000 mg | ORAL_TABLET | Freq: Once | ORAL | Status: AC
  Administered 2017-07-07: 15 mg via ORAL
  Filled 2017-07-07: qty 2

## 2017-07-07 NOTE — Plan of Care (Signed)
Monitor heparin level and INR. Observe for s/s bleeding.

## 2017-07-07 NOTE — Progress Notes (Signed)
Called to room by pt and wife as they had noticed bloody drainage from pacer site. Old dressing with small area of guaze saturated. No sign of new hematoma. Small amount of bloody ooze noted from 1 suture site which resolved after holding pressure for a few minutes. New pressure dressing placed over site. Dr. Rayann Heman notified. Okay to continue anticoagulation therapy as ordered per Dr. Rayann Heman.

## 2017-07-07 NOTE — Progress Notes (Signed)
[place] for heparin>>warfarin Indication: Mechanical MV, atrial fibrillation  Allergies  Allergen Reactions  . Lovenox [Enoxaparin Sodium]     Causes large lumps at injection site  . Warfarin And Related Other (See Comments)    Per the patient's wife, "Warfain made him have a TIA" CAN TAKE BRAND NAME COUMADIN     Patient Measurements: Height: 5\' 11"  (180.3 cm) Weight: (!) 330 lb 4.8 oz (149.8 kg) IBW/kg (Calculated) : 75.3 Heparin Dosing Weight: 150 kg  Vital Signs: Temp: 97.7 F (36.5 C) (03/30 0354) Temp Source: Axillary (03/30 0354) BP: 149/66 (03/30 0354) Pulse Rate: 69 (03/30 0354)  Labs: Recent Labs    07/05/17 0455 07/06/17 0423 07/07/17 0421  HGB 9.9* 10.2* 10.4*  HCT 30.4* 31.6* 32.3*  PLT 241 276 300  LABPROT 17.9* 19.1* 20.1*  INR 1.49 1.62 1.73  HEPARINUNFRC 0.48 0.54 0.35  CREATININE 1.09 1.09 1.06    Estimated Creatinine Clearance: 97.8 mL/min (by C-G formula based on SCr of 1.06 mg/dL).   Assessment: 72 year old male readmitted for pacemaker exchange after developing pocket infection. BiV pacer removed 3/22, temp pacer in place.   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 IV heparin per pharmacy given mechanical valve, continue warfarin. Given recent surgery and expectation of another surgery, EP asked that heparin dosing be conservative and that we avoid boluses.  PTA warfarin dosing: 10 mg daily, except 5 mg on Wednesdays  Heparin level therapeutic at 0.35 INR subtherapeutic at 1.49 CBC stable   Goal of Therapy:  Heparin level ~0.3 units/ml Monitor platelets by anticoagulation protocol: Yes  Goal INR = 2.5 to 3.5   Plan:  Continue heparin at 2250 units / hr Warfarin 15 mg po x 1 tonight Daily heparin level, CBC, INR Monitor for s/sx of bleeding  Angus Seller, PharmD Pharmacy Resident Clinical Phone for 07/07/2017 until 3:30pm:  [phone] If after 3:30pm, please call main pharmacy at [phone] 07/07/2017 8:04 AM

## 2017-07-07 NOTE — Progress Notes (Signed)
SUBJECTIVE: The patient is doing reasonably well today.  Unfortunately, upon my initial introduction to him, he is very hostile and has an agitated temperament.  He is frustrated that his INR has not become therapeutic and that he remains in the hospital.  No new medical concerns.  Marland Kitchen aspirin EC  81 mg Oral Daily  . cephALEXin  500 mg Oral QID  .  HYDROmorphone (DILAUDID) injection  1 mg Intravenous Once  . losartan  100 mg Oral Daily  . magnesium oxide  200 mg Oral TID  . multivitamin with minerals  1 tablet Oral Daily  . omega-3 acid ethyl esters  1 g Oral BID  . spironolactone  25 mg Oral Daily  . torsemide  40 mg Oral BID AC  . vitamin C  2,000 mg Oral Daily  . warfarin  15 mg Oral ONCE-1800  . Warfarin - Pharmacist Dosing Inpatient   Does not apply q1800   . heparin 2,250 Units/hr (07/07/17 0657)    OBJECTIVE: Physical Exam: Vitals:   07/06/17 0938 07/06/17 1354 07/06/17 1930 07/07/17 0354  BP: (!) 136/59 103/75 (!) 117/50 (!) 149/66  Pulse: 70 69 70 69  Resp: (!) 24 17 19 19   Temp:  (!) 97.5 F (36.4 C) 98.1 F (36.7 C) 97.7 F (36.5 C)  TempSrc:  Oral Oral Axillary  SpO2: 95% 95% 93% 96%  Weight:    (!) 149.8 kg (330 lb 4.8 oz)  Height:        Intake/Output Summary (Last 24 hours) at 07/07/2017 1052 Last data filed at 07/07/2017 0700 Gross per 24 hour  Intake 655.08 ml  Output 2000 ml  Net -1344.92 ml    Telemetry reveals V paced rhythm  GEN- The patient is overweight, well appearing, alert and oriented x 3 today.  Very labile temperament, extremely agitated and not easy to engage  Head- normocephalic, atraumatic Eyes-  Sclera clear, conjunctiva pink Ears- hearing intact Oropharynx- clear Neck- supple, L neck temp/perm in place Lungs- Clear to ausculation bilaterally, normal work of breathing Heart- Regular rate and rhythm (paced) GI- soft, NT, ND, + BS Extremities- no clubbing, cyanosis, + dependant edema Skin- modest L chest pacemaker site hematoma,   Sutures in place, no drainage Psych- Very labile temperament, extremely agitated and not easy to engage   LABS: Basic Metabolic Panel: Recent Labs    07/06/17 0423 07/07/17 0421  NA 136 136  K 4.1 4.0  CL 98* 95*  CO2 29 30  GLUCOSE 112* 100*  BUN 28* 24*  CREATININE 1.09 1.06  CALCIUM 9.1 9.4   Liver Function Tests: No results for input(s): AST, ALT, ALKPHOS, BILITOT, PROT, ALBUMIN in the last 72 hours. No results for input(s): LIPASE, AMYLASE in the last 72 hours. CBC: Recent Labs    07/06/17 0423 07/07/17 0421  WBC 8.0 8.9  HGB 10.2* 10.4*  HCT 31.6* 32.3*  MCV 102.6* 103.2*  PLT 276 300     Patient Profile     Frank Harbuck Morphewis a 72 y.o.maleadmitted for device system extraction 2/2 pocket infection. Now with temp perm pacemaker while awaiting therapeutic INR and device re-implant.  Assessment & Plan    1. Device pocket infection He has a modest L chest pocket hematoma.  I agree with Dr Lovena Le that it is prudent to keep him in the hospital while bridging the therapeutic INR.  I share Dr Tanna Furry concern that lovenox would carry increased bleeding risk and is not preferred over parental heparin given  his ongoing issues with a pacemaker pocket hematoma.  2. Complete heart block Temp/perm device in place and appears to be functioning normally I will defer decisions about new implant and whether or not he can be discharged prior to implant to Dr Lovena Le.  3. Mechanical MVR Awaiting therapeutic INR Appreciate pharmacy assistance with coumadin and parental heparin As above, I do not recommend lovenox at this time.  This gentleman is quite ill.  He carries a very high morbidity/ mortality at this time.  Unfortunately, his insight appears to be rather poor into his condition and he is extremely difficult to have a conversation with 72 morning.  I tried to reassure he and his wife that he was receiving the appropriate care and that our only intention was  that he have the best possible outcome given the unfortunately circumstances of his overall medical condition and recent device pocket infection.  Thompson Grayer, MD 07/07/2017 10:52 AM

## 2017-07-08 LAB — CBC
HCT: 32 % — ABNORMAL LOW (ref 39.0–52.0)
Hemoglobin: 10.2 g/dL — ABNORMAL LOW (ref 13.0–17.0)
MCH: 32.8 pg (ref 26.0–34.0)
MCHC: 31.9 g/dL (ref 30.0–36.0)
MCV: 102.9 fL — AB (ref 78.0–100.0)
PLATELETS: 314 10*3/uL (ref 150–400)
RBC: 3.11 MIL/uL — ABNORMAL LOW (ref 4.22–5.81)
RDW: 14.3 % (ref 11.5–15.5)
WBC: 8.4 10*3/uL (ref 4.0–10.5)

## 2017-07-08 LAB — PROTIME-INR
INR: 2.03
PROTHROMBIN TIME: 22.8 s — AB (ref 11.4–15.2)

## 2017-07-08 LAB — HEPARIN LEVEL (UNFRACTIONATED): Heparin Unfractionated: 0.55 IU/mL (ref 0.30–0.70)

## 2017-07-08 MED ORDER — WARFARIN SODIUM 10 MG PO TABS
10.0000 mg | ORAL_TABLET | Freq: Once | ORAL | Status: AC
  Administered 2017-07-08: 10 mg via ORAL
  Filled 2017-07-08: qty 1

## 2017-07-08 NOTE — Progress Notes (Signed)
[place] for heparin>>warfarin Indication: Mechanical MV, atrial fibrillation  Allergies  Allergen Reactions  . Warfarin And Related Other (See Comments)    Per the patient's wife, "Warfain made him have a TIA" CAN TAKE BRAND NAME COUMADIN   . Lovenox [Enoxaparin Sodium] Other (See Comments)    Causes large lumps at injection site    Patient Measurements: Height: 5\' 11"  (180.3 cm) Weight: (!) 329 lb 14.4 oz (149.6 kg) IBW/kg (Calculated) : 75.3 Heparin Dosing Weight: 150 kg  Vital Signs: Temp: 98 F (36.7 C) (03/31 0343) Temp Source: Oral (03/31 0343) BP: 129/48 (03/31 0343) Pulse Rate: 70 (03/31 0343)  Labs: Recent Labs    07/06/17 0423 07/07/17 0421 07/08/17 0826  HGB 10.2* 10.4* 10.2*  HCT 31.6* 32.3* 32.0*  PLT 276 300 314  LABPROT 19.1* 20.1* 22.8*  INR 1.62 1.73 2.03  HEPARINUNFRC 0.54 0.35 0.55  CREATININE 1.09 1.06  --     Estimated Creatinine Clearance: 97.7 mL/min (by C-G formula based on SCr of 1.06 mg/dL).   Assessment: 72 year old male readmitted for pacemaker exchange after developing pocket infection. BiV pacer removed 3/22, temp pacer in place.   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 After discussion with patient, Dr. Rayann Heman, and Dr. Lovena Le, have decided to discontinue IV heparin.  PTA warfarin dosing: 10 mg daily, except 5 mg on Wednesdays  INR subtherapeutic at 2.03  CBC stable   Goal of Therapy:  Monitor platelets by anticoagulation protocol: Yes  Goal INR = 2.5 to 3.5   Plan:  Warfarin 10 mg po x 1 tonight Daily INR Monitor for s/sx of bleeding  Angus Seller, PharmD Pharmacy Resident Clinical Phone for 07/08/2017 until 3:30pm: [phone] If after 3:30pm, please call main pharmacy at [phone] 07/08/2017 10:59 AM

## 2017-07-08 NOTE — Progress Notes (Signed)
Progress Note   Subjective   Doing well today, the patient denies CP or SOB.  Remains very agitated.  Has had some oozing from his dressing overnight.   No new concerns  Inpatient Medications    Scheduled Meds: . aspirin EC  81 mg Oral Daily  . cephALEXin  500 mg Oral QID  .  HYDROmorphone (DILAUDID) injection  1 mg Intravenous Once  . losartan  100 mg Oral Daily  . magnesium oxide  200 mg Oral TID  . multivitamin with minerals  1 tablet Oral Daily  . omega-3 acid ethyl esters  1 g Oral BID  . spironolactone  25 mg Oral Daily  . torsemide  40 mg Oral BID AC  . vitamin C  2,000 mg Oral Daily  . warfarin  10 mg Oral ONCE-1800  . Warfarin - Pharmacist Dosing Inpatient   Does not apply q1800   Continuous Infusions: . heparin 2,250 Units/hr (07/08/17 1022)   PRN Meds: acetaminophen, acetaminophen **AND** diphenhydrAMINE, morphine injection, ondansetron (ZOFRAN) IV, traMADol   Vital Signs    Vitals:   07/07/17 0745 07/07/17 1500 07/07/17 1936 07/08/17 0343  BP:  (!) 120/34 (!) 114/56 (!) 129/48  Pulse: 70 70 70 70  Resp: 18 18 17 13   Temp:  98.3 F (36.8 C) 98.7 F (37.1 C) 98 F (36.7 C)  TempSrc:  Oral Oral Oral  SpO2: 93% 93% 92% 94%  Weight:    (!) 149.6 kg (329 lb 14.4 oz)  Height:        Intake/Output Summary (Last 24 hours) at 07/08/2017 1052 Last data filed at 07/08/2017 0400 Gross per 24 hour  Intake 1080 ml  Output 1200 ml  Net -120 ml   Filed Weights   07/06/17 0457 07/07/17 0354 07/08/17 0343  Weight: (!) 151.3 kg (333 lb 9.6 oz) (!) 149.8 kg (330 lb 4.8 oz) (!) 149.6 kg (329 lb 14.4 oz)    Telemetry    V paced - Personally Reviewed  Physical Exam   GEN- The patient is overweight and chronically appearing, alert and oriented x 3 today.   Head- normocephalic, atraumatic Eyes-  Sclera clear, conjunctiva pink Ears- hearing intact Oropharynx- clear Neck- supple, Lungs- Clear to ausculation bilaterally, normal work of breathing Heart- Regular  rate and rhythm  (paced), mechanical S1 GI- soft, NT, ND, + BS Extremities- no clubbing, cyanosis, +2 edema  MS- no significant deformity or atrophy Skin- L chest with pacemaker site hematoma,  No active bleeding, incision is closed,  Dressing in place Psych- very hostile and agitated again today,  Frequent outbursts and inappropriate statements   Labs    Chemistry Recent Labs  Lab 07/05/17 0455 07/06/17 0423 07/07/17 0421  NA 135 136 136  K 4.2 4.1 4.0  CL 96* 98* 95*  CO2 29 29 30   GLUCOSE 103* 112* 100*  BUN 29* 28* 24*  CREATININE 1.09 1.09 1.06  CALCIUM 8.6* 9.1 9.4  GFRNONAA >60 >60 >60  GFRAA >60 >60 >60  ANIONGAP 10 9 11      Hematology Recent Labs  Lab 07/06/17 0423 07/07/17 0421 07/08/17 0826  WBC 8.0 8.9 8.4  RBC 3.08* 3.13* 3.11*  HGB 10.2* 10.4* 10.2*  HCT 31.6* 32.3* 32.0*  MCV 102.6* 103.2* 102.9*  MCH 33.1 33.2 32.8  MCHC 32.3 32.2 31.9  RDW 14.2 14.2 14.3  PLT 276 300 314    Cardiac EnzymesNo results for input(s): TROPONINI in the last 168 hours. No results for input(s): TROPIPOC  in the last 168 hours.      Assessment & Plan    1.  Device pocket infection Modest L chest pocket hematoma is stable. Remains in hospital for close observation and bridging of anticoagulation. Patient is deconditioned and has not been out of bed much.  2. Complete heart block Temp/perm in place and functioning Decisions about reimplantation and whether or not to discharge prior to implant per Dr Lovena Le  3. Mechanical MVR Goal INR is 2.5-3.5 in his anticoagulation visit notes and as per guidelines. I have advised that he therefore remain on IV heparin until his INR is 2.5.  I do not see any documentation otherwise in epic.  Anticoagulation pharmacist and I have also spoken this am and agree with INR of 2.5 as goal for stopping anticoagualtion. When I mentioned this plan to him, he became very agitated.  He said "You are pissing me off.  Leave my room right now and  dont come back.  I dont ever want to see your face or hear your voice again." I explained that my only intention was to provide him with appropriate care.  I apologized for any way that I may have upset him otherwise. I have per his wishes stopped heparin IV drip.  I have spoken by text with Dr Lovena Le who is in agreement with this.  I have however made it very clear to the patient that my medical advice for him is to continue IV heparin until INR is 2.5 as per guidelines.  He is clear that he wishes to stop heparin at this time.  4. Deconditioning Up as tolerating today.  Hopefully may be ready for discharge in the next day or so.  Dr Lovena Le to follow-up with patient tomorrow.  Thompson Grayer MD, [facility] 07/08/2017 10:52 AM

## 2017-07-08 NOTE — Progress Notes (Addendum)
[facility] for heparin>>warfarin Indication: Mechanical MV, atrial fibrillation  Allergies  Allergen Reactions  . Warfarin And Related Other (See Comments)    Per the patient's wife, "Warfain made him have a TIA" CAN TAKE BRAND NAME COUMADIN   . Lovenox [Enoxaparin Sodium] Other (See Comments)    Causes large lumps at injection site    Patient Measurements: Height: 5\' 11"  (180.3 cm) Weight: (!) 329 lb 14.4 oz (149.6 kg) IBW/kg (Calculated) : 75.3 Heparin Dosing Weight: 150 kg  Vital Signs: Temp: 98 F (36.7 C) (03/31 0343) Temp Source: Oral (03/31 0343) BP: 129/48 (03/31 0343) Pulse Rate: 70 (03/31 0343)  Labs: Recent Labs    07/06/17 0423 07/07/17 0421 07/08/17 0826  HGB 10.2* 10.4* 10.2*  HCT 31.6* 32.3* 32.0*  PLT 276 300 314  LABPROT 19.1* 20.1* 22.8*  INR 1.62 1.73 2.03  HEPARINUNFRC 0.54 0.35 0.55  CREATININE 1.09 1.06  --     Estimated Creatinine Clearance: 97.7 mL/min (by C-G formula based on SCr of 1.06 mg/dL).   Assessment: 72 year old male readmitted for pacemaker exchange after developing pocket infection. BiV pacer removed 3/22, temp pacer in place.   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 IV heparin per pharmacy given mechanical valve, continue warfarin. Given recent surgery and expectation of another surgery, EP asked that heparin dosing be conservative and that we avoid boluses.  PTA warfarin dosing: 10 mg daily, except 5 mg on Wednesdays  Heparin level therapeutic at 0.55 (heparin stopped and lab drawn from arm infusing heparin) INR subtherapeutic at 2.03  CBC stable   Goal of Therapy:  Heparin level ~0.3 units/ml Monitor platelets by anticoagulation protocol: Yes  Goal INR = 2.5 to 3.5   Plan:  Continue heparin at 2250 units / hr Warfarin 10 mg po x 1 tonight Daily heparin level, CBC, INR Monitor for s/sx of bleeding  Angus Seller, PharmD Pharmacy Resident Clinical Phone for 07/08/2017 until 3:30pm: [phone] If after 3:30pm, please call main pharmacy at [phone] 07/08/2017 9:37 AM

## 2017-07-09 ENCOUNTER — Other Ambulatory Visit: Payer: Self-pay | Admitting: Physician Assistant

## 2017-07-09 ENCOUNTER — Ambulatory Visit: Payer: Medicare Other

## 2017-07-09 DIAGNOSIS — Z79899 Other long term (current) drug therapy: Secondary | ICD-10-CM

## 2017-07-09 LAB — PROTIME-INR
INR: 2.01
PROTHROMBIN TIME: 22.6 s — AB (ref 11.4–15.2)

## 2017-07-09 NOTE — Progress Notes (Addendum)
Progress Note  Patient Name: Frank [name] Date of Encounter: 07/09/2017  Primary Cardiologist: Dr. [name]  Subjective   No complaints this morning  Inpatient Medications    Scheduled Meds: . aspirin EC  81 mg Oral Daily  . cephALEXin  500 mg Oral QID  .  HYDROmorphone (DILAUDID) injection  1 mg Intravenous Once  . losartan  100 mg Oral Daily  . magnesium oxide  200 mg Oral TID  . multivitamin with minerals  1 tablet Oral Daily  . omega-3 acid ethyl esters  1 g Oral BID  . spironolactone  25 mg Oral Daily  . torsemide  40 mg Oral BID AC  . vitamin C  2,000 mg Oral Daily  . Warfarin - Pharmacist Dosing Inpatient   Does not apply q1800    PRN Meds: acetaminophen, acetaminophen **AND** diphenhydrAMINE, morphine injection, ondansetron (ZOFRAN) IV, traMADol   Vital Signs    Vitals:   07/08/17 0343 07/08/17 1333 07/08/17 1927 07/09/17 0434  BP: (!) 129/48 (!) 117/44 (!) 114/42 (!) 104/55  Pulse: 70 72 72 70  Resp: 13 18 (!) 21 (!) 23  Temp: 98 F (36.7 C) (!) 97.4 F (36.3 C) 98.9 F (37.2 C) 97.8 F (36.6 C)  TempSrc: Oral Oral Oral Oral  SpO2: 94% 98% 92% 91%  Weight: (!) 329 lb 14.4 oz (149.6 kg)   (!) 326 lb 12.8 oz (148.2 kg)  Height:        Intake/Output Summary (Last 24 hours) at 07/09/2017 1018 Last data filed at 07/09/2017 0946 Gross per 24 hour  Intake 1552 ml  Output -  Net 1552 ml   Filed Weights   07/07/17 0354 07/08/17 0343 07/09/17 0434  Weight: (!) 330 lb 4.8 oz (149.8 kg) (!) 329 lb 14.4 oz (149.6 kg) (!) 326 lb 12.8 oz (148.2 kg)    Telemetry    Ventricular paced rhythm.  Personally reviewed.  ECG    No new EKGs.  Personally reviewed.  Physical Exam   GEN:  Obese male.  No acute distress.   Neck: No JVD. L IJ temp perm device Thorax: Dressed left upper chest site with temporary pacer device noted. Cardiac: RRR, valve is appreciate, 1-2/6SM, no gallop.  Respiratory: Non labored. CTA b/l. GI: Soft, nontender, bowel sounds  present.  MS: braqwny type edema (at about his chronic state per the patient), no significant pitting edema; No deformity. Neuro:  Nonfocal. Psych: Alert and oriented x 3. Normal affect.  Small amount of blood at very lateral edge of extraction site.  Labs    Chemistry Recent Labs  Lab 07/05/17 0455 07/06/17 0423 07/07/17 0421  NA 135 136 136  K 4.2 4.1 4.0  CL 96* 98* 95*  CO2 29 29 30   GLUCOSE 103* 112* 100*  BUN 29* 28* 24*  CREATININE 1.09 1.09 1.06  CALCIUM 8.6* 9.1 9.4  GFRNONAA >60 >60 >60  GFRAA >60 >60 >60  ANIONGAP 10 9 11      Hematology Recent Labs  Lab 07/06/17 0423 07/07/17 0421 07/08/17 0826  WBC 8.0 8.9 8.4  RBC 3.08* 3.13* 3.11*  HGB 10.2* 10.4* 10.2*  HCT 31.6* 32.3* 32.0*  MCV 102.6* 103.2* 102.9*  MCH 33.1 33.2 32.8  MCHC 32.3 32.2 31.9  RDW 14.2 14.2 14.3  PLT 276 300 314    Radiology    No results found.  Cardiac Studies   Echocardiogram 01/18/2017: Study Conclusions  - Left ventricle: The cavity size was normal. Septal wall  thickness   was increased in a pattern of severe LVH with otherwise mild   concentric hypertrophy. Systolic function was mildly reduced. The   estimated ejection fraction was in the range of 45% to 50%.   Hypokinesis of the anteroseptal, inferoseptal, and apical   myocardium. The study is not technically sufficient to allow   evaluation of LV diastolic function. - Aortic valve: There was very mild stenosis. There was mild   regurgitation. Valve area (VTI): 3.42 cm^2. Valve area (Vmax):   3.58 cm^2. Valve area (Vmean): 3.49 cm^2. - Mitral valve: A mechanical prosthesis was present. Transvalvular   velocity was within the normal range. There was no evidence for   stenosis. There was no regurgitation. Valve area by continuity   equation (using LVOT flow): 2.49 cm^2. - Left atrium: The atrium was moderately dilated. - Right ventricle: The cavity size was severely dilated. Wall   thickness was normal. Systolic  function was moderately reduced. - Tricuspid valve: There was mild regurgitation. - Pulmonary arteries: Systolic pressure was within the normal   range. PA peak pressure: 22 mm Hg (S). Impressions: - Mechanical mitral valve is functioning properly. Gradients   unchanged from 07/2015.  Patient Profile    72 y.o. male with a history of hypertrophic cardiomyopathy status post septal myomectomy and mechanical MVR with aortic valve repair.  He recently underwent explantation of a [employer] CRT-P due to device infection.  Currently with temporary/permanent device in place and plan for permanent device implantation later this week.  Assessment & Plan    1.  Pacemaker site infection status post explantation of [employer] CRT-P w/placement of temp-perm device L IJ on 06/29/17       Hx of CHB      Getting PO Keflex 500mg  QID, today is day #8      afebrile      Developed pocket (explant site) hematoma      H/H stable  2.  Hypertrophic cardiomyopathy status post septal myomectomy.         Last echocardiogram from October 2018 is outlined above.      BP well controlled  3.  Paroxysmal atrial fibrillation      CHA2DS2Vasc is 2        He is on Coumadin, remains subtherapeutic given mechanical valve  4. Mechanical MVR     Pharmacy for heparin gtt bridge to therapeutic INR, though close to goal at 2.01 this AM      Dr. Lovena Le to see, suspect he will go home this afternoon after redress of his sites.   Signed, Baldwin Jamaica, PA-C  07/09/2017, 10:18 AM    EP attending  Patient seen and examined.  Agree with the findings as noted above.  His INR remains at 2 today.  He has persistent  pacemaker pocket hematoma.  The patient is insistent on discharge home.  We have rebanding to his wound.  His wound does not look infected at this time.  The patient will be discharged home with his indwelling temporary pacing system.  He will come back in 2 days for an INR and labs.  We are limited to some extent and  how aggressively anticoagulate the patient because of his bleeding risk with a relatively fresh pocket.  I would anticipate removal of his sutures at the time of his presentation in 2 days.  I would anticipate insertion of a new pacing system in approximately 2-3 weeks.  Crissie Sickles, MD

## 2017-07-09 NOTE — Discharge Instructions (Signed)

## 2017-07-09 NOTE — Progress Notes (Signed)
Discharge order obtained.  IV removed intact, telemetry monitor removed.  Reviewed AVS with patient/family, including medications, activity/restrictions, follow-up appointments.  Patient and family verbalized understanding.  Questions asked and answered.  Belongings given to family/patient.  Copy of AVS signature form placed in chart.   

## 2017-07-09 NOTE — Discharge Summary (Addendum)
DISCHARGE SUMMARY    Patient ID: Frank [name],  MRN: [id number], DOB/AGE: 06-07-1945 72 y.o.  Admit date: 06/29/2017 Discharge date: 07/09/2017  Primary Care Physician: [name], MD  Primary Cardiologist/Electrophysiologist: Dr. [name]  Primary Discharge Diagnosis:  1. PPM pocket infection 2. CHB  Secondary Discharge Diagnosis:  1. Paroxysmal AFib     CHA2DS2Vasc is 2 2. Mechanical MVR     On warfarin  Allergies  Allergen Reactions  . Warfarin And Related Other (See Comments)    Per the patient's wife, "Warfain made him have a TIA" CAN TAKE BRAND NAME COUMADIN   . Lovenox [Enoxaparin Sodium] Other (See Comments)    Causes large lumps at injection site     Procedures This Admission:  1. 06/29/17:      CRT-PPM system extraction with placement of Temp-perm device       Brief HPI: Frank [name] is a 72 y.o. male is followed out patient by Dr. [name] underwent upgrade to CRT device  February, his pocket became infected.  Risks, benefits, and alternatives to CRT-P extraction with temp-perm implant given hx of CHB were reviewed with the patient who wished to proceed.   Hospital Course:  The patient was admitted and underwent device system extraction, please see procedure report for full details.  Given mechanical MVR, he was started post procedure on heparin gtt and his coumadin resumed.  He devloped pocket hematoma.  This was addressed initially with pressure dressing with improvement though not complete resolution, and has small ammount of drainage on day of discharge from very lateral edge.   He was monitored on telemetry throughout his stay, remained 100% V paced.   Wound care, dressing changes were discussed with the patient and wife.  The [atient INR day of discharge is 2.01, dr. Lovena Le felt OK to discharge and he will resume his home coumadin dosing schedule and we have scheduled coumadin clinic visit, and wound check visit for Wed, 07/11/17.  His  Temp-perm dressing was changed the day of discharge with Dr. Lovena Le, applying new tegaderm.  His extraction site sutures remain in place, Dr. Lovena Le removed the lateral last stitch to try and aid reduction of hematoma .  The patient and wife were instructed not to remove or manage temp-perm dressing, and change the gauze/loose dressing as needed if becomes saturated, they state understanding.  His wife reporting that she worked years in a hospital and feels like she will be able to manage this without help.  No further antibiotics given he has completed 8 days here including his post-proedure IV antibiotics.  He has been afebrile, BP has been stable.  He reports ambulating in his room without difficulty and would like to go home.  The patient was examined by Dr. Flossie Dibble considered him stable for discharge to home. He will resume all of his home medicines unchanged, will plan BMET and mag level at his visit 07/11/17 as well, given his meds.   Physical Exam: Vitals:   07/08/17 0343 07/08/17 1333 07/08/17 1927 07/09/17 0434  BP: (!) 129/48 (!) 117/44 (!) 114/42 (!) 104/55  Pulse: 70 72 72 70  Resp: 13 18 (!) 21 (!) 23  Temp: 98 F (36.7 C) (!) 97.4 F (36.3 C) 98.9 F (37.2 C) 97.8 F (36.6 C)  TempSrc: Oral Oral Oral Oral  SpO2: 94% 98% 92% 91%  Weight: (!) 329 lb 14.4 oz (149.6 kg)   (!) 326 lb 12.8 oz (148.2 kg)  Height:  Labs:   Lab Results  Component Value Date   WBC 8.4 07/08/2017   HGB 10.2 (L) 07/08/2017   HCT 32.0 (L) 07/08/2017   MCV 102.9 (H) 07/08/2017   PLT 314 07/08/2017    Recent Labs  Lab 07/07/17 0421  NA 136  K 4.0  CL 95*  CO2 30  BUN 24*  CREATININE 1.06  CALCIUM 9.4  GLUCOSE 100*    Discharge Medications:  Allergies as of 07/09/2017      Reactions   Warfarin And Related Other (See Comments)   Per the patient's wife, "Warfain made him have a TIA" CAN TAKE BRAND NAME COUMADIN    Lovenox [enoxaparin Sodium] Other (See Comments)   Causes large  lumps at injection site      Medication List    TAKE these medications   aspirin 81 MG tablet Take 81 mg by mouth daily.   COUMADIN 10 MG tablet Generic drug:  warfarin Take as directed. If you are unsure how to take this medication, talk to your nurse or doctor. Original instructions:  TAKE AS DIRECTED BY COUMADIN CLINIC What changed:  See the new instructions. Notes to patient:  Continue your usual home regime as you were, you have a coumadin clinic visit scheduled for 07/11/17   diphenhydramine-acetaminophen 25-500 MG Tabs tablet Commonly known as:  TYLENOL PM Take 2 tablets by mouth at bedtime as needed (for sleep).   fish oil-omega-3 fatty acids 1000 MG capsule Take 4 g by mouth every morning.   losartan 100 MG tablet Commonly known as:  COZAAR Take 1 tablet (100 mg total) by mouth daily.   Magnesium Oxide 250 MG Tabs Take 1 tablet (250 mg total) by mouth 3 (three) times daily.   multivitamin tablet Take 1 tablet by mouth daily.   potassium chloride 10 MEQ tablet Commonly known as:  K-DUR Take 11 tablets (110 mEq total) by mouth daily. TAKE 11 TABLETS BY MOUTH ONCE DAILY What changed:    how much to take  when to take this  additional instructions Notes to patient:  Continue to take as you were as discussed with Dr. Lovena Le   spironolactone 25 MG tablet Commonly known as:  ALDACTONE Take 1 tablet (25 mg total) by mouth daily.   torsemide 20 MG tablet Commonly known as:  DEMADEX Take 2 tablets (40 mg total) by mouth 2 (two) times daily.   vitamin C 1000 MG tablet Take 2,000 mg by mouth daily.       Disposition: Home Discharge Instructions    Diet - low sodium heart healthy   Complete by:  As directed    Increase activity slowly   Complete by:  As directed      Follow-up Information    [place] [facility] Follow up on 07/11/2017.   Specialty:  Cardiology Why:  11:00AM, wound check visit coumadin clinic and labs to follow Contact  information: [address] [facility]       [name], MD Follow up on 07/24/2017.   Specialty:  Cardiology Why:  at 2:45PM Contact information: [address] [address] [phone]           Duration of Discharge Encounter: Greater than 30 minutes including physician time.  Venetia Night, PA-C 07/09/2017 1:43 PM EP attending   Patient seen and examined.  Agree with the findings as noted above.  Also see the clinic note from  today.  The patient is stable for discharge and will follow up in 2 days with an INR and additional labs.  Cristopher Peru, MD

## 2017-07-10 ENCOUNTER — Ambulatory Visit: Payer: Medicare Other | Admitting: Adult Health

## 2017-07-11 ENCOUNTER — Ambulatory Visit (INDEPENDENT_AMBULATORY_CARE_PROVIDER_SITE_OTHER): Payer: Medicare Other | Admitting: *Deleted

## 2017-07-11 ENCOUNTER — Ambulatory Visit (INDEPENDENT_AMBULATORY_CARE_PROVIDER_SITE_OTHER): Payer: Self-pay | Admitting: *Deleted

## 2017-07-11 ENCOUNTER — Other Ambulatory Visit: Payer: Medicare Other

## 2017-07-11 ENCOUNTER — Other Ambulatory Visit: Payer: Medicare Other | Admitting: *Deleted

## 2017-07-11 DIAGNOSIS — Z79899 Other long term (current) drug therapy: Secondary | ICD-10-CM | POA: Diagnosis not present

## 2017-07-11 DIAGNOSIS — Z5181 Encounter for therapeutic drug level monitoring: Secondary | ICD-10-CM

## 2017-07-11 DIAGNOSIS — I4891 Unspecified atrial fibrillation: Secondary | ICD-10-CM

## 2017-07-11 DIAGNOSIS — G459 Transient cerebral ischemic attack, unspecified: Secondary | ICD-10-CM | POA: Diagnosis not present

## 2017-07-11 DIAGNOSIS — I441 Atrioventricular block, second degree: Secondary | ICD-10-CM

## 2017-07-11 DIAGNOSIS — Z9889 Other specified postprocedural states: Secondary | ICD-10-CM

## 2017-07-11 LAB — POCT INR: INR: 2.4

## 2017-07-11 NOTE — Progress Notes (Signed)
   e.     .   Wound check status post explantation of device, Site noted to have a hematoma and oozing site assessed by Chanetta Marshall who contacted Dr. Lovena Le and he recommended removing sutures and covering with gauze, pt to f/u 07/17/2017 for wound recheck

## 2017-07-11 NOTE — Patient Instructions (Signed)
Description   Today take another 1/2 tablet (today a total of 20mg ) then continue taking 10mg  daily except 15mg  on Sundays and Wednesdays.  Recheck INR in 1 week. Call with any concerns any new medications or if scheduled for any procedures  [phone]

## 2017-07-12 LAB — BASIC METABOLIC PANEL
BUN / CREAT RATIO: 19 (ref 10–24)
BUN: 24 mg/dL (ref 8–27)
CALCIUM: 9.9 mg/dL (ref 8.6–10.2)
CO2: 26 mmol/L (ref 20–29)
Chloride: 95 mmol/L — ABNORMAL LOW (ref 96–106)
Creatinine, Ser: 1.27 mg/dL (ref 0.76–1.27)
GFR calc non Af Amer: 57 mL/min/{1.73_m2} — ABNORMAL LOW (ref >59)
GFR, EST AFRICAN AMERICAN: 66 mL/min/{1.73_m2} (ref >59)
Glucose: 86 mg/dL (ref 65–99)
Potassium: 5.3 mmol/L — ABNORMAL HIGH (ref 3.5–5.2)
Sodium: 137 mmol/L (ref 134–144)

## 2017-07-12 LAB — MAGNESIUM: Magnesium: 2.4 mg/dL — ABNORMAL HIGH (ref 1.6–2.3)

## 2017-07-13 ENCOUNTER — Telehealth: Payer: Self-pay | Admitting: *Deleted

## 2017-07-13 DIAGNOSIS — E875 Hyperkalemia: Secondary | ICD-10-CM

## 2017-07-13 NOTE — Addendum Note (Signed)
Addended by: Claude Manges on: 07/13/2017 10:30 AM   Modules accepted: Orders

## 2017-07-13 NOTE — Telephone Encounter (Signed)
SPOKE WITH PT ABOUT RESULTS WHO ALSO SAW ON MY CHART AND VERBALIZED UNDERSTANDING  PT STATED HE HAS TAKEN HIS MORNING DOSE  79 MEQ AND WILL NOT TAKE THE EVENING DOSE. 40 MEQ.  PT WAS INSTRUCTED STARTING TOMORROW 07-14-17 TO TAKE ON LY 20 MEQ DAILY AND TO RETURN FOR LAB ON 07-20-17 FOR K+ LEVEL RECHCEK.  PT WAS  AGREEABLE WITH RECOMMENDATIONS AND WILL CONTACT OFFICE BACK IF ANY QUESTIONS OR CONCERNS.  PT DID STATE THAT IF KEITH ISNT IN THE LAB Friday HE WILL LEAVE AND COME  BACK Monday.  I CHECKED STAFF SCHEDULE AND KEITH IS WORKING 07-20-17 AND PT WAS TOLD HE SHOULD BE ABLE TO GET HIS BLOOD  DRAWN THAT DAY.

## 2017-07-16 ENCOUNTER — Encounter: Payer: Self-pay | Admitting: Internal Medicine

## 2017-07-16 ENCOUNTER — Other Ambulatory Visit: Payer: Self-pay | Admitting: Internal Medicine

## 2017-07-17 ENCOUNTER — Ambulatory Visit (INDEPENDENT_AMBULATORY_CARE_PROVIDER_SITE_OTHER): Payer: Medicare Other | Admitting: *Deleted

## 2017-07-17 ENCOUNTER — Ambulatory Visit (INDEPENDENT_AMBULATORY_CARE_PROVIDER_SITE_OTHER): Payer: Medicare Other | Admitting: Pharmacist

## 2017-07-17 DIAGNOSIS — Z5181 Encounter for therapeutic drug level monitoring: Secondary | ICD-10-CM

## 2017-07-17 DIAGNOSIS — I441 Atrioventricular block, second degree: Secondary | ICD-10-CM

## 2017-07-17 DIAGNOSIS — Z9889 Other specified postprocedural states: Secondary | ICD-10-CM

## 2017-07-17 DIAGNOSIS — T827XXD Infection and inflammatory reaction due to other cardiac and vascular devices, implants and grafts, subsequent encounter: Secondary | ICD-10-CM

## 2017-07-17 DIAGNOSIS — G459 Transient cerebral ischemic attack, unspecified: Secondary | ICD-10-CM

## 2017-07-17 DIAGNOSIS — I4891 Unspecified atrial fibrillation: Secondary | ICD-10-CM

## 2017-07-17 DIAGNOSIS — Z950 Presence of cardiac pacemaker: Secondary | ICD-10-CM

## 2017-07-17 LAB — CUP PACEART INCLINIC DEVICE CHECK
Battery Voltage: 2.99 V
Brady Statistic RA Percent Paced: 0 %
Brady Statistic RV Percent Paced: 99.25 %
Date Time Interrogation Session: 20190409173551
Implantable Lead Location: 753860
Lead Channel Impedance Value: 3187.5 Ohm
Lead Channel Pacing Threshold Amplitude: 0.75 V
Lead Channel Setting Pacing Pulse Width: 0.5 ms
Lead Channel Setting Sensing Sensitivity: 5 mV
MDC IDC LEAD IMPLANT DT: 20190322
MDC IDC MSMT BATTERY REMAINING LONGEVITY: 64 mo
MDC IDC MSMT LEADCHNL RV IMPEDANCE VALUE: 550 Ohm
MDC IDC MSMT LEADCHNL RV PACING THRESHOLD PULSEWIDTH: 0.5 ms
MDC IDC PG IMPLANT DT: 20190218
MDC IDC SET LEADCHNL RV PACING AMPLITUDE: 5 V
Pulse Gen Serial Number: 8996301

## 2017-07-17 LAB — POCT INR: INR: 3.7

## 2017-07-17 NOTE — Patient Instructions (Signed)
Replace wound dressing twice daily and as needed.  If your neck/pacemaker dressing is saturated, call the [facility].  If you have fever/chills, call our office or seek care at the [facility] ED.

## 2017-07-17 NOTE — Progress Notes (Signed)
Temp-perm pacemaker check in clinic. Normal device function. Thresholds, sensing, impedances consistent with previous measurements. No high ventricular rates noted. Device programmed at appropriate safety margins. Patient education completed. ROV with SK on 07/24/17.  Wound recheck in clinic. Patient denies fevers/chills. He reports he is now changing explant site dressing BID for sanguineous drainage (previously changing 5-6x daily). Dr. Lovena Le assessed wound and left IJ temp-perm insertion site. Temp-perm dressing changed, gauze and Tegaderm reapplied.    Dr. Lovena Le able to express deep red sanguineous fluid and blood clots from explant site, no evidence of purulence. Site dressed with gauze and paper tape per Dr. Lovena Le. Patient will plan to replace with gauze and Medipore tape when he returns home per his preference. Plan to continue changing explant site dressing BID and PRN for saturated dressing. Wound recheck at appointment with Dr. [name] on 07/24/17. Patient to call our office or seek emergency medical care in the interim for signs/symptoms of infection. Patient will call our office if temp-perm dressing becomes saturated. Patient and wife verbalize understanding of all instructions and deny questions or concerns at this time.

## 2017-07-17 NOTE — Patient Instructions (Signed)
Description   Tomorrow take 10mg  then continue taking 10mg  daily except 15mg  on Sundays and Wednesdays.  Recheck INR in 1 week. Call with any concerns any new medications or if scheduled for any procedures  [phone]

## 2017-07-20 ENCOUNTER — Other Ambulatory Visit: Payer: Medicare Other

## 2017-07-21 ENCOUNTER — Telehealth: Payer: Self-pay | Admitting: Physician Assistant

## 2017-07-21 ENCOUNTER — Other Ambulatory Visit: Payer: Self-pay | Admitting: Physician Assistant

## 2017-07-21 DIAGNOSIS — Z79899 Other long term (current) drug therapy: Secondary | ICD-10-CM

## 2017-07-21 DIAGNOSIS — E875 Hyperkalemia: Secondary | ICD-10-CM

## 2017-07-21 NOTE — Telephone Encounter (Signed)
In review of chart to look for expected lab to be done yesterday to f/u on his BMET, noted not done.  Called patient, he says he just completely forgot about the lab draw.    He is feeling well, outside of an aching discomfort after his visit 4/9 visit (noting the explant site was expressed of fluid/clots), he reports minimal drainage, they are changing the dressing BID though dressing is never saturated and about the same or less then prior to his visit.  Temp-perm site is dry and stable per his report.  He is taking total of 26meq day of his Potassium, rather then just a reduction of 19meq,  Noting CMA instructions.  I recommended getting lab draw today to see where his K+ is and importance of this.  He understands but does not want to go to the ER to have his lab drawn.  He will take resume his usual/prior dose 9meq AM, 66meq PM and get BMET done Monday AM  Tommye Standard, PA-C

## 2017-07-23 ENCOUNTER — Other Ambulatory Visit: Payer: Medicare Other | Admitting: *Deleted

## 2017-07-23 ENCOUNTER — Other Ambulatory Visit: Payer: Medicare Other

## 2017-07-23 DIAGNOSIS — E875 Hyperkalemia: Secondary | ICD-10-CM

## 2017-07-23 LAB — BASIC METABOLIC PANEL
BUN/Creatinine Ratio: 22 (ref 10–24)
BUN: 26 mg/dL (ref 8–27)
CALCIUM: 9.8 mg/dL (ref 8.6–10.2)
CO2: 23 mmol/L (ref 20–29)
Chloride: 97 mmol/L (ref 96–106)
Creatinine, Ser: 1.16 mg/dL (ref 0.76–1.27)
GFR, EST AFRICAN AMERICAN: 74 mL/min/{1.73_m2} (ref >59)
GFR, EST NON AFRICAN AMERICAN: 64 mL/min/{1.73_m2} (ref >59)
Glucose: 95 mg/dL (ref 65–99)
Potassium: 4.4 mmol/L (ref 3.5–5.2)
Sodium: 136 mmol/L (ref 134–144)

## 2017-07-24 ENCOUNTER — Other Ambulatory Visit: Payer: Self-pay | Admitting: Physician Assistant

## 2017-07-24 ENCOUNTER — Ambulatory Visit (INDEPENDENT_AMBULATORY_CARE_PROVIDER_SITE_OTHER): Payer: Medicare Other | Admitting: Internal Medicine

## 2017-07-24 ENCOUNTER — Ambulatory Visit (INDEPENDENT_AMBULATORY_CARE_PROVIDER_SITE_OTHER): Payer: Medicare Other | Admitting: *Deleted

## 2017-07-24 ENCOUNTER — Telehealth: Payer: Self-pay | Admitting: Physician Assistant

## 2017-07-24 ENCOUNTER — Encounter: Payer: Self-pay | Admitting: Internal Medicine

## 2017-07-24 VITALS — BP 130/56 | HR 72 | Ht 71.0 in | Wt 324.0 lb

## 2017-07-24 DIAGNOSIS — G459 Transient cerebral ischemic attack, unspecified: Secondary | ICD-10-CM | POA: Diagnosis not present

## 2017-07-24 DIAGNOSIS — Z79899 Other long term (current) drug therapy: Secondary | ICD-10-CM

## 2017-07-24 DIAGNOSIS — Z9889 Other specified postprocedural states: Secondary | ICD-10-CM | POA: Diagnosis not present

## 2017-07-24 DIAGNOSIS — E875 Hyperkalemia: Secondary | ICD-10-CM

## 2017-07-24 DIAGNOSIS — Z5181 Encounter for therapeutic drug level monitoring: Secondary | ICD-10-CM

## 2017-07-24 DIAGNOSIS — I441 Atrioventricular block, second degree: Secondary | ICD-10-CM

## 2017-07-24 DIAGNOSIS — Z950 Presence of cardiac pacemaker: Secondary | ICD-10-CM

## 2017-07-24 DIAGNOSIS — T827XXD Infection and inflammatory reaction due to other cardiac and vascular devices, implants and grafts, subsequent encounter: Secondary | ICD-10-CM

## 2017-07-24 DIAGNOSIS — I4891 Unspecified atrial fibrillation: Secondary | ICD-10-CM | POA: Diagnosis not present

## 2017-07-24 LAB — POCT INR: INR: 3.3

## 2017-07-24 NOTE — Patient Instructions (Signed)
Medication Instructions:  Your physician recommends that you continue on your current medications as directed. Please refer to the Current Medication list given to you today.  Labwork: Your physician recommends that you return for lab work in:   Tuesday April 23 for a repeat PT/INR, BMP, and CBC   Testing/Procedures: You are scheduled for a Biventricular ICD implant with Dr [name] on April 26th   Follow-Up: Your physician recommends that you schedule a follow-up appointment in:   10-14 days for a wound check from 4/26 91 days with Dr [name] from 4/26  Any Other Special Instructions Will Be Listed Below (If Applicable).     If you need a refill on your cardiac medications before your next appointment, please call your pharmacy.

## 2017-07-24 NOTE — Progress Notes (Signed)
Patient Care Team: [name], MD as PCP - General (Family Medicine) [name], MD as Consulting Physician (Cardiology)   HPI  Frank [name] is a 72 y.o. male Seen in followup for hypertrophic cardiomyopathy,  status post myectomy with mechanical mitral valve replacement and aortic valve repair. 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 His device reached ERI.  With noise on his RV lead, he underwent CRT upgrade.  This was complicated by hematoma some erythema.  He presented today with expressible sanguinous purulent drainage    DATE TEST    4/17    echo   EF 55-60 % Severe LVH (18/17). BAE  PA press 62 RV normal  10/18 Echo  EF 45-50% Some segmental WMA Severe RVE, depressed EF LAE (63/2.1/33)     CPX test done about 2010 demonstrated a VO2 max of 12.2 representing 66.4% predicted. his VO2 adjusted for body weight was 25.9 His slope was 34 and his O2 pulse was 17, 88% of predicted.   Date Cr K Hgb  10/18 1.02  12.4            He underwent explant 06/29/17 with placement of temp perm  At no time has he been septic/ill  Reimplantation was delayed because of ongoing significant drainage and he comes intoday for recheck.  Expressible fluid is markedly diminished; no fevers chills INR 3.3       Past Medical History:  Diagnosis Date  . Atrial arrhythmia    status post ablation in [place] with complication including damage to the aortic valve  . Atrial fibrillation ([place])   . AV block, 1st degree   . Bradycardia   . Cluster headache ~ 1980   "went away after I started taking one of the heart RXs" (06/29/2017)  . Diastolic CHF, chronic ([place])   . Diverticulosis   . History of gout   . HTN (hypertension)   . Hypertrophic cardiomyopathy ([place])    s/p myoectomy  . Infection   . Internal hemorrhoids   . Morbid obesity ([place])   . Morbid obesity with BMI of 45.0-49.9, adult ([place])   . Pacemaker  -SJM   . Skin  cancer    "burned off RUE X 1; cut off nose X 2" (06/29/2017)  . TIA (transient ischemic attack) 2001  . Tubulovillous adenoma     Past Surgical History:  Procedure Laterality Date  . 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 BIV UPGRADE N/A 05/28/2017   Procedure: BIV UPGRADE;  Surgeon: [name], MD;  Location: [facility];  Service: Cardiovascular;  Laterality: N/A;  . CARDIAC CATHETERIZATION     "couple times" (06/29/2017)  . CARDIAC VALVE REPLACEMENT    . COLONOSCOPY    . COLONOSCOPY WITH PROPOFOL N/A 04/13/2016   Procedure: COLONOSCOPY WITH PROPOFOL;  Surgeon: Gatha Mayer, MD;  [facility];  Service: Endoscopy;  Laterality: N/A;  . INSERT / REPLACE / REMOVE PACEMAKER  ~ [address] Jude Accent   . 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 Jude  . PACEMAKER LEAD REMOVAL N/A 06/29/2017   Procedure: [id number]  REMOVAL AND Four LEAD REMOVAL;  Surgeon: Evans Lance, MD;  Location: [facility];  Service: Cardiovascular;  Laterality: N/A;  . PACEMAKER REMOVAL  06/29/2017   GERNERATOR  REMOVAL AND Four LEAD REMOVAL; put in external pacemaker (06/29/2017)  .  septal myectomy  07/22/1997  . SKIN CANCER EXCISION      Current Outpatient Medications  Medication Sig Dispense Refill  . Ascorbic Acid (VITAMIN C) 1000 MG tablet Take 2,000 mg by mouth daily.      Marland Kitchen aspirin 81 MG tablet Take 81 mg by mouth daily.    Marland Kitchen COUMADIN 10 MG tablet TAKE AS DIRECTED BY COUMADIN CLINIC 120 tablet 1  . diphenhydramine-acetaminophen (TYLENOL PM) 25-500 MG TABS tablet Take 2 tablets by mouth at bedtime as needed (for sleep).    . fish oil-omega-3 fatty acids 1000 MG capsule Take 4 g by mouth every morning.     Marland Kitchen losartan (COZAAR) 100 MG tablet Take 1 tablet (100 mg total) by mouth daily. 90 tablet 3  . Magnesium Oxide 250 MG TABS Take 1 tablet (250 mg total) by mouth 3 (three) times daily. 270 tablet 3  . Multiple  Vitamin (MULTIVITAMIN) tablet Take 1 tablet by mouth daily.      . potassium chloride (K-DUR,KLOR-CON) 10 MEQ tablet Take 9 tablets (90 meq) by mouth once daily    . torsemide (DEMADEX) 20 MG tablet Take 2 tablets (40 mg total) by mouth 2 (two) times daily. 360 tablet 3  . spironolactone (ALDACTONE) 25 MG tablet Take 1 tablet (25 mg total) by mouth daily. 30 tablet 0   No current facility-administered medications for this visit.     Allergies  Allergen Reactions  . Warfarin And Related Other (See Comments)    Per the patient's wife, "Warfain made him have a TIA" CAN TAKE BRAND NAME COUMADIN   . Lovenox [Enoxaparin Sodium] Other (See Comments)    Causes large lumps at injection site    Review of Systems negative except from HPI and PMH  Physical Exam BP (!) 130/56   Pulse 72   Ht 5\' 11"  (1.803 m)   Wt (!) 324 lb (147 kg)   SpO2 92%   BMI 45.19 kg/m  Wound expressed and 12' wick placed    Assessment and  Plan  Atrial fibrillation-paroxysmal  Mitral valve replacement-mechanical  Hypertrophic cardiomyopathy status post myectomy  HFpEF  Complete heart block  VT  Nonsustained   Hypoxemia  Right heart failure-new  Left ventricular dysfunction-new  Device infection status post explantation with ongoing drainage from the explantation site  Temporary permanent pacemaker    Plan is tentative reimplantation 4/26  Will target an INR low 2's with upswing pending Will review wound next tues when they return from [place]

## 2017-07-24 NOTE — Telephone Encounter (Signed)
Spoke with patient, f/u BMET looks good, he will stay on reduced dose of K+ (55meq total) and recheck BMEt Monday again.  Tommye Standard, PA-C

## 2017-07-24 NOTE — Patient Instructions (Signed)
Description   Continue taking 10mg  daily except 15mg  on Sundays and Wednesdays.  Recheck INR in 2 weeks. Call with any concerns any new medications or if scheduled for any procedures  [phone]

## 2017-07-27 ENCOUNTER — Other Ambulatory Visit: Payer: Self-pay

## 2017-07-27 ENCOUNTER — Ambulatory Visit (HOSPITAL_COMMUNITY)
Admission: EM | Disposition: A | Discharge status: Home / Self Care | Payer: Self-pay | Source: Home / Self Care | Attending: Emergency Medicine

## 2017-07-27 ENCOUNTER — Emergency Department (HOSPITAL_COMMUNITY): Payer: Medicare Other

## 2017-07-27 ENCOUNTER — Observation Stay (HOSPITAL_COMMUNITY)
Admission: EM | Admit: 2017-07-27 | Discharge: 2017-07-28 | Disposition: A | Discharge status: Home / Self Care | Payer: Medicare Other | Attending: Internal Medicine | Admitting: Internal Medicine

## 2017-07-27 ENCOUNTER — Encounter (HOSPITAL_COMMUNITY): Payer: Self-pay | Admitting: Emergency Medicine

## 2017-07-27 DIAGNOSIS — Z87891 Personal history of nicotine dependence: Secondary | ICD-10-CM | POA: Insufficient documentation

## 2017-07-27 DIAGNOSIS — I421 Obstructive hypertrophic cardiomyopathy: Secondary | ICD-10-CM | POA: Diagnosis not present

## 2017-07-27 DIAGNOSIS — R531 Weakness: Secondary | ICD-10-CM | POA: Diagnosis not present

## 2017-07-27 DIAGNOSIS — R079 Chest pain, unspecified: Secondary | ICD-10-CM | POA: Diagnosis not present

## 2017-07-27 DIAGNOSIS — I110 Hypertensive heart disease with heart failure: Secondary | ICD-10-CM | POA: Diagnosis not present

## 2017-07-27 DIAGNOSIS — Z952 Presence of prosthetic heart valve: Secondary | ICD-10-CM | POA: Diagnosis not present

## 2017-07-27 DIAGNOSIS — Z8673 Personal history of transient ischemic attack (TIA), and cerebral infarction without residual deficits: Secondary | ICD-10-CM | POA: Insufficient documentation

## 2017-07-27 DIAGNOSIS — Z6841 Body Mass Index (BMI) 40.0 and over, adult: Secondary | ICD-10-CM | POA: Insufficient documentation

## 2017-07-27 DIAGNOSIS — Z7982 Long term (current) use of aspirin: Secondary | ICD-10-CM | POA: Insufficient documentation

## 2017-07-27 DIAGNOSIS — Z8249 Family history of ischemic heart disease and other diseases of the circulatory system: Secondary | ICD-10-CM | POA: Insufficient documentation

## 2017-07-27 DIAGNOSIS — R001 Bradycardia, unspecified: Secondary | ICD-10-CM | POA: Diagnosis not present

## 2017-07-27 DIAGNOSIS — Z7901 Long term (current) use of anticoagulants: Secondary | ICD-10-CM | POA: Insufficient documentation

## 2017-07-27 DIAGNOSIS — I442 Atrioventricular block, complete: Secondary | ICD-10-CM | POA: Diagnosis not present

## 2017-07-27 DIAGNOSIS — I5042 Chronic combined systolic (congestive) and diastolic (congestive) heart failure: Secondary | ICD-10-CM | POA: Diagnosis present

## 2017-07-27 DIAGNOSIS — G4733 Obstructive sleep apnea (adult) (pediatric): Secondary | ICD-10-CM | POA: Insufficient documentation

## 2017-07-27 DIAGNOSIS — Z950 Presence of cardiac pacemaker: Secondary | ICD-10-CM

## 2017-07-27 DIAGNOSIS — T82110A Breakdown (mechanical) of cardiac electrode, initial encounter: Secondary | ICD-10-CM | POA: Diagnosis not present

## 2017-07-27 HISTORY — PX: BIV PACEMAKER INSERTION CRT-P: EP1199

## 2017-07-27 LAB — PROTIME-INR
INR: 2.74
PROTHROMBIN TIME: 28.8 s — AB (ref 11.4–15.2)

## 2017-07-27 LAB — CBC
HEMATOCRIT: 35.9 % — AB (ref 39.0–52.0)
Hemoglobin: 12 g/dL — ABNORMAL LOW (ref 13.0–17.0)
MCH: 34.1 pg — ABNORMAL HIGH (ref 26.0–34.0)
MCHC: 33.4 g/dL (ref 30.0–36.0)
MCV: 102 fL — AB (ref 78.0–100.0)
Platelets: 212 10*3/uL (ref 150–400)
RBC: 3.52 MIL/uL — ABNORMAL LOW (ref 4.22–5.81)
RDW: 15 % (ref 11.5–15.5)
WBC: 8 10*3/uL (ref 4.0–10.5)

## 2017-07-27 LAB — POCT I-STAT TROPONIN I: TROPONIN I, POC: 0.02 ng/mL (ref 0.00–0.08)

## 2017-07-27 LAB — BASIC METABOLIC PANEL
Anion gap: 12 (ref 5–15)
BUN: 27 mg/dL — AB (ref 6–20)
CHLORIDE: 101 mmol/L (ref 101–111)
CO2: 23 mmol/L (ref 22–32)
CREATININE: 1.2 mg/dL (ref 0.61–1.24)
Calcium: 9.2 mg/dL (ref 8.9–10.3)
GFR calc Af Amer: >60 mL/min (ref >60)
GFR calc non Af Amer: 60 mL/min — ABNORMAL LOW (ref >60)
Glucose, Bld: 109 mg/dL — ABNORMAL HIGH (ref 65–99)
POTASSIUM: 4.1 mmol/L (ref 3.5–5.1)
Sodium: 136 mmol/L (ref 135–145)

## 2017-07-27 LAB — TROPONIN I: Troponin I: 0.04 ng/mL (ref <0.03)

## 2017-07-27 SURGERY — BIV PACEMAKER INSERTION CRT-P

## 2017-07-27 MED ORDER — POTASSIUM CHLORIDE CRYS ER 20 MEQ PO TBCR
50.0000 meq | EXTENDED_RELEASE_TABLET | Freq: Every day | ORAL | Status: DC
  Administered 2017-07-28: 50 meq via ORAL
  Filled 2017-07-27: qty 2

## 2017-07-27 MED ORDER — OMEGA-3-ACID ETHYL ESTERS 1 G PO CAPS
4.0000 g | ORAL_CAPSULE | Freq: Every day | ORAL | Status: DC
  Administered 2017-07-27 – 2017-07-28 (×2): 4 g via ORAL
  Filled 2017-07-27 (×2): qty 4

## 2017-07-27 MED ORDER — LOSARTAN POTASSIUM 50 MG PO TABS
100.0000 mg | ORAL_TABLET | Freq: Every day | ORAL | Status: DC
  Administered 2017-07-27 – 2017-07-28 (×2): 100 mg via ORAL
  Filled 2017-07-27 (×2): qty 2

## 2017-07-27 MED ORDER — CHLORHEXIDINE GLUCONATE 4 % EX LIQD: 60.0000 mL | Freq: Once | CUTANEOUS | Status: DC

## 2017-07-27 MED ORDER — ASPIRIN EC 81 MG PO TBEC
81.0000 mg | DELAYED_RELEASE_TABLET | Freq: Every day | ORAL | Status: DC
  Administered 2017-07-27 – 2017-07-28 (×2): 81 mg via ORAL
  Filled 2017-07-27 (×2): qty 1

## 2017-07-27 MED ORDER — IOPAMIDOL (ISOVUE-370) INJECTION 76%
INTRAVENOUS | Status: AC
  Filled 2017-07-27: qty 50

## 2017-07-27 MED ORDER — DIPHENHYDRAMINE HCL 25 MG PO CAPS: 25.0000 mg | ORAL_CAPSULE | Freq: Every evening | ORAL | Status: DC | PRN

## 2017-07-27 MED ORDER — HEPARIN (PORCINE) IN NACL 1000-0.9 UT/500ML-% IV SOLN
INTRAVENOUS | Status: AC
  Filled 2017-07-27: qty 500

## 2017-07-27 MED ORDER — CEFAZOLIN SODIUM-DEXTROSE 2-3 GM-%(50ML) IV SOLR
INTRAVENOUS | Status: AC | PRN
  Administered 2017-07-27: 3 g via INTRAVENOUS

## 2017-07-27 MED ORDER — ACETAMINOPHEN 325 MG PO TABS
650.0000 mg | ORAL_TABLET | Freq: Four times a day (QID) | ORAL | Status: DC | PRN
  Administered 2017-07-27: 650 mg via ORAL
  Filled 2017-07-27: qty 2

## 2017-07-27 MED ORDER — SODIUM CHLORIDE 0.9 % IV SOLN
INTRAVENOUS | Status: DC | PRN
  Administered 2017-07-27: 12:00:00

## 2017-07-27 MED ORDER — VITAMIN C 500 MG PO TABS
2000.0000 mg | ORAL_TABLET | Freq: Every day | ORAL | Status: DC
  Administered 2017-07-27 – 2017-07-28 (×2): 2000 mg via ORAL
  Filled 2017-07-27 (×2): qty 4

## 2017-07-27 MED ORDER — HEPARIN (PORCINE) IN NACL 2-0.9 UNITS/ML
INTRAMUSCULAR | Status: AC | PRN
  Administered 2017-07-27 (×2): 500 mL

## 2017-07-27 MED ORDER — TORSEMIDE 20 MG PO TABS
40.0000 mg | ORAL_TABLET | ORAL | Status: DC
  Administered 2017-07-27 – 2017-07-28 (×2): 40 mg via ORAL
  Filled 2017-07-27 (×2): qty 2

## 2017-07-27 MED ORDER — MAGNESIUM OXIDE 400 (241.3 MG) MG PO TABS
400.0000 mg | ORAL_TABLET | Freq: Three times a day (TID) | ORAL | Status: DC
  Administered 2017-07-27 – 2017-07-28 (×3): 400 mg via ORAL
  Filled 2017-07-27 (×3): qty 1

## 2017-07-27 MED ORDER — FENTANYL CITRATE (PF) 100 MCG/2ML IJ SOLN
INTRAMUSCULAR | Status: AC
  Filled 2017-07-27: qty 2

## 2017-07-27 MED ORDER — WARFARIN SODIUM 7.5 MG PO TABS
7.5000 mg | ORAL_TABLET | Freq: Once | ORAL | Status: AC
  Administered 2017-07-27: 7.5 mg via ORAL
  Filled 2017-07-27: qty 1

## 2017-07-27 MED ORDER — MIDAZOLAM HCL 5 MG/5ML IJ SOLN
INTRAMUSCULAR | Status: AC
  Filled 2017-07-27: qty 5

## 2017-07-27 MED ORDER — WARFARIN - PHYSICIAN DOSING INPATIENT
Freq: Every day | Status: DC
  Administered 2017-07-27: 18:00:00

## 2017-07-27 MED ORDER — LIDOCAINE HCL (PF) 1 % IJ SOLN
INTRAMUSCULAR | Status: AC
  Filled 2017-07-27: qty 30

## 2017-07-27 MED ORDER — SODIUM CHLORIDE 0.9 % IV SOLN: INTRAVENOUS | Status: DC

## 2017-07-27 MED ORDER — SODIUM CHLORIDE 0.9 % IV SOLN
80.0000 mg | INTRAVENOUS | Status: AC
  Filled 2017-07-27: qty 2

## 2017-07-27 MED ORDER — ADULT MULTIVITAMIN W/MINERALS CH
1.0000 | ORAL_TABLET | Freq: Every day | ORAL | Status: DC
  Administered 2017-07-27 – 2017-07-28 (×2): 1 via ORAL
  Filled 2017-07-27 (×2): qty 1

## 2017-07-27 MED ORDER — OMEGA-3 FATTY ACIDS 1000 MG PO CAPS: 4.0000 g | ORAL_CAPSULE | Freq: Every morning | ORAL | Status: DC

## 2017-07-27 MED ORDER — DEXTROSE 5 % IV SOLN
3.0000 g | INTRAVENOUS | Status: AC
  Filled 2017-07-27: qty 3000

## 2017-07-27 MED ORDER — FENTANYL CITRATE (PF) 100 MCG/2ML IJ SOLN
INTRAMUSCULAR | Status: DC | PRN
  Administered 2017-07-27 (×3): 25 ug via INTRAVENOUS
  Administered 2017-07-27: 50 ug via INTRAVENOUS
  Administered 2017-07-27 (×2): 25 ug via INTRAVENOUS
  Administered 2017-07-27: 50 ug via INTRAVENOUS
  Administered 2017-07-27: 25 ug via INTRAVENOUS

## 2017-07-27 MED ORDER — DIPHENHYDRAMINE-APAP (SLEEP) 25-500 MG PO TABS: 2.0000 | ORAL_TABLET | Freq: Every evening | ORAL | Status: DC | PRN

## 2017-07-27 MED ORDER — LIDOCAINE HCL (PF) 1 % IJ SOLN
INTRAMUSCULAR | Status: AC
  Filled 2017-07-27: qty 60

## 2017-07-27 MED ORDER — OXYCODONE HCL 5 MG PO TABS
5.0000 mg | ORAL_TABLET | Freq: Four times a day (QID) | ORAL | Status: DC | PRN
  Administered 2017-07-28: 5 mg via ORAL
  Filled 2017-07-27: qty 1

## 2017-07-27 MED ORDER — MIDAZOLAM HCL 5 MG/5ML IJ SOLN
INTRAMUSCULAR | Status: DC | PRN
  Administered 2017-07-27 (×2): 1 mg via INTRAVENOUS
  Administered 2017-07-27: 2 mg via INTRAVENOUS
  Administered 2017-07-27: 1 mg via INTRAVENOUS
  Administered 2017-07-27: 2 mg via INTRAVENOUS
  Administered 2017-07-27 (×3): 1 mg via INTRAVENOUS

## 2017-07-27 MED ORDER — SPIRONOLACTONE 25 MG PO TABS
25.0000 mg | ORAL_TABLET | Freq: Every day | ORAL | Status: DC
  Administered 2017-07-27 – 2017-07-28 (×2): 25 mg via ORAL
  Filled 2017-07-27 (×2): qty 1

## 2017-07-27 MED ORDER — IOPAMIDOL (ISOVUE-370) INJECTION 76%
INTRAVENOUS | Status: DC | PRN
  Administered 2017-07-27: 5 mL via INTRAVENOUS
  Administered 2017-07-27: 20 mL via INTRAVENOUS
  Administered 2017-07-27: 15 mL via INTRAVENOUS

## 2017-07-27 MED ORDER — SODIUM CHLORIDE 0.9 % IV SOLN
INTRAVENOUS | Status: AC
  Filled 2017-07-27: qty 2

## 2017-07-27 MED ORDER — LIDOCAINE HCL (PF) 1 % IJ SOLN
INTRAMUSCULAR | Status: DC | PRN
  Administered 2017-07-27: 90 mL

## 2017-07-27 SURGICAL SUPPLY — 26 items
ADAPTER SEALING SSA-EW-09 (MISCELLANEOUS) ×1 IMPLANT
ADPR INTRO LNG 9FR SL XTD WNG (MISCELLANEOUS) ×1
ALLURE CRT PM3262 (Pacemaker) ×2 IMPLANT
BALLN COR SINUS VENO 6FR 80 (BALLOONS) ×2
BALLOON COR SINUS VENO 6FR 80 (BALLOONS) IMPLANT
CABLE SURGICAL S-101-97-12 (CABLE) ×2 IMPLANT
CATH ATTAIN SEL SURV 6248V-90 (CATHETERS) ×1 IMPLANT
CATH CPS DIRECT 135 DS2C020 (CATHETERS) ×1 IMPLANT
CATH CPS QUART CN DS2N029-65 (CATHETERS) ×1 IMPLANT
CATH RIGHTSITE C315HIS02 (CATHETERS) ×1 IMPLANT
HEMOSTAT SURGICEL 2X4 FIBR (HEMOSTASIS) ×2 IMPLANT
HOVERMATT SINGLE USE (MISCELLANEOUS) ×1 IMPLANT
KIT ESSENTIALS PG (KITS) ×1 IMPLANT
LEAD CAPSURE NOVUS 5076-52CM (Lead) ×1 IMPLANT
LEAD CAPSURE NOVUS 5076-58CM (Lead) ×1 IMPLANT
LEAD QUARTET 1458Q-86CM (Lead) ×1 IMPLANT
PACEMAKER ALLURE CRT (Pacemaker) IMPLANT
PAD DEFIB LIFELINK (PAD) ×1 IMPLANT
SHEATH CLASSIC 7F (SHEATH) ×2 IMPLANT
SHEATH CLASSIC 8F (SHEATH) ×1 IMPLANT
SHEATH CLASSIC 9.5F (SHEATH) ×1 IMPLANT
SLITTER 6232ADJ (MISCELLANEOUS) ×1 IMPLANT
SLITTER UNIVERSAL DS2A003 (MISCELLANEOUS) ×1 IMPLANT
TRAY PACEMAKER INSERTION (PACKS) ×1 IMPLANT
WIRE ACUITY WHISPER EDS 4648 (WIRE) ×1 IMPLANT
WIRE HI TORQ VERSACORE-J 145CM (WIRE) ×1 IMPLANT

## 2017-07-27 NOTE — ED Provider Notes (Signed)
Patient presented to the ER with weakness.  Face to face Exam: HEENT - PERRLA Lungs - CTAB Heart -regular, bradycardic, no M/R/G Abd - S/NT/ND Neuro - alert, oriented x3  Plan: Patient has a pacemaker that had to be removed secondary to pacemaker pocket infection.  His pacemaker generator is externally mounted and it appears that the lead has accidentally been dislodged, causing failure.  Consult cardiology.   Orpah Greek, MD 07/27/17 [phone]

## 2017-07-27 NOTE — ED Notes (Signed)
Pt on Zoll device with pads in place.

## 2017-07-27 NOTE — ED Notes (Signed)
2nd IV started, IVF running, consent signed and chest shaved for cath lab prep

## 2017-07-27 NOTE — H&P (Signed)
Patient Care Team: [name], MD as PCP - General (Family Medicine) [name], MD as Consulting Physician (Cardiology)   HPI  Frank [name] is a 72 y.o. male Seen for bradycardia   He had undergone CRT explant 2/2 infection in 3/19 with pllacing of temp perm  THis am noted HR 30's and CXR macro lead dislodgement  He has hypertrophic cardiomyopathy,  status post myectomy with mechanical mitral valve replacement and aortic valve repair. 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 His device reached ERI.  With noise on his RV lead, he underwent CRT upgrade.  and subsequent hematoma and infection with explantation     DATE TEST    4/17    echo   EF 55-60 % Severe LVH (18/17). BAE  PA press 62 RV normal  10/18 Echo  EF 45-50% Some segmental WMA Severe RVE, depressed EF LAE (63/2.1/33)     CPX test done about 2010 demonstrated a VO2 max of 12.2 representing 66.4% predicted. his VO2 adjusted for body weight was 25.9 His slope was 34 and his O2 pulse was 17, 88% of predicted.   Date Cr K Hgb  10/18 1.02  12.4                Past Medical History:  Diagnosis Date  . Atrial arrhythmia    status post ablation in [place] with complication including damage to the aortic valve  . Atrial fibrillation ([place])   . AV block, 1st degree   . Bradycardia   . Cluster headache ~ 1980   "went away after I started taking one of the heart RXs" (06/29/2017)  . Diastolic CHF, chronic ([place])   . Diverticulosis   . History of gout   . HTN (hypertension)   . Hypertrophic cardiomyopathy ([place])    s/p myoectomy  . Infection   . Internal hemorrhoids   . Morbid obesity ([place])   . Morbid obesity with BMI of 45.0-49.9, adult ([place])   . Pacemaker  -SJM   . Skin cancer    "burned off RUE X 1; cut off nose X 2" (06/29/2017)  . TIA (transient ischemic attack) 2001  . Tubulovillous adenoma     Past Surgical History:    Procedure Laterality Date  . 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 BIV UPGRADE N/A 05/28/2017   Procedure: BIV UPGRADE;  Surgeon: [name], MD;  Location: [facility];  Service: Cardiovascular;  Laterality: N/A;  . CARDIAC CATHETERIZATION     "couple times" (06/29/2017)  . CARDIAC VALVE REPLACEMENT    . COLONOSCOPY    . COLONOSCOPY WITH PROPOFOL N/A 04/13/2016   Procedure: COLONOSCOPY WITH PROPOFOL;  Surgeon: Gatha Mayer, MD;  [facility];  Service: Endoscopy;  Laterality: N/A;  . INSERT / REPLACE / REMOVE PACEMAKER  ~ [address] Jude Accent   . 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 Jude  . PACEMAKER LEAD REMOVAL N/A 06/29/2017   Procedure: [id number]  REMOVAL AND Four LEAD REMOVAL;  Surgeon: Evans Lance, MD;  Location: [facility];  Service: Cardiovascular;  Laterality: N/A;  . PACEMAKER REMOVAL  06/29/2017   GERNERATOR  REMOVAL AND Four LEAD REMOVAL; put in external pacemaker (06/29/2017)  . septal myectomy  07/22/1997  . SKIN CANCER EXCISION      No current facility-administered  medications for this encounter.    Current Outpatient Medications  Medication Sig Dispense Refill  . Ascorbic Acid (VITAMIN C) 1000 MG tablet Take 2,000 mg by mouth daily.      Marland Kitchen aspirin 81 MG tablet Take 81 mg by mouth daily.    Marland Kitchen COUMADIN 10 MG tablet TAKE AS DIRECTED BY COUMADIN CLINIC 120 tablet 1  . diphenhydramine-acetaminophen (TYLENOL PM) 25-500 MG TABS tablet Take 2 tablets by mouth at bedtime as needed (for sleep).    . fish oil-omega-3 fatty acids 1000 MG capsule Take 4 g by mouth every morning.     Marland Kitchen losartan (COZAAR) 100 MG tablet Take 1 tablet (100 mg total) by mouth daily. 90 tablet 3  . Magnesium Oxide 250 MG TABS Take 1 tablet (250 mg total) by mouth 3 (three) times daily. 270 tablet 3  . Multiple Vitamin (MULTIVITAMIN) tablet Take 1 tablet by mouth daily.      . potassium chloride  (K-DUR,KLOR-CON) 10 MEQ tablet Take 9 tablets (90 meq) by mouth once daily    . spironolactone (ALDACTONE) 25 MG tablet Take 1 tablet (25 mg total) by mouth daily. 30 tablet 0  . torsemide (DEMADEX) 20 MG tablet Take 2 tablets (40 mg total) by mouth 2 (two) times daily. 360 tablet 3    Allergies  Allergen Reactions  . Warfarin And Related Other (See Comments)    Per the patient's wife, "Warfain made him have a TIA" CAN TAKE BRAND NAME COUMADIN   . Lovenox [Enoxaparin Sodium] Other (See Comments)    Causes large lumps at injection site      Social History   Tobacco Use  . Smoking status: Former Smoker    Years: 10.00    Types: Cigarettes, Pipe, Cigars    Last attempt to quit: 04/10/1977    Years since quitting: 40.3  . Smokeless tobacco: Never Used  Substance Use Topics  . Alcohol use: Yes    Alcohol/week: 1.2 oz    Types: 1 Glasses of wine, 1 Cans of beer per week  . Drug use: No     Family History  Problem Relation Age of Onset  . Heart disease Sister   . Uterine cancer Sister   . Obesity Sister        500+ lbs  . Heart disease Brother   . Heart disease Sister   . Heart disease Father   . Colon cancer Father 71  . Crohn's disease Paternal Grandmother      No outpatient medications have been marked as taking for the 07/27/17 encounter [facility] Encounter).     Review of Systems negative except from HPI and PMH  Physical Exam BP 123/63   Pulse (!) 41   Temp 97.8 F (36.6 C) (Oral)   Resp 16   SpO2 94%  Well developed and well nourished in no acute distress HENT normal E scleral and icterus clear Neck Supple JVP flat; carotids brisk and full Clear to ausculation L sided pocket with some drainage but no erythema  Regular rate and rhythm, no murmurs gallops or rub Soft with active bowel sounds No clubbing cyanosis 1+ Edema Alert and oriented, grossly normal motor and sensory function Skin Warm and Dry  ECG sinus with complete heart block   Assessment  and  Plan Complete heart block  Temp perm with macro dislodgement  CRT-p with explant 3/19 with slowly healing site   AVR and MVR  On warfarin  INR 3.3 on 4/16  Repeat pending  Will need revision  Have spoken with GT and agree that new implant is possible but this option will be determined by INR  If > 3 will proceed with change out of temp perm lead and then reimplant next week as scheduled so we can let INR drift down, If < 3 will proceed with R sided implant and removal of perm pacing lead Will anticipate pressure dressings to his chagrin

## 2017-07-27 NOTE — ED Triage Notes (Addendum)
Pt has external pacemaker in that has he has had for 2 weeks per patient while he waits to have pacemaker placed. Pt was woken up feeling "weak" pt checked pulse and it was reading in the 40's.

## 2017-07-27 NOTE — ED Provider Notes (Addendum)
[facility] EMERGENCY DEPARTMENT Provider Note   CSN: [id number] Arrival date & time: 07/27/17  6294     History   Chief Complaint Chief Complaint  Patient presents with  . Pacemaker Problem    HPI Frank [name] is a 72 y.o. male.  The history is provided by the patient. No language interpreter was used.  Weakness  Primary symptoms include focal weakness. This is a new problem. The problem has not changed since onset.There was no focality noted. There has been no fever. Pertinent negatives include no shortness of breath and no chest pain.  Pt reports he has a pacemaker in his neck.  Pt reports his pacemaker pocket became infected and he had to have removed.  Pt's pacemaker is currently external.   Past Medical History:  Diagnosis Date  . Atrial arrhythmia    status post ablation in [place] with complication including damage to the aortic valve  . Atrial fibrillation ([place])   . AV block, 1st degree   . Bradycardia   . Cluster headache ~ 1980   "went away after I started taking one of the heart RXs" (06/29/2017)  . Diastolic CHF, chronic ([name])   . Diverticulosis   . History of gout   . HTN (hypertension)   . Hypertrophic cardiomyopathy ([place])    s/p myoectomy  . Infection   . Internal hemorrhoids   . Morbid obesity ([place])   . Morbid obesity with BMI of 45.0-49.9, adult ([place])   . Pacemaker  -SJM   . Skin cancer    "burned off RUE X 1; cut off nose X 2" (06/29/2017)  . TIA (transient ischemic attack) 2001  . Tubulovillous adenoma     Patient Active Problem List   Diagnosis Date Noted  . Pacemaker infection ([place]) 06/29/2017  . OSA (obstructive sleep apnea) 06/08/2017  . NICM (nonischemic cardiomyopathy) ([place]) 05/28/2017  . Acute pulmonary edema ([facility])   . Chronic combined systolic and diastolic heart failure ([place]) 01/18/2017  . Benign neoplasm of ascending colon   . Benign neoplasm of cecum   . Benign neoplasm of transverse colon   . Benign  neoplasm of sigmoid colon   . Hx of adenomatous colonic polyps 03/10/2016  . Dyspnea on exertion 07/12/2015  . Encounter for therapeutic drug monitoring 05/19/2013  . Cardiac pacemaker-St.Jude 06/22/2011  . 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 S/P mitral valve replacement-Mechanical 10/10/2010  . Atrial fibrillation/tachycardia 06/08/2010  . TIA (transient ischemic attack) 06/08/2010  . S/P aortic valve repair 06/08/2010  . DIASTOLIC HEART FAILURE, CHRONIC 08/10/2009  . VENTRICULAR TACHYCARDIA 11/23/2008  . MORBID OBESITY 06/10/2007  . Essential hypertension 06/10/2007  . Hypertrophic obstructive cardiomyopathy ([place]) 06/10/2007  . DYSPNEA 06/10/2007  . CT, CHEST, ABNORMAL 06/10/2007  . MELANOMA, HX OF 06/10/2007    Past Surgical History:  Procedure Laterality Date  . 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 BIV UPGRADE N/A 05/28/2017   Procedure: BIV UPGRADE;  Surgeon: [name], MD;  Location: [facility];  Service: Cardiovascular;  Laterality: N/A;  . CARDIAC CATHETERIZATION     "couple times" (06/29/2017)  . CARDIAC VALVE REPLACEMENT    . COLONOSCOPY    . COLONOSCOPY WITH PROPOFOL N/A 04/13/2016   Procedure: COLONOSCOPY WITH PROPOFOL;  Surgeon: Gatha Mayer, MD;  [facility];  Service: Endoscopy;  Laterality: N/A;  . INSERT / REPLACE / REMOVE PACEMAKER  ~ 2010  St Jude [employer]ent   . 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 Jude  . PACEMAKER LEAD REMOVAL N/A 06/29/2017   Procedure: [id number]  REMOVAL AND Four LEAD REMOVAL;  Surgeon: Evans Lance, MD;  Location: [facility];  Service: Cardiovascular;  Laterality: N/A;  . PACEMAKER REMOVAL  06/29/2017   GERNERATOR  REMOVAL AND Four LEAD REMOVAL; put in external pacemaker (06/29/2017)  . septal myectomy  07/22/1997  . SKIN CANCER EXCISION          Home Medications    Prior to Admission medications   Medication  Sig Start Date End Date Taking? Authorizing Provider  Ascorbic Acid (VITAMIN C) 1000 MG tablet Take 2,000 mg by mouth daily.      [provider]  aspirin 81 MG tablet Take 81 mg by mouth daily.    [provider]  COUMADIN 10 MG tablet TAKE AS DIRECTED BY COUMADIN CLINIC 07/16/17   [name], MD  diphenhydramine-acetaminophen (TYLENOL PM) 25-500 MG TABS tablet Take 2 tablets by mouth at bedtime as needed (for sleep).    [provider]  fish oil-omega-3 fatty acids 1000 MG capsule Take 4 g by mouth every morning.     [provider]  losartan (COZAAR) 100 MG tablet Take 1 tablet (100 mg total) by mouth daily. 02/07/17   [name] T, PA-C  Magnesium Oxide 250 MG TABS Take 1 tablet (250 mg total) by mouth 3 (three) times daily. 07/17/16   [name], MD  Multiple Vitamin (MULTIVITAMIN) tablet Take 1 tablet by mouth daily.      [provider]  potassium chloride (K-DUR,KLOR-CON) 10 MEQ tablet Take 9 tablets (90 meq) by mouth once daily    [provider]  spironolactone (ALDACTONE) 25 MG tablet Take 1 tablet (25 mg total) by mouth daily. 05/22/17 07/17/17  [name], MD  torsemide (DEMADEX) 20 MG tablet Take 2 tablets (40 mg total) by mouth 2 (two) times daily. 02/08/17   Liliane Shi, PA-C    Family History Family History  Problem Relation Age of Onset  . Heart disease Sister   . Uterine cancer Sister   . Obesity Sister        500+ lbs  . Heart disease Brother   . Heart disease Sister   . Heart disease Father   . Colon cancer Father 37  . Crohn's disease Paternal Grandmother     Social History Social History   Tobacco Use  . Smoking status: Former Smoker    Years: 10.00    Types: Cigarettes, Pipe, Cigars    Last attempt to quit: 04/10/1977    Years since quitting: 40.3  . Smokeless tobacco: Never Used  Substance Use Topics  . Alcohol use: Yes    Alcohol/week: 1.2 oz    Types: 1 Glasses of wine, 1 Cans of beer  per week  . Drug use: No     Allergies   Warfarin and related and Lovenox [enoxaparin sodium]   Review of Systems Review of Systems  Respiratory: Negative for shortness of breath.   Cardiovascular: Negative for chest pain.  Neurological: Positive for focal weakness and weakness.  All other systems reviewed and are negative.    Physical Exam Updated Vital Signs BP (!) 108/45 (BP Location: Right Arm)   Pulse (!) 39   Temp 97.8 F (36.6 C) (Oral)   Resp 20   SpO2 92%   Physical Exam  Constitutional: He appears well-developed  and well-nourished.  HENT:  Head: Normocephalic and atraumatic.  Eyes: Conjunctivae are normal.  Neck: Neck supple.  Cardiovascular: Normal rate and regular rhythm.  No murmur heard. Pulmonary/Chest: Effort normal and breath sounds normal. No respiratory distress.  Abdominal: There is no tenderness.  Musculoskeletal: He exhibits no edema.  Neurological: He is alert.  Skin: Skin is warm and dry.  Psychiatric: He has a normal mood and affect.  Nursing note and vitals reviewed.    ED Treatments / Results  Labs (all labs ordered are listed, but only abnormal results are displayed) Labs Reviewed  BASIC METABOLIC PANEL - Abnormal; Notable for the following components:      Result Value   Glucose, Bld 109 (*)    BUN 27 (*)    GFR calc non Af Amer 60 (*)    All other components within normal limits  CBC - Abnormal; Notable for the following components:   RBC 3.52 (*)    Hemoglobin 12.0 (*)    HCT 35.9 (*)    MCV 102.0 (*)    MCH 34.1 (*)    All other components within normal limits  TROPONIN I  I-STAT TROPONIN, ED    EKG None  Radiology Dg Chest 2 View  Result Date: 07/27/2017 CLINICAL DATA:  Acute onset of generalized chest pain and left shoulder pain. Failure of external pacemaker this morning. EXAM: CHEST - 2 VIEW COMPARISON:  Chest radiograph performed 06/30/2017 FINDINGS: The patient's pacemaker lead has retracted approximately 8  cm since the prior study, likely ending about the right atrium or tricuspid valve. The external pacemaker is again noted about the left chest wall. The lungs are well-aerated. Bibasilar airspace opacities may reflect mild interstitial edema. Vascular congestion is noted. Peribronchial thickening is seen. There is no evidence of pleural effusion or pneumothorax. The heart is mildly enlarged. The patient is status post median sternotomy. There is a chronic fracture of the superiormost sternal wire. No acute osseous abnormalities are seen. IMPRESSION: 1. Pacemaker lead has retracted approximately 8 cm since the prior study, likely ending about the right atrium or tricuspid valve. 2. Bibasilar airspace opacities may reflect mild interstitial edema. Peribronchial thickening seen. 3. Vascular congestion and mild cardiomegaly. These results were called by telephone at the time of interpretation on 07/27/2017 at 6:40 am to Dr. Joseph Berkshire, who verbally acknowledged these results. Electronically Signed   By: Garald Balding M.D.   On: 07/27/2017 06:43    Procedures Procedures (including critical care time)  Medications Ordered in ED Medications - No data to display   Initial Impression / Assessment and Plan / ED Course  I have reviewed the triage vital signs and the nursing notes.  Pertinent labs & imaging results that were available during my care of the patient were reviewed by me and considered in my medical decision making (see chart for details).  Clinical Course as of Jul 27 652  Fri Jul 27, 2017  0615 EKG 12-Lead [LS]    Clinical Course User Index [LS] Sidney Ace    I spoke to Dr. Cur[facility] Cardiology PA.  He will have Dr. [name] see.  Dr. [name] evaluated pt here and admitted for placement of pacemaker  Final Clinical Impressions(s) / ED Diagnoses   Final diagnoses:  Bradycardia    ED Discharge Orders    None       Sidney Ace 07/27/17 0701    Orpah Greek, MD 07/27/17 [place],  Hollace Kinnier, PA-C 07/27/17 0962    Orpah Greek, MD 07/28/17 [phone]

## 2017-07-28 ENCOUNTER — Inpatient Hospital Stay (HOSPITAL_COMMUNITY): Payer: Medicare Other

## 2017-07-28 DIAGNOSIS — I442 Atrioventricular block, complete: Secondary | ICD-10-CM | POA: Diagnosis not present

## 2017-07-28 DIAGNOSIS — Z4682 Encounter for fitting and adjustment of non-vascular catheter: Secondary | ICD-10-CM | POA: Diagnosis not present

## 2017-07-28 MED ORDER — ACETAMINOPHEN 325 MG PO TABS: 650.0000 mg | ORAL_TABLET | Freq: Four times a day (QID) | ORAL | Status: DC | PRN

## 2017-07-28 NOTE — Discharge Instructions (Signed)
Supplemental Discharge Instructions for  Pacemaker/Defibrillator Patients  Activity No heavy lifting or vigorous activity with your left/right arm for 6 to 8 weeks.  Do not raise your left/right arm above your head for one week.  Gradually raise your affected arm as drawn below.              07/31/17                    08/01/17                    08/02/17                   08/03/17 __  NO DRIVING for  1 week  ; you may begin driving on   7/90/24  .  WOUND CARE - Keep the wound area clean and dry.  Do not get this area wet, no showers until cleared to at your wound check visit - The tape/steri-strips on your wound will fall off; do not pull them off.  No bandage is needed on the site.  DO  NOT apply any creams, oils, or ointments to the wound area. - If you notice any drainage or discharge from the wound, any swelling or bruising at the site, or you develop a fever > 101? F after you are discharged home, call the office at once.  Special Instructions - You are still able to use cellular telephones; use the ear opposite the side where you have your pacemaker/defibrillator.  Avoid carrying your cellular phone near your device. - When traveling through airports, show security personnel your identification card to avoid being screened in the metal detectors.  Ask the security personnel to use the hand wand. - Avoid arc welding equipment, MRI testing (magnetic resonance imaging), TENS units (transcutaneous nerve stimulators).  Call the office for questions about other devices. - Avoid electrical appliances that are in poor condition or are not properly grounded. - Microwave ovens are safe to be near or to operate.  Additional information for defibrillator patients should your device go off: - If your device goes off ONCE and you feel fine afterward, notify the device clinic nurses. - If your device goes off ONCE and you do not feel well afterward, call 911. - If your device goes off TWICE,  call 911. - If your device goes off THREE times in one day, call 911.  DO NOT DRIVE YOURSELF OR A FAMILY MEMBER WITH A DEFIBRILLATOR TO THE HOSPITAL--CALL 911.  Information on my medicine - Coumadin   (Warfarin)  This medication education was reviewed with me or my healthcare representative as part of my discharge preparation.  The pharmacist that spoke with me during my hospital stay was:  Einar Grad, [facility]  Why was Coumadin prescribed for you? Coumadin was prescribed for you because you have a blood clot or a medical condition that can cause an increased risk of forming blood clots. Blood clots can cause serious health problems by blocking the flow of blood to the heart, lung, or brain. Coumadin can prevent harmful blood clots from forming. As a reminder your indication for Coumadin is:   Blood Clot Prevention After Heart Valve Surgery  What test will check on my response to Coumadin? While on Coumadin (warfarin) you will need to have an INR test regularly to ensure that your dose is keeping you in the desired range. The INR (international normalized ratio) number is calculated from the  result of the laboratory test called prothrombin time (PT).  If an INR APPOINTMENT HAS NOT ALREADY BEEN MADE FOR YOU please schedule an appointment to have this lab work done by your health care provider within 7 days. Your INR goal is usually a number between:  2 to 3 or your provider may give you a more narrow range like 2-2.5.  Ask your health care provider during an office visit what your goal INR is.  What  do you need to  know  About  COUMADIN? Take Coumadin (warfarin) exactly as prescribed by your healthcare provider about the same time each day.  DO NOT stop taking without talking to the doctor who prescribed the medication.  Stopping without other blood clot prevention medication to take the place of Coumadin may increase your risk of developing a new clot or stroke.  Get refills before you run  out.  What do you do if you miss a dose? If you miss a dose, take it as soon as you remember on the same day then continue your regularly scheduled regimen the next day.  Do not take two doses of Coumadin at the same time.  Important Safety Information A possible side effect of Coumadin (Warfarin) is an increased risk of bleeding. You should call your healthcare provider right away if you experience any of the following: ? Bleeding from an injury or your nose that does not stop. ? Unusual colored urine (red or dark brown) or unusual colored stools (red or black). ? Unusual bruising for unknown reasons. ? A serious fall or if you hit your head (even if there is no bleeding).  Some foods or medicines interact with Coumadin (warfarin) and might alter your response to warfarin. To help avoid this: ? Eat a balanced diet, maintaining a consistent amount of Vitamin K. ? Notify your provider about major diet changes you plan to make. ? Avoid alcohol or limit your intake to 1 drink for women and 2 drinks for men per day. (1 drink is 5 oz. wine, 12 oz. beer, or 1.5 oz. liquor.)  Make sure that ANY health care provider who prescribes medication for you knows that you are taking Coumadin (warfarin).  Also make sure the healthcare provider who is monitoring your Coumadin knows when you have started a new medication including herbals and non-prescription products.  Coumadin (Warfarin)  Major Drug Interactions  Increased Warfarin Effect Decreased Warfarin Effect  Alcohol (large quantities) Antibiotics (esp. Septra/Bactrim, Flagyl, Cipro) Amiodarone (Cordarone) Aspirin (ASA) Cimetidine (Tagamet) Megestrol (Megace) NSAIDs (ibuprofen, naproxen, etc.) Piroxicam (Feldene) Propafenone (Rythmol SR) Propranolol (Inderal) Isoniazid (INH) Posaconazole (Noxafil) Barbiturates (Phenobarbital) Carbamazepine (Tegretol) Chlordiazepoxide (Librium) Cholestyramine (Questran) Griseofulvin Oral  Contraceptives Rifampin Sucralfate (Carafate) Vitamin K   Coumadin (Warfarin) Major Herbal Interactions  Increased Warfarin Effect Decreased Warfarin Effect  Garlic Ginseng Ginkgo biloba Coenzyme Q10 Green tea St. Johns wort    Coumadin (Warfarin) FOOD Interactions  Eat a consistent number of servings per week of foods HIGH in Vitamin K (1 serving =  cup)  Collards (cooked, or boiled & drained) Kale (cooked, or boiled & drained) Mustard greens (cooked, or boiled & drained) Parsley *serving size only =  cup Spinach (cooked, or boiled & drained) Swiss chard (cooked, or boiled & drained) Turnip greens (cooked, or boiled & drained)  Eat a consistent number of servings per week of foods MEDIUM-HIGH in Vitamin K (1 serving = 1 cup)  Asparagus (cooked, or boiled & drained) Broccoli (cooked, boiled & drained, or raw &  chopped) Brussel sprouts (cooked, or boiled & drained) *serving size only =  cup Lettuce, raw (green leaf, endive, romaine) Spinach, raw Turnip greens, raw & chopped   These websites have more information on Coumadin (warfarin):  FailFactory.se; VeganReport.com.au;

## 2017-07-28 NOTE — Progress Notes (Signed)
Discharge instructions reviewed w/patient and wife.  Verbalized understanding.  PIV removed per protocol.  No RX given.  Patient and all belongings transported per wheelchair for discharge home

## 2017-07-28 NOTE — Progress Notes (Signed)
Error

## 2017-07-28 NOTE — Discharge Summary (Signed)
Discharge Summary    Patient ID: Frank [name],  MRN: [id number], DOB/AGE: 1945/09/05 72 y.o.  Admit date: 07/27/2017 Discharge date: 07/28/2017  Primary Care Provider: [name] Primary Cardiologist/Electrophysiologist: Dr. [name]  Discharge Diagnoses    Principal Problem:   Complete heart block Frank [name]) Active Problems:   MORBID OBESITY   Hypertrophic obstructive cardiomyopathy ([place])   S/P mitral valve replacement-Mechanical   Chronic combined systolic and diastolic heart failure ([facility])   Allergies Allergies  Allergen Reactions  . Warfarin And Related Other (See Comments)    Per the patient's wife, "Warfain made him have a TIA" CAN TAKE BRAND NAME COUMADIN   . Lovenox [Enoxaparin Sodium] Other (See Comments)    Causes large lumps at injection site    Diagnostic Studies/Procedures    BIV PACEMAKER INSERTION CRT-P  07/27/17  Procedural Details/Technique   Technical Details [name] [id number] @CSN @  Preop Dx: complete heart block, explanted device 2/2 infection dislodgement of temp permanet lead Postop Dx same/  Procedure:contrast venogram Dual chamber implant with LV lead placement and removal of permanent pacing lead   Cx: None   ICD Criteria   Dictation number [id number]   Following the placement of the new system the previous lead was removed under flourscopic guidance and was well tolerated   [name], MD 07/27/2017 12:36 PM   Estimated blood loss between 50 mL and 150 mL.  During this procedure the patient was administered the following to achieve and maintain moderate conscious sedation: Versed 10 mg, Fentanyl 250 mcg, while the patient's heart rate, blood pressure, and oxygen saturation were continuously monitored. The period of conscious sedation was 154 minutes, of which I was present face-to-face 100% of this time.     History of Present Illness     Frank [name] is a 72 y.o. male Hypertrophic cardiomyopathy s/p myectomy  with mechanical mitral valve replacement and aortic valve repair, CHB s/p upgrade to CRT device February 2019 as it reached ERI however his pocket became infected. He had undergone CRT explant 2/2 infection in 3/19 with pllacing of temp pecer.  He presented to ER 4/19 with weakness. No fever, CP of SOB. Noted HR 30's and macro lead dislodgement by CXR. The patient wad admitted for PPM. He takes coumadin for AVR & MVR. His INR noted to be 2.74.   Hospital Course     Consultants: None  The patient was taken to EP lab and underwent removal of previous lead under fluoroscopic guidance and Placement of BiV pacemaker insertion CRT-P. No overnight complications. CXR today without PTX and stable cardiomegaly.   The patient was discharged on home medications, including coumadin, and tylenol for pain management.  The patient has been seen by Dr. [name] today and deemed ready for discharge home. All follow-up appointments have been scheduled. Discharge medications are listed below.   Discharge Vitals Blood pressure (!) 116/48, pulse 63, temperature 97.7 F (36.5 C), temperature source Oral, resp. rate 15, SpO2 90 %.  There were no vitals filed for this visit.  Labs & Radiologic Studies     CBC Recent Labs    07/27/17 0527  WBC 8.0  HGB 12.0*  HCT 35.9*  MCV 102.0*  PLT 628   Basic Metabolic Panel Recent Labs    07/27/17 0527  NA 136  K 4.1  CL 101  CO2 23  GLUCOSE 109*  BUN 27*  CREATININE 1.20  CALCIUM 9.2   Cardiac Enzymes Recent Labs  07/27/17 0622  TROPONINI 0.04*    Dg Chest 2 View  Result Date: 07/28/2017 CLINICAL DATA:  Encounter for pacemaker placement, pt was unable to raise either of his arms up above his head. EXAM: CHEST - 2 VIEW COMPARISON:  05/30/2017 FINDINGS: Median sternotomy. LEFT-sided transvenous pacemaker has been removed. RIGHT-sided transvenous pacemaker has been placed, leads to the RIGHT atrium, RIGHT ventricle, and coronary sinus. The heart is  mildly enlarged. There are no focal consolidations or pleural effusions. Mildly prominent interstitial markings are stable. No pneumothorax. IMPRESSION: New RIGHT-sided transvenous pacemaker with leads as described. No pneumothorax. Stable cardiomegaly. Electronically Signed   By: Nolon Nations M.D.   On: 07/28/2017 11:19   Dg Chest 2 View  Result Date: 07/27/2017 CLINICAL DATA:  Acute onset of generalized chest pain and left shoulder pain. Failure of external pacemaker this morning. EXAM: CHEST - 2 VIEW COMPARISON:  Chest radiograph performed 06/30/2017 FINDINGS: The patient's pacemaker lead has retracted approximately 8 cm since the prior study, likely ending about the right atrium or tricuspid valve. The external pacemaker is again noted about the left chest wall. The lungs are well-aerated. Bibasilar airspace opacities may reflect mild interstitial edema. Vascular congestion is noted. Peribronchial thickening is seen. There is no evidence of pleural effusion or pneumothorax. The heart is mildly enlarged. The patient is status post median sternotomy. There is a chronic fracture of the superiormost sternal wire. No acute osseous abnormalities are seen. IMPRESSION: 1. Pacemaker lead has retracted approximately 8 cm since the prior study, likely ending about the right atrium or tricuspid valve. 2. Bibasilar airspace opacities may reflect mild interstitial edema. Peribronchial thickening seen. 3. Vascular congestion and mild cardiomegaly. These results were called by telephone at the time of interpretation on 07/27/2017 at 6:40 am to Dr. Joseph Berkshire, who verbally acknowledged these results. Electronically Signed   By: Garald Balding M.D.   On: 07/27/2017 06:43   Dg Chest 2 View  Result Date: 06/30/2017 CLINICAL DATA:  Pacemaker infection. EXAM: CHEST - 2 VIEW COMPARISON:  Chest x-ray dated May 29, 2017. FINDINGS: The lateral view is suboptimal due to overlapping arms. Interval pacemaker  exchange with new external pacemaker and a single lead terminating in the right ventricle. There is subcutaneous emphysema along the left upper chest wall at the site of prior pacemaker. Stable cardiomegaly. Mild central pulmonary vascular congestion and lower lobe predominant interstitial edema is similar to prior study. Mild left basilar subsegmental atelectasis. No focal consolidation, pleural effusion, or pneumothorax. No acute osseous abnormality. Unchanged fractured superior most sternal wire. IMPRESSION: 1. Stable vascular congestion and interstitial edema. 2. Interval pacemaker exchange.  No pneumothorax. Electronically Signed   By: Titus Dubin M.D.   On: 06/30/2017 10:46    Disposition   Pt is being discharged home today in good condition.  Follow-up Plans & Appointments    Follow-up Information    [place] [facility] Follow up on 08/06/2017.   Specialty:  Cardiology Why:  3:00PM, wound check visit Contact information: [address] [place]       [name], MD Follow up on 10/30/2017.   Specialty:  Cardiology Why:  3:15PM Contact information: [address] [address] [phone]          Discharge Instructions    Diet - low sodium heart healthy   Complete by:  As directed    Increase activity slowly   Complete by:  As directed      Discharge Medications   Allergies as of 07/28/2017      Reactions   Warfarin And Related Other (See Comments)   Per the patient's wife, "Warfain made him have a TIA" CAN TAKE BRAND NAME COUMADIN    Lovenox [enoxaparin Sodium] Other (See Comments)   Causes large lumps at injection site      Medication List    TAKE these medications   acetaminophen 325 MG tablet Commonly known as:  TYLENOL Take 2 tablets (650 mg total) by mouth every 6 (six) hours as needed for mild pain.   aspirin 81 MG tablet Take 81 mg by mouth  daily.   COUMADIN 10 MG tablet Generic drug:  warfarin Take as directed. If you are unsure how to take this medication, talk to your nurse or doctor. Original instructions:  TAKE AS DIRECTED BY COUMADIN CLINIC   diphenhydramine-acetaminophen 25-500 MG Tabs tablet Commonly known as:  TYLENOL PM Take 2 tablets by mouth at bedtime as needed (for sleep).   fish oil-omega-3 fatty acids 1000 MG capsule Take 4 g by mouth every morning.   losartan 100 MG tablet Commonly known as:  COZAAR Take 1 tablet (100 mg total) by mouth daily.   Magnesium Oxide 250 MG Tabs Take 1 tablet (250 mg total) by mouth 3 (three) times daily.   multivitamin tablet Take 1 tablet by mouth daily.   potassium chloride 10 MEQ tablet Commonly known as:  K-DUR,KLOR-CON Take 9 tablets (90 meq) by mouth once daily   spironolactone 25 MG tablet Commonly known as:  ALDACTONE Take 1 tablet (25 mg total) by mouth daily.   torsemide 20 MG tablet Commonly known as:  DEMADEX Take 2 tablets (40 mg total) by mouth 2 (two) times daily.   vitamin C 1000 MG tablet Take 2,000 mg by mouth daily.          Outstanding Labs/Studies   None  Duration of Discharge Encounter   Greater than 30 minutes including physician time.  Signed, Tami Lin  PA-C 07/28/2017, 12:44 PM  Pt seen and examined  Instructions given Device funciton normal, possible microdislodgement given challenges with pacing vectors CXR ok  Home today on coumadin 7.5 mg x 4 days then resume standard dose

## 2017-07-30 ENCOUNTER — Other Ambulatory Visit: Payer: Medicare Other

## 2017-07-30 MED FILL — Heparin Sod (Porcine)-NaCl IV Soln 1000 Unit/500ML-0.9%: INTRAVENOUS | Qty: 1000 | Status: AC

## 2017-07-30 NOTE — Op Note (Signed)
Frank [name], Frank [name]               ACCOUNT NO.:  [id number]  MEDICAL RECORD NO.:  [id number]  LOCATION:                                 FACILITY:  PHYSICIAN:  [name], MD, [facility]   DATE OF BIRTH:  DATE OF PROCEDURE:  07/27/2017 DATE OF DISCHARGE:                              OPERATIVE REPORT   PREOPERATIVE DIAGNOSIS:  Complete heart block, previously explanted CRT- P secondary to infection with placement of a temporary permanent lead which then today failed to capture.  POSTOPERATIVE DIAGNOSIS:  Complete heart block, previously explanted CRT- P secondary to infection with placement of a temporary permanent lead which then today failed to capture.  PROCEDURES:  Contrast venogram, dual-chamber pacemaker implantation with left ventricular lead placement, and extraction of a permanent pacing lead.  Following obtaining informed consent, the patient was brought to Electrophysiology Laboratory and placed on the fluoroscopic table in supine position.  After routine prep and drape of the right upper chest, contrast venogram was undertaken which demonstrated the patency in the course of the extrathoracic right subclavian vein.  This having been accomplished, lidocaine was infiltrated in the prepectoral groove and an incision was made and carried down to layer of the prepectoral fascia with electrocautery and sharp dissection.  A pocket was formed similarly.  Hemostasis was obtained.  Thereafter, attention was turned to gain access to the extrathoracic right subclavian vein, which was accomplished with moderate difficulty with puncture of the artery x2 with pressure held x3 minutes on each occasion.  Subsequently, 2 venipunctures were accomplished and a double wire technique was used for the first, which allowed for the sequential passage of a 7, a 9.5, and a 7-French sheath through which we passed a Medtronic 5076 58-cm active fixation ventricular lead, serial number [id number],  a St. Jude CS135 cannulation catheter and a Medtronic 5076 right atrial lead, serial number [id number].  The patient had significant tricuspid regurgitation, which made placement of the RV lead very, very difficult.  We were finally able to get it to deploy on the distal septum.  In this location, the sensed R- wave was 11.7 with a pace impedance of 448, a threshold of 0.6 V at 0.5 milliseconds.  There was no diaphragmatic pacing at 10 V and the current of injury was moderate.  This lead was secured to the prepectoral fascia.  We then attempted to gain access to the coronary sinus, which was moderately difficult.  A venogram demonstrated a vein that had its takeoff just past a valve.  We ended up using an Attain II 90-degree sheath to allow placement of the wire and then with more serendipity than anything else.  The lead deployed into its location at a location between the mid and distal third.  The lead was a St. Jude 1458-lead, serial number [id number].  There was diaphragmatic pacing off the distal tip, but not off the other poles.  This lead was secured to the prepectoral fascia.  The CS delivery system have been removed.  The atrial lead was then deployed.  Multiple sites were mapped and ultimately the lead was placed on the right atrial free wall.  In this location, the bipolar  P-wave was 0.9 with impedance of 431, a threshold of 2.2 V at 0.5 milliseconds.  I should note that in the LV 1-2 configuration, impedance was 769 and threshold was 3 V at 0.5.  With these acceptable parameters recorded, this all having taken close to 3 hours, we then attached the leads to a St. Jude Allure pulse generator, serial number [id number].  AV pacing was identified with the LV first of -50.  The QRS was upright in lead V1 and negative in lead 1.  This was chosen.  Hemostasis was assured.  A long time was spent to assure hemostasis.  The leads and pulse generator were placed in the pocket.  The  pocket was copiously irrigated with antibiotic-containing saline solution.  Surgicel was placed on the cephalad and lateral aspect of the pocket.  The wound was then closed in 3 layers in a normal fashion.  The wound was washed, dried and a Steri- Strip dressing was applied.  Because of the patient's INR of 2.7, we had justified given the fact the patient had 2 artificial valves, we then placed a cutaneous pressure dressing.  The patient tolerated the procedure with  Dictation ended at this point.     [name], MD, FACC   ______________________________ [name], MD, [facility]    SCK/MEDQ  D:  07/27/2017  T:  07/28/2017  Job:  [id number]

## 2017-07-31 ENCOUNTER — Ambulatory Visit (INDEPENDENT_AMBULATORY_CARE_PROVIDER_SITE_OTHER): Payer: Medicare Other | Admitting: Internal Medicine

## 2017-07-31 ENCOUNTER — Encounter: Payer: Medicare Other | Admitting: Internal Medicine

## 2017-07-31 ENCOUNTER — Other Ambulatory Visit: Payer: Medicare Other | Admitting: *Deleted

## 2017-07-31 DIAGNOSIS — T827XXD Infection and inflammatory reaction due to other cardiac and vascular devices, implants and grafts, subsequent encounter: Secondary | ICD-10-CM | POA: Diagnosis not present

## 2017-07-31 DIAGNOSIS — I441 Atrioventricular block, second degree: Secondary | ICD-10-CM

## 2017-07-31 DIAGNOSIS — E875 Hyperkalemia: Secondary | ICD-10-CM

## 2017-07-31 DIAGNOSIS — Z950 Presence of cardiac pacemaker: Secondary | ICD-10-CM

## 2017-07-31 DIAGNOSIS — Z79899 Other long term (current) drug therapy: Secondary | ICD-10-CM | POA: Diagnosis not present

## 2017-07-31 LAB — CBC WITH DIFFERENTIAL/PLATELET
BASOS ABS: 0 10*3/uL (ref 0.0–0.2)
Basos: 1 %
EOS (ABSOLUTE): 0.3 10*3/uL (ref 0.0–0.4)
Eos: 4 %
Hematocrit: 32.6 % — ABNORMAL LOW (ref 37.5–51.0)
Hemoglobin: 11.1 g/dL — ABNORMAL LOW (ref 13.0–17.7)
Immature Grans (Abs): 0 10*3/uL (ref 0.0–0.1)
Immature Granulocytes: 0 %
LYMPHS ABS: 1.2 10*3/uL (ref 0.7–3.1)
LYMPHS: 15 %
MCH: 33.9 pg — AB (ref 26.6–33.0)
MCHC: 34 g/dL (ref 31.5–35.7)
MCV: 100 fL — ABNORMAL HIGH (ref 79–97)
Monocytes Absolute: 1.2 10*3/uL — ABNORMAL HIGH (ref 0.1–0.9)
Monocytes: 15 %
NEUTROS ABS: 5 10*3/uL (ref 1.4–7.0)
Neutrophils: 65 %
Platelets: 221 10*3/uL (ref 150–379)
RBC: 3.27 x10E6/uL — ABNORMAL LOW (ref 4.14–5.80)
RDW: 14.4 % (ref 12.3–15.4)
WBC: 7.7 10*3/uL (ref 3.4–10.8)

## 2017-07-31 LAB — BASIC METABOLIC PANEL
BUN / CREAT RATIO: 20 (ref 10–24)
BUN: 24 mg/dL (ref 8–27)
CALCIUM: 9.5 mg/dL (ref 8.6–10.2)
CHLORIDE: 98 mmol/L (ref 96–106)
CO2: 25 mmol/L (ref 20–29)
Creatinine, Ser: 1.18 mg/dL (ref 0.76–1.27)
GFR calc non Af Amer: 63 mL/min/{1.73_m2} (ref >59)
GFR, EST AFRICAN AMERICAN: 72 mL/min/{1.73_m2} (ref >59)
Glucose: 92 mg/dL (ref 65–99)
POTASSIUM: 4.1 mmol/L (ref 3.5–5.2)
Sodium: 137 mmol/L (ref 134–144)

## 2017-07-31 LAB — PROTIME-INR
INR: 2.4 — AB (ref 0.8–1.2)
Prothrombin Time: 23.7 s — ABNORMAL HIGH (ref 9.1–12.0)

## 2017-07-31 NOTE — Progress Notes (Signed)
Here for wound check Removed bandage There was no wound swelling but considerable eccyhmosis  Marked the border and will recheck on Thursday. We discussed rebandaging but decided to watch

## 2017-08-02 ENCOUNTER — Encounter: Payer: Self-pay | Admitting: Internal Medicine

## 2017-08-02 ENCOUNTER — Ambulatory Visit (INDEPENDENT_AMBULATORY_CARE_PROVIDER_SITE_OTHER): Payer: Self-pay | Admitting: *Deleted

## 2017-08-02 DIAGNOSIS — T827XXD Infection and inflammatory reaction due to other cardiac and vascular devices, implants and grafts, subsequent encounter: Secondary | ICD-10-CM

## 2017-08-02 DIAGNOSIS — I441 Atrioventricular block, second degree: Secondary | ICD-10-CM

## 2017-08-02 DIAGNOSIS — Z950 Presence of cardiac pacemaker: Secondary | ICD-10-CM

## 2017-08-02 NOTE — Progress Notes (Signed)
Wound recheck in clinic. L chest explant site covered with gauze/Medipore tape dressing, packing and dressing changed this AM per wife. R chest implant site covered with gauze/Tegaderm occlusive dressing. Dr. [name] assessed sites--ecchymosis improving. Dr. [name] advised patient and wife that explant site may continue to require packing for another month. No changes in plan recommended today. Patient and wife verbalize understanding. They are aware to call our office with signs/symptoms of infection. Wound check (with device check) on 08/06/17.

## 2017-08-03 ENCOUNTER — Encounter (HOSPITAL_COMMUNITY): Admission: RE | Payer: Self-pay | Source: Ambulatory Visit

## 2017-08-03 ENCOUNTER — Ambulatory Visit (HOSPITAL_COMMUNITY): Admission: RE | Admit: 2017-08-03 | Payer: Medicare Other | Source: Ambulatory Visit | Admitting: Internal Medicine

## 2017-08-03 SURGERY — BIV ICD INSERTION CRT-D

## 2017-08-06 ENCOUNTER — Ambulatory Visit (INDEPENDENT_AMBULATORY_CARE_PROVIDER_SITE_OTHER): Payer: Medicare Other | Admitting: Pharmacist

## 2017-08-06 ENCOUNTER — Encounter: Payer: Self-pay | Admitting: Internal Medicine

## 2017-08-06 ENCOUNTER — Ambulatory Visit: Payer: Medicare Other | Admitting: *Deleted

## 2017-08-06 DIAGNOSIS — G459 Transient cerebral ischemic attack, unspecified: Secondary | ICD-10-CM

## 2017-08-06 DIAGNOSIS — Z9889 Other specified postprocedural states: Secondary | ICD-10-CM | POA: Diagnosis not present

## 2017-08-06 DIAGNOSIS — I4891 Unspecified atrial fibrillation: Secondary | ICD-10-CM | POA: Diagnosis not present

## 2017-08-06 DIAGNOSIS — Z5181 Encounter for therapeutic drug level monitoring: Secondary | ICD-10-CM | POA: Diagnosis not present

## 2017-08-06 DIAGNOSIS — I442 Atrioventricular block, complete: Secondary | ICD-10-CM

## 2017-08-06 LAB — CUP PACEART INCLINIC DEVICE CHECK
Brady Statistic RV Percent Paced: 99.25 %
Date Time Interrogation Session: 20190429172221
Implantable Lead Implant Date: 20190419
Implantable Lead Implant Date: 20190419
Implantable Lead Location: 753858
Implantable Lead Location: 753859
Implantable Lead Location: 753860
Implantable Lead Model: 5076
Lead Channel Impedance Value: 400 Ohm
Lead Channel Impedance Value: 437.5 Ohm
Lead Channel Pacing Threshold Amplitude: 0.5 V
Lead Channel Pacing Threshold Amplitude: 0.75 V
Lead Channel Pacing Threshold Amplitude: 0.75 V
Lead Channel Pacing Threshold Amplitude: 1.25 V
Lead Channel Pacing Threshold Pulse Width: 0.5 ms
Lead Channel Pacing Threshold Pulse Width: 0.5 ms
Lead Channel Pacing Threshold Pulse Width: 0.5 ms
Lead Channel Pacing Threshold Pulse Width: 1 ms
Lead Channel Sensing Intrinsic Amplitude: 0.5 mV
Lead Channel Sensing Intrinsic Amplitude: 12 mV
Lead Channel Setting Pacing Amplitude: 3.5 V
Lead Channel Setting Pacing Amplitude: 3.5 V
Lead Channel Setting Pacing Pulse Width: 1 ms
Lead Channel Setting Sensing Sensitivity: 2 mV
MDC IDC LEAD IMPLANT DT: 20190419
MDC IDC MSMT BATTERY REMAINING LONGEVITY: 40 mo
MDC IDC MSMT BATTERY VOLTAGE: 3.01 V
MDC IDC MSMT LEADCHNL RA PACING THRESHOLD PULSEWIDTH: 0.5 ms
MDC IDC MSMT LEADCHNL RV IMPEDANCE VALUE: 437.5 Ohm
MDC IDC MSMT LEADCHNL RV PACING THRESHOLD AMPLITUDE: 0.5 V
MDC IDC PG IMPLANT DT: 20190419
MDC IDC PG SERIAL: 9008098
MDC IDC SET LEADCHNL LV PACING AMPLITUDE: 2.25 V
MDC IDC SET LEADCHNL RV PACING PULSEWIDTH: 0.5 ms
MDC IDC STAT BRADY RA PERCENT PACED: 96 %

## 2017-08-06 LAB — POCT INR: INR: 2.9

## 2017-08-06 NOTE — Progress Notes (Signed)
Wound check appointment. Steri-strips removed from right side. Wound without redness or edema. Incision edges approximated, wound well healed. SK assessed. Normal device function. Thresholds, sensing, and impedances consistent with implant measurements. Device programmed at 3.5V and auto capture programmed on for extra safety margin until 3 month visit. Histogram distribution appropriate for patient and level of activity. 12 mode switches (<1%) - EGMs show atach. No high ventricular rates noted. Patient educated about wound care, arm mobility, lifting restrictions. ROV with DC 5/16 per SK to assess left sided wound from explant

## 2017-08-06 NOTE — Patient Instructions (Signed)
Description   Continue taking 10mg  daily except 15mg  on Sundays and Wednesdays. Recheck INR in 4 weeks. Call with any concerns any new medications or if scheduled for any procedures  [phone]

## 2017-08-16 ENCOUNTER — Ambulatory Visit (INDEPENDENT_AMBULATORY_CARE_PROVIDER_SITE_OTHER): Payer: Self-pay | Admitting: *Deleted

## 2017-08-16 ENCOUNTER — Encounter: Payer: Self-pay | Admitting: Internal Medicine

## 2017-08-16 DIAGNOSIS — I442 Atrioventricular block, complete: Secondary | ICD-10-CM

## 2017-08-16 MED ORDER — CEPHALEXIN 500 MG PO CAPS: 500.0000 mg | ORAL_CAPSULE | Freq: Three times a day (TID) | ORAL | 0 refills | Status: DC

## 2017-08-16 NOTE — Patient Instructions (Signed)
Your physician has recommended you make the following change in your medication:  TAKE KEFLEX 500MG  THREE TIMES DAILY FOR 7DAYS.  Your physician recommends that you schedule a follow-up appointment on 08/23/17 @ 4:30pm.  Call for any signs/symptoms of infection.

## 2017-08-16 NOTE — Telephone Encounter (Signed)
Called patient regarding the drainage from his wound.  Appt scheduled for 5/919 @ 1500 to reassess wound in clinic. Patient verbalized understanding.

## 2017-08-17 NOTE — Progress Notes (Signed)
Patient presents to the office for a wound check d/t drainage from his new implant site (R chest). Incision edges approximated with scab at R lateral edge of incision. Some redness noted around incision, but no edema or drainage present. Dr.Klein assessed wound and explored beneath the scab, nothing found. Dr.Klein recommended applying bacitracin ointment and bandage to the wound QHS. Keflex 500TID x 7days was also Rx'd. Patient verbalized understanding.  Patient also had questions about his explant site. Dr.Klein removed the dressing and iodoform gauze that was used to pack the site. Dr.Klein used some more iodoform gauze to repack the wound. Area was then redressed with gauze and tegaderm. Dr.Klein explained the purpose of using the iodoform to heal the wound. Dr.Klein explained to patient and wife that when the cavity is closed they won't be able to fit anymore iodoform inside. Patient and wife verbalized understanding.  Iodoform bottle and suture removal kit given to patient/wife.  Dr.Klein instructed patient to keep his scheduled appt with the device clinic. Patient was also instructed to call for any signs/sx's of infection. Patient and wife verbalized understanding.

## 2017-08-20 ENCOUNTER — Ambulatory Visit: Payer: Medicare Other

## 2017-08-23 ENCOUNTER — Ambulatory Visit (INDEPENDENT_AMBULATORY_CARE_PROVIDER_SITE_OTHER): Payer: Self-pay | Admitting: *Deleted

## 2017-08-23 DIAGNOSIS — I442 Atrioventricular block, complete: Secondary | ICD-10-CM

## 2017-08-24 NOTE — Progress Notes (Signed)
   Left side wound explant per SK pt's wife to keep packing wound.    Right side new implant    Right side new implant. ROV  08/30/17 for wound re-check.

## 2017-08-28 ENCOUNTER — Encounter: Payer: Medicare Other | Admitting: Internal Medicine

## 2017-08-30 ENCOUNTER — Ambulatory Visit (INDEPENDENT_AMBULATORY_CARE_PROVIDER_SITE_OTHER): Payer: Self-pay | Admitting: *Deleted

## 2017-08-30 DIAGNOSIS — T827XXD Infection and inflammatory reaction due to other cardiac and vascular devices, implants and grafts, subsequent encounter: Secondary | ICD-10-CM

## 2017-08-30 NOTE — Progress Notes (Signed)
Wound recheck in clinic.  Patient's wife continues to pack L chest wound with iodoform gauze as previously instructed by Dr. [name].  Scant brownish drainage noted on R chest gauze dressing.  Dr. [name] assessed sites, removed scab over R lateral corner of incision and able to remove stitch from this site.  Antibiotic ointment and gauze reapplied.  Patient's wife will continue dressing wounds and packing L chest wound.  Wound recheck on 09/06/17 while Dr. [name] is in the office.  Patient aware to call if any signs/symptoms of infection are noted.  Patient gave verbal permission to include the following photos:  R chest incision:   L chest wound:

## 2017-09-06 ENCOUNTER — Ambulatory Visit (INDEPENDENT_AMBULATORY_CARE_PROVIDER_SITE_OTHER): Payer: Medicare Other

## 2017-09-06 ENCOUNTER — Ambulatory Visit: Payer: Medicare Other | Admitting: *Deleted

## 2017-09-06 DIAGNOSIS — G459 Transient cerebral ischemic attack, unspecified: Secondary | ICD-10-CM

## 2017-09-06 DIAGNOSIS — I442 Atrioventricular block, complete: Secondary | ICD-10-CM

## 2017-09-06 DIAGNOSIS — I4891 Unspecified atrial fibrillation: Secondary | ICD-10-CM

## 2017-09-06 DIAGNOSIS — Z9889 Other specified postprocedural states: Secondary | ICD-10-CM

## 2017-09-06 DIAGNOSIS — Z5181 Encounter for therapeutic drug level monitoring: Secondary | ICD-10-CM

## 2017-09-06 LAB — POCT INR: INR: 3.1 — AB (ref 2.0–3.0)

## 2017-09-06 NOTE — Progress Notes (Signed)
Wound re-check in clinic. Patient reports that wife continues to pack left sided incision with iodoform every other day. Right sided incision dressing removed with scant brown drainage present. SK assess and expressed a moderate amount of brown drainage. Incision packed with iodoform. Patient instructed to apply warm compresses twice daily and pack every other day with iodoform. Patient verbalized understanding. ROV in office 6/7 at 830 with SK in office after coumadin appt.

## 2017-09-06 NOTE — Patient Instructions (Signed)
Description   Continue taking 10mg  daily except 15mg  on Sundays and Wednesdays. Recheck INR on Friday 67/19 prior to pt going out of town 6/10-7/20 will plan to recheck in 6 weeks from that date on 10/29/17.  Call with any concerns any new medications or if scheduled for any procedures  [phone]

## 2017-09-14 ENCOUNTER — Ambulatory Visit (INDEPENDENT_AMBULATORY_CARE_PROVIDER_SITE_OTHER): Payer: Self-pay | Admitting: *Deleted

## 2017-09-14 ENCOUNTER — Ambulatory Visit (INDEPENDENT_AMBULATORY_CARE_PROVIDER_SITE_OTHER): Payer: Medicare Other | Admitting: Pharmacist

## 2017-09-14 DIAGNOSIS — Z9889 Other specified postprocedural states: Secondary | ICD-10-CM | POA: Diagnosis not present

## 2017-09-14 DIAGNOSIS — T827XXD Infection and inflammatory reaction due to other cardiac and vascular devices, implants and grafts, subsequent encounter: Secondary | ICD-10-CM

## 2017-09-14 DIAGNOSIS — G459 Transient cerebral ischemic attack, unspecified: Secondary | ICD-10-CM

## 2017-09-14 DIAGNOSIS — Z5181 Encounter for therapeutic drug level monitoring: Secondary | ICD-10-CM | POA: Diagnosis not present

## 2017-09-14 DIAGNOSIS — I4891 Unspecified atrial fibrillation: Secondary | ICD-10-CM

## 2017-09-14 LAB — POCT INR: INR: 3.5 — AB (ref 2.0–3.0)

## 2017-09-14 NOTE — Progress Notes (Signed)
Wound recheck in clinic.  Patient reports that wife continues to pack left chest wound with iodoform every other day and states that they do dressing changes and wick changes on the right chest wound daily.  Right-sided incision dressing and wick removed, scant brown drainage noted on gauze.  Moderate amount of brown serous drainage expressed when re-packing with ~1 inch of iodoform per Dr. Olin Pia instruction.  Patient and wife report that this has happened with home dressing changes as well, necessitating daily dressing changes.  No erythema noted.  Patient denies fever/chills.    Dr. [name] assessed sites, recommended that patient keep performing dressing changes daily on right chest incision.  Plan to reassess on Tuesday, 09/18/17, while Dr. [name] and Dr. Lovena Le are both in the office to discuss next steps.  Patient and wife are aware to call in the interim with questions or concerns, or signs/symptoms of infection.

## 2017-09-14 NOTE — Patient Instructions (Signed)
Description   Continue taking 10mg  daily except 15mg  on Sundays and Wednesdays. Recheck INR on Friday in 6 weeks on 10/30/17 with Dr. [name].  Call with any concerns any new medications or if scheduled for any procedures  [phone]

## 2017-09-18 ENCOUNTER — Ambulatory Visit (INDEPENDENT_AMBULATORY_CARE_PROVIDER_SITE_OTHER): Payer: Self-pay | Admitting: *Deleted

## 2017-09-18 ENCOUNTER — Other Ambulatory Visit: Payer: Self-pay

## 2017-09-18 DIAGNOSIS — I4891 Unspecified atrial fibrillation: Secondary | ICD-10-CM | POA: Diagnosis not present

## 2017-09-18 DIAGNOSIS — T827XXD Infection and inflammatory reaction due to other cardiac and vascular devices, implants and grafts, subsequent encounter: Secondary | ICD-10-CM | POA: Diagnosis not present

## 2017-09-18 DIAGNOSIS — I421 Obstructive hypertrophic cardiomyopathy: Secondary | ICD-10-CM | POA: Diagnosis not present

## 2017-09-18 DIAGNOSIS — R7309 Other abnormal glucose: Secondary | ICD-10-CM

## 2017-09-18 NOTE — Patient Instructions (Signed)
Wash both incision sites twice daily with Dial anti-bacterial soap and clean wash cloth.  Change bed sheets at least once a week Cover incision site with dry dressing and loose fitting clothes.   Seek Medical Care if fever, chills or bleeding from incision site.

## 2017-09-18 NOTE — Addendum Note (Signed)
Addended by: Wanda Plump on: 09/18/2017 03:39 PM   Modules accepted: Orders, Level of Service

## 2017-09-18 NOTE — Progress Notes (Signed)
Wound re-check, left and right incision sites both packed with iodoform and covered with bandage, bandage removed from right incision site and packing removed from opening on right lateral wall incision site, appeared to be pale yellow drainage on iodoform that was removed. Left incision site iodoform packing removed sanguinous drainage noted. Site assessed by Dr. [name] and Dr. Lovena Le. Dr. Lovena Le recommendations to pt is to wash both incisions with dial anti-bacterial soap and a clean wash cloth at last twice a day and cover with dry gauze. Also pt to change bed sheets at least once a week and to wear loose fitting shirts. Pt to f/u with SK on 10/30/2017, pt to call device clinic if incision sites look worse upon return from trip on 10/19/2017.

## 2017-09-19 LAB — HEMOGLOBIN A1C
Est. average glucose Bld gHb Est-mCnc: 117 mg/dL
Hgb A1c MFr Bld: 5.7 % — ABNORMAL HIGH (ref 4.8–5.6)

## 2017-10-15 ENCOUNTER — Encounter: Payer: Self-pay | Admitting: Internal Medicine

## 2017-10-30 ENCOUNTER — Ambulatory Visit (INDEPENDENT_AMBULATORY_CARE_PROVIDER_SITE_OTHER): Payer: Medicare Other | Admitting: *Deleted

## 2017-10-30 ENCOUNTER — Ambulatory Visit (INDEPENDENT_AMBULATORY_CARE_PROVIDER_SITE_OTHER): Payer: Medicare Other | Admitting: Internal Medicine

## 2017-10-30 ENCOUNTER — Encounter: Payer: Self-pay | Admitting: Internal Medicine

## 2017-10-30 VITALS — BP 142/64 | HR 64 | Ht 71.0 in | Wt 337.4 lb

## 2017-10-30 DIAGNOSIS — I4891 Unspecified atrial fibrillation: Secondary | ICD-10-CM

## 2017-10-30 DIAGNOSIS — Z950 Presence of cardiac pacemaker: Secondary | ICD-10-CM | POA: Diagnosis not present

## 2017-10-30 DIAGNOSIS — Z9889 Other specified postprocedural states: Secondary | ICD-10-CM

## 2017-10-30 DIAGNOSIS — I5032 Chronic diastolic (congestive) heart failure: Secondary | ICD-10-CM

## 2017-10-30 DIAGNOSIS — G459 Transient cerebral ischemic attack, unspecified: Secondary | ICD-10-CM

## 2017-10-30 DIAGNOSIS — Z5181 Encounter for therapeutic drug level monitoring: Secondary | ICD-10-CM

## 2017-10-30 DIAGNOSIS — I442 Atrioventricular block, complete: Secondary | ICD-10-CM

## 2017-10-30 LAB — CUP PACEART INCLINIC DEVICE CHECK
Battery Voltage: 2.96 V
Brady Statistic RA Percent Paced: 98 %
Brady Statistic RV Percent Paced: 99.32 %
Implantable Lead Implant Date: 20190419
Implantable Lead Implant Date: 20190419
Implantable Lead Location: 753858
Implantable Lead Model: 5076
Implantable Lead Model: 5076
Lead Channel Impedance Value: 412.5 Ohm
Lead Channel Impedance Value: 437.5 Ohm
Lead Channel Pacing Threshold Amplitude: 0.75 V
Lead Channel Pacing Threshold Pulse Width: 1 ms
Lead Channel Sensing Intrinsic Amplitude: 0.4 mV
Lead Channel Sensing Intrinsic Amplitude: 12 mV
Lead Channel Setting Pacing Amplitude: 2 V
Lead Channel Setting Pacing Pulse Width: 0.5 ms
Lead Channel Setting Sensing Sensitivity: 2 mV
MDC IDC LEAD IMPLANT DT: 20190419
MDC IDC LEAD LOCATION: 753859
MDC IDC LEAD LOCATION: 753860
MDC IDC MSMT BATTERY REMAINING LONGEVITY: 66 mo
MDC IDC MSMT LEADCHNL LV IMPEDANCE VALUE: 400 Ohm
MDC IDC MSMT LEADCHNL LV PACING THRESHOLD AMPLITUDE: 0.75 V
MDC IDC MSMT LEADCHNL RA PACING THRESHOLD PULSEWIDTH: 0.5 ms
MDC IDC MSMT LEADCHNL RV PACING THRESHOLD AMPLITUDE: 1 V
MDC IDC MSMT LEADCHNL RV PACING THRESHOLD PULSEWIDTH: 0.5 ms
MDC IDC PG IMPLANT DT: 20190419
MDC IDC SESS DTM: 20190723172551
MDC IDC SET LEADCHNL LV PACING AMPLITUDE: 2 V
MDC IDC SET LEADCHNL LV PACING PULSEWIDTH: 1 ms
MDC IDC SET LEADCHNL RV PACING AMPLITUDE: 2.5 V
Pulse Gen Serial Number: 9008098

## 2017-10-30 LAB — POCT INR: INR: 2.6 (ref 2.0–3.0)

## 2017-10-30 NOTE — Patient Instructions (Signed)
Description   Continue taking 10mg  daily except 15mg  on Sundays and Wednesdays. Recheck INR in 6 weeks.  Call with any concerns any new medications or if scheduled for any procedures  [phone]

## 2017-10-30 NOTE — Patient Instructions (Signed)
Medication Instructions:  Your physician recommends that you continue on your current medications as directed. Please refer to the Current Medication list given to you today.  Labwork: None ordered.  Testing/Procedures: None ordered.  Follow-Up: Your physician wants you to follow-up in: 6 months with Dr [name]. You will receive a reminder letter in the mail two months in advance. If you don't receive a letter, please call our office to schedule the follow-up appointment.  Remote monitoring is used to monitor your Pacemaker from home. This monitoring reduces the number of office visits required to check your device to one time per year. It allows [name] to keep an eye on the functioning of your device to ensure it is working properly. You are scheduled for a device check from home on 01/29/2018. You may send your transmission at any time that day. If you have a wireless device, the transmission will be sent automatically. After your physician reviews your transmission, you will receive a postcard with your next transmission date.    Any Other Special Instructions Will Be Listed Below (If Applicable).     If you need a refill on your cardiac medications before your next appointment, please call your pharmacy.

## 2017-10-30 NOTE — Progress Notes (Signed)
Patient Care Team: [name], MD as PCP - General (Family Medicine) [name], MD as Consulting Physician (Cardiology)   HPI  AMAAR [name] is a 72 y.o. male Seen in followup for hypertrophic cardiomyopathy,  status post myectomy with mechanical mitral valve replacement and aortic valve repair. 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 His device reached ERI.  With noise on his RV lead, he underwent CRT upgrade.  This was complicated by hematoma some erythema.  It became infected and required explantation.  He had a temporary permanent pacemaker implanted.  This dislodged and he underwent acute CRT implantation on the right side.  He then had problems with poor healing.  4 weeks, we were packing.  Dr. Lovena Le weighed in and started with exuberant washing.  Both wounds healed over the subsequent couple of weeks.  He is feeling good.  He has minimal shortness of breath.  His edema is chronic.  DATE TEST    4/17    echo   EF 55-60 % Severe LVH (18/17). BAE  PA press 62 RV normal  10/18 Echo  EF 45-50% Some segmental WMA Severe RVE, depressed EF LAE (63/2.1/33)     CPX test done about 2010 demonstrated a VO2 max of 12.2 representing 66.4% predicted. his VO2 adjusted for body weight was 25.9 His slope was 34 and his O2 pulse was 17, 88% of predicted.   Date Cr K Hgb  10/18 1.02  12.4   4/19  1.18 4.1 11.1     Past Medical History:  Diagnosis Date  . Atrial arrhythmia    status post ablation in [place] with complication including damage to the aortic valve  . Atrial fibrillation ([place])   . AV block, 1st degree   . Bradycardia   . Cluster headache ~ 1980   "went away after I started taking one of the heart RXs" (06/29/2017)  . Diastolic CHF, chronic ([place])   . Diverticulosis   . History of gout   . HTN (hypertension)   . Hypertrophic cardiomyopathy ([place])    s/p myoectomy  . Infection   . Internal hemorrhoids   .  Morbid obesity ([place])   . Morbid obesity with BMI of 45.0-49.9, adult ([place])   . Pacemaker  -SJM   . Skin cancer    "burned off RUE X 1; cut off nose X 2" (06/29/2017)  . TIA (transient ischemic attack) 2001  . Tubulovillous adenoma     Past Surgical History:  Procedure Laterality Date  . 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 BIV PACEMAKER INSERTION CRT-P N/A 07/27/2017   Procedure: BIV PACEMAKER INSERTION CRT-P;  Surgeon: [name], MD;  Location: [facility];  Service: Cardiovascular;  Laterality: N/A;  . BIV UPGRADE N/A 05/28/2017   Procedure: BIV [place];  Surgeon: [name], MD;  Location: [facility];  Service: Cardiovascular;  Laterality: N/A;  . CARDIAC CATHETERIZATION     "couple times" (06/29/2017)  . CARDIAC VALVE REPLACEMENT    . COLONOSCOPY    . COLONOSCOPY WITH PROPOFOL N/A 04/13/2016   Procedure: COLONOSCOPY WITH PROPOFOL;  Surgeon: Gatha Mayer, MD;  Location: [facility];  Service: Endoscopy;  Laterality: N/A;  . INSERT / REPLACE / REMOVE PACEMAKER  ~ [address] Jude Accent   . 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  .  PACEMAKER LEAD REMOVAL N/A 06/29/2017   Procedure: [id number]  REMOVAL AND Four LEAD REMOVAL;  Surgeon: Evans Lance, MD;  Location: [facility];  Service: Cardiovascular;  Laterality: N/A;  . PACEMAKER REMOVAL  06/29/2017   GERNERATOR  REMOVAL AND Four LEAD REMOVAL; put in external pacemaker (06/29/2017)  . septal myectomy  07/22/1997  . SKIN CANCER EXCISION      Current Outpatient Medications  Medication Sig Dispense Refill  . acetaminophen (TYLENOL) 325 MG tablet Take 2 tablets (650 mg total) by mouth every 6 (six) hours as needed for mild pain.    . Ascorbic Acid (VITAMIN C) 1000 MG tablet Take 2,000 mg by mouth daily.      Marland Kitchen aspirin 81 MG tablet Take 81 mg by mouth daily.    . cephALEXin (KEFLEX) 500 MG capsule Take 1 capsule (500 mg total)  by mouth 3 (three) times daily. 21 capsule 0  . COUMADIN 10 MG tablet TAKE AS DIRECTED BY COUMADIN CLINIC 120 tablet 1  . diphenhydramine-acetaminophen (TYLENOL PM) 25-500 MG TABS tablet Take 2 tablets by mouth at bedtime as needed (for sleep).    . fish oil-omega-3 fatty acids 1000 MG capsule Take 4 g by mouth every morning.     Marland Kitchen losartan (COZAAR) 100 MG tablet Take 1 tablet (100 mg total) by mouth daily. 90 tablet 3  . Magnesium Oxide 250 MG TABS Take 1 tablet (250 mg total) by mouth 3 (three) times daily. 270 tablet 3  . Multiple Vitamin (MULTIVITAMIN) tablet Take 1 tablet by mouth daily.      . potassium chloride (K-DUR,KLOR-CON) 10 MEQ tablet Take 9 tablets (90 meq) by mouth once daily    . torsemide (DEMADEX) 20 MG tablet Take 2 tablets (40 mg total) by mouth 2 (two) times daily. 360 tablet 3  . spironolactone (ALDACTONE) 25 MG tablet Take 1 tablet (25 mg total) by mouth daily. 30 tablet 0   No current facility-administered medications for this visit.     Allergies  Allergen Reactions  . Warfarin And Related Other (See Comments)    Per the patient's wife, "Warfain made him have a TIA" CAN TAKE BRAND NAME COUMADIN   . Lovenox [Enoxaparin Sodium] Other (See Comments)    Causes large lumps at injection site    Review of Systems negative except from HPI and PMH  Physical Exam BP (!) 142/64   Pulse 64   Ht 5\' 11"  (1.803 m)   Wt (!) 337 lb 6.4 oz (153 kg)   SpO2 94%   BMI 47.06 kg/m  Well developed and nourished in no acute distress HENT normal Neck supple with JVP-flat Clear Healed wounds bilaterally Regular rate and rhythm, mechanical S1 and S2 to 2/6 systolic murmur Abd-soft with active BS No Clubbing cyanosis 1+ edema Skin-warm and dry A & Oriented  Grossly normal sensory and motor function  ECG demonstrated AV pacing with an upright QRS lead V1 and negative QRS lead I  Assessment and  Plan  Atrial fibrillation-paroxysmal  Mitral valve  replacement-mechanical  Hypertrophic cardiomyopathy status post myectomy  HFpEF  Complete heart block  VT  Nonsustained   Healed wounds    \ His LV function hopefully has improved following resynchronization.  We will recheck an echo.  Continue current medications; we will recheck potassium on his spironolactone with his hemoglobin having been down a little bit we will recheck his CBC.   We spent more than 50% of our >25 min visit in face to face  counseling regarding the above  

## 2017-11-01 ENCOUNTER — Encounter (HOSPITAL_COMMUNITY): Payer: Self-pay | Admitting: *Deleted

## 2017-11-01 ENCOUNTER — Other Ambulatory Visit: Payer: Self-pay

## 2017-11-01 ENCOUNTER — Other Ambulatory Visit: Payer: Medicare Other | Admitting: *Deleted

## 2017-11-01 ENCOUNTER — Ambulatory Visit (HOSPITAL_COMMUNITY): Payer: Medicare Other | Attending: Cardiology

## 2017-11-01 DIAGNOSIS — I10 Essential (primary) hypertension: Secondary | ICD-10-CM | POA: Diagnosis not present

## 2017-11-01 DIAGNOSIS — I440 Atrioventricular block, first degree: Secondary | ICD-10-CM | POA: Insufficient documentation

## 2017-11-01 DIAGNOSIS — Z952 Presence of prosthetic heart valve: Secondary | ICD-10-CM | POA: Insufficient documentation

## 2017-11-01 DIAGNOSIS — Z87891 Personal history of nicotine dependence: Secondary | ICD-10-CM | POA: Insufficient documentation

## 2017-11-01 DIAGNOSIS — Z6841 Body Mass Index (BMI) 40.0 and over, adult: Secondary | ICD-10-CM | POA: Diagnosis not present

## 2017-11-01 DIAGNOSIS — I272 Pulmonary hypertension, unspecified: Secondary | ICD-10-CM | POA: Insufficient documentation

## 2017-11-01 DIAGNOSIS — I422 Other hypertrophic cardiomyopathy: Secondary | ICD-10-CM | POA: Diagnosis not present

## 2017-11-01 DIAGNOSIS — Z8249 Family history of ischemic heart disease and other diseases of the circulatory system: Secondary | ICD-10-CM | POA: Diagnosis not present

## 2017-11-01 DIAGNOSIS — Z79899 Other long term (current) drug therapy: Secondary | ICD-10-CM

## 2017-11-01 DIAGNOSIS — I442 Atrioventricular block, complete: Secondary | ICD-10-CM | POA: Diagnosis not present

## 2017-11-01 DIAGNOSIS — E875 Hyperkalemia: Secondary | ICD-10-CM | POA: Diagnosis not present

## 2017-11-01 DIAGNOSIS — I4891 Unspecified atrial fibrillation: Secondary | ICD-10-CM | POA: Insufficient documentation

## 2017-11-01 DIAGNOSIS — I350 Nonrheumatic aortic (valve) stenosis: Secondary | ICD-10-CM | POA: Diagnosis not present

## 2017-11-01 DIAGNOSIS — Z950 Presence of cardiac pacemaker: Secondary | ICD-10-CM | POA: Diagnosis not present

## 2017-11-01 DIAGNOSIS — I361 Nonrheumatic tricuspid (valve) insufficiency: Secondary | ICD-10-CM | POA: Diagnosis not present

## 2017-11-01 DIAGNOSIS — G4733 Obstructive sleep apnea (adult) (pediatric): Secondary | ICD-10-CM | POA: Diagnosis not present

## 2017-11-01 LAB — BASIC METABOLIC PANEL
BUN/Creatinine Ratio: 17 (ref 10–24)
BUN: 20 mg/dL (ref 8–27)
CALCIUM: 8.7 mg/dL (ref 8.6–10.2)
CO2: 22 mmol/L (ref 20–29)
CREATININE: 1.18 mg/dL (ref 0.76–1.27)
Chloride: 98 mmol/L (ref 96–106)
GFR calc Af Amer: 72 mL/min/{1.73_m2} (ref >59)
GFR, EST NON AFRICAN AMERICAN: 63 mL/min/{1.73_m2} (ref >59)
Glucose: 100 mg/dL — ABNORMAL HIGH (ref 65–99)
Potassium: 4.5 mmol/L (ref 3.5–5.2)
Sodium: 135 mmol/L (ref 134–144)

## 2017-11-01 NOTE — Progress Notes (Unsigned)
Mr. Uzelac refused use of Definity during todays exam stating "I'm not too keen on an IV today, I need to prepare for that sort of thing.  Ive had lots of bad IV sticks in the last few months."  I explained that he may need to return for follow up with more imaging per his physician, and he is agreeable to this plan if needed.    Deliah Boston, RDCS

## 2017-11-02 ENCOUNTER — Encounter: Payer: Medicare Other | Admitting: Internal Medicine

## 2017-11-05 ENCOUNTER — Other Ambulatory Visit: Payer: Self-pay | Admitting: *Deleted

## 2017-11-05 DIAGNOSIS — G4733 Obstructive sleep apnea (adult) (pediatric): Secondary | ICD-10-CM

## 2017-11-08 ENCOUNTER — Telehealth: Payer: Self-pay | Admitting: Pulmonary Disease

## 2017-11-08 DIAGNOSIS — G4733 Obstructive sleep apnea (adult) (pediatric): Secondary | ICD-10-CM | POA: Diagnosis not present

## 2017-11-08 NOTE — Telephone Encounter (Signed)
Per RA, HST showed severe OSA with 28 events per hour. Even though the study data was limited, RA still suggests a CPAP titration study. Especially with patients prior concerns with a CPAP mask in the past.   Will place order once patient is aware and has agreed to this.

## 2017-11-15 NOTE — Telephone Encounter (Signed)
Spoke with patient. He is aware of results. He is still not interested in starting CPAP therapy unless "the machine can be kept out of the bedroom, does not make noise and a mask is not required".   RA, please advise. Thanks!

## 2017-11-15 NOTE — Telephone Encounter (Signed)
Dr Elsworth Soho - Pt sent e-mail to the office with additional questions/concerns regarding his sleep study results.  The results are not yet scanned into epic.  Please advise, thank you.

## 2017-11-16 NOTE — Telephone Encounter (Signed)
Okay to not pursue this further

## 2017-11-16 NOTE — Telephone Encounter (Signed)
Okay to mail results to the patient. Sleep study report is self-explanatory.  We can discuss during office visit if he wants to discuss further after reviewing the results

## 2017-11-19 NOTE — Telephone Encounter (Signed)
Sleep study printed by [facility] (thank you!) E-mail sent to patient to inquire about the address HST pinned to sick board in triage until we know what address to send it to

## 2017-11-19 NOTE — Telephone Encounter (Signed)
See MyChart encounter from 11/15/17. Will close this encounter.

## 2017-12-11 ENCOUNTER — Ambulatory Visit (INDEPENDENT_AMBULATORY_CARE_PROVIDER_SITE_OTHER): Payer: Medicare Other | Admitting: *Deleted

## 2017-12-11 DIAGNOSIS — Z9889 Other specified postprocedural states: Secondary | ICD-10-CM

## 2017-12-11 DIAGNOSIS — Z5181 Encounter for therapeutic drug level monitoring: Secondary | ICD-10-CM | POA: Diagnosis not present

## 2017-12-11 DIAGNOSIS — G459 Transient cerebral ischemic attack, unspecified: Secondary | ICD-10-CM

## 2017-12-11 DIAGNOSIS — I4891 Unspecified atrial fibrillation: Secondary | ICD-10-CM

## 2017-12-11 LAB — POCT INR: INR: 4.1 — AB (ref 2.0–3.0)

## 2017-12-11 NOTE — Patient Instructions (Signed)
Description   Hold tomorrow's dose then continue taking 10mg  daily except 15mg  on Sundays and Wednesdays. Recheck INR in 10 days before heading [place] to [place].  Call with any concerns any new medications or if scheduled for any procedures  [phone]

## 2017-12-12 DIAGNOSIS — D1801 Hemangioma of skin and subcutaneous tissue: Secondary | ICD-10-CM | POA: Diagnosis not present

## 2017-12-12 DIAGNOSIS — L720 Epidermal cyst: Secondary | ICD-10-CM | POA: Diagnosis not present

## 2017-12-12 DIAGNOSIS — D229 Melanocytic nevi, unspecified: Secondary | ICD-10-CM | POA: Diagnosis not present

## 2017-12-12 DIAGNOSIS — L738 Other specified follicular disorders: Secondary | ICD-10-CM | POA: Diagnosis not present

## 2017-12-12 DIAGNOSIS — L821 Other seborrheic keratosis: Secondary | ICD-10-CM | POA: Diagnosis not present

## 2017-12-12 DIAGNOSIS — L819 Disorder of pigmentation, unspecified: Secondary | ICD-10-CM | POA: Diagnosis not present

## 2017-12-12 DIAGNOSIS — L814 Other melanin hyperpigmentation: Secondary | ICD-10-CM | POA: Diagnosis not present

## 2017-12-14 ENCOUNTER — Ambulatory Visit: Payer: Medicare Other | Admitting: Adult Health

## 2017-12-17 ENCOUNTER — Encounter: Payer: Self-pay | Admitting: Adult Health

## 2017-12-17 ENCOUNTER — Ambulatory Visit (INDEPENDENT_AMBULATORY_CARE_PROVIDER_SITE_OTHER): Payer: Medicare Other | Admitting: Adult Health

## 2017-12-17 DIAGNOSIS — I272 Pulmonary hypertension, unspecified: Secondary | ICD-10-CM | POA: Diagnosis not present

## 2017-12-17 DIAGNOSIS — G4733 Obstructive sleep apnea (adult) (pediatric): Secondary | ICD-10-CM

## 2017-12-17 NOTE — Assessment & Plan Note (Signed)
Wt loss  

## 2017-12-17 NOTE — Progress Notes (Signed)
@Patient  ID: Frank [name], male    DOB: 1945/08/21, 72 y.o.   MRN: [id number]  Chief Complaint  Patient presents with  . Follow-up    OSA    Referring provider: [name], MD  HPI: 72 year old male seen for sleep consult March 2019 for hypoxia for a home sleep study that showed moderate to severe sleep apnea. PMH : CHF , PPM hypertrophic cardiomyopathy, mechanical mitral valve and aortic valve repair, A. fib on Coumadin   TEST  10/2017 HST AHI 27.8/hr , SaO2 low 81%.  Echo 01/2017 EF 45-50%, decreased RV systolic function with RVSP 22 2D echo November 01, 2017 60 to 65%, moderate LVH, mitral valve mechanical prosthesis, left atrium severely dilated, right ventricle moderately dilated, right atrium severely dilated, pulmonary artery pressure 74 mmHg   12/17/2017 Follow up : OSA  Patient presents for a follow-up for sleep apnea.  Patient was seen earlier this year for a sleep consult for nocturnal hypoxia.  Patient has an extensive cardiac history with congestive heart failure,  A. fib and valvular heart disease.  Patient has multiple risk factors for sleep apnea.  He was set up for a home sleep study that was done in July this year that showed moderate to severe sleep apnea with an AHI at 27.8/hour, SaO2 low at 81%.  (Supine position with AHI 58/hr ).  We reviewed his sleep study results in detail.  Patient education on sleep apnea, potential complications of uncontrolled sleep apnea.  Also discussed pulmonary hypertension and potential cause-and-effect relationship between sleep apnea and pulmonary hypertension.. Discussed nocturnal hypoxemia and potential complications.  Patient denies any daytime sleepiness snoring or restless sleep.. Patient is very reluctant to begin CPAP or go for a CPAP titration study as recommended. Patient feels that he has claustrophobic and will not be able to wear the mask.  We showed him a small nasal mask and discussed mask fitting along with a CPAP  titration study.  However he continues to decline and wants to think about this.  Advised him also to talk to cardiology regarding his results and potential cardiac effects from sleep apnea. Discussed weight loss.      Allergies  Allergen Reactions  . Warfarin And Related Other (See Comments)    Per the patient's wife, "Warfain made him have a TIA" CAN TAKE BRAND NAME COUMADIN   . Lovenox [Enoxaparin Sodium] Other (See Comments)    Causes large lumps at injection site     There is no immunization history on file for this patient.  Past Medical History:  Diagnosis Date  . Atrial arrhythmia    status post ablation in [place] with complication including damage to the aortic valve  . Atrial fibrillation ([place])   . AV block, 1st degree   . Bradycardia   . Cluster headache ~ 1980   "went away after I started taking one of the heart RXs" (06/29/2017)  . Diastolic CHF, chronic ([place])   . Diverticulosis   . History of gout   . HTN (hypertension)   . Hypertrophic cardiomyopathy ([place])    s/p myoectomy  . Infection   . Internal hemorrhoids   . Morbid obesity ([place])   . Morbid obesity with BMI of 45.0-49.9, adult ([place])   . Pacemaker  -SJM   . Skin cancer    "burned off RUE X 1; cut off nose X 2" (06/29/2017)  . TIA (transient ischemic attack) 2001  . Tubulovillous adenoma     Tobacco History:  Social History   Tobacco Use  Smoking Status Former Smoker  . Years: 10.00  . Types: Cigarettes, Pipe, Cigars  . Last attempt to quit: 04/10/1977  . Years since quitting: 40.7  Smokeless Tobacco Never Used   Counseling given: Not Answered   Outpatient Medications Prior to Visit  Medication Sig Dispense Refill  . acetaminophen (TYLENOL) 325 MG tablet Take 2 tablets (650 mg total) by mouth every 6 (six) hours as needed for mild pain.    . Ascorbic Acid (VITAMIN C) 1000 MG tablet Take 2,000 mg by mouth daily.      Marland Kitchen aspirin 81 MG tablet Take 81 mg by mouth daily.    Marland Kitchen COUMADIN 10 MG  tablet TAKE AS DIRECTED BY COUMADIN CLINIC 120 tablet 1  . diphenhydramine-acetaminophen (TYLENOL PM) 25-500 MG TABS tablet Take 2 tablets by mouth at bedtime as needed (for sleep).    . fish oil-omega-3 fatty acids 1000 MG capsule Take 4 g by mouth every morning.     Marland Kitchen losartan (COZAAR) 100 MG tablet Take 1 tablet (100 mg total) by mouth daily. 90 tablet 3  . Magnesium Oxide 250 MG TABS Take 1 tablet (250 mg total) by mouth 3 (three) times daily. 270 tablet 3  . Multiple Vitamin (MULTIVITAMIN) tablet Take 1 tablet by mouth daily.      . potassium chloride (K-DUR,KLOR-CON) 10 MEQ tablet Take 9 tablets (90 meq) by mouth once daily    . spironolactone (ALDACTONE) 25 MG tablet Take 1 tablet (25 mg total) by mouth daily. 30 tablet 0  . torsemide (DEMADEX) 20 MG tablet Take 2 tablets (40 mg total) by mouth 2 (two) times daily. 360 tablet 3  . cephALEXin (KEFLEX) 500 MG capsule Take 1 capsule (500 mg total) by mouth 3 (three) times daily. (Patient not taking: Reported on 12/17/2017) 21 capsule 0   No facility-administered medications prior to visit.      Review of Systems  Constitutional:   No  weight loss, night sweats,  Fevers, chills, fatigue, or  lassitude.  HEENT:   No headaches,  Difficulty swallowing,  Tooth/dental problems, or  Sore throat,                No sneezing, itching, ear ache, nasal congestion, post nasal drip,   CV:  No chest pain,  Orthopnea, PND, +chronic swelling in lower extremities,  No anasarca, dizziness, palpitations, syncope.   GI  No heartburn, indigestion, abdominal pain, nausea, vomiting, diarrhea, change in bowel habits, loss of appetite, bloody stools.   Resp: No shortness of breath with exertion or at rest.  No excess mucus, no productive cough,  No non-productive cough,  No coughing up of blood.  No change in color of mucus.  No wheezing.  No chest wall deformity  Skin: no rash or lesions.  GU: no dysuria, change in color of urine, no urgency or frequency.   No flank pain, no hematuria   MS:  No joint pain or swelling.  No decreased range of motion.  No back pain.    Physical Exam  BP 134/70 (BP Location: Left Arm, Cuff Size: Large)   Pulse 60   Wt (!) 341 lb 1.6 oz (154.7 kg)   SpO2 93%   BMI 47.57 kg/m   GEN: A/Ox3; pleasant , NAD, morbid obese    HEENT:  [place]/AT,  THROAT-clear, no lesions, no postnasal drip or exudate noted. Class 3 MP airway   NECK:  Supple w/ fair ROM; no JVD;  normal carotid impulses w/o bruits; no thyromegaly or nodules palpated; no lymphadenopathy.    RESP  Clear  P & A; w/o, wheezes/ rales/ or rhonchi. no accessory muscle use, no dullness to percussion  CARD:  RRR, no m/r/g, 1-2+ peripheral edema, pulses intact, no cyanosis or clubbing.  GI:   Soft & nt; nml bowel sounds;   Musco: Warm bil, no deformities or joint swelling noted.   Neuro: alert, no focal deficits noted.    Skin: Warm, no lesions or rashes    Lab Results:  BMET   BNP  Imaging: No results found.   Assessment & Plan:   OSA (obstructive sleep apnea) Moderate to Severe OSA with underlying CHF, A Fib , Nocturnal Hypoxia and Pulmonary HTN  Highly recommend CPAP for this pt. Would benefit from CPAP titration . Pt education given in detail.  He wants to think about this and get back to our office.  Went over mask alternatives , may be able to tolerate smaller mask with dream wear nasal mask .   Plan  Patient Instructions  Recommend CPAP titration study .  Please call back if you would like to proceed with titration study or begin CPAP At bedtime   Work on healthy weight  Do not drive if sleepy  Follow up with Dr. Elsworth Soho  In 3 months and As needed        MORBID OBESITY Wt loss   Pulmonary hypertension ([place]) Severe pulmonary HTN on recent Echo -(strange that echo last year 2018 showed PAP was okay ) has been recommended to see Dr. Tempie Hoist for evalution .(may need [facility] to verify )  Doubt PE as on coumadin and shows therapeutic  INR on lab review.  Would definitely tx underlying OSA /Nocuturnal hypoxia with CPAP .  HRCT chest in 06/2017 neg for ILD .   Plan  Patient Instructions  Recommend CPAP titration study .  Please call back if you would like to proceed with titration study or begin CPAP At bedtime   Work on healthy weight  Do not drive if sleepy  Follow up with Dr. Elsworth Soho  In 3 months and As needed           Rexene Edison, NP 12/17/2017

## 2017-12-17 NOTE — Patient Instructions (Signed)
Recommend CPAP titration study .  Please call back if you would like to proceed with titration study or begin CPAP At bedtime   Work on healthy weight  Do not drive if sleepy  Follow up with Dr. Elsworth Soho  In 3 months and As needed

## 2017-12-17 NOTE — Assessment & Plan Note (Signed)
Moderate to Severe OSA with underlying CHF, A Fib , Nocturnal Hypoxia and Pulmonary HTN  Highly recommend CPAP for this pt. Would benefit from CPAP titration . Pt education given in detail.  He wants to think about this and get back to our office.  Went over mask alternatives , may be able to tolerate smaller mask with dream wear nasal mask .   Plan  Patient Instructions  Recommend CPAP titration study .  Please call back if you would like to proceed with titration study or begin CPAP At bedtime   Work on healthy weight  Do not drive if sleepy  Follow up with Dr. Elsworth Soho  In 3 months and As needed

## 2017-12-17 NOTE — Assessment & Plan Note (Addendum)
Severe pulmonary HTN on recent Echo -(strange that echo last year 2018 showed PAP was okay ) has been recommended to see Dr. Tempie Hoist for evalution .(may need [facility] to verify )  Doubt PE as on coumadin and shows therapeutic INR on lab review.  Would definitely tx underlying OSA /Nocuturnal hypoxia with CPAP .  HRCT chest in 06/2017 neg for ILD .   Plan  Patient Instructions  Recommend CPAP titration study .  Please call back if you would like to proceed with titration study or begin CPAP At bedtime   Work on healthy weight  Do not drive if sleepy  Follow up with Dr. Elsworth Soho  In 3 months and As needed

## 2017-12-18 NOTE — Telephone Encounter (Signed)
Dr [name] has addressed pt's concerns personally.

## 2017-12-21 ENCOUNTER — Ambulatory Visit (INDEPENDENT_AMBULATORY_CARE_PROVIDER_SITE_OTHER): Payer: Medicare Other

## 2017-12-21 DIAGNOSIS — I4891 Unspecified atrial fibrillation: Secondary | ICD-10-CM | POA: Diagnosis not present

## 2017-12-21 DIAGNOSIS — G459 Transient cerebral ischemic attack, unspecified: Secondary | ICD-10-CM

## 2017-12-21 DIAGNOSIS — Z5181 Encounter for therapeutic drug level monitoring: Secondary | ICD-10-CM

## 2017-12-21 DIAGNOSIS — Z9889 Other specified postprocedural states: Secondary | ICD-10-CM

## 2017-12-21 LAB — POCT INR: INR: 2.9 (ref 2.0–3.0)

## 2017-12-21 NOTE — Patient Instructions (Signed)
Description   Continue on same dosage 10mg  daily except 15mg  on Sundays and Wednesdays. Recheck in 6 weeks.  Call with any concerns any new medications or if scheduled for any procedures  [phone]

## 2018-01-29 ENCOUNTER — Telehealth: Payer: Self-pay | Admitting: Cardiology

## 2018-01-29 ENCOUNTER — Encounter: Payer: Medicare Other | Admitting: *Deleted

## 2018-01-29 NOTE — Telephone Encounter (Signed)
Spoke with pt and reminded pt of remote transmission that is due today. Pt verbalized understanding.   

## 2018-01-30 ENCOUNTER — Encounter: Payer: Self-pay | Admitting: Cardiology

## 2018-02-07 ENCOUNTER — Ambulatory Visit (INDEPENDENT_AMBULATORY_CARE_PROVIDER_SITE_OTHER): Payer: Medicare Other | Admitting: *Deleted

## 2018-02-07 DIAGNOSIS — I442 Atrioventricular block, complete: Secondary | ICD-10-CM | POA: Diagnosis not present

## 2018-02-07 DIAGNOSIS — I428 Other cardiomyopathies: Secondary | ICD-10-CM

## 2018-02-07 NOTE — Progress Notes (Signed)
Remote pacemaker transmission.   

## 2018-02-12 ENCOUNTER — Other Ambulatory Visit: Payer: Self-pay | Admitting: Physician Assistant

## 2018-02-12 ENCOUNTER — Other Ambulatory Visit: Payer: Self-pay | Admitting: Internal Medicine

## 2018-02-13 ENCOUNTER — Ambulatory Visit (INDEPENDENT_AMBULATORY_CARE_PROVIDER_SITE_OTHER): Payer: Medicare Other | Admitting: *Deleted

## 2018-02-13 DIAGNOSIS — I4891 Unspecified atrial fibrillation: Secondary | ICD-10-CM | POA: Diagnosis not present

## 2018-02-13 DIAGNOSIS — G459 Transient cerebral ischemic attack, unspecified: Secondary | ICD-10-CM | POA: Diagnosis not present

## 2018-02-13 DIAGNOSIS — Z5181 Encounter for therapeutic drug level monitoring: Secondary | ICD-10-CM

## 2018-02-13 DIAGNOSIS — Z9889 Other specified postprocedural states: Secondary | ICD-10-CM

## 2018-02-13 LAB — POCT INR: INR: 2.7 (ref 2.0–3.0)

## 2018-02-13 NOTE — Patient Instructions (Signed)
Description   Continue on same dosage 10mg  daily except 15mg  on Sundays and Wednesdays. Recheck in 6 weeks.  Call with any concerns any new medications or if scheduled for any procedures  [phone]

## 2018-02-15 ENCOUNTER — Encounter: Payer: Self-pay | Admitting: Cardiology

## 2018-03-02 IMAGING — DX DG CHEST 2V
2 series · 2 of 2 positions shown · non-contrast
Comparison: 05/05/2009

CLINICAL DATA: Cough, abdominal swelling, shortness of breath for 1
month

EXAM:
CHEST  2 VIEW

[x chest ap]
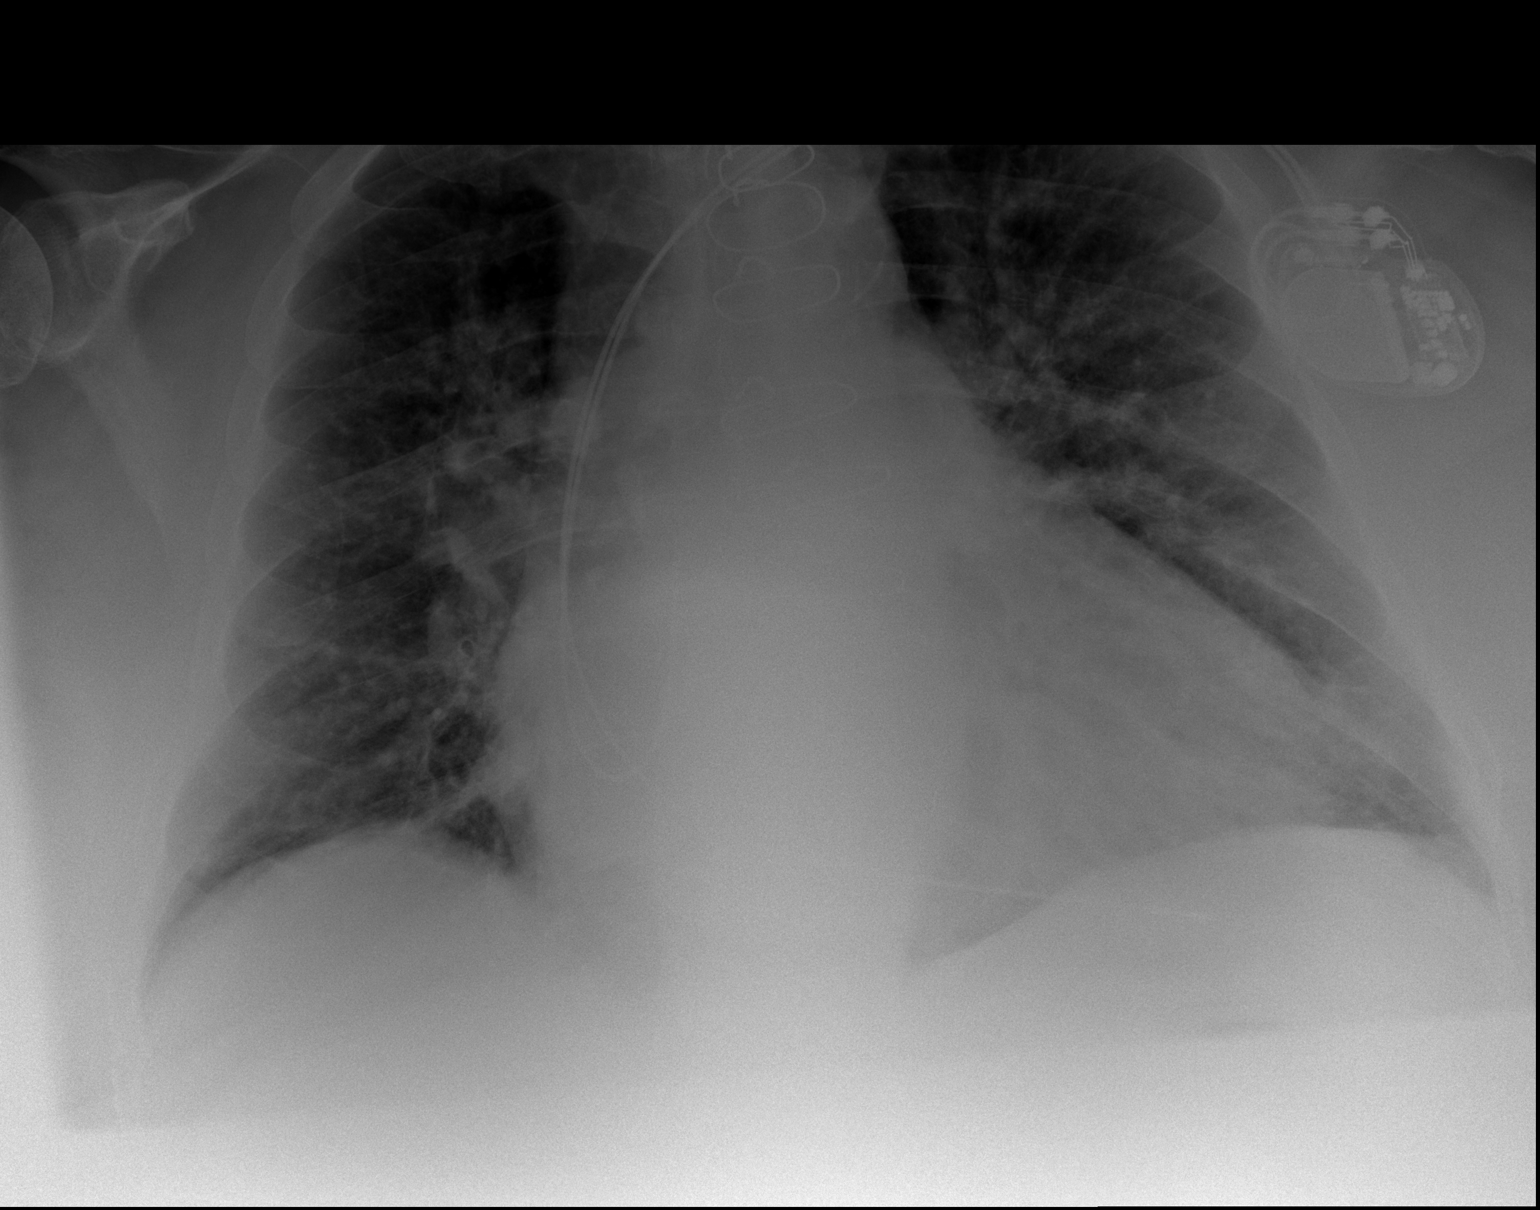

[w chest lat]
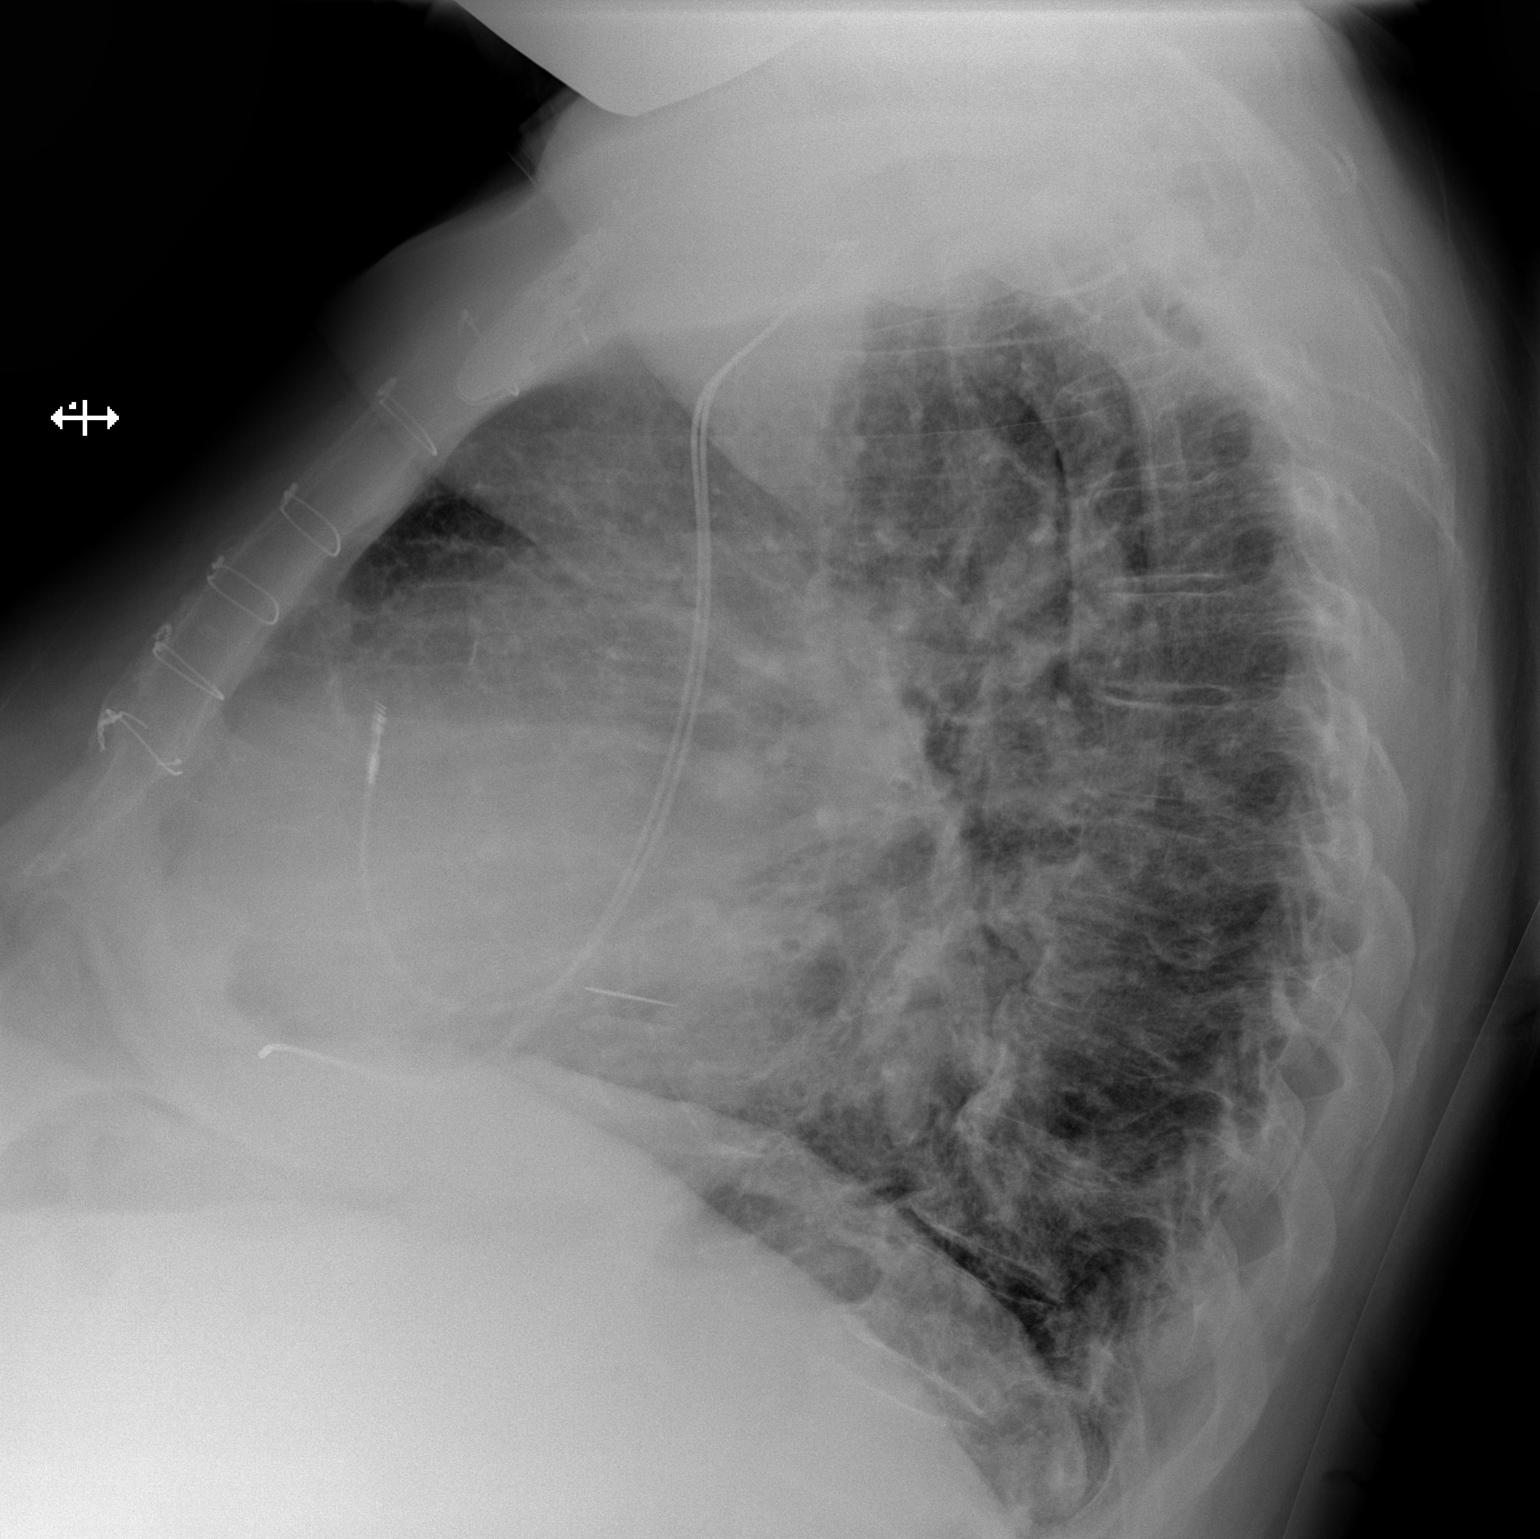

[2 of 2 positions shown; findings below may reference images not displayed]

FINDINGS: Left tool lead pacer remains in place, unchanged. There is
cardiomegaly with vascular congestion and interstitial prominence
throughout the lungs, possibly interstitial edema. No effusions. No
acute bony abnormality.
IMPRESSION: Cardiomegaly with vascular congestion and increasing interstitial
prominence concerning for interstitial edema/CHF.

## 2018-03-25 ENCOUNTER — Ambulatory Visit (INDEPENDENT_AMBULATORY_CARE_PROVIDER_SITE_OTHER): Payer: Medicare Other | Admitting: Pharmacist

## 2018-03-25 DIAGNOSIS — G459 Transient cerebral ischemic attack, unspecified: Secondary | ICD-10-CM | POA: Diagnosis not present

## 2018-03-25 DIAGNOSIS — Z9889 Other specified postprocedural states: Secondary | ICD-10-CM

## 2018-03-25 DIAGNOSIS — Z5181 Encounter for therapeutic drug level monitoring: Secondary | ICD-10-CM

## 2018-03-25 DIAGNOSIS — I4891 Unspecified atrial fibrillation: Secondary | ICD-10-CM

## 2018-03-25 LAB — POCT INR: INR: 3.3 — AB (ref 2.0–3.0)

## 2018-03-25 NOTE — Patient Instructions (Signed)
Description   Continue on same dosage 10mg  daily except 15mg  on Sundays and Wednesdays. Recheck in 6 weeks.  Call with any concerns any new medications or if scheduled for any procedures  [phone]

## 2018-04-09 LAB — CUP PACEART REMOTE DEVICE CHECK
Battery Remaining Percentage: 95.5 %
Battery Voltage: 2.98 V
Brady Statistic AP VP Percent: 99 %
Brady Statistic AP VS Percent: 1 %
Brady Statistic AS VP Percent: 1 %
Brady Statistic RA Percent Paced: 99 %
Implantable Lead Implant Date: 20190419
Implantable Lead Implant Date: 20190419
Implantable Lead Location: 753858
Implantable Lead Location: 753859
Implantable Lead Location: 753860
Implantable Lead Model: 5076
Lead Channel Impedance Value: 400 Ohm
Lead Channel Impedance Value: 410 Ohm
Lead Channel Pacing Threshold Amplitude: 0.75 V
Lead Channel Pacing Threshold Amplitude: 0.75 V
Lead Channel Pacing Threshold Pulse Width: 0.5 ms
Lead Channel Pacing Threshold Pulse Width: 1 ms
Lead Channel Sensing Intrinsic Amplitude: 0.3 mV
Lead Channel Sensing Intrinsic Amplitude: 3.3 mV
Lead Channel Setting Pacing Amplitude: 2 V
Lead Channel Setting Pacing Amplitude: 2.5 V
Lead Channel Setting Pacing Pulse Width: 0.5 ms
Lead Channel Setting Sensing Sensitivity: 2 mV
MDC IDC LEAD IMPLANT DT: 20190419
MDC IDC MSMT BATTERY REMAINING LONGEVITY: 74 mo
MDC IDC MSMT LEADCHNL RA IMPEDANCE VALUE: 390 Ohm
MDC IDC MSMT LEADCHNL RA PACING THRESHOLD PULSEWIDTH: 0.5 ms
MDC IDC MSMT LEADCHNL RV PACING THRESHOLD AMPLITUDE: 1 V
MDC IDC PG IMPLANT DT: 20190419
MDC IDC PG SERIAL: 9008098
MDC IDC SESS DTM: 20191031031645
MDC IDC SET LEADCHNL LV PACING AMPLITUDE: 2 V
MDC IDC SET LEADCHNL LV PACING PULSEWIDTH: 1 ms
MDC IDC STAT BRADY AS VS PERCENT: 1 %

## 2018-05-01 ENCOUNTER — Encounter: Payer: Self-pay | Admitting: Internal Medicine

## 2018-05-01 ENCOUNTER — Ambulatory Visit (INDEPENDENT_AMBULATORY_CARE_PROVIDER_SITE_OTHER): Payer: Medicare Other | Admitting: Pharmacist

## 2018-05-01 ENCOUNTER — Ambulatory Visit (INDEPENDENT_AMBULATORY_CARE_PROVIDER_SITE_OTHER): Payer: Medicare Other | Admitting: Internal Medicine

## 2018-05-01 VITALS — BP 136/72 | HR 62 | Ht 71.0 in | Wt 343.8 lb

## 2018-05-01 DIAGNOSIS — I421 Obstructive hypertrophic cardiomyopathy: Secondary | ICD-10-CM | POA: Diagnosis not present

## 2018-05-01 DIAGNOSIS — Z950 Presence of cardiac pacemaker: Secondary | ICD-10-CM | POA: Diagnosis not present

## 2018-05-01 DIAGNOSIS — I4891 Unspecified atrial fibrillation: Secondary | ICD-10-CM | POA: Diagnosis not present

## 2018-05-01 DIAGNOSIS — I442 Atrioventricular block, complete: Secondary | ICD-10-CM | POA: Diagnosis not present

## 2018-05-01 DIAGNOSIS — G459 Transient cerebral ischemic attack, unspecified: Secondary | ICD-10-CM

## 2018-05-01 DIAGNOSIS — Z5181 Encounter for therapeutic drug level monitoring: Secondary | ICD-10-CM | POA: Diagnosis not present

## 2018-05-01 DIAGNOSIS — I472 Ventricular tachycardia: Secondary | ICD-10-CM | POA: Diagnosis not present

## 2018-05-01 DIAGNOSIS — Z952 Presence of prosthetic heart valve: Secondary | ICD-10-CM | POA: Diagnosis not present

## 2018-05-01 DIAGNOSIS — I480 Paroxysmal atrial fibrillation: Secondary | ICD-10-CM | POA: Diagnosis not present

## 2018-05-01 DIAGNOSIS — Z9889 Other specified postprocedural states: Secondary | ICD-10-CM | POA: Diagnosis not present

## 2018-05-01 DIAGNOSIS — I4729 Other ventricular tachycardia: Secondary | ICD-10-CM

## 2018-05-01 LAB — POCT INR: INR: 3 (ref 2.0–3.0)

## 2018-05-01 NOTE — Progress Notes (Signed)
Patient Care Team: [name], MD as PCP - General (Family Medicine) [name], MD as Consulting Physician (Cardiology)   HPI  Frank [name] is a 73 y.o. male Seen in followup for hypertrophic cardiomyopathy,  status post myectomy with mechanical mitral valve replacement and aortic valve repair. 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 His device reached ERI.  With noise on his RV lead, he underwent CRT upgrade.  This was complicated by hematoma some erythema.  It became infected and required explantation.  He had a temporary permanent pacemaker implanted.  This dislodged and he underwent acute CRT implantation on the right side.  He then had problems with poor healing.  4 weeks, we were packing.  Dr. Lovena Le weighed in and started with exuberant washing.  Both wounds healed over the subsequent couple of weeks.     DATE TEST EF   4/17    echo    55-60 % Severe LVH (18/17). BAE  PA press 62 RV normal  10/18 Echo  E 45-50% Some segmental WMA Severe RVE, depressed EF LAE (63/2.1/33)  7/19 Echo  65% Severe LAE     CPX test done about 2010 demonstrated a VO2 max of 12.2 representing 66.4% predicted. his VO2 adjusted for body weight was 25.9 His slope was 34 and his O2 pulse was 17, 88% of predicted.   Date Cr K Hgb  10/18 1.02  12.4   4/19  1.18 4.1 11.1    He has significantly improved energy with chronic but stable edema.  No chest pain.  Past Medical History:  Diagnosis Date  . Atrial arrhythmia    status post ablation in [place] with complication including damage to the aortic valve  . Atrial fibrillation ([place])   . AV block, 1st degree   . Bradycardia   . Cluster headache ~ 1980   "went away after I started taking one of the heart RXs" (06/29/2017)  . Diastolic CHF, chronic ([place])   . Diverticulosis   . History of gout   . HTN (hypertension)   . Hypertrophic cardiomyopathy ([place])    s/p myoectomy  .  Infection   . Internal hemorrhoids   . Morbid obesity ([place])   . Morbid obesity with BMI of 45.0-49.9, adult ([place])   . Pacemaker  -SJM   . Skin cancer    "burned off RUE X 1; cut off nose X 2" (06/29/2017)  . TIA (transient ischemic attack) 2001  . Tubulovillous adenoma     Past Surgical History:  Procedure Laterality Date  . 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 BIV PACEMAKER INSERTION CRT-P N/A 07/27/2017   Procedure: BIV PACEMAKER INSERTION CRT-P;  Surgeon: [name], MD;  Location: [facility];  Service: Cardiovascular;  Laterality: N/A;  . BIV UPGRADE N/A 05/28/2017   Procedure: BIV [place];  Surgeon: [name], MD;  Location: [facility];  Service: Cardiovascular;  Laterality: N/A;  . CARDIAC CATHETERIZATION     "couple times" (06/29/2017)  . CARDIAC VALVE REPLACEMENT    . COLONOSCOPY    . COLONOSCOPY WITH PROPOFOL N/A 04/13/2016   Procedure: COLONOSCOPY WITH PROPOFOL;  Surgeon: Gatha Mayer, MD;  Location: [facility];  Service: Endoscopy;  Laterality: N/A;  . INSERT / REPLACE / REMOVE PACEMAKER  ~ [address] [employer]   . 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  . PACEMAKER LEAD REMOVAL N/A 06/29/2017   Procedure: [id number]  REMOVAL AND Four LEAD REMOVAL;  Surgeon: Evans Lance, MD;  Location: [facility];  Service: Cardiovascular;  Laterality: N/A;  . PACEMAKER REMOVAL  06/29/2017   GERNERATOR  REMOVAL AND Four LEAD REMOVAL; put in external pacemaker (06/29/2017)  . septal myectomy  07/22/1997  . SKIN CANCER EXCISION      Current Outpatient Medications  Medication Sig Dispense Refill  . acetaminophen (TYLENOL) 325 MG tablet Take 2 tablets (650 mg total) by mouth every 6 (six) hours as needed for mild pain.    . Ascorbic Acid (VITAMIN C) 1000 MG tablet Take 2,000 mg by mouth daily.      Marland Kitchen aspirin 81 MG tablet Take 81 mg by mouth daily.    Marland Kitchen COUMADIN 10 MG tablet  TAKE AS DIRECTED BY COUMADIN CLINIC 120 tablet 1  . diphenhydramine-acetaminophen (TYLENOL PM) 25-500 MG TABS tablet Take 2 tablets by mouth at bedtime as needed (for sleep).    . fish oil-omega-3 fatty acids 1000 MG capsule Take 4 g by mouth every morning.     Marland Kitchen losartan (COZAAR) 100 MG tablet TAKE 1 TABLET BY MOUTH ONCE DAILY 90 tablet 2  . Magnesium Oxide 250 MG TABS Take 1 tablet (250 mg total) by mouth 3 (three) times daily. 270 tablet 3  . Multiple Vitamin (MULTIVITAMIN) tablet Take 1 tablet by mouth daily.      . potassium chloride (K-DUR,KLOR-CON) 10 MEQ tablet Take 9 tablets (90 meq) by mouth once daily    . spironolactone (ALDACTONE) 25 MG tablet TAKE 1 TABLET BY MOUTH ONCE DAILY 90 tablet 2  . torsemide (DEMADEX) 20 MG tablet TAKE 2 TABLETS BY MOUTH TWICE DAILY 360 tablet 2   No current facility-administered medications for this visit.     Allergies  Allergen Reactions  . Warfarin And Related Other (See Comments)    Per the patient's wife, "Warfain made him have a TIA" CAN TAKE BRAND NAME COUMADIN   . Lovenox [Enoxaparin Sodium] Other (See Comments)    Causes large lumps at injection site    Review of Systems negative except from HPI and PMH  Physical Exam BP 136/72   Pulse 62   Ht 5\' 11"  (1.803 m)   Wt (!) 343 lb 12.8 oz (155.9 kg)   BMI 47.95 kg/m  Well developed and Morbidly obese  in no acute distress HENT normal Neck supple with JVP-10 Regular rate and rhythm, no murmurs or gallops Abd-soft with active BS No Clubbing cyanosis 2+ edema Skin-warm and dry A & Oriented  Grossly normal sensory and motor function   ECG demonstrated AV pacing with an upright QRS lead V1 and negative QRS lead I  Assessment and  Plan  Atrial fibrillation-paroxysmal  Mitral valve replacement-mechanical  Hypertrophic cardiomyopathy status post myectomy  Cardiomyopathy-pacer induced-resolved  HFpEF  Complete heart block  VT  Nonsustained    No intercurrent Ventricular  tachycardia  Scant AF which is somewhat surprising given the very large left atrial dimensions in the context of his HCM, mitral valve disease and history of AF.  However, pleasantly surprising.  LV function has normalized with CRT  Continue his current diuretic regime.  On Anticoagulation;  No bleeding issues   Will check blood work on  anticoagulation a nd aldactone

## 2018-05-01 NOTE — Patient Instructions (Signed)
Medication Instructions:  Your physician recommends that you continue on your current medications as directed. Please refer to the Current Medication list given to you today.  Labwork: You will have labs drawn today: CBC and BMP  Testing/Procedures: None ordered.  Follow-Up: Your physician recommends that you schedule a follow-up appointment in:   6 months with Dr [name]  Any Other Special Instructions Will Be Listed Below (If Applicable).     If you need a refill on your cardiac medications before your next appointment, please call your pharmacy.

## 2018-05-02 LAB — CUP PACEART INCLINIC DEVICE CHECK
Brady Statistic RA Percent Paced: 98 %
Brady Statistic RV Percent Paced: 99.18 %
Date Time Interrogation Session: 20200122202946
Implantable Lead Implant Date: 20190419
Implantable Lead Implant Date: 20190419
Implantable Lead Implant Date: 20190419
Implantable Lead Location: 753859
Implantable Pulse Generator Implant Date: 20190419
Lead Channel Impedance Value: 412.5 Ohm
Lead Channel Impedance Value: 450 Ohm
Lead Channel Pacing Threshold Amplitude: 0.5 V
Lead Channel Pacing Threshold Amplitude: 0.5 V
Lead Channel Pacing Threshold Pulse Width: 0.5 ms
Lead Channel Pacing Threshold Pulse Width: 0.5 ms
Lead Channel Sensing Intrinsic Amplitude: 0.5 mV
Lead Channel Setting Pacing Amplitude: 2 V
Lead Channel Setting Pacing Amplitude: 2 V
Lead Channel Setting Pacing Pulse Width: 1 ms
Lead Channel Setting Sensing Sensitivity: 2 mV
MDC IDC LEAD LOCATION: 753858
MDC IDC LEAD LOCATION: 753860
MDC IDC MSMT BATTERY REMAINING LONGEVITY: 70 mo
MDC IDC MSMT BATTERY VOLTAGE: 2.98 V
MDC IDC MSMT LEADCHNL LV PACING THRESHOLD AMPLITUDE: 0.75 V
MDC IDC MSMT LEADCHNL LV PACING THRESHOLD PULSEWIDTH: 1 ms
MDC IDC MSMT LEADCHNL RA PACING THRESHOLD AMPLITUDE: 0.75 V
MDC IDC MSMT LEADCHNL RA PACING THRESHOLD AMPLITUDE: 0.75 V
MDC IDC MSMT LEADCHNL RA PACING THRESHOLD PULSEWIDTH: 0.5 ms
MDC IDC MSMT LEADCHNL RA PACING THRESHOLD PULSEWIDTH: 0.5 ms
MDC IDC MSMT LEADCHNL RV IMPEDANCE VALUE: 400 Ohm
MDC IDC MSMT LEADCHNL RV SENSING INTR AMPL: 12 mV
MDC IDC PG SERIAL: 9008098
MDC IDC SET LEADCHNL RV PACING AMPLITUDE: 2.5 V
MDC IDC SET LEADCHNL RV PACING PULSEWIDTH: 0.5 ms
Pulse Gen Model: 3262

## 2018-05-02 LAB — CBC WITH DIFFERENTIAL/PLATELET
Basophils Absolute: 0.1 x10E3/uL (ref 0.0–0.2)
Basos: 1 %
EOS (ABSOLUTE): 0.2 x10E3/uL (ref 0.0–0.4)
Eos: 2 %
Hematocrit: 37.1 % — ABNORMAL LOW (ref 37.5–51.0)
Hemoglobin: 12.6 g/dL — ABNORMAL LOW (ref 13.0–17.7)
Immature Grans (Abs): 0.1 x10E3/uL (ref 0.0–0.1)
Immature Granulocytes: 1 %
Lymphocytes Absolute: 1.3 x10E3/uL (ref 0.7–3.1)
Lymphs: 15 %
MCH: 33.4 pg — ABNORMAL HIGH (ref 26.6–33.0)
MCHC: 34 g/dL (ref 31.5–35.7)
MCV: 98 fL — ABNORMAL HIGH (ref 79–97)
Monocytes Absolute: 1.2 x10E3/uL — ABNORMAL HIGH (ref 0.1–0.9)
Monocytes: 14 %
Neutrophils Absolute: 6 x10E3/uL (ref 1.4–7.0)
Neutrophils: 67 %
Platelets: 245 x10E3/uL (ref 150–450)
RBC: 3.77 x10E6/uL — ABNORMAL LOW (ref 4.14–5.80)
RDW: 13.3 % (ref 11.6–15.4)
WBC: 8.9 x10E3/uL (ref 3.4–10.8)

## 2018-05-02 LAB — BASIC METABOLIC PANEL
BUN/Creatinine Ratio: 21 (ref 10–24)
BUN: 22 mg/dL (ref 8–27)
CALCIUM: 9.5 mg/dL (ref 8.6–10.2)
CHLORIDE: 99 mmol/L (ref 96–106)
CO2: 25 mmol/L (ref 20–29)
Creatinine, Ser: 1.05 mg/dL (ref 0.76–1.27)
GFR calc non Af Amer: 72 mL/min/{1.73_m2} (ref >59)
GFR, EST AFRICAN AMERICAN: 83 mL/min/{1.73_m2} (ref >59)
Glucose: 97 mg/dL (ref 65–99)
POTASSIUM: 4.4 mmol/L (ref 3.5–5.2)
Sodium: 141 mmol/L (ref 134–144)

## 2018-05-15 ENCOUNTER — Other Ambulatory Visit: Payer: Self-pay | Admitting: Physician Assistant

## 2018-06-18 ENCOUNTER — Ambulatory Visit (INDEPENDENT_AMBULATORY_CARE_PROVIDER_SITE_OTHER): Payer: Medicare Other

## 2018-06-18 DIAGNOSIS — Z5181 Encounter for therapeutic drug level monitoring: Secondary | ICD-10-CM | POA: Diagnosis not present

## 2018-06-18 DIAGNOSIS — Z9889 Other specified postprocedural states: Secondary | ICD-10-CM | POA: Diagnosis not present

## 2018-06-18 DIAGNOSIS — I4891 Unspecified atrial fibrillation: Secondary | ICD-10-CM

## 2018-06-18 DIAGNOSIS — G459 Transient cerebral ischemic attack, unspecified: Secondary | ICD-10-CM

## 2018-06-18 LAB — POCT INR: INR: 2.9 (ref 2.0–3.0)

## 2018-06-18 NOTE — Patient Instructions (Signed)
Description   Continue on same dosage 10mg  daily except 15mg  on Sundays and Wednesdays. Recheck in 6 weeks.  Call with any concerns any new medications or if scheduled for any procedures  [phone]

## 2018-07-09 ENCOUNTER — Encounter: Payer: Self-pay | Admitting: *Deleted

## 2018-07-09 ENCOUNTER — Encounter: Payer: Self-pay | Admitting: Family Medicine

## 2018-07-09 ENCOUNTER — Ambulatory Visit: Payer: Self-pay | Admitting: *Deleted

## 2018-07-09 NOTE — Telephone Encounter (Signed)
This encounter was created in error - please disregard.

## 2018-07-09 NOTE — Telephone Encounter (Signed)
Message from Berneta Levins sent at 07/09/2018 4:19 PM EDT   Summary: fall/pain in arm   Pt called and left message on [facility] 07/08/2018. States that he fell 4 days ago and there is still a great amount of pain in arm. Pt is afraid to go out for x-ray and needs to know what to do.          Returned call to patient regarding the fall he had on last Thursday. And that he fell on his left arm and now has a large bruise to that arm. The bruise extends from near the shoulder down to between his elbow and wrist. He also has a bruise on his left side near the rib cage.Marland Kitchen He did not go to the hospital. He is on a blood thinner. He has been taking Tylenol and using bio freeze to the arm. He is requesting pain med for that arm. He is trying to avoid taking too much Tylenol. Advised pt of possibly having a virtual visit. His # is [phone] and his e mail address is   [email or web address] Routing to flow at [facility] [place]. He is also sending a picture of his arm of his injury thru [place]. Advised to call back for any concerns. Pt voiced understanding.  Reason for Disposition . Large swelling or bruise (> 2 inches or 5 cm)  Answer Assessment - Initial Assessment Questions 1. MECHANISM: "How did the injury happen?"     Getting out of bed, maybe sleep walking 2. ONSET: "When did the injury happen?" (Minutes or hours ago)      Last Thursday morning 3. LOCATION: "Where is the injury located?"      Left arm 4. APPEARANCE of INJURY: "What does the injury look like?"      Very bruised 5. SEVERITY: "Can you use the arm normally?"      Can wash hands but can not lift anything, can not use like normal 6. SWELLING or BRUISING: "is there any swelling or bruising?" If so, ask: "How large is it? (e.g., inches, centimeters)      Yes bruising 7. PAIN: "Is there pain?" If so, ask: "How bad is the pain?"    (Scale 1-10; or mild, moderate, severe)     Pain # 3 or 4 now 8. TETANUS:  For any breaks in the skin, ask: "When was the last tetanus booster?"     No breaks 9. OTHER SYMPTOMS: "Do you have any other symptoms?"  (e.g., numbness in hand)     No more than normal 10. PREGNANCY: "Is there any chance you are pregnant?" "When was your last menstrual period?"       n/a  Protocols used: ARM INJURY-A-AH

## 2018-07-10 ENCOUNTER — Ambulatory Visit (INDEPENDENT_AMBULATORY_CARE_PROVIDER_SITE_OTHER): Payer: Medicare Other | Admitting: General Practice

## 2018-07-10 ENCOUNTER — Other Ambulatory Visit: Payer: Self-pay

## 2018-07-10 ENCOUNTER — Ambulatory Visit (INDEPENDENT_AMBULATORY_CARE_PROVIDER_SITE_OTHER): Payer: Medicare Other | Admitting: Family Medicine

## 2018-07-10 ENCOUNTER — Encounter: Payer: Self-pay | Admitting: Family Medicine

## 2018-07-10 ENCOUNTER — Ambulatory Visit (INDEPENDENT_AMBULATORY_CARE_PROVIDER_SITE_OTHER): Payer: Medicare Other

## 2018-07-10 VITALS — BP 130/70 | HR 74 | Temp 97.9°F | Wt 252.0 lb

## 2018-07-10 DIAGNOSIS — M79602 Pain in left arm: Secondary | ICD-10-CM | POA: Diagnosis not present

## 2018-07-10 DIAGNOSIS — R58 Hemorrhage, not elsewhere classified: Secondary | ICD-10-CM

## 2018-07-10 DIAGNOSIS — G459 Transient cerebral ischemic attack, unspecified: Secondary | ICD-10-CM

## 2018-07-10 DIAGNOSIS — Z5181 Encounter for therapeutic drug level monitoring: Secondary | ICD-10-CM | POA: Diagnosis not present

## 2018-07-10 DIAGNOSIS — I5032 Chronic diastolic (congestive) heart failure: Secondary | ICD-10-CM

## 2018-07-10 DIAGNOSIS — S299XXA Unspecified injury of thorax, initial encounter: Secondary | ICD-10-CM | POA: Diagnosis not present

## 2018-07-10 DIAGNOSIS — Z7901 Long term (current) use of anticoagulants: Secondary | ICD-10-CM | POA: Diagnosis not present

## 2018-07-10 DIAGNOSIS — R600 Localized edema: Secondary | ICD-10-CM

## 2018-07-10 DIAGNOSIS — Z9889 Other specified postprocedural states: Secondary | ICD-10-CM

## 2018-07-10 DIAGNOSIS — M25512 Pain in left shoulder: Secondary | ICD-10-CM | POA: Diagnosis not present

## 2018-07-10 LAB — POCT INR: INR: 2.7 (ref 2.0–3.0)

## 2018-07-10 NOTE — Telephone Encounter (Signed)
Pt has been scheduled for Web Ex appointment with dr Volanda Napoleon at 10 am

## 2018-07-10 NOTE — Patient Instructions (Incomplete)
Pre visit review using our clinic review tool, if applicable. No additional management support is needed unless otherwise documented below in the visit note.  Continue on same dosage 10mg  daily except 15mg  on Sundays and Wednesdays. Recheck in 6 weeks.  Call with any concerns any new medications or if scheduled for any procedures  [phone]

## 2018-07-10 NOTE — Progress Notes (Signed)
Subjective:    Patient ID: Frank [name], male    DOB: 05-Sep-1945, 73 y.o.   MRN: [id number]  No chief complaint on file.   HPI   Pt is a 73 yo male with pmh sig for HTN, h/o TIA, combined CHF, afib, nonischemic cardiomyopathy, St. Jude pacemaker in place, pulm HTN, s/p aortic and mitral valve repair, OSA, gout, obesity  Patient was seen today for f/u to webex visit.  Pt with bruising along L side and L arm s/p fall 5 days ago while sleep walking?  Pt thinks he fell hitting the side of the granite topped nightstand.  Since incident, pt with pain along left side that is improving.  Pt unable to lift his L arm, but states he does not have pain in the L shoulder.  Pt is on chronic anticoagulation 2/2 h/o afib,  Followed by coumadin clinic.  Pt also with 10 lb wt gain in last month, increased LE edema, and SOB today when walking in clinic. Followed by Cards for CHF.  Taking spironolactone 25 mg daily, torsemide 40 mg BID, Kdur, magnesium.  Pt has not increased his torsemide dose, elevated LEs, or contacted Cardiology.  Past Medical History:  Diagnosis Date  . Atrial arrhythmia    status post ablation in [place] with complication including damage to the aortic valve  . Atrial fibrillation ([place])   . AV block, 1st degree   . Bradycardia   . Cluster headache ~ 1980   "went away after I started taking one of the heart RXs" (06/29/2017)  . Diastolic CHF, chronic ([place])   . Diverticulosis   . History of gout   . HTN (hypertension)   . Hypertrophic cardiomyopathy ([place])    s/p myoectomy  . Infection   . Internal hemorrhoids   . Morbid obesity ([place])   . Morbid obesity with BMI of 45.0-49.9, adult ([place])   . Pacemaker  -SJM   . Skin cancer    "burned off RUE X 1; cut off nose X 2" (06/29/2017)  . TIA (transient ischemic attack) 2001  . Tubulovillous adenoma     Allergies  Allergen Reactions  . Warfarin And Related Other (See Comments)    Per the patient's wife, "Warfain made him have a TIA"  CAN TAKE BRAND NAME COUMADIN   . Lovenox [Enoxaparin Sodium] Other (See Comments)    Causes large lumps at injection site    ROS General: Denies fever, chills, night sweats, changes in weight, changes in appetite HEENT: Denies headaches, ear pain, changes in vision, rhinorrhea, sore throat CV: Denies CP, palpitations, orthopnea  +SOB, LE edema Pulm: Denies cough, wheezing  +SOB GI: Denies abdominal pain, nausea, vomiting, diarrhea, constipation GU: Denies dysuria, hematuria, frequency, vaginal discharge Msk: Denies muscle cramps, joint pains  +L arm pain Neuro: Denies weakness, numbness, tingling Skin: Denies rashes   + bruising Psych: Denies depression, anxiety, hallucinations    Objective:    Blood pressure 130/70, pulse 74, temperature 97.9 F (36.6 C), temperature source Oral, weight 252 lb (114.3 kg), SpO2 94 %.  Gen. Pleasant, well-nourished, in no distress, normal affect   HEENT: [place]/AT, face symmetric, no scleral icterus, PERRLA, nares patent without drainage Lungs: no accessory muscle use, CTAB, no wheezes or rales Cardiovascular: RRR, no m/r/g, 2+ pitting edema in R ankle.  RLE edema>LLE edema Abdomen: BS present, soft, large circumferential area of ecchymosis on left lower abdomen. Musculoskeletal: Limited ROM in LUE, large area of ecchymosis on L medial upper arm.  No TTP of L shoulder, clavicle or humeral head.  TTP along area of ecchymosis.  L hand edema noted.  Unable to remove pt's wedding band, L ring finger with mild to moderate edema, able to move ring around without difficulty, no discoloration or decreased capillary refill.  No cyanosis or clubbing, normal tone Neuro:  A&Ox3, CN II-XII intact, normal gait Skin:  Warm, dry, large areas of ecchymosis.  Picture below taken by pt via mychart.    Wt Readings from Last 3 Encounters:  07/10/18 252 lb (114.3 kg)  05/01/18 (!) 343 lb 12.8 oz (155.9 kg)  12/17/17 (!) 341 lb 1.6 oz (154.7 kg)    Lab Results   Component Value Date   WBC 8.9 05/01/2018   HGB 12.6 (L) 05/01/2018   HCT 37.1 (L) 05/01/2018   PLT 245 05/01/2018   GLUCOSE 97 05/01/2018   CHOL 176 07/28/2015   TRIG 143 07/28/2015   HDL 34 (L) 07/28/2015   LDLCALC 113 07/28/2015   ALT 15 (L) 01/18/2017   AST 30 01/18/2017   NA 141 05/01/2018   K 4.4 05/01/2018   CL 99 05/01/2018   CREATININE 1.05 05/01/2018   BUN 22 05/01/2018   CO2 25 05/01/2018   TSH 3.296 01/18/2017   INR 2.9 06/18/2018   HGBA1C 5.7 (H) 09/18/2017    Assessment/Plan:  Ecchymosis  -2/2 chronic coumadin use -supportive care -continue to monitor areas for increasing ecchymosis -given handout - Plan: DG Shoulder Left  Left arm pain  -likely 2/2 contusion -discussed ice, Tylenol, elevating arm -given handout -Removal of wedding band advised given edema in hand.  Discussed ways to remove wedding band  - Plan: DG Shoulder Left  Chronic anticoagulation -INR checked today, 2.7 -continue current regimen -appointment made for regular INR check in 6 wks.  Bilateral leg edema  -likey 2/2 CHF exacerbation -elevate LEs, reduce sodium intake -given form for TED hose. -will take an additional Torsemide (20 mg) tab daily for the next 3-4 days - Plan: DG Chest 2 View  DIASTOLIC HEART FAILURE, CHRONIC -discussed elevating lower extremities  -given form for TED hose. -continue lifestyle modificaitons -discussed increasing torsemide by one pill (20 mg) daily for the next 3-4 days as needed for LE edema.  Pt will also need to increase Kdur -advised to contact Cardiology -continue daily weights.  F/u prn for worsening symptoms.  Grier Mitts, MD

## 2018-07-10 NOTE — Progress Notes (Signed)
Virtual Visit via Video Note  I connected with Frank [name] on 07/10/18 at 10:00 AM EDT by a video enabled telemedicine application and verified that I am speaking with the correct person using two identifiers.  Location patient: home Location provider:home office Persons participating in the virtual visit: patient, provider  I discussed the limitations of evaluation and management by telemedicine and the availability of in person appointments. The patient expressed understanding and agreed to proceed.   HPI: Pt fell Thursday am getting out of bed, may have been sleep walking.  Fell between the bed and a granite top night stand.  The pain is improving.   Bruise of L arm growing.  Also with bruising of L flank.  Does not hurt as much.  Unable to lift L arm.  Endorses    ROS: See pertinent positives and negatives per HPI.  Past Medical History:  Diagnosis Date  . Atrial arrhythmia    status post ablation in [place] with complication including damage to the aortic valve  . Atrial fibrillation ([place])   . AV block, 1st degree   . Bradycardia   . Cluster headache ~ 1980   "went away after I started taking one of the heart RXs" (06/29/2017)  . Diastolic CHF, chronic ([place])   . Diverticulosis   . History of gout   . HTN (hypertension)   . Hypertrophic cardiomyopathy ([place])    s/p myoectomy  . Infection   . Internal hemorrhoids   . Morbid obesity ([place])   . Morbid obesity with BMI of 45.0-49.9, adult ([place])   . Pacemaker  -SJM   . Skin cancer    "burned off RUE X 1; cut off nose X 2" (06/29/2017)  . TIA (transient ischemic attack) 2001  . Tubulovillous adenoma     Past Surgical History:  Procedure Laterality Date  . 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 BIV PACEMAKER INSERTION CRT-P N/A 07/27/2017   Procedure: BIV PACEMAKER INSERTION CRT-P;  Surgeon: [name], MD;  Location: [facility];  Service:  Cardiovascular;  Laterality: N/A;  . BIV UPGRADE N/A 05/28/2017   Procedure: BIV [place];  Surgeon: [name], MD;  Location: [facility];  Service: Cardiovascular;  Laterality: N/A;  . CARDIAC CATHETERIZATION     "couple times" (06/29/2017)  . CARDIAC VALVE REPLACEMENT    . COLONOSCOPY    . COLONOSCOPY WITH PROPOFOL N/A 04/13/2016   Procedure: COLONOSCOPY WITH PROPOFOL;  Surgeon: Gatha Mayer, MD;  Location: [facility];  Service: Endoscopy;  Laterality: N/A;  . INSERT / REPLACE / REMOVE PACEMAKER  ~ [address] [employer]   . 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  . PACEMAKER LEAD REMOVAL N/A 06/29/2017   Procedure: [id number]  REMOVAL AND Four LEAD REMOVAL;  Surgeon: Evans Lance, MD;  Location: [facility];  Service: Cardiovascular;  Laterality: N/A;  . PACEMAKER REMOVAL  06/29/2017   GERNERATOR  REMOVAL AND Four LEAD REMOVAL; put in external pacemaker (06/29/2017)  . septal myectomy  07/22/1997  . SKIN CANCER EXCISION      Family History  Problem Relation Age of Onset  . Heart disease Sister   . Uterine cancer Sister   . Obesity Sister        500+ lbs  . Heart disease Brother   . Heart disease Sister   . Heart disease Father   . Colon cancer  Father 19  . Crohn's disease Paternal Grandmother     SOCIAL HX:    Current Outpatient Medications:  .  acetaminophen (TYLENOL) 325 MG tablet, Take 2 tablets (650 mg total) by mouth every 6 (six) hours as needed for mild pain., Disp: , Rfl:  .  Ascorbic Acid (VITAMIN C) 1000 MG tablet, Take 2,000 mg by mouth daily.  , Disp: , Rfl:  .  aspirin 81 MG tablet, Take 81 mg by mouth daily., Disp: , Rfl:  .  COUMADIN 10 MG tablet, TAKE AS DIRECTED BY COUMADIN CLINIC, Disp: 120 tablet, Rfl: 1 .  diphenhydramine-acetaminophen (TYLENOL PM) 25-500 MG TABS tablet, Take 2 tablets by mouth at bedtime as needed (for sleep)., Disp: , Rfl:  .  fish oil-omega-3 fatty acids 1000 MG capsule, Take 4 g by mouth every morning.  , Disp: , Rfl:  .  KLOR-CON 10 10 MEQ tablet, Take 9 tablets (90 mEq total) by mouth daily., Disp: 270 tablet, Rfl: 11 .  losartan (COZAAR) 100 MG tablet, TAKE 1 TABLET BY MOUTH ONCE DAILY, Disp: 90 tablet, Rfl: 2 .  Magnesium Oxide 250 MG TABS, Take 1 tablet (250 mg total) by mouth 3 (three) times daily., Disp: 270 tablet, Rfl: 3 .  Multiple Vitamin (MULTIVITAMIN) tablet, Take 1 tablet by mouth daily.  , Disp: , Rfl:  .  potassium chloride (K-DUR,KLOR-CON) 10 MEQ tablet, Take 9 tablets (90 meq) by mouth once daily, Disp: , Rfl:  .  spironolactone (ALDACTONE) 25 MG tablet, TAKE 1 TABLET BY MOUTH ONCE DAILY, Disp: 90 tablet, Rfl: 2 .  torsemide (DEMADEX) 20 MG tablet, TAKE 2 TABLETS BY MOUTH TWICE DAILY, Disp: 360 tablet, Rfl: 2  EXAM:  VITALS per patient if applicable:  GENERAL: alert, oriented, appears well and in no acute distress  HEENT: atraumatic, conjunttiva clear, no obvious abnormalities on inspection of external nose and ears  NECK: normal movements of the head and neck  LUNGS: on inspection no signs of respiratory distress, breathing rate appears normal, no obvious gross SOB, gasping or wheezing  CV: no obvious cyanosis  MS: moves all visible extremities without noticeable abnormality  PSYCH/NEURO: pleasant and cooperative, no obvious depression or anxiety, speech and thought processing grossly intact  ASSESSMENT AND PLAN:  Discussed the following assessment and plan:  Ecchymosis -given extensive bruising pt to be seen this afternoon in clinic for further eval.  Left arm pain -given inability to lift L arm pt will be evaluated in clinic this afternoon.  Chronic anticoagulation   I discussed the assessment and treatment plan with the patient. The patient was provided an opportunity to ask questions and all were answered. The patient agreed with the plan and demonstrated an understanding of the instructions.   The patient was advised to call back or seek an in-person  evaluation if the symptoms worsen or if the condition fails to improve as anticipated.   [name], MD

## 2018-07-10 NOTE — Telephone Encounter (Signed)
Set up Webex with Dr. Volanda Napoleon.

## 2018-07-10 NOTE — Patient Instructions (Signed)
Heart Failure Exacerbation  Heart failure is a condition in which the heart does not fill up with enough blood, and therefore does not pump enough blood and oxygen to the body. When this happens, parts of the body do not get the blood and oxygen they need to function properly. This can cause symptoms such as breathing problems, fatigue, swelling, and confusion. Heart failure exacerbation refers to heart failure symptoms that get worse. The symptoms may get worse suddenly or develop slowly over time. Heart failure exacerbation is a serious medical problem that should be treated right away. What are the causes? A heart failure exacerbation can be triggered by:  Not taking your heart failure medicines correctly.  Infections.  Eating an unhealthy diet or a diet that is high in salt (sodium).  Drinking too much fluid.  Drinking alcohol.  Taking illegal drugs, such as cocaine or methamphetamine.  Not exercising. Other causes include:  Other heart conditions such as an irregular heartbeat (arrhythmia).  Anemia.  Other medical problems, such as kidney failure. Sometimes the cause of the exacerbation is not known. What are the signs or symptoms? When heart failure symptoms suddenly or slowly get worse, this may be a sign of heart failure exacerbation. Symptoms of heart failure include:  Breathing problems or shortness of breath.  Chronic coughing or wheezing.  Fatigue.  Nausea or lack of appetite.  Feeling light-headed.  Confusion or memory loss.  Increased heart rate or irregular heartbeat.  Buildup of fluid in the legs, ankles, feet, or abdomen.  Difficulty breathing when lying down. How is this diagnosed? This condition is diagnosed based on:  Your symptoms and medical history.  A physical exam. You may also have tests, including:  Electrocardiogram (ECG). This test measures the electrical activity of your heart.  Echocardiogram. This test uses sound waves to take a  picture of your heart to see how well it works.  Blood tests.  Imaging tests, such as: ? Chest X-ray. ? MRI. ? Ultrasound.  Stress test. This test examines how well your heart functions when you exercise. Your heart is monitored while you exercise on a treadmill or exercise bike. If you cannot exercise, medicines may be used to increase your heartbeat in place of exercise.  Cardiac catheterization. During this test, a thin, flexible tube (catheter) is inserted into a blood vessel and threaded up to your heart. This test allows your health care provider to check the arteries that lead to your heart (coronary arteries).  Right heart catheterization. During this test, the pressure in your heart is measured. How is this treated? This condition may be treated by:  Adjusting your heart medicines.  Maintaining a healthy lifestyle. This includes: ? Eating a heart-healthy diet that is low in sodium. ? Not using any products that contain nicotine or tobacco, such as cigarettes and e-cigarettes. ? Regular exercise. ? Monitoring your fluid intake. ? Monitoring your weight and reporting changes to your health care provider.  Treating sleep apnea, if you have this condition.  Surgery. This may include: ? Implanting a device that helps both sides of your heart contract at the same time (cardiac resynchronization therapy device). This can help with heart function and relieve heart failure symptoms. ? Implanting a device that can correct heart rhythm problems (implantable cardioverter defibrillator). ? Connecting a device to your heart to help it pump blood (ventricular assist device). ? Heart transplant. Follow these instructions at home: Medicines  Take over-the-counter and prescription medicines only as told by your  health care provider.  Do not stop taking your medicines or change the amount you take. If you are having problems or side effects from your medicines, talk to your health care  provider.  If you are having difficulty paying for your medicines, contact a social worker or your clinic. There are many programs to assist with medicine costs.  Talk to your health care provider before starting any new medicines or supplements.  Make sure your health care provider and pharmacist have a list of all the medicines you are taking. Eating and drinking   Avoid drinking alcohol.  Eat a heart-healthy diet as told by your health care provider. This includes: ? Plenty of fruits and vegetables. ? Lean proteins. ? Low-fat dairy. ? Whole grains. ? Foods that are low in sodium. Activity   Exercise regularly as told by your health care provider. Balance exercise with rest.  Ask your health care provider what activities are safe for you. This includes sexual activity, exercise, and daily tasks at home or work. Lifestyle  Do not use any products that contain nicotine or tobacco, such as cigarettes and e-cigarettes. If you need help quitting, ask your health care provider.  Maintain a healthy weight. Ask your health care provider what weight is healthy for you.  Consider joining a patient support group. This can help with emotional problems you may have, such as stress and anxiety. General instructions  Talk to your health care provider about flu and pneumonia vaccines.  Keep a list of medicines that you are taking. This may help in emergency situations.  Keep all follow-up visits as told by your health care provider. This is important. Contact a health care provider if:  You have questions about your medicines or you miss a dose.  You feel anxious, depressed, or stressed.  You have swelling in your feet, ankles, legs, or abdomen.  You have shortness of breath during activity or exercise.  You have a cough.  You have a fever.  You have trouble sleeping.  You gain 2-3 lb (1-1.4 kg) in 24 hours or 5 lb (2.3 kg) in a week. Get help right away if:  You have chest  pain.  You have shortness of breath while resting.  You have severe fatigue.  You are confused.  You have severe dizziness.  You have a rapid or irregular heartbeat.  You have nausea or you vomit.  You have a cough that is worse at night or you cannot lie flat.  You have a cough that will not go away.  You have severe depression or sadness. Summary  When heart failure symptoms get worse, it is called heart failure exacerbation.  Common causes of this condition include taking medicines incorrectly, infections, and drinking alcohol.  This condition may be treated by adjusting medicines, maintaining a healthy lifestyle, or surgery.  Do not stop taking your medicines or change the amount you take. If you are having problems or side effects from your medicines, talk to your health care provider. This information is not intended to replace advice given to you by your health care provider. Make sure you discuss any questions you have with your health care provider. Document Released: 08/08/2016 Document Revised: 08/08/2016 Document Reviewed: 08/08/2016 Elsevier Interactive Patient Education  2019 [employer]  Edema  Edema is an abnormal buildup of fluids in the body tissues and under the skin. Swelling of the legs, feet, and ankles is a common symptom that becomes more likely as you get  older. Swelling is also common in looser tissues, like around the eyes. When the affected area is squeezed, the fluid may move out of that spot and leave a dent for a few moments. This dent is called pitting edema. There are many possible causes of edema. Eating too much salt (sodium) and being on your feet or sitting for a long time can cause edema in your legs, feet, and ankles. Hot weather may make edema worse. Common causes of edema include:  Heart failure.  Liver or kidney disease.  Weak leg blood vessels.  Cancer.  An injury.  Pregnancy.  Medicines.  Being obese.  Low protein  levels in the blood. Edema is usually painless. Your skin may look swollen or shiny. Follow these instructions at home:  Keep the affected body part raised (elevated) above the level of your heart when you are sitting or lying down.  Do not sit still or stand for long periods of time.  Do not wear tight clothing. Do not wear garters on your upper legs.  Exercise your legs to get your circulation going. This helps to move the fluid back into your blood vessels, and it may help the swelling go down.  Wear elastic bandages or support stockings to reduce swelling as told by your health care provider.  Eat a low-salt (low-sodium) diet to reduce fluid as told by your health care provider.  Depending on the cause of your swelling, you may need to limit how much fluid you drink (fluid restriction).  Take over-the-counter and prescription medicines only as told by your health care provider. Contact a health care provider if:  Your edema does not get better with treatment.  You have heart, liver, or kidney disease and have symptoms of edema.  You have sudden and unexplained weight gain. Get help right away if:  You develop shortness of breath or chest pain.  You cannot breathe when you lie down.  You develop pain, redness, or warmth in the swollen areas.  You have heart, liver, or kidney disease and suddenly get edema.  You have a fever and your symptoms suddenly get worse. Summary  Edema is an abnormal buildup of fluids in the body tissues and under the skin.  Eating too much salt (sodium) and being on your feet or sitting for a long time can cause edema in your legs, feet, and ankles.  Keep the affected body part raised (elevated) above the level of your heart when you are sitting or lying down. This information is not intended to replace advice given to you by your health care provider. Make sure you discuss any questions you have with your health care provider. Document  Released: 03/27/2005 Document Revised: 04/29/2016 Document Reviewed: 04/29/2016 Elsevier Interactive Patient Education  2019 [place]  A contusion is a deep bruise. Contusions are the result of a blunt injury to tissues and muscle fibers under the skin. The injury causes bleeding under the skin. The skin overlying the contusion may turn blue, purple, or yellow. Minor injuries will give you a painless contusion, but more severe contusions may stay painful and swollen for a few weeks. What are the causes? This condition is usually caused by a blow, trauma, or direct force to an area of the body. What are the signs or symptoms? Symptoms of this condition include:  Swelling of the injured area.  Pain and tenderness in the injured area.  Discoloration. The area may have redness and then turn blue, purple,  or yellow. How is this diagnosed? This condition is diagnosed based on a physical exam and medical history. An X-ray, CT scan, or MRI may be needed to determine if there are any associated injuries, such as broken bones (fractures). How is this treated? Specific treatment for this condition depends on what area of the body was injured. In general, the best treatment for a contusion is resting, icing, applying pressure to (compression), and elevating the injured area. This is often called the RICE strategy. Over-the-counter anti-inflammatory medicines may also be recommended for pain control. Follow these instructions at home:  Rest the injured area.  If directed, apply ice to the injured area: ? Put ice in a plastic bag. ? Place a towel between your skin and the bag. ? Leave the ice on for 20 minutes, 2-3 times per day.  If directed, apply light compression to the injured area using an elastic bandage. Make sure the bandage is not wrapped too tightly. Remove and reapply the bandage as directed by your health care provider.  If possible, raise (elevate) the injured area  above the level of your heart while you are sitting or lying down.  Take over-the-counter and prescription medicines only as told by your health care provider. Contact a health care provider if:  Your symptoms do not improve after several days of treatment.  Your symptoms get worse.  You have difficulty moving the injured area. Get help right away if:  You have severe pain.  You have numbness in a hand or foot.  Your hand or foot turns pale or cold. This information is not intended to replace advice given to you by your health care provider. Make sure you discuss any questions you have with your health care provider. Document Released: 01/04/2005 Document Revised: 08/05/2015 Document Reviewed: 08/12/2014 Elsevier Interactive Patient Education  2019 [employer].

## 2018-08-08 ENCOUNTER — Ambulatory Visit (INDEPENDENT_AMBULATORY_CARE_PROVIDER_SITE_OTHER): Payer: Medicare Other | Admitting: *Deleted

## 2018-08-08 ENCOUNTER — Other Ambulatory Visit: Payer: Self-pay

## 2018-08-08 ENCOUNTER — Other Ambulatory Visit: Payer: Self-pay | Admitting: Internal Medicine

## 2018-08-08 DIAGNOSIS — I472 Ventricular tachycardia: Secondary | ICD-10-CM

## 2018-08-08 DIAGNOSIS — I4729 Other ventricular tachycardia: Secondary | ICD-10-CM

## 2018-08-09 LAB — CUP PACEART REMOTE DEVICE CHECK
Date Time Interrogation Session: 20200501103805
Implantable Lead Implant Date: 20190419
Implantable Lead Implant Date: 20190419
Implantable Lead Implant Date: 20190419
Implantable Lead Location: 753858
Implantable Lead Location: 753859
Implantable Lead Location: 753860
Implantable Lead Model: 5076
Implantable Lead Model: 5076
Implantable Pulse Generator Implant Date: 20190419
Pulse Gen Model: 3262
Pulse Gen Serial Number: 9008098

## 2018-08-16 ENCOUNTER — Encounter: Payer: Self-pay | Admitting: Cardiology

## 2018-08-16 NOTE — Progress Notes (Signed)
Remote pacemaker transmission.   

## 2018-08-20 ENCOUNTER — Telehealth: Payer: Self-pay

## 2018-08-20 NOTE — Telephone Encounter (Signed)
LMOM FOR PRESCREEN  

## 2018-08-20 NOTE — Telephone Encounter (Signed)

## 2018-08-21 ENCOUNTER — Ambulatory Visit (INDEPENDENT_AMBULATORY_CARE_PROVIDER_SITE_OTHER): Payer: Medicare Other | Admitting: Pharmacist

## 2018-08-21 ENCOUNTER — Other Ambulatory Visit: Payer: Self-pay

## 2018-08-21 DIAGNOSIS — I4891 Unspecified atrial fibrillation: Secondary | ICD-10-CM

## 2018-08-21 DIAGNOSIS — Z9889 Other specified postprocedural states: Secondary | ICD-10-CM

## 2018-08-21 DIAGNOSIS — G459 Transient cerebral ischemic attack, unspecified: Secondary | ICD-10-CM

## 2018-08-21 DIAGNOSIS — Z5181 Encounter for therapeutic drug level monitoring: Secondary | ICD-10-CM | POA: Diagnosis not present

## 2018-08-21 LAB — POCT INR: INR: 2.6 (ref 2.0–3.0)

## 2018-09-04 ENCOUNTER — Encounter: Payer: Self-pay | Admitting: Family Medicine

## 2018-09-05 ENCOUNTER — Ambulatory Visit (INDEPENDENT_AMBULATORY_CARE_PROVIDER_SITE_OTHER): Payer: Medicare Other | Admitting: Family Medicine

## 2018-09-05 ENCOUNTER — Encounter: Payer: Self-pay | Admitting: Family Medicine

## 2018-09-05 ENCOUNTER — Other Ambulatory Visit: Payer: Self-pay

## 2018-09-05 DIAGNOSIS — W19XXXA Unspecified fall, initial encounter: Secondary | ICD-10-CM

## 2018-09-05 DIAGNOSIS — F513 Sleepwalking [somnambulism]: Secondary | ICD-10-CM | POA: Diagnosis not present

## 2018-09-05 DIAGNOSIS — S20212A Contusion of left front wall of thorax, initial encounter: Secondary | ICD-10-CM | POA: Diagnosis not present

## 2018-09-05 DIAGNOSIS — M79601 Pain in right arm: Secondary | ICD-10-CM

## 2018-09-05 NOTE — Progress Notes (Signed)
Virtual Visit via Video Note  I connected with Toron A. Conti on 09/05/18 at 10:30 AM EDT by a video enabled telemedicine application and verified that I am speaking with the correct person using two identifiers.  Location patient: home Location provider:work or home office Persons participating in the virtual visit: patient, provider  I discussed the limitations of evaluation and management by telemedicine and the availability of in person appointments. The patient expressed understanding and agreed to proceed.   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   Pt fell after sleeping walking again 2 days ago.  He fell backwards onto the floor hitting his back and head.  Pt having pain in R bicep and pain in rib of L side.  Having to sit up to sleep as laying flat hurts.  Deep breathing hurts.  Denies deformities of arm or rib cage, bruising, HA.  Taking tylenol 1000 mg q 8 which has been helping.   Pt trying to make changes to his bedroom to prevent injury at night.  Pt plans to keep a light on in the room and move objects.   Pt is on ASA and coumadin for h/o MV replacement, AV repair, afib,   ROS: See pertinent positives and negatives per HPI.  Past Medical History:  Diagnosis Date  . Atrial arrhythmia    status post ablation in [place] with complication including damage to the aortic valve  . Atrial fibrillation ([place])   . AV block, 1st degree   . Bradycardia   . Cluster headache ~ 1980   "went away after I started taking one of the heart RXs" (06/29/2017)  . Diastolic CHF, chronic ([place])   . Diverticulosis   . History of gout   . HTN (hypertension)   . Hypertrophic cardiomyopathy ([place])    s/p myoectomy  . Infection   . Internal hemorrhoids   . Morbid obesity ([place])   . Morbid obesity with BMI of 45.0-49.9, adult ([place])   . Pacemaker  -SJM   .  Skin cancer    "burned off RUE X 1; cut off nose X 2" (06/29/2017)  . TIA (transient ischemic attack) 2001  . Tubulovillous adenoma     Past Surgical History:  Procedure Laterality Date  . 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 BIV PACEMAKER INSERTION CRT-P N/A 07/27/2017   Procedure: BIV PACEMAKER INSERTION CRT-P;  Surgeon: [name], MD;  Location: [facility];  Service: Cardiovascular;  Laterality: N/A;  . BIV UPGRADE N/A 05/28/2017   Procedure: BIV [place];  Surgeon: [name], MD;  Location: [facility];  Service: Cardiovascular;  Laterality: N/A;  . CARDIAC CATHETERIZATION     "couple times" (06/29/2017)  . CARDIAC VALVE REPLACEMENT    . COLONOSCOPY    . COLONOSCOPY WITH PROPOFOL N/A 04/13/2016   Procedure: COLONOSCOPY WITH PROPOFOL;  Surgeon: Gatha Mayer, MD;  [facility];  Service: Endoscopy;  Laterality: N/A;  . INSERT / REPLACE / REMOVE PACEMAKER  ~ [address] Jude Accent   . 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 Jude  . PACEMAKER LEAD REMOVAL N/A 06/29/2017   Procedure: [id number]  REMOVAL AND Four LEAD REMOVAL;  Surgeon: Evans Lance, MD;  Location: [facility];  Service: Cardiovascular;  Laterality: N/A;  .  PACEMAKER REMOVAL  06/29/2017   GERNERATOR  REMOVAL AND Four LEAD REMOVAL; put in external pacemaker (06/29/2017)  . septal myectomy  07/22/1997  . SKIN CANCER EXCISION      Family History  Problem Relation Age of Onset  . Heart disease Sister   . Uterine cancer Sister   . Obesity Sister        500+ lbs  . Heart disease Brother   . Heart disease Sister   . Heart disease Father   . Colon cancer Father 63  . Crohn's disease Paternal Grandmother     SOCIAL HX: Pt plans to head to [facility] [place] for the summer in a few wks.  He and his wife [name] are planning to build a garage and speak with an Arboriculturist about building their home.    Current Outpatient  Medications:  .  acetaminophen (TYLENOL) 325 MG tablet, Take 2 tablets (650 mg total) by mouth every 6 (six) hours as needed for mild pain., Disp: , Rfl:  .  Ascorbic Acid (VITAMIN C) 1000 MG tablet, Take 2,000 mg by mouth daily.  , Disp: , Rfl:  .  aspirin 81 MG tablet, Take 81 mg by mouth daily., Disp: , Rfl:  .  COUMADIN 10 MG tablet, TAKE AS DIRECTED BY  COUMADIN  CLINIC, Disp: 120 tablet, Rfl: 0 .  diphenhydramine-acetaminophen (TYLENOL PM) 25-500 MG TABS tablet, Take 2 tablets by mouth at bedtime as needed (for sleep)., Disp: , Rfl:  .  fish oil-omega-3 fatty acids 1000 MG capsule, Take 4 g by mouth every morning. , Disp: , Rfl:  .  KLOR-CON 10 10 MEQ tablet, Take 9 tablets (90 mEq total) by mouth daily., Disp: 270 tablet, Rfl: 11 .  losartan (COZAAR) 100 MG tablet, TAKE 1 TABLET BY MOUTH ONCE DAILY, Disp: 90 tablet, Rfl: 2 .  Magnesium Oxide 250 MG TABS, Take 1 tablet (250 mg total) by mouth 3 (three) times daily., Disp: 270 tablet, Rfl: 3 .  Multiple Vitamin (MULTIVITAMIN) tablet, Take 1 tablet by mouth daily.  , Disp: , Rfl:  .  potassium chloride (K-DUR,KLOR-CON) 10 MEQ tablet, Take 9 tablets (90 meq) by mouth once daily, Disp: , Rfl:  .  spironolactone (ALDACTONE) 25 MG tablet, TAKE 1 TABLET BY MOUTH ONCE DAILY, Disp: 90 tablet, Rfl: 2 .  torsemide (DEMADEX) 20 MG tablet, TAKE 2 TABLETS BY MOUTH TWICE DAILY, Disp: 360 tablet, Rfl: 2  EXAM:  VITALS per patient if applicable: pO2  58-85%, RR between 12-20 bpm  GENERAL: alert, oriented, appears well and in no acute distress.  Pt is not wearing a shirt.  HEENT: atraumatic, conjunctiva clear, no obvious abnormalities on inspection of external nose and ears  NECK: normal movements of the head and neck  LUNGS: on inspection no signs of respiratory distress, breathing rate appears normal, no obvious gross SOB, gasping or wheezing.  No deformities of chest wall  CV: no obvious cyanosis  MS: R lateral bicep with mild edema, LROM  Abduction~90 degrees in R arm at shoulder 2/2 pain.  moves all visible extremities without noticeable abnormality  PSYCH/NEURO: pleasant and cooperative, no obvious depression or anxiety, speech and thought processing grossly intact  ASSESSMENT AND PLAN:  Discussed the following assessment and plan:  Fall from standing, initial encounter -fall during sleep walking episode.   -neurologically intact -Pt is at increased bleeding risk given use of ASA and coumadin for cardiac conditions.  Sleep walking -Discussed moving furniture that pt could trip/fall on.  Pt considering lowering his bed. -continue CPAP for OSA.  Contusion of rib on left side, initial encounter -discussed imaging.  Pt declines at this time. -given precautions.  Pt to monitor for new or increased edema/ecchymosis as on chronic anticoagulation -advised to take in deep breaths regularly to prevent atelectasis and possible pneumonia. -supportive care ok such as Tylenol, heat, massage, ice.  Right arm pain -concern given pt's limited ROM.  Xray encouraged, but pt declines at this time.  Pt will notify clinic if pain continues/becomes worse. -pt given ED precautions  f/u in the next few days.     I discussed the assessment and treatment plan with the patient. The patient was provided an opportunity to ask questions and all were answered. The patient agreed with the plan and demonstrated an understanding of the instructions.   The patient was advised to call back or seek an in-person evaluation if the symptoms worsen or if the condition fails to improve as anticipated.    [name], MD

## 2018-09-06 ENCOUNTER — Encounter: Payer: Self-pay | Admitting: Family Medicine

## 2018-09-09 IMAGING — DX DG CHEST 2V
2 series · 2 of 2 positions shown · non-contrast
Comparison: Chest radiograph performed 06/30/2017

CLINICAL DATA: Acute onset of generalized chest pain and left
shoulder pain. Failure of external pacemaker this morning.

EXAM:
CHEST - 2 VIEW

[chest pa]
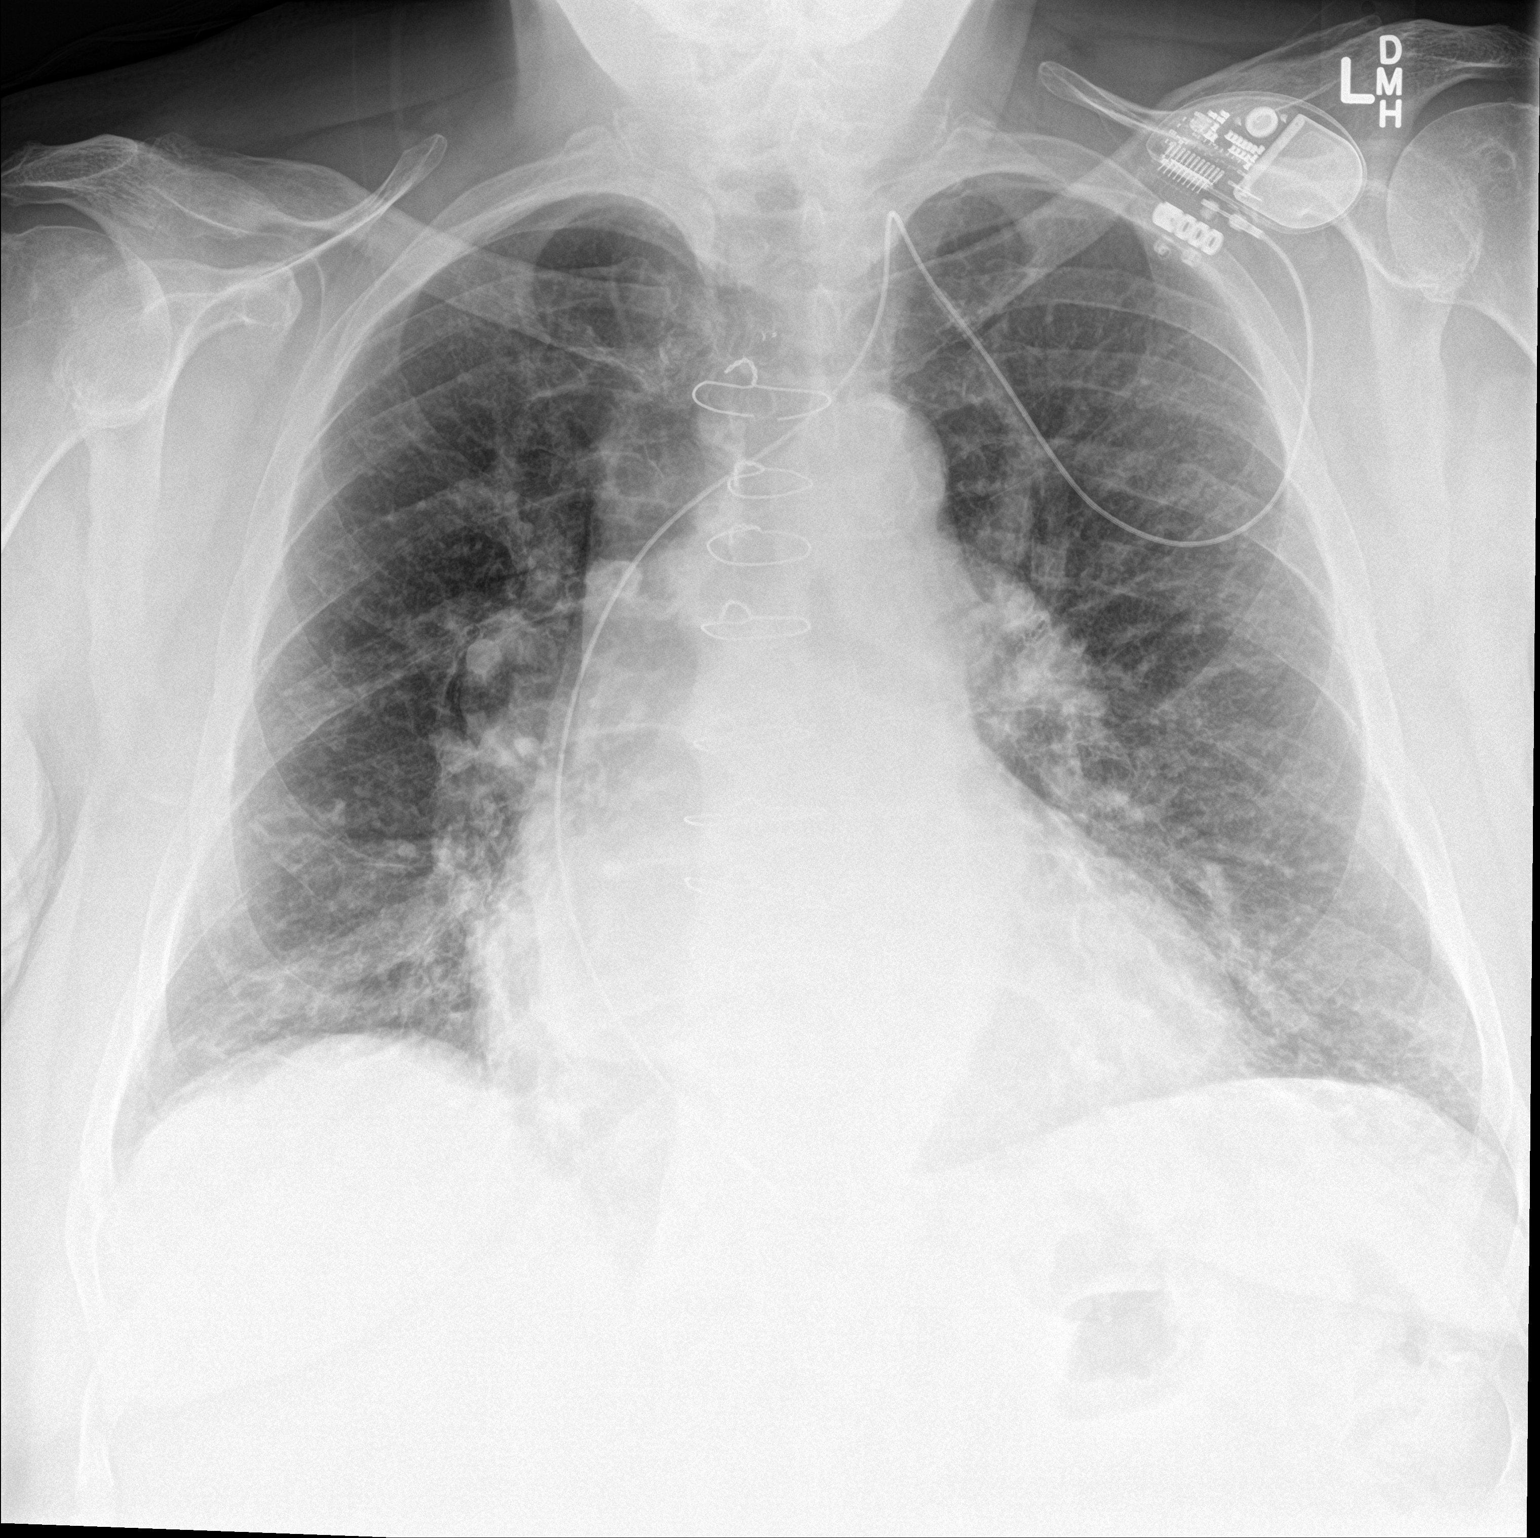

[chest lat]
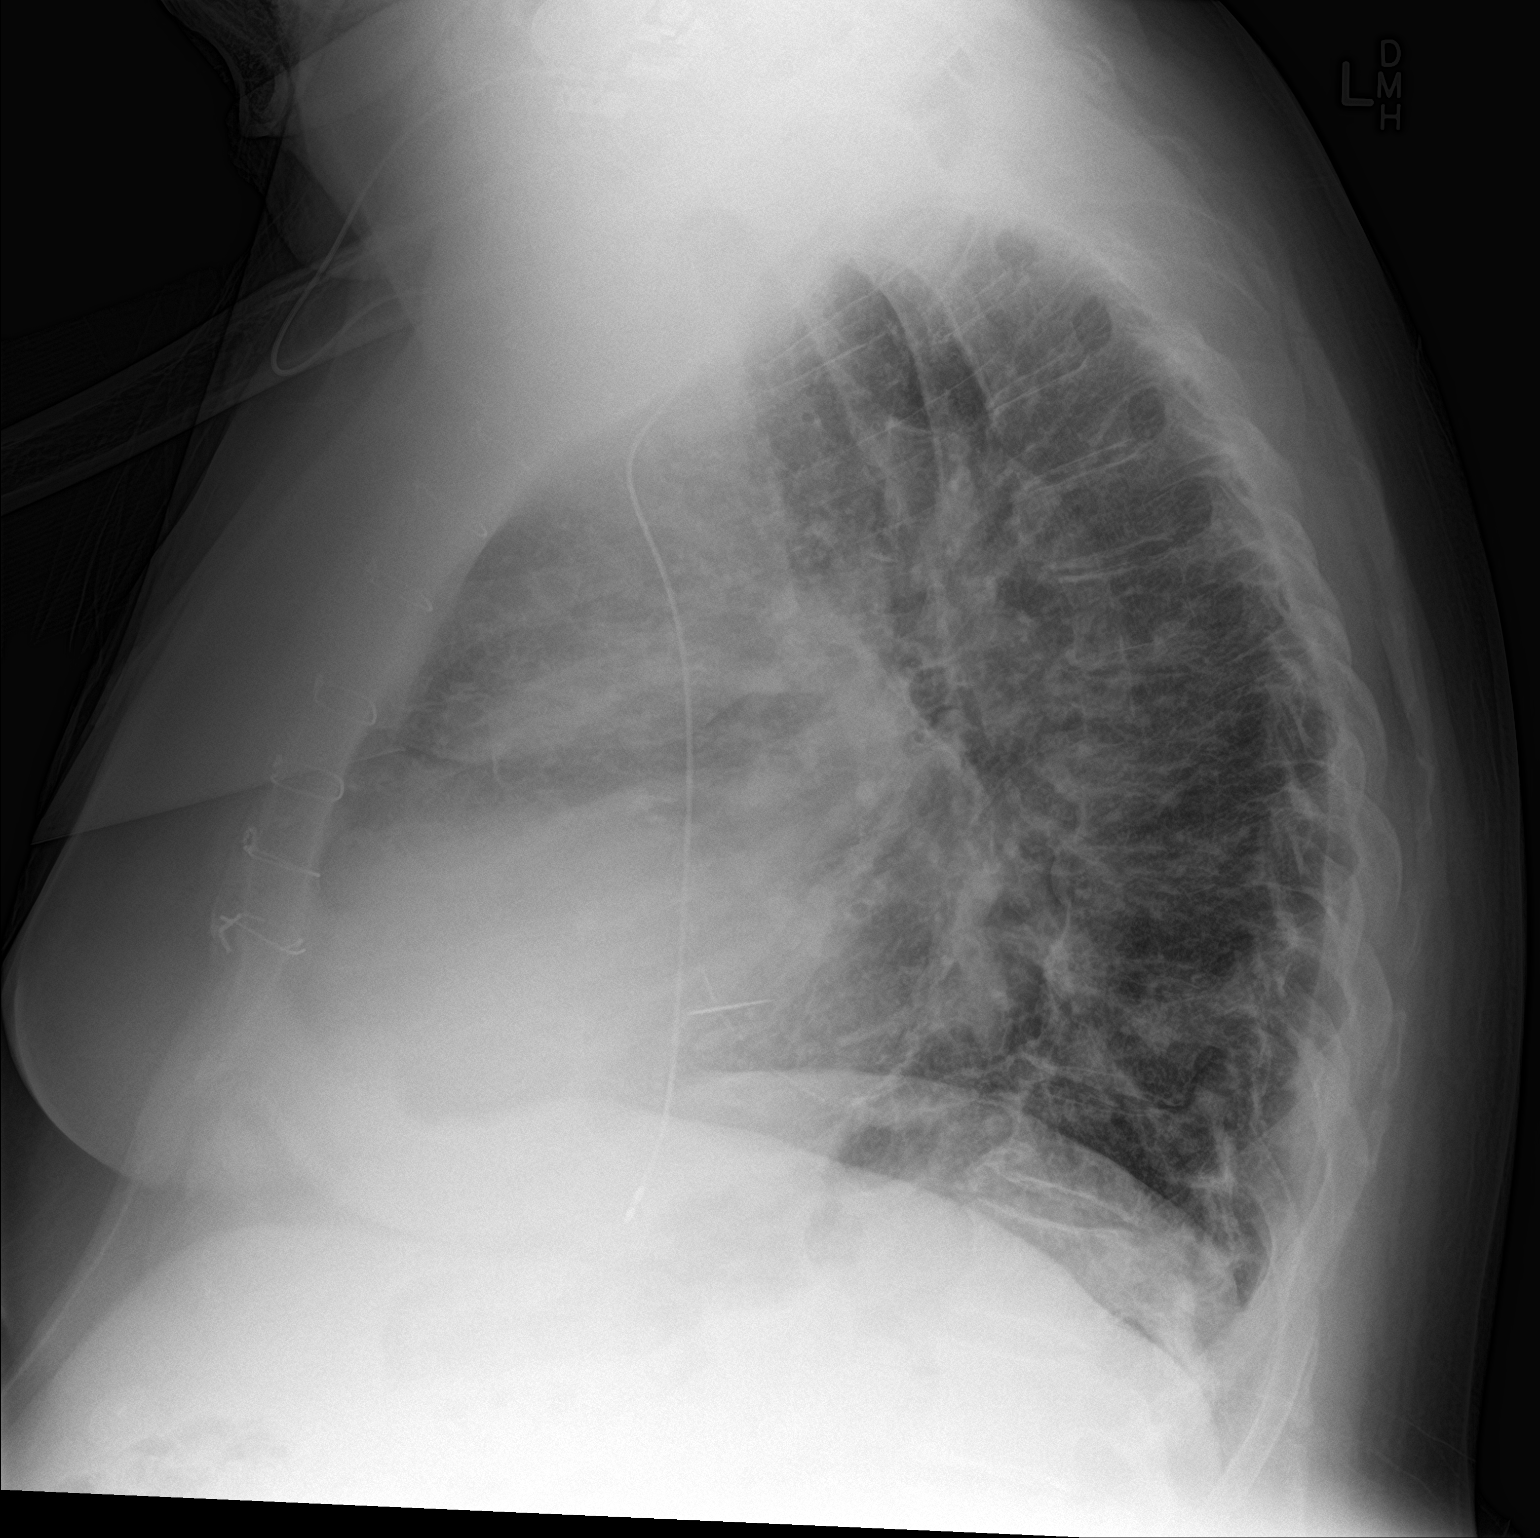

[2 of 2 positions shown; findings below may reference images not displayed]

FINDINGS: The patient's pacemaker lead has retracted approximately 8 cm since
the prior study, likely ending about the right atrium or tricuspid
valve. The external pacemaker is again noted about the left chest
wall.

The lungs are well-aerated. Bibasilar airspace opacities may reflect
mild interstitial edema. Vascular congestion is noted. Peribronchial
thickening is seen. There is no evidence of pleural effusion or
pneumothorax.

The heart is mildly enlarged. The patient is status post median
sternotomy. There is a chronic fracture of the superiormost sternal
wire. No acute osseous abnormalities are seen.
IMPRESSION: 1. Pacemaker lead has retracted approximately 8 cm since the prior
study, likely ending about the right atrium or tricuspid valve.
2. Bibasilar airspace opacities may reflect mild interstitial edema.
Peribronchial thickening seen.
3. Vascular congestion and mild cardiomegaly.

These results were called by telephone at the time of interpretation
on 07/27/2017 at [DATE] to Dr. Yueqi Jakubowski, who verbally
acknowledged these results.

## 2018-10-09 ENCOUNTER — Telehealth: Payer: Self-pay

## 2018-10-09 NOTE — Telephone Encounter (Signed)
lmom for prescreen  

## 2018-10-28 ENCOUNTER — Telehealth: Payer: Self-pay | Admitting: *Deleted

## 2018-10-28 NOTE — Telephone Encounter (Signed)
Pt's wife called. Pt is overdue for an INR check. Spoke with pt and his wife, they stated that the pt is out of town and found a lab ([facility]) to get his INR checked while pt is out of town. Order faxed to [phone] so pt can get his INR checked, per pt and his wife's request.  Pt stated he will be getting his INR checked on 7/22 or 7/23. Told pt to call Coumadin Clinic if he has any questions [phone].

## 2018-10-30 DIAGNOSIS — I4891 Unspecified atrial fibrillation: Secondary | ICD-10-CM | POA: Diagnosis not present

## 2018-10-30 DIAGNOSIS — Z5181 Encounter for therapeutic drug level monitoring: Secondary | ICD-10-CM | POA: Diagnosis not present

## 2018-10-30 LAB — POCT INR: INR: 3.5 — AB (ref 2.0–3.0)

## 2018-10-31 ENCOUNTER — Ambulatory Visit (INDEPENDENT_AMBULATORY_CARE_PROVIDER_SITE_OTHER): Payer: Medicare Other | Admitting: Cardiovascular Disease

## 2018-10-31 DIAGNOSIS — Z5181 Encounter for therapeutic drug level monitoring: Secondary | ICD-10-CM | POA: Diagnosis not present

## 2018-10-31 DIAGNOSIS — G459 Transient cerebral ischemic attack, unspecified: Secondary | ICD-10-CM

## 2018-10-31 DIAGNOSIS — Z9889 Other specified postprocedural states: Secondary | ICD-10-CM

## 2018-10-31 DIAGNOSIS — I4891 Unspecified atrial fibrillation: Secondary | ICD-10-CM | POA: Diagnosis not present

## 2018-10-31 NOTE — Patient Instructions (Addendum)
Description   Spoke with pt and instructed pt to continue on same dosage 10mg  daily except 15mg  on Sundays and Wednesdays. Recheck in 8 weeks in [place]. Call with any concerns any new medications or if scheduled for any procedures  [phone]

## 2018-11-07 ENCOUNTER — Encounter: Payer: Medicare Other | Admitting: *Deleted

## 2018-11-08 ENCOUNTER — Telehealth: Payer: Self-pay

## 2018-11-08 NOTE — Telephone Encounter (Signed)
Spoke with patient to remind of missed remote transmission 

## 2018-11-15 ENCOUNTER — Encounter: Payer: Self-pay | Admitting: Cardiology

## 2018-11-16 ENCOUNTER — Other Ambulatory Visit: Payer: Self-pay | Admitting: Internal Medicine

## 2018-11-16 ENCOUNTER — Other Ambulatory Visit: Payer: Self-pay | Admitting: Physician Assistant

## 2018-11-22 ENCOUNTER — Telehealth: Payer: Self-pay | Admitting: Pharmacist

## 2018-11-22 MED ORDER — JANTOVEN 10 MG PO TABS: 10.0000 mg | ORAL_TABLET | Freq: Once | ORAL | 0 refills | Status: DC

## 2018-11-22 NOTE — Telephone Encounter (Signed)
Patient would prefer to go with Jantoven. rx sent to [facility]

## 2018-11-22 NOTE — Addendum Note (Signed)
Addended by: Marcelle Overlie D on: 11/22/2018 04:43 PM   Modules accepted: Orders

## 2018-11-22 NOTE — Telephone Encounter (Signed)
Patient informed that brand coumadin is no longer being manufactured. Claims generic warfarin caused him to have a TIA 20 year ago. Advised we could switch to [place] another branded version or try generic warfarin. Patient would like to get some advice from a friend and call us back

## 2018-12-02 ENCOUNTER — Other Ambulatory Visit: Payer: Self-pay

## 2018-12-02 ENCOUNTER — Encounter: Payer: Self-pay | Admitting: Internal Medicine

## 2018-12-02 ENCOUNTER — Ambulatory Visit (INDEPENDENT_AMBULATORY_CARE_PROVIDER_SITE_OTHER): Payer: Medicare Other | Admitting: Internal Medicine

## 2018-12-02 VITALS — BP 124/78 | HR 61 | Ht 71.0 in | Wt 349.8 lb

## 2018-12-02 DIAGNOSIS — I442 Atrioventricular block, complete: Secondary | ICD-10-CM | POA: Diagnosis not present

## 2018-12-02 DIAGNOSIS — I428 Other cardiomyopathies: Secondary | ICD-10-CM

## 2018-12-02 DIAGNOSIS — Z950 Presence of cardiac pacemaker: Secondary | ICD-10-CM

## 2018-12-02 LAB — CUP PACEART INCLINIC DEVICE CHECK
Battery Remaining Longevity: 68 mo
Battery Voltage: 2.98 V
Brady Statistic RA Percent Paced: 96 %
Brady Statistic RV Percent Paced: 99 %
Date Time Interrogation Session: 20200824113158
Implantable Lead Implant Date: 20190419
Implantable Lead Implant Date: 20190419
Implantable Lead Implant Date: 20190419
Implantable Lead Location: 753858
Implantable Lead Location: 753859
Implantable Lead Location: 753860
Implantable Lead Model: 5076
Implantable Lead Model: 5076
Implantable Pulse Generator Implant Date: 20190419
Lead Channel Impedance Value: 375 Ohm
Lead Channel Impedance Value: 387.5 Ohm
Lead Channel Impedance Value: 412.5 Ohm
Lead Channel Pacing Threshold Amplitude: 0.5 V
Lead Channel Pacing Threshold Amplitude: 0.5 V
Lead Channel Pacing Threshold Amplitude: 0.625 V
Lead Channel Pacing Threshold Amplitude: 1 V
Lead Channel Pacing Threshold Amplitude: 1 V
Lead Channel Pacing Threshold Pulse Width: 0.5 ms
Lead Channel Pacing Threshold Pulse Width: 0.5 ms
Lead Channel Pacing Threshold Pulse Width: 0.5 ms
Lead Channel Pacing Threshold Pulse Width: 0.5 ms
Lead Channel Pacing Threshold Pulse Width: 1 ms
Lead Channel Sensing Intrinsic Amplitude: 0.4 mV
Lead Channel Sensing Intrinsic Amplitude: 9.3 mV
Lead Channel Setting Pacing Amplitude: 2 V
Lead Channel Setting Pacing Amplitude: 2 V
Lead Channel Setting Pacing Amplitude: 2.5 V
Lead Channel Setting Pacing Pulse Width: 0.5 ms
Lead Channel Setting Pacing Pulse Width: 1 ms
Lead Channel Setting Sensing Sensitivity: 2 mV
Pulse Gen Model: 3262
Pulse Gen Serial Number: 9008098

## 2018-12-02 LAB — MAGNESIUM: Magnesium: 2.6 mg/dL — ABNORMAL HIGH (ref 1.6–2.3)

## 2018-12-02 LAB — BASIC METABOLIC PANEL
BUN/Creatinine Ratio: 17 (ref 10–24)
BUN: 22 mg/dL (ref 8–27)
CO2: 24 mmol/L (ref 20–29)
Calcium: 9.9 mg/dL (ref 8.6–10.2)
Chloride: 97 mmol/L (ref 96–106)
Creatinine, Ser: 1.29 mg/dL — ABNORMAL HIGH (ref 0.76–1.27)
GFR calc Af Amer: 65 mL/min/{1.73_m2} (ref >59)
GFR calc non Af Amer: 56 mL/min/{1.73_m2} — ABNORMAL LOW (ref >59)
Glucose: 90 mg/dL (ref 65–99)
Potassium: 4.7 mmol/L (ref 3.5–5.2)
Sodium: 136 mmol/L (ref 134–144)

## 2018-12-02 MED ORDER — POTASSIUM CHLORIDE CRYS ER 10 MEQ PO TBCR: 130.0000 meq | EXTENDED_RELEASE_TABLET | Freq: Every day | ORAL | 3 refills | Status: DC

## 2018-12-02 MED ORDER — METOLAZONE 5 MG PO TABS: 5.0000 mg | ORAL_TABLET | ORAL | 3 refills | Status: DC

## 2018-12-02 NOTE — Progress Notes (Signed)
Patient Care Team: [name], MD as PCP - General (Family Medicine) [name], MD as Consulting Physician (Cardiology)   HPI  Frank [name] is a 73 y.o. male Seen in followup for hypertrophic cardiomyopathy,  status post myectomy with mechanical mitral valve replacement and aortic valve repair. 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 His device reached ERI.  With noise on his RV lead, he underwent CRT upgrade.  This was complicated by hematoma some erythema.  It became infected and required explantation.  He had a temporary permanent pacemaker implanted.  This dislodged and he underwent acute CRT implantation on the right side.  He then had problems with poor healing.  4 weeks, we were packing.  Dr. Lovena Le weighed in and started with exuberant washing.  Both wounds healed over the subsequent couple of weeks.  Over last months has noted more fatigue, somewhat worsening dyspnea and chronic edema which he thinks might be getting worse  Increased his diuretic on his own and Mg and K repletion 2/2 cramps  No chest pain  Continues to work on building the retirement home in the [place].  His wife he reminds me is a number of years younger than he and is still working teaching in [employer]  DATE TEST EF   4/17 Echo    55-60 % Severe LVH (18/17). [employer]  PA press 62 RV normal  10/18 Echo  45-50% Some segmental WMA Severe RVE, depressed EF LAE (63/2.1/33)  7/19 Echo  65% Severe LAE     Date Cr K Hgb  10/18 1.02  12.4   4/19  1.18 4.1 11.1  1/20   12.6  8/20 1.29 4.7      Past Medical History:  Diagnosis Date  . Atrial arrhythmia    status post ablation in [place] with complication including damage to the aortic valve  . Atrial fibrillation ([place])   . AV block, 1st degree   . Bradycardia   . Cluster headache ~ 1980   "went away after I started taking one of the heart RXs" (06/29/2017)  . Diastolic  CHF, chronic ([place])   . Diverticulosis   . History of gout   . HTN (hypertension)   . Hypertrophic cardiomyopathy ([place])    s/p myoectomy  . Infection   . Internal hemorrhoids   . Morbid obesity ([place])   . Morbid obesity with BMI of 45.0-49.9, adult ([place])   . Pacemaker  -SJM   . Skin cancer    "burned off RUE X 1; cut off nose X 2" (06/29/2017)  . TIA (transient ischemic attack) 2001  . Tubulovillous adenoma     Past Surgical History:  Procedure Laterality Date  . 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 BIV PACEMAKER INSERTION CRT-P N/A 07/27/2017   Procedure: BIV PACEMAKER INSERTION CRT-P;  Surgeon: [name], MD;  Location: [facility];  Service: Cardiovascular;  Laterality: N/A;  . BIV UPGRADE N/A 05/28/2017   Procedure: BIV [place];  Surgeon: [name], MD;  Location: [facility];  Service: Cardiovascular;  Laterality: N/A;  . CARDIAC CATHETERIZATION     "couple times" (06/29/2017)  . CARDIAC VALVE REPLACEMENT    . COLONOSCOPY    . COLONOSCOPY WITH PROPOFOL N/A 04/13/2016   Procedure: COLONOSCOPY WITH PROPOFOL;  Surgeon: Gatha Mayer, MD;  [facility];  Service:  Endoscopy;  Laterality: N/A;  . INSERT / REPLACE / REMOVE PACEMAKER  ~ [address] Jude Accent   . 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 Jude  . PACEMAKER LEAD REMOVAL N/A 06/29/2017   Procedure: [id number]  REMOVAL AND Four LEAD REMOVAL;  Surgeon: Evans Lance, MD;  Location: [facility];  Service: Cardiovascular;  Laterality: N/A;  . PACEMAKER REMOVAL  06/29/2017   GERNERATOR  REMOVAL AND Four LEAD REMOVAL; put in external pacemaker (06/29/2017)  . septal myectomy  07/22/1997  . SKIN CANCER EXCISION      Current Outpatient Medications  Medication Sig Dispense Refill  . acetaminophen (TYLENOL) 325 MG tablet Take 2 tablets (650 mg total) by mouth every 6 (six) hours as needed for mild pain.    . Ascorbic  Acid (VITAMIN C) 1000 MG tablet Take 2,000 mg by mouth daily.      Marland Kitchen aspirin 81 MG tablet Take 81 mg by mouth daily.    . diphenhydramine-acetaminophen (TYLENOL PM) 25-500 MG TABS tablet Take 2 tablets by mouth at bedtime as needed (for sleep).    . fish oil-omega-3 fatty acids 1000 MG capsule Take 4 g by mouth every morning.     Marland Kitchen JANTOVEN 10 MG tablet Take 1 tablet (10 mg total) by mouth one time only at 6 PM. 120 tablet 0  . KLOR-CON 10 10 MEQ tablet Take 9 tablets (90 mEq total) by mouth daily. 270 tablet 11  . losartan (COZAAR) 100 MG tablet Take 1 tablet by mouth once daily 90 tablet 0  . Magnesium 250 MG TABS Take 2 tablets by mouth 3 (three) times daily.    . Multiple Vitamin (MULTIVITAMIN) tablet Take 1 tablet by mouth daily.      . potassium chloride (K-DUR) 10 MEQ tablet Take 130 mEq by mouth daily. Patient takes 13 tablets throughout the day    . spironolactone (ALDACTONE) 25 MG tablet Take 1 tablet by mouth once daily 90 tablet 0  . torsemide (DEMADEX) 20 MG tablet Take 60 mg by mouth 2 (two) times daily.     No current facility-administered medications for this visit.     Allergies  Allergen Reactions  . Warfarin And Related Other (See Comments)    Per the patient's wife, "Warfain made him have a TIA" CAN TAKE BRAND NAME COUMADIN   . Lovenox [Enoxaparin Sodium] Other (See Comments)    Causes large lumps at injection site    Review of Systems negative except from HPI and PMH  Physical Exam BP 124/78   Pulse 61   Ht 5\' 11"  (1.803 m)   Wt (!) 349 lb 12.8 oz (158.7 kg)   SpO2 93%   BMI 48.79 kg/m   Well developed and Morbidly obese in no acute distress HENT normal Neck supple    Clear Device pocket well healed; without hematoma or erythema.  There is no tethering  Regular rate and rhythm, mechanical S 1 2/6 murmur Abd-soft with active BS No Clubbing cyanosis 4+ edema Skin-warm and dry A & Oriented  Grossly normal sensory and motor function  ECG Apacing With  BiV pacing  Upright QRS Lead 1 and inverted Lead 1    Assessment and  Plan  Atrial fibrillation-paroxysmal  Mitral valve replacement-mechanical  Hypertrophic cardiomyopathy status post myectomy  Cardiomyopathy-pacer induced-resolved  HFpEF  Complete heart block  VT  Nonsustained    Scant Afib  I am surprised but glad that he is maintaining sinus  Volume  overloaded with worsening SOB he Seery send a text to Hank he Seery send a text to Hank Will add metolazone once per week  HIs K and Mg repletion requirements are legion, will increase K on metolazone   Will check blood work    No intercurrent Ventricular tachycardia

## 2018-12-02 NOTE — Patient Instructions (Signed)
Medication Instructions:  Your physician has recommended you make the following change in your medication:   1. Begin Metolazone, 5mg  tablet, once weekly 2. Increase your potassium supplement to 158mEq on the day you take your Metolazone.  Labwork: You will have labs drawn today: BMP and Mg   Testing/Procedures: None ordered.  Follow-Up: Your physician recommends that you schedule a follow-up appointment in:   6 months with Dr. [name]  Any Other Special Instructions Will Be Listed Below (If Applicable).     If you need a refill on your cardiac medications before your next appointment, please call your pharmacy.

## 2018-12-03 ENCOUNTER — Ambulatory Visit (INDEPENDENT_AMBULATORY_CARE_PROVIDER_SITE_OTHER): Payer: Medicare Other | Admitting: *Deleted

## 2018-12-03 DIAGNOSIS — I428 Other cardiomyopathies: Secondary | ICD-10-CM

## 2018-12-03 DIAGNOSIS — I442 Atrioventricular block, complete: Secondary | ICD-10-CM

## 2018-12-03 LAB — CUP PACEART REMOTE DEVICE CHECK
Battery Remaining Longevity: 74 mo
Battery Remaining Percentage: 95.5 %
Battery Voltage: 2.98 V
Brady Statistic AP VP Percent: 99 %
Brady Statistic AP VS Percent: 1 %
Brady Statistic AS VP Percent: 1 %
Brady Statistic AS VS Percent: 0 %
Brady Statistic RA Percent Paced: 99 %
Date Time Interrogation Session: 20200825012735
Implantable Lead Implant Date: 20190419
Implantable Lead Implant Date: 20190419
Implantable Lead Implant Date: 20190419
Implantable Lead Location: 753858
Implantable Lead Location: 753859
Implantable Lead Location: 753860
Implantable Lead Model: 5076
Implantable Lead Model: 5076
Implantable Pulse Generator Implant Date: 20190419
Lead Channel Impedance Value: 360 Ohm
Lead Channel Impedance Value: 380 Ohm
Lead Channel Impedance Value: 410 Ohm
Lead Channel Pacing Threshold Amplitude: 0.5 V
Lead Channel Pacing Threshold Amplitude: 0.625 V
Lead Channel Pacing Threshold Amplitude: 1 V
Lead Channel Pacing Threshold Pulse Width: 0.5 ms
Lead Channel Pacing Threshold Pulse Width: 0.5 ms
Lead Channel Pacing Threshold Pulse Width: 1 ms
Lead Channel Sensing Intrinsic Amplitude: 0.4 mV
Lead Channel Sensing Intrinsic Amplitude: 9.3 mV
Lead Channel Setting Pacing Amplitude: 2 V
Lead Channel Setting Pacing Amplitude: 2 V
Lead Channel Setting Pacing Amplitude: 2.5 V
Lead Channel Setting Pacing Pulse Width: 0.5 ms
Lead Channel Setting Pacing Pulse Width: 1 ms
Lead Channel Setting Sensing Sensitivity: 2 mV
Pulse Gen Model: 3262
Pulse Gen Serial Number: 9008098

## 2018-12-04 ENCOUNTER — Telehealth: Payer: Self-pay | Admitting: Internal Medicine

## 2018-12-04 NOTE — Telephone Encounter (Signed)
Notes recorded by [name], MD on 12/04/2018 at 8:27 AM EDT  Please Inform Patient that labs show Cr a little up, prob ok, and Mg also a little high  Lets have him decrease his mg to twice daily from threeThanks

## 2018-12-04 NOTE — Telephone Encounter (Signed)
I spoke with the patient regarding his lab results. He voices understanding to decrease his magnesium down from TID to BID.

## 2018-12-10 NOTE — Progress Notes (Signed)
Remote pacemaker transmission.   

## 2018-12-20 ENCOUNTER — Other Ambulatory Visit: Payer: Self-pay

## 2018-12-20 MED ORDER — JANTOVEN 10 MG PO TABS: ORAL_TABLET | ORAL | 1 refills | Status: DC

## 2018-12-24 ENCOUNTER — Telehealth: Payer: Self-pay | Admitting: Internal Medicine

## 2018-12-24 ENCOUNTER — Other Ambulatory Visit: Payer: Self-pay

## 2018-12-24 DIAGNOSIS — E876 Hypokalemia: Secondary | ICD-10-CM

## 2018-12-24 NOTE — Telephone Encounter (Signed)
Attempted to contact pt via phone; unable to leave voicemail. Sent pt a Therapist, music.

## 2018-12-24 NOTE — Telephone Encounter (Signed)
Lab orders to be faxed to [phone].

## 2018-12-24 NOTE — Telephone Encounter (Signed)
Pt c/o medication issue:  1. Name of Medication: metolazone (ZAROXOLYN) 5 MG tablet  2. How are you currently taking this medication (dosage and times per day)? 5mg  once weekly  3. Are you having a reaction (difficulty breathing--STAT)? no  4. What is your medication issue? Pt is worried about his electrolytes  Patient is in [place]. He will be driving to a clinic tomorrow or Thursday to get his Coumadin checked. He already has a standing order in with the coumadin clinic to get his coumadin checked at this outside hospital.  He wanted to know if Dr. [name] would put in an order to have the patient have labs drawn to monitor his electrolytes. He has lost 16 pounds of fluid in the past two days. He feels that the new medication is working well, but he has been reading about the side effects. One of them is electrolyte imbalance. Please discuss with the patient what he should do.

## 2018-12-26 DIAGNOSIS — Z5181 Encounter for therapeutic drug level monitoring: Secondary | ICD-10-CM | POA: Diagnosis not present

## 2018-12-26 DIAGNOSIS — I4891 Unspecified atrial fibrillation: Secondary | ICD-10-CM | POA: Diagnosis not present

## 2018-12-26 LAB — PROTIME-INR: INR: 3 — AB (ref 0.9–1.1)

## 2019-01-01 ENCOUNTER — Telehealth: Payer: Self-pay | Admitting: *Deleted

## 2019-01-01 ENCOUNTER — Ambulatory Visit (INDEPENDENT_AMBULATORY_CARE_PROVIDER_SITE_OTHER): Payer: Medicare Other | Admitting: Internal Medicine

## 2019-01-01 DIAGNOSIS — G459 Transient cerebral ischemic attack, unspecified: Secondary | ICD-10-CM | POA: Diagnosis not present

## 2019-01-01 DIAGNOSIS — Z9889 Other specified postprocedural states: Secondary | ICD-10-CM

## 2019-01-01 DIAGNOSIS — Z5181 Encounter for therapeutic drug level monitoring: Secondary | ICD-10-CM

## 2019-01-01 DIAGNOSIS — I4891 Unspecified atrial fibrillation: Secondary | ICD-10-CM

## 2019-01-01 NOTE — Patient Instructions (Addendum)
Description   Spoke with pt and instructed pt to continue on same dosage 10mg  daily except 15mg  on Sundays and Wednesdays. Recheck in 6 weeks in the office. Call with any concerns any new medications or if scheduled for any procedures  [phone]

## 2019-01-01 NOTE — Telephone Encounter (Addendum)
Pt is overdue for INR check and was due to have an INR done on 12/26/2018 called [facility] where he states he had it done. Spoke with the Client Services representative and she stated she would fax over the results and confirmed the fax number which was correct. Will await for the fax.   Checked the fax and no results on the patient. I faxed something to our fax machine to ensure there was no problem on our end and received the test fax. Therefore, called the [facility] again. Called back to [facility] and was unable to speak to anyone that could fax over the INR results and explained that the results are needed to ensure dosing in a timely manner from the lab draw and Arbie Cookey went to see if the person in Client Services was available and she was on another call so had to leave a message for her to call back and left the direct number and she repeated it for confirmation. Will await a call back.   Received a call back from [place] at [facility] and she stated she would refax and gave her an alternate fax to our medical records department in addition to ours. She gave me the verbal INR on 3.0 and PT 29.2 done on 12/26/2018; will call pt and dose.   Spoke with pt, please refer to Anticoagulation Encounter for details.

## 2019-01-27 ENCOUNTER — Other Ambulatory Visit: Payer: Self-pay

## 2019-01-27 ENCOUNTER — Telehealth: Payer: Self-pay | Admitting: Internal Medicine

## 2019-01-27 ENCOUNTER — Telehealth (INDEPENDENT_AMBULATORY_CARE_PROVIDER_SITE_OTHER): Payer: Medicare Other | Admitting: Family Medicine

## 2019-01-27 ENCOUNTER — Encounter: Payer: Self-pay | Admitting: Family Medicine

## 2019-01-27 DIAGNOSIS — R05 Cough: Secondary | ICD-10-CM | POA: Diagnosis not present

## 2019-01-27 DIAGNOSIS — Z8719 Personal history of other diseases of the digestive system: Secondary | ICD-10-CM

## 2019-01-27 DIAGNOSIS — R059 Cough, unspecified: Secondary | ICD-10-CM

## 2019-01-27 MED ORDER — OMEPRAZOLE 20 MG PO CPDR: 20.0000 mg | DELAYED_RELEASE_CAPSULE | Freq: Every day | ORAL | 3 refills | Status: DC

## 2019-01-27 NOTE — Progress Notes (Signed)
Virtual Visit via Video Note  I connected with Frank [name] on 01/27/19 at  3:30 PM EDT by a video enabled telemedicine application 2/2 [id number] pandemic and verified that I am speaking with the correct person using two identifiers.  Location patient: home in [place]., [place] Location provider:work or home office Persons participating in the virtual visit: patient, provider  I discussed the limitations of evaluation and management by telemedicine and the availability of in person appointments. The patient expressed understanding and agreed to proceed.   HPI: Pt with a cough x yrs.  Feels like has become worse in the last 3 months.  At times coughing spells take his breath away, pO2 has been stable 94-98%.  Also has hoarse voice.  Notices it becomes worse with milk, yogurt, ice cream, 3 bean side salad, and chocolate.  These foods don't always do it but can.  Every once in a while will get intense CP that is relieved by drinking a carbonated beverage and belching.  States a previous Cardiologist told him that was to be expected after his surgery 2/2 a hiatal hernia.  States had heart burn in his 36s.    ROS: See pertinent positives and negatives per HPI.  Past Medical History:  Diagnosis Date  . Atrial arrhythmia    status post ablation in [place] with complication including damage to the aortic valve  . Atrial fibrillation ([place])   . AV block, 1st degree   . Bradycardia   . Cluster headache ~ 1980   "went away after I started taking one of the heart RXs" (06/29/2017)  . Diastolic CHF, chronic ([place])   . Diverticulosis   . History of gout   . HTN (hypertension)   . Hypertrophic cardiomyopathy ([place])    s/p myoectomy  . Infection   . Internal hemorrhoids   . Morbid obesity ([place])   . Morbid obesity with BMI of 45.0-49.9, adult ([place])   . Pacemaker  -SJM   . Skin cancer    "burned off RUE X 1; cut off nose X 2" (06/29/2017)  . TIA (transient ischemic attack) 2001  . Tubulovillous adenoma      Past Surgical History:  Procedure Laterality Date  . 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 BIV PACEMAKER INSERTION CRT-P N/A 07/27/2017   Procedure: BIV PACEMAKER INSERTION CRT-P;  Surgeon: [name], MD;  Location: [facility];  Service: Cardiovascular;  Laterality: N/A;  . BIV UPGRADE N/A 05/28/2017   Procedure: BIV [place];  Surgeon: [name], MD;  Location: [facility];  Service: Cardiovascular;  Laterality: N/A;  . CARDIAC CATHETERIZATION     "couple times" (06/29/2017)  . CARDIAC VALVE REPLACEMENT    . COLONOSCOPY    . COLONOSCOPY WITH PROPOFOL N/A 04/13/2016   Procedure: COLONOSCOPY WITH PROPOFOL;  Surgeon: Gatha Mayer, MD;  [facility];  Service: Endoscopy;  Laterality: N/A;  . INSERT / REPLACE / REMOVE PACEMAKER  ~ [address]   . 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 [facility]  . PACEMAKER LEAD REMOVAL N/A 06/29/2017   Procedure: [id number]  REMOVAL AND Four LEAD REMOVAL;  Surgeon: Evans Lance, MD;  Location: [facility];  Service: Cardiovascular;  Laterality: N/A;  . PACEMAKER REMOVAL  06/29/2017   GERNERATOR  REMOVAL AND Four LEAD REMOVAL; put in external pacemaker (06/29/2017)  . septal myectomy  07/22/1997  . SKIN CANCER EXCISION  Family History  Problem Relation Age of Onset  . Heart disease Sister   . Uterine cancer Sister   . Obesity Sister        500+ lbs  . Heart disease Brother   . Heart disease Sister   . Heart disease Father   . Colon cancer Father 85  . Crohn's disease Paternal Grandmother       Current Outpatient Medications:  .  acetaminophen (TYLENOL) 325 MG tablet, Take 2 tablets (650 mg total) by mouth every 6 (six) hours as needed for mild pain., Disp: , Rfl:  .  Ascorbic Acid (VITAMIN C) 1000 MG tablet, Take 2,000 mg by mouth daily.  , Disp: , Rfl:  .  aspirin 81 MG tablet, Take 81 mg by mouth daily., Disp: , Rfl:   .  diphenhydramine-acetaminophen (TYLENOL PM) 25-500 MG TABS tablet, Take 2 tablets by mouth at bedtime as needed (for sleep)., Disp: , Rfl:  .  fish oil-omega-3 fatty acids 1000 MG capsule, Take 4 g by mouth every morning. , Disp: , Rfl:  .  JANTOVEN 10 MG tablet, Take as directed by [facility]mad[facility] Clinic, Disp: 120 tablet, Rfl: 1 .  KLOR-CON 10 10 MEQ tablet, Take 9 tablets (90 mEq total) by mouth daily., Disp: 270 tablet, Rfl: 11 .  losartan (COZAAR) 100 MG tablet, Take 1 tablet by mouth once daily, Disp: 90 tablet, Rfl: 0 .  Magnesium 250 MG TABS, Take 2 tablets (500 mg) by mouth twice daily, Disp: , Rfl:  .  metolazone (ZAROXOLYN) 5 MG tablet, Take 1 tablet (5 mg total) by mouth once a week., Disp: 12 tablet, Rfl: 3 .  Multiple Vitamin (MULTIVITAMIN) tablet, Take 1 tablet by mouth daily.  , Disp: , Rfl:  .  potassium chloride (K-DUR) 10 MEQ tablet, Take 13 tablets (130 mEq total) by mouth daily. Patient takes 13 tablets throughout the day Take 157mEq on days you take Metolazone, Disp: 1226 tablet, Rfl: 3 .  spironolactone (ALDACTONE) 25 MG tablet, Take 1 tablet by mouth once daily, Disp: 90 tablet, Rfl: 0 .  torsemide (DEMADEX) 20 MG tablet, Take 60 mg by mouth 2 (two) times daily., Disp: , Rfl:   EXAM:  VITALS per patient if applicable:  RR between 12-20 bpm  GENERAL: alert, oriented, appears well and in no acute distress  HEENT: atraumatic, conjunctiva clear, no obvious abnormalities on inspection of external nose and ears  NECK: normal movements of the head and neck  LUNGS: on inspection no signs of respiratory distress, breathing rate appears normal, no obvious gross SOB, gasping or wheezing  CV: no obvious cyanosis  MS: moves all visible extremities without noticeable abnormality  PSYCH/NEURO: pleasant and cooperative, no obvious depression or anxiety, speech and thought processing grossly intact  ASSESSMENT AND PLAN:  Discussed the following assessment and plan:  Cough   -discussed possible causes including GERD 2/2 hiatal hernia, medications, allergies, viral, CHF exacerbation- less likely as without wt gain or LE edema -discussed avoiding foods known to cause problems. -consider keeping a food diary. - Plan: omeprazole (PRILOSEC) 20 MG capsule  H/O hiatal hernia -wt loss encouraged.  Discussed f/u in the next 1-2 wks.   I discussed the assessment and treatment plan with the patient. The patient was provided an opportunity to ask questions and all were answered. The patient agreed with the plan and demonstrated an understanding of the instructions.   The patient was advised to call back or seek an in-person evaluation if the symptoms  worsen or if the condition fails to improve as anticipated.   [name], MD

## 2019-01-27 NOTE — Telephone Encounter (Signed)
Pt requested to be seen for bleeding hemorrhoid and fecal incontinence.  He would like to discuss colonoscopy since he stated that "he mentally cannot be examined from behind without being sedated."  [place].  Next available appointment is 03/11/19.  Pt requested a sooner appointment.  Please advise.

## 2019-01-28 DIAGNOSIS — R7989 Other specified abnormal findings of blood chemistry: Secondary | ICD-10-CM

## 2019-01-28 DIAGNOSIS — R0600 Dyspnea, unspecified: Secondary | ICD-10-CM

## 2019-01-29 NOTE — Telephone Encounter (Signed)
Spoke with Imanol and made him an appointment for 02/12/2019 at 11:30 AM.

## 2019-01-29 NOTE — Telephone Encounter (Signed)
I can see him in first 2 weeks of November at 1130 or 350 when one of those is available - PJ can open it

## 2019-01-31 ENCOUNTER — Other Ambulatory Visit: Payer: Self-pay

## 2019-01-31 ENCOUNTER — Telehealth (INDEPENDENT_AMBULATORY_CARE_PROVIDER_SITE_OTHER): Payer: Medicare Other | Admitting: Internal Medicine

## 2019-01-31 ENCOUNTER — Encounter: Payer: Self-pay | Admitting: Internal Medicine

## 2019-01-31 VITALS — Ht 71.0 in | Wt 344.0 lb

## 2019-01-31 DIAGNOSIS — I442 Atrioventricular block, complete: Secondary | ICD-10-CM | POA: Diagnosis not present

## 2019-01-31 DIAGNOSIS — Z950 Presence of cardiac pacemaker: Secondary | ICD-10-CM | POA: Diagnosis not present

## 2019-01-31 DIAGNOSIS — I5033 Acute on chronic diastolic (congestive) heart failure: Secondary | ICD-10-CM | POA: Diagnosis not present

## 2019-01-31 MED ORDER — METOLAZONE 5 MG PO TABS: 5.0000 mg | ORAL_TABLET | ORAL | 2 refills | Status: DC

## 2019-01-31 NOTE — Progress Notes (Signed)
Electrophysiology TeleHealth Note   Due to national recommendations of social distancing due to COVID 19, an audio/video telehealth visit is felt to be most appropriate for this patient at this time.  See MyChart message from today for the patient's consent to telehealth for [facility].   Date:  02/01/2019   ID:  Frank [name], DOB 1945/11/18, MRN [id number]  Location: patient's home  Provider location: [address], [place]  Evaluation Performed: Follow-up visit  PCP:  [name], MD  Cardiologist:    Electrophysiologist:  SK   Chief Complaint:   Fluid overload  History of Present Illness:    Frank [name] is a 73 y.o. male who presents via audio/video conferencing for a telehealth visit today.  Since last being seen in our clinic for HFpEF with HCM, s/p myectomy and  Mitral valve replacement and is s/p CRT upgrade for complete heart block and interval worsening of volume status    the patient reports some significant improvement w metolazone but then subsequently worsening  He takes massive doses of diuretics; has known pulm Htn   Saw DB 2011 but declined RHC at that time,  Presumptive dx was chronic hypoxia with OSA and hypoventilation syndrome   DATE TEST EF   4/17 Echo    55-60 % Severe LVH (18/17). BAE  PA press 62 RV normal  10/18 Echo  45-50% Some segmental WMA Severe RVE, depressed EF LAE (63/2.1/33)  7/19 Echo  65% Severe LAE  PA pressure 74     Date Cr K Hgb  10/18 1.02  12.4   4/19  1.18 4.1 11.1  1/20   12.6  8/20 1.29 4.7       The patient denies symptoms of fevers, chills, cough, or new SOB worrisome for COVID 19.    Past Medical History:  Diagnosis Date  . Atrial arrhythmia    status post ablation in [place] with complication including damage to the aortic valve  . Atrial fibrillation ([place])   . AV block, 1st degree   . Bradycardia   . Cluster headache ~ 1980   "went away after I started taking one of the  heart RXs" (06/29/2017)  . Diastolic CHF, chronic ([place])   . Diverticulosis   . History of gout   . HTN (hypertension)   . Hypertrophic cardiomyopathy ([place])    s/p myoectomy  . Infection   . Internal hemorrhoids   . Morbid obesity ([place])   . Morbid obesity with BMI of 45.0-49.9, adult ([place])   . Pacemaker  -SJM   . Skin cancer    "burned off RUE X 1; cut off nose X 2" (06/29/2017)  . TIA (transient ischemic attack) 2001  . Tubulovillous adenoma     Past Surgical History:  Procedure Laterality Date  . 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 BIV PACEMAKER INSERTION CRT-P N/A 07/27/2017   Procedure: BIV PACEMAKER INSERTION CRT-P;  Surgeon: [name], MD;  Location: [facility];  Service: Cardiovascular;  Laterality: N/A;  . BIV UPGRADE N/A 05/28/2017   Procedure: BIV [place];  Surgeon: [name], MD;  Location: [facility];  Service: Cardiovascular;  Laterality: N/A;  . CARDIAC CATHETERIZATION     "couple times" (06/29/2017)  . CARDIAC VALVE REPLACEMENT    . COLONOSCOPY    . COLONOSCOPY WITH PROPOFOL N/A 04/13/2016   Procedure: COLONOSCOPY WITH PROPOFOL;  Surgeon: Gatha Mayer, MD;  Location: [facility];  Service: Endoscopy;  Laterality: N/A;  . INSERT / REPLACE / REMOVE PACEMAKER  ~ [address] Frank [name]   . 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  . PACEMAKER LEAD REMOVAL N/A 06/29/2017   Procedure: [id number]  REMOVAL AND Four LEAD REMOVAL;  Surgeon: Evans Lance, MD;  Location: [facility];  Service: Cardiovascular;  Laterality: N/A;  . PACEMAKER REMOVAL  06/29/2017   GERNERATOR  REMOVAL AND Four LEAD REMOVAL; put in external pacemaker (06/29/2017)  . septal myectomy  07/22/1997  . SKIN CANCER EXCISION      Current Outpatient Medications  Medication Sig Dispense Refill  . acetaminophen (TYLENOL) 325 MG tablet Take 2 tablets (650 mg total) by mouth every 6 (six) hours as  needed for mild pain.    . Ascorbic Acid (VITAMIN C) 1000 MG tablet Take 2,000 mg by mouth daily.      Marland Kitchen aspirin 81 MG tablet Take 81 mg by mouth daily.    . diphenhydramine-acetaminophen (TYLENOL PM) 25-500 MG TABS tablet Take 2 tablets by mouth at bedtime as needed (for sleep).    . fish oil-omega-3 fatty acids 1000 MG capsule Take 4 g by mouth every morning.     Marland Kitchen JANTOVEN 10 MG tablet Take as directed by Coumad[facility] Clinic 120 tablet 1  . losartan (COZAAR) 100 MG tablet Take 1 tablet by mouth once daily 90 tablet 0  . Magnesium 250 MG TABS 5 tablets by mouth daily    . metolazone (ZAROXOLYN) 5 MG tablet Take 1 tablet (5 mg total) by mouth once a week. 15 tablet 2  . Multiple Vitamin (MULTIVITAMIN) tablet Take 1 tablet by mouth daily.      Marland Kitchen omeprazole (PRILOSEC) 20 MG capsule Take 1 capsule (20 mg total) by mouth daily. 30 capsule 3  . potassium chloride (K-DUR) 10 MEQ tablet Take 13 tablets (130 mEq total) by mouth daily. Patient takes 13 tablets throughout the day Take 141mEq on days you take Metolazone 1226 tablet 3  . potassium chloride (KLOR-CON) 10 MEQ tablet Take 130 mEq by mouth daily. Patient takes 13 tablets a day    . spironolactone (ALDACTONE) 25 MG tablet Take 1 tablet by mouth once daily 90 tablet 0  . torsemide (DEMADEX) 20 MG tablet Take 60 mg by mouth 2 (two) times daily.     No current facility-administered medications for this visit.     Allergies:   Warfarin and related and Lovenox [enoxaparin sodium]   Social History:  The patient  reports that he quit smoking about 41 years ago. His smoking use included cigarettes, pipe, and cigars. He quit after 10.00 years of use. He has never used smokeless tobacco. He reports current alcohol use of about 2.0 standard drinks of alcohol per week. He reports that he does not use drugs.   Family History:  The patient's   family history includes Colon cancer (age of onset: 11) in his father; Crohn's disease in his paternal grandmother;  Heart disease in his brother, father, sister, and sister; Obesity in his sister; Uterine cancer in his sister.   ROS:  Please see the history of present illness.   All other systems are personally reviewed and negative.    Exam:    Vital Signs:  Ht 5\' 11"  (1.803 m)   Wt (!) 344 lb (156 kg)   SpO2 94%   BMI 47.98 kg/m  Labs/Other Tests and Data Reviewed:    Recent Labs: 05/01/2018: Hemoglobin 12.6; Platelets 245 12/02/2018: BUN 22; Creatinine, Ser 1.29; Magnesium 2.6; Potassium 4.7; Sodium 136   Wt Readings from Last 3 Encounters:  01/31/19 (!) 344 lb (156 kg)  12/02/18 (!) 349 lb 12.8 oz (158.7 kg)  07/10/18 252 lb (114.3 kg)     Other studies personally reviewed: As above   ASSESSMENT & PLAN:    Atrial fibrillation-paroxysmal  Mitral valve replacement-mechanical  Hypertrophic cardiomyopathy status post myectomy  Cardiomyopathy-pacer induced-resolved  HFpEF  Complete heart block  VT  Nonsustained   Pulmonary Hypertension  Have discussed continuing the weekly use of metolazone and will decrease losartan 100>>50  I think we should look at Pulm HTN again, the PA estimates over the last few years 55>>65>>75 or so   COVID 19 screen The patient denies symptoms of COVID 19 at this time.  The importance of social distancing was discussed today.  Follow-up:  3 m    Current medicines are reviewed at length with the patient today.   The patient has concerns regarding his medicines.  The following changes were made today:  none  Labs/ tests ordered today include: BMET in 4weeks  No orders of the defined types were placed in this encounter.   Future tests ( post COVID )  Echo  in 1  months  Patient Risk:  after full review of this patients clinical status, I feel that they are at moderate  risk at this time.  Today, I have spent 12 minutes with the patient with telehealth technology discussing the above.  Signed, [name], MD  02/01/2019 7:43 AM      [place] [address] [phone] (office) [phone] (fax)

## 2019-02-03 NOTE — Telephone Encounter (Signed)
-----   Message from [name], MD sent at 02/01/2019  7:46 AM EDT ----- L can you plz arrange an echo when he gets back to town  Thanks SK

## 2019-02-03 NOTE — Telephone Encounter (Signed)
FYI unsure of the reason

## 2019-02-12 ENCOUNTER — Other Ambulatory Visit: Payer: Self-pay

## 2019-02-12 ENCOUNTER — Ambulatory Visit (INDEPENDENT_AMBULATORY_CARE_PROVIDER_SITE_OTHER): Payer: Medicare Other | Admitting: Internal Medicine

## 2019-02-12 DIAGNOSIS — R194 Change in bowel habit: Secondary | ICD-10-CM | POA: Diagnosis not present

## 2019-02-12 DIAGNOSIS — K648 Other hemorrhoids: Secondary | ICD-10-CM

## 2019-02-12 DIAGNOSIS — K625 Hemorrhage of anus and rectum: Secondary | ICD-10-CM | POA: Diagnosis not present

## 2019-02-12 DIAGNOSIS — Z7901 Long term (current) use of anticoagulants: Secondary | ICD-10-CM | POA: Diagnosis not present

## 2019-02-12 NOTE — Progress Notes (Signed)
This is a telehealth encounter, 26 minutes spent on a video call, no other participants other than the patient.  The patient was at home.  Frank [name] 73 y.o. 25-Oct-1945 [id number]  Assessment & Plan:   Encounter Diagnoses  Name Primary?  . Change in bowel habits Yes  . Rectal bleeding   . Hemorrhoids, complicated   . Chronic anticoagulation    I explained that I need to examine him to understand what is going on here.  He has a bit of odd characteristics to his symptoms and that he seems to only have problems when he is upright and moving.  I wonder if he is not having severe symptomatic prolapsing hemorrhoids.  If he is able to tolerate a rectal exam and hopefully in a anoscopy I might be able to sort that out without a colonoscopy.  At any rate telehealth is not adequate.  He will see me in 2 days in the office.  I will ask Dr. [name] to do a CBC when he does a BME T and I think he is getting his INR checked.  I explained to the patient that if this persists and worsens he may just need to go to the emergency department to get appropriate expeditious care.  He did not wish to do that now.  I also explained that a colonoscopy if elective could take a week or more to set up given need for Covid testing his comorbidities and my availability. Subjective:   Chief Complaint: Diarrhea and rectal bleeding, change in bowel movements  HPI Frank [name] has been having several weeks of diarrhea though not watery stools just frequent defecation sometimes with bleeding.  Hemorrhoids are "out".  He says that when he sitting and he still he is fine but when he is up and moving he has the urge to go to the bathroom and a small amount of normal stool come out at times or he will have discharges of blood that is bright red.  He has known hemorrhoids last colonoscopy in 2018 with hemorrhoids and polyps.  He decided not to come to the office today because he felt like he might not make it without having  incontinence or fecal smearing or bleeding.  He is not had problems quite like this before though he does have the prolapsing hemorrhoids.  There are some anal soreness but no severe pain.  He relates a history of having a rectal exam by a physician 20 years ago and he has a posttraumatic experience from that and in general prefers to be sedated if he is going to have a rectal exam.  He would like to be scheduled for a colonoscopy.  His last hemoglobin was 12.6 in January of this year.  On September 17 his INR was 3.  He has significant congestive heart failure with an AICD.  He has an appointment with cardiology in 2 days in the late morning.  He recently returned from his second home in the [place].  He spent most of the summer there.  He did come home for a while in August. Allergies  Allergen Reactions  . Warfarin And Related Other (See Comments)    Per the patient's wife, "Warfain made him have a TIA" CAN TAKE BRAND NAME COUMADIN   . Lovenox [Enoxaparin Sodium] Other (See Comments)    Causes large lumps at injection site   Current Meds  Medication Sig  . acetaminophen (TYLENOL) 325 MG tablet Take 2  tablets (650 mg total) by mouth every 6 (six) hours as needed for mild pain.  . Ascorbic Acid (VITAMIN C) 1000 MG tablet Take 2,000 mg by mouth daily.    Marland Kitchen aspirin 81 MG tablet Take 81 mg by mouth daily.  . diphenhydramine-acetaminophen (TYLENOL PM) 25-500 MG TABS tablet Take 2 tablets by mouth at bedtime as needed (for sleep).  . fish oil-omega-3 fatty acids 1000 MG capsule Take 4 g by mouth every morning.   Marland Kitchen JANTOVEN 10 MG tablet Take as directed by Coumadin Clinic  . losartan (COZAAR) 100 MG tablet Take 1 tablet by mouth once daily  . Magnesium 250 MG TABS 5 tablets by mouth daily  . metolazone (ZAROXOLYN) 5 MG tablet Take 1 tablet (5 mg total) by mouth once a week.  . Multiple Vitamin (MULTIVITAMIN) tablet Take 1 tablet by mouth daily.    . potassium chloride (K-DUR) 10  MEQ tablet Take 13 tablets (130 mEq total) by mouth daily. Patient takes 13 tablets throughout the day Take 148mEq on days you take Metolazone  . potassium chloride (KLOR-CON) 10 MEQ tablet Take 130 mEq by mouth daily. Patient takes 13 tablets a day  . spironolactone (ALDACTONE) 25 MG tablet Take 1 tablet by mouth once daily  . torsemide (DEMADEX) 20 MG tablet Take 60 mg by mouth 2 (two) times daily.   Past Medical History:  Diagnosis Date  . Atrial arrhythmia    status post ablation in [place] with complication including damage to the aortic valve  . Atrial fibrillation ([place])   . AV block, 1st degree   . Bradycardia   . Cluster headache ~ 1980   "went away after I started taking one of the heart RXs" (06/29/2017)  . Diastolic CHF, chronic ([place])   . Diverticulosis   . History of gout   . HTN (hypertension)   . Hypertrophic cardiomyopathy ([place])    s/p myoectomy  . Infection   . Internal hemorrhoids   . Morbid obesity ([place])   . Morbid obesity with BMI of 45.0-49.9, adult ([place])   . Pacemaker  -SJM   . Skin cancer    "burned off RUE X 1; cut off nose X 2" (06/29/2017)  . TIA (transient ischemic attack) 2001  . Tubulovillous adenoma    Past Surgical History:  Procedure Laterality Date  . 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 BIV PACEMAKER INSERTION CRT-P N/A 07/27/2017   Procedure: BIV PACEMAKER INSERTION CRT-P;  Surgeon: [name], MD;  Location: [facility];  Service: Cardiovascular;  Laterality: N/A;  . BIV UPGRADE N/A 05/28/2017   Procedure: BIV [place];  Surgeon: [name], MD;  Location: [facility];  Service: Cardiovascular;  Laterality: N/A;  . CARDIAC CATHETERIZATION     "couple times" (06/29/2017)  . CARDIAC VALVE REPLACEMENT    . COLONOSCOPY    . COLONOSCOPY WITH PROPOFOL N/A 04/13/2016   Procedure: COLONOSCOPY WITH PROPOFOL;  Surgeon: Gatha Mayer, MD;  [facility];   Service: Endoscopy;  Laterality: N/A;  . INSERT / REPLACE / REMOVE PACEMAKER  ~ [address] [employer]   . 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 [facility]  . PACEMAKER LEAD REMOVAL N/A 06/29/2017   Procedure: [id number]  REMOVAL AND Four LEAD REMOVAL;  Surgeon: Evans Lance, MD;  Location: [facility];  Service: Cardiovascular;  Laterality: N/A;  . PACEMAKER REMOVAL  06/29/2017  GERNERATOR  REMOVAL AND Four LEAD REMOVAL; put in external pacemaker (06/29/2017)  . septal myectomy  07/22/1997  . SKIN CANCER EXCISION     Social History   Social History Narrative   Married, one adopted son and one daughter. 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   2 caffeinated beverages daily   family history includes Colon cancer (age of onset: 86) in his father; Crohn's disease in his paternal grandmother; Heart disease in his brother, father, sister, and sister; Obesity in his sister; Uterine cancer in his sister.   Review of Systems As per HPI  Objective:   Physical Exam Limited to what I could see on the video portion of this, chronically ill slightly pale I think.  Alert and oriented x3 and appropriate mood and affect.

## 2019-02-12 NOTE — Patient Instructions (Addendum)
As we discussed, I plan to see you at 350 PM on Friday, November 6.  My medical assistant will explain more.  We will ask Dr. [name] to add a complete blood count to your labs.  I have sent him a message in and my medical assistant will call as well.  Should you worsen or otherwise feel the need I suggest you present to the emergency department for further evaluation and care.  We will do an abdominal xray prior to your visit with me.  I appreciate the opportunity to care for you. Silvano Rusk, MD, [facility]

## 2019-02-13 ENCOUNTER — Telehealth: Payer: Self-pay

## 2019-02-13 DIAGNOSIS — K625 Hemorrhage of anus and rectum: Secondary | ICD-10-CM

## 2019-02-13 NOTE — Telephone Encounter (Signed)
Per Dr Celesta Aver message I have called over to Dr Olin Pia office to make sure when Frank [name] goes there tomorrow they get the CBC drawn. I called and left a detailed voice mail for Keylen to go to our x-ray department prior to his appointment with [name] tomorrow.

## 2019-02-13 NOTE — Telephone Encounter (Signed)
-----   Message from Gatha Mayer, MD sent at 02/13/2019  7:47 AM EST ----- Regarding: RE: Request to draw some other labs please Thanks for responding - I will get my staff to work on this  Barbera Setters - this man said he was to have labs at cardiology Friday - can we talk to someone there and have CBC and ferritin done?  There is a BMET in the orders  Ideally would like them done so I can see results later on Friday but that may not be possible due to his travel issues    CEG ----- Message ----- From: [name], MD Sent: 02/12/2019   8:54 PM EST To: Gatha Mayer, MD Subject: RE: Request to draw some other labs please     Glendell Docker.  Am on vacation.  Sorry. ----- Message ----- From: Gatha Mayer, MD Sent: 02/12/2019   8:52 PM EST To: [name], MD Subject: Request to draw some other labs please         Richardson Landry,  This man is seeing you Friday and will see me later that day.  He has been having a lot of rectal bleeding which may just be hemorrhoidal but no CBC since January.  Would you please add that to the labs that you are checking?  Thanks,  Glendell Docker

## 2019-02-13 NOTE — Telephone Encounter (Signed)
Patient notified okay for labs at Cardiology He says you want to see him on Friday? He also says he is to have xrays?

## 2019-02-13 NOTE — Telephone Encounter (Signed)
He is on for 350 11/6   I guess we talked about an xray but I did not ask PJ to set that up - I just added to AVS - let's have him come in time to do a DG abdomen 2 view re; change in bowel habits

## 2019-02-13 NOTE — Telephone Encounter (Signed)
I left a message for the patient to come for x-ray and appt tomorrow at 3:50

## 2019-02-14 ENCOUNTER — Other Ambulatory Visit: Payer: Medicare Other | Admitting: *Deleted

## 2019-02-14 ENCOUNTER — Ambulatory Visit (HOSPITAL_COMMUNITY): Payer: Medicare Other | Attending: Cardiology

## 2019-02-14 ENCOUNTER — Ambulatory Visit (INDEPENDENT_AMBULATORY_CARE_PROVIDER_SITE_OTHER): Payer: Medicare Other | Admitting: *Deleted

## 2019-02-14 ENCOUNTER — Other Ambulatory Visit: Payer: Self-pay

## 2019-02-14 ENCOUNTER — Ambulatory Visit (INDEPENDENT_AMBULATORY_CARE_PROVIDER_SITE_OTHER)
Admission: RE | Admit: 2019-02-14 | Discharge: 2019-02-14 | Disposition: A | Discharge status: Home / Self Care | Payer: Medicare Other | Source: Ambulatory Visit | Attending: Internal Medicine | Admitting: Internal Medicine

## 2019-02-14 ENCOUNTER — Ambulatory Visit (INDEPENDENT_AMBULATORY_CARE_PROVIDER_SITE_OTHER): Payer: Medicare Other | Admitting: Internal Medicine

## 2019-02-14 VITALS — BP 124/73 | HR 60 | Temp 98.0°F | Ht 71.0 in | Wt 344.0 lb

## 2019-02-14 DIAGNOSIS — G459 Transient cerebral ischemic attack, unspecified: Secondary | ICD-10-CM

## 2019-02-14 DIAGNOSIS — R194 Change in bowel habit: Secondary | ICD-10-CM

## 2019-02-14 DIAGNOSIS — I4891 Unspecified atrial fibrillation: Secondary | ICD-10-CM

## 2019-02-14 DIAGNOSIS — Z7901 Long term (current) use of anticoagulants: Secondary | ICD-10-CM

## 2019-02-14 DIAGNOSIS — K625 Hemorrhage of anus and rectum: Secondary | ICD-10-CM

## 2019-02-14 DIAGNOSIS — R7989 Other specified abnormal findings of blood chemistry: Secondary | ICD-10-CM | POA: Diagnosis not present

## 2019-02-14 DIAGNOSIS — Z5181 Encounter for therapeutic drug level monitoring: Secondary | ICD-10-CM

## 2019-02-14 DIAGNOSIS — R0600 Dyspnea, unspecified: Secondary | ICD-10-CM | POA: Insufficient documentation

## 2019-02-14 DIAGNOSIS — K5900 Constipation, unspecified: Secondary | ICD-10-CM | POA: Diagnosis not present

## 2019-02-14 DIAGNOSIS — Z9889 Other specified postprocedural states: Secondary | ICD-10-CM

## 2019-02-14 DIAGNOSIS — Z86010 Personal history of colonic polyps: Secondary | ICD-10-CM

## 2019-02-14 LAB — POCT INR: INR: 3.6 — AB (ref 2.0–3.0)

## 2019-02-14 NOTE — Patient Instructions (Signed)
Description   Tomorrow take 5mg  then continue on same dosage 10mg  daily except 15mg  on Sundays and Wednesdays. Recheck in 6 weeks in the office. Call with any concerns any new medications or if scheduled for any procedures  [phone]

## 2019-02-14 NOTE — H&P (View-Only) (Signed)
Frank [name] 73 y.o. 1946/02/09 [id number]  Assessment & Plan:   Encounter Diagnoses  Name Primary?  . Rectal bleeding Yes  . Change in bowel habits   . Chronic anticoagulation     It still could be hemorrhoids but there is nothing obvious from inspection.  He needs a sigmoidoscopy versus a colonoscopy.  He is significantly chronically ill and dyspneic.  He does not look particularly anemic on exam but I do not have his hemoglobin yet.  Once I see his labs from today we will regroup and plan out a colonoscopy at the hospital.  He will need bridging with Lovenox.  I will need help from cardiology.   [id number], Langley Adie, MD    Subjective:   Chief Complaint: Rectal bleeding  HPI Frank [name] is here for follow-up I had a telehealth visit with him 2 days ago.  He is having a lot of intermittent rectal bleeding and abdominal bloating symptoms.  I had him do a 2 view abdomen today and it looks okay to my read final radiology review pending.  I wondered if he might not have hemorrhoids so I asked him to come in for an exam.  He does have a history of adenomatous colon polyps last colonoscopy 2018.  He takes chronic anticoagulation for cardiac conditions including a mitral valve replacement.  He has been very weak and fatigued and short of breath.  He had a traumatic experience with a rectal exam 20 years ago and prefers not to have a digital rectal exam while awake. Allergies  Allergen Reactions  . Warfarin And Related Other (See Comments)    Per the patient's wife, "Warfain made him have a TIA" CAN TAKE BRAND NAME COUMADIN   . Lovenox [Enoxaparin Sodium] Other (See Comments)    Causes large lumps at injection site   Current Meds  Medication Sig  . acetaminophen (TYLENOL) 325 MG tablet Take 2 tablets (650 mg total) by mouth every 6 (six) hours as needed for mild pain.  . Ascorbic Acid (VITAMIN C) 1000 MG tablet Take 2,000 mg by mouth daily.    Marland Kitchen aspirin 81 MG tablet Take 81 mg by  mouth daily.  . diphenhydramine-acetaminophen (TYLENOL PM) 25-500 MG TABS tablet Take 2 tablets by mouth at bedtime as needed (for sleep).  . fish oil-omega-3 fatty acids 1000 MG capsule Take 4 g by mouth every morning.   Marland Kitchen JANTOVEN 10 MG tablet Take as directed by [facility]  . losartan (COZAAR) 100 MG tablet Take 1 tablet by mouth once daily  . Magnesium 250 MG TABS 5 tablets by mouth daily  . metolazone (ZAROXOLYN) 5 MG tablet Take 1 tablet (5 mg total) by mouth once a week.  . Multiple Vitamin (MULTIVITAMIN) tablet Take 1 tablet by mouth daily.    . potassium chloride (K-DUR) 10 MEQ tablet Take 13 tablets (130 mEq total) by mouth daily. Patient takes 13 tablets throughout the day Take 156mEq on days you take Metolazone  . potassium chloride (KLOR-CON) 10 MEQ tablet Take 130 mEq by mouth daily. Patient takes 13 tablets a day  . spironolactone (ALDACTONE) 25 MG tablet Take 1 tablet by mouth once daily  . torsemide (DEMADEX) 20 MG tablet Take 60 mg by mouth 2 (two) times daily.   Past Medical History:  Diagnosis Date  . Atrial arrhythmia    status post ablation in [place] with complication including damage to the aortic valve  . Atrial fibrillation ([place])   . AV block,  1st degree   . Bradycardia   . Cluster headache ~ 1980   "went away after I started taking one of the heart RXs" (06/29/2017)  . Diastolic CHF, chronic ([place])   . Diverticulosis   . History of gout   . HTN (hypertension)   . Hypertrophic cardiomyopathy ([place])    s/p myoectomy  . Infection   . Internal hemorrhoids   . Morbid obesity ([place])   . Morbid obesity with BMI of 45.0-49.9, adult ([place])   . Pacemaker  -SJM   . Skin cancer    "burned off RUE X 1; cut off nose X 2" (06/29/2017)  . TIA (transient ischemic attack) 2001  . Tubulovillous adenoma    Past Surgical History:  Procedure Laterality Date  . 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 BIV  PACEMAKER INSERTION CRT-P N/A 07/27/2017   Procedure: BIV PACEMAKER INSERTION CRT-P;  Surgeon: [name], MD;  Location: [facility];  Service: Cardiovascular;  Laterality: N/A;  . BIV UPGRADE N/A 05/28/2017   Procedure: BIV [place];  Surgeon: [name], MD;  Location: [facility];  Service: Cardiovascular;  Laterality: N/A;  . CARDIAC CATHETERIZATION     "couple times" (06/29/2017)  . CARDIAC VALVE REPLACEMENT    . COLONOSCOPY    . COLONOSCOPY WITH PROPOFOL N/A 04/13/2016   Procedure: COLONOSCOPY WITH PROPOFOL;  Surgeon: Gatha Mayer, MD;  [facility];  Service: Endoscopy;  Laterality: N/A;  . INSERT / REPLACE / REMOVE PACEMAKER  ~ [address] Jude [employer]ent   . 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 Jude  . PACEMAKER LEAD REMOVAL N/A 06/29/2017   Procedure: [id number]  REMOVAL AND Four LEAD REMOVAL;  Surgeon: Evans Lance, MD;  Location: [facility];  Service: Cardiovascular;  Laterality: N/A;  . PACEMAKER REMOVAL  06/29/2017   GERNERATOR  REMOVAL AND Four LEAD REMOVAL; put in external pacemaker (06/29/2017)  . septal myectomy  07/22/1997  . SKIN CANCER EXCISION     Social History   Social History Narrative   Married, one adopted son and one daughter. 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   2 caffeinated beverages daily   family history includes Colon cancer (age of onset: 20) in his father; Crohn's disease in his paternal grandmother; Heart disease in his brother, father, sister, and sister; Obesity in his sister; Uterine cancer in his sister.   Review of Systems As above  Objective:   Physical Exam Vitals Obese, chronicall ill debilitated white man SOB moving to exam table from wheelchair Eyes anicteric Lungs CTA Cor HSM and metallic S1 abd morbidly obese Rectum - inspected - sl perianal erythema o/w NL

## 2019-02-14 NOTE — Patient Instructions (Signed)
We will contact you next week with lab results and plans.    I appreciate the opportunity to care for you. Silvano Rusk, MD, [facility]

## 2019-02-14 NOTE — Progress Notes (Signed)
Frank [name] 73 y.o. 08/09/1945 [id number]  Assessment & Plan:   Encounter Diagnoses  Name Primary?  . Rectal bleeding Yes  . Change in bowel habits   . Chronic anticoagulation     It still could be hemorrhoids but there is nothing obvious from inspection.  He needs a sigmoidoscopy versus a colonoscopy.  He is significantly chronically ill and dyspneic.  He does not look particularly anemic on exam but I do not have his hemoglobin yet.  Once I see his labs from today we will regroup and plan out a colonoscopy at the hospital.  He will need bridging with Lovenox.  I will need help from cardiology.   [id number], Langley Adie, MD    Subjective:   Chief Complaint: Rectal bleeding  HPI Frank [name] is here for follow-up I had a telehealth visit with him 2 days ago.  He is having a lot of intermittent rectal bleeding and abdominal bloating symptoms.  I had him do a 2 view abdomen today and it looks okay to my read final radiology review pending.  I wondered if he might not have hemorrhoids so I asked him to come in for an exam.  He does have a history of adenomatous colon polyps last colonoscopy 2018.  He takes chronic anticoagulation for cardiac conditions including a mitral valve replacement.  He has been very weak and fatigued and short of breath.  He had a traumatic experience with a rectal exam 20 years ago and prefers not to have a digital rectal exam while awake. Allergies  Allergen Reactions  . Warfarin And Related Other (See Comments)    Per the patient's wife, "Warfain made him have a TIA" CAN TAKE BRAND NAME COUMADIN   . Lovenox [Enoxaparin Sodium] Other (See Comments)    Causes large lumps at injection site   Current Meds  Medication Sig  . acetaminophen (TYLENOL) 325 MG tablet Take 2 tablets (650 mg total) by mouth every 6 (six) hours as needed for mild pain.  . Ascorbic Acid (VITAMIN C) 1000 MG tablet Take 2,000 mg by mouth daily.    Marland Kitchen aspirin 81 MG tablet Take 81 mg by  mouth daily.  . diphenhydramine-acetaminophen (TYLENOL PM) 25-500 MG TABS tablet Take 2 tablets by mouth at bedtime as needed (for sleep).  . fish oil-omega-3 fatty acids 1000 MG capsule Take 4 g by mouth every morning.   Marland Kitchen JANTOVEN 10 MG tablet Take as directed by Coumadin Clinic  . losartan (COZAAR) 100 MG tablet Take 1 tablet by mouth once daily  . Magnesium 250 MG TABS 5 tablets by mouth daily  . metolazone (ZAROXOLYN) 5 MG tablet Take 1 tablet (5 mg total) by mouth once a week.  . Multiple Vitamin (MULTIVITAMIN) tablet Take 1 tablet by mouth daily.    . potassium chloride (K-DUR) 10 MEQ tablet Take 13 tablets (130 mEq total) by mouth daily. Patient takes 13 tablets throughout the day Take 147mEq on days you take Metolazone  . potassium chloride (KLOR-CON) 10 MEQ tablet Take 130 mEq by mouth daily. Patient takes 13 tablets a day  . spironolactone (ALDACTONE) 25 MG tablet Take 1 tablet by mouth once daily  . torsemide (DEMADEX) 20 MG tablet Take 60 mg by mouth 2 (two) times daily.   Past Medical History:  Diagnosis Date  . Atrial arrhythmia    status post ablation in [place] with complication including damage to the aortic valve  . Atrial fibrillation ([place])   . AV block,  1st degree   . Bradycardia   . Cluster headache ~ 1980   "went away after I started taking one of the heart RXs" (06/29/2017)  . Diastolic CHF, chronic ([place])   . Diverticulosis   . History of gout   . HTN (hypertension)   . Hypertrophic cardiomyopathy ([place])    s/p myoectomy  . Infection   . Internal hemorrhoids   . Morbid obesity ([place])   . Morbid obesity with BMI of 45.0-49.9, adult ([place])   . Pacemaker  -SJM   . Skin cancer    "burned off RUE X 1; cut off nose X 2" (06/29/2017)  . TIA (transient ischemic attack) 2001  . Tubulovillous adenoma    Past Surgical History:  Procedure Laterality Date  . 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 BIV  PACEMAKER INSERTION CRT-P N/A 07/27/2017   Procedure: BIV PACEMAKER INSERTION CRT-P;  Surgeon: [name], MD;  Location: [facility];  Service: Cardiovascular;  Laterality: N/A;  . BIV UPGRADE N/A 05/28/2017   Procedure: BIV [place];  Surgeon: [name], MD;  Location: [facility];  Service: Cardiovascular;  Laterality: N/A;  . CARDIAC CATHETERIZATION     "couple times" (06/29/2017)  . CARDIAC VALVE REPLACEMENT    . COLONOSCOPY    . COLONOSCOPY WITH PROPOFOL N/A 04/13/2016   Procedure: COLONOSCOPY WITH PROPOFOL;  Surgeon: Gatha Mayer, MD;  [facility];  Service: Endoscopy;  Laterality: N/A;  . INSERT / REPLACE / REMOVE PACEMAKER  ~ [address] Jude [employer]ent   . 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 Jude  . PACEMAKER LEAD REMOVAL N/A 06/29/2017   Procedure: [id number]  REMOVAL AND Four LEAD REMOVAL;  Surgeon: Evans Lance, MD;  Location: [facility];  Service: Cardiovascular;  Laterality: N/A;  . PACEMAKER REMOVAL  06/29/2017   GERNERATOR  REMOVAL AND Four LEAD REMOVAL; put in external pacemaker (06/29/2017)  . septal myectomy  07/22/1997  . SKIN CANCER EXCISION     Social History   Social History Narrative   Married, one adopted son and one daughter. 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   2 caffeinated beverages daily   family history includes Colon cancer (age of onset: 28) in his father; Crohn's disease in his paternal grandmother; Heart disease in his brother, father, sister, and sister; Obesity in his sister; Uterine cancer in his sister.   Review of Systems As above  Objective:   Physical Exam Vitals Obese, chronicall ill debilitated white man SOB moving to exam table from wheelchair Eyes anicteric Lungs CTA Cor HSM and metallic S1 abd morbidly obese Rectum - inspected - sl perianal erythema o/w NL

## 2019-02-14 NOTE — Addendum Note (Signed)
Addended by: Derrel Nip B on: 02/14/2019 03:53 PM   Modules accepted: Level of Service

## 2019-02-15 LAB — BASIC METABOLIC PANEL
BUN/Creatinine Ratio: 26 — ABNORMAL HIGH (ref 10–24)
BUN: 42 mg/dL — ABNORMAL HIGH (ref 8–27)
CO2: 25 mmol/L (ref 20–29)
Calcium: 9.4 mg/dL (ref 8.6–10.2)
Chloride: 97 mmol/L (ref 96–106)
Creatinine, Ser: 1.63 mg/dL — ABNORMAL HIGH (ref 0.76–1.27)
GFR calc Af Amer: 49 mL/min/{1.73_m2} — ABNORMAL LOW (ref >59)
GFR calc non Af Amer: 42 mL/min/{1.73_m2} — ABNORMAL LOW (ref >59)
Glucose: 89 mg/dL (ref 65–99)
Potassium: 5.4 mmol/L — ABNORMAL HIGH (ref 3.5–5.2)
Sodium: 138 mmol/L (ref 134–144)

## 2019-02-15 LAB — CBC
Hematocrit: 28.9 % — ABNORMAL LOW (ref 37.5–51.0)
Hemoglobin: 9.4 g/dL — ABNORMAL LOW (ref 13.0–17.7)
MCH: 31.1 pg (ref 26.6–33.0)
MCHC: 32.5 g/dL (ref 31.5–35.7)
MCV: 96 fL (ref 79–97)
Platelets: 237 10*3/uL (ref 150–450)
RBC: 3.02 x10E6/uL — ABNORMAL LOW (ref 4.14–5.80)
RDW: 14 % (ref 11.6–15.4)
WBC: 7.6 10*3/uL (ref 3.4–10.8)

## 2019-02-15 LAB — FERRITIN: Ferritin: 39 ng/mL (ref 30–400)

## 2019-02-17 ENCOUNTER — Telehealth: Payer: Self-pay | Admitting: Pharmacist

## 2019-02-17 NOTE — Progress Notes (Signed)
I spoke to him about anemia  PJ,  He needs a colonoscopy but needs a Lovenox Bridge for warfarin as he has an MVR.  Dx rectal bleedingm change in bowel habits and hx colon polyps Please schedule him for 11/20 at 1230 PM at [facility] as an outpatient ( I had though 11/18 but tell him that won't work) - that is open as of now   I have taken the liberty of ccing Megan Supple Pharm D to help with this (thanks Jinny Blossom!) so we can get this taken care of in time.  I have explained he will need a Covid-19 test

## 2019-02-17 NOTE — Addendum Note (Signed)
Addended by: Martin[place], Celedonio Sortino E on: 02/17/2019 05:29 PM   Modules accepted: Orders, SmartSet

## 2019-02-17 NOTE — Telephone Encounter (Signed)
Called pt and moved next INR check to 11/12, will coordinate bridging before colonoscopy. Pt prefers Arixtra (less painful injections) - he is aware we will use Lovenox pre op due to shorter half life and quicker clearance, and can bridge post-op with Arixtra.

## 2019-02-17 NOTE — Telephone Encounter (Signed)
Notes recorded by Gatha Mayer, MD on 02/17/2019 at 4:05 PM EST  I spoke to him about anemia   PJ,   He needs a colonoscopy but needs a Lovenox Bridge for warfarin as he has an MVR.   Dx rectal bleedingm change in bowel habits and hx colon polyps  Please schedule him for 11/20 at 1230 PM at [facility] as an outpatient ( I had though 11/18 but tell him that won't work) - that is open as of now    I have taken the liberty of ccing Jakyri Brunkhorst Pharm D to help with this (thanks Jinny Blossom!) so we can get this taken care of in time.   I have explained he will need a Covid-19 test

## 2019-02-18 NOTE — Addendum Note (Signed)
Addended by: [place], Arturo Sofranko E on: 02/18/2019 08:58 AM   Modules accepted: Orders

## 2019-02-20 ENCOUNTER — Ambulatory Visit (INDEPENDENT_AMBULATORY_CARE_PROVIDER_SITE_OTHER): Payer: Medicare Other | Admitting: Pharmacist

## 2019-02-20 ENCOUNTER — Telehealth: Payer: Self-pay | Admitting: Internal Medicine

## 2019-02-20 ENCOUNTER — Other Ambulatory Visit: Payer: Self-pay

## 2019-02-20 DIAGNOSIS — G459 Transient cerebral ischemic attack, unspecified: Secondary | ICD-10-CM | POA: Diagnosis not present

## 2019-02-20 DIAGNOSIS — I4891 Unspecified atrial fibrillation: Secondary | ICD-10-CM

## 2019-02-20 DIAGNOSIS — Z5181 Encounter for therapeutic drug level monitoring: Secondary | ICD-10-CM | POA: Diagnosis not present

## 2019-02-20 DIAGNOSIS — Z9889 Other specified postprocedural states: Secondary | ICD-10-CM | POA: Diagnosis not present

## 2019-02-20 LAB — POCT INR: INR: 3 (ref 2.0–3.0)

## 2019-02-20 MED ORDER — FONDAPARINUX SODIUM 10 MG/0.8ML ~~LOC~~ SOLN: 10.0000 mg | SUBCUTANEOUS | 1 refills | Status: DC

## 2019-02-20 MED ORDER — ENOXAPARIN SODIUM 150 MG/ML ~~LOC~~ SOLN: 150.0000 mg | Freq: Two times a day (BID) | SUBCUTANEOUS | 0 refills | Status: DC

## 2019-02-20 NOTE — Telephone Encounter (Signed)
Pt has questions regarding his scheduled colon 11/20 at [facility].

## 2019-02-20 NOTE — Patient Instructions (Addendum)
11/14: Last dose of Coumadin.  11/15: No Coumadin or Lovenox.  11/16: Inject Lovenox 150mg  in the fatty abdominal tissue at least 2 inches from the belly button twice a day about 12 hours apart, 9am and 9pm rotate sites. No Coumadin.  11/17: Inject Lovenox in the fatty tissue every 12 hours, 9am and 9pm. No Coumadin.  11/18: Inject Lovenox in the fatty tissue every 12 hours, 9am and 9pm. No Coumadin.  11/19: Inject Lovenox in the fatty tissue in the morning at 9 am (No PM dose). No Coumadin.  11/20: Procedure Day - No Lovenox - Resume Coumadin in the evening or as directed by doctor (take an extra half tablet with usual dose for 2 days then resume normal dose).  11/21: Start Arixtra in the fatty tissue once daily at 8 AM and take Coumadin.  11/22: Inject Arixtra in the fatty tissue once daily at 8 AM and take Coumadin.  11/23: Inject Arixtra in the fatty tissue once daily at 8 AM and take Coumadin.  11/24: Inject Arixtra in the fatty tissue once daily at 8 AM and take Coumadin.  11/25: Coumadin appt to check INR.

## 2019-02-20 NOTE — Telephone Encounter (Signed)
Spoke with Francee Piccolo and answered his questions mostly about appointment dates and times.

## 2019-02-21 ENCOUNTER — Telehealth: Payer: Self-pay

## 2019-02-21 ENCOUNTER — Other Ambulatory Visit: Payer: Self-pay | Admitting: Internal Medicine

## 2019-02-21 MED ORDER — METOLAZONE 5 MG PO TABS: 5.0000 mg | ORAL_TABLET | ORAL | 3 refills | Status: DC

## 2019-02-21 MED ORDER — POTASSIUM CHLORIDE CRYS ER 10 MEQ PO TBCR: 130.0000 meq | EXTENDED_RELEASE_TABLET | ORAL | 3 refills | Status: DC

## 2019-02-21 MED ORDER — TORSEMIDE 20 MG PO TABS: 60.0000 mg | ORAL_TABLET | Freq: Two times a day (BID) | ORAL | 3 refills | Status: DC

## 2019-02-21 MED ORDER — SPIRONOLACTONE 25 MG PO TABS: 25.0000 mg | ORAL_TABLET | Freq: Every day | ORAL | 3 refills | Status: DC

## 2019-02-21 MED ORDER — LOSARTAN POTASSIUM 50 MG PO TABS: 50.0000 mg | ORAL_TABLET | Freq: Every day | ORAL | 3 refills | Status: DC

## 2019-02-21 MED ORDER — LOSARTAN POTASSIUM 100 MG PO TABS: 100.0000 mg | ORAL_TABLET | Freq: Every day | ORAL | 3 refills | Status: DC

## 2019-02-21 NOTE — Telephone Encounter (Signed)
-----   Message from [name], MD sent at 02/19/2019  4:26 PM EST ----- Please Inform Patient that labs are mldly worse, but with his wieght his GFR is better than the estimated If his fluid status is stable will continue on current course  Thanks

## 2019-02-21 NOTE — Telephone Encounter (Signed)
Pt aware of lab results and recommendations. He continues to struggle with SOB and fatigue. He is wondering if this could be due to pacemaker settings. He states he has not been checked in a while. I will send a message to pacemaker clinic to review a remote pacer check (pt is sending it today). He would prefer someone to call him from the pacemaker clinic to discuss the results.  Refills for pt's cardiac meds have also been sent in per pt's request. Will send a message to pharmacy to refill pts Lovenox.

## 2019-02-24 ENCOUNTER — Inpatient Hospital Stay (HOSPITAL_COMMUNITY): Admission: RE | Admit: 2019-02-24 | Payer: Medicare Other | Source: Ambulatory Visit

## 2019-02-24 NOTE — Telephone Encounter (Signed)
The pt agreed to send a manual transmission tonight with his home remote monitor. I gave him my direct office number to call to get help if he needed it in the morning.

## 2019-02-25 ENCOUNTER — Other Ambulatory Visit (HOSPITAL_COMMUNITY)
Admission: RE | Admit: 2019-02-25 | Discharge: 2019-02-25 | Disposition: A | Discharge status: Home / Self Care | Payer: Medicare Other | Source: Ambulatory Visit | Attending: Internal Medicine | Admitting: Internal Medicine

## 2019-02-25 DIAGNOSIS — Z20828 Contact with and (suspected) exposure to other viral communicable diseases: Secondary | ICD-10-CM | POA: Diagnosis not present

## 2019-02-25 DIAGNOSIS — Z01812 Encounter for preprocedural laboratory examination: Secondary | ICD-10-CM | POA: Diagnosis not present

## 2019-02-25 NOTE — Telephone Encounter (Signed)
Remote transmission reviewed from 02/25/19 at 6:53 am an patient had no alerts or episodes of high atrial rates or v-rates. Device function WNL. BiV pacing >99 % and AT/AF burden < 1%. Reviewed with patient. He denies CP, SOB., dizziness or syncope.He reports ongoing issues with diarrhea and has colonoscopy scheduled for 11/ 20/20 . Encouraged to contact PCP concerning symptoms. ED precautions given if symptoms worsen or he develops cardiac symptoms.

## 2019-02-25 NOTE — Telephone Encounter (Signed)
Pt let me know he sent his transmission. I told him the nurse will review it and give him a call back.

## 2019-02-26 LAB — NOVEL CORONAVIRUS, NAA (HOSP ORDER, SEND-OUT TO REF LAB; TAT 18-24 HRS): SARS-CoV-2, NAA: NOT DETECTED

## 2019-02-27 ENCOUNTER — Encounter (HOSPITAL_COMMUNITY): Payer: Self-pay | Admitting: Emergency Medicine

## 2019-02-27 NOTE — Progress Notes (Signed)
Pre-op endo call complete.  Patient bridging with lovenox  Pacemaker - fax request for device orders to Dr [name] office  Patient c/o sob , generalized weakness ongoing for the last 2-3 months. He does have heart failure . Reports Dr [name] has been made aware of symptoms are not improving. But he is not getting any better.  Patient reports " there is talk about seeing a lung doctor for my breathing but I have been having stomach problems and bleeding so when I saw Dr Carlean Purl he immediately said I need to have a colonoscopy. My next colonoscopy was supposed to be in February but they said I need to have it now"  PA to review chart

## 2019-02-28 ENCOUNTER — Ambulatory Visit (HOSPITAL_COMMUNITY): Payer: Medicare Other | Admitting: Physician Assistant

## 2019-02-28 ENCOUNTER — Ambulatory Visit (HOSPITAL_COMMUNITY)
Admission: RE | Admit: 2019-02-28 | Discharge: 2019-02-28 | Disposition: A | Discharge status: Home / Self Care | Payer: Medicare Other | Attending: Internal Medicine | Admitting: Internal Medicine

## 2019-02-28 ENCOUNTER — Encounter (HOSPITAL_COMMUNITY)
Admission: RE | Disposition: A | Discharge status: Home / Self Care | Payer: Self-pay | Source: Home / Self Care | Attending: Internal Medicine

## 2019-02-28 ENCOUNTER — Other Ambulatory Visit: Payer: Self-pay

## 2019-02-28 DIAGNOSIS — I361 Nonrheumatic tricuspid (valve) insufficiency: Secondary | ICD-10-CM | POA: Diagnosis not present

## 2019-02-28 DIAGNOSIS — I5032 Chronic diastolic (congestive) heart failure: Secondary | ICD-10-CM | POA: Diagnosis not present

## 2019-02-28 DIAGNOSIS — K625 Hemorrhage of anus and rectum: Secondary | ICD-10-CM | POA: Diagnosis not present

## 2019-02-28 DIAGNOSIS — I422 Other hypertrophic cardiomyopathy: Secondary | ICD-10-CM | POA: Diagnosis not present

## 2019-02-28 DIAGNOSIS — Z79899 Other long term (current) drug therapy: Secondary | ICD-10-CM | POA: Diagnosis not present

## 2019-02-28 DIAGNOSIS — G4730 Sleep apnea, unspecified: Secondary | ICD-10-CM | POA: Diagnosis not present

## 2019-02-28 DIAGNOSIS — Z7982 Long term (current) use of aspirin: Secondary | ICD-10-CM | POA: Diagnosis not present

## 2019-02-28 DIAGNOSIS — Z8673 Personal history of transient ischemic attack (TIA), and cerebral infarction without residual deficits: Secondary | ICD-10-CM | POA: Insufficient documentation

## 2019-02-28 DIAGNOSIS — Z86010 Personal history of colonic polyps: Secondary | ICD-10-CM | POA: Diagnosis not present

## 2019-02-28 DIAGNOSIS — I4891 Unspecified atrial fibrillation: Secondary | ICD-10-CM | POA: Insufficient documentation

## 2019-02-28 DIAGNOSIS — I110 Hypertensive heart disease with heart failure: Secondary | ICD-10-CM | POA: Diagnosis not present

## 2019-02-28 DIAGNOSIS — Z952 Presence of prosthetic heart valve: Secondary | ICD-10-CM | POA: Diagnosis not present

## 2019-02-28 DIAGNOSIS — Z6841 Body Mass Index (BMI) 40.0 and over, adult: Secondary | ICD-10-CM | POA: Insufficient documentation

## 2019-02-28 DIAGNOSIS — M109 Gout, unspecified: Secondary | ICD-10-CM | POA: Insufficient documentation

## 2019-02-28 DIAGNOSIS — K649 Unspecified hemorrhoids: Secondary | ICD-10-CM | POA: Diagnosis not present

## 2019-02-28 DIAGNOSIS — R194 Change in bowel habit: Secondary | ICD-10-CM

## 2019-02-28 DIAGNOSIS — Z950 Presence of cardiac pacemaker: Secondary | ICD-10-CM | POA: Insufficient documentation

## 2019-02-28 DIAGNOSIS — Z8249 Family history of ischemic heart disease and other diseases of the circulatory system: Secondary | ICD-10-CM | POA: Diagnosis not present

## 2019-02-28 DIAGNOSIS — Z7901 Long term (current) use of anticoagulants: Secondary | ICD-10-CM | POA: Diagnosis not present

## 2019-02-28 DIAGNOSIS — K5730 Diverticulosis of large intestine without perforation or abscess without bleeding: Secondary | ICD-10-CM | POA: Insufficient documentation

## 2019-02-28 DIAGNOSIS — Z85828 Personal history of other malignant neoplasm of skin: Secondary | ICD-10-CM | POA: Insufficient documentation

## 2019-02-28 DIAGNOSIS — I272 Pulmonary hypertension, unspecified: Secondary | ICD-10-CM | POA: Diagnosis not present

## 2019-02-28 HISTORY — PX: COLONOSCOPY WITH PROPOFOL: SHX5780

## 2019-02-28 SURGERY — COLONOSCOPY WITH PROPOFOL
Anesthesia: Monitor Anesthesia Care

## 2019-02-28 MED ORDER — PROPOFOL 10 MG/ML IV BOLUS
INTRAVENOUS | Status: AC
  Filled 2019-02-28: qty 20

## 2019-02-28 MED ORDER — SODIUM CHLORIDE 0.9 % IV SOLN: INTRAVENOUS | Status: DC

## 2019-02-28 MED ORDER — PROPOFOL 500 MG/50ML IV EMUL
INTRAVENOUS | Status: DC | PRN
  Administered 2019-02-28: 80 ug/kg/min via INTRAVENOUS

## 2019-02-28 MED ORDER — LACTATED RINGERS IV SOLN
INTRAVENOUS | Status: DC
  Administered 2019-02-28: 12:00:00 via INTRAVENOUS

## 2019-02-28 MED ORDER — PROPOFOL 10 MG/ML IV BOLUS
INTRAVENOUS | Status: DC | PRN
  Administered 2019-02-28: 20 mg via INTRAVENOUS
  Administered 2019-02-28: 40 mg via INTRAVENOUS
  Administered 2019-02-28: 20 mg via INTRAVENOUS

## 2019-02-28 MED ORDER — PROPOFOL 10 MG/ML IV BOLUS
INTRAVENOUS | Status: AC
  Filled 2019-02-28: qty 40

## 2019-02-28 SURGICAL SUPPLY — 22 items

## 2019-02-28 NOTE — Anesthesia Postprocedure Evaluation (Signed)
Anesthesia Post Note  Patient: Frank [name]  Procedure(s) Performed: COLONOSCOPY WITH PROPOFOL (N/A )     Patient location during evaluation: PACU Anesthesia Type: MAC Level of consciousness: awake and alert Pain management: pain level controlled Vital Signs Assessment: post-procedure vital signs reviewed and stable Respiratory status: spontaneous breathing, nonlabored ventilation, respiratory function stable and patient connected to nasal cannula oxygen Cardiovascular status: stable and blood pressure returned to baseline Postop Assessment: no apparent nausea or vomiting Anesthetic complications: no    Last Vitals:  Vitals:   02/28/19 1400 02/28/19 1410  BP: (!) 89/51 (!) 119/50  Pulse: 61 60  Resp: 19 18  Temp:    SpO2: 94% 93%    Last Pain:  Vitals:   02/28/19 1347  TempSrc: Temporal  PainSc:                  Effie Berkshire

## 2019-02-28 NOTE — Discharge Instructions (Signed)
I saw hemorrhoids and diverticulosis - all else ok.  I am rechecking blood count and iron level today and will call.  I will let you know results and plans.  Take blood thinners as instructed by the anti-coagulation clinic.  I appreciate the opportunity to care for you. Gatha Mayer, MD, FACG   YOU HAD AN ENDOSCOPIC PROCEDURE TODAY: Refer to the procedure report and other information in the discharge instructions given to you for any specific questions about what was found during the examination. If this information does not answer your questions, please call Dr. Celesta Aver office at [phone] to clarify.   YOU SHOULD EXPECT: Some feelings of bloating in the abdomen. Passage of more gas than usual. Walking can help get rid of the air that was put into your GI tract during the procedure and reduce the bloating. If you had a lower endoscopy (such as a colonoscopy or flexible sigmoidoscopy) you may notice spotting of blood in your stool or on the toilet paper. Some abdominal soreness may be present for a day or two, also.  DIET: Your first meal following the procedure should be a light meal and then it is ok to progress to your normal diet. A half-sandwich or bowl of soup is an example of a good first meal. Heavy or fried foods are harder to digest and may make you feel nauseous or bloated. Drink plenty of fluids but you should avoid alcoholic beverages for 24 hours.   ACTIVITY: Your care partner should take you home directly after the procedure. You should plan to take it easy, moving slowly for the rest of the day. You can resume normal activity the day after the procedure however YOU SHOULD NOT DRIVE, use power tools, machinery or perform tasks that involve climbing or major physical exertion for 24 hours (because of the sedation medicines used during the test).   SYMPTOMS TO REPORT IMMEDIATELY: A gastroenterologist can be reached at any hour. Please call [phone]  for any of the  following symptoms:  Following lower endoscopy (colonoscopy, flexible sigmoidoscopy) Excessive amounts of blood in the stool  Significant tenderness, worsening of abdominal pains  Swelling of the abdomen that is new, acute  Fever of 100 or higher  Following upper endoscopy (EGD, EUS, ERCP, esophageal dilation) Vomiting of blood or coffee ground material  New, significant abdominal pain  New, significant chest pain or pain under the shoulder blades  Painful or persistently difficult swallowing  New shortness of breath  Black, tarry-looking or red, bloody stools  FOLLOW UP:  If any biopsies were taken you will be contacted by phone or by letter within the next 1-3 weeks. Call [phone]  if you have not heard about the biopsies in 3 weeks.  Please also call with any specific questions about appointments or follow up tests.

## 2019-02-28 NOTE — Transfer of Care (Signed)
Immediate Anesthesia Transfer of Care Note  Patient: Frank [name]  Procedure(s) Performed: Procedure(s): COLONOSCOPY WITH PROPOFOL (N/A)  Patient Location: PACU  Anesthesia Type:MAC  Level of Consciousness:  sedated, patient cooperative and responds to stimulation  Airway & Oxygen Therapy:Patient Spontanous Breathing and Patient connected to face mask oxgen  Post-op Assessment:  Report given to PACU RN and Post -op Vital signs reviewed and stable  Post vital signs:  Reviewed and stable  Last Vitals:  Vitals:   02/28/19 1156  BP: (!) 162/68  Pulse: 66  Resp: (!) 21  Temp: 36.4 C  SpO2: 57%    Complications: No apparent anesthesia complications

## 2019-02-28 NOTE — Brief Op Note (Signed)
02/28/2019  1:57 PM  PATIENT:  Frank [name]  73 y.o. male  PRE-OPERATIVE DIAGNOSIS:  hx of colon polpys, rectal bleeding, change in bowel habits  POST-OPERATIVE DIAGNOSIS:  diverticulosis  PROCEDURE:  Procedure(s): COLONOSCOPY WITH PROPOFOL (N/A)  SURGEON:  Surgeon(s) and Role:    * Gatha Mayer, MD - Primary    ANESTHESIA:   Deep sedation MAC   hemmorrhoids Sigmoid diverticulosis O/w NL  Adequate prep  CBC and ferritin today  Resume anticoags

## 2019-02-28 NOTE — Interval H&P Note (Signed)
History and Physical Interval Note:  02/28/2019 12:21 PM  Frank [name]  has presented today for surgery, with the diagnosis of hx of colon polpys, rectal bleeding, change in bowel habits.  The various methods of treatment have been discussed with the patient and family. After consideration of risks, benefits and other options for treatment, the patient has consented to  Procedure(s): COLONOSCOPY WITH PROPOFOL (N/A) as a surgical intervention.  The patient's history has been reviewed, patient examined, no change in status, stable for surgery.  I have reviewed the patient's chart and labs.  Questions were answered to the patient's satisfaction.     Silvano Rusk

## 2019-02-28 NOTE — Anesthesia Preprocedure Evaluation (Signed)
Anesthesia Evaluation  Patient identified by MRN, date of birth, ID band Patient awake    Reviewed: Allergy & Precautions, NPO status , Patient's Chart, lab work & pertinent test results  Airway Mallampati: I  TM Distance: >3 FB Neck ROM: Full    Dental  (+) Teeth Intact, Dental Advisory Given   Pulmonary sleep apnea , former smoker,    breath sounds clear to auscultation       Cardiovascular hypertension, Pt. on medications +CHF  + dysrhythmias Atrial Fibrillation + pacemaker  Rhythm:Regular Rate:Normal     Neuro/Psych  Headaches, TIAnegative psych ROS   GI/Hepatic negative GI ROS, Neg liver ROS,   Endo/Other  negative endocrine ROS  Renal/GU negative Renal ROS     Musculoskeletal   Abdominal (+) + obese,   Peds  Hematology negative hematology ROS (+)   Anesthesia Other Findings   Reproductive/Obstetrics                             Lab Results  Component Value Date   WBC 7.6 02/14/2019   HGB 9.4 (L) 02/14/2019   HCT 28.9 (L) 02/14/2019   MCV 96 02/14/2019   PLT 237 02/14/2019   Echo:  1. Left ventricular ejection fraction, by visual estimation, is 60 to 65%. The left ventricle has normal function. There is mildly increased left ventricular hypertrophy.  2. Left ventricular diastolic function could not be evaluated.  3. Global right ventricle has normal systolic function.The right ventricular size is normal. No increase in right ventricular wall thickness.  4. Left atrial size was normal.  5. Right atrial size was normal.  6. The mitral valve has been repaired/replaced. No evidence of mitral valve regurgitation. Moderate mitral stenosis.  7. The tricuspid valve is normal in structure. Tricuspid valve regurgitation moderate.  8. Aortic valve regurgitation is not visualized. Mild to moderate aortic valve stenosis.  9. There is Severe calcifcation of the aortic valve. 10. There is  Severely thickening of the aortic valve. 11. S/P Aortic valve repair. 12. The pulmonic valve was normal in structure. Pulmonic valve regurgitation is not visualized. 13. Moderately elevated pulmonary artery systolic pressure. 14. The tricuspid regurgitant velocity is 3.06 m/s, and with an assumed right atrial pressure of 10 mmHg, the estimated right ventricular systolic pressure is moderately elevated at 47.4 mmHg. 15. A pacer wire is visualized. 16. The inferior vena cava is normal in size with greater than 50% respiratory variability, suggesting   Anesthesia Physical Anesthesia Plan  ASA: III  Anesthesia Plan: MAC   Post-op Pain Management:    Induction: Intravenous  PONV Risk Score and Plan: 0 and Propofol infusion  Airway Management Planned: Natural Airway and Simple Face Mask  Additional Equipment: None  Intra-op Plan:   Post-operative Plan:   Informed Consent: I have reviewed the patients History and Physical, chart, labs and discussed the procedure including the risks, benefits and alternatives for the proposed anesthesia with the patient or authorized representative who has indicated his/her understanding and acceptance.       Plan Discussed with: CRNA  Anesthesia Plan Comments:         Anesthesia Quick Evaluation

## 2019-03-03 ENCOUNTER — Encounter (HOSPITAL_COMMUNITY): Payer: Self-pay | Admitting: Internal Medicine

## 2019-03-04 ENCOUNTER — Ambulatory Visit (INDEPENDENT_AMBULATORY_CARE_PROVIDER_SITE_OTHER): Payer: Medicare Other | Admitting: *Deleted

## 2019-03-04 DIAGNOSIS — Z950 Presence of cardiac pacemaker: Secondary | ICD-10-CM

## 2019-03-05 ENCOUNTER — Telehealth: Payer: Self-pay

## 2019-03-05 ENCOUNTER — Ambulatory Visit (INDEPENDENT_AMBULATORY_CARE_PROVIDER_SITE_OTHER): Payer: Medicare Other

## 2019-03-05 ENCOUNTER — Other Ambulatory Visit: Payer: Self-pay

## 2019-03-05 DIAGNOSIS — Z5181 Encounter for therapeutic drug level monitoring: Secondary | ICD-10-CM | POA: Diagnosis not present

## 2019-03-05 DIAGNOSIS — Z9889 Other specified postprocedural states: Secondary | ICD-10-CM | POA: Diagnosis not present

## 2019-03-05 DIAGNOSIS — G459 Transient cerebral ischemic attack, unspecified: Secondary | ICD-10-CM

## 2019-03-05 DIAGNOSIS — I4891 Unspecified atrial fibrillation: Secondary | ICD-10-CM

## 2019-03-05 LAB — POCT INR: INR: 2.4 (ref 2.0–3.0)

## 2019-03-05 NOTE — Telephone Encounter (Signed)
Spoke with patient to remind of missed remote transmission BM

## 2019-03-05 NOTE — Patient Instructions (Signed)
Description   Continue on same dosage 10mg  daily except 15mg  on Sundays and Wednesdays.  Recheck in 2 weeks.  Call with any concerns any new medications or if scheduled for any procedures  [phone]

## 2019-03-06 LAB — CUP PACEART REMOTE DEVICE CHECK
Battery Remaining Longevity: 75 mo
Battery Remaining Percentage: 95.5 %
Battery Voltage: 2.98 V
Brady Statistic AP VP Percent: 96 %
Brady Statistic AP VS Percent: 1 %
Brady Statistic AS VP Percent: 2.6 %
Brady Statistic AS VS Percent: 1 %
Brady Statistic RA Percent Paced: 96 %
Date Time Interrogation Session: 20201126051221
Implantable Lead Implant Date: 20190419
Implantable Lead Implant Date: 20190419
Implantable Lead Implant Date: 20190419
Implantable Lead Location: 753858
Implantable Lead Location: 753859
Implantable Lead Location: 753860
Implantable Lead Model: 5076
Implantable Lead Model: 5076
Implantable Pulse Generator Implant Date: 20190419
Lead Channel Impedance Value: 380 Ohm
Lead Channel Impedance Value: 400 Ohm
Lead Channel Impedance Value: 440 Ohm
Lead Channel Pacing Threshold Amplitude: 0.5 V
Lead Channel Pacing Threshold Amplitude: 0.625 V
Lead Channel Pacing Threshold Amplitude: 1 V
Lead Channel Pacing Threshold Pulse Width: 0.5 ms
Lead Channel Pacing Threshold Pulse Width: 0.5 ms
Lead Channel Pacing Threshold Pulse Width: 1 ms
Lead Channel Sensing Intrinsic Amplitude: 0.4 mV
Lead Channel Sensing Intrinsic Amplitude: 9.4 mV
Lead Channel Setting Pacing Amplitude: 2 V
Lead Channel Setting Pacing Amplitude: 2 V
Lead Channel Setting Pacing Amplitude: 2.5 V
Lead Channel Setting Pacing Pulse Width: 0.5 ms
Lead Channel Setting Pacing Pulse Width: 1 ms
Lead Channel Setting Sensing Sensitivity: 2 mV
Pulse Gen Model: 3262
Pulse Gen Serial Number: 9008098

## 2019-03-10 ENCOUNTER — Telehealth: Payer: Self-pay | Admitting: Internal Medicine

## 2019-03-10 DIAGNOSIS — K625 Hemorrhage of anus and rectum: Secondary | ICD-10-CM

## 2019-03-10 NOTE — Telephone Encounter (Signed)
Patient notified of recommendations He will come for labs

## 2019-03-10 NOTE — Telephone Encounter (Signed)
Dr. Carlean Purl.  I can't pull the procedure up in Epic.  He reports continued rectal bleeding and was waiting for the next steps.  Please advise

## 2019-03-10 NOTE — Op Note (Signed)
[facility] Patient Name: Frank [name] Procedure Date: 02/28/2019 MRN: [id number] Attending MD: Gatha Mayer , MD Date of Birth: 1945/09/09 CSN: [id number] Age: 73 Admit Type: Inpatient Procedure:                Colonoscopy Indications:              Rectal bleeding, aNEMIA Providers:                Gatha Mayer, MD, Burtis Junes, RN, Lina Sar,                            Technician, Anne Fu CRNA, CRNA Referring MD:              Medicines:                Propofol per Anesthesia, Monitored Anesthesia Care Complications:            No immediate complications. Estimated Blood Loss:     Estimated blood loss: none. Procedure:                Pre-Anesthesia Assessment:                           - Prior to the procedure, a History and Physical                            was performed, and patient medications and                            allergies were reviewed. The patient's tolerance of                            previous anesthesia was also reviewed. The risks                            and benefits of the procedure and the sedation                            options and risks were discussed with the patient.                            All questions were answered, and informed consent                            was obtained. Prior Anticoagulants: The patient                            last took Lovenox (enoxaparin) 1 day prior to the                            procedure. ASA Grade Assessment: IV - A patient                            with severe systemic disease that is a constant  threat to life. After reviewing the risks and                            benefits, the patient was deemed in satisfactory                            condition to undergo the procedure.                           After obtaining informed consent, the colonoscope                            was passed under direct vision. Throughout the            procedure, the patient's blood pressure, pulse, and                            oxygen saturations were monitored continuously. The                            [id number]-HQ190L ([id number]) Olympus colonoscope was                            introduced through the anus and advanced to the the                            cecum, identified by appendiceal orifice and                            ileocecal valve. The colonoscopy was performed                            without difficulty. The patient tolerated the                            procedure well. The quality of the bowel                            preparation was adequate. The ileocecal valve,                            appendiceal orifice, and rectum were photographed. Scope In: 1:26:48 PM Scope Out: 1:39:29 PM Scope Withdrawal Time: 0 hours 9 minutes 34 seconds  Total Procedure Duration: 0 hours 12 minutes 41 seconds  Findings:      The perianal and digital rectal examinations were normal.      External and internal hemorrhoids were found.      Multiple diverticula were found in the left colon.      The terminal ileum appeared normal.      The exam was otherwise without abnormality on direct and retroflexion       views. Impression:               - External and internal hemorrhoids -- beleive this  is source of bleeding and anemia                           - Diverticulosis in the left colon.                           - The examination was otherwise normal on direct                            and retroflexion views.                           - No specimens collected. Moderate Sedation:      Not Applicable - Patient had care per Anesthesia. Recommendation:           - Patient has a contact number available for                            emergencies. The signs and symptoms of potential                            delayed complications were discussed with the                            patient. Return to normal  activities tomorrow.                            Written discharge instructions were provided to the                            patient.                           - Resume previous diet.                           - Continue present medications.                           - Resume Lovenox (enoxaparin) at prior dose                            tomorrow. Refer to [facility] for further                            adjustment of therapy. Procedure Code(s):        --- Professional ---                           [phone], Colonoscopy, flexible; diagnostic, including                            collection of specimen(s) by brushing or washing,                            when performed (separate procedure) Diagnosis Code(s):        ---  Professional ---                           K64.8, Other hemorrhoids                           K62.5, Hemorrhage of anus and rectum                           K57.30, Diverticulosis of large intestine without                            perforation or abscess without bleeding CPT copyright 2019 [employer]. All rights reserved. The codes documented in this report are preliminary and upon coder review may  be revised to meet current compliance requirements. Gatha Mayer, MD 03/10/2019 3:04:08 PM This report has been signed electronically. Number of Addenda: 0

## 2019-03-10 NOTE — Telephone Encounter (Signed)
I did a brief op note that isn't easy to see - hemorrhoids and diverticulosis  I will do a full report   He was supposed to have a CBC and ferritin checked but does not look like it got done at the hospital  I need him to come do that  It is my sense he is bleeding from hemorrhoids, exacerbated by anti-coagulation

## 2019-03-12 ENCOUNTER — Other Ambulatory Visit (INDEPENDENT_AMBULATORY_CARE_PROVIDER_SITE_OTHER): Payer: Medicare Other

## 2019-03-12 DIAGNOSIS — K625 Hemorrhage of anus and rectum: Secondary | ICD-10-CM

## 2019-03-12 LAB — FERRITIN: Ferritin: 30.1 ng/mL (ref 22.0–322.0)

## 2019-03-12 LAB — CBC
HCT: 26.9 % — ABNORMAL LOW (ref 39.0–52.0)
Hemoglobin: 8.9 g/dL — ABNORMAL LOW (ref 13.0–17.0)
MCHC: 33.1 g/dL (ref 30.0–36.0)
MCV: 95.6 fl (ref 78.0–100.0)
Platelets: 242 10*3/uL (ref 150.0–400.0)
RBC: 2.81 Mil/uL — ABNORMAL LOW (ref 4.22–5.81)
RDW: 17.6 % — ABNORMAL HIGH (ref 11.5–15.5)
WBC: 7.2 10*3/uL (ref 4.0–10.5)

## 2019-03-13 ENCOUNTER — Other Ambulatory Visit: Payer: Self-pay | Admitting: Internal Medicine

## 2019-03-13 DIAGNOSIS — D649 Anemia, unspecified: Secondary | ICD-10-CM

## 2019-03-13 DIAGNOSIS — D518 Other vitamin B12 deficiency anemias: Secondary | ICD-10-CM

## 2019-03-16 ENCOUNTER — Emergency Department (HOSPITAL_COMMUNITY): Payer: Medicare Other

## 2019-03-16 ENCOUNTER — Inpatient Hospital Stay (HOSPITAL_COMMUNITY)
Admission: EM | Admit: 2019-03-16 | Discharge: 2019-03-31 | DRG: 871 | Disposition: A | Discharge status: Home Health | Payer: Medicare Other | Attending: Internal Medicine | Admitting: Internal Medicine

## 2019-03-16 DIAGNOSIS — Z8673 Personal history of transient ischemic attack (TIA), and cerebral infarction without residual deficits: Secondary | ICD-10-CM

## 2019-03-16 DIAGNOSIS — L03119 Cellulitis of unspecified part of limb: Secondary | ICD-10-CM

## 2019-03-16 DIAGNOSIS — K648 Other hemorrhoids: Secondary | ICD-10-CM | POA: Diagnosis present

## 2019-03-16 DIAGNOSIS — I959 Hypotension, unspecified: Secondary | ICD-10-CM | POA: Diagnosis not present

## 2019-03-16 DIAGNOSIS — A408 Other streptococcal sepsis: Principal | ICD-10-CM | POA: Diagnosis present

## 2019-03-16 DIAGNOSIS — Z952 Presence of prosthetic heart valve: Secondary | ICD-10-CM

## 2019-03-16 DIAGNOSIS — E8809 Other disorders of plasma-protein metabolism, not elsewhere classified: Secondary | ICD-10-CM | POA: Diagnosis not present

## 2019-03-16 DIAGNOSIS — I509 Heart failure, unspecified: Secondary | ICD-10-CM

## 2019-03-16 DIAGNOSIS — K644 Residual hemorrhoidal skin tags: Secondary | ICD-10-CM | POA: Diagnosis present

## 2019-03-16 DIAGNOSIS — E860 Dehydration: Secondary | ICD-10-CM | POA: Diagnosis present

## 2019-03-16 DIAGNOSIS — I480 Paroxysmal atrial fibrillation: Secondary | ICD-10-CM | POA: Diagnosis present

## 2019-03-16 DIAGNOSIS — Z9889 Other specified postprocedural states: Secondary | ICD-10-CM

## 2019-03-16 DIAGNOSIS — R6520 Severe sepsis without septic shock: Secondary | ICD-10-CM | POA: Diagnosis not present

## 2019-03-16 DIAGNOSIS — Z7982 Long term (current) use of aspirin: Secondary | ICD-10-CM

## 2019-03-16 DIAGNOSIS — B954 Other streptococcus as the cause of diseases classified elsewhere: Secondary | ICD-10-CM | POA: Diagnosis present

## 2019-03-16 DIAGNOSIS — Z6841 Body Mass Index (BMI) 40.0 and over, adult: Secondary | ICD-10-CM

## 2019-03-16 DIAGNOSIS — Z20828 Contact with and (suspected) exposure to other viral communicable diseases: Secondary | ICD-10-CM | POA: Diagnosis present

## 2019-03-16 DIAGNOSIS — I442 Atrioventricular block, complete: Secondary | ICD-10-CM | POA: Diagnosis not present

## 2019-03-16 DIAGNOSIS — R58 Hemorrhage, not elsewhere classified: Secondary | ICD-10-CM | POA: Diagnosis not present

## 2019-03-16 DIAGNOSIS — K5900 Constipation, unspecified: Secondary | ICD-10-CM | POA: Diagnosis not present

## 2019-03-16 DIAGNOSIS — J9601 Acute respiratory failure with hypoxia: Secondary | ICD-10-CM | POA: Diagnosis not present

## 2019-03-16 DIAGNOSIS — I5043 Acute on chronic combined systolic (congestive) and diastolic (congestive) heart failure: Secondary | ICD-10-CM | POA: Diagnosis present

## 2019-03-16 DIAGNOSIS — E871 Hypo-osmolality and hyponatremia: Secondary | ICD-10-CM | POA: Diagnosis not present

## 2019-03-16 DIAGNOSIS — I447 Left bundle-branch block, unspecified: Secondary | ICD-10-CM | POA: Diagnosis not present

## 2019-03-16 DIAGNOSIS — Z950 Presence of cardiac pacemaker: Secondary | ICD-10-CM

## 2019-03-16 DIAGNOSIS — I872 Venous insufficiency (chronic) (peripheral): Secondary | ICD-10-CM | POA: Diagnosis present

## 2019-03-16 DIAGNOSIS — K625 Hemorrhage of anus and rectum: Secondary | ICD-10-CM

## 2019-03-16 DIAGNOSIS — E876 Hypokalemia: Secondary | ICD-10-CM | POA: Diagnosis not present

## 2019-03-16 DIAGNOSIS — Z87891 Personal history of nicotine dependence: Secondary | ICD-10-CM

## 2019-03-16 DIAGNOSIS — D689 Coagulation defect, unspecified: Secondary | ICD-10-CM

## 2019-03-16 DIAGNOSIS — I130 Hypertensive heart and chronic kidney disease with heart failure and stage 1 through stage 4 chronic kidney disease, or unspecified chronic kidney disease: Secondary | ICD-10-CM | POA: Diagnosis present

## 2019-03-16 DIAGNOSIS — Z79899 Other long term (current) drug therapy: Secondary | ICD-10-CM

## 2019-03-16 DIAGNOSIS — N179 Acute kidney failure, unspecified: Secondary | ICD-10-CM | POA: Diagnosis not present

## 2019-03-16 DIAGNOSIS — E875 Hyperkalemia: Secondary | ICD-10-CM | POA: Diagnosis not present

## 2019-03-16 DIAGNOSIS — A419 Sepsis, unspecified organism: Secondary | ICD-10-CM | POA: Diagnosis present

## 2019-03-16 DIAGNOSIS — R531 Weakness: Secondary | ICD-10-CM | POA: Diagnosis not present

## 2019-03-16 DIAGNOSIS — R791 Abnormal coagulation profile: Secondary | ICD-10-CM | POA: Diagnosis present

## 2019-03-16 DIAGNOSIS — Z8349 Family history of other endocrine, nutritional and metabolic diseases: Secondary | ICD-10-CM

## 2019-03-16 DIAGNOSIS — Z8379 Family history of other diseases of the digestive system: Secondary | ICD-10-CM

## 2019-03-16 DIAGNOSIS — I5042 Chronic combined systolic (congestive) and diastolic (congestive) heart failure: Secondary | ICD-10-CM | POA: Diagnosis present

## 2019-03-16 DIAGNOSIS — N183 Chronic kidney disease, stage 3 unspecified: Secondary | ICD-10-CM | POA: Diagnosis present

## 2019-03-16 DIAGNOSIS — E662 Morbid (severe) obesity with alveolar hypoventilation: Secondary | ICD-10-CM | POA: Diagnosis present

## 2019-03-16 DIAGNOSIS — L03115 Cellulitis of right lower limb: Secondary | ICD-10-CM | POA: Diagnosis present

## 2019-03-16 DIAGNOSIS — R7881 Bacteremia: Secondary | ICD-10-CM

## 2019-03-16 DIAGNOSIS — N390 Urinary tract infection, site not specified: Secondary | ICD-10-CM

## 2019-03-16 DIAGNOSIS — K5730 Diverticulosis of large intestine without perforation or abscess without bleeding: Secondary | ICD-10-CM | POA: Diagnosis present

## 2019-03-16 DIAGNOSIS — N3000 Acute cystitis without hematuria: Secondary | ICD-10-CM

## 2019-03-16 DIAGNOSIS — Z800 Family history of malignant neoplasm of digestive organs: Secondary | ICD-10-CM

## 2019-03-16 DIAGNOSIS — R0902 Hypoxemia: Secondary | ICD-10-CM | POA: Diagnosis not present

## 2019-03-16 DIAGNOSIS — Z8049 Family history of malignant neoplasm of other genital organs: Secondary | ICD-10-CM

## 2019-03-16 DIAGNOSIS — I082 Rheumatic disorders of both aortic and tricuspid valves: Secondary | ICD-10-CM | POA: Diagnosis present

## 2019-03-16 DIAGNOSIS — J810 Acute pulmonary edema: Secondary | ICD-10-CM

## 2019-03-16 DIAGNOSIS — Z7901 Long term (current) use of anticoagulants: Secondary | ICD-10-CM

## 2019-03-16 DIAGNOSIS — D509 Iron deficiency anemia, unspecified: Secondary | ICD-10-CM | POA: Diagnosis present

## 2019-03-16 DIAGNOSIS — Z8249 Family history of ischemic heart disease and other diseases of the circulatory system: Secondary | ICD-10-CM

## 2019-03-16 DIAGNOSIS — R040 Epistaxis: Secondary | ICD-10-CM | POA: Diagnosis present

## 2019-03-16 DIAGNOSIS — Z888 Allergy status to other drugs, medicaments and biological substances status: Secondary | ICD-10-CM

## 2019-03-16 DIAGNOSIS — I5082 Biventricular heart failure: Secondary | ICD-10-CM | POA: Diagnosis present

## 2019-03-16 DIAGNOSIS — R238 Other skin changes: Secondary | ICD-10-CM

## 2019-03-16 MED ORDER — ACETAMINOPHEN 325 MG PO TABS: 650.0000 mg | ORAL_TABLET | Freq: Once | ORAL | Status: DC

## 2019-03-16 NOTE — ED Triage Notes (Signed)
Pt arrives via [facility]. Initial call out was for weakness, additionally that the patient has been having rectal bleeding (colonoscopy two weeks ago). Pt reporting he has had increasing SOB, cough. Denies fever, temp for ems 100. Denies sick contacts.

## 2019-03-16 NOTE — ED Provider Notes (Signed)
[facility] EMERGENCY DEPARTMENT Provider Note   CSN: [id number] Arrival date & time: 03/16/19  2327     History   Chief Complaint Chief Complaint  Patient presents with  . Weakness    HPI Frank [name] is a 73 y.o. male.     The history is provided by the patient and the EMS personnel.  Weakness Severity:  Severe Onset quality:  Gradual Timing:  Constant Progression:  Worsening Chronicity:  New Relieved by:  Nothing Worsened by:  Nothing Associated symptoms: arthralgias, cough, diarrhea, fever, hematochezia and shortness of breath   Associated symptoms: no chest pain and no vomiting   Patient presents from home.  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  EMS reports they were called out for weakness and also reportedly patient's been having rectal bleeding.  Patient also admits to increasing shortness of breath and cough.  EMS reports temperature was 100.8. Patient reports chronic lower extremity edema and pain that is unchanged Past Medical History:  Diagnosis Date  . Atrial arrhythmia    status post ablation in [place] with complication including damage to the aortic valve  . Atrial fibrillation ([place])   . AV block, 1st degree   . Bradycardia   . Cluster headache ~ 1980   "went away after I started taking one of the heart RXs" (06/29/2017)  . Diastolic CHF, chronic ([place])   . Diverticulosis   . History of gout   . HTN (hypertension)   . Hypertrophic cardiomyopathy ([place])    s/p myoectomy  . Infection   . Internal hemorrhoids   . Morbid obesity ([place])   . Morbid obesity with BMI of 45.0-49.9, adult ([place])   . Pacemaker  -SJM   . Skin cancer    "burned off RUE X 1; cut off nose X 2" (06/29/2017)  . TIA (transient ischemic attack) 2001  . Tubulovillous adenoma     Patient Active Problem List   Diagnosis Date  Noted  . Change in bowel habits   . Rectal bleeding   . Pulmonary hypertension ([place]) 12/17/2017  . Complete heart block ([place]) 07/28/2017  . OSA (obstructive sleep apnea) 06/08/2017  . NICM (nonischemic cardiomyopathy) ([place]) 05/28/2017  . Chronic combined systolic and diastolic heart failure ([place]) 01/18/2017  . Dyspnea on exertion 07/12/2015  . Encounter for therapeutic drug monitoring 05/19/2013  . Cardiac pacemaker-St.Jude 06/22/2011  . S/P mitral valve replacement-Mechanical 10/10/2010  . Atrial fibrillation/tachycardia 06/08/2010  . TIA (transient ischemic attack) 06/08/2010  . S/P aortic valve repair 06/08/2010  . DIASTOLIC HEART FAILURE, CHRONIC 08/10/2009  . VENTRICULAR TACHYCARDIA 11/23/2008  . MORBID OBESITY 06/10/2007  . Essential hypertension 06/10/2007  . Hypertrophic obstructive cardiomyopathy ([place]) 06/10/2007  . DYSPNEA 06/10/2007  . CT, CHEST, ABNORMAL 06/10/2007  . MELANOMA, HX OF 06/10/2007    Past Surgical History:  Procedure Laterality Date  . 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 BIV PACEMAKER INSERTION CRT-P N/A 07/27/2017   Procedure: BIV PACEMAKER INSERTION CRT-P;  Surgeon: [name], MD;  Location: [facility];  Service: Cardiovascular;  Laterality: N/A;  . BIV UPGRADE N/A 05/28/2017   Procedure: BIV [place];  Surgeon: [name], MD;  Location: [facility];  Service: Cardiovascular;  Laterality: N/A;  . CARDIAC CATHETERIZATION     "couple times" (06/29/2017)  . CARDIAC VALVE REPLACEMENT    .  COLONOSCOPY    . COLONOSCOPY WITH PROPOFOL N/A 04/13/2016   Procedure: COLONOSCOPY WITH PROPOFOL;  Surgeon: Gatha Mayer, MD;  Location: [facility];  Service: Endoscopy;  Laterality: N/A;  . COLONOSCOPY WITH PROPOFOL N/A 02/28/2019   Procedure: COLONOSCOPY WITH PROPOFOL;  Surgeon: Gatha Mayer, MD;  Location: [facility];  Service: Endoscopy;  Laterality: N/A;  . INSERT /  REPLACE / REMOVE PACEMAKER  ~ [address] [employer]   . 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 Jude  . PACEMAKER LEAD REMOVAL N/A 06/29/2017   Procedure: [id number]  REMOVAL AND Four LEAD REMOVAL;  Surgeon: Evans Lance, MD;  Location: [facility];  Service: Cardiovascular;  Laterality: N/A;  . PACEMAKER REMOVAL  06/29/2017   GERNERATOR  REMOVAL AND Four LEAD REMOVAL; put in external pacemaker (06/29/2017)  . septal myectomy  07/22/1997  . SKIN CANCER EXCISION          Home Medications    Prior to Admission medications   Medication Sig Start Date End Date Taking? Authorizing Provider  acetaminophen (TYLENOL) 325 MG tablet Take 2 tablets (650 mg total) by mouth every 6 (six) hours as needed for mild pain. 07/28/17   [facility], Tami Lin, PA  Ascorbic Acid (VITAMIN C) 1000 MG tablet Take 2,000 mg by mouth daily.      [provider]  aspirin 81 MG tablet Take 81 mg by mouth daily.    [provider]  diphenhydramine-acetaminophen (TYLENOL PM) 25-500 MG TABS tablet Take 2 tablets by mouth at bedtime as needed (for sleep).    [provider]  fish oil-omega-3 fatty acids 1000 MG capsule Take 4 g by mouth every morning.     [provider]  JANTOVEN 10 MG tablet Take as directed by [facility]madin Clinic Patient taking differently: Take 10-15 mg by mouth See admin instructions. Take as directed by Coumadin Clinic; 15 mg on Sun and Wed; 10 mg all other days 12/20/18   [name], MD  losartan (COZAAR) 50 MG tablet Take 1 tablet (50 mg total) by mouth daily. 02/21/19 05/22/19  [name], MD  Magnesium 250 MG TABS Take 500-750 mg by mouth See admin instructions. 750 mg in the morning, 500 mg in the evening    [provider]  metolazone (ZAROXOLYN) 5 MG tablet Take 1 tablet (5 mg total) by mouth See admin instructions. Once weekly if needed for swelling 02/21/19   [name], MD  Multiple Vitamin (MULTIVITAMIN) tablet Take 1 tablet by  mouth daily.      [provider]  potassium chloride (KLOR-CON) 10 MEQ tablet Take 13 tablets (130 mEq total) by mouth See admin instructions. Patient takes 13 tablets throughout the day Take 146mEq on days you take Metolazone 02/21/19   [name], MD  spironolactone (ALDACTONE) 25 MG tablet Take 1 tablet (25 mg total) by mouth daily. 02/21/19   [name], MD  torsemide (DEMADEX) 20 MG tablet Take 3 tablets (60 mg total) by mouth 2 (two) times daily. 02/21/19   [name], MD  enoxaparin (LOVENOX) 150 MG/ML injection Inject 1 mL (150 mg total) into the skin every 12 (twelve) hours. 02/20/19 03/16/19  [name], MD  fondaparinux (ARIXTRA) 10 MG/0.8ML SOLN injection Inject 0.8 mLs (10 mg total) into the skin daily. 02/20/19 03/16/19  [name], MD    Family History Family History  Problem Relation Age of Onset  . Heart disease  Sister   . Uterine cancer Sister   . Obesity Sister        500+ lbs  . Heart disease Brother   . Heart disease Sister   . Heart disease Father   . Colon cancer Father 86  . Crohn's disease Paternal Grandmother     Social History Social History   Tobacco Use  . Smoking status: Former Smoker    Years: 10.00    Types: Cigarettes, Pipe, Cigars    Quit date: 04/10/1977    Years since quitting: 41.9  . Smokeless tobacco: Never Used  Substance Use Topics  . Alcohol use: Yes    Alcohol/week: 2.0 standard drinks    Types: 1 Glasses of wine, 1 Cans of beer per week  . Drug use: No     Allergies   Warfarin and related   Review of Systems Review of Systems  Constitutional: Positive for fatigue and fever.  Respiratory: Positive for cough and shortness of breath.   Cardiovascular: Negative for chest pain.  Gastrointestinal: Positive for blood in stool, diarrhea and hematochezia. Negative for vomiting.  Musculoskeletal: Positive for arthralgias.  Neurological: Positive for weakness.  All other systems reviewed and are  negative.    Physical Exam Updated Vital Signs Pulse 73   Temp (!) 103 F (39.4 C) (Oral)   Resp (!) 22   SpO2 (!) 86%   Physical Exam CONSTITUTIONAL: Chronically ill-appearing HEAD: Normocephalic/atraumatic EYES: EOMI ENMT: Mask in place NECK: supple no meningeal signs CV: S1/S2 noted, murmur noted LUNGS: Tachypneic, coarse breath sounds bilaterally, hypoxia on their ABDOMEN: soft, nontender, no rebound or guarding, bowel sounds noted throughout abdomen NEURO: Pt is awake/alert/appropriate, moves all extremitiesx4.  No facial droop.   EXTREMITIES: pulses normal/equal, full ROM all, chronic lymphedema of the lower extremities, patient reports it is unchanged in appearance.  Mild tenderness to right leg but no crepitus, no deformity.  Distal pulses intact SKIN: warm, color normal PSYCH: Anxious       ED Treatments / Results  Labs (all labs ordered are listed, but only abnormal results are displayed) Labs Reviewed  BASIC METABOLIC PANEL - Abnormal; Notable for the following components:      Result Value   Glucose, Bld 100 (*)    BUN 54 (*)    Creatinine, Ser 2.54 (*)    GFR calc non Af Amer 25 (*)    GFR calc Af Amer 29 (*)    All other components within normal limits  CBC WITH DIFFERENTIAL/PLATELET - Abnormal; Notable for the following components:   WBC 29.8 (*)    RBC 2.82 (*)    Hemoglobin 8.9 (*)    HCT 28.7 (*)    MCV 101.8 (*)    RDW 17.0 (*)    Neutro Abs 26.6 (*)    Monocytes Absolute 2.0 (*)    Abs Immature Granulocytes 0.53 (*)    All other components within normal limits  PROTIME-INR - Abnormal; Notable for the following components:   Prothrombin Time 44.4 (*)    INR 4.7 (*)    All other components within normal limits  LACTIC ACID, PLASMA - Abnormal; Notable for the following components:   Lactic Acid, Venous 2.8 (*)    All other components within normal limits  POC OCCULT BLOOD, ED - Abnormal; Notable for the following components:   Fecal  Occult Bld POSITIVE (*)    All other components within normal limits  URINE CULTURE  CULTURE, BLOOD (ROUTINE X 2)  CULTURE,  BLOOD (ROUTINE X 2)  SARS CORONAVIRUS 2 (TAT 6-24 HRS)  LACTIC ACID, PLASMA  URINALYSIS, ROUTINE W REFLEX MICROSCOPIC  BRAIN NATRIURETIC PEPTIDE  POC SARS CORONAVIRUS 2 AG -  ED  TYPE AND SCREEN    EKG EKG Interpretation  Date/Time:  Monday March 17 2019 00:05:09 EST Ventricular Rate:  47 PR Interval:    QRS Duration: 149 QT Interval:  458 QTC Calculation: 360 R Axis:   107 Text Interpretation: Sinus bradycardia Paired ventricular premature complexes Borderline short PR interval Biatrial enlargement Nonspecific intraventricular conduction delay Anterolateral infarct, recent Confirmed by Ripley Fraise [phone]) on 03/17/2019 12:17:21 AM   Radiology Dg Chest Port 1 View  Result Date: 03/16/2019 CLINICAL DATA:  Shortness of breath EXAM: PORTABLE CHEST 1 VIEW COMPARISON:  July 10, 2018 FINDINGS: The heart size is significantly enlarged. The patient is status post prior median sternotomy. Aortic calcifications are noted. There is a multi lead right-sided pacemaker in place. There is vascular congestion without overt pulmonary edema. There is no pneumothorax or large pleural effusion. There is no acute osseous abnormality. IMPRESSION: Cardiomegaly with mild volume overload. Electronically Signed   By: Constance Holster M.D.   On: 03/16/2019 23:50    Procedures .Critical Care Performed by: Ripley Fraise, MD Authorized by: Ripley Fraise, MD   Critical care provider statement:    Critical care time (minutes):  43   Critical care start time:  03/16/2019 11:47 PM   Critical care end time:  03/17/2019 12:30 AM   Critical care time was exclusive of:  Separately billable procedures and treating other patients   Critical care was necessary to treat or prevent imminent or life-threatening deterioration of the following conditions:  Sepsis, respiratory failure  and shock   Critical care was time spent personally by me on the following activities:  Pulse oximetry, re-evaluation of patient's condition, review of old charts, ordering and review of radiographic studies, ordering and review of laboratory studies, ordering and performing treatments and interventions, evaluation of patient's response to treatment, discussions with consultants, development of treatment plan with patient or surrogate and examination of patient   I assumed direction of critical care for this patient from another provider in my specialty: no        Medications Ordered in ED Medications  cefTRIAXone (ROCEPHIN) 1 g in sodium chloride 0.9 % 100 mL IVPB (1 g Intravenous New Bag/Given 03/17/19 0137)  fentaNYL (SUBLIMAZE) injection 100 mcg (100 mcg Intravenous Given 03/17/19 0125)     Initial Impression / Assessment and Plan / ED Course  I have reviewed the triage vital signs and the nursing notes.  Pertinent labs & imaging results that were available during my care of the patient were reviewed by me and considered in my medical decision making (see chart for details).        11:46 PM Patient chronically ill here reporting increasing weakness and fatigue.  He also reports recent bloody diarrhea.  Patient had a colonoscopy last month that revealed diverticula and hemorrhoids that were likely source of bleeding.  Patient is on anticoagulation.  Patient also noted to be febrile and hypoxic.  He will be a Covid rule out We will follow patient closely 12:18 AM Spoke to wife via phone.  She reports he has been at his baseline but has been dealing with rectal bleeding.  She reports that his change became abrupt this evening he appeared more confused and had generalized weakness.  She was unaware that he had a fever She confirms  that the swelling/erythema in legs is NOT changed or new 12:23 AM Hemoglobin appears stable since December 2. Initial COVID-19 test is negative, will do  confirmatory test. Awaiting urinalysis. 12:50 AM Patient's INR is above 4, but his goal is up to 3.5 due to mechanical valve. PT has had rectal bleeding, it was felt to be due to his hemorrhoids and he has not had any significant active blood loss at this time.  I will not actively reverse the INR at this time Patient's hypoxia likely due to volume overload on chest x-ray.  Lactate less than 3.  Will defer IV fluids at this time.  He will need urinalysis testing  1:55 AM Urinalysis confirms UTI.  Rocephin has been ordered.  Fentanyl has been given for his chronic right leg pain though this did drop his blood pressure Discussed with Dr. Marlowe Sax for admission.  Patient will likely be admitted to stepdown.  If his blood pressure improves we will give Lasix for his edema  Patient has significant complicating medical conditions including pulmonary edema, UTI, GI bleed with stable hemoglobin on Coumadin with an elevated INR, acute kidney injury Initial plan will be to treat UTI with antibiotics and if blood pressure improves will perform diuresis with Lasix We will avoid IV fluids for now but lactate will need to be monitored Wife was informed of these findings via phone.  Frank [name] was evaluated in Emergency Department on 03/16/2019 for the symptoms described in the history of present illness. He was evaluated in the context of the global COVID-19 pandemic, which necessitated consideration that the patient might be at risk for infection with the SARS-CoV-2 virus that causes COVID-19. Institutional protocols and algorithms that pertain to the evaluation of patients at risk for COVID-19 are in a state of rapid change based on information released by regulatory bodies including the CDC and federal and state organizations. These policies and algorithms were followed during the patient's care in the ED.  Final Clinical Impressions(s) / ED Diagnoses   Final diagnoses:  Weakness  AKI (acute kidney  injury) ([place])  Acute pulmonary edema ([place])  Coagulopathy ([place])  Acute cystitis without hematuria  Gastrointestinal hemorrhage associated with anorectal source    ED Discharge Orders    None       Ripley Fraise, MD 03/17/19 [phone]

## 2019-03-17 ENCOUNTER — Other Ambulatory Visit: Payer: Self-pay

## 2019-03-17 ENCOUNTER — Encounter (HOSPITAL_COMMUNITY): Payer: Self-pay | Admitting: General Practice

## 2019-03-17 ENCOUNTER — Inpatient Hospital Stay (HOSPITAL_COMMUNITY): Payer: Medicare Other

## 2019-03-17 DIAGNOSIS — K921 Melena: Secondary | ICD-10-CM | POA: Diagnosis not present

## 2019-03-17 DIAGNOSIS — L03115 Cellulitis of right lower limb: Secondary | ICD-10-CM | POA: Diagnosis not present

## 2019-03-17 DIAGNOSIS — R509 Fever, unspecified: Secondary | ICD-10-CM | POA: Diagnosis not present

## 2019-03-17 DIAGNOSIS — I509 Heart failure, unspecified: Secondary | ICD-10-CM

## 2019-03-17 DIAGNOSIS — F329 Major depressive disorder, single episode, unspecified: Secondary | ICD-10-CM | POA: Diagnosis not present

## 2019-03-17 DIAGNOSIS — R609 Edema, unspecified: Secondary | ICD-10-CM | POA: Diagnosis not present

## 2019-03-17 DIAGNOSIS — Z9889 Other specified postprocedural states: Secondary | ICD-10-CM | POA: Diagnosis not present

## 2019-03-17 DIAGNOSIS — M7989 Other specified soft tissue disorders: Secondary | ICD-10-CM | POA: Diagnosis not present

## 2019-03-17 DIAGNOSIS — N390 Urinary tract infection, site not specified: Secondary | ICD-10-CM

## 2019-03-17 DIAGNOSIS — I351 Nonrheumatic aortic (valve) insufficiency: Secondary | ICD-10-CM | POA: Diagnosis not present

## 2019-03-17 DIAGNOSIS — N342 Other urethritis: Secondary | ICD-10-CM | POA: Diagnosis not present

## 2019-03-17 DIAGNOSIS — Z950 Presence of cardiac pacemaker: Secondary | ICD-10-CM | POA: Diagnosis not present

## 2019-03-17 DIAGNOSIS — N179 Acute kidney failure, unspecified: Secondary | ICD-10-CM | POA: Diagnosis not present

## 2019-03-17 DIAGNOSIS — I50813 Acute on chronic right heart failure: Secondary | ICD-10-CM | POA: Diagnosis not present

## 2019-03-17 DIAGNOSIS — I5042 Chronic combined systolic (congestive) and diastolic (congestive) heart failure: Secondary | ICD-10-CM | POA: Diagnosis not present

## 2019-03-17 DIAGNOSIS — I890 Lymphedema, not elsewhere classified: Secondary | ICD-10-CM | POA: Diagnosis not present

## 2019-03-17 DIAGNOSIS — I361 Nonrheumatic tricuspid (valve) insufficiency: Secondary | ICD-10-CM | POA: Diagnosis not present

## 2019-03-17 DIAGNOSIS — I50811 Acute right heart failure: Secondary | ICD-10-CM | POA: Diagnosis not present

## 2019-03-17 DIAGNOSIS — E875 Hyperkalemia: Secondary | ICD-10-CM | POA: Diagnosis not present

## 2019-03-17 DIAGNOSIS — Z888 Allergy status to other drugs, medicaments and biological substances status: Secondary | ICD-10-CM | POA: Diagnosis not present

## 2019-03-17 DIAGNOSIS — N183 Chronic kidney disease, stage 3 unspecified: Secondary | ICD-10-CM | POA: Diagnosis present

## 2019-03-17 DIAGNOSIS — I130 Hypertensive heart and chronic kidney disease with heart failure and stage 1 through stage 4 chronic kidney disease, or unspecified chronic kidney disease: Secondary | ICD-10-CM | POA: Diagnosis not present

## 2019-03-17 DIAGNOSIS — E860 Dehydration: Secondary | ICD-10-CM | POA: Diagnosis present

## 2019-03-17 DIAGNOSIS — L0390 Cellulitis, unspecified: Secondary | ICD-10-CM | POA: Diagnosis not present

## 2019-03-17 DIAGNOSIS — A408 Other streptococcal sepsis: Secondary | ICD-10-CM | POA: Diagnosis not present

## 2019-03-17 DIAGNOSIS — E8809 Other disorders of plasma-protein metabolism, not elsewhere classified: Secondary | ICD-10-CM | POA: Diagnosis not present

## 2019-03-17 DIAGNOSIS — I50812 Chronic right heart failure: Secondary | ICD-10-CM | POA: Diagnosis not present

## 2019-03-17 DIAGNOSIS — E662 Morbid (severe) obesity with alveolar hypoventilation: Secondary | ICD-10-CM | POA: Diagnosis not present

## 2019-03-17 DIAGNOSIS — R238 Other skin changes: Secondary | ICD-10-CM | POA: Diagnosis not present

## 2019-03-17 DIAGNOSIS — A419 Sepsis, unspecified organism: Secondary | ICD-10-CM | POA: Diagnosis present

## 2019-03-17 DIAGNOSIS — E871 Hypo-osmolality and hyponatremia: Secondary | ICD-10-CM | POA: Diagnosis not present

## 2019-03-17 DIAGNOSIS — J9601 Acute respiratory failure with hypoxia: Secondary | ICD-10-CM

## 2019-03-17 DIAGNOSIS — I5043 Acute on chronic combined systolic (congestive) and diastolic (congestive) heart failure: Secondary | ICD-10-CM | POA: Diagnosis not present

## 2019-03-17 DIAGNOSIS — I082 Rheumatic disorders of both aortic and tricuspid valves: Secondary | ICD-10-CM | POA: Diagnosis present

## 2019-03-17 DIAGNOSIS — E876 Hypokalemia: Secondary | ICD-10-CM | POA: Diagnosis not present

## 2019-03-17 DIAGNOSIS — I872 Venous insufficiency (chronic) (peripheral): Secondary | ICD-10-CM | POA: Diagnosis present

## 2019-03-17 DIAGNOSIS — D72829 Elevated white blood cell count, unspecified: Secondary | ICD-10-CM | POA: Diagnosis not present

## 2019-03-17 DIAGNOSIS — K644 Residual hemorrhoidal skin tags: Secondary | ICD-10-CM | POA: Diagnosis present

## 2019-03-17 DIAGNOSIS — R531 Weakness: Secondary | ICD-10-CM | POA: Diagnosis not present

## 2019-03-17 DIAGNOSIS — I480 Paroxysmal atrial fibrillation: Secondary | ICD-10-CM | POA: Diagnosis present

## 2019-03-17 DIAGNOSIS — R6520 Severe sepsis without septic shock: Secondary | ICD-10-CM | POA: Diagnosis not present

## 2019-03-17 DIAGNOSIS — I5082 Biventricular heart failure: Secondary | ICD-10-CM | POA: Diagnosis present

## 2019-03-17 DIAGNOSIS — L03116 Cellulitis of left lower limb: Secondary | ICD-10-CM | POA: Diagnosis not present

## 2019-03-17 DIAGNOSIS — R7881 Bacteremia: Secondary | ICD-10-CM | POA: Diagnosis not present

## 2019-03-17 DIAGNOSIS — K648 Other hemorrhoids: Secondary | ICD-10-CM | POA: Diagnosis present

## 2019-03-17 DIAGNOSIS — R011 Cardiac murmur, unspecified: Secondary | ICD-10-CM | POA: Diagnosis not present

## 2019-03-17 DIAGNOSIS — Z6841 Body Mass Index (BMI) 40.0 and over, adult: Secondary | ICD-10-CM | POA: Diagnosis not present

## 2019-03-17 DIAGNOSIS — Z952 Presence of prosthetic heart valve: Secondary | ICD-10-CM | POA: Diagnosis not present

## 2019-03-17 DIAGNOSIS — I442 Atrioventricular block, complete: Secondary | ICD-10-CM | POA: Diagnosis not present

## 2019-03-17 DIAGNOSIS — L03119 Cellulitis of unspecified part of limb: Secondary | ICD-10-CM | POA: Diagnosis not present

## 2019-03-17 DIAGNOSIS — Z20828 Contact with and (suspected) exposure to other viral communicable diseases: Secondary | ICD-10-CM | POA: Diagnosis not present

## 2019-03-17 DIAGNOSIS — I4891 Unspecified atrial fibrillation: Secondary | ICD-10-CM | POA: Diagnosis not present

## 2019-03-17 DIAGNOSIS — D509 Iron deficiency anemia, unspecified: Secondary | ICD-10-CM | POA: Diagnosis present

## 2019-03-17 DIAGNOSIS — B954 Other streptococcus as the cause of diseases classified elsewhere: Secondary | ICD-10-CM | POA: Diagnosis not present

## 2019-03-17 LAB — URINALYSIS, ROUTINE W REFLEX MICROSCOPIC
Bilirubin Urine: NEGATIVE
Glucose, UA: NEGATIVE mg/dL
Ketones, ur: NEGATIVE mg/dL
Nitrite: NEGATIVE
Protein, ur: 30 mg/dL — AB
Specific Gravity, Urine: 1.006 (ref 1.005–1.030)
WBC, UA: >50 WBC/hpf — ABNORMAL HIGH (ref 0–5)
pH: 6 (ref 5.0–8.0)

## 2019-03-17 LAB — BLOOD CULTURE ID PANEL (REFLEXED)

## 2019-03-17 LAB — CBC WITH DIFFERENTIAL/PLATELET
Abs Immature Granulocytes: 0.53 10*3/uL — ABNORMAL HIGH (ref 0.00–0.07)
Basophils Absolute: 0.1 10*3/uL (ref 0.0–0.1)
Basophils Relative: 0 %
Eosinophils Absolute: 0 10*3/uL (ref 0.0–0.5)
Eosinophils Relative: 0 %
HCT: 28.7 % — ABNORMAL LOW (ref 39.0–52.0)
Hemoglobin: 8.9 g/dL — ABNORMAL LOW (ref 13.0–17.0)
Immature Granulocytes: 2 %
Lymphocytes Relative: 2 %
Lymphs Abs: 0.7 10*3/uL (ref 0.7–4.0)
MCH: 31.6 pg (ref 26.0–34.0)
MCHC: 31 g/dL (ref 30.0–36.0)
MCV: 101.8 fL — ABNORMAL HIGH (ref 80.0–100.0)
Monocytes Absolute: 2 10*3/uL — ABNORMAL HIGH (ref 0.1–1.0)
Monocytes Relative: 7 %
Neutro Abs: 26.6 10*3/uL — ABNORMAL HIGH (ref 1.7–7.7)
Neutrophils Relative %: 89 %
Platelets: 270 10*3/uL (ref 150–400)
RBC: 2.82 MIL/uL — ABNORMAL LOW (ref 4.22–5.81)
RDW: 17 % — ABNORMAL HIGH (ref 11.5–15.5)
WBC: 29.8 10*3/uL — ABNORMAL HIGH (ref 4.0–10.5)
nRBC: 0 % (ref 0.0–0.2)

## 2019-03-17 LAB — BASIC METABOLIC PANEL
Anion gap: 13 (ref 5–15)
Anion gap: 20 — ABNORMAL HIGH (ref 5–15)
BUN: 54 mg/dL — ABNORMAL HIGH (ref 8–23)
BUN: 61 mg/dL — ABNORMAL HIGH (ref 8–23)
CO2: 18 mmol/L — ABNORMAL LOW (ref 22–32)
CO2: 24 mmol/L (ref 22–32)
Calcium: 9.1 mg/dL (ref 8.9–10.3)
Calcium: 9.5 mg/dL (ref 8.9–10.3)
Chloride: 97 mmol/L — ABNORMAL LOW (ref 98–111)
Chloride: 98 mmol/L (ref 98–111)
Creatinine, Ser: 2.54 mg/dL — ABNORMAL HIGH (ref 0.61–1.24)
Creatinine, Ser: 3.05 mg/dL — ABNORMAL HIGH (ref 0.61–1.24)
GFR calc Af Amer: 23 mL/min — ABNORMAL LOW (ref >60)
GFR calc Af Amer: 29 mL/min — ABNORMAL LOW (ref >60)
GFR calc non Af Amer: 20 mL/min — ABNORMAL LOW (ref >60)
GFR calc non Af Amer: 25 mL/min — ABNORMAL LOW (ref >60)
Glucose, Bld: 100 mg/dL — ABNORMAL HIGH (ref 70–99)
Glucose, Bld: 90 mg/dL (ref 70–99)
Potassium: 4.3 mmol/L (ref 3.5–5.1)
Potassium: 4.6 mmol/L (ref 3.5–5.1)
Sodium: 135 mmol/L (ref 135–145)
Sodium: 135 mmol/L (ref 135–145)

## 2019-03-17 LAB — CBC
HCT: 27 % — ABNORMAL LOW (ref 39.0–52.0)
Hemoglobin: 8.3 g/dL — ABNORMAL LOW (ref 13.0–17.0)
MCH: 31.7 pg (ref 26.0–34.0)
MCHC: 30.7 g/dL (ref 30.0–36.0)
MCV: 103.1 fL — ABNORMAL HIGH (ref 80.0–100.0)
Platelets: 224 10*3/uL (ref 150–400)
RBC: 2.62 MIL/uL — ABNORMAL LOW (ref 4.22–5.81)
RDW: 17.1 % — ABNORMAL HIGH (ref 11.5–15.5)
WBC: 42.8 10*3/uL — ABNORMAL HIGH (ref 4.0–10.5)
nRBC: 0 % (ref 0.0–0.2)

## 2019-03-17 LAB — LACTIC ACID, PLASMA: Lactic Acid, Venous: 2.8 mmol/L (ref 0.5–1.9)

## 2019-03-17 LAB — PROTIME-INR
INR: 4.7 (ref 0.8–1.2)
INR: 5.2 (ref 0.8–1.2)
Prothrombin Time: 44.4 seconds — ABNORMAL HIGH (ref 11.4–15.2)
Prothrombin Time: 48.2 seconds — ABNORMAL HIGH (ref 11.4–15.2)

## 2019-03-17 LAB — POC OCCULT BLOOD, ED: Fecal Occult Bld: POSITIVE — AB

## 2019-03-17 LAB — POC SARS CORONAVIRUS 2 AG -  ED: SARS Coronavirus 2 Ag: NEGATIVE

## 2019-03-17 LAB — BRAIN NATRIURETIC PEPTIDE: B Natriuretic Peptide: 105.7 pg/mL — ABNORMAL HIGH (ref 0.0–100.0)

## 2019-03-17 LAB — SARS CORONAVIRUS 2 (TAT 6-24 HRS): SARS Coronavirus 2: NEGATIVE

## 2019-03-17 MED ORDER — FENTANYL CITRATE (PF) 100 MCG/2ML IJ SOLN: 25.0000 ug | INTRAMUSCULAR | Status: DC | PRN

## 2019-03-17 MED ORDER — FENTANYL CITRATE (PF) 100 MCG/2ML IJ SOLN
100.0000 ug | Freq: Once | INTRAMUSCULAR | Status: AC
  Administered 2019-03-17: 100 ug via INTRAVENOUS
  Filled 2019-03-17: qty 2

## 2019-03-17 MED ORDER — FUROSEMIDE 10 MG/ML IJ SOLN
40.0000 mg | Freq: Every day | INTRAMUSCULAR | Status: DC
  Administered 2019-03-17: 40 mg via INTRAVENOUS
  Filled 2019-03-17: qty 4

## 2019-03-17 MED ORDER — ACETAMINOPHEN 325 MG PO TABS
650.0000 mg | ORAL_TABLET | Freq: Four times a day (QID) | ORAL | Status: DC | PRN
  Administered 2019-03-17 – 2019-03-20 (×3): 650 mg via ORAL
  Filled 2019-03-17 (×3): qty 2

## 2019-03-17 MED ORDER — FERROUS SULFATE 325 (65 FE) MG PO TABS
325.0000 mg | ORAL_TABLET | Freq: Every day | ORAL | Status: DC
  Administered 2019-03-17 – 2019-03-19 (×3): 325 mg via ORAL
  Filled 2019-03-17 (×3): qty 1

## 2019-03-17 MED ORDER — FUROSEMIDE 10 MG/ML IJ SOLN: 40.0000 mg | Freq: Once | INTRAMUSCULAR | Status: DC

## 2019-03-17 MED ORDER — PSYLLIUM 95 % PO PACK
1.0000 | PACK | Freq: Every day | ORAL | Status: DC
  Administered 2019-03-17 – 2019-03-28 (×8): 1 via ORAL
  Filled 2019-03-17 (×11): qty 1

## 2019-03-17 MED ORDER — SODIUM CHLORIDE 0.9 % IV SOLN
2.0000 g | INTRAVENOUS | Status: DC
  Administered 2019-03-17 – 2019-03-19 (×3): 2 g via INTRAVENOUS
  Filled 2019-03-17: qty 20
  Filled 2019-03-17: qty 2
  Filled 2019-03-17: qty 20
  Filled 2019-03-17 (×2): qty 2

## 2019-03-17 MED ORDER — SODIUM CHLORIDE 0.9 % IV SOLN
1.0000 g | Freq: Once | INTRAVENOUS | Status: AC
  Administered 2019-03-17: 1 g via INTRAVENOUS
  Filled 2019-03-17: qty 10

## 2019-03-17 MED ORDER — SODIUM CHLORIDE 0.9 % IV SOLN
1.0000 g | INTRAVENOUS | Status: DC
  Filled 2019-03-17: qty 10

## 2019-03-17 MED ORDER — HYDROCORTISONE ACETATE 25 MG RE SUPP
25.0000 mg | Freq: Two times a day (BID) | RECTAL | Status: DC
  Filled 2019-03-17 (×2): qty 1

## 2019-03-17 MED ORDER — WARFARIN - PHARMACIST DOSING INPATIENT: Freq: Every day | Status: DC

## 2019-03-17 MED ORDER — SODIUM CHLORIDE 0.9 % IV BOLUS
500.0000 mL | Freq: Once | INTRAVENOUS | Status: AC
  Administered 2019-03-17: 500 mL via INTRAVENOUS

## 2019-03-17 NOTE — Progress Notes (Signed)
  PROGRESS NOTE  Patient admitted earlier this morning. See H&P.    Frank [name] is a 73 y.o. male with medical history significant of diverticulosis, hemorrhoids, paroxysmal A. 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  Patient states he has chronic shortness of breath which is now worse. He is complaining of pain in his right lower extremity.  States his right leg is chronically very swollen and painful.  States he had a colonoscopy a month ago and was told he had rectal bleeding due to hemorrhoids.  He continues to have intermittent rectal bleeding.  He notices bright red blood with bowel movements.  He goes to a Coumadin clinic and takes Coumadin 15 mg on Sunday and Wednesday, 10 mg the remaining 5 days of the week.  Upon presentation to the emergency department, patient was found to be febrile, hypoxemic.  Covid test was negative.  UA revealed concern for UTI and patient was started on Rocephin.  On examination this morning, patient states that his breathing is improved since admission.  He relates that his right lower extremity is chronically swollen and painful due to previous procedure on that leg in the 1990s.  Sepsis secondary to UTI, POA  -Continue Rocephin -Urine culture pending -Blood cultures pending  Right lower extremity edema and pain -This appears to be chronic in nature, states that this has been this way since 1990s -DVT ultrasound ordered to rule out DVT, although patient's INR remains elevated so my suspicion for DVT is low  Acute hypoxic respiratory failure secondary to acute exacerbation of chronic combined CHF -Chest x-ray showed  cardiomegaly with mild volume overload -Remains on 3 L nasal cannula O2 this morning -Lasix  -Intake and output, daily weights, low-sodium diet with fluid restriction  AKI -Baseline creatinine was ranging between 1.2-1.6  -Continue to monitor renal function -Avoid nephrotoxic agents -Monitor urine output -Hold Cozaar, Aldactone  History of mechanical mitral valve on chronic anticoagulation with Jantoven -Goal INR 2.5-3.5 -Monitor daily INR, pharmacy consulted  Paroxysmal atrial fibrillation -Pharmacy to follow for Coumadin/INR  Rectal bleeding -Suspect related to hemorrhoids and anticoagulation/supratherapeutic INR. Colonoscopy done 11/30 showing external and internal hemorrhoids, multiple diverticula in the left colon.  FOBT positive.  Hemoglobin 8.9, stable since labs done 5 days ago.   -Anusol HC suppository -Continue to monitor hemoglobin    [name], DO [facility] 03/17/2019, 10:35 AM  Available via Epic secure chat 7am-7pm After these hours, please refer to coverage provider listed on [email or web address]

## 2019-03-17 NOTE — H&P (Signed)
History and Physical    [name]:[id number] DOB: [birth year] DOA: 03/16/2019  PCP: [name], MD Patient coming from: Home  Chief Complaint: Rectal bleeding, shortness breath, cough  HPI: Frank [name] is a 73 y.o. male with medical history significant of diverticulosis, hemorrhoids, paroxysmal A. 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  Patient states he has chronic shortness of breath which is now worse.  Also reports a cough and orthopnea.  He drinks a lot of fluid.  He does take his home torsemide and metolazone as instructed.  Denies fevers but does report chills at home.  He is complaining of pain in his right lower extremity.  States his right leg is chronically very swollen and painful.  States he had a colonoscopy a month ago and was told he had rectal bleeding due to hemorrhoids.  He continues to have intermittent rectal bleeding.  He notices bright red blood with bowel movements.  He goes to a Coumadin clinic and takes Coumadin 15 mg on Sunday and Wednesday, 10 mg the remaining 5 days of the week.  No recent change in the dosing of his Coumadin.  Denies dysuria.  Denies nausea, vomiting, or abdominal pain.  ED Course: Temperature 103 F.  Tachypneic with respiratory rate in the 20s.  Oxygen saturation 86% on room air, placed on 2 L supplemental oxygen. WBC count 29.8 with left shift.  Lactic acid 2.8.  Hemoglobin 8.9, stable since labs done 5 days ago.  FOBT positive.  BUN 54, creatinine 2.5.  Creatinine was ranging between 1.2-1.6 a month ago.  INR 4.7.  BNP 105.  Blood culture x2 pending.  Rapid SARS-CoV-2 antigen test negative; PCR test  pending.  UA with large amount of leukocytes, 6-10 RBCs, greater than 50 WBCs, and rare bacteria.  Urine culture pending.  Chest x-ray showing cardiomegaly with mild volume overload. Patient received ceftriaxone.  Received fentanyl for leg pain and his blood pressure dropped afterwards.  Review of Systems:  All systems reviewed and apart from history of presenting illness, are negative.  Past Medical History:  Diagnosis Date  . Atrial arrhythmia    status post ablation in [place] with complication including damage to the aortic valve  . Atrial fibrillation ([place])   . AV block, 1st degree   . Bradycardia   . Cluster headache ~ 1980   "went away after I started taking one of the heart RXs" (06/29/2017)  . Diastolic CHF, chronic ([place])   . Diverticulosis   . History of gout   . HTN (hypertension)   . Hypertrophic cardiomyopathy ([place])    s/p myoectomy  . Infection   . Internal hemorrhoids   . Morbid obesity ([place])   . Morbid obesity with BMI of 45.0-49.9, adult ([place])   . Pacemaker  -SJM   . Skin cancer    "burned off RUE X 1; cut off nose X 2" (06/29/2017)  . TIA (transient ischemic attack) 2001  . Tubulovillous adenoma     Past Surgical History:  Procedure Laterality Date  . 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 BIV PACEMAKER INSERTION CRT-P N/A 07/27/2017   Procedure: BIV PACEMAKER INSERTION CRT-P;  Surgeon: [name]  C, MD;  Location: [facility];  Service: Cardiovascular;  Laterality: N/A;  . BIV UPGRADE N/A 05/28/2017   Procedure: BIV [place];  Surgeon: [name], MD;  Location: [facility];  Service: Cardiovascular;  Laterality: N/A;  . CARDIAC CATHETERIZATION     "couple times" (06/29/2017)  . CARDIAC VALVE REPLACEMENT    . COLONOSCOPY    . COLONOSCOPY WITH PROPOFOL N/A 04/13/2016   Procedure: COLONOSCOPY WITH PROPOFOL;  Surgeon: Gatha Mayer, MD;  Location: [facility];  Service:  Endoscopy;  Laterality: N/A;  . COLONOSCOPY WITH PROPOFOL N/A 02/28/2019   Procedure: COLONOSCOPY WITH PROPOFOL;  Surgeon: Gatha Mayer, MD;  Location: [facility];  Service: Endoscopy;  Laterality: N/A;  . INSERT / REPLACE / REMOVE PACEMAKER  ~ [address] [employer]   . 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  . PACEMAKER LEAD REMOVAL N/A 06/29/2017   Procedure: [id number]  REMOVAL AND Four LEAD REMOVAL;  Surgeon: Evans Lance, MD;  Location: [facility];  Service: Cardiovascular;  Laterality: N/A;  . PACEMAKER REMOVAL  06/29/2017   GERNERATOR  REMOVAL AND Four LEAD REMOVAL; put in external pacemaker (06/29/2017)  . septal myectomy  07/22/1997  . SKIN CANCER EXCISION       reports that he quit smoking about 41 years ago. His smoking use included cigarettes, pipe, and cigars. He quit after 10.00 years of use. He has never used smokeless tobacco. He reports current alcohol use of about 2.0 standard drinks of alcohol per week. He reports that he does not use drugs.  Allergies  Allergen Reactions  . Warfarin And Related Other (See Comments)    Per the patient's wife, "Warfain made him have a TIA" CAN TAKE BRAND NAME COUMADIN     Family History  Problem Relation Age of Onset  . Heart disease Sister   . Uterine cancer Sister   . Obesity Sister        500+ lbs  . Heart disease Brother   . Heart disease Sister   . Heart disease Father   . Colon cancer Father 29  . Crohn's disease Paternal Grandmother     Prior to Admission medications   Medication Sig Start Date End Date Taking? Authorizing Provider  acetaminophen (TYLENOL) 325 MG tablet Take 2 tablets (650 mg total) by mouth every 6 (six) hours as needed for mild pain. 07/28/17   [facility], Tami Lin, PA  Ascorbic Acid (VITAMIN C) 1000 MG tablet Take 2,000 mg by mouth daily.      [provider]  aspirin 81 MG tablet Take 81 mg by mouth daily.    [provider]  diphenhydramine-acetaminophen  (TYLENOL PM) 25-500 MG TABS tablet Take 2 tablets by mouth at bedtime as needed (for sleep).    [provider]  fish oil-omega-3 fatty acids 1000 MG capsule Take 4 g by mouth every morning.     [provider]  JANTOVEN 10 MG tablet Take as directed by Coumadin Clinic Patient taking differently: Take 10-15 mg by mouth See admin instructions. Take as directed by Coumadin Clinic; 15 mg on Sun and Wed; 10 mg all other days 12/20/18   [name], MD  losartan (COZAAR) 50 MG tablet Take 1 tablet (50 mg total) by mouth daily. 02/21/19 05/22/19  [name], MD  Magnesium 250 MG TABS Take 500-750 mg by mouth See admin instructions. 750 mg in the morning, 500 mg in the  evening    [provider]  metolazone (ZAROXOLYN) 5 MG tablet Take 1 tablet (5 mg total) by mouth See admin instructions. Once weekly if needed for swelling 02/21/19   [name], MD  Multiple Vitamin (MULTIVITAMIN) tablet Take 1 tablet by mouth daily.      [provider]  potassium chloride (KLOR-CON) 10 MEQ tablet Take 13 tablets (130 mEq total) by mouth See admin instructions. Patient takes 13 tablets throughout the day Take 132mEq on days you take Metolazone 02/21/19   [name], MD  spironolactone (ALDACTONE) 25 MG tablet Take 1 tablet (25 mg total) by mouth daily. 02/21/19   [name], MD  torsemide (DEMADEX) 20 MG tablet Take 3 tablets (60 mg total) by mouth 2 (two) times daily. 02/21/19   [name], MD  enoxaparin (LOVENOX) 150 MG/ML injection Inject 1 mL (150 mg total) into the skin every 12 (twelve) hours. 02/20/19 03/16/19  [name], MD  fondaparinux (ARIXTRA) 10 MG/0.8ML SOLN injection Inject 0.8 mLs (10 mg total) into the skin daily. 02/20/19 03/16/19  [name], MD    Physical Exam: Vitals:   03/17/19 0200 03/17/19 0215 03/17/19 0244 03/17/19 0255  BP: (!) 90/55 99/60 (!) 96/54 (!) 103/44  Pulse: 68 70 68 69  Resp: 19 20 18  (!) 22  Temp:   98.9  F (37.2 C)   TempSrc:   Oral   SpO2: 91% 91% 94% 91%    Physical Exam  Constitutional: He is oriented to person, place, and time. He appears well-developed and well-nourished.  HENT:  Head: Normocephalic.  Eyes: Right eye exhibits no discharge. Left eye exhibits no discharge.  Neck: Neck supple.  Cardiovascular: Normal rate, regular rhythm and intact distal pulses.  Pulmonary/Chest: He has no wheezes. He has no rales.  On 2 L supplemental oxygen via nasal cannula Examination limited secondary to patient's large body habitus  Abdominal: Soft. Bowel sounds are normal. He exhibits no distension. There is no abdominal tenderness. There is no guarding.  Musculoskeletal:        General: Edema present.     Comments: Significant +3 to +4 pitting edema bilateral lower extremities.  Right lower extremity worse.  Neurological: He is alert and oriented to person, place, and time.  Skin: Skin is warm and dry. He is not diaphoretic. There is erythema.  Right lower extremity erythematous and warm to touch     Labs on Admission: I have personally reviewed following labs and imaging studies  CBC: Recent Labs  Lab 03/12/19 1634 03/16/19 2359  WBC 7.2 29.8*  NEUTROABS  --  26.6*  HGB 8.9 Repeated and verified X2.* 8.9*  HCT 26.9 Repeated and verified X2.* 28.7*  MCV 95.6 101.8*  PLT 242.0 269   Basic Metabolic Panel: Recent Labs  Lab 03/16/19 2359  NA 135  K 4.3  CL 98  CO2 24  GLUCOSE 100*  BUN 54*  CREATININE 2.54*  CALCIUM 9.5   GFR: CrCl cannot be calculated (Unknown ideal weight.). Liver Function Tests: No results for input(s): AST, ALT, ALKPHOS, BILITOT, PROT, ALBUMIN in the last 168 hours. No results for input(s): LIPASE, AMYLASE in the last 168 hours. No results for input(s): AMMONIA in the last 168 hours. Coagulation Profile: Recent Labs  Lab 03/16/19 2359  INR 4.7*   Cardiac Enzymes: No results for input(s): CKTOTAL, CKMB, CKMBINDEX, TROPONINI in the last 168  hours. BNP (last 3 results) No results for input(s): PROBNP in the  last 8760 hours. HbA1C: No results for input(s): HGBA1C in the last 72 hours. CBG: No results for input(s): GLUCAP in the last 168 hours. Lipid Profile: No results for input(s): CHOL, HDL, LDLCALC, TRIG, CHOLHDL, LDLDIRECT in the last 72 hours. Thyroid Function Tests: No results for input(s): TSH, T4TOTAL, FREET4, T3FREE, THYROIDAB in the last 72 hours. Anemia Panel: No results for input(s): VITAMINB12, FOLATE, FERRITIN, TIBC, IRON, RETICCTPCT in the last 72 hours. Urine analysis:    Component Value Date/Time   COLORURINE YELLOW 03/17/2019 0054   APPEARANCEUR CLOUDY (A) 03/17/2019 0054   LABSPEC 1.006 03/17/2019 0054   PHURINE 6.0 03/17/2019 0054   GLUCOSEU NEGATIVE 03/17/2019 0054   HGBUR SMALL (A) 03/17/2019 0054   BILIRUBINUR NEGATIVE 03/17/2019 0054   [place] 03/17/2019 0054   PROTEINUR 30 (A) 03/17/2019 0054   NITRITE NEGATIVE 03/17/2019 0054   LEUKOCYTESUR LARGE (A) 03/17/2019 0054    Radiological Exams on Admission: Dg Chest Port 1 View  Result Date: 03/16/2019 CLINICAL DATA:  Shortness of breath EXAM: PORTABLE CHEST 1 VIEW COMPARISON:  July 10, 2018 FINDINGS: The heart size is significantly enlarged. The patient is status post prior median sternotomy. Aortic calcifications are noted. There is a multi lead right-sided pacemaker in place. There is vascular congestion without overt pulmonary edema. There is no pneumothorax or large pleural effusion. There is no acute osseous abnormality. IMPRESSION: Cardiomegaly with mild volume overload. Electronically Signed   By: Constance Holster M.D.   On: 03/16/2019 23:50    EKG: Independently reviewed.  Paced rhythm.  Assessment/Plan Principal Problem:   UTI (urinary tract infection) Active Problems:   Sepsis ([place])   Acute respiratory failure with hypoxia (H[facility])   CHF exacerbation ([facility])   AKI (acute kidney injury) ([place])   Sepsis secondary to UTI and  possible cellulitis of the right lower extremity Febrile with temperature 103 F. WBC count 29.8 with left shift.  Lactic acid 2.8. UA with large amount of leukocytes, 6-10 RBCs, greater than 50 WBCs, and rare bacteria. Right lower extremity appears erythematous and warm to touch.  Concerning for cellulitis vs DVT. -Unable to give fluid in setting of volume overload from acutely decompensated CHF -Ceftriaxone -Tylenol as needed for fevers -Urine culture pending -Blood culture x2 pending -Continue to monitor WBC count -Right lower extremity Doppler to rule out DVT  Acute hypoxic respiratory failure secondary to acute exacerbation of chronic combined CHF Patient presenting with complaints of dyspnea, cough, and orthopnea.  Volume overloaded on exam with significant bilateral lower extremity edema.  Chest x-ray showing cardiomegaly with mild volume overload. Oxygen saturation 86% on room air, placed on 2 L supplemental oxygen. Last echo done February 14, 2019 with LVEF 60 to 40% and diastolic function could not be evaluated. -Unable to diurese at this time due to low blood pressure after patient received fentanyl in the ED for right lower extremity pain.  Most recent blood pressure 95/45.  Lasix can be given after blood pressure improves. -Intake and output, daily weights, low-sodium diet with fluid restriction -Continuous pulse ox -Supplemental oxygen to keep oxygen saturation above 92%  AKI BUN 54, creatinine 2.5.  Creatinine was ranging between 1.2-1.6 a month ago.  Suspect cardiorenal in the setting of acutely decompensated CHF. -Give Lasix when blood pressure improves -Continue to monitor renal function -Avoid nephrotoxic agents -Monitor urine output  History of mechanical mitral valve on chronic anticoagulation with Jantoven Goal INR 2.5-3.5.  INR currently supratherapeutic at 4.7. -Hold Jantoven at this time -Repeat PT/INR  in a.m.  Paroxysmal atrial fibrillation INR supratherapeutic.  -Hold Jantoven at this time -Repeat PT/INR in a.m.  Rectal bleeding Suspect related to hemorrhoids and anticoagulation/supratherapeutic INR.  Colonoscopy done 11/30 showing external and internal hemorrhoids, multiple diverticula in the left colon.  FOBT positive.  Hemoglobin 8.9, stable since labs done 5 days ago.   -Since hemoglobin is stable and no significant active rectal bleeding, will hold off giving medications for INR reversal -Hold Jantoven -Continue to monitor -Increase dietary fiber intake, psyllium supplement -Anusol HC suppository  DVT prophylaxis: On chronic anticoagulation and INR is currently supratherapeutic Code Status: Patient wishes to be full code. Family Communication: No family available. Disposition Plan: Anticipate discharge after clinical improvement. Consults called: None Admission status: It is my clinical opinion that admission to INPATIENT is reasonable and necessary in this 73 y.o. male . presenting with sepsis secondary to UTI and possible cellulitis of the right lower extremity, acute hypoxic respiratory failure secondary to chronic combined CHF, AKI, supratherapeutic INR in the setting of chronic anticoagulation use, rectal bleeding suspected to be due to hemorrhoids.  High risk of decompensation.  Given the aforementioned, the predictability of an adverse outcome is felt to be significant. I expect that the patient will require at least 2 midnights in the hospital to treat this condition.   The medical decision making on this patient was of high complexity and the patient is at high risk for clinical deterioration, therefore this is a level 3 visit.  [name] MD [facility] Pager [phone]  If 7PM-7AM, please contact night-coverage [email or web address] Password [email or web address]  03/17/2019, 3:08 AM

## 2019-03-17 NOTE — Progress Notes (Signed)
PHARMACY - PHYSICIAN COMMUNICATION CRITICAL VALUE ALERT - BLOOD CULTURE IDENTIFICATION (BCID)  Frank [name] is an 73 y.o. male who presented to [facility] on [facility]/16/2019 with a chief complaint of rectal bleeding and shortness of breath.   Assessment:  73 year old male admitted with rectal bleeding. Now with gram positive cocci in chains in 1/4 blood cultures. Note patient does have a history of PPM and mechanical mitral valve. This is likely a strep or an enterococcus species.   Name of physician (or Provider) ContactedMaylene Roes   Current antibiotics: Ceftriaxone   Changes to prescribed antibiotics recommended:  Increase to ceftriaxone 2 gm every 24 hours for now   No results found for this or any previous visit.  Jimmy Footman, PharmD, BCPS, BCIDP Infectious Diseases Clinical Pharmacist Phone: [phone] 03/17/2019  3:32 PM

## 2019-03-17 NOTE — Progress Notes (Signed)
Patient and his wife asked to speak with MD. Dr. [name] paged at this time.

## 2019-03-17 NOTE — Progress Notes (Signed)
PT Cancellation Note  Patient Details Name: Frank [name] MRN: [id number] DOB: 1945-06-07   Cancelled Treatment:    Reason Eval/Treat Not Completed: Medical issues which prohibited therapy per chart review, INR supratherapeutic at 5.2 and BP elevated at 136/121. Holding for now but will follow acutely. If patient has urgent rehab needs, please direct message this therapist in [place] (8am-4pm) or call acute rehab office at number below.     Windell Norfolk, DPT, PN1   Supplemental Physical Therapist [facility]    Pager [phone] Acute Rehab Office [phone]

## 2019-03-17 NOTE — Progress Notes (Addendum)
After bolus completion, Patient's BP 90/52 manual, 76/53 automatic. HR 66. O2 100 % 5 liters. Patient asymptomatic. MD paged at this time.   Rapid response notified to put patient on radar.   Emelda Fear, RN

## 2019-03-17 NOTE — Progress Notes (Addendum)
Patient's BP 88/44 right arm manual, 80/53 left leg automatic. HR 60, O2 100% 2 liters. Patient states "weak" but no other symptoms. States no different than how he has felt for about two weeks. MD paged at this time.   1621: New orders received.   Emelda Fear, RN

## 2019-03-17 NOTE — Progress Notes (Signed)
Pt received from ED via bed. AO x4, SOB. o2 sat 96% in 3l o2 via [place]. Vitals stable.  CHG bath completed, connected to monitor, ccmd notified. PT has +3 pitting edema to BLE. Able to ambulate. Oriented pt to room and call bell system. Call bell within reach. Will continue to monitor.

## 2019-03-17 NOTE — Progress Notes (Signed)
ANTICOAGULATION CONSULT NOTE - Initial Consult  Pharmacy Consult for warfarin Indication: mech MVR (1999),  Afib (CHADS2VASc =6)   Allergies  Allergen Reactions  . Warfarin And Related Other (See Comments)    COUMADIN or JANTOVEN BRAND ONLY** Per pt, was switched to generic in 2001 and had significantly decreased absorption > INR subtherapeutic> pt suffered a TIA and has been on brand name only since      Patient Measurements: Height: 5\' 11"  (180.3 cm) Weight: (!) 333 lb 1.8 oz (151.1 kg) IBW/kg (Calculated) : 75.3  Vital Signs: Temp: 97.6 F (36.4 C) (12/07 0653) Temp Source: Oral (12/07 0653) BP: 102/55 (12/07 0653) Pulse Rate: 60 (12/07 0653)  Labs: Recent Labs    03/16/19 2359 03/17/19 0743  HGB 8.9* 8.3*  HCT 28.7* 27.0*  PLT 270 224  LABPROT 44.4* 48.2*  INR 4.7* 5.2*  CREATININE 2.54* 3.05*    Estimated Creatinine Clearance: 33.7 mL/min (A) (by C-G formula based on SCr of 3.05 mg/dL (H)).  Assessment: 73 yo M on warfarin for mechMVR 1999 and Afib (CHADS2VASc =6) admitted with melena and INR of 5.2. H/H is stable and given recent colonoscopy with hemorroidal bleeding, INR was not reversed.    Of note, patient takes COUMADIN or JANTOVEN BRAND ONLY** Per pt, was switched to generic in 2001 and had significantly decreased absorption > INR subtherapeutic> pt suffered a TIA and has been on brand name only since then. Will have wife bring in home supply since we only have generic warfarin.    Home warfarin: 15mg  sun/wed, 10mg  AOD (stable dose since 2019). Per patient, has been drinking vinegar and eating more picked vegetables but otherwise no change in diet. Suspect INR change is due to acute infection and volume overload.   Goal of Therapy:  INR 2.5- 3.5 Monitor platelets by anticoagulation protocol: Yes   Plan:  Hold warfarin until INR is less than ~3.5   Will use patient's home supply when restart warfarin   Benetta Spar, PharmD, BCPS, BCCP Clinical  Pharmacist  Please check AMION for all [place] phone numbers After 10:00 PM, call [name] [phone]

## 2019-03-17 NOTE — Plan of Care (Signed)
Continue to monitor

## 2019-03-17 NOTE — ED Notes (Addendum)
Manual bp from 62-563 systolic  Manually, rechecked multiple times, continuing to get wide range of pressure readings depite this RN and EMT Karissa checking pressure manually. Dr. Christy Gentles made aware

## 2019-03-17 NOTE — ED Notes (Signed)
ED TO INPATIENT HANDOFF REPORT  ED Nurse Name and Phone #: [phone]  S Name/Age/Gender Frank [name] 73 y.o. male Room/Bed: TRAAC/TRAAC  Code Status   Code Status: Prior  Home/SNF/Other Home Patient oriented to: self, place, time and situation Is this baseline? Yes   Triage Complete: Triage complete  Chief Complaint GI BLEEDING  Triage Note Pt arrives via [facility]. Initial call out was for weakness, additionally that the patient has been having rectal bleeding (colonoscopy two weeks ago). Pt reporting he has had increasing SOB, cough. Denies fever, temp for ems 100. Denies sick contacts.    Allergies Allergies  Allergen Reactions  . Warfarin And Related Other (See Comments)    Per the patient's wife, "Warfain made him have a TIA" CAN TAKE BRAND NAME COUMADIN     Level of Care/Admitting Diagnosis ED Disposition    ED Disposition Condition [place]: [place] [[id number]]  Level of Care: Progressive [102]  Admit to Progressive based on following criteria: MULTISYSTEM THREATS such as stable sepsis, metabolic/electrolyte imbalance with or without encephalopathy that is responding to early treatment.  Covid Evaluation: Confirmed COVID Negative  Diagnosis: Sepsis [facility]) [[id number]]  Admitting Physician: [name] [[id number]]  Attending Physician: [name] [[id number]]  Estimated length of stay: past midnight tomorrow  Certification:: I certify this patient will need inpatient services for at least 2 midnights  PT Class (Do Not Modify): Inpatient [101]  PT Acc Code (Do Not Modify): Private [1]       B Medical/Surgery History Past Medical History:  Diagnosis Date  . Atrial arrhythmia    status post ablation in [place] with complication including damage to the aortic valve  . Atrial fibrillation ([place])   . AV block, 1st degree   . Bradycardia   . Cluster headache ~ 1980   "went away after I started taking one of the heart  RXs" (06/29/2017)  . Diastolic CHF, chronic ([place])   . Diverticulosis   . History of gout   . HTN (hypertension)   . Hypertrophic cardiomyopathy ([place])    s/p myoectomy  . Infection   . Internal hemorrhoids   . Morbid obesity ([place])   . Morbid obesity with BMI of 45.0-49.9, adult ([place])   . Pacemaker  -SJM   . Skin cancer    "burned off RUE X 1; cut off nose X 2" (06/29/2017)  . TIA (transient ischemic attack) 2001  . Tubulovillous adenoma    Past Surgical History:  Procedure Laterality Date  . 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 BIV PACEMAKER INSERTION CRT-P N/A 07/27/2017   Procedure: BIV PACEMAKER INSERTION CRT-P;  Surgeon: [name], MD;  Location: [place] [facility];  Service: Cardiovascular;  Laterality: N/A;  . BIV UPGRADE N/A 05/28/2017   Procedure: BIV [place];  Surgeon: [name], MD;  Location: [place] [facility];  Service: Cardiovascular;  Laterality: N/A;  . CARDIAC CATHETERIZATION     "couple times" (06/29/2017)  . CARDIAC VALVE REPLACEMENT    . COLONOSCOPY    . COLONOSCOPY WITH PROPOFOL N/A 04/13/2016   Procedure: COLONOSCOPY WITH PROPOFOL;  Surgeon: Gatha Mayer, MD;  Location: [facility];  Service: Endoscopy;  Laterality: N/A;  . COLONOSCOPY WITH PROPOFOL N/A 02/28/2019   Procedure: COLONOSCOPY WITH PROPOFOL;  Surgeon: Gatha Mayer, MD;  Location: [facility];  Service: Endoscopy;  Laterality: N/A;  . INSERT / REPLACE / REMOVE  PACEMAKER  ~ [address] [employer]   . 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  . PACEMAKER LEAD REMOVAL N/A 06/29/2017   Procedure: [id number]  REMOVAL AND Four LEAD REMOVAL;  Surgeon: Evans Lance, MD;  Location: [place];  Service: Cardiovascular;  Laterality: N/A;  . PACEMAKER REMOVAL  06/29/2017   GERNERATOR  REMOVAL AND Four LEAD REMOVAL; put in external pacemaker (06/29/2017)  . septal myectomy  07/22/1997  . SKIN CANCER EXCISION        A IV Location/Drains/Wounds Patient Lines/Drains/Airways Status   Active Line/Drains/Airways    Name:   Placement date:   Placement time:   Site:   Days:   Peripheral IV 03/17/19 Left Hand   03/17/19    0006    Hand   less than 1   Incision (Closed) 07/27/17 Chest Left   07/27/17    1419     598   Incision (Closed) 07/27/17 Breast Right   07/27/17    2041     598          Intake/Output Last 24 hours No intake or output data in the 24 hours ending 03/17/19 0923  Labs/Imaging Results for orders placed or performed during the hospital encounter of 03/16/19 (from the past 48 hour(s))  Basic metabolic panel     Status: Abnormal   Collection Time: 03/16/19 11:59 PM  Result Value Ref Range   Sodium 135 135 - 145 mmol/L   Potassium 4.3 3.5 - 5.1 mmol/L   Chloride 98 98 - 111 mmol/L   CO2 24 22 - 32 mmol/L   Glucose, Bld 100 (H) 70 - 99 mg/dL   BUN 54 (H) 8 - 23 mg/dL   Creatinine, Ser 2.54 (H) 0.61 - 1.24 mg/dL   Calcium 9.5 8.9 - 10.3 mg/dL   GFR calc non Af Amer 25 (L) >60 mL/min   GFR calc Af Amer 29 (L) >60 mL/min   Anion gap 13 5 - 15    Comment: Performed at [facility], [address], [place], [place] [address]  CBC with Differential     Status: Abnormal   Collection Time: 03/16/19 11:59 PM  Result Value Ref Range   WBC 29.8 (H) 4.0 - 10.5 K/uL   RBC 2.82 (L) 4.22 - 5.81 MIL/uL   Hemoglobin 8.9 (L) 13.0 - 17.0 g/dL   HCT 28.7 (L) 39.0 - 52.0 %   MCV 101.8 (H) 80.0 - 100.0 fL   MCH 31.6 26.0 - 34.0 pg   MCHC 31.0 30.0 - 36.0 g/dL   RDW 17.0 (H) 11.5 - 15.5 %   Platelets 270 150 - 400 K/uL   nRBC 0.0 0.0 - 0.2 %   Neutrophils Relative % 89 %   Neutro Abs 26.6 (H) 1.7 - 7.7 K/uL   Lymphocytes Relative 2 %   Lymphs Abs 0.7 0.7 - 4.0 K/uL   Monocytes Relative 7 %   Monocytes Absolute 2.0 (H) 0.1 - 1.0 K/uL   Eosinophils Relative 0 %   Eosinophils Absolute 0.0 0.0 - 0.5 K/uL   Basophils Relative 0 %   Basophils Absolute 0.1 0.0 - 0.1 K/uL   WBC Morphology  VACUOLATED NEUTROPHILS    Immature Granulocytes 2 %   Abs Immature Granulocytes 0.53 (H) 0.00 - 0.07 K/uL   Polychromasia PRESENT     Comment: Performed at [facility], [place] [address], [place], [place] [address]  Protime-INR  Status: Abnormal   Collection Time: 03/16/19 11:59 PM  Result Value Ref Range   Prothrombin Time 44.4 (H) 11.4 - 15.2 seconds   INR 4.7 (HH) 0.8 - 1.2    Comment: REPEATED TO VERIFY CRITICAL RESULT CALLED TO, READ BACK BY AND VERIFIED WITH: GIBSON K, RN AT 0041 ON 03/17/2019 BY SAINVILUS S (NOTE) INR goal varies based on device and disease states. Performed at [facility], [place] [address], [place], [place] [address]   Lactic acid, plasma     Status: Abnormal   Collection Time: 03/16/19 11:59 PM  Result Value Ref Range   Lactic Acid, Venous 2.8 (HH) 0.5 - 1.9 mmol/L    Comment: CRITICAL RESULT CALLED TO, READ BACK BY AND VERIFIED WITH: Neldon Labella 12/07/200031 WAYK Performed at [facility], [place] [address] [place], [place] [address]   Brain natriuretic peptide     Status: Abnormal   Collection Time: 03/16/19 11:59 PM  Result Value Ref Range   B Natriuretic Peptide 105.7 (H) 0.0 - 100.0 pg/mL    Comment: Performed at [facility] [address] [place], [place] [address]  POC occult blood, ED     Status: Abnormal   Collection Time: 03/17/19 12:01 AM  Result Value Ref Range   Fecal Occult Bld POSITIVE (A) NEGATIVE  Type and screen     Status: None   Collection Time: 03/17/19 12:05 AM  Result Value Ref Range   ABO/RH(D) B POS    Antibody Screen NEG    Sample Expiration      03/20/2019,2359 Performed at [facility], [place] [address] [place], [place] [address]   POC SARS Coronavirus 2 Ag-ED -     Status: None   Collection Time: 03/17/19 12:16 AM  Result Value Ref Range   SARS Coronavirus 2 Ag NEGATIVE NEGATIVE    Comment: (NOTE) SARS-CoV-2 antigen NOT DETECTED.  Negative results are presumptive.  Negative results  do not preclude SARS-CoV-2 infection and should not be used as the sole basis for treatment or other patient management decisions, including infection  control decisions, particularly in the presence of clinical signs and  symptoms consistent with COVID-19, or in those who have been in contact with the virus.  Negative results must be combined with clinical observations, patient history, and epidemiological information. The expected result is Negative. Fact Sheet for Patients: PodPark.tn Fact Sheet for Healthcare Providers: GiftContent.is This test is not yet approved or cleared by the [place] FDA and  has been authorized for detection and/or diagnosis of SARS-CoV-2 by FDA under an Emergency Use Authorization (EUA).  This EUA will remain in effect (meaning this test can be used) for the duration of  the COVID-19 de claration under Section 564(b)(1) of the Act, 21 U.S.C. section 360bbb-3(b)(1), unless the authorization is terminated or revoked sooner.   SARS CORONAVIRUS 2 (TAT 6-24 HRS) Nasopharyngeal Nasopharyngeal Swab     Status: None   Collection Time: 03/17/19 12:32 AM   Specimen: Nasopharyngeal Swab  Result Value Ref Range   SARS Coronavirus 2 NEGATIVE NEGATIVE    Comment: (NOTE) SARS-CoV-2 target nucleic acids are NOT DETECTED. The SARS-CoV-2 RNA is generally detectable in upper and lower respiratory specimens during the acute phase of infection. Negative results do not preclude SARS-CoV-2 infection, do not rule out co-infections with other pathogens, and should not be used as the sole basis for treatment or other patient management decisions. Negative results must be combined with clinical observations,  patient history, and epidemiological information. The expected result is Negative. Fact Sheet for Patients: SugarRoll.be Fact Sheet for Healthcare  Providers: https://www.woods-mathews.com/ This test is not yet approved or cleared by the [place] FDA and  has been authorized for detection and/or diagnosis of SARS-CoV-2 by FDA under an Emergency Use Authorization (EUA). This EUA will remain  in effect (meaning this test can be used) for the duration of the COVID-19 declaration under Section 56 4(b)(1) of the Act, 21 U.S.C. section 360bbb-3(b)(1), unless the authorization is terminated or revoked sooner. Performed at [facility], [place] [address], [place], [place] [address]   Urinalysis, Routine w reflex microscopic     Status: Abnormal   Collection Time: 03/17/19 12:54 AM  Result Value Ref Range   Color, Urine YELLOW YELLOW   APPearance CLOUDY (A) CLEAR   Specific Gravity, Urine 1.006 1.005 - 1.030   pH 6.0 5.0 - 8.0   Glucose, UA NEGATIVE NEGATIVE mg/dL   Hgb urine dipstick SMALL (A) NEGATIVE   Bilirubin Urine NEGATIVE NEGATIVE   Ketones, ur NEGATIVE NEGATIVE mg/dL   Protein, ur 30 (A) NEGATIVE mg/dL   Nitrite NEGATIVE NEGATIVE   Leukocytes,Ua LARGE (A) NEGATIVE   RBC / HPF 6-10 0 - 5 RBC/hpf   WBC, UA >50 (H) 0 - 5 WBC/hpf   Bacteria, UA RARE (A) NONE SEEN   WBC Clumps PRESENT     Comment: Performed at [facility], [address] [place], [place] [address]   Dg Chest Port 1 View  Result Date: 03/16/2019 CLINICAL DATA:  Shortness of breath EXAM: PORTABLE CHEST 1 VIEW COMPARISON:  July 10, 2018 FINDINGS: The heart size is significantly enlarged. The patient is status post prior median sternotomy. Aortic calcifications are noted. There is a multi lead right-sided pacemaker in place. There is vascular congestion without overt pulmonary edema. There is no pneumothorax or large pleural effusion. There is no acute osseous abnormality. IMPRESSION: Cardiomegaly with mild volume overload. Electronically Signed   By: Constance Holster M.D.   On: 03/16/2019 23:50    Pending Labs Unresulted Labs (From  admission, onward)    Start     Ordered   03/18/19 4128  Basic metabolic panel  Daily,   R     03/17/19 0240   03/17/19 0500  CBC  Tomorrow morning,   R     03/17/19 0240   03/17/19 0500  Protime-INR  Tomorrow morning,   R     03/17/19 0240   03/16/19 2340  Blood culture (routine x 2)  BLOOD CULTURE X 2,   STAT     03/16/19 2339   03/16/19 2336  Urine culture  ONCE - STAT,   STAT     03/16/19 2336          Vitals/Pain Today's Vitals   03/17/19 0342 03/17/19 0349 03/17/19 0530 03/17/19 0540  BP: (!) 100/40 (!) 142/80 (!) 122/95   Pulse: (!) 52  63 (!) 59  Resp: 17  17 17   Temp:      TempSrc:      SpO2: 94%   93%  PainSc:        Isolation Precautions No active isolations  Medications Medications  cefTRIAXone (ROCEPHIN) 1 g in sodium chloride 0.9 % 100 mL IVPB (has no administration in time range)  acetaminophen (TYLENOL) tablet 650 mg (has no administration in time range)  psyllium (HYDROCIL/METAMUCIL) packet 1 packet (has no administration in time range)  hydrocortisone (ANUSOL-HC) suppository  25 mg (has no administration in time range)  furosemide (LASIX) injection 40 mg (0 mg Intravenous Hold 03/17/19 0528)  fentaNYL (SUBLIMAZE) injection 100 mcg (100 mcg Intravenous Given 03/17/19 0125)  cefTRIAXone (ROCEPHIN) 1 g in sodium chloride 0.9 % 100 mL IVPB (0 g Intravenous Stopped 03/17/19 0214)    Mobility walks with person assist High fall risk   Focused Assessments Cardiac Assessment Handoff:  Cardiac Rhythm: Ventricular paced Lab Results  Component Value Date   CKTOTAL 102 11/18/2007   TROPONINI 0.04 ([place]) 07/27/2017   No results found for: DDIMER Does the Patient currently have chest pain? No     R Recommendations: See Admitting Provider Note  Report given to:   Additional Notes:

## 2019-03-17 NOTE — Progress Notes (Signed)
PHARMACY - PHYSICIAN COMMUNICATION CRITICAL VALUE ALERT - BLOOD CULTURE IDENTIFICATION (BCID)  Frank [name] is an 73 y.o. male who presented to [facility] on 03/16/2019 with a chief complaint of rectal bleeding and shortness of breath.   Assessment:  73 year old male admitted with rectal bleeding. Now with gram positive cocci in chains in 3/4 blood cultures. Note patient does have a history of PPM and mechanical mitral valve. Per BCID this is  a strep species.   Name of physician (or Provider) ContactedMaylene Roes  - earlier today by ID pharmacist  Current antibiotics: Ceftriaxone   Changes to prescribed antibiotics recommended:  Increase to ceftriaxone 2 gm every 24 hours  - per ID Team  Updated note with BCID results  [name] Pharm.D. CPP, BCPS Clinical Pharmacist [phone] 03/17/2019 10:31 PM        Results for orders placed or performed during the hospital encounter of 03/16/19  Blood Culture ID Panel (Reflexed) (Collected: 03/16/2019 11:59 PM)  Result Value Ref Range   Enterococcus species NOT DETECTED NOT DETECTED   Listeria monocytogenes NOT DETECTED NOT DETECTED   Staphylococcus species NOT DETECTED NOT DETECTED   Staphylococcus aureus (BCID) NOT DETECTED NOT DETECTED   Streptococcus species DETECTED (A) NOT DETECTED   Streptococcus agalactiae NOT DETECTED NOT DETECTED   Streptococcus pneumoniae NOT DETECTED NOT DETECTED   Streptococcus pyogenes NOT DETECTED NOT DETECTED   Acinetobacter baumannii NOT DETECTED NOT DETECTED   Enterobacteriaceae species NOT DETECTED NOT DETECTED   Enterobacter cloacae complex NOT DETECTED NOT DETECTED   Escherichia coli NOT DETECTED NOT DETECTED   Klebsiella oxytoca NOT DETECTED NOT DETECTED   Klebsiella pneumoniae NOT DETECTED NOT DETECTED   Proteus species NOT DETECTED NOT DETECTED   Serratia marcescens NOT DETECTED NOT DETECTED   Haemophilus influenzae NOT DETECTED NOT DETECTED   Neisseria meningitidis NOT DETECTED NOT  DETECTED   Pseudomonas aeruginosa NOT DETECTED NOT DETECTED   Candida albicans NOT DETECTED NOT DETECTED   Candida glabrata NOT DETECTED NOT DETECTED   Candida krusei NOT DETECTED NOT DETECTED   Candida parapsilosis NOT DETECTED NOT DETECTED   Candida tropicalis NOT DETECTED NOT DETECTED

## 2019-03-17 NOTE — Progress Notes (Signed)
Lower extremity venous has been completed.   Preliminary results in CV Proc.   Abram Sander 03/17/2019 11:12 AM

## 2019-03-17 NOTE — ED Notes (Signed)
Dr. Marlowe Sax contacted to change patient's bed status to no longer be PUI since covid result is negative.

## 2019-03-17 NOTE — ED Notes (Signed)
ED Provider at bedside. 

## 2019-03-17 NOTE — Progress Notes (Signed)
PHARMACY - PHYSICIAN COMMUNICATION CRITICAL VALUE ALERT - BLOOD CULTURE IDENTIFICATION (BCID)  Frank [name] is an 73 y.o. male who presented to [facility] on 03/16/2019 with a chief complaint of rectal bleeding and shortness of breath.   Assessment:  73 year old male admitted with rectal bleeding. Now with gram positive cocci in chains in 1/4 blood cultures. Note patient does have a history of PPM and mechanical mitral valve. This is likely a strep or an enterococcus species.   Name of physician (or Provider) ContactedMaylene Roes   Current antibiotics: Ceftriaxone   Changes to prescribed antibiotics recommended:  Increase to ceftriaxone 2 gm every 24 hours  - per ID Team  Updated note with BCID results  [name] Pharm.D. CPP, BCPS Clinical Pharmacist [phone] 03/17/2019 6:10 PM        Results for orders placed or performed during the hospital encounter of 03/16/19  Blood Culture ID Panel (Reflexed) (Collected: 03/16/2019 11:59 PM)  Result Value Ref Range   Enterococcus species NOT DETECTED NOT DETECTED   Listeria monocytogenes NOT DETECTED NOT DETECTED   Staphylococcus species NOT DETECTED NOT DETECTED   Staphylococcus aureus (BCID) NOT DETECTED NOT DETECTED   Streptococcus species DETECTED (A) NOT DETECTED   Streptococcus agalactiae NOT DETECTED NOT DETECTED   Streptococcus pneumoniae NOT DETECTED NOT DETECTED   Streptococcus pyogenes NOT DETECTED NOT DETECTED   Acinetobacter baumannii NOT DETECTED NOT DETECTED   Enterobacteriaceae species NOT DETECTED NOT DETECTED   Enterobacter cloacae complex NOT DETECTED NOT DETECTED   Escherichia coli NOT DETECTED NOT DETECTED   Klebsiella oxytoca NOT DETECTED NOT DETECTED   Klebsiella pneumoniae NOT DETECTED NOT DETECTED   Proteus species NOT DETECTED NOT DETECTED   Serratia marcescens NOT DETECTED NOT DETECTED   Haemophilus influenzae NOT DETECTED NOT DETECTED   Neisseria meningitidis NOT DETECTED NOT DETECTED   Pseudomonas  aeruginosa NOT DETECTED NOT DETECTED   Candida albicans NOT DETECTED NOT DETECTED   Candida glabrata NOT DETECTED NOT DETECTED   Candida krusei NOT DETECTED NOT DETECTED   Candida parapsilosis NOT DETECTED NOT DETECTED   Candida tropicalis NOT DETECTED NOT DETECTED

## 2019-03-17 NOTE — ED Notes (Signed)
SDU  Breakfast ordered  

## 2019-03-17 NOTE — ED Notes (Signed)
Pt stating that the EMS en route gave him liquid cherry tylenol for fever, unknown amount

## 2019-03-17 NOTE — Progress Notes (Signed)
CRITICAL VALUE ALERT  Critical Value:  INR 5.2  Date & Time Notied:  03/17/19 5537  Provider Notified: MD paged  Orders Received/Actions taken:

## 2019-03-17 NOTE — Progress Notes (Signed)
Patient voided 75 ml urine at this time. Sent to lab for culture. Bladder scan done. Post residual 1 ml. MD aware. Will continue to monitor.  Emelda Fear, RN

## 2019-03-18 DIAGNOSIS — K921 Melena: Secondary | ICD-10-CM

## 2019-03-18 DIAGNOSIS — Z87891 Personal history of nicotine dependence: Secondary | ICD-10-CM

## 2019-03-18 DIAGNOSIS — D72829 Elevated white blood cell count, unspecified: Secondary | ICD-10-CM

## 2019-03-18 DIAGNOSIS — I890 Lymphedema, not elsewhere classified: Secondary | ICD-10-CM

## 2019-03-18 DIAGNOSIS — F329 Major depressive disorder, single episode, unspecified: Secondary | ICD-10-CM

## 2019-03-18 DIAGNOSIS — K648 Other hemorrhoids: Secondary | ICD-10-CM

## 2019-03-18 DIAGNOSIS — Z7901 Long term (current) use of anticoagulants: Secondary | ICD-10-CM

## 2019-03-18 DIAGNOSIS — I4891 Unspecified atrial fibrillation: Secondary | ICD-10-CM

## 2019-03-18 DIAGNOSIS — R011 Cardiac murmur, unspecified: Secondary | ICD-10-CM

## 2019-03-18 DIAGNOSIS — R509 Fever, unspecified: Secondary | ICD-10-CM

## 2019-03-18 LAB — BASIC METABOLIC PANEL
Anion gap: 12 (ref 5–15)
BUN: 79 mg/dL — ABNORMAL HIGH (ref 8–23)
CO2: 25 mmol/L (ref 22–32)
Calcium: 8.8 mg/dL — ABNORMAL LOW (ref 8.9–10.3)
Chloride: 92 mmol/L — ABNORMAL LOW (ref 98–111)
Creatinine, Ser: 3.53 mg/dL — ABNORMAL HIGH (ref 0.61–1.24)
GFR calc Af Amer: 19 mL/min — ABNORMAL LOW (ref >60)
GFR calc non Af Amer: 17 mL/min — ABNORMAL LOW (ref >60)
Glucose, Bld: 90 mg/dL (ref 70–99)
Potassium: 5.1 mmol/L (ref 3.5–5.1)
Sodium: 129 mmol/L — ABNORMAL LOW (ref 135–145)

## 2019-03-18 LAB — PROTIME-INR
INR: 6.3 (ref 0.8–1.2)
Prothrombin Time: 55.9 seconds — ABNORMAL HIGH (ref 11.4–15.2)

## 2019-03-18 LAB — CBC
HCT: 24.3 % — ABNORMAL LOW (ref 39.0–52.0)
Hemoglobin: 7.9 g/dL — ABNORMAL LOW (ref 13.0–17.0)
MCH: 32.1 pg (ref 26.0–34.0)
MCHC: 32.5 g/dL (ref 30.0–36.0)
MCV: 98.8 fL (ref 80.0–100.0)
Platelets: 195 10*3/uL (ref 150–400)
RBC: 2.46 MIL/uL — ABNORMAL LOW (ref 4.22–5.81)
RDW: 17 % — ABNORMAL HIGH (ref 11.5–15.5)
WBC: 36.1 10*3/uL — ABNORMAL HIGH (ref 4.0–10.5)
nRBC: 0 % (ref 0.0–0.2)

## 2019-03-18 LAB — URINE CULTURE

## 2019-03-18 MED ORDER — WITCH HAZEL-GLYCERIN EX PADS
MEDICATED_PAD | CUTANEOUS | Status: DC | PRN
  Filled 2019-03-18: qty 100

## 2019-03-18 MED ORDER — VITAMIN K1 10 MG/ML IJ SOLN
2.0000 mg | Freq: Once | INTRAVENOUS | Status: AC
  Administered 2019-03-18: 2 mg via INTRAVENOUS
  Filled 2019-03-18: qty 0.2

## 2019-03-18 MED ORDER — SODIUM CHLORIDE 0.9 % IV SOLN
INTRAVENOUS | Status: DC
  Administered 2019-03-18: 08:00:00 via INTRAVENOUS

## 2019-03-18 NOTE — Evaluation (Signed)
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 Found with sepsis from UTI and possible cellulitis of R LE, CHF.  Clinical Impression  Patient presents with generalized weakness, decreased activity tolerance, RLE pain and impaired mobility s/p above. Pt reports being independent PTA and furniture walking at baseline; lives with wife who works. Mobility session limited today due to hypotension. Pt asymptomatic throughout but reporting weakness. Sp02 remained >92% on RA throughout.  BP sitting in recliner 88/68 Standing BP 87/30 (46) Seated in recliner BP 77/52 (after exercises) Supine BP 75/65 (69)- RN aware, pt asymptomatic  Pt tolerated standing with supervision for safety and taking a few steps in room to get back to bed due to low BP. Will follow acutely to maximize independence and mobility prior to return home.     Follow Up Recommendations Home health PT;Supervision - Intermittent    Equipment Recommendations  Rolling walker with 5" wheels    Recommendations for Other Services       Precautions / Restrictions Precautions Precautions: Fall Precaution Comments: watch BP Restrictions Weight Bearing Restrictions: No      Mobility  Bed Mobility Overal bed mobility: Needs Assistance Bed Mobility: Sit to Supine       Sit to supine: Min guard   General bed mobility comments: increased time and effort, min guard for safety   Transfers Overall transfer level: Needs assistance Equipment used: Rolling walker (2 wheeled) Transfers: Sit to/from Stand Sit to Stand: Supervision          General transfer comment: close supervision for safety, no phyiscal assist required. Stood from Automotive engineer. BP dropped.  Ambulation/Gait Ambulation/Gait assistance: Min guard Gait Distance (Feet): 5 Feet Assistive device: Rolling walker (2 wheeled) Gait Pattern/deviations: Wide base of support;Step-to pattern;Shuffle Gait velocity: decreased   General Gait Details: Able to take a few steps to get to bed pushing RW to the side and grabbing bed rail (furniture walker at home). Limited due to hypotension.  Stairs            Wheelchair Mobility    Modified Rankin (Stroke Patients Only)       Balance Overall balance assessment: Needs assistance Sitting-balance support: Feet supported;No upper extremity supported Sitting balance-Leahy Scale: Good     Standing balance support: Bilateral upper extremity supported;Single extremity supported;During functional activity Standing balance-Leahy Scale: Fair Standing balance comment: relaint on at least 1 UE support dynamically, able to stand statically with 0 hand support. Practiced marching in standing x15 to improve BP as well as "raise the roof"                             Pertinent Vitals/Pain Pain Assessment: Faces Faces Pain Scale: Hurts little more Pain Location: R LE when placed on recliner leg rest Pain Descriptors / Indicators: Discomfort;Grimacing;Pressure;Sore Pain Intervention(s): Repositioned;Monitored during session;Limited activity within patient's tolerance    Home Living Family/patient expects to be discharged to:: Private residence Living Arrangements: Spouse/significant other Available Help at Discharge: Family(spouse works during the day) Type of Home: House Home Access: Stairs to enter Entrance Stairs-Rails: Left Entrance Stairs-Number of Steps:  7 Home Layout: One level Home Equipment: Walker - 2 wheels      Prior Function Level of Independence: Independent         Comments:  independent ADLs, limited IADLs, furniture walking as needed     Hand Dominance   Dominant Hand: Right    Extremity/Trunk Assessment   Upper Extremity Assessment Upper Extremity Assessment: Defer to OT evaluation    Lower Extremity Assessment Lower Extremity Assessment: Generalized weakness;RLE deficits/detail RLE Deficits / Details: Swelling and redness present; painful to touch esp with pressure       Communication   Communication: No difficulties  Cognition Arousal/Alertness: Awake/alert Behavior During Therapy: WFL for tasks assessed/performed Overall Cognitive Status: Within Functional Limits for tasks assessed                                 General Comments: appears [facility]       General Comments General comments (skin integrity, edema, etc.): Pt on RA during session, removed 3L 02 and maintained >92%. BP sitting in recliner 88/68, standing BP 87/30 (46), seated in recliner 77/52 (after exercises), Supine 75/65 (69)- RN aware.    Exercises     Assessment/Plan    PT Assessment Patient needs continued PT services  PT Problem List Decreased strength;Decreased mobility;Pain;Decreased balance;Decreased knowledge of use of DME;Decreased activity tolerance;Cardiopulmonary status limiting activity;Decreased skin integrity       PT Treatment Interventions Therapeutic activities;DME instruction;Gait training;Therapeutic exercise;Patient/family education;Balance training;Functional mobility training;Stair training    PT Goals (Current goals can be found in the Care Plan section)  Acute Rehab PT Goals Patient Stated Goal: to get back home  PT Goal Formulation: With patient Time For Goal Achievement: 04/01/19 Potential to Achieve Goals: Good    Frequency Min 3X/week   Barriers to discharge Inaccessible home environment stairs to enter home    Co-evaluation PT/OT/SLP Co-Evaluation/Treatment: Yes Reason for Co-Treatment: For patient/therapist safety;To  address functional/ADL transfers(decreased tolerance/hypotension) PT goals addressed during session: Mobility/safety with mobility         AM-PAC PT "6 Clicks" Mobility  Outcome Measure Help needed turning from your back to your side while in a flat bed without using bedrails?: A Little Help needed moving from lying on your back to sitting on the side of a flat bed without using bedrails?: A Little Help needed moving to and from a bed to a chair (including a wheelchair)?: A Little Help needed standing up from a chair using your arms (e.g., wheelchair or bedside chair)?: A Little Help needed to walk in hospital room?: A Little Help needed climbing 3-5 steps with a railing? : A Lot 6 Click Score: 17    End of Session   Activity Tolerance: Treatment limited secondary to medical complications (Comment)(hypotension') Patient left: in bed;with call bell/phone within reach;with bed alarm set Nurse Communication: Mobility status;Other (comment)(BP) PT Visit Diagnosis: Muscle weakness (generalized) (M62.81);Difficulty in walking, not elsewhere classified (R26.2);Pain Pain - Right/Left: Right Pain - part of body: Leg    Time: 0569-7948 PT Time Calculation (min) (ACUTE ONLY): 25 min   Charges:   PT Evaluation $PT Eval Moderate Complexity: 1 Mod          Marisa Severin, PT, DPT Acute Rehabilitation Services Pager [phone] Office [phone]      Frank [name] 03/18/2019, 1:02 PM

## 2019-03-18 NOTE — Progress Notes (Addendum)
PROGRESS NOTE    Frank [name]  JSH:[id number] DOB: 03/18/1946 DOA: 03/16/2019 PCP: [name], MD     Brief Narrative:  Lyda Perone Morphewis a 73 y.o.malewith medical history significant of diverticulosis, hemorrhoids, paroxysmal A. 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 Patient states he has chronic shortness of breath which is now worse. He is complaining of pain in his right lower extremity. States his right leg is chronically very swollen and painful. States he had a colonoscopy a month ago and was told he had rectal bleeding due to hemorrhoids. He continues to have intermittent rectal bleeding. He notices bright red blood with bowel movements. He goes to a Coumadin clinic and takes Coumadin 15 mg on Sunday and Wednesday, 10 mg the remaining 5 days of the week.  Upon presentation to the emergency department, patient was found to be febrile, hypoxemic.  Covid test was negative.  UA revealed concern for UTI and patient was started on Rocephin.  New events last 24 hours / Subjective: No new complaints.  He states that breathing has improved since admission.  Continues to have lower extremity edema, which he says it is chronic over the last 30 years and no worse than his baseline.  Also has chronic cough, which his primary care physician has prescribed PPI.  No further rectal bleeding since admission.  Assessment & Plan:   Principal Problem:   UTI (urinary tract infection) Active Problems:   Sepsis ([facility])   Acute respiratory failure with hypoxia ([facility])   CHF exacerbation ([facility])   AKI (acute kidney injury) ([facility])    Sepsis secondary to bacteremia   -Urine culture pending -Continue Rocephin -WBC trending down -Addendum: notified by pharmacist, patient's blood cultures today growing group G strep. Echo ordered. Consulted ID  Acute hypoxic respiratory failure secondary to acute exacerbation of chronic combinedCHF -Chest x-ray showed cardiomegaly with mild volume overload -Remains on 3 L nasal cannula O2 this morning -Intake and output, daily weights, heart healthy diet (declined fluid restriction)  -Gave 1 dose IV lasix and now patient's BP too low to trial another dose  -Monitor closely   AKI -Baseline creatinine was ranging between 1.2-1.6  -Avoid nephrotoxic agents -Monitor urine output -Hold Cozaar, Aldactone, diuretics  -Patient with hypotension, worsening kidney function, clinically appears dehydrated although has lower extremity edema (which is chronic and not any worse from baseline, per patient report).  Trial IV fluid today and continue to monitor BMP  Chronic right lower extremity edema and pain -This appears to be chronic in nature, states that this has been this way since 1990s -DVT ultrasound negative for DVT   History of mechanical mitral valve on chronic anticoagulation with Jantoven -Goal INR 2.5-3.5 -Supratherapeutic INR, given vitamin K -Monitor daily INR, pharmacy consulted  Paroxysmal atrial fibrillation -Pharmacy to follow for Coumadin/INR  Rectal bleeding -Suspect related to hemorrhoids and anticoagulation/supratherapeutic INR. Colonoscopy done 11/30 showing external and internal hemorrhoids, multiple diverticula in the left colon. FOBT positive. Hemoglobin 8.9, stable since labs done 5 days ago.  -Anusol HC suppository -Continue to monitor hemoglobin -No further episodes during hospitalization    DVT prophylaxis: SCDs. Holding coumadin due to supratherapeutic INR  Code Status: Full Family Communication: None at bedside Disposition Plan: Pending clinical improvement,  PT OT     Consultants:   none  Procedures:   none  Antimicrobials:  Anti-infectives (From admission, onward)   Start     Dose/Rate Route Frequency Ordered Stop   03/17/19 2200  cefTRIAXone (ROCEPHIN) 1 g in sodium chloride 0.9 % 100 mL IVPB  Status:  Discontinued     1 g 200 mL/hr over 30 Minutes Intravenous Every 24 hours 03/17/19 0240 03/17/19 1536   03/17/19 2200  cefTRIAXone (ROCEPHIN) 2 g in sodium chloride 0.9 % 100 mL IVPB     2 g 200 mL/hr over 30 Minutes Intravenous Every 24 hours 03/17/19 1536     03/17/19 0130  cefTRIAXone (ROCEPHIN) 1 g in sodium chloride 0.9 % 100 mL IVPB     1 g 200 mL/hr over 30 Minutes Intravenous  Once 03/17/19 0123 03/17/19 0214        Objective: Vitals:   03/18/19 0700 03/18/19 0800 03/18/19 0834 03/18/19 0837  BP:   (!) 80/32 (!) 89/43  Pulse:      Resp: 19 (!) 23 18 14   Temp:      TempSrc:      SpO2: 100% (!) 78%  100%  Weight:      Height:        Intake/Output Summary (Last 24 hours) at 03/18/2019 1022 Last data filed at 03/18/2019 0800 Gross per 24 hour  Intake 1350 ml  Output 276 ml  Net 1074 ml   Filed Weights   03/17/19 0653 03/18/19 0407  Weight: (!) 151.1 kg (!) 153.8 kg    Examination:  General exam: Appears calm and comfortable  Respiratory system: Clear to auscultation. Respiratory effort normal. No respiratory distress. No conversational dyspnea. On 3L [place] O2  Cardiovascular system: S1 & S2 heard. No murmurs. +non pitting pedal edema. Gastrointestinal system: Abdomen is nondistended, soft and nontender. Normal bowel sounds heard. Central nervous system: Alert and oriented. No focal neurological deficits. Speech clear.  Extremities: Symmetric in appearance  Skin: +erythema over right shin, patient states it might be slightly worse than baseline but it is always red and painful >30 years Psychiatry: Judgement and insight appear normal. Mood & affect appropriate.   Data Reviewed: I have personally reviewed following labs  and imaging studies  CBC: Recent Labs  Lab 03/12/19 1634 03/16/19 2359 03/17/19 0743 03/18/19 0310  WBC 7.2 29.8* 42.8* 36.1*  NEUTROABS  --  26.6*  --   --   HGB 8.9 Repeated and verified X2.* 8.9* 8.3* 7.9*  HCT 26.9 Repeated and verified X2.* 28.7* 27.0* 24.3*  MCV 95.6 101.8* 103.1* 98.8  PLT 242.0 270 224 277   Basic Metabolic Panel: Recent Labs  Lab 03/16/19 2359 03/17/19 0743 03/18/19 0310  NA 135 135 129*  K 4.3 4.6 5.1  CL 98 97* 92*  CO2 24 18* 25  GLUCOSE 100* 90 90  BUN 54* 61* 79*  CREATININE 2.54* 3.05* 3.53*  CALCIUM 9.5 9.1 8.8*   GFR: Estimated Creatinine Clearance: 29.4 mL/min (A) (by C-G formula based on SCr of 3.53 mg/dL (H)). Liver Function Tests: No results for input(s): AST, ALT, ALKPHOS, BILITOT, PROT, ALBUMIN in the last 168 hours. No results for input(s): LIPASE, AMYLASE in the last 168 hours. No results for input(s): AMMONIA in the last 168 hours. Coagulation Profile: Recent Labs  Lab 03/16/19 2359 03/17/19 0743 03/18/19 0310  INR 4.7* 5.2* 6.3*   Cardiac Enzymes: No results for input(s): CKTOTAL, CKMB, CKMBINDEX, TROPONINI in the last 168 hours. BNP (last  3 results) No results for input(s): PROBNP in the last 8760 hours. HbA1C: No results for input(s): HGBA1C in the last 72 hours. CBG: No results for input(s): GLUCAP in the last 168 hours. Lipid Profile: No results for input(s): CHOL, HDL, LDLCALC, TRIG, CHOLHDL, LDLDIRECT in the last 72 hours. Thyroid Function Tests: No results for input(s): TSH, T4TOTAL, FREET4, T3FREE, THYROIDAB in the last 72 hours. Anemia Panel: No results for input(s): VITAMINB12, FOLATE, FERRITIN, TIBC, IRON, RETICCTPCT in the last 72 hours. Sepsis Labs: Recent Labs  Lab 03/16/19 2359  LATICACIDVEN 2.8*    Recent Results (from the past 240 hour(s))  Blood culture (routine x 2)     Status: None (Preliminary result)   Collection Time: 03/16/19 11:59 PM   Specimen: BLOOD  Result Value Ref Range  Status   Specimen Description BLOOD RIGHT HAND  Final   Special Requests   Final    BOTTLES DRAWN AEROBIC AND ANAEROBIC Blood Culture results may not be optimal due to an inadequate volume of blood received in culture bottles   Culture  Setup Time   Final    GRAM POSITIVE COCCI IN CHAINS AEROBIC BOTTLE ONLY CRITICAL RESULT CALLED TO, READ BACK BY AND VERIFIED WITH: E. SINCLAIR, PHARMD AT 0623 ON 03/17/19 BY C. JESSUP, MT.    Culture   Final    BETA HEMOLYTIC ORGANISM BEING IDENTIFIED IDENTIFICATION TO FOLLOW Performed at [facility], [place] [address] [place], [place] [address]    Report Status PENDING  Incomplete  Blood culture (routine x 2)     Status: None (Preliminary result)   Collection Time: 03/16/19 11:59 PM   Specimen: BLOOD  Result Value Ref Range Status   Specimen Description BLOOD LEFT HAND  Final   Special Requests   Final    BOTTLES DRAWN AEROBIC AND ANAEROBIC Blood Culture adequate volume   Culture  Setup Time   Final    GRAM POSITIVE COCCI IN CHAINS IN BOTH AEROBIC AND ANAEROBIC BOTTLES CRITICAL RESULT CALLED TO, READ BACK BY AND VERIFIED WITHBronwen Betters PHARMD 1741 03/17/19 A BROWNING    Culture   Final    BETA HEMOLYTIC ORGANISM BEING IDENTIFIED IDENTIFICATION TO FOLLOW Performed at [facility], [place] [address] [place], [place] [address]    Report Status PENDING  Incomplete  Blood Culture ID Panel (Reflexed)     Status: Abnormal   Collection Time: 03/16/19 11:59 PM  Result Value Ref Range Status   Enterococcus species NOT DETECTED NOT DETECTED Final   Listeria monocytogenes NOT DETECTED NOT DETECTED Final   Staphylococcus species NOT DETECTED NOT DETECTED Final   Staphylococcus aureus (BCID) NOT DETECTED NOT DETECTED Final   Streptococcus species DETECTED (A) NOT DETECTED Final    Comment: Not Enterococcus species, Streptococcus agalactiae, Streptococcus pyogenes, or Streptococcus pneumoniae. CRITICAL RESULT CALLED TO, READ BACK BY AND VERIFIED  WITH: Bronwen Betters PHARMD 1741 03/17/19 A BROWNING    Streptococcus agalactiae NOT DETECTED NOT DETECTED Final   Streptococcus pneumoniae NOT DETECTED NOT DETECTED Final   Streptococcus pyogenes NOT DETECTED NOT DETECTED Final   Acinetobacter baumannii NOT DETECTED NOT DETECTED Final   Enterobacteriaceae species NOT DETECTED NOT DETECTED Final   Enterobacter cloacae complex NOT DETECTED NOT DETECTED Final   Escherichia coli NOT DETECTED NOT DETECTED Final   Klebsiella oxytoca NOT DETECTED NOT DETECTED Final   Klebsiella pneumoniae NOT DETECTED NOT DETECTED Final   Proteus species NOT DETECTED NOT DETECTED Final   Serratia marcescens NOT DETECTED NOT  DETECTED Final   Haemophilus influenzae NOT DETECTED NOT DETECTED Final   Neisseria meningitidis NOT DETECTED NOT DETECTED Final   Pseudomonas aeruginosa NOT DETECTED NOT DETECTED Final   Candida albicans NOT DETECTED NOT DETECTED Final   Candida glabrata NOT DETECTED NOT DETECTED Final   Candida krusei NOT DETECTED NOT DETECTED Final   Candida parapsilosis NOT DETECTED NOT DETECTED Final   Candida tropicalis NOT DETECTED NOT DETECTED Final    Comment: Performed at [facility], [place] [address] [place], [place] [address]  SARS CORONAVIRUS 2 (TAT 6-24 HRS) Nasopharyngeal Nasopharyngeal Swab     Status: None   Collection Time: 03/17/19 12:32 AM   Specimen: Nasopharyngeal Swab  Result Value Ref Range Status   SARS Coronavirus 2 NEGATIVE NEGATIVE Final    Comment: (NOTE) SARS-CoV-2 target nucleic acids are NOT DETECTED. The SARS-CoV-2 RNA is generally detectable in upper and lower respiratory specimens during the acute phase of infection. Negative results do not preclude SARS-CoV-2 infection, do not rule out co-infections with other pathogens, and should not be used as the sole basis for treatment or other patient management decisions. Negative results must be combined with clinical observations, patient history, and epidemiological  information. The expected result is Negative. Fact Sheet for Patients: SugarRoll.be Fact Sheet for Healthcare Providers: https://www.woods-mathews.com/ This test is not yet approved or cleared by the [place] FDA and  has been authorized for detection and/or diagnosis of SARS-CoV-2 by FDA under an Emergency Use Authorization (EUA). This EUA will remain  in effect (meaning this test can be used) for the duration of the COVID-19 declaration under Section 56 4(b)(1) of the Act, 21 U.S.C. section 360bbb-3(b)(1), unless the authorization is terminated or revoked sooner. Performed at [facility], [place] [address], [place], [place] [address]       Radiology Studies: Dg Chest Port 1 View  Result Date: 03/16/2019 CLINICAL DATA:  Shortness of breath EXAM: PORTABLE CHEST 1 VIEW COMPARISON:  July 10, 2018 FINDINGS: The heart size is significantly enlarged. The patient is status post prior median sternotomy. Aortic calcifications are noted. There is a multi lead right-sided pacemaker in place. There is vascular congestion without overt pulmonary edema. There is no pneumothorax or large pleural effusion. There is no acute osseous abnormality. IMPRESSION: Cardiomegaly with mild volume overload. Electronically Signed   By: Constance Holster M.D.   On: 03/16/2019 23:50   Vas [name] Lower Extremity Venous (dvt)  Result Date: 03/17/2019  Lower Venous Study Indications: Swelling, and Edema.  Limitations: Body habitus and poor ultrasound/tissue interface. Comparison Study: no prior Performing Technologist: Abram Sander RVS  Examination Guidelines: A complete evaluation includes B-mode imaging, spectral Doppler, color Doppler, and power Doppler as needed of all accessible portions of each vessel. Bilateral testing is considered an integral part of a complete examination. Limited examinations for reoccurring indications may be performed as noted.   +---------+---------------+---------+-----------+----------+--------------+ RIGHT    CompressibilityPhasicitySpontaneityPropertiesThrombus Aging +---------+---------------+---------+-----------+----------+--------------+ CFV      Full           Yes      Yes                                 +---------+---------------+---------+-----------+----------+--------------+ SFJ      Full                                                        +---------+---------------+---------+-----------+----------+--------------+  FV Prox  Full                                                        +---------+---------------+---------+-----------+----------+--------------+ FV Mid                  Yes      Yes                                 +---------+---------------+---------+-----------+----------+--------------+ FV Distal               Yes      Yes                                 +---------+---------------+---------+-----------+----------+--------------+ PFV      Full                                                        +---------+---------------+---------+-----------+----------+--------------+ POP      Full           Yes      Yes                                 +---------+---------------+---------+-----------+----------+--------------+ PTV                                                   Not visualized +---------+---------------+---------+-----------+----------+--------------+ PERO                                                  Not visualized +---------+---------------+---------+-----------+----------+--------------+   +----+---------------+---------+-----------+----------+--------------+ LEFTCompressibilityPhasicitySpontaneityPropertiesThrombus Aging +----+---------------+---------+-----------+----------+--------------+ CFV                Yes      Yes                                  +----+---------------+---------+-----------+----------+--------------+     Summary: Right: There is no evidence of deep vein thrombosis in the lower extremity. However, portions of this examination were limited- see technologist comments above. No cystic structure found in the popliteal fossa. Left: No evidence of common femoral vein obstruction.  *See table(s) above for measurements and observations. Electronically signed by Servando Snare MD on 03/17/2019 at 5:03:22 PM.    Final       Scheduled Meds: . ferrous sulfate  325 mg Oral Daily  . hydrocortisone  25 mg Rectal BID  . psyllium  1 packet Oral Daily  . Warfarin - Pharmacist Dosing Inpatient   Does not apply q1800   Continuous Infusions: . sodium chloride 75 mL/hr at 03/18/19 0824  . cefTRIAXone (ROCEPHIN)  IV Stopped (03/17/19 2154)  LOS: 1 day      Time spent: 35 minutes   [name], DO [facility] 03/18/2019, 10:22 AM   Available via Epic secure chat 7am-7pm After these hours, please refer to coverage provider listed on [email or web address]

## 2019-03-18 NOTE — Progress Notes (Signed)
ANTICOAGULATION CONSULT NOTE - Follow Up Consult  Pharmacy Consult for warfarin Indication: mech MVR (1999),  Afib (CHADS2VASc =6)   Allergies  Allergen Reactions  . Warfarin And Related Other (See Comments)    COUMADIN or JANTOVEN BRAND ONLY** Per pt, was switched to generic in 2001 and had significantly decreased absorption > INR subtherapeutic> pt suffered a TIA and has been on brand name only since      Patient Measurements: Height: 5\' 11"  (180.3 cm) Weight: (!) 339 lb (153.8 kg) IBW/kg (Calculated) : 75.3  Vital Signs: Temp: 97.8 F (36.6 C) (12/08 1159) Temp Source: Oral (12/08 1159) BP: 100/44 (12/08 1159) Pulse Rate: 61 (12/08 1159)  Labs: Recent Labs    03/16/19 2359 03/17/19 0743 03/18/19 0310  HGB 8.9* 8.3* 7.9*  HCT 28.7* 27.0* 24.3*  PLT 270 224 195  LABPROT 44.4* 48.2* 55.9*  INR 4.7* 5.2* 6.3*  CREATININE 2.54* 3.05* 3.53*    Estimated Creatinine Clearance: 29.4 mL/min (A) (by C-G formula based on SCr of 3.53 mg/dL (H)).  Assessment: 73 yo M on warfarin for mechMVR 1999 and Afib (CHADS2VASc =6) admitted with melena and INR of 5.2> inc 6.3 today despite holding warfarin doses - vit k 2mg  po x1 today  -f/u INR in am  Complains of hemorrhoidal bleeding, HGB fell slightly 8.9>7.9 pltc ok - watch  Of note, patient takes COUMADIN or JANTOVEN BRAND ONLY** Per pt, was switched to generic in 2001 and had significantly decreased absorption > INR subtherapeutic> pt suffered a TIA and has been on brand name only since then. Will have wife bring in home supply since we only have generic warfarin.    Home warfarin: 15mg  sun/wed, 10mg  AOD (stable dose since 2019). Per patient, has been drinking vinegar and eating more picked vegetables but otherwise no change in diet.  Goal of Therapy:  INR 2.5- 3.5 Monitor platelets by anticoagulation protocol: Yes   Plan:  Hold warfarin until INR is less than ~3.5   Will use patient's home supply when restart warfarin   [name] Pharm.D. CPP, BCPS Clinical Pharmacist [phone] 03/18/2019 3:02 PM     Please check AMION for all [place] phone numbers After 10:00 PM, call [place] [phone]

## 2019-03-18 NOTE — Progress Notes (Signed)
I.N.R. 6.3 Text page Forrest Moron N.P. results. R.N. aware

## 2019-03-18 NOTE — Evaluation (Signed)
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 Found with sepsis from UTI and possible cellulitis of R LE, CHF.   Clinical Impression   PTA patient reports independent with ADLs, limited IADLs, furniture walking around home. Admitted for above and limited by problem list below, including impaired balance, generalized weakness, decreased activity tolerance, R LE pain, and hypotension.  BP monitored throughout session (see below) limiting session, otherwise VSS with O2 initially on 3L but removed during session and SpO2 remained >92% on RA (replaced at end of session).  He requires min-mod assist for LB ADLs, setup for UB ADLs, min guard for limited in room mobility using RW and close supervision for basic sit to stand transfers.  Patient will benefit from continued OT services while admitted and after dc at [facility] level in order to optimize independence and safety with ADLs, IADls and mobility. Recommend initial 24/7 support for safety.   BP: seated in recliner before activity 88/68        Standing 87/30 (46)         Sitting 77/52 (59)        Supine  75/65 (69) --RN aware of BP and pt position supine in bed     Follow Up Recommendations  Home health OT;Supervision/Assistance - 24 hour(inital 24/7 support)    Equipment Recommendations  3 in 1 bedside commode    Recommendations for Other Services       Precautions / Restrictions Precautions Precautions: Fall Precaution Comments: watch BP Restrictions Weight Bearing Restrictions: No      Mobility Bed Mobility Overal bed mobility:  Needs Assistance Bed Mobility: Sit to Supine       Sit to supine: Min guard   General bed mobility comments: increased time and effort, min guard for safety   Transfers Overall transfer level: Needs assistance Equipment used: Rolling walker (2 wheeled) Transfers: Sit to/from Stand Sit to Stand: Supervision         General transfer comment: close supervision for safety, no phyiscal assist required    Balance Overall balance assessment: Needs assistance Sitting-balance support: No upper extremity supported;Feet supported Sitting balance-Leahy Scale: Good     Standing balance support: Bilateral upper extremity supported;Single extremity supported;During functional activity Standing balance-Leahy Scale: Fair Standing balance comment: relaint on at least 1 UE support dynamically, able to stand statically with 0 hand support                           ADL either performed or assessed with clinical judgement   ADL Overall ADL's : Needs assistance/impaired     Grooming: Set up;Sitting   Upper Body Bathing: Set up;Sitting   Lower Body Bathing: Minimal assistance;Sit to/from stand Lower Body Bathing Details (indicate cue type and reason): requires assist to reach B LEs Upper Body Dressing : Set up;Sitting   Lower Body Dressing: Minimal assistance;Sit to/from stand Lower Body Dressing Details (indicate cue type and reason): requires assist for socks  Toilet Transfer: Min guard;Ambulation;RW Toilet Transfer Details (indicate cue type and reason): min guard for safety, simulated from recliner to EOB  Functional mobility during ADLs: Min guard;Rolling walker General ADL Comments: pt limited by hypotension, decreased activity tolerance, weakness and impaired balance      Vision   Vision Assessment?: No apparent visual deficits     Perception     Praxis      Pertinent Vitals/Pain Pain Assessment: Faces Faces Pain Scale: Hurts little more Pain  Location: R LE  Pain Descriptors / Indicators: Discomfort;Grimacing;Pressure;Sore Pain Intervention(s): Limited activity within patient's tolerance;Monitored during session;Repositioned     Hand Dominance Right   Extremity/Trunk Assessment Upper Extremity Assessment Upper Extremity Assessment: Generalized weakness   Lower Extremity Assessment Lower Extremity Assessment: Defer to PT evaluation       Communication Communication Communication: No difficulties   Cognition Arousal/Alertness: Awake/alert Behavior During Therapy: WFL for tasks assessed/performed Overall Cognitive Status: Within Functional Limits for tasks assessed                                 General Comments: appears [facility]    General Comments  vitals monitored during session, initally on 3L supplemental O2 removed during session and Spo2 maintained >92% (reapplied once supine) BP soft: seated in recliner 88/68, standing 87/30 (46), seated recliner 77/52 (59), supine 75/65 (69)--RN aware      Exercises     Shoulder Instructions      Home Living Family/patient expects to be discharged to:: Private residence Living Arrangements: Spouse/significant other Available Help at Discharge: Family(spouse workds during the day ) Type of Home: House Home Access: Stairs to enter Technical brewer of Steps: 7 Entrance Stairs-Rails: Left Home Layout: One level     Bathroom Shower/Tub: Teacher, early years/pre: Standard     Home Equipment: Environmental consultant - 2 wheels          Prior Functioning/Environment Level of Independence: Independent        Comments: independent ADLs, limited IADLs, furniture walking as needed        OT Problem List: Decreased strength;Decreased activity tolerance;Impaired balance (sitting and/or standing);Decreased knowledge of use of DME or AE;Cardiopulmonary status limiting activity;Decreased knowledge of precautions;Pain;Obesity;Increased edema      OT  Treatment/Interventions: Self-care/ADL training;DME and/or AE instruction;Therapeutic activities;Patient/family education;Balance training;Therapeutic exercise;Energy conservation    OT Goals(Current goals can be found in the care plan section) Acute Rehab OT Goals Patient Stated Goal: to get back home  OT Goal Formulation: With patient Time For Goal Achievement: 04/01/19 Potential to Achieve Goals: Good  OT Frequency: Min 2X/week   Barriers to D/C:            Co-evaluation PT/OT/SLP Co-Evaluation/Treatment: Yes Reason for Co-Treatment: Other (comment);For patient/therapist safety;To address functional/ADL transfers(decreased tolerance/hypotension)   OT goals addressed during session: ADL's and self-care      AM-PAC OT "6 Clicks" Daily Activity     Outcome Measure Help from another person eating meals?: None Help from another person taking care of personal grooming?: A Little Help from another person toileting, which includes using toliet, bedpan, or urinal?: A Lot Help from another person bathing (including washing, rinsing, drying)?: A Little Help from another person to put on and taking off regular upper body clothing?: A Little Help from another person to put on and taking off regular lower body clothing?: A Lot 6 Click Score: 17   End of Session Equipment Utilized During Treatment: Rolling walker;Oxygen(3L) Nurse Communication: Mobility status;Other (comment)(BP )  Activity Tolerance: Treatment limited secondary to medical complications (Comment)(hypotension ) Patient  left: in bed;with call bell/phone within reach;with bed alarm set  OT Visit Diagnosis: Other abnormalities of gait and mobility (R26.89);Muscle weakness (generalized) (M62.81);Pain Pain - Right/Left: Right Pain - part of body: Leg                Time: 2824-1753 OT Time Calculation (min): 31 min Charges:  OT General Charges $OT Visit: 1 Visit OT Evaluation $OT Eval Moderate Complexity: Arthur, OT Acute Rehabilitation Services Pager [phone] Office [phone]   Delight Stare 03/18/2019, 11:20 AM

## 2019-03-18 NOTE — Consult Note (Signed)
[facility] for Infectious Disease       Reason for Consult: [name], MD    Referring Physician: Group G Strep bacteremia  Principal Problem:   UTI (urinary tract infection) Active Problems:   Sepsis ([place])   Acute respiratory failure with hypoxia ([place])   CHF exacerbation ([place])   AKI (acute kidney injury) ([place])   . ferrous sulfate  325 mg Oral Daily  . psyllium  1 packet Oral Daily  . Warfarin - Pharmacist Dosing Inpatient   Does not apply q1800    Recommendations: 1. Group G Strep bacteremia -the most likely source of the patient's bacteremia would be his right lower leg cellulitis.  The patient struggles with chronic lower extremity edema following his prior valve replacements/repairs.  Given his mechanical mitral valve and need for anticoagulation, would proceed with a transthoracic echocardiogram to evaluate for endocarditis.  If unrevealing, he will need a transesophageal echocardiogram to fully exclude this possibility.  Continue Rocephin 2 g IV daily for the present time and repeat blood cultures x2 to evaluate for clearance of his bacteremia.  Given his need for upcoming TEE, it may be best to begin the process of transitioning him from his Coumadin to heparin in anticipation of an upcoming procedure.  While possible, his current bacteremia is unlikely to be related to his colonoscopy performed 3 to 4 weeks ago given the pathogen isolated.  2. Leukocytosis -markedly pronounced at 42,800 early in this hospitalization.  This does raise suspicion for potential prosthetic valve endocarditis or other thrombotic event.  Would have a low threshold for checking a Doppler ultrasound of his lower extremities to rule out a concurrent DVT, particularly given the swelling noted to his right lower extremity.  Check CBC with differential daily until the patient has white blood cell count consistently.  Continue Rocephin as monotherapy for the current time.  3. Fever -most likely due  to the patient's advanced group G strep bacteremia.  As noted above, this is concerning for potential prosthetic valve endocarditis, so would proceed with TTE and if negative subsequent TEE.  Repeat blood cultures x2 for any fever greater than 100.5.  Please note that an appropriate response to such a marked fever would be a decline of 1 F/day while on successful treatment.  Assessment: The patient is a 73 year old white male with atrial fibrillation and a St. Jude's mechanical mitral valve as well as aortic valve repair, status post pacemaker who was admitted with shortness of breath, rectal bleeding, fever, leukocytosis, and subsequently found to have group G strep bacteremia.  Antibiotics: Ceftriaxone, day 3  HPI: Frank [name] is a 73 y.o. white male with Afib and St. Jude's mechanical mitral valve & aortic valve repair, s/p PM admitted on 03/17/2019 with SOB, rectal bleeding and fever. He underwent a colonoscopy ~1 month PTA for his rectal bleeding and he was found to have internal hemorrhoids. He states that the episodes of rectal bleeding are quite unusual for him.  He denies any right leg pain but was found to have significant erythema and cellulitis to his right leg upon my evaluation.  He does complain of chronic lymphedema to both of his lower extremities that has worsened in the last 2 days.  He denies any total falls over the past several weeks but does admit to one near fall in which he "fell forward onto the bed and went to sleep."  Blood cxs are now positive for Group G strep. His initial LA was  2.8. WBC was 42.8 on admission.  He was empirically started on Rocephin and is currently awaiting an echocardiogram.  His shortness of breath has marginally improved since his hospitalization.  Fever curve, WBC & Cr trends, imaging, cx results, and ABX usage all independently reviewed  Review of Systems:  Review of Systems  Constitutional: Positive for fever and malaise/fatigue. Negative for  chills and weight loss.  HENT: Negative for congestion, hearing loss, sinus pain and sore throat.   Eyes: Negative for blurred vision, photophobia and discharge.  Respiratory: Positive for shortness of breath. Negative for cough and hemoptysis.   Cardiovascular: Positive for leg swelling. Negative for chest pain, palpitations and orthopnea.  Gastrointestinal: Positive for blood in stool. Negative for abdominal pain, constipation, diarrhea, heartburn, nausea and vomiting.  Genitourinary: Negative for dysuria, flank pain, frequency and urgency.  Musculoskeletal: Positive for falls. Negative for back pain, joint pain and myalgias.  Skin: Negative for itching and rash.  Neurological: Positive for weakness. Negative for tremors, seizures and headaches.  Endo/Heme/Allergies: Negative for polydipsia. Does not bruise/bleed easily.  Psychiatric/Behavioral: Positive for depression. Negative for substance abuse. The patient is not nervous/anxious and does not have insomnia.      All other systems reviewed and are negative    Past Medical History:  Diagnosis Date  . Atrial arrhythmia    status post ablation in [place] with complication including damage to the aortic valve  . Atrial fibrillation ([place])   . AV block, 1st degree   . Bradycardia   . Cluster headache ~ 1980   "went away after I started taking one of the heart RXs" (06/29/2017)  . Diastolic CHF, chronic ([place])   . Diverticulosis   . History of gout   . HTN (hypertension)   . Hypertrophic cardiomyopathy ([place])    s/p myoectomy  . Infection   . Internal hemorrhoids   . Morbid obesity ([place])   . Morbid obesity with BMI of 45.0-49.9, adult ([place])   . Pacemaker  -SJM   . Skin cancer    "burned off RUE X 1; cut off nose X 2" (06/29/2017)  . TIA (transient ischemic attack) 2001  . Tubulovillous adenoma     Social History   Tobacco Use  . Smoking status: Former Smoker    Years: 10.00    Types: Cigarettes, Pipe, Cigars    Quit date:  04/10/1977    Years since quitting: 41.9  . Smokeless tobacco: Never Used  Substance Use Topics  . Alcohol use: Yes    Alcohol/week: 2.0 standard drinks    Types: 1 Glasses of wine, 1 Cans of beer per week  . Drug use: No    Family History  Problem Relation Age of Onset  . Heart disease Sister   . Uterine cancer Sister   . Obesity Sister        500+ lbs  . Heart disease Brother   . Heart disease Sister   . Heart disease Father   . Colon cancer Father 66  . Crohn's disease Paternal Grandmother      Current Facility-Administered Medications:  .  0.9 %  sodium chloride infusion, , Intravenous, Continuous, [name], DO, Last Rate: 75 mL/hr at 03/18/19 0824 .  acetaminophen (TYLENOL) tablet 650 mg, 650 mg, Oral, Q6H PRN, [name], MD, 650 mg at 03/17/19 2126 .  cefTRIAXone (ROCEPHIN) 2 g in sodium chloride 0.9 % 100 mL IVPB, 2 g, Intravenous, Q24H, Susa Raring, [place], Stopped at 03/17/19 2154 .  ferrous sulfate tablet 325 mg, 325 mg, Oral, Daily, [name], DO, 325 mg at 03/18/19 1123 .  psyllium (HYDROCIL/METAMUCIL) packet 1 packet, 1 packet, Oral, Daily, [name], MD, 1 packet at 03/17/19 0950 .  Warfarin - Pharmacist Dosing Inpatient, , Does not apply, q1800, [place] Jude, [facility] .  witch hazel-glycerin (TUCKS) pad, , Topical, PRN, [name], DO  Allergies  Allergen Reactions  . Warfarin And Related Other (See Comments)    COUMADIN or JANTOVEN BRAND ONLY** Per pt, was switched to generic in 2001 and had significantly decreased absorption > INR subtherapeutic> pt suffered a TIA and has been on brand name only since     Vitals:   03/18/19 1600 03/18/19 1635  BP:  (!) 105/57  Pulse:    Resp: 19 16  Temp:  98.5 F (36.9 C)  SpO2: 97% 98%  Pulse -   Physical Exam Gen: pleasant, obese, chronically ill, moderate respiratory distress, A&Ox 3 Head: NCAT, no temporal wasting evident EENT: PERRL, EOMI, MMM, adequate dentition Neck: supple,  mild JVD CV: NRRR, +dull mechanical click loudest at the LLSB, I/VI diastolic murmur at LUSB Pulm: CTA bilaterally but difficulty appreciating bases, no wheeze, +prostration with occasional abdominal and suprasternal retractions Abd: soft, obese with large pannus, NTND, +BS Extrems:  2+ pitting LE edema, 1+ pulses, chronic venous stasis dermatitis Skin: +RT distal leg erythema with slight weeping from limb, poor skin turgor, poor nail hygeine, no Janeway lesions or splinter hemorrhages Neuro: CN II-XII grossly intact, no focal neurologic deficits appreciated, gait was not assessed, A&Ox 3   Lab Results  Component Value Date   WBC 36.1 (H) 03/18/2019   HGB 7.9 (L) 03/18/2019   HCT 24.3 (L) 03/18/2019   MCV 98.8 03/18/2019   PLT 195 03/18/2019    Lab Results  Component Value Date   CREATININE 3.53 (H) 03/18/2019   BUN 79 (H) 03/18/2019   NA 129 (L) 03/18/2019   K 5.1 03/18/2019   CL 92 (L) 03/18/2019   CO2 25 03/18/2019    Lab Results  Component Value Date   ALT 15 (L) 01/18/2017   AST 30 01/18/2017   ALKPHOS 77 01/18/2017     Microbiology: Recent Results (from the past 240 hour(s))  Blood culture (routine x 2)     Status: Abnormal (Preliminary result)   Collection Time: 03/16/19 11:59 PM   Specimen: BLOOD  Result Value Ref Range Status   Specimen Description BLOOD RIGHT HAND  Final   Special Requests   Final    BOTTLES DRAWN AEROBIC AND ANAEROBIC Blood Culture results may not be optimal due to an inadequate volume of blood received in culture bottles   Culture  Setup Time   Final    GRAM POSITIVE COCCI IN CHAINS AEROBIC BOTTLE ONLY CRITICAL RESULT CALLED TO, READ BACK BY AND VERIFIED WITH: E. SINCLAIR, PHARMD AT 1530 ON 03/17/19 BY C. JESSUP, MT.    Culture (A)  Final    STREPTOCOCCUS GROUP G SUSCEPTIBILITIES TO FOLLOW Performed at [facility], [address] [place], [place] [address]    Report Status PENDING  Incomplete  Blood culture (routine x 2)      Status: Abnormal (Preliminary result)   Collection Time: 03/16/19 11:59 PM   Specimen: BLOOD  Result Value Ref Range Status   Specimen Description BLOOD LEFT HAND  Final   Special Requests   Final    BOTTLES DRAWN AEROBIC AND ANAEROBIC Blood Culture adequate volume   Culture  Setup Time   Final    GRAM POSITIVE COCCI IN CHAINS IN BOTH AEROBIC AND ANAEROBIC BOTTLES CRITICAL RESULT CALLED TO, READ BACK BY AND VERIFIED WITH: Bronwen Betters PHARMD 1741 03/17/19 A BROWNING    Culture (A)  Final    STREPTOCOCCUS GROUP G SUSCEPTIBILITIES TO FOLLOW Performed at [facility], [place] [address] [place], [place] [address]    Report Status PENDING  Incomplete  Blood Culture ID Panel (Reflexed)     Status: Abnormal   Collection Time: 03/16/19 11:59 PM  Result Value Ref Range Status   Enterococcus species NOT DETECTED NOT DETECTED Final   Listeria monocytogenes NOT DETECTED NOT DETECTED Final   Staphylococcus species NOT DETECTED NOT DETECTED Final   Staphylococcus aureus (BCID) NOT DETECTED NOT DETECTED Final   Streptococcus species DETECTED (A) NOT DETECTED Final    Comment: Not Enterococcus species, Streptococcus agalactiae, Streptococcus pyogenes, or Streptococcus pneumoniae. CRITICAL RESULT CALLED TO, READ BACK BY AND VERIFIED WITH: Bronwen Betters PHARMD 1741 03/17/19 A BROWNING    Streptococcus agalactiae NOT DETECTED NOT DETECTED Final   Streptococcus pneumoniae NOT DETECTED NOT DETECTED Final   Streptococcus pyogenes NOT DETECTED NOT DETECTED Final   Acinetobacter baumannii NOT DETECTED NOT DETECTED Final   Enterobacteriaceae species NOT DETECTED NOT DETECTED Final   Enterobacter cloacae complex NOT DETECTED NOT DETECTED Final   Escherichia coli NOT DETECTED NOT DETECTED Final   Klebsiella oxytoca NOT DETECTED NOT DETECTED Final   Klebsiella pneumoniae NOT DETECTED NOT DETECTED Final   Proteus species NOT DETECTED NOT DETECTED Final   Serratia marcescens NOT DETECTED NOT DETECTED Final    Haemophilus influenzae NOT DETECTED NOT DETECTED Final   Neisseria meningitidis NOT DETECTED NOT DETECTED Final   Pseudomonas aeruginosa NOT DETECTED NOT DETECTED Final   Candida albicans NOT DETECTED NOT DETECTED Final   Candida glabrata NOT DETECTED NOT DETECTED Final   Candida krusei NOT DETECTED NOT DETECTED Final   Candida parapsilosis NOT DETECTED NOT DETECTED Final   Candida tropicalis NOT DETECTED NOT DETECTED Final    Comment: Performed at [facility], [place]. [address], [place], [place] [address]  SARS CORONAVIRUS 2 (TAT 6-24 HRS) Nasopharyngeal Nasopharyngeal Swab     Status: None   Collection Time: 03/17/19 12:32 AM   Specimen: Nasopharyngeal Swab  Result Value Ref Range Status   SARS Coronavirus 2 NEGATIVE NEGATIVE Final    Comment: (NOTE) SARS-CoV-2 target nucleic acids are NOT DETECTED. The SARS-CoV-2 RNA is generally detectable in upper and lower respiratory specimens during the acute phase of infection. Negative results do not preclude SARS-CoV-2 infection, do not rule out co-infections with other pathogens, and should not be used as the sole basis for treatment or other patient management decisions. Negative results must be combined with clinical observations, patient history, and epidemiological information. The expected result is Negative. Fact Sheet for Patients: SugarRoll.be Fact Sheet for Healthcare Providers: https://www.woods-mathews.com/ This test is not yet approved or cleared by the [place] FDA and  has been authorized for detection and/or diagnosis of SARS-CoV-2 by FDA under an Emergency Use Authorization (EUA). This EUA will remain  in effect (meaning this test can be used) for the duration of the COVID-19 declaration under Section 56 4(b)(1) of the Act, 21 U.S.C. section 360bbb-3(b)(1), unless the authorization is terminated or revoked sooner. Performed at [facility], [place] [address],  [place], [place] [address]   Urine culture     Status: Abnormal   Collection Time: 03/17/19 12:54 AM  Specimen: Urine, Random  Result Value Ref Range Status   Specimen Description URINE, RANDOM  Final   Special Requests   Final    NONE Performed at [facility], [address] [place], [place] [address]    Culture MULTIPLE SPECIES PRESENT, SUGGEST RECOLLECTION (A)  Final   Report Status 03/18/2019 FINAL  Final    Janine Ores, MD [facility] [email or web address] 03/18/2019, 4:50 PM

## 2019-03-18 NOTE — Plan of Care (Signed)
Continue to monitor

## 2019-03-19 ENCOUNTER — Encounter: Payer: Medicare Other | Admitting: Pharmacist

## 2019-03-19 ENCOUNTER — Inpatient Hospital Stay (HOSPITAL_COMMUNITY): Payer: Medicare Other

## 2019-03-19 DIAGNOSIS — I351 Nonrheumatic aortic (valve) insufficiency: Secondary | ICD-10-CM | POA: Diagnosis not present

## 2019-03-19 DIAGNOSIS — R6520 Severe sepsis without septic shock: Secondary | ICD-10-CM

## 2019-03-19 DIAGNOSIS — A408 Other streptococcal sepsis: Principal | ICD-10-CM

## 2019-03-19 DIAGNOSIS — I5043 Acute on chronic combined systolic (congestive) and diastolic (congestive) heart failure: Secondary | ICD-10-CM

## 2019-03-19 DIAGNOSIS — R7881 Bacteremia: Secondary | ICD-10-CM | POA: Diagnosis present

## 2019-03-19 DIAGNOSIS — I361 Nonrheumatic tricuspid (valve) insufficiency: Secondary | ICD-10-CM

## 2019-03-19 DIAGNOSIS — J9601 Acute respiratory failure with hypoxia: Secondary | ICD-10-CM

## 2019-03-19 DIAGNOSIS — B954 Other streptococcus as the cause of diseases classified elsewhere: Secondary | ICD-10-CM | POA: Diagnosis present

## 2019-03-19 DIAGNOSIS — N179 Acute kidney failure, unspecified: Secondary | ICD-10-CM

## 2019-03-19 DIAGNOSIS — K625 Hemorrhage of anus and rectum: Secondary | ICD-10-CM

## 2019-03-19 LAB — BASIC METABOLIC PANEL
Anion gap: 13 (ref 5–15)
Anion gap: 16 — ABNORMAL HIGH (ref 5–15)
BUN: 92 mg/dL — ABNORMAL HIGH (ref 8–23)
BUN: 91 mg/dL — ABNORMAL HIGH (ref 8–23)
CO2: 23 mmol/L (ref 22–32)
CO2: 21 mmol/L — ABNORMAL LOW (ref 22–32)
Calcium: 8.7 mg/dL — ABNORMAL LOW (ref 8.9–10.3)
Calcium: 8.7 mg/dL — ABNORMAL LOW (ref 8.9–10.3)
Chloride: 92 mmol/L — ABNORMAL LOW (ref 98–111)
Chloride: 92 mmol/L — ABNORMAL LOW (ref 98–111)
Creatinine, Ser: 3.65 mg/dL — ABNORMAL HIGH (ref 0.61–1.24)
Creatinine, Ser: 3.51 mg/dL — ABNORMAL HIGH (ref 0.61–1.24)
GFR calc Af Amer: 18 mL/min — ABNORMAL LOW (ref >60)
GFR calc Af Amer: 19 mL/min — ABNORMAL LOW (ref >60)
GFR calc non Af Amer: 16 mL/min — ABNORMAL LOW (ref >60)
GFR calc non Af Amer: 17 mL/min — ABNORMAL LOW (ref >60)
Glucose, Bld: 94 mg/dL (ref 70–99)
Glucose, Bld: 107 mg/dL — ABNORMAL HIGH (ref 70–99)
Potassium: 5.5 mmol/L — ABNORMAL HIGH (ref 3.5–5.1)
Potassium: 5.2 mmol/L — ABNORMAL HIGH (ref 3.5–5.1)
Sodium: 128 mmol/L — ABNORMAL LOW (ref 135–145)
Sodium: 129 mmol/L — ABNORMAL LOW (ref 135–145)

## 2019-03-19 LAB — PROTIME-INR
INR: 2.7 — ABNORMAL HIGH (ref 0.8–1.2)
Prothrombin Time: 28.5 seconds — ABNORMAL HIGH (ref 11.4–15.2)

## 2019-03-19 LAB — CBC
HCT: 23.3 % — ABNORMAL LOW (ref 39.0–52.0)
Hemoglobin: 7.5 g/dL — ABNORMAL LOW (ref 13.0–17.0)
MCH: 31.6 pg (ref 26.0–34.0)
MCHC: 32.2 g/dL (ref 30.0–36.0)
MCV: 98.3 fL (ref 80.0–100.0)
Platelets: 202 10*3/uL (ref 150–400)
RBC: 2.37 MIL/uL — ABNORMAL LOW (ref 4.22–5.81)
RDW: 17 % — ABNORMAL HIGH (ref 11.5–15.5)
WBC: 24.6 10*3/uL — ABNORMAL HIGH (ref 4.0–10.5)
nRBC: 0.1 % (ref 0.0–0.2)

## 2019-03-19 LAB — CULTURE, BLOOD (ROUTINE X 2): Special Requests: ADEQUATE

## 2019-03-19 LAB — POTASSIUM
Potassium: 5.6 mmol/L — ABNORMAL HIGH (ref 3.5–5.1)
Potassium: 5.5 mmol/L — ABNORMAL HIGH (ref 3.5–5.1)

## 2019-03-19 LAB — ECHOCARDIOGRAM COMPLETE
Height: 71 in
Weight: 5531.2 oz

## 2019-03-19 LAB — OSMOLALITY, URINE: Osmolality, Ur: 348 mOsm/kg (ref 300–900)

## 2019-03-19 LAB — HEMOGLOBIN AND HEMATOCRIT, BLOOD
HCT: 26.8 % — ABNORMAL LOW (ref 39.0–52.0)
Hemoglobin: 8.7 g/dL — ABNORMAL LOW (ref 13.0–17.0)

## 2019-03-19 LAB — PREPARE RBC (CROSSMATCH)

## 2019-03-19 MED ORDER — HEPARIN (PORCINE) 25000 UT/250ML-% IV SOLN: 1100.0000 [IU]/h | INTRAVENOUS | Status: DC

## 2019-03-19 MED ORDER — PERFLUTREN LIPID MICROSPHERE
INTRAVENOUS | Status: AC
  Filled 2019-03-19: qty 10

## 2019-03-19 MED ORDER — WARFARIN SODIUM 10 MG PO TABS
10.0000 mg | ORAL_TABLET | Freq: Once | ORAL | Status: DC
  Filled 2019-03-19: qty 1

## 2019-03-19 MED ORDER — HEPARIN (PORCINE) 25000 UT/250ML-% IV SOLN
1900.0000 [IU]/h | INTRAVENOUS | Status: DC
  Administered 2019-03-19: 1100 [IU]/h via INTRAVENOUS
  Administered 2019-03-20: 1700 [IU]/h via INTRAVENOUS
  Administered 2019-03-21 – 2019-03-23 (×4): 1900 [IU]/h via INTRAVENOUS
  Filled 2019-03-19 (×6): qty 250

## 2019-03-19 MED ORDER — PERFLUTREN LIPID MICROSPHERE
1.0000 mL | INTRAVENOUS | Status: AC | PRN
  Administered 2019-03-19: 4 mL via INTRAVENOUS
  Filled 2019-03-19: qty 10

## 2019-03-19 MED ORDER — SODIUM CHLORIDE 0.9% IV SOLUTION: Freq: Once | INTRAVENOUS | Status: DC

## 2019-03-19 MED ORDER — SODIUM ZIRCONIUM CYCLOSILICATE 10 G PO PACK
10.0000 g | PACK | Freq: Once | ORAL | Status: AC
  Administered 2019-03-19: 10 g via ORAL
  Filled 2019-03-19: qty 1

## 2019-03-19 MED ORDER — SODIUM CHLORIDE 0.9 % IV SOLN: INTRAVENOUS | Status: DC

## 2019-03-19 NOTE — Progress Notes (Addendum)
[facility]  PROGRESS NOTE  Frank [name] DOB: 1946/02/21 DOA: 03/16/2019 PCP: [name], MD Admit date - 03/16/2019   Admitting Physician [name], MD  Outpatient Primary MD for the patient is [name], MD  LOS - 2 Brief Narrative   Frank [name] is a 73 y.o. 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  In the ED found to be febrile to 103, tachypneic, hypoxic to 86% on room air requiring 2 L, WBC 29, lactic acid 2.8, creatinine 2.5, fecal occult positive, Covid test negative.  UA remarkable for large leukocytes, greater than 50 WBCs, rare bacteria Chest x-ray showed cardiomegaly with mild volume overload. 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  TTE negative, currently awaiting TEE, continuing ceftriaxone as recommended by ID  Subjective  Frank [name] today has no worsening shortness of breath, mild cough.  Denies chest pain, no abdominal pain, still urinating well.  A & P    Group G strep bacteremia.  Likely source right lower leg cellulitis.  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  TTE without valvular involvement  -Needs TEE, will likely need INR to be less than 2, transition to heparin, cardiology  consulted, n.p.o. at midnight  -Repeat blood cultures to document clearance   -Continue IV ceftriaxone 2 g  Sepsis, likely related to group G strep bacteremia.  Initially admitted for  possible UTI given large leuks but asymptomatic and no bacteria.  Currently afebrile,White count downtrending  -Continue IV ceftriaxone as mentioned above  Nonoliguric AKI on CKD stage III with hyperkalemia.  Likely related to hypotension related to sepsis/bacteremia.  Peak creatinine of 3.65, slowly downtrending now, baseline 1.2-1.6.  UA unremarkable still making good urine, potassium 5.5  -Monitor urine output, follow BMP, avoid nephrotoxins  -If creatinine worsens or urine output decreases will obtain renal ultrasound formally consult  nephrology, anticipate improvement with IV fluids  -Timed IV fluids, carefully in setting of CHF  -Hold home losartan  -Lokelma x1, monitor K         Acute hypoxic respiratory failure.  Likely resultant of acute exacerbation of CHF in the                  setting of infection/bacteremia.  Improving as patient is currently on 2 L down from 2 L 24 hours          ago.  Did not tolerate IV Lasix as blood pressure dropped  -We will hold off on further diuresis given stability of O2 and closely monitor weights, output  -Wean O2 as able  Hypotension, improving.  Could be related to sepsis/bacteremia.  -Continue low-dose maintenance fluids, monitor  Combined systolic/diastolic CHF.  Stable.  CXR with mild volume overload, currently on 2 L O2, no crackles appreciated, has significant right lower leg swelling which is chronic.  Unable to assess JVD  -Monitor ins and outs, daily weights,   -Holding off on home metolazone/torsemide/spironolactone, not tolerating due to hypotension        Mechanical mitral valve/aortic valve.  Supratherapeutic on admission  requiring vitamin K,             currently 2.7 will need to hold warfarin prior to TEE  -Hold warfarin, daily INR goal 2.5-3.5)  -Start heparin per pharmacy protocol        Anemia secondary to iron deficiency.  Goal hemoglobin greater than 8 given significant  cardiac history  -Transfuse 1 unit given hemoglobin  currently 7.5  -Hold off on further iron supplementation in setting of concurrent infection         Paroxysmal atrial fibrillation, complete heart block status post pacemaker.  Currently                rate controlled in NSR.  -Followed by Dr. [name] as outpatient continue to closely monitor        Chronic right lower leg edema.  Venous duplex negative for DVT  -Continue to monitor        Rectal bleeding.  Likely due to known diagnosis of hemorrhoids in the setting of supratherapeutic        INR.  Currently stable, hemoglobin has slowly down trended.  -Will receive transfusion as mentioned above  -Has not had any further episodes during hospital stay, continue to monitor      Family Communication  : Wife updated at bedside  Code Status : Full code  Disposition Plan  : Continue IV ceftriaxone, needs TEE, close monitoring of blood pressure, creatinine/kidney function  Consults  : Cardiology, ID  Procedures  : TTE (12/9)  DVT Prophylaxis  : Heparin Lab Results  Component Value Date   PLT 202 03/19/2019    Diet :  Diet Order            Diet NPO time specified  Diet effective midnight        Diet Heart Room service appropriate? Yes; Fluid consistency: Thin  Diet effective now               Inpatient Medications Scheduled Meds: . sodium chloride   Intravenous Once  . ferrous sulfate  325 mg Oral Daily  . perflutren lipid microspheres (DEFINITY) IV suspension      . psyllium  1 packet Oral Daily  . warfarin  10 mg Oral ONCE-1800  . Warfarin - Pharmacist Dosing Inpatient   Does not apply q1800   Continuous Infusions: . sodium chloride    . cefTRIAXone (ROCEPHIN)  IV Stopped (03/18/19 2153)   PRN Meds:.acetaminophen, witch hazel-glycerin  Antibiotics  :   Anti-infectives (From admission, onward)   Start     Dose/Rate Route Frequency Ordered Stop   03/17/19 2200  cefTRIAXone (ROCEPHIN) 1 g in sodium chloride 0.9 % 100 mL IVPB  Status:  Discontinued     1 g 200  mL/hr over 30 Minutes Intravenous Every 24 hours 03/17/19 0240 03/17/19 1536   03/17/19 2200  cefTRIAXone (ROCEPHIN) 2 g in sodium chloride 0.9 % 100 mL IVPB     2 g 200 mL/hr over 30 Minutes Intravenous Every 24 hours 03/17/19 1536     03/17/19 0130  cefTRIAXone (ROCEPHIN) 1 g in sodium chloride 0.9 % 100 mL IVPB     1 g 200 mL/hr over 30 Minutes Intravenous  Once 03/17/19 0123 03/17/19 0214       Objective   Vitals:   03/19/19 0446 03/19/19 0804 03/19/19 0834 03/19/19 1110  BP: (!) 99/43 (!) 100/43 91/74 110/60  Pulse:  60  60  Resp: 16 18 15 19   Temp: 97.7 F (  36.5 C) 98.5 F (36.9 C)  98.8 F (37.1 C)  TempSrc: Oral Oral  Oral  SpO2: 100% 98% 92% 94%  Weight:      Height:        SpO2: 94 % O2 Flow Rate (L/min): 3 L/min  Wt Readings from Last 3 Encounters:  03/19/19 (!) 156.8 kg  02/28/19 (!) 150.1 kg  02/14/19 (!) 156 kg     Intake/Output Summary (Last 24 hours) at 03/19/2019 1554 Last data filed at 03/19/2019 1115 Gross per 24 hour  Intake 1507.78 ml  Output 925 ml  Net 582.78 ml    Physical Exam:  Awake Alert, Oriented X 3, Normal affect No new F.N deficits,  [place].AT, No JVD appreciated Symmetrical Chest wall movement, Good air movement bilaterally, CTAB on 2 L [place] RRR,systolic click and murmur heard +ve B.Sounds, Abd Soft, No tenderness, No organomegaly appreciated, No rebound, guarding or rigidity. Right leg 1+ edema to above thigh, circumferential redness and warmth without tenderness on calf    I have personally reviewed the following:   Data Reviewed:  CBC Recent Labs  Lab 03/12/19 1634 03/16/19 2359 03/17/19 0743 03/18/19 0310 03/19/19 0220  WBC 7.2 29.8* 42.8* 36.1* 24.6*  HGB 8.9 Repeated and verified X2.* 8.9* 8.3* 7.9* 7.5*  HCT 26.9 Repeated and verified X2.* 28.7* 27.0* 24.3* 23.3*  PLT 242.0 270 224 195 202  MCV 95.6 101.8* 103.1* 98.8 98.3  MCH  --  31.6 31.7 32.1 31.6  MCHC 33.1 31.0 30.7 32.5 32.2  RDW 17.6* 17.0* 17.1*  17.0* 17.0*  LYMPHSABS  --  0.7  --   --   --   MONOABS  --  2.0*  --   --   --   EOSABS  --  0.0  --   --   --   BASOSABS  --  0.1  --   --   --     Chemistries  Recent Labs  Lab 03/16/19 2359 03/17/19 0743 03/18/19 0310 03/19/19 0220 03/19/19 0815 03/19/19 1141  NA 135 135 129* 128* 129*  --   K 4.3 4.6 5.1 5.5* 5.2* 5.6*  CL 98 97* 92* 92* 92*  --   CO2 24 18* 25 23 21*  --   GLUCOSE 100* 90 90 94 107*  --   BUN 54* 61* 79* 92* 91*  --   CREATININE 2.54* 3.05* 3.53* 3.65* 3.51*  --   CALCIUM 9.5 9.1 8.8* 8.7* 8.7*  --    ------------------------------------------------------------------------------------------------------------------ No results for input(s): CHOL, HDL, LDLCALC, TRIG, CHOLHDL, LDLDIRECT in the last 72 hours.  Lab Results  Component Value Date   HGBA1C 5.7 (H) 09/18/2017   ------------------------------------------------------------------------------------------------------------------ No results for input(s): TSH, T4TOTAL, T3FREE, THYROIDAB in the last 72 hours.  Invalid input(s): FREET3 ------------------------------------------------------------------------------------------------------------------ No results for input(s): VITAMINB12, FOLATE, FERRITIN, TIBC, IRON, RETICCTPCT in the last 72 hours.  Coagulation profile Recent Labs  Lab 03/16/19 2359 03/17/19 0743 03/18/19 0310 03/19/19 0220  INR 4.7* 5.2* 6.3* 2.7*    No results for input(s): DDIMER in the last 72 hours.  Cardiac Enzymes No results for input(s): CKMB, TROPONINI, MYOGLOBIN in the last 168 hours.  Invalid input(s): CK ------------------------------------------------------------------------------------------------------------------    Component Value Date/Time   BNP 105.7 (H) 03/16/2019 2359   BNP 31.3 07/28/2015 0829    Micro Results Recent Results (from the past 240 hour(s))  Blood culture (routine x 2)     Status: Abnormal   Collection Time: 03/16/19 11:59 PM  Specimen: BLOOD  Result Value Ref Range Status   Specimen Description BLOOD RIGHT HAND  Final   Special Requests   Final    BOTTLES DRAWN AEROBIC AND ANAEROBIC Blood Culture results may not be optimal due to an inadequate volume of blood received in culture bottles   Culture  Setup Time   Final    GRAM POSITIVE COCCI IN CHAINS AEROBIC BOTTLE ONLY CRITICAL RESULT CALLED TO, READ BACK BY AND VERIFIED WITH: E. SINCLAIR, PHARMD AT 1530 ON 03/17/19 BY C. JESSUP, MT.    Culture (A)  Final    STREPTOCOCCUS GROUP G SUSCEPTIBILITIES PERFORMED ON PREVIOUS CULTURE WITHIN THE LAST 5 DAYS. Performed at [facility], [place] [address], [place], [place] [address]    Report Status 03/19/2019 FINAL  Final  Blood culture (routine x 2)     Status: Abnormal   Collection Time: 03/16/19 11:59 PM   Specimen: BLOOD  Result Value Ref Range Status   Specimen Description BLOOD LEFT HAND  Final   Special Requests   Final    BOTTLES DRAWN AEROBIC AND ANAEROBIC Blood Culture adequate volume   Culture  Setup Time   Final    GRAM POSITIVE COCCI IN CHAINS IN BOTH AEROBIC AND ANAEROBIC BOTTLES CRITICAL RESULT CALLED TO, READ BACK BY AND VERIFIED WITHBronwen Betters [facility] 0865 03/17/19 A BROWNING Performed at [facility], [place] [address], [place], [place] [address]    Culture STREPTOCOCCUS GROUP G (A)  Final   Report Status 03/19/2019 FINAL  Final   Organism ID, Bacteria STREPTOCOCCUS GROUP G  Final      Susceptibility   Streptococcus group g - MIC*    CLINDAMYCIN <=0.25 SENSITIVE Sensitive     AMPICILLIN <=0.25 SENSITIVE Sensitive     ERYTHROMYCIN <=0.12 SENSITIVE Sensitive     VANCOMYCIN 0.5 SENSITIVE Sensitive     CEFTRIAXONE <=0.12 SENSITIVE Sensitive     LEVOFLOXACIN 0.5 SENSITIVE Sensitive     PENICILLIN Value in next row Sensitive      SENSITIVE<=0.06    * STREPTOCOCCUS GROUP G  Blood Culture ID Panel (Reflexed)     Status: Abnormal   Collection Time: 03/16/19 11:59 PM  Result Value Ref Range  Status   Enterococcus species NOT DETECTED NOT DETECTED Final   Listeria monocytogenes NOT DETECTED NOT DETECTED Final   Staphylococcus species NOT DETECTED NOT DETECTED Final   Staphylococcus aureus (BCID) NOT DETECTED NOT DETECTED Final   Streptococcus species DETECTED (A) NOT DETECTED Final    Comment: Not Enterococcus species, Streptococcus agalactiae, Streptococcus pyogenes, or Streptococcus pneumoniae. CRITICAL RESULT CALLED TO, READ BACK BY AND VERIFIED WITH: Bronwen Betters PHARMD 1741 03/17/19 A BROWNING    Streptococcus agalactiae NOT DETECTED NOT DETECTED Final   Streptococcus pneumoniae NOT DETECTED NOT DETECTED Final   Streptococcus pyogenes NOT DETECTED NOT DETECTED Final   Acinetobacter baumannii NOT DETECTED NOT DETECTED Final   Enterobacteriaceae species NOT DETECTED NOT DETECTED Final   Enterobacter cloacae complex NOT DETECTED NOT DETECTED Final   Escherichia coli NOT DETECTED NOT DETECTED Final   Klebsiella oxytoca NOT DETECTED NOT DETECTED Final   Klebsiella pneumoniae NOT DETECTED NOT DETECTED Final   Proteus species NOT DETECTED NOT DETECTED Final   Serratia marcescens NOT DETECTED NOT DETECTED Final   Haemophilus influenzae NOT DETECTED NOT DETECTED Final   Neisseria meningitidis NOT DETECTED NOT DETECTED Final   Pseudomonas aeruginosa NOT DETECTED NOT DETECTED Final   Candida albicans NOT DETECTED NOT DETECTED Final   Candida  glabrata NOT DETECTED NOT DETECTED Final   Candida krusei NOT DETECTED NOT DETECTED Final   Candida parapsilosis NOT DETECTED NOT DETECTED Final   Candida tropicalis NOT DETECTED NOT DETECTED Final    Comment: Performed at [facility], [place] [address] [place], [place] [address]  SARS CORONAVIRUS 2 (TAT 6-24 HRS) Nasopharyngeal Nasopharyngeal Swab     Status: None   Collection Time: 03/17/19 12:32 AM   Specimen: Nasopharyngeal Swab  Result Value Ref Range Status   SARS Coronavirus 2 NEGATIVE NEGATIVE Final    Comment: (NOTE) SARS-CoV-2  target nucleic acids are NOT DETECTED. The SARS-CoV-2 RNA is generally detectable in upper and lower respiratory specimens during the acute phase of infection. Negative results do not preclude SARS-CoV-2 infection, do not rule out co-infections with other pathogens, and should not be used as the sole basis for treatment or other patient management decisions. Negative results must be combined with clinical observations, patient history, and epidemiological information. The expected result is Negative. Fact Sheet for Patients: SugarRoll.be Fact Sheet for Healthcare Providers: https://www.woods-mathews.com/ This test is not yet approved or cleared by the [place] FDA and  has been authorized for detection and/or diagnosis of SARS-CoV-2 by FDA under an Emergency Use Authorization (EUA). This EUA will remain  in effect (meaning this test can be used) for the duration of the COVID-19 declaration under Section 56 4(b)(1) of the Act, 21 U.S.C. section 360bbb-3(b)(1), unless the authorization is terminated or revoked sooner. Performed at [facility], [place] [address] [place], [place] [address]   Urine culture     Status: Abnormal   Collection Time: 03/17/19 12:54 AM   Specimen: Urine, Random  Result Value Ref Range Status   Specimen Description URINE, RANDOM  Final   Special Requests   Final    NONE Performed at [facility], [place] [address], [place], [place] [address]    Culture MULTIPLE SPECIES PRESENT, SUGGEST RECOLLECTION (A)  Final   Report Status 03/18/2019 FINAL  Final    Radiology Reports Dg Chest Port 1 View  Result Date: 03/16/2019 CLINICAL DATA:  Shortness of breath EXAM: PORTABLE CHEST 1 VIEW COMPARISON:  July 10, 2018 FINDINGS: The heart size is significantly enlarged. The patient is status post prior median sternotomy. Aortic calcifications are noted. There is a multi lead right-sided pacemaker in place. There is  vascular congestion without overt pulmonary edema. There is no pneumothorax or large pleural effusion. There is no acute osseous abnormality. IMPRESSION: Cardiomegaly with mild volume overload. Electronically Signed   By: Constance Holster M.D.   On: 03/16/2019 23:50   Vas [name] Lower Extremity Venous (dvt)  Result Date: 03/17/2019  Lower Venous Study Indications: Swelling, and Edema.  Limitations: Body habitus and poor ultrasound/tissue interface. Comparison Study: no prior Performing Technologist: Abram Sander RVS  Examination Guidelines: A complete evaluation includes B-mode imaging, spectral Doppler, color Doppler, and power Doppler as needed of all accessible portions of each vessel. Bilateral testing is considered an integral part of a complete examination. Limited examinations for reoccurring indications may be performed as noted.  +---------+---------------+---------+-----------+----------+--------------+ RIGHT    CompressibilityPhasicitySpontaneityPropertiesThrombus Aging +---------+---------------+---------+-----------+----------+--------------+ CFV      Full           Yes      Yes                                 +---------+---------------+---------+-----------+----------+--------------+ SFJ  Full                                                        +---------+---------------+---------+-----------+----------+--------------+ FV Prox  Full                                                        +---------+---------------+---------+-----------+----------+--------------+ FV Mid                  Yes      Yes                                 +---------+---------------+---------+-----------+----------+--------------+ FV Distal               Yes      Yes                                 +---------+---------------+---------+-----------+----------+--------------+ PFV      Full                                                         +---------+---------------+---------+-----------+----------+--------------+ POP      Full           Yes      Yes                                 +---------+---------------+---------+-----------+----------+--------------+ PTV                                                   Not visualized +---------+---------------+---------+-----------+----------+--------------+ PERO                                                  Not visualized +---------+---------------+---------+-----------+----------+--------------+   +----+---------------+---------+-----------+----------+--------------+ LEFTCompressibilityPhasicitySpontaneityPropertiesThrombus Aging +----+---------------+---------+-----------+----------+--------------+ CFV                Yes      Yes                                 +----+---------------+---------+-----------+----------+--------------+     Summary: Right: There is no evidence of deep vein thrombosis in the lower extremity. However, portions of this examination were limited- see technologist comments above. No cystic structure found in the popliteal fossa. Left: No evidence of common femoral vein obstruction.  *See table(s) above for measurements and observations. Electronically signed by Servando Snare MD on 03/17/2019 at 5:03:22 PM.    Final      Time Spent in minutes  Draper Jaidynn Balster M.D on 03/19/2019 at 3:54 PM  To page go to [email or web address] - password [email or web address]

## 2019-03-19 NOTE — Progress Notes (Signed)
    [facility] has been requested to perform a transesophageal echocardiogram on 03/21/19 for Bacteremia with Dr. Marlou Porch.  After careful review of history and examination, the risks and benefits of transesophageal echocardiogram have been explained including risks of esophageal damage, perforation (1:10,000 risk), bleeding, pharyngeal hematoma as well as other potential complications associated with conscious sedation including aspiration, arrhythmia, respiratory failure and death. Alternatives to treatment were discussed, questions were answered. Patient is willing to proceed. In review of labs his K+ has been elevated, 5.6 today, and Hgb has been low 7.5. Receiving a unit of PRBCs now. Will need to follow up on labs again tomorrow prior to procedure.   Reino Bellis, NP-C 03/19/2019 4:45 PM

## 2019-03-19 NOTE — Progress Notes (Signed)
  Echocardiogram 2D Echocardiogram has been performed.  Frank [name] 03/19/2019, 10:47 AM

## 2019-03-19 NOTE — Progress Notes (Signed)
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 Found with sepsis from UTI and possible cellulitis of R LE, CHF.    PT Comments    Pt limited by LE muscular fatigue and RLE pain during session. Pt fatigues quickly without SOB or DOE on room air, reporting his legs feel weak and tired. Pt ambulates for short distances limited by fatigue and requires frequent rest breaks during exercise. Pt is able to perform 5x STS during session with supervision and increased time. Pt will continue to benefit from acute PT services to improve LE muscular endurance and overall activity tolerance. If pt does not progress during next PT session may need to consider need for inpatient PT services upon discharge.   Follow Up Recommendations  Home health PT;Supervision - Intermittent     Equipment Recommendations  Rolling walker with 5" wheels    Recommendations for Other Services       Precautions / Restrictions Precautions Precautions: Fall Precaution Comments: watch BP Restrictions Weight Bearing Restrictions: No    Mobility  Bed Mobility Overal bed mobility: (pt received and left sitting edge of bed)                Transfers Overall transfer level: Needs assistance Equipment used: None Transfers: Sit to/from Stand Sit to Stand: Supervision         General transfer comment: pt standing from elevated surface, performing 5 time sit to stand at end of session  Ambulation/Gait Ambulation/Gait assistance: Min guard Gait Distance (Feet): 10 Feet Assistive device: Rolling walker (2  wheeled) Gait Pattern/deviations: Step-to pattern;Wide base of support Gait velocity: reduced Gait velocity interpretation: <1.31 ft/sec, indicative of household ambulator General Gait Details: pt with widened BOS, increased lateral sway and increased trunk flexion   Stairs             Wheelchair Mobility    Modified Rankin (Stroke Patients Only)       Balance Overall balance assessment: Needs assistance Sitting-balance support: No upper extremity supported;Feet supported Sitting balance-Leahy Scale: Good Sitting balance - Comments: supervision   Standing balance support: Single extremity supported Standing balance-Leahy Scale: Good Standing balance comment: supervision for static standing                            Cognition Arousal/Alertness: Awake/alert Behavior During Therapy: WFL for tasks assessed/performed Overall Cognitive Status: Within Functional Limits for tasks assessed                                        Exercises General Exercises - Lower Extremity Ankle Circles/Pumps: AROM;Both;20 reps Gluteal Sets: AROM;Both;10 reps Long Arc Quad: AROM;Both;10 reps(AAROM RLE) Hip Flexion/Marching: AROM;AAROM;Both;10 reps(AAROM RLE)    General Comments General comments (skin integrity, edema, etc.): Pt on RA during activity, SpO2 fluctuating from 88%-100% with activity due to inaccurate pleth reading, pt asymptomatic      Pertinent Vitals/Pain Pain Assessment: Faces Faces Pain Scale: Hurts even more Pain Location: RLE when touched Pain Descriptors / Indicators: Aching Pain  Intervention(s): Limited activity within patient's tolerance    Home Living                      Prior Function            PT Goals (current goals can now be found in the care plan section) Acute Rehab PT Goals Patient Stated Goal: to get back home  Progress towards PT goals: Progressing toward goals(slowly)    Frequency    Min  3X/week      PT Plan Current plan remains appropriate    Co-evaluation              AM-PAC PT "6 Clicks" Mobility   Outcome Measure  Help needed turning from your back to your side while in a flat bed without using bedrails?: A Little Help needed moving from lying on your back to sitting on the side of a flat bed without using bedrails?: A Little Help needed moving to and from a bed to a chair (including a wheelchair)?: A Little Help needed standing up from a chair using your arms (e.g., wheelchair or bedside chair)?: None Help needed to walk in hospital room?: A Little Help needed climbing 3-5 steps with a railing? : A Lot 6 Click Score: 18    End of Session Equipment Utilized During Treatment: (none) Activity Tolerance: Patient limited by pain;Patient limited by fatigue Patient left: in bed;with call bell/phone within reach;with family/visitor present Nurse Communication: Mobility status PT Visit Diagnosis: Muscle weakness (generalized) (M62.81);Difficulty in walking, not elsewhere classified (R26.2);Pain Pain - Right/Left: Right Pain - part of body: Leg     Time: 9735-3299 PT Time Calculation (min) (ACUTE ONLY): 28 min  Charges:  $Therapeutic Exercise: 8-22 mins $Therapeutic Activity: 8-22 mins                     Zenaida Niece, PT, DPT Acute Rehabilitation Pager: [phone]    Zenaida Niece 03/19/2019, 3:52 PM

## 2019-03-19 NOTE — Progress Notes (Signed)
ANTICOAGULATION CONSULT NOTE - Follow Up Consult  Pharmacy Consult for warfarin Indication: mech MVR (1999),  Afib (CHADS2VASc =6)   Allergies  Allergen Reactions  . Warfarin And Related Other (See Comments)    COUMADIN or JANTOVEN BRAND ONLY** Per pt, was switched to generic in 2001 and had significantly decreased absorption > INR subtherapeutic> pt suffered a TIA and has been on brand name only since      Patient Measurements: Height: 5\' 11"  (180.3 cm) Weight: (!) 345 lb 11.2 oz (156.8 kg) IBW/kg (Calculated) : 75.3  Vital Signs: Temp: 98.8 F (37.1 C) (12/09 1110) Temp Source: Oral (12/09 1110) BP: 110/60 (12/09 1110) Pulse Rate: 60 (12/09 1110)  Labs: Recent Labs    03/17/19 0743 03/18/19 0310 03/19/19 0220 03/19/19 0815  HGB 8.3* 7.9* 7.5*  --   HCT 27.0* 24.3* 23.3*  --   PLT 224 195 202  --   LABPROT 48.2* 55.9* 28.5*  --   INR 5.2* 6.3* 2.7*  --   CREATININE 3.05* 3.53* 3.65* 3.51*    Estimated Creatinine Clearance: 29.9 mL/min (A) (by C-G formula based on SCr of 3.51 mg/dL (H)).  Assessment: 73 yo M on warfarin for mechMVR 1999 and Afib (CHADS2VASc =6) admitted with melena and INR of 5.2> inc 6.3 despite holding warfarin doses - vit k 2mg  po x1 12/8 >  INR 2.7  - will restart PTA BRAND COUMADIN If INR < 2.5 may want to consider heparin drip with Mechanical MVR Complains of hemorrhoidal bleeding, HGB fell slightly 8.9>7.5 pltc ok - watch  Of note, patient takes COUMADIN or JANTOVEN BRAND ONLY** Per pt, was switched to generic in 2001 and had significantly decreased absorption > INR subtherapeutic> pt suffered a TIA and has been on brand name only since then. Wife brought in home supply since we only have generic warfarin.    Home warfarin: 15mg  sun/wed, 10mg  AOD (stable dose since 2019). Per patient, has been drinking vinegar and eating more picked vegetables but otherwise no change in diet.  Goal of Therapy:  INR 2.5- 3.5 Monitor platelets by  anticoagulation protocol: Yes   Plan:  Coumadin 10mg  x1 - Patient home supply   Daily Protime   [name] Pharm.D. CPP, BCPS Clinical Pharmacist [phone] 03/19/2019 3:00 PM     Please check AMION for all [place] phone numbers After 10:00 PM, call [place] [phone]

## 2019-03-19 NOTE — Progress Notes (Addendum)
ANTICOAGULATION CONSULT NOTE - Follow Up Consult  Pharmacy Consult for Warfarin, Heparin Indication: mech MVR (1999),  Afib (CHADS2VASc =6)   Allergies  Allergen Reactions  . Warfarin And Related Other (See Comments)    COUMADIN or JANTOVEN BRAND ONLY** Per pt, was switched to generic in 2001 and had significantly decreased absorption > INR subtherapeutic> pt suffered a TIA and has been on brand name only since      Patient Measurements: Height: 5\' 11"  (180.3 cm) Weight: (!) 345 lb 11.2 oz (156.8 kg) IBW/kg (Calculated) : 75.3  Heparin Dosing Weight: 111.2 kg  Vital Signs: Temp: 98.8 F (37.1 C) (12/09 1110) Temp Source: Oral (12/09 1110) BP: 110/60 (12/09 1110) Pulse Rate: 60 (12/09 1110)  Labs: Recent Labs    03/17/19 0743 03/18/19 0310 03/19/19 0220 03/19/19 0815  HGB 8.3* 7.9* 7.5*  --   HCT 27.0* 24.3* 23.3*  --   PLT 224 195 202  --   LABPROT 48.2* 55.9* 28.5*  --   INR 5.2* 6.3* 2.7*  --   CREATININE 3.05* 3.53* 3.65* 3.51*    Estimated Creatinine Clearance: 29.9 mL/min (A) (by C-G formula based on SCr of 3.51 mg/dL (H)).  Assessment: 73 yo M on warfarin for mechMVR 1999 and Afib (CHADS2VASc =6) admitted with melena and INR of 5.2> inc 6.3 despite holding warfarin doses - vit k 2mg  po x1 12/8 >  INR 2.7  - pharmacy was consulted to dose warfarin, with plans to PTA BRAND COUMADIN  Of note, patient takes COUMADIN or JANTOVEN BRAND ONLY** Per pt, was switched to generic in 2001 and had significantly decreased absorption > INR subtherapeutic> pt suffered a TIA and has been on brand name only since then. Wife brought in home supply since we only have generic warfarin.    Home warfarin: 15 mg Sun/Wed, 10 mg AOD (stable dose since 2019). Per patient, has been drinking vinegar and eating more picked vegetables, but otherwise no change in diet.  Pharmacy is now consulted to transition pt from warfarin to IV heparin in preparation for TEE. INR this AM was 2.7; H/H  7.5/23.3, platelets 202. Per RN, no bleeding observed.  Goal of Therapy:  INR 2.5- 3.5 Heparin level 0.3-0.7 units/ml Monitor platelets by anticoagulation protocol: Yes   Plan:  Hold warfarin Start heparin at 1100 units/hr this evening Check 7-hr heparin level Daily INR, heparin level, CBC Monitor for signs/symptoms of bleeding   Gillermina Hu, PharmD, BCPS, [facility] Clinical Pharmacist 03/19/2019 3:53 PM   Please check AMION for all [place] phone numbers After 10:00 PM, call Mar[facility] [phone]

## 2019-03-20 ENCOUNTER — Inpatient Hospital Stay (HOSPITAL_COMMUNITY): Payer: Medicare Other

## 2019-03-20 DIAGNOSIS — I5042 Chronic combined systolic (congestive) and diastolic (congestive) heart failure: Secondary | ICD-10-CM

## 2019-03-20 DIAGNOSIS — L03119 Cellulitis of unspecified part of limb: Secondary | ICD-10-CM

## 2019-03-20 DIAGNOSIS — I959 Hypotension, unspecified: Secondary | ICD-10-CM | POA: Diagnosis present

## 2019-03-20 LAB — BASIC METABOLIC PANEL
Anion gap: 14 (ref 5–15)
BUN: 90 mg/dL — ABNORMAL HIGH (ref 8–23)
CO2: 23 mmol/L (ref 22–32)
Calcium: 8.7 mg/dL — ABNORMAL LOW (ref 8.9–10.3)
Chloride: 93 mmol/L — ABNORMAL LOW (ref 98–111)
Creatinine, Ser: 2.83 mg/dL — ABNORMAL HIGH (ref 0.61–1.24)
GFR calc Af Amer: 25 mL/min — ABNORMAL LOW (ref >60)
GFR calc non Af Amer: 22 mL/min — ABNORMAL LOW (ref >60)
Glucose, Bld: 87 mg/dL (ref 70–99)
Potassium: 4.8 mmol/L (ref 3.5–5.1)
Sodium: 130 mmol/L — ABNORMAL LOW (ref 135–145)

## 2019-03-20 LAB — BPAM RBC
Blood Product Expiration Date: 202101032359
ISSUE DATE / TIME: 202012091640
Unit Type and Rh: 7300

## 2019-03-20 LAB — CBC
HCT: 26 % — ABNORMAL LOW (ref 39.0–52.0)
Hemoglobin: 8.5 g/dL — ABNORMAL LOW (ref 13.0–17.0)
MCH: 32.1 pg (ref 26.0–34.0)
MCHC: 32.7 g/dL (ref 30.0–36.0)
MCV: 98.1 fL (ref 80.0–100.0)
Platelets: 216 10*3/uL (ref 150–400)
RBC: 2.65 MIL/uL — ABNORMAL LOW (ref 4.22–5.81)
RDW: 17.6 % — ABNORMAL HIGH (ref 11.5–15.5)
WBC: 15.9 10*3/uL — ABNORMAL HIGH (ref 4.0–10.5)
nRBC: 0.4 % — ABNORMAL HIGH (ref 0.0–0.2)

## 2019-03-20 LAB — POTASSIUM
Potassium: 5.1 mmol/L (ref 3.5–5.1)
Potassium: 4.8 mmol/L (ref 3.5–5.1)
Potassium: 5.4 mmol/L — ABNORMAL HIGH (ref 3.5–5.1)

## 2019-03-20 LAB — HEPARIN LEVEL (UNFRACTIONATED)
Heparin Unfractionated: <0.1 IU/mL — ABNORMAL LOW (ref 0.30–0.70)
Heparin Unfractionated: <0.1 IU/mL — ABNORMAL LOW (ref 0.30–0.70)

## 2019-03-20 LAB — TYPE AND SCREEN
ABO/RH(D): B POS
Antibody Screen: NEGATIVE
Unit division: 0

## 2019-03-20 LAB — PROTIME-INR
INR: 2.2 — ABNORMAL HIGH (ref 0.8–1.2)
Prothrombin Time: 24.5 seconds — ABNORMAL HIGH (ref 11.4–15.2)

## 2019-03-20 MED ORDER — FUROSEMIDE 10 MG/ML IJ SOLN
40.0000 mg | Freq: Two times a day (BID) | INTRAMUSCULAR | Status: DC
  Administered 2019-03-20 – 2019-03-24 (×8): 40 mg via INTRAVENOUS
  Filled 2019-03-20 (×9): qty 4

## 2019-03-20 MED ORDER — TORSEMIDE 20 MG PO TABS
60.0000 mg | ORAL_TABLET | Freq: Once | ORAL | Status: AC
  Administered 2019-03-20: 60 mg via ORAL
  Filled 2019-03-20: qty 3

## 2019-03-20 MED ORDER — TRAMADOL HCL 50 MG PO TABS: 50.0000 mg | ORAL_TABLET | Freq: Four times a day (QID) | ORAL | Status: DC | PRN

## 2019-03-20 MED ORDER — PENICILLIN G POTASSIUM 20000000 UNITS IJ SOLR
8.0000 10*6.[IU] | Freq: Two times a day (BID) | INTRAVENOUS | Status: DC
  Administered 2019-03-20 – 2019-03-24 (×8): 8 10*6.[IU] via INTRAVENOUS
  Filled 2019-03-20 (×10): qty 8

## 2019-03-20 MED ORDER — HYDROCODONE-ACETAMINOPHEN 5-325 MG PO TABS
1.0000 | ORAL_TABLET | Freq: Four times a day (QID) | ORAL | Status: DC | PRN
  Administered 2019-03-20 – 2019-03-23 (×9): 2 via ORAL
  Filled 2019-03-20 (×10): qty 2

## 2019-03-20 MED ORDER — ACETAMINOPHEN 325 MG PO TABS: 650.0000 mg | ORAL_TABLET | Freq: Four times a day (QID) | ORAL | Status: DC | PRN

## 2019-03-20 NOTE — H&P (View-Only) (Signed)
[facility]  PROGRESS NOTE  BRAYSON LIVESEY WCB:[id number] DOB: [birth year] DOA: 03/16/2019 PCP: [name], MD Admit date - 03/16/2019   Admitting Physician [name], MD  Outpatient Primary MD for the patient is [name], MD  LOS - 3 Brief Narrative   ION GONNELLA is a 73 y.o. 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  In the ED found to be febrile to 103, tachypneic, hypoxic to 86% on room air requiring 2 L, WBC 29, lactic acid 2.8, creatinine 2.5, fecal occult positive, Covid test negative.  UA remarkable for large leukocytes, greater than 50 WBCs, rare bacteria Chest x-ray showed cardiomegaly with mild volume overload. 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  TTE negative, currently awaiting TEE, continuing ceftriaxone as recommended by ID  Subjective  Mr. Barajas today has no worsening shortness of breath, mild cough.  Denies chest pain, no abdominal pain, still urinating well.  A & P    Group G strep bacteremia and RLE nonpurulent cellulitis.   High concern for potential prosthetic valve endocarditis, remains afebrile, leukocytosis improving, but exquisite tenderness of right ankle/foot.  TTE without valvular involvement  -NPO at midnight TEE, will likely need INR to be less than 2, currently on heparin, ct  -Repeat blood cultures to document clearance negative so far  -disContinue IV ceftriaxone 2 g for penicillin   -Check right ankle x-ray, doubt OM given improvement in other parameters  Sepsis, resolved. Likely related to group G strep bacteremia.  Initially admitted for possible UTI given large leuks but asymptomatic and no bacteria.  Currently afebrile,White count downtrending, BP better.   -Continue IV penicillin as mentioned above  Nonoliguric AKI on CKD stage III with hyperkalemia, improving. Likely related to hypotension related to sepsis/bacteremia.  Peak creatinine of 3.65, now 2.8, baseline 1.2-1.6.  UA unremarkable still making good urine, potassium wnl after lokelma  -Monitor urine output, follow BMP, avoid nephrotoxins  -If creatinine worsens or urine output decreases will obtain renal ultrasound formally consult  nephrology, anticipate improvement with IV fluids  -Timed IV fluids, carefully in setting of CHF  -Hold home losartan          Acute hypoxic respiratory failure, resolved.  Likely resultant of acute exacerbation of CHF in  the setting of infection/bacteremia.  Improving as patient is currently room air down from 2 L  24 hours ago.  Did not tolerate IV Lasix as blood pressure dropped  -We will hold off on further IV diuresis given stability of O2 and slowly resume home  torsemide and closely monitor weights, output    Hypotension, improving.  SBP range in 24 hours 107/64-118/52Could be related to sepsis/bacteremia. Had worsening drop with IV diuretics  -DC IVF given improvement in BP and avoid worsening CHF   Acute on chronicCombined systolic/diastolic CHF.    CXR with mild volume overload, currently on room air,  has significant B/l edema, R>L. Right swelling likely more related to cellulitis but have been holding home diuretics in setting of previous hypotension. Given weights are up 4 lbs and  volume overload on exam will resume oral diuresis  -Trial home torsemide 60 mg x 1 and monitor output and blood pressure, (home regimen 60 mg twice daily)  -Monitor ins and outs, daily weights,    -Holding off on home metolazone/spironolactone, due to previous hypotension        Mechanical mitral valve/aortic valve.  Supratherapeutic on admission requiring vitamin K,             currently 2.2 will need to hold warfarin prior to TEE  -Hold warfarin, daily INR goal 92.5-3.5)  - heparin per pharmacy protocol        Anemia secondary to iron deficiency.  Goal hemoglobin greater than 8 given significant  cardiac history  -Transfuse 1 unit given hemoglobin currently 7.5  -Hold off on further iron supplementation in setting of concurrent infection         Paroxysmal atrial fibrillation, complete heart block status post pacemaker.  Currently                rate controlled in NSR.  -Followed by Dr. [name] as outpatient continue to closely monitor        Chronic right lower leg edema.  Venous duplex negative for DVT  -Continue to monitor        Rectal bleeding.  Likely due to known diagnosis of hemorrhoids in the setting of supratherapeutic        INR.  Currently stable, hemoglobin has slowly down trended.  -Will receive transfusion as mentioned above  -Has not had any further episodes during hospital stay, continue to monitor      Family Communication  : no family at bedside Code Status : Full code  Disposition Plan  : Continue IV [penicillin, needs TEE, close monitoring of blood pressure, creatinine/kidney function  Consults  : Cardiology, ID  Procedures  : TTE (12/9)  DVT Prophylaxis  : Heparin Lab Results  Component Value Date   PLT 216 03/20/2019    Diet :  Diet Order            Diet Heart Room service appropriate? Yes; Fluid consistency: Thin  Diet effective now               Inpatient Medications Scheduled Meds: . sodium chloride   Intravenous Once  . psyllium  1 packet Oral Daily   Continuous Infusions: . heparin 1,450 Units/hr (03/20/19 1221)  . penicillin g continuous IV infusion 8 Million Units (03/20/19 1216)   PRN Meds:.acetaminophen, witch  hazel-glycerin  Antibiotics  :   Anti-infectives (From admission, onward)   Start     Dose/Rate Route Frequency Ordered Stop   03/20/19 1200  penicillin G potassium 8 Million Units in dextrose 5 % 500 mL continuous infusion     8 Million Units 41.7 mL/hr over 12 Hours Intravenous Every 12 hours 03/20/19 0945     03/17/19 2200  cefTRIAXone (ROCEPHIN) 1 g in sodium chloride 0.9 % 100 mL IVPB  Status:  Discontinued     1 g 200 mL/hr over 30 Minutes Intravenous Every 24 hours 03/17/19 0240 03/17/19 1536   03/17/19 2200  cefTRIAXone (ROCEPHIN) 2 g in sodium chloride 0.9 % 100 mL IVPB  Status:  Discontinued     2 g 200 mL/hr over 30 Minutes Intravenous Every 24 hours 03/17/19 1536 03/20/19 0945   03/17/19 0130  cefTRIAXone (ROCEPHIN) 1 g in sodium chloride 0.9 % 100 mL IVPB     1 g 200 mL/hr over 30 Minutes Intravenous  Once 03/17/19 0123 03/17/19 0214       Objective   Vitals:   03/20/19 0037 03/20/19 0515 03/20/19 0743 03/20/19 1234  BP: (!) 105/56 (!) 113/49 (!) 118/52 (!) 110/52  Pulse: 61 61 68 60  Resp: 16 18 20 20   Temp: 98 F (36.7 C) 97.6 F (36.4 C) 97.7 F (36.5 C) 97.7 F (36.5 C)  TempSrc: Oral Oral Oral Oral  SpO2: 100% 96% 92% 100%  Weight:  (!) 158.5 kg    Height:        SpO2: 100 % O2 Flow Rate (L/min): 2 L/min  Wt Readings from Last 3 Encounters:  03/20/19 (!) 158.5 kg  02/28/19 (!) 150.1 kg  02/14/19 (!) 156 kg     Intake/Output Summary (Last 24 hours) at 03/20/2019 1444 Last data filed at 03/20/2019 1223 Gross per 24 hour  Intake 690.79 ml  Output 2075 ml  Net -1384.21 ml    Physical Exam:  Awake Alert, Oriented X 3, Normal affect No new F.N deficits,  [place].AT, No JVD appreciated Symmetrical Chest wall movement, Good air movement bilaterally, CTAB on room air RRR,systolic click and murmur heard +ve B.Sounds, Abd Soft, No tenderness, No organomegaly appreciated, No rebound, guarding or rigidity. Right leg 2+ edema to above thigh,  circumferential redness and warmth without tenderness on calf, left leg 1 + edema to above thigh           I have personally reviewed the following:   Data Reviewed:  CBC Recent Labs  Lab 03/16/19 2359 03/17/19 0743 03/18/19 0310 03/19/19 0220 03/19/19 2315 03/20/19 0430  WBC 29.8* 42.8* 36.1* 24.6*  --  15.9*  HGB 8.9* 8.3* 7.9* 7.5* 8.7* 8.5*  HCT 28.7* 27.0* 24.3* 23.3* 26.8* 26.0*  PLT 270 224 195 202  --  216  MCV 101.8* 103.1* 98.8 98.3  --  98.1  MCH 31.6 31.7 32.1 31.6  --  32.1  MCHC 31.0 30.7 32.5 32.2  --  32.7  RDW 17.0* 17.1* 17.0* 17.0*  --  17.6*  LYMPHSABS 0.7  --   --   --   --   --   MONOABS 2.0*  --   --   --   --   --   EOSABS 0.0  --   --   --   --   --   BASOSABS 0.1  --   --   --   --   --     Chemistries  Recent Labs  Lab 03/17/19 0743 03/18/19 0310 03/19/19 0220 03/19/19 0815 03/19/19 1610 03/19/19 2315 03/20/19 0430 03/20/19 0745 03/20/19 1140  NA 135 129* 128* 129*  --   --  130*  --   --   K 4.6 5.1 5.5* 5.2* 5.5* 5.1 4.8 4.8 5.4*  CL 97* 92* 92* 92*  --   --  93*  --   --   CO2 18* 25 23 21*  --   --  23  --   --   GLUCOSE 90 90 94 107*  --   --  87  --   --   BUN 61* 79* 92* 91*  --   --  90*  --   --   CREATININE 3.05* 3.53* 3.65* 3.51*  --   --  2.83*  --   --   CALCIUM 9.1 8.8* 8.7* 8.7*  --   --  8.7*  --   --    ------------------------------------------------------------------------------------------------------------------ No results for input(s):  CHOL, HDL, LDLCALC, TRIG, CHOLHDL, LDLDIRECT in the last 72 hours.  Lab Results  Component Value Date   HGBA1C 5.7 (H) 09/18/2017   ------------------------------------------------------------------------------------------------------------------ No results for input(s): TSH, T4TOTAL, T3FREE, THYROIDAB in the last 72 hours.  Invalid input(s): FREET3 ------------------------------------------------------------------------------------------------------------------ No  results for input(s): VITAMINB12, FOLATE, FERRITIN, TIBC, IRON, RETICCTPCT in the last 72 hours.  Coagulation profile Recent Labs  Lab 03/16/19 2359 03/17/19 0743 03/18/19 0310 03/19/19 0220 03/20/19 0430  INR 4.7* 5.2* 6.3* 2.7* 2.2*    No results for input(s): DDIMER in the last 72 hours.  Cardiac Enzymes No results for input(s): CKMB, TROPONINI, MYOGLOBIN in the last 168 hours.  Invalid input(s): CK ------------------------------------------------------------------------------------------------------------------    Component Value Date/Time   BNP 105.7 (H) 03/16/2019 2359   BNP 31.3 07/28/2015 0829    Micro Results Recent Results (from the past 240 hour(s))  Blood culture (routine x 2)     Status: Abnormal   Collection Time: 03/16/19 11:59 PM   Specimen: BLOOD  Result Value Ref Range Status   Specimen Description BLOOD RIGHT HAND  Final   Special Requests   Final    BOTTLES DRAWN AEROBIC AND ANAEROBIC Blood Culture results may not be optimal due to an inadequate volume of blood received in culture bottles   Culture  Setup Time   Final    GRAM POSITIVE COCCI IN CHAINS AEROBIC BOTTLE ONLY CRITICAL RESULT CALLED TO, READ BACK BY AND VERIFIED WITH: E. SINCLAIR, PHARMD AT 0211 ON 03/17/19 BY C. JESSUP, MT.    Culture (A)  Final    STREPTOCOCCUS GROUP G SUSCEPTIBILITIES PERFORMED ON PREVIOUS CULTURE WITHIN THE LAST 5 DAYS. Performed at [facility], [place] [address], [place], [place] [address]    Report Status 03/19/2019 FINAL  Final  Blood culture (routine x 2)     Status: Abnormal   Collection Time: 03/16/19 11:59 PM   Specimen: BLOOD  Result Value Ref Range Status   Specimen Description BLOOD LEFT HAND  Final   Special Requests   Final    BOTTLES DRAWN AEROBIC AND ANAEROBIC Blood Culture adequate volume   Culture  Setup Time   Final    GRAM POSITIVE COCCI IN CHAINS IN BOTH AEROBIC AND ANAEROBIC BOTTLES CRITICAL RESULT CALLED TO, READ BACK BY AND VERIFIED  WITHBronwen Betters [facility] 7014 03/17/19 A BROWNING Performed at [facility], [place] [address], [place], [place] [address]    Culture STREPTOCOCCUS GROUP G (A)  Final   Report Status 03/19/2019 FINAL  Final   Organism ID, Bacteria STREPTOCOCCUS GROUP G  Final      Susceptibility   Streptococcus group g - MIC*    CLINDAMYCIN <=0.25 SENSITIVE Sensitive     AMPICILLIN <=0.25 SENSITIVE Sensitive     ERYTHROMYCIN <=0.12 SENSITIVE Sensitive     VANCOMYCIN 0.5 SENSITIVE Sensitive     CEFTRIAXONE <=0.12 SENSITIVE Sensitive     LEVOFLOXACIN 0.5 SENSITIVE Sensitive     PENICILLIN Value in next row Sensitive      SENSITIVE<=0.06    * STREPTOCOCCUS GROUP G  Blood Culture ID Panel (Reflexed)     Status: Abnormal   Collection Time: 03/16/19 11:59 PM  Result Value Ref Range Status   Enterococcus species NOT DETECTED NOT DETECTED Final   Listeria monocytogenes NOT DETECTED NOT DETECTED Final   Staphylococcus species NOT DETECTED NOT DETECTED Final   Staphylococcus aureus (BCID) NOT DETECTED NOT DETECTED Final   Streptococcus species DETECTED (A) NOT DETECTED Final  Comment: Not Enterococcus species, Streptococcus agalactiae, Streptococcus pyogenes, or Streptococcus pneumoniae. CRITICAL RESULT CALLED TO, READ BACK BY AND VERIFIED WITH: Bronwen Betters PHARMD 1741 03/17/19 A BROWNING    Streptococcus agalactiae NOT DETECTED NOT DETECTED Final   Streptococcus pneumoniae NOT DETECTED NOT DETECTED Final   Streptococcus pyogenes NOT DETECTED NOT DETECTED Final   Acinetobacter baumannii NOT DETECTED NOT DETECTED Final   Enterobacteriaceae species NOT DETECTED NOT DETECTED Final   Enterobacter cloacae complex NOT DETECTED NOT DETECTED Final   Escherichia coli NOT DETECTED NOT DETECTED Final   Klebsiella oxytoca NOT DETECTED NOT DETECTED Final   Klebsiella pneumoniae NOT DETECTED NOT DETECTED Final   Proteus species NOT DETECTED NOT DETECTED Final   Serratia marcescens NOT DETECTED NOT DETECTED Final    Haemophilus influenzae NOT DETECTED NOT DETECTED Final   Neisseria meningitidis NOT DETECTED NOT DETECTED Final   Pseudomonas aeruginosa NOT DETECTED NOT DETECTED Final   Candida albicans NOT DETECTED NOT DETECTED Final   Candida glabrata NOT DETECTED NOT DETECTED Final   Candida krusei NOT DETECTED NOT DETECTED Final   Candida parapsilosis NOT DETECTED NOT DETECTED Final   Candida tropicalis NOT DETECTED NOT DETECTED Final    Comment: Performed at [facility], [place]. [address], [place], [place] [address]  SARS CORONAVIRUS 2 (TAT 6-24 HRS) Nasopharyngeal Nasopharyngeal Swab     Status: None   Collection Time: 03/17/19 12:32 AM   Specimen: Nasopharyngeal Swab  Result Value Ref Range Status   SARS Coronavirus 2 NEGATIVE NEGATIVE Final    Comment: (NOTE) SARS-CoV-2 target nucleic acids are NOT DETECTED. The SARS-CoV-2 RNA is generally detectable in upper and lower respiratory specimens during the acute phase of infection. Negative results do not preclude SARS-CoV-2 infection, do not rule out co-infections with other pathogens, and should not be used as the sole basis for treatment or other patient management decisions. Negative results must be combined with clinical observations, patient history, and epidemiological information. The expected result is Negative. Fact Sheet for Patients: SugarRoll.be Fact Sheet for Healthcare Providers: https://www.woods-mathews.com/ This test is not yet approved or cleared by the [place] FDA and  has been authorized for detection and/or diagnosis of SARS-CoV-2 by FDA under an Emergency Use Authorization (EUA). This EUA will remain  in effect (meaning this test can be used) for the duration of the COVID-19 declaration under Section 56 4(b)(1) of the Act, 21 U.S.C. section 360bbb-3(b)(1), unless the authorization is terminated or revoked sooner. Performed at [facility], [place] [address],  [place], [place] [address]   Urine culture     Status: Abnormal   Collection Time: 03/17/19 12:54 AM   Specimen: Urine, Random  Result Value Ref Range Status   Specimen Description URINE, RANDOM  Final   Special Requests   Final    NONE Performed at [facility], [place] [address], [place],  [address]    Culture MULTIPLE SPECIES PRESENT, SUGGEST RECOLLECTION (A)  Final   Report Status 03/18/2019 FINAL  Final  Culture, blood (Routine X 2) w Reflex to ID Panel     Status: None (Preliminary result)   Collection Time: 03/19/19  4:02 PM   Specimen: BLOOD RIGHT HAND  Result Value Ref Range Status   Specimen Description BLOOD RIGHT HAND  Final   Special Requests   Final    BOTTLES DRAWN AEROBIC AND ANAEROBIC Blood Culture results may not be optimal due to an inadequate volume of blood received in culture bottles   Culture  Final    NO GROWTH < 24 HOURS Performed at [facility], [place] [address], [place], [place] [address]    Report Status PENDING  Incomplete  Culture, blood (Routine X 2) w Reflex to ID Panel     Status: None (Preliminary result)   Collection Time: 03/19/19  4:10 PM   Specimen: BLOOD RIGHT HAND  Result Value Ref Range Status   Specimen Description BLOOD RIGHT HAND  Final   Special Requests   Final    BOTTLES DRAWN AEROBIC AND ANAEROBIC Blood Culture results may not be optimal due to an inadequate volume of blood received in culture bottles   Culture   Final    NO GROWTH < 24 HOURS Performed at [facility], [place] [address], [place], [place] [address]    Report Status PENDING  Incomplete    Radiology Reports DG Chest Port 1 View  Result Date: 03/16/2019 CLINICAL DATA:  Shortness of breath EXAM: PORTABLE CHEST 1 VIEW COMPARISON:  July 10, 2018 FINDINGS: The heart size is significantly enlarged. The patient is status post prior median sternotomy. Aortic calcifications are noted. There is a multi lead right-sided pacemaker in place. There is vascular  congestion without overt pulmonary edema. There is no pneumothorax or large pleural effusion. There is no acute osseous abnormality. IMPRESSION: Cardiomegaly with mild volume overload. Electronically Signed   By: Constance Holster M.D.   On: 03/16/2019 23:50   ECHOCARDIOGRAM COMPLETE  Result Date: 03/19/2019   ECHOCARDIOGRAM REPORT   Patient Name:   Frank [name] Dancy Date of Exam: 03/19/2019 Medical Rec #:  [id number]       Height:       71.0 in Accession #:    [id number]      Weight:       345.7 lb Date of Birth:  1945-11-14      BSA:          2.66 m Patient Age:    37 years        BP:           99/43 mmHg Patient Gender: M               HR:           60 bpm. Exam Location:  Inpatient Procedure: 2D Echo, Cardiac Doppler, Color Doppler and Intracardiac            Opacification Agent                            MODIFIED REPORT: This report was modified by Eleonore Chiquito MD on 03/19/2019 due to error.  Indications:     Bacteremia.  History:         Patient has prior history of Echocardiogram examinations, most                  recent 02/14/2019. Non- ischemic cardiomyopathy and CHF,                  Pacemaker and Abnormal ECG, TIA and Pulmonary HTN, Mitral Valve                  Disease, Arrythmias:Complete heart block; Risk                  Factors:Hypertension. Mitral Valve: mechanical valve is present                  in the  mitral position.  Sonographer:     Roseanna Rainbow RDCS Referring Phys:  [id number] [name] Diagnosing Phys: Eleonore Chiquito MD  Sonographer Comments: Technically difficult study due to poor echo windows, Technically challenging study due to limited acoustic windows, patient is morbidly obese, suboptimal subcostal window, suboptimal apical window and suboptimal parasternal window.  Image acquisition challenging due to patient body habitus. IMPRESSIONS  1. No evidence of infective endocarditis on this study. A TEE is recommended to exclude endocarditis.  2. The patient is s/p myomectomy with mechanical  MVR and AoV repair (reported in record). Overall, the EF remains unchanged with prior. The mechanical MV prosthesis is functioning normally. The AoV is severely calcified with restricted movement. I suspect there is at least mild to moderate aortic stenosis, but doppler interrogation on this study is suboptimal. The tricuspid regurgitation on this study is severe with systolic hepatic vein flow reversal (not well evaluated on last study). Findings compared directly with study from 02/14/2019.  3. Left ventricular ejection fraction, by visual estimation, is 55 to 60%. The left ventricle has normal function. There is mildly increased left ventricular hypertrophy.  4. Definity contrast agent was given IV to delineate the left ventricular endocardial borders.  5. Left ventricular diastolic function could not be evaluated.  6. Right ventricular volume overload.  7. The left ventricle has no regional wall motion abnormalities.  8. Global right ventricle has severely reduced systolic function.The right ventricular size is severely enlarged. No increase in right ventricular wall thickness.  9. Left atrial size was mild-moderately dilated. 10. Right atrial size was severely dilated. 11. The mitral valve has been repaired/replaced. No evidence of mitral valve regurgitation. 12. Mechanical mitral valve present. Mean gradient 5 mmHG with heart rate 60 bpm. EOA 5 mmHG. Normal functioning prosthetic valve with stable gradients compared with prior. 13. The tricuspid valve is grossly normal. Tricuspid valve regurgitation is severe. 14. Aortic valve regurgitation is mild. Mild to moderate aortic valve stenosis. 15. The aortic valve is severely calcified with restricted leaflet motion. Doppler interrogation is poor on this study. Vmax 2.5 m/s, Mean gradient ~8.5 mmHG, but suspect this is severely underestimated. There is likely a mild to moderate degree of aortic stenosis present. 16. The pulmonic valve was grossly normal. Pulmonic  valve regurgitation is not visualized. 17. Mild plaque invoving the aortic root. 18. Normal pulmonary artery systolic pressure. 19. The tricuspid regurgitant velocity is 1.76 m/s, and with an assumed right atrial pressure of 15 mmHg, the estimated right ventricular systolic pressure is normal at 27.4 mmHg. 20. A pacer wire is visualized in the RA and RV. FINDINGS  Left Ventricle: Left ventricular ejection fraction, by visual estimation, is 55 to 60%. The left ventricle has normal function. Definity contrast agent was given IV to delineate the left ventricular endocardial borders. The left ventricle has no regional wall motion abnormalities. There is mildly increased left ventricular hypertrophy. Concentric left ventricular hypertrophy. The interventricular septum is flattened in diastole ('D' shaped left ventricle), consistent with right ventricular volume overload. The left ventricular diastology could not be evaluated due to mitral valve replacement/repair. Left ventricular diastolic function could not be evaluated. Right Ventricle: The right ventricular size is severely enlarged. No increase in right ventricular wall thickness. Global RV systolic function is has severely reduced systolic function. The tricuspid regurgitant velocity is 1.76 m/s, and with an assumed right atrial pressure of 15 mmHg, the estimated right ventricular systolic pressure is normal at 27.4 mmHg. Left Atrium: Left atrial size was mild-moderately  dilated. Right Atrium: Right atrial size was severely dilated Pericardium: There is no evidence of pericardial effusion. Mitral Valve: The mitral valve has been repaired/replaced. No evidence of mitral valve regurgitation. MV peak gradient, 19.1 mmHg. Mechanical mitral valve present. Mean gradient 5 mmHG with heart rate 60 bpm. EOA 5 mmHG. Normal functioning prosthetic valve with stable gradients compared with prior. Tricuspid Valve: The tricuspid valve is grossly normal. Tricuspid valve  regurgitation is severe. The flow in the hepatic veins is reversed during ventricular systole. Aortic Valve: The aortic valve has been repaired/replaced. Aortic valve regurgitation is mild. Mild to moderate aortic stenosis is present. Aortic valve mean gradient measures 8.5 mmHg. Aortic valve peak gradient measures 16.2 mmHg. Aortic valve area, by  VTI measures 2.40 cm. The aortic valve is severely calcified with restricted leaflet motion. Doppler interrogation is poor on this study. Vmax 2.5 m/s, Mean gradient ~8.5 mmHG, but suspect this is severely underestimated. There is likely a mild to moderate degree of aortic stenosis present. Pulmonic Valve: The pulmonic valve was grossly normal. Pulmonic valve regurgitation is not visualized. Pulmonic regurgitation is not visualized. Aorta: The aortic root is normal in size and structure. There is mild, layered plaque involving the aortic root. IAS/Shunts: No atrial level shunt detected by color flow Doppler. Additional Comments: A pacer wire is visualized in the right atrium and right ventricle.  LEFT VENTRICLE PLAX 2D LVIDd:         4.10 cm       Diastology LVIDs:         3.00 cm       LV e' lateral:   11.70 cm/s LV PW:         2.10 cm       LV E/e' lateral: 12.3 LV IVS:        1.70 cm       LV e' medial:    8.59 cm/s LVOT diam:     2.20 cm       LV E/e' medial:  16.8 LV SV:         39 ml LV SV Index:   13.60 LVOT Area:     3.80 cm  LV Volumes (MOD) LV area d, A2C:    33.90 cm LV area d, A4C:    38.70 cm LV area s, A2C:    23.20 cm LV area s, A4C:    25.93 cm LV major d, A2C:   7.96 cm LV major d, A4C:   8.64 cm LV major s, A2C:   7.74 cm LV major s, A4C:   8.04 cm LV vol d, MOD A2C: 120.0 ml LV vol d, MOD A4C: 147.4 ml LV vol s, MOD A2C: 58.2 ml LV vol s, MOD A4C: 71.6 ml LV SV MOD A2C:     61.8 ml LV SV MOD A4C:     147.4 ml LV SV MOD BP:      71.1 ml RIGHT VENTRICLE            IVC RV S prime:     8.81 cm/s  IVC diam: 4.70 cm TAPSE (M-mode): 1.5 cm LEFT ATRIUM             Index       RIGHT ATRIUM           Index LA diam:      6.40 cm  2.41 cm/m  RA Area:     42.00 cm LA Vol (A2C): 108.0 ml 40.59 ml/m RA Volume:  177.00 ml 66.52 ml/m LA Vol (A4C): 147.0 ml 55.25 ml/m  AORTIC VALVE AV Area (Vmax):    2.51 cm AV Area (Vmean):   2.58 cm AV Area (VTI):     2.40 cm AV Vmax:           201.50 cm/s AV Vmean:          132.000 cm/s AV VTI:            0.419 m AV Peak Grad:      16.2 mmHg AV Mean Grad:      8.5 mmHg LVOT Vmax:         133.00 cm/s LVOT Vmean:        89.600 cm/s LVOT VTI:          0.264 m LVOT/AV VTI ratio: 0.63  AORTA Ao Root diam: 3.70 cm Ao Asc diam:  3.40 cm MITRAL VALVE                        TRICUSPID VALVE MV Area (PHT): 2.48 cm             TV Peak grad:   17.1 mmHg MV Peak grad:  19.1 mmHg            TV Mean grad:   5.0 mmHg MV Mean grad:  5.0 mmHg             TV Vmax:        2.07 m/s MV Vmax:       2.18 m/s             TV Vmean:       93.2 cm/s MV Vmean:      93.5 cm/s            TV VTI:         0.61 msec MV VTI:        0.54 m               TR Peak grad:   12.4 mmHg MV PHT:        88.74 msec           TR Vmax:        178.00 cm/s MV Decel Time: 306 msec MV E velocity: 144.00 cm/s 103 cm/s SHUNTS                                     Systemic VTI:  0.26 m                                     Systemic Diam: 2.20 cm  Eleonore Chiquito MD Electronically signed by Eleonore Chiquito MD Signature Date/Time: 03/19/2019/11:50:41 AM    Final (Updated)    CUP PACEART REMOTE DEVICE CHECK  Result Date: 03/06/2019 Scheduled remote reviewed.  Normal device function.  No atrial arrhythmia detected since last remote on 02/25/2019 Next remote 91 days. Kathy Breach, RN, CCDS, [employer]  VAS [name] LOWER EXTREMITY VENOUS (DVT)  Result Date: 03/17/2019  Lower Venous Study Indications: Swelling, and Edema.  Limitations: Body habitus and poor ultrasound/tissue interface. Comparison Study: no prior Performing Technologist: Abram Sander RVS  Examination Guidelines: A complete  evaluation includes B-mode imaging, spectral Doppler, color Doppler, and power Doppler as needed of all accessible portions of each  vessel. Bilateral testing is considered an integral part of a complete examination. Limited examinations for reoccurring indications may be performed as noted.  +---------+---------------+---------+-----------+----------+--------------+ RIGHT    CompressibilityPhasicitySpontaneityPropertiesThrombus Aging +---------+---------------+---------+-----------+----------+--------------+ CFV      Full           Yes      Yes                                 +---------+---------------+---------+-----------+----------+--------------+ SFJ      Full                                                        +---------+---------------+---------+-----------+----------+--------------+ FV Prox  Full                                                        +---------+---------------+---------+-----------+----------+--------------+ FV Mid                  Yes      Yes                                 +---------+---------------+---------+-----------+----------+--------------+ FV Distal               Yes      Yes                                 +---------+---------------+---------+-----------+----------+--------------+ PFV      Full                                                        +---------+---------------+---------+-----------+----------+--------------+ POP      Full           Yes      Yes                                 +---------+---------------+---------+-----------+----------+--------------+ PTV                                                   Not visualized +---------+---------------+---------+-----------+----------+--------------+ PERO                                                  Not visualized +---------+---------------+---------+-----------+----------+--------------+    +----+---------------+---------+-----------+----------+--------------+ LEFTCompressibilityPhasicitySpontaneityPropertiesThrombus Aging +----+---------------+---------+-----------+----------+--------------+ CFV                Yes      Yes                                 +----+---------------+---------+-----------+----------+--------------+  Summary: Right: There is no evidence of deep vein thrombosis in the lower extremity. However, portions of this examination were limited- see technologist comments above. No cystic structure found in the popliteal fossa. Left: No evidence of common femoral vein obstruction.  *See table(s) above for measurements and observations. Electronically signed by Servando Snare MD on 03/17/2019 at 5:03:22 PM.    Final      Time Spent in minutes  30     Desiree Hane M.D on 03/20/2019 at 2:44 PM  To page go to [email or web address] - password [facility]

## 2019-03-20 NOTE — Progress Notes (Signed)
[facility] for Infectious Disease  Date of Admission:  03/16/2019     Total days of antibiotics 5 Ceftriaxone 12/6 >> 12/10 Penicillin G 12/10 >>         ASSESSMENT:  Mr. Cadavid has improving leukocytosis and has continued to remain afebrile with treatment for Group G streptococcus bacteremia with source likely being his right leg cellulitis which he is having increased pain today but appears improved since admission. Repeat blood cultures without growth in <24 hours. TTE without evidence of vegetation and is scheduled for TEE tomorrow. Duration of treatment pending TEE results. Will narrow antimicrobial therapy from ceftriaxone to Penicillin G.   PLAN:  1. Discontinue ceftriaxone. 2. Start Penicillin G per pharmacy 3. Await TEE results.  4. Monitor culture results for clearance of bacteremia.  5. May benefit from sleep apnea testing.   Principal Problem:   UTI (urinary tract infection) Active Problems:   S/P aortic valve repair   S/P mitral valve replacement-Mechanical   Chronic combined systolic and diastolic heart failure (HCC)   Complete heart block (HCC)   Rectal bleeding   Sepsis (HCC)   Acute respiratory failure with hypoxia (HCC)   CHF exacerbation (HCC)   AKI (acute kidney injury) (HCC)   Group G streptococcal infection   Bacteremia   . sodium chloride   Intravenous Once  . psyllium  1 packet Oral Daily    SUBJECTIVE:  Afebrile overnight with continued improving leukocytosis. Having increased pain in his right leg.   Allergies  Allergen Reactions  . Warfarin And Related Other (See Comments)    COUMADIN or JANTOVEN BRAND ONLY** Per pt, was switched to generic in 2001 and had significantly decreased absorption > INR subtherapeutic> pt suffered a TIA and has been on brand name only since      Review of Systems: Review of Systems  Constitutional: Negative for chills, fever and weight loss.  Respiratory: Negative for cough, shortness of breath and  wheezing.   Cardiovascular: Negative for chest pain and leg swelling.  Gastrointestinal: Negative for abdominal pain, constipation, diarrhea, nausea and vomiting.  Musculoskeletal:       Positive for lymphedema and redness of the right leg.   Skin: Negative for rash.      OBJECTIVE: Vitals:   03/19/19 2045 03/20/19 0037 03/20/19 0515 03/20/19 0743  BP: 118/64 (!) 105/56 (!) 113/49 (!) 118/52  Pulse: 63 61 61 68  Resp: 20 16 18 20   Temp: 98.5 F (36.9 C) 98 F (36.7 C) 97.6 F (36.4 C) 97.7 F (36.5 C)  TempSrc: Oral Oral Oral Oral  SpO2: 97% 100% 96% 92%  Weight:   (!) 158.5 kg   Height:       Body mass index is 48.74 kg/m.  Physical Exam Constitutional:      General: He is not in acute distress.    Appearance: He is well-developed. He is obese.     Comments: Lying in bed sleeping; periods of apena; easily aroused.   Cardiovascular:     Rate and Rhythm: Normal rate and regular rhythm.     Heart sounds: Murmur present.     Comments: Mechanical click noted.  Pulmonary:     Effort: Pulmonary effort is normal.     Breath sounds: Normal breath sounds.  Musculoskeletal:     Comments: Right distal lower extremity is with significant lymphedema and mildly red.  Skin:    General: Skin is warm and dry.  Neurological:     Mental  Status: He is alert and oriented to person, place, and time.  Psychiatric:        Behavior: Behavior normal.        Thought Content: Thought content normal.        Judgment: Judgment normal.        03/16/19    03/20/19  Lab Results Lab Results  Component Value Date   WBC 15.9 (H) 03/20/2019   HGB 8.5 (L) 03/20/2019   HCT 26.0 (L) 03/20/2019   MCV 98.1 03/20/2019   PLT 216 03/20/2019    Lab Results  Component Value Date   CREATININE 2.83 (H) 03/20/2019   BUN 90 (H) 03/20/2019   NA 130 (L) 03/20/2019   K 4.8 03/20/2019   CL 93 (L) 03/20/2019   CO2 23 03/20/2019    Lab Results  Component Value Date   ALT 15 (L) 01/18/2017   AST  30 01/18/2017   ALKPHOS 77 01/18/2017   BILITOT 1.4 (H) 01/18/2017     Microbiology: Recent Results (from the past 240 hour(s))  Blood culture (routine x 2)     Status: Abnormal   Collection Time: 03/16/19 11:59 PM   Specimen: BLOOD  Result Value Ref Range Status   Specimen Description BLOOD RIGHT HAND  Final   Special Requests   Final    BOTTLES DRAWN AEROBIC AND ANAEROBIC Blood Culture results may not be optimal due to an inadequate volume of blood received in culture bottles   Culture  Setup Time   Final    GRAM POSITIVE COCCI IN CHAINS AEROBIC BOTTLE ONLY CRITICAL RESULT CALLED TO, READ BACK BY AND VERIFIED WITH: E. SINCLAIR, PHARMD AT 1530 ON 03/17/19 BY C. JESSUP, MT.    Culture (A)  Final    STREPTOCOCCUS GROUP G SUSCEPTIBILITIES PERFORMED ON PREVIOUS CULTURE WITHIN THE LAST 5 DAYS. Performed at [facility], [place] [address] [place], [place] [address]    Report Status 03/19/2019 FINAL  Final  Blood culture (routine x 2)     Status: Abnormal   Collection Time: 03/16/19 11:59 PM   Specimen: BLOOD  Result Value Ref Range Status   Specimen Description BLOOD LEFT HAND  Final   Special Requests   Final    BOTTLES DRAWN AEROBIC AND ANAEROBIC Blood Culture adequate volume   Culture  Setup Time   Final    GRAM POSITIVE COCCI IN CHAINS IN BOTH AEROBIC AND ANAEROBIC BOTTLES CRITICAL RESULT CALLED TO, READ BACK BY AND VERIFIED WITHBronwen Betters [facility] [id number] 03/17/19 A BROWNING Performed at [facility], [place] [address], [place], [place] [address]    Culture STREPTOCOCCUS GROUP G (A)  Final   Report Status 03/19/2019 FINAL  Final   Organism ID, Bacteria STREPTOCOCCUS GROUP G  Final      Susceptibility   Streptococcus group g - MIC*    CLINDAMYCIN <=0.25 SENSITIVE Sensitive     AMPICILLIN <=0.25 SENSITIVE Sensitive     ERYTHROMYCIN <=0.12 SENSITIVE Sensitive     VANCOMYCIN 0.5 SENSITIVE Sensitive     CEFTRIAXONE <=0.12 SENSITIVE Sensitive     LEVOFLOXACIN 0.5 SENSITIVE  Sensitive     PENICILLIN Value in next row Sensitive      SENSITIVE<=0.06    * STREPTOCOCCUS GROUP G  Blood Culture ID Panel (Reflexed)     Status: Abnormal   Collection Time: 03/16/19 11:59 PM  Result Value Ref Range Status   Enterococcus species NOT DETECTED NOT DETECTED Final   Listeria monocytogenes NOT DETECTED  NOT DETECTED Final   Staphylococcus species NOT DETECTED NOT DETECTED Final   Staphylococcus aureus (BCID) NOT DETECTED NOT DETECTED Final   Streptococcus species DETECTED (A) NOT DETECTED Final    Comment: Not Enterococcus species, Streptococcus agalactiae, Streptococcus pyogenes, or Streptococcus pneumoniae. CRITICAL RESULT CALLED TO, READ BACK BY AND VERIFIED WITH: Bronwen Betters PHARMD [id number] 03/17/19 A BROWNING    Streptococcus agalactiae NOT DETECTED NOT DETECTED Final   Streptococcus pneumoniae NOT DETECTED NOT DETECTED Final   Streptococcus pyogenes NOT DETECTED NOT DETECTED Final   Acinetobacter baumannii NOT DETECTED NOT DETECTED Final   Enterobacteriaceae species NOT DETECTED NOT DETECTED Final   Enterobacter cloacae complex NOT DETECTED NOT DETECTED Final   Escherichia coli NOT DETECTED NOT DETECTED Final   Klebsiella oxytoca NOT DETECTED NOT DETECTED Final   Klebsiella pneumoniae NOT DETECTED NOT DETECTED Final   Proteus species NOT DETECTED NOT DETECTED Final   Serratia marcescens NOT DETECTED NOT DETECTED Final   Haemophilus influenzae NOT DETECTED NOT DETECTED Final   Neisseria meningitidis NOT DETECTED NOT DETECTED Final   Pseudomonas aeruginosa NOT DETECTED NOT DETECTED Final   Candida albicans NOT DETECTED NOT DETECTED Final   Candida glabrata NOT DETECTED NOT DETECTED Final   Candida krusei NOT DETECTED NOT DETECTED Final   Candida parapsilosis NOT DETECTED NOT DETECTED Final   Candida tropicalis NOT DETECTED NOT DETECTED Final    Comment: Performed at [facility], [place], [place], [place] [address]  SARS CORONAVIRUS 2 (TAT 6-24 HRS)  Nasopharyngeal Nasopharyngeal Swab     Status: None   Collection Time: 03/17/19 12:32 AM   Specimen: Nasopharyngeal Swab  Result Value Ref Range Status   SARS Coronavirus 2 NEGATIVE NEGATIVE Final    Comment: (NOTE) SARS-CoV-2 target nucleic acids are NOT DETECTED. The SARS-CoV-2 RNA is generally detectable in upper and lower respiratory specimens during the acute phase of infection. Negative results do not preclude SARS-CoV-2 infection, do not rule out co-infections with other pathogens, and should not be used as the sole basis for treatment or other patient management decisions. Negative results must be combined with clinical observations, patient history, and epidemiological information. The expected result is Negative. Fact Sheet for Patients: SugarRoll.be Fact Sheet for Healthcare Providers: https://www.woods-mathews.com/ This test is not yet approved or cleared by the [place] FDA and  has been authorized for detection and/or diagnosis of SARS-CoV-2 by FDA under an Emergency Use Authorization (EUA). This EUA will remain  in effect (meaning this test can be used) for the duration of the COVID-19 declaration under Section 56 4(b)(1) of the Act, 21 U.S.C. section 360bbb-3(b)(1), unless the authorization is terminated or revoked sooner. Performed at [facility], [place] [address], [place], [place] [address]   Urine culture     Status: Abnormal   Collection Time: 03/17/19 12:54 AM   Specimen: Urine, Random  Result Value Ref Range Status   Specimen Description URINE, RANDOM  Final   Special Requests   Final    NONE Performed at [facility], [place] [address] [place], [place] [address]    Culture MULTIPLE SPECIES PRESENT, SUGGEST RECOLLECTION (A)  Final   Report Status 03/18/2019 FINAL  Final  Culture, blood (Routine X 2) w Reflex to ID Panel     Status: None (Preliminary result)   Collection Time: 03/19/19  4:02 PM    Specimen: BLOOD RIGHT HAND  Result Value Ref Range Status   Specimen Description BLOOD RIGHT HAND  Final  Special Requests   Final    BOTTLES DRAWN AEROBIC AND ANAEROBIC Blood Culture results may not be optimal due to an inadequate volume of blood received in culture bottles   Culture   Final    NO GROWTH < 24 HOURS Performed at [place] [address], [place], [place] [address]    Report Status PENDING  Incomplete  Culture, blood (Routine X 2) w Reflex to ID Panel     Status: None (Preliminary result)   Collection Time: 03/19/19  4:10 PM   Specimen: BLOOD RIGHT HAND  Result Value Ref Range Status   Specimen Description BLOOD RIGHT HAND  Final   Special Requests   Final    BOTTLES DRAWN AEROBIC AND ANAEROBIC Blood Culture results may not be optimal due to an inadequate volume of blood received in culture bottles   Culture   Final    NO GROWTH < 24 HOURS Performed at [facility], [place] [address], [place], [place] [address]    Report Status PENDING  Incomplete     Terri Piedra, [place] [facility] [place] Pager  03/20/2019  10:30 AM

## 2019-03-20 NOTE — Progress Notes (Signed)
OT Cancellation Note  Patient Details Name: [place] [name] MRN: [id number] DOB: Dec 25, 1945   Cancelled Treatment:    Reason Eval/Treat Not Completed: Patient declined, no reason specified; pt declined, reports pain in R LE and reports " I just dont feel like doing anything".  Pt requests therapist to check back this afternoon.   Delight Stare, OT Acute Rehabilitation Services Pager [phone] Office [phone]   Delight Stare 03/20/2019, 11:05 AM

## 2019-03-20 NOTE — Progress Notes (Signed)
Pt started complaining this AM about increased pain in his right leg.  The right leg appeared more swollen than previously assessed.  Was able to palpate a pedal pulse. Right foot was very sensitive to touch.  Informed day nurse.  Would like physician to assess his leg.  Lupita Dawn, RN

## 2019-03-20 NOTE — Progress Notes (Signed)
ANTICOAGULATION CONSULT NOTE - Follow Up Consult  Pharmacy Consult for Heparin; Coumadin (on hold) Indication: mechanical MVR (1999) and atrial fibrillation  Allergies  Allergen Reactions  . Warfarin And Related Other (See Comments)    COUMADIN or JANTOVEN BRAND ONLY** Per pt, was switched to generic in 2001 and had significantly decreased absorption > INR subtherapeutic> pt suffered a TIA and has been on brand name only since     Patient Measurements: Height: 5\' 11"  (180.3 cm) Weight: (!) 349 lb 6.9 oz (158.5 kg) IBW/kg (Calculated) : 75.3 Heparin Dosing Weight: 111 kg  Vital Signs: Temp: 97.7 F (36.5 C) (12/10 0743) Temp Source: Oral (12/10 0743) BP: 118/52 (12/10 0743) Pulse Rate: 68 (12/10 0743)  Labs: Recent Labs    03/18/19 0310 03/19/19 0220 03/19/19 0815 03/19/19 2315 03/20/19 0430 03/20/19 0745  HGB 7.9* 7.5*  --  8.7* 8.5*  --   HCT 24.3* 23.3*  --  26.8* 26.0*  --   PLT 195 202  --   --  216  --   LABPROT 55.9* 28.5*  --   --  24.5*  --   INR 6.3* 2.7*  --   --  2.2*  --   HEPARINUNFRC  --   --   --   --   --  <0.10*  CREATININE 3.53* 3.65* 3.51*  --  2.83*  --     Estimated Creatinine Clearance: 37.3 mL/min (A) (by C-G formula based on SCr of 2.83 mg/dL (H)).  Assessment:  73 yo M on Coumadin for mechanical MVR 1999 and Afib admitted with melena and INR of 5.2> inc 6.3 despite holding warfarin doses. Vitamin K 2 mg IV given on 12/8 >  INR 2.7  - Pharmacy was consulted to dose warfarin, with plans to continue PTA BRAND COUMADIN. Currently on hold for TEE on 12/11.  IV heparin begun 12/9 pm.    Initial heparin level is undetectable (<0.10) on 1100 units/hr.  INR down to 2.2.  No known infusion problems.  Hgb up to 8.5 after 1 unit PRBCs on 12/9.  Intermittent rectal bleeding as PTA. Patient reports thought due to internal and external hemorrhoids, per colonoscopy about 3 weeks ago.   Of note, patient takes COUMADIN or JANTOVEN BRAND ONLY** Per pt, was  switched to generic in 2001 and had significantly decreased absorption > INR subtherapeutic> pt suffered a TIA and has been on brand name only since then. Wife brought in home supply since we only have generic warfarin.    Home Coumadin: 15 mg Sun/Wed, 10 all other days (stable dose since 2019). Per patient, has been drinking vinegar and eating more picked vegetables, but otherwise no change in diet.  Goal of Therapy:  Heparin level 0.3-0.7 units/ml INR 2.5 - 3.5 Monitor platelets by anticoagulation protocol: Yes   Plan:  Increase heparin drip to 1450 units/hr Heparin level ~6 hrs after rate change. Coumadin on hold.  Home supply of name brand stored in Rx. Daily heparin level, PT/INR and CBC.  [name], [place] Pager: [phone] or phone: [phone] 03/20/2019,12:22 PM

## 2019-03-20 NOTE — Progress Notes (Signed)
[facility]  PROGRESS NOTE  Frank [name] HER:[id number] DOB: 1946/02/02 DOA: 03/16/2019 PCP: Frank Ruddy, MD Admit date - 03/16/2019   Admitting Physician [name], MD  Outpatient Primary MD for the patient is Frank Ruddy, MD  LOS - 3 Brief Narrative   Frank [name] is a 73 y.o. 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  In the ED found to be febrile to 103, tachypneic, hypoxic to 86% on room air requiring 2 L, WBC 29, lactic acid 2.8, creatinine 2.5, fecal occult positive, Covid test negative.  UA remarkable for large leukocytes, greater than 50 WBCs, rare bacteria Chest x-ray showed cardiomegaly with mild volume overload. 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  TTE negative, currently awaiting TEE, continuing ceftriaxone as recommended by ID  Subjective  Frank [name] today has no worsening shortness of breath, mild cough.  Denies chest pain, no abdominal pain, still urinating well.  A & P    Group G strep bacteremia and RLE nonpurulent cellulitis.   High concern for potential prosthetic valve endocarditis, remains afebrile, leukocytosis improving, but exquisite tenderness of right ankle/foot.  TTE without valvular involvement  -NPO at midnight TEE, will likely need INR to be less than 2, currently on heparin, ct  -Repeat blood cultures to document clearance negative so far  -disContinue IV ceftriaxone 2 g for  penicillin  -Check right ankle x-ray, doubt OM given improvement in other parameters  Sepsis, resolved. Likely related to group G strep bacteremia.  Initially admitted for possible UTI given large leuks but asymptomatic and no bacteria.  Currently afebrile,White count downtrending, BP better.   -Continue IV penicillin as mentioned above  Nonoliguric AKI on CKD stage III with hyperkalemia, improving. Likely related to hypotension related to sepsis/bacteremia.  Peak creatinine of 3.65, now 2.8, baseline 1.2-1.6.  UA unremarkable still making good urine, potassium wnl after lokelma  -Monitor urine output, follow BMP, avoid nephrotoxins  -If creatinine worsens or urine output decreases will obtain renal ultrasound formally consult  nephrology, anticipate improvement with IV fluids  -Timed IV fluids, carefully in setting of CHF  -Hold home losartan          Acute hypoxic respiratory failure, resolved.  Likely resultant of acute exacerbation of CHF in  the setting of infection/bacteremia.  Improving as patient is currently room air down from 2 L  24 hours ago.  Did not tolerate IV Lasix as blood pressure dropped  -We will hold off on further IV diuresis given stability of O2 and slowly resume home  torsemide and closely monitor weights, output    Hypotension, improving.  SBP range in 24 hours 107/64-118/52Could be related to sepsis/bacteremia. Had worsening drop with IV diuretics  -DC IVF given improvement in BP and avoid worsening CHF   Acute on chronicCombined systolic/diastolic CHF.    CXR with mild volume overload, currently on room air,  has significant B/l edema, R>L. Right swelling likely more related to cellulitis but have been holding home diuretics in setting of previous hypotension. Given weights are up 4 lbs and  volume overload on exam will resume oral diuresis  -Trial home torsemide 60 mg x 1 and monitor output and blood pressure, (home regimen 60 mg twice daily)  -Monitor ins and outs,  daily weights,   -Holding off on home metolazone/spironolactone, due to previous hypotension        Mechanical mitral valve/aortic valve.  Supratherapeutic on admission requiring vitamin K,             currently 2.2 will need to hold warfarin prior to TEE  -Hold warfarin, daily INR goal 92.5-3.5)  - heparin per pharmacy protocol        Anemia secondary to iron deficiency.  Goal hemoglobin greater than 8 given significant  cardiac history  -Transfuse 1 unit given hemoglobin currently 7.5  -Hold off on further iron supplementation in setting of concurrent infection         Paroxysmal atrial fibrillation, complete heart block status post pacemaker.  Currently                rate controlled in NSR.  -Followed by Dr. [name] as outpatient continue to closely monitor        Chronic right lower leg edema.  Venous duplex negative for DVT  -Continue to monitor        Rectal bleeding.  Likely due to known diagnosis of hemorrhoids in the setting of supratherapeutic        INR.  Currently stable, hemoglobin has slowly down trended.  -Will receive transfusion as mentioned above  -Has not had any further episodes during hospital stay, continue to monitor      Family Communication  : no family at bedside Code Status : Full code  Disposition Plan  : Continue IV [penicillin, needs TEE, close monitoring of blood pressure, creatinine/kidney function  Consults  : Cardiology, ID  Procedures  : TTE (12/9)  DVT Prophylaxis  : Heparin Lab Results  Component Value Date   PLT 216 03/20/2019    Diet :  Diet Order            Diet Heart Room service appropriate? Yes; Fluid consistency: Thin  Diet effective now               Inpatient Medications Scheduled Meds: . sodium chloride   Intravenous Once  . psyllium  1 packet Oral Daily   Continuous Infusions: . heparin 1,450 Units/hr (03/20/19 1221)  . penicillin g continuous IV infusion 8 Million Units (03/20/19 1216)   PRN  Meds:.acetaminophen, witch hazel-glycerin  Antibiotics  :   Anti-infectives (From admission, onward)   Start     Dose/Rate Route Frequency Ordered Stop   03/20/19 1200  penicillin G potassium 8 Million Units in dextrose 5 % 500 mL continuous infusion     8 Million Units 41.7 mL/hr over 12 Hours Intravenous Every 12 hours 03/20/19 0945     03/17/19 2200  cefTRIAXone (ROCEPHIN) 1 g in sodium chloride 0.9 % 100 mL IVPB  Status:  Discontinued     1 g 200 mL/hr over 30 Minutes Intravenous Every 24 hours 03/17/19 0240 03/17/19 1536   03/17/19 2200  cefTRIAXone (ROCEPHIN) 2 g in sodium chloride 0.9 % 100 mL IVPB  Status:  Discontinued     2 g 200 mL/hr over 30 Minutes Intravenous Every 24 hours 03/17/19 1536 03/20/19 0945   03/17/19 0130  cefTRIAXone (ROCEPHIN) 1 g in sodium chloride 0.9 % 100 mL IVPB     1 g 200 mL/hr over 30 Minutes Intravenous  Once 03/17/19 0123 03/17/19 0214       Objective   Vitals:   03/20/19 0037 03/20/19 0515 03/20/19 0743 03/20/19 1234  BP: (!) 105/56 (!) 113/49 (!) 118/52 (!) 110/52  Pulse: 61 61 68 60  Resp: 16 18 20 20   Temp: 98 F (36.7 C) 97.6 F (36.4 C) 97.7 F (36.5 C) 97.7 F (36.5 C)  TempSrc: Oral Oral Oral Oral  SpO2: 100% 96% 92% 100%  Weight:  (!) 158.5 kg    Height:        SpO2: 100 % O2 Flow Rate (L/min): 2 L/min  Wt Readings from Last 3 Encounters:  03/20/19 (!) 158.5 kg  02/28/19 (!) 150.1 kg  02/14/19 (!) 156 kg     Intake/Output Summary (Last 24 hours) at 03/20/2019 1444 Last data filed at 03/20/2019 1223 Gross per 24 hour  Intake 690.79 ml  Output 2075 ml  Net -1384.21 ml    Physical Exam:  Awake Alert, Oriented X 3, Normal affect No new F.N deficits,  [place].AT, No JVD appreciated Symmetrical Chest wall movement, Good air movement bilaterally, CTAB on room air RRR,systolic click and murmur heard +ve B.Sounds, Abd Soft, No tenderness, No organomegaly appreciated, No rebound, guarding or rigidity. Right leg 2+ edema  to above thigh, circumferential redness and warmth without tenderness on calf, left leg 1 + edema to above thigh           I have personally reviewed the following:   Data Reviewed:  CBC Recent Labs  Lab 03/16/19 2359 03/17/19 0743 03/18/19 0310 03/19/19 0220 03/19/19 2315 03/20/19 0430  WBC 29.8* 42.8* 36.1* 24.6*  --  15.9*  HGB 8.9* 8.3* 7.9* 7.5* 8.7* 8.5*  HCT 28.7* 27.0* 24.3* 23.3* 26.8* 26.0*  PLT 270 224 195 202  --  216  MCV 101.8* 103.1* 98.8 98.3  --  98.1  MCH 31.6 31.7 32.1 31.6  --  32.1  MCHC 31.0 30.7 32.5 32.2  --  32.7  RDW 17.0* 17.1* 17.0* 17.0*  --  17.6*  LYMPHSABS 0.7  --   --   --   --   --   MONOABS 2.0*  --   --   --   --   --   EOSABS 0.0  --   --   --   --   --   BASOSABS 0.1  --   --   --   --   --     Chemistries  Recent Labs  Lab 03/17/19 0743 03/18/19 0310 03/19/19 0220 03/19/19 0815 03/19/19 1610 03/19/19 2315 03/20/19 0430 03/20/19 0745 03/20/19 1140  NA 135 129* 128* 129*  --   --  130*  --   --   K 4.6 5.1 5.5* 5.2* 5.5* 5.1 4.8 4.8 5.4*  CL 97* 92* 92* 92*  --   --  93*  --   --   CO2 18* 25 23 21*  --   --  23  --   --   GLUCOSE 90 90 94 107*  --   --  87  --   --   BUN 61* 79* 92* 91*  --   --  90*  --   --   CREATININE 3.05* 3.53* 3.65* 3.51*  --   --  2.83*  --   --   CALCIUM 9.1 8.8* 8.7* 8.7*  --   --  8.7*  --   --    ------------------------------------------------------------------------------------------------------------------ No results for input(s):  CHOL, HDL, LDLCALC, TRIG, CHOLHDL, LDLDIRECT in the last 72 hours.  Lab Results  Component Value Date   HGBA1C 5.7 (H) 09/18/2017   ------------------------------------------------------------------------------------------------------------------ No results for input(s): TSH, T4TOTAL, T3FREE, THYROIDAB in the last 72 hours.  Invalid input(s):  FREET3 ------------------------------------------------------------------------------------------------------------------ No results for input(s): VITAMINB12, FOLATE, FERRITIN, TIBC, IRON, RETICCTPCT in the last 72 hours.  Coagulation profile Recent Labs  Lab 03/16/19 2359 03/17/19 0743 03/18/19 0310 03/19/19 0220 03/20/19 0430  INR 4.7* 5.2* 6.3* 2.7* 2.2*    No results for input(s): DDIMER in the last 72 hours.  Cardiac Enzymes No results for input(s): CKMB, TROPONINI, MYOGLOBIN in the last 168 hours.  Invalid input(s): CK ------------------------------------------------------------------------------------------------------------------    Component Value Date/Time   BNP 105.7 (H) 03/16/2019 2359   BNP 31.3 07/28/2015 0829    Micro Results Recent Results (from the past 240 hour(s))  Blood culture (routine x 2)     Status: Abnormal   Collection Time: 03/16/19 11:59 PM   Specimen: BLOOD  Result Value Ref Range Status   Specimen Description BLOOD RIGHT HAND  Final   Special Requests   Final    BOTTLES DRAWN AEROBIC AND ANAEROBIC Blood Culture results may not be optimal due to an inadequate volume of blood received in culture bottles   Culture  Setup Time   Final    GRAM POSITIVE COCCI IN CHAINS AEROBIC BOTTLE ONLY CRITICAL RESULT CALLED TO, READ BACK BY AND VERIFIED WITH: E. SINCLAIR, PHARMD AT 9562 ON 03/17/19 BY C. JESSUP, MT.    Culture (A)  Final    STREPTOCOCCUS GROUP G SUSCEPTIBILITIES PERFORMED ON PREVIOUS CULTURE WITHIN THE LAST 5 DAYS. Performed at [facility], [place] [address] [place], [place] [address]    Report Status 03/19/2019 FINAL  Final  Blood culture (routine x 2)     Status: Abnormal   Collection Time: 03/16/19 11:59 PM   Specimen: BLOOD  Result Value Ref Range Status   Specimen Description BLOOD LEFT HAND  Final   Special Requests   Final    BOTTLES DRAWN AEROBIC AND ANAEROBIC Blood Culture adequate volume   Culture  Setup Time   Final     GRAM POSITIVE COCCI IN CHAINS IN BOTH AEROBIC AND ANAEROBIC BOTTLES CRITICAL RESULT CALLED TO, READ BACK BY AND VERIFIED WITHBronwen Betters [facility] 5784 03/17/19 A BROWNING Performed at [facility], [place] [address], [place], [place] [address]    Culture STREPTOCOCCUS GROUP G (A)  Final   Report Status 03/19/2019 FINAL  Final   Organism ID, Bacteria STREPTOCOCCUS GROUP G  Final      Susceptibility   Streptococcus group g - MIC*    CLINDAMYCIN <=0.25 SENSITIVE Sensitive     AMPICILLIN <=0.25 SENSITIVE Sensitive     ERYTHROMYCIN <=0.12 SENSITIVE Sensitive     VANCOMYCIN 0.5 SENSITIVE Sensitive     CEFTRIAXONE <=0.12 SENSITIVE Sensitive     LEVOFLOXACIN 0.5 SENSITIVE Sensitive     PENICILLIN Value in next row Sensitive      SENSITIVE<=0.06    * STREPTOCOCCUS GROUP G  Blood Culture ID Panel (Reflexed)     Status: Abnormal   Collection Time: 03/16/19 11:59 PM  Result Value Ref Range Status   Enterococcus species NOT DETECTED NOT DETECTED Final   Listeria monocytogenes NOT DETECTED NOT DETECTED Final   Staphylococcus species NOT DETECTED NOT DETECTED Final   Staphylococcus aureus (BCID) NOT DETECTED NOT DETECTED Final   Streptococcus species DETECTED (A) NOT DETECTED Final  Comment: Not Enterococcus species, Streptococcus agalactiae, Streptococcus pyogenes, or Streptococcus pneumoniae. CRITICAL RESULT CALLED TO, READ BACK BY AND VERIFIED WITH: Bronwen Betters PHARMD [id number] 03/17/19 A BROWNING    Streptococcus agalactiae NOT DETECTED NOT DETECTED Final   Streptococcus pneumoniae NOT DETECTED NOT DETECTED Final   Streptococcus pyogenes NOT DETECTED NOT DETECTED Final   Acinetobacter baumannii NOT DETECTED NOT DETECTED Final   Enterobacteriaceae species NOT DETECTED NOT DETECTED Final   Enterobacter cloacae complex NOT DETECTED NOT DETECTED Final   Escherichia coli NOT DETECTED NOT DETECTED Final   Klebsiella oxytoca NOT DETECTED NOT DETECTED Final   Klebsiella pneumoniae NOT DETECTED NOT  DETECTED Final   Proteus species NOT DETECTED NOT DETECTED Final   Serratia marcescens NOT DETECTED NOT DETECTED Final   Haemophilus influenzae NOT DETECTED NOT DETECTED Final   Neisseria meningitidis NOT DETECTED NOT DETECTED Final   Pseudomonas aeruginosa NOT DETECTED NOT DETECTED Final   Candida albicans NOT DETECTED NOT DETECTED Final   Candida glabrata NOT DETECTED NOT DETECTED Final   Candida krusei NOT DETECTED NOT DETECTED Final   Candida parapsilosis NOT DETECTED NOT DETECTED Final   Candida tropicalis NOT DETECTED NOT DETECTED Final    Comment: Performed at [facility], [place]. [address], [place], [place] [address]  SARS CORONAVIRUS 2 (TAT 6-24 HRS) Nasopharyngeal Nasopharyngeal Swab     Status: None   Collection Time: 03/17/19 12:32 AM   Specimen: Nasopharyngeal Swab  Result Value Ref Range Status   SARS Coronavirus 2 NEGATIVE NEGATIVE Final    Comment: (NOTE) SARS-CoV-2 target nucleic acids are NOT DETECTED. The SARS-CoV-2 RNA is generally detectable in upper and lower respiratory specimens during the acute phase of infection. Negative results do not preclude SARS-CoV-2 infection, do not rule out co-infections with other pathogens, and should not be used as the sole basis for treatment or other patient management decisions. Negative results must be combined with clinical observations, patient history, and epidemiological information. The expected result is Negative. Fact Sheet for Patients: SugarRoll.be Fact Sheet for Healthcare Providers: https://www.woods-mathews.com/ This test is not yet approved or cleared by the [place] FDA and  has been authorized for detection and/or diagnosis of SARS-CoV-2 by FDA under an Emergency Use Authorization (EUA). This EUA will remain  in effect (meaning this test can be used) for the duration of the COVID-19 declaration under Section 56 4(b)(1) of the Act, 21 U.S.C. section  360bbb-3(b)(1), unless the authorization is terminated or revoked sooner. Performed at [facility], [place] [address], [place], [place] [address]   Urine culture     Status: Abnormal   Collection Time: 03/17/19 12:54 AM   Specimen: Urine, Random  Result Value Ref Range Status   Specimen Description URINE, RANDOM  Final   Special Requests   Final    NONE Performed at [facility], [place] [address] [place], [place] [address]    Culture MULTIPLE SPECIES PRESENT, SUGGEST RECOLLECTION (A)  Final   Report Status 03/18/2019 FINAL  Final  Culture, blood (Routine X 2) w Reflex to ID Panel     Status: None (Preliminary result)   Collection Time: 03/19/19  4:02 PM   Specimen: BLOOD RIGHT HAND  Result Value Ref Range Status   Specimen Description BLOOD RIGHT HAND  Final   Special Requests   Final    BOTTLES DRAWN AEROBIC AND ANAEROBIC Blood Culture results may not be optimal due to an inadequate volume of blood received in culture bottles   Culture  Final    NO GROWTH < 24 HOURS Performed at [facility], [place] [address], [place], [place] [address]    Report Status PENDING  Incomplete  Culture, blood (Routine X 2) w Reflex to ID Panel     Status: None (Preliminary result)   Collection Time: 03/19/19  4:10 PM   Specimen: BLOOD RIGHT HAND  Result Value Ref Range Status   Specimen Description BLOOD RIGHT HAND  Final   Special Requests   Final    BOTTLES DRAWN AEROBIC AND ANAEROBIC Blood Culture results may not be optimal due to an inadequate volume of blood received in culture bottles   Culture   Final    NO GROWTH < 24 HOURS Performed at [facility], [place] [address] [place], [place] [address]    Report Status PENDING  Incomplete    Radiology Reports DG Chest Port 1 View  Result Date: 03/16/2019 CLINICAL DATA:  Shortness of breath EXAM: PORTABLE CHEST 1 VIEW COMPARISON:  July 10, 2018 FINDINGS: The heart size is significantly enlarged. The patient is status post  prior median sternotomy. Aortic calcifications are noted. There is a multi lead right-sided pacemaker in place. There is vascular congestion without overt pulmonary edema. There is no pneumothorax or large pleural effusion. There is no acute osseous abnormality. IMPRESSION: Cardiomegaly with mild volume overload. Electronically Signed   By: Constance Holster M.D.   On: 03/16/2019 23:50   ECHOCARDIOGRAM COMPLETE  Result Date: 03/19/2019   ECHOCARDIOGRAM REPORT   Patient Name:   DONDI AIME Alvillar Date of Exam: 03/19/2019 Medical Rec #:  [id number]       Height:       71.0 in Accession #:    [id number]      Weight:       345.7 lb Date of Birth:  [birth year]      BSA:          2.66 m Patient Age:    35 years        BP:           99/43 mmHg Patient Gender: M               HR:           60 bpm. Exam Location:  Inpatient Procedure: 2D Echo, Cardiac Doppler, Color Doppler and Intracardiac            Opacification Agent                            MODIFIED REPORT: This report was modified by Eleonore Chiquito MD on 03/19/2019 due to error.  Indications:     Bacteremia.  History:         Patient has prior history of Echocardiogram examinations, most                  recent 02/14/2019. Non- ischemic cardiomyopathy and CHF,                  Pacemaker and Abnormal ECG, TIA and Pulmonary HTN, Mitral Valve                  Disease, Arrythmias:Complete heart block; Risk                  Factors:Hypertension. Mitral Valve: mechanical valve is present                  in the  mitral position.  Sonographer:     Roseanna Rainbow RDCS Referring Phys:  [id number] [name] Diagnosing Phys: Eleonore Chiquito MD  Sonographer Comments: Technically difficult study due to poor echo windows, Technically challenging study due to limited acoustic windows, patient is morbidly obese, suboptimal subcostal window, suboptimal apical window and suboptimal parasternal window.  Image acquisition challenging due to patient body habitus. IMPRESSIONS  1. No evidence of  infective endocarditis on this study. A TEE is recommended to exclude endocarditis.  2. The patient is s/p myomectomy with mechanical MVR and AoV repair (reported in record). Overall, the EF remains unchanged with prior. The mechanical MV prosthesis is functioning normally. The AoV is severely calcified with restricted movement. I suspect there is at least mild to moderate aortic stenosis, but doppler interrogation on this study is suboptimal. The tricuspid regurgitation on this study is severe with systolic hepatic vein flow reversal (not well evaluated on last study). Findings compared directly with study from 02/14/2019.  3. Left ventricular ejection fraction, by visual estimation, is 55 to 60%. The left ventricle has normal function. There is mildly increased left ventricular hypertrophy.  4. Definity contrast agent was given IV to delineate the left ventricular endocardial borders.  5. Left ventricular diastolic function could not be evaluated.  6. Right ventricular volume overload.  7. The left ventricle has no regional wall motion abnormalities.  8. Global right ventricle has severely reduced systolic function.The right ventricular size is severely enlarged. No increase in right ventricular wall thickness.  9. Left atrial size was mild-moderately dilated. 10. Right atrial size was severely dilated. 11. The mitral valve has been repaired/replaced. No evidence of mitral valve regurgitation. 12. Mechanical mitral valve present. Mean gradient 5 mmHG with heart rate 60 bpm. EOA 5 mmHG. Normal functioning prosthetic valve with stable gradients compared with prior. 13. The tricuspid valve is grossly normal. Tricuspid valve regurgitation is severe. 14. Aortic valve regurgitation is mild. Mild to moderate aortic valve stenosis. 15. The aortic valve is severely calcified with restricted leaflet motion. Doppler interrogation is poor on this study. Vmax 2.5 m/s, Mean gradient ~8.5 mmHG, but suspect this is severely  underestimated. There is likely a mild to moderate degree of aortic stenosis present. 16. The pulmonic valve was grossly normal. Pulmonic valve regurgitation is not visualized. 17. Mild plaque invoving the aortic root. 18. Normal pulmonary artery systolic pressure. 19. The tricuspid regurgitant velocity is 1.76 m/s, and with an assumed right atrial pressure of 15 mmHg, the estimated right ventricular systolic pressure is normal at 27.4 mmHg. 20. A pacer wire is visualized in the RA and RV. FINDINGS  Left Ventricle: Left ventricular ejection fraction, by visual estimation, is 55 to 60%. The left ventricle has normal function. Definity contrast agent was given IV to delineate the left ventricular endocardial borders. The left ventricle has no regional wall motion abnormalities. There is mildly increased left ventricular hypertrophy. Concentric left ventricular hypertrophy. The interventricular septum is flattened in diastole ('D' shaped left ventricle), consistent with right ventricular volume overload. The left ventricular diastology could not be evaluated due to mitral valve replacement/repair. Left ventricular diastolic function could not be evaluated. Right Ventricle: The right ventricular size is severely enlarged. No increase in right ventricular wall thickness. Global RV systolic function is has severely reduced systolic function. The tricuspid regurgitant velocity is 1.76 m/s, and with an assumed right atrial pressure of 15 mmHg, the estimated right ventricular systolic pressure is normal at 27.4 mmHg. Left Atrium: Left atrial size was mild-moderately  dilated. Right Atrium: Right atrial size was severely dilated Pericardium: There is no evidence of pericardial effusion. Mitral Valve: The mitral valve has been repaired/replaced. No evidence of mitral valve regurgitation. MV peak gradient, 19.1 mmHg. Mechanical mitral valve present. Mean gradient 5 mmHG with heart rate 60 bpm. EOA 5 mmHG. Normal functioning  prosthetic valve with stable gradients compared with prior. Tricuspid Valve: The tricuspid valve is grossly normal. Tricuspid valve regurgitation is severe. The flow in the hepatic veins is reversed during ventricular systole. Aortic Valve: The aortic valve has been repaired/replaced. Aortic valve regurgitation is mild. Mild to moderate aortic stenosis is present. Aortic valve mean gradient measures 8.5 mmHg. Aortic valve peak gradient measures 16.2 mmHg. Aortic valve area, by  VTI measures 2.40 cm. The aortic valve is severely calcified with restricted leaflet motion. Doppler interrogation is poor on this study. Vmax 2.5 m/s, Mean gradient ~8.5 mmHG, but suspect this is severely underestimated. There is likely a mild to moderate degree of aortic stenosis present. Pulmonic Valve: The pulmonic valve was grossly normal. Pulmonic valve regurgitation is not visualized. Pulmonic regurgitation is not visualized. Aorta: The aortic root is normal in size and structure. There is mild, layered plaque involving the aortic root. IAS/Shunts: No atrial level shunt detected by color flow Doppler. Additional Comments: A pacer wire is visualized in the right atrium and right ventricle.  LEFT VENTRICLE PLAX 2D LVIDd:         4.10 cm       Diastology LVIDs:         3.00 cm       LV e' lateral:   11.70 cm/s LV PW:         2.10 cm       LV E/e' lateral: 12.3 LV IVS:        1.70 cm       LV e' medial:    8.59 cm/s LVOT diam:     2.20 cm       LV E/e' medial:  16.8 LV SV:         39 ml LV SV Index:   13.60 LVOT Area:     3.80 cm  LV Volumes (MOD) LV area d, A2C:    33.90 cm LV area d, A4C:    38.70 cm LV area s, A2C:    23.20 cm LV area s, A4C:    25.93 cm LV major d, A2C:   7.96 cm LV major d, A4C:   8.64 cm LV major s, A2C:   7.74 cm LV major s, A4C:   8.04 cm LV vol d, MOD A2C: 120.0 ml LV vol d, MOD A4C: 147.4 ml LV vol s, MOD A2C: 58.2 ml LV vol s, MOD A4C: 71.6 ml LV SV MOD A2C:     61.8 ml LV SV MOD A4C:     147.4 ml LV SV  MOD BP:      71.1 ml RIGHT VENTRICLE            IVC RV S prime:     8.81 cm/s  IVC diam: 4.70 cm TAPSE (M-mode): 1.5 cm LEFT ATRIUM            Index       RIGHT ATRIUM           Index LA diam:      6.40 cm  2.41 cm/m  RA Area:     42.00 cm LA Vol (A2C): 108.0 ml 40.59 ml/m RA Volume:  177.00 ml 66.52 ml/m LA Vol (A4C): 147.0 ml 55.25 ml/m  AORTIC VALVE AV Area (Vmax):    2.51 cm AV Area (Vmean):   2.58 cm AV Area (VTI):     2.40 cm AV Vmax:           201.50 cm/s AV Vmean:          132.000 cm/s AV VTI:            0.419 m AV Peak Grad:      16.2 mmHg AV Mean Grad:      8.5 mmHg LVOT Vmax:         133.00 cm/s LVOT Vmean:        89.600 cm/s LVOT VTI:          0.264 m LVOT/AV VTI ratio: 0.63  AORTA Ao Root diam: 3.70 cm Ao Asc diam:  3.40 cm MITRAL VALVE                        TRICUSPID VALVE MV Area (PHT): 2.48 cm             TV Peak grad:   17.1 mmHg MV Peak grad:  19.1 mmHg            TV Mean grad:   5.0 mmHg MV Mean grad:  5.0 mmHg             TV Vmax:        2.07 m/s MV Vmax:       2.18 m/s             TV Vmean:       93.2 cm/s MV Vmean:      93.5 cm/s            TV VTI:         0.61 msec MV VTI:        0.54 m               TR Peak grad:   12.4 mmHg MV PHT:        88.74 msec           TR Vmax:        178.00 cm/s MV Decel Time: 306 msec MV E velocity: 144.00 cm/s 103 cm/s SHUNTS                                     Systemic VTI:  0.26 m                                     Systemic Diam: 2.20 cm  Eleonore Chiquito MD Electronically signed by Eleonore Chiquito MD Signature Date/Time: 03/19/2019/11:50:41 AM    Final (Updated)    CUP PACEART REMOTE DEVICE CHECK  Result Date: 03/06/2019 Scheduled remote reviewed.  Normal device function.  No atrial arrhythmia detected since last remote on 02/25/2019 Next remote 91 days. Kathy Breach, RN, CCDS, [employer]  VAS [name] LOWER EXTREMITY VENOUS (DVT)  Result Date: 03/17/2019  Lower Venous Study Indications: Swelling, and Edema.  Limitations: Body habitus and poor  ultrasound/tissue interface. Comparison Study: no prior Performing Technologist: Abram Sander RVS  Examination Guidelines: A complete evaluation includes B-mode imaging, spectral Doppler, color Doppler, and power Doppler as needed of all accessible portions of each  vessel. Bilateral testing is considered an integral part of a complete examination. Limited examinations for reoccurring indications may be performed as noted.  +---------+---------------+---------+-----------+----------+--------------+ RIGHT    CompressibilityPhasicitySpontaneityPropertiesThrombus Aging +---------+---------------+---------+-----------+----------+--------------+ CFV      Full           Yes      Yes                                 +---------+---------------+---------+-----------+----------+--------------+ SFJ      Full                                                        +---------+---------------+---------+-----------+----------+--------------+ FV Prox  Full                                                        +---------+---------------+---------+-----------+----------+--------------+ FV Mid                  Yes      Yes                                 +---------+---------------+---------+-----------+----------+--------------+ FV Distal               Yes      Yes                                 +---------+---------------+---------+-----------+----------+--------------+ PFV      Full                                                        +---------+---------------+---------+-----------+----------+--------------+ POP      Full           Yes      Yes                                 +---------+---------------+---------+-----------+----------+--------------+ PTV                                                   Not visualized +---------+---------------+---------+-----------+----------+--------------+ PERO                                                  Not visualized  +---------+---------------+---------+-----------+----------+--------------+   +----+---------------+---------+-----------+----------+--------------+ LEFTCompressibilityPhasicitySpontaneityPropertiesThrombus Aging +----+---------------+---------+-----------+----------+--------------+ CFV                Yes      Yes                                 +----+---------------+---------+-----------+----------+--------------+  Summary: Right: There is no evidence of deep vein thrombosis in the lower extremity. However, portions of this examination were limited- see technologist comments above. No cystic structure found in the popliteal fossa. Left: No evidence of common femoral vein obstruction.  *See table(s) above for measurements and observations. Electronically signed by Servando Snare MD on 03/17/2019 at 5:03:22 PM.    Final      Time Spent in minutes  30     Desiree Hane M.D on 03/20/2019 at 2:44 PM  To page go to [email or web address] - password [facility]

## 2019-03-20 NOTE — Progress Notes (Signed)
ANTICOAGULATION CONSULT NOTE - Follow Up Consult  Pharmacy Consult for Heparin; Coumadin (on hold) Indication: mechanical MVR (1999) and atrial fibrillation  Allergies  Allergen Reactions  . Warfarin And Related Other (See Comments)    COUMADIN or JANTOVEN BRAND ONLY** Per pt, was switched to generic in 2001 and had significantly decreased absorption > INR subtherapeutic> pt suffered a TIA and has been on brand name only since     Patient Measurements: Height: 5\' 11"  (180.3 cm) Weight: (!) 349 lb 6.9 oz (158.5 kg) IBW/kg (Calculated) : 75.3 Heparin Dosing Weight: 111 kg  Vital Signs: Temp: 97.6 F (36.4 C) (12/10 1746) Temp Source: Oral (12/10 1746) BP: 114/48 (12/10 1746) Pulse Rate: 66 (12/10 1746)  Labs: Recent Labs    03/18/19 0310 03/19/19 0220 03/19/19 0815 03/19/19 2315 03/20/19 0430 03/20/19 0745 03/20/19 1811  HGB 7.9* 7.5*  --  8.7* 8.5*  --   --   HCT 24.3* 23.3*  --  26.8* 26.0*  --   --   PLT 195 202  --   --  216  --   --   LABPROT 55.9* 28.5*  --   --  24.5*  --   --   INR 6.3* 2.7*  --   --  2.2*  --   --   HEPARINUNFRC  --   --   --   --   --  <0.10* <0.10*  CREATININE 3.53* 3.65* 3.51*  --  2.83*  --   --     Estimated Creatinine Clearance: 37.3 mL/min (A) (by C-G formula based on SCr of 2.83 mg/dL (H)).  Assessment:  73 yo M on Coumadin for mechanical MVR 1999 and Afib admitted with melena and INR of 5.2> inc 6.3 despite holding warfarin doses. Vitamin K 2 mg IV given on 12/8 >  INR 2.7  - Pharmacy was consulted to dose warfarin, with plans to continue PTA BRAND COUMADIN. Currently on hold for TEE on 12/11.  IV heparin begun 12/9 pm.  Heparin level is undetectable (<0.10) on 1450 units/hr.  INR down to 2.2.  No known infusion problems.  Hgb up to 8.5 after 1 unit PRBCs on 12/9.  Intermittent rectal bleeding as PTA. Patient reports thought due to internal and external hemorrhoids, per colonoscopy about 3 weeks ago.   Of note, patient takes COUMADIN  or JANTOVEN BRAND ONLY** Per pt, was switched to generic in 2001 and had significantly decreased absorption > INR subtherapeutic> pt suffered a TIA and has been on brand name only since then. Wife brought in home supply since we only have generic warfarin.    Home Coumadin: 15 mg Sun/Wed, 10 all other days (stable dose since 2019). Per patient, has been drinking vinegar and eating more picked vegetables, but otherwise no change in diet.  Goal of Therapy:  Heparin level 0.3-0.7 units/ml INR 2.5 - 3.5 Monitor platelets by anticoagulation protocol: Yes   Plan:  Increase heparin drip to 1700 units/hr Heparin level, Protime and CBC daily  Coumadin on hold.  Home supply of name brand stored in Rx.  [name] Pharm.D. CPP, BCPS Clinical Pharmacist [phone] 03/20/2019 7:25 PM

## 2019-03-21 ENCOUNTER — Inpatient Hospital Stay (HOSPITAL_COMMUNITY): Payer: Medicare Other | Admitting: Certified Registered"

## 2019-03-21 ENCOUNTER — Inpatient Hospital Stay (HOSPITAL_COMMUNITY): Payer: Medicare Other

## 2019-03-21 ENCOUNTER — Encounter (HOSPITAL_COMMUNITY)
Admission: EM | Disposition: A | Discharge status: Home Health | Payer: Self-pay | Source: Home / Self Care | Attending: Internal Medicine

## 2019-03-21 DIAGNOSIS — R7881 Bacteremia: Secondary | ICD-10-CM

## 2019-03-21 DIAGNOSIS — D689 Coagulation defect, unspecified: Secondary | ICD-10-CM

## 2019-03-21 DIAGNOSIS — I442 Atrioventricular block, complete: Secondary | ICD-10-CM

## 2019-03-21 DIAGNOSIS — I351 Nonrheumatic aortic (valve) insufficiency: Secondary | ICD-10-CM

## 2019-03-21 HISTORY — PX: TEE WITHOUT CARDIOVERSION: SHX5443

## 2019-03-21 LAB — BASIC METABOLIC PANEL
Anion gap: 14 (ref 5–15)
BUN: 87 mg/dL — ABNORMAL HIGH (ref 8–23)
CO2: 23 mmol/L (ref 22–32)
Calcium: 8.8 mg/dL — ABNORMAL LOW (ref 8.9–10.3)
Chloride: 95 mmol/L — ABNORMAL LOW (ref 98–111)
Creatinine, Ser: 2.33 mg/dL — ABNORMAL HIGH (ref 0.61–1.24)
GFR calc Af Amer: 32 mL/min — ABNORMAL LOW (ref >60)
GFR calc non Af Amer: 27 mL/min — ABNORMAL LOW (ref >60)
Glucose, Bld: 91 mg/dL (ref 70–99)
Potassium: 3.8 mmol/L (ref 3.5–5.1)
Sodium: 132 mmol/L — ABNORMAL LOW (ref 135–145)

## 2019-03-21 LAB — CBC
HCT: 25.2 % — ABNORMAL LOW (ref 39.0–52.0)
Hemoglobin: 8.3 g/dL — ABNORMAL LOW (ref 13.0–17.0)
MCH: 31.8 pg (ref 26.0–34.0)
MCHC: 32.9 g/dL (ref 30.0–36.0)
MCV: 96.6 fL (ref 80.0–100.0)
Platelets: 207 10*3/uL (ref 150–400)
RBC: 2.61 MIL/uL — ABNORMAL LOW (ref 4.22–5.81)
RDW: 17.2 % — ABNORMAL HIGH (ref 11.5–15.5)
WBC: 12.6 10*3/uL — ABNORMAL HIGH (ref 4.0–10.5)
nRBC: 0.4 % — ABNORMAL HIGH (ref 0.0–0.2)

## 2019-03-21 LAB — PROTIME-INR
INR: 2.3 — ABNORMAL HIGH (ref 0.8–1.2)
Prothrombin Time: 24.8 seconds — ABNORMAL HIGH (ref 11.4–15.2)

## 2019-03-21 LAB — HEPARIN LEVEL (UNFRACTIONATED)
Heparin Unfractionated: 0.22 IU/mL — ABNORMAL LOW (ref 0.30–0.70)
Heparin Unfractionated: 0.44 IU/mL (ref 0.30–0.70)

## 2019-03-21 SURGERY — ECHOCARDIOGRAM, TRANSESOPHAGEAL
Anesthesia: Monitor Anesthesia Care

## 2019-03-21 MED ORDER — WARFARIN SODIUM 10 MG PO TABS
10.0000 mg | ORAL_TABLET | Freq: Once | ORAL | Status: AC
  Administered 2019-03-21: 10 mg via ORAL
  Filled 2019-03-21: qty 1

## 2019-03-21 MED ORDER — PROPOFOL 500 MG/50ML IV EMUL
INTRAVENOUS | Status: DC | PRN
  Administered 2019-03-21: 50 ug/kg/min via INTRAVENOUS

## 2019-03-21 MED ORDER — WARFARIN - PHARMACIST DOSING INPATIENT
Freq: Every day | Status: DC
  Administered 2019-03-23: 19:00:00

## 2019-03-21 MED ORDER — SODIUM CHLORIDE 0.9 % IV SOLN
INTRAVENOUS | Status: DC
  Administered 2019-03-21: 13:00:00 via INTRAVENOUS

## 2019-03-21 MED ORDER — ONDANSETRON HCL 4 MG/2ML IJ SOLN
INTRAMUSCULAR | Status: DC | PRN
  Administered 2019-03-21: 4 mg via INTRAVENOUS

## 2019-03-21 MED ORDER — SODIUM CHLORIDE 0.9 % IV SOLN
INTRAVENOUS | Status: DC | PRN
  Administered 2019-03-21 (×3): 80 ug via INTRAVENOUS

## 2019-03-21 MED ORDER — LIDOCAINE HCL (CARDIAC) PF 100 MG/5ML IV SOSY
PREFILLED_SYRINGE | INTRAVENOUS | Status: DC | PRN
  Administered 2019-03-21: 20 mg via INTRAVENOUS

## 2019-03-21 NOTE — Progress Notes (Addendum)
OT Cancellation Note  Patient Details Name: Frank [name] MRN: [id number] DOB: 05-Mar-1946   Cancelled Treatment:    Reason Eval/Treat Not Completed: Fatigue/lethargy limiting ability to participate;Other (comment) Pt reports just getting back from TEE with wife present. Spoke to pt and wife briefly about home set- up with pt politely declining session as lunch tray had just arrived and wife was getting ready to leave. Will check back as time allows for OT session.   Lanier Clam., COTA/L Acute Rehabilitation Services [phone] [place] 03/21/2019, 3:50 PM

## 2019-03-21 NOTE — Progress Notes (Signed)
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 Found with sepsis from UTI and possible cellulitis of R LE, CHF.    PT Comments    Patient progressing well towards PT goals. Improved ambulation distance with min guard assist for balance/safety and cues for safe RW management. VSS throughout. 2/4 DOE. Reports feeling stronger today. Continues to report RLE pain distal to knee. Will continue to follow. Sitting EOB BP pre activity 126/62 Sitting BP post activity 141/82    Follow Up Recommendations  Home health PT;Supervision - Intermittent     Equipment Recommendations  Rolling walker with 5" wheels(bari)    Recommendations for Other Services       Precautions / Restrictions Precautions Precautions: Fall Restrictions Weight Bearing Restrictions: No    Mobility  Bed Mobility               General bed mobility comments: Sitting EOB upon PT arrival.  Transfers Overall transfer level: Needs assistance Equipment used: Rolling walker (2 wheeled) Transfers: Sit to/from Stand Sit to Stand: Supervision         General transfer comment: Supervision for safety. stood from EOB x1, transferred to chair post ambulation.  Ambulation/Gait Ambulation/Gait assistance: Min guard Gait Distance (Feet): 150 Feet Assistive device: Rolling walker (2 wheeled) Gait Pattern/deviations: Step-through pattern;Decreased stride length;Trunk flexed Gait velocity: reduced   General Gait Details: Slow, mildly unsteady gait with cues for RW proximity. 2/4 DOE. VSS.   Stairs              Wheelchair Mobility    Modified Rankin (Stroke Patients Only)       Balance Overall balance assessment: Needs assistance Sitting-balance support: No upper extremity supported;Feet supported Sitting balance-Leahy Scale: Good     Standing balance support: During functional activity Standing balance-Leahy Scale: Fair Standing balance comment: supervision for static standing, able to stand with 1 UE support to urinate.                            Cognition Arousal/Alertness: Awake/alert Behavior During Therapy: WFL for tasks assessed/performed Overall Cognitive Status: Within Functional Limits for tasks assessed                                        Exercises      General Comments General comments (skin integrity, edema, etc.): Pt on RA. VSS throughout.      Pertinent Vitals/Pain Pain Assessment: Faces Faces Pain Scale: Hurts even more Pain Location: RLE when touched Pain Descriptors / Indicators: Sore;Tender Pain Intervention(s): Repositioned;Monitored during session    Home Living                      Prior Function            PT Goals (current goals can now be found in the care plan section) Progress towards PT goals: Progressing toward goals    Frequency    Min 3X/week      PT Plan Current plan remains appropriate  Co-evaluation              AM-PAC PT "6 Clicks" Mobility   Outcome Measure  Help needed turning from your back to your side while in a flat bed without using bedrails?: A Little Help needed moving from lying on your back to sitting on the side of a flat bed without using bedrails?: A Little Help needed moving to and from a bed to a chair (including a wheelchair)?: A Little Help needed standing up from a chair using your arms (e.g., wheelchair or bedside chair)?: None Help needed to walk in hospital room?: A Little Help needed climbing 3-5 steps with a railing? : A Lot 6 Click Score:  18    End of Session Equipment Utilized During Treatment: Gait belt Activity Tolerance: Patient tolerated treatment well Patient left: in chair;with call bell/phone within reach;with chair alarm set Nurse Communication: Mobility status PT Visit Diagnosis: Muscle weakness (generalized) (M62.81);Difficulty in walking, not elsewhere classified (R26.2);Pain Pain - Right/Left: Right Pain - part of body: Leg     Time: 0957-1020 PT Time Calculation (min) (ACUTE ONLY): 23 min  Charges:  $Gait Training: 8-22 mins $Therapeutic Activity: 8-22 mins                     Marisa Severin, PT, DPT Acute Rehabilitation Services Pager [phone] Office [place] 03/21/2019, 10:55 AM

## 2019-03-21 NOTE — CV Procedure (Signed)
   Transesophageal Echocardiogram  Indications: Bacteremia  Time out performed Propofol with anesthesia  Findings:  Left Ventricle: 55-60% EF  Mitral Valve: Mechanical St. Jude, normal function, no abscess, no rocking motion  Aortic Valve: Sclerotic valve, Mild aortic regurgitation  Tricuspid Valve: Severe TR  Left Atrium: Normal, smoke noted  RA/RV: Pacer wires noted, no vegetation.   IMPRESSION: No endocarditis. Normal mechanical mitral valve. No vegetations.   Candee Furbish, MD

## 2019-03-21 NOTE — Anesthesia Postprocedure Evaluation (Signed)
Anesthesia Post Note  Patient: Bion A Fedie  Procedure(s) Performed: TRANSESOPHAGEAL ECHOCARDIOGRAM (TEE) (N/A )     Patient location during evaluation: Endoscopy Anesthesia Type: MAC Level of consciousness: awake and alert Pain management: pain level controlled Vital Signs Assessment: post-procedure vital signs reviewed and stable Respiratory status: spontaneous breathing, nonlabored ventilation and respiratory function stable Cardiovascular status: blood pressure returned to baseline and stable Postop Assessment: no apparent nausea or vomiting Anesthetic complications: no    Last Vitals:  Vitals:   03/21/19 1346 03/21/19 1354  BP: (!) 111/48 (!) 111/45  Pulse: 65 62  Resp: 14 12  Temp:    SpO2: 95% 94%    Last Pain:  Vitals:   03/21/19 1354  TempSrc:   PainSc: 0-No pain                 Lidia Collum

## 2019-03-21 NOTE — Progress Notes (Addendum)
ANTICOAGULATION CONSULT NOTE - Follow Up Consult  Pharmacy Consult for Heparin; Coumadin  Indication: mechanical MVR (1999) and atrial fibrillation  Allergies  Allergen Reactions  . Warfarin And Related Other (See Comments)    COUMADIN or JANTOVEN BRAND ONLY** Per pt, was switched to generic in 2001 and had significantly decreased absorption > INR subtherapeutic> pt suffered a TIA and has been on brand name only since     Patient Measurements: Height: 5\' 11"  (180.3 cm) Weight: (!) 341 lb 8 oz (154.9 kg) IBW/kg (Calculated) : 75.3 Heparin Dosing Weight: 111 kg  Vital Signs: Temp: 97.6 F (36.4 C) (12/11 1342) Temp Source: Axillary (12/11 1342) BP: 111/45 (12/11 1354) Pulse Rate: 62 (12/11 1354)  Labs: Recent Labs    03/19/19 0220 03/19/19 0815 03/19/19 2315 03/19/19 2315 03/20/19 0430 03/20/19 1811 03/21/19 0250 03/21/19 1531  HGB 7.5*  --  8.7*  --  8.5*  --  8.3*  --   HCT 23.3*  --  26.8*  --  26.0*  --  25.2*  --   PLT 202  --   --   --  216  --  207  --   LABPROT 28.5*  --   --   --  24.5*  --  24.8*  --   INR 2.7*  --   --   --  2.2*  --  2.3*  --   HEPARINUNFRC  --   --   --    < >  --  <0.10* 0.22* 0.44  CREATININE 3.65* 3.51*  --   --  2.83*  --  2.33*  --    < > = values in this interval not displayed.    Estimated Creatinine Clearance: 44.7 mL/min (A) (by C-G formula based on SCr of 2.33 mg/dL (H)).  Assessment:  73 yo M on Coumadin for mechanical MVR 1999 and Afib admitted with melena and INR of 5.2> inc 6.3 despite holding warfarin doses. Vitamin K 2 mg IV given on 12/8 >  INR 2.7  - Pharmacy was consulted to dose warfarin, with plans to continue PTA BRAND COUMADIN. Currently on hold for TEE on 12/11.  IV heparin begun 12/9 pm.    Heparin level is now therapeutic (0.44) on 1850 units/hr. INR 2.3.   Coumadin to resume tonight.   Of note, patient takes COUMADIN or JANTOVEN BRAND ONLY** Per pt, was switched to generic in 2001 and had significantly  decreased absorption > INR subtherapeutic> pt suffered a TIA and has been on brand name only since then. Wife brought in home supply since we only have generic warfarin.     Home Coumadin: 15 mg Sun/Wed, 10 all other days (stable dose since 2019).    Intermittent rectal bleeding as PTA. Patient reports thought due to internal and external hemorrhoids, per colonoscopy about 3 weeks ago.   Goal of Therapy:  Heparin level 0.3-0.7 units/ml INR 2.5 - 3.5 Monitor platelets by anticoagulation protocol: Yes   Plan:  Continue heparin drip at 1900 units/hr Coumadin 10 mg x 1 tonight. Discussed with patient and wife. Home supply of name brand Coumadin stored in Rx. Daily heparin level, PT/INR and CBC.  [name], [place] Pager: [phone] or phone: [phone] 03/21/2019,4:53 PM

## 2019-03-21 NOTE — Interval H&P Note (Signed)
History and Physical Interval Note:  03/21/2019 12:20 PM  Frank [name]  has presented today for surgery, with the diagnosis of BACTEREMIA.  The various methods of treatment have been discussed with the patient and family. After consideration of risks, benefits and other options for treatment, the patient has consented to  Procedure(s): TRANSESOPHAGEAL ECHOCARDIOGRAM (TEE) (N/A) as a surgical intervention.  The patient's history has been reviewed, patient examined, no change in status, stable for surgery.  I have reviewed the patient's chart and labs.  Questions were answered to the patient's satisfaction.     [employer]

## 2019-03-21 NOTE — Anesthesia Preprocedure Evaluation (Signed)
Anesthesia Evaluation  Patient identified by MRN, date of birth, ID band Patient awake    Reviewed: Allergy & Precautions, NPO status , Patient's Chart, lab work & pertinent test results  History of Anesthesia Complications Negative for: history of anesthetic complications  Airway Mallampati: II  TM Distance: >3 FB Neck ROM: Full    Dental  (+) Teeth Intact   Pulmonary sleep apnea , former smoker,    Pulmonary exam normal        Cardiovascular hypertension, +CHF  Normal cardiovascular exam+ dysrhythmias Atrial Fibrillation + pacemaker + Valvular Problems/Murmurs AS and MR      Neuro/Psych TIAnegative psych ROS   GI/Hepatic negative GI ROS, Neg liver ROS,   Endo/Other  Morbid obesity  Renal/GU Renal disease  negative genitourinary   Musculoskeletal negative musculoskeletal ROS (+)   Abdominal   Peds  Hematology negative hematology ROS (+)   Anesthesia Other Findings   Reproductive/Obstetrics                            Anesthesia Physical Anesthesia Plan  ASA: III  Anesthesia Plan: MAC   Post-op Pain Management:    Induction: Intravenous  PONV Risk Score and Plan: 1 and Propofol infusion, TIVA and Treatment may vary due to age or medical condition  Airway Management Planned: Natural Airway, Nasal Cannula and Simple Face Mask  Additional Equipment: None  Intra-op Plan:   Post-operative Plan:   Informed Consent: I have reviewed the patients History and Physical, chart, labs and discussed the procedure including the risks, benefits and alternatives for the proposed anesthesia with the patient or authorized representative who has indicated his/her understanding and acceptance.       Plan Discussed with:   Anesthesia Plan Comments:        Anesthesia Quick Evaluation

## 2019-03-21 NOTE — Progress Notes (Signed)
[facility]  PROGRESS NOTE  Frank [name] GMW:[id number] DOB: 01/23/1946 DOA: 03/16/2019 PCP: [name], MD Admit date - 03/16/2019   Admitting Physician [name], MD  Outpatient Primary MD for the patient is [name], MD  LOS - 4 Brief Narrative   Frank [name] is a 73 y.o. 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  In the ED found to be febrile to 103, tachypneic, hypoxic to 86% on room air requiring 2 L, WBC 29, lactic acid 2.8, creatinine 2.5, fecal occult positive, Covid test negative.  UA remarkable for large leukocytes, greater than 50 WBCs, rare bacteria Chest x-ray showed cardiomegaly with mild volume overload. 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  TTE negative, currently awaiting TEE, continuing penicillin G as recommended by ID  Subjective  Frank [name] today noticed improvement in right ankle pain.  Reports pain quite a lot overnight after starting IV Lasix.  Notes improvement in breathing as well A & P     Group G strep bacteremia and RLE nonpurulent cellulitis stable   high concern for potential prosthetic valve endocarditis, remains afebrile, leukocytosis improving, tenderness to right ankle improved, x-ray findings consistent with soft tissue infection  - TEE today,   -Repeat blood cultures negative so far  -Continue IV  penicillin G per ID recommendations   Sepsis, resolved. Likely related to group G  strep bacteremia.  Initially admitted for possible UTI given large leuks but asymptomatic and no bacteria.  Currently afebrile,White count downtrending, BP better.   -Continue IV penicillin G as mentioned above  Nonoliguric AKI on CKD stage III with hyperkalemia, improving. Likely related to hypotension related to sepsis/bacteremia.  Peak creatinine of 3.65, now 2.3, baseline 1.2-1.6.  UA unremarkable still making good urine, potassium wnl after lokelma  -Monitor urine output, follow BMP, avoid nephrotoxins  -If creatinine worsens or urine output decreases will obtain renal ultrasound formally consult  nephrology, anticipate improvement with IV fluids  -Hold home losartan          Acute hypoxic respiratory failure, resolved.  Likely resultant of acute exacerbation of CHF in  the setting of infection/bacteremia.  Improving as patient is currently room air down from 2 L  -Now tolerating IV Lasix and maintaining normal blood pressure  - closely monitor weights, output   Hypotension, improving.  SBP range in 24 hours 98/34-112/55 could be related to sepsis/bacteremia. Had worsening drop with IV diuretics  -DC IVF given improvement in BP and avoid worsening CHF  -Closely monitor on IV diuretics  Acute on chronicCombined systolic/diastolic CHF.    CXR with mild volume overload, currently on room air,  has significant B/l edema, R>L that has improved with initiation of IV Lasix diuresis overnight.  Net -3 L overnight, previously was holding diuresis given hypotension but maintaining normal BP now   -Continue IV Lasix 40 mg twice daily, monitor output and blood pressure, (home regimen 60 mg twice daily)  -Monitor ins and outs, daily weights,   -Holding off on home metolazone/spironolactone, due to previous hypotension  Mechanical mitral valve/aortic valve.  Supratherapeutic on admission requiring vitamin K,             currently 2.2 will need to hold warfarin prior to TEE  -Hold warfarin, daily  INR goal (2.5-3.5)  - heparin per pharmacy protocol        Anemia secondary to iron deficiency.  Goal hemoglobin greater than 8 given significant  cardiac history  -Hold off on further iron supplementation in setting of concurrent infection         Paroxysmal atrial fibrillation, complete heart block status post pacemaker.  Currently                rate controlled in NSR.  -Followed by Dr. [name] as outpatient continue to closely monitor        Chronic right lower leg edema.  Venous duplex negative for DVT  -Continue to monitor        Rectal bleeding.  Likely due to known diagnosis of hemorrhoids in the setting of supratherapeutic INR.  Currently stable, hemoglobin has slowly down trended.   -Has not had any further episodes during hospital stay, continue to monitor      Family Communication  : no family at bedside Code Status : Full code  Disposition Plan  : Continue IV penicillin G,  TEE for 12/11, close monitoring of blood pressure, creatinine/kidney function  Consults  : Cardiology, ID  Procedures  : TTE (12/9)  DVT Prophylaxis  : Heparin Lab Results  Component Value Date   PLT 207 03/21/2019    Diet :  Diet Order            Diet NPO time specified  Diet effective now               Inpatient Medications Scheduled Meds: . [MAR Hold] sodium chloride   Intravenous Once  . [MAR Hold] furosemide  40 mg Intravenous BID  . [MAR Hold] psyllium  1 packet Oral Daily   Continuous Infusions: . sodium chloride 100 mL/hr at 03/21/19 1325  . heparin 1,900 Units/hr (03/21/19 1324)  . [MAR Hold] penicillin g continuous IV infusion 8 Million Units (03/21/19 1214)   PRN Meds:.[MAR Hold] acetaminophen, [MAR Hold] HYDROcodone-acetaminophen, [MAR Hold] traMADol, [MAR Hold] witch hazel-glycerin  Antibiotics  :   Anti-infectives (From admission, onward)   Start     Dose/Rate Route Frequency Ordered Stop   03/20/19 1200  [MAR Hold]  penicillin G potassium 8 Million Units in  dextrose 5 % 500 mL continuous infusion     (MAR Hold since Fri 03/21/2019 at 1238.Hold Reason: Transfer to a Procedural area.)   8 Million Units 41.7 mL/hr over 12 Hours Intravenous Every 12 hours 03/20/19 0945     03/17/19 2200  cefTRIAXone (ROCEPHIN) 1 g in sodium chloride 0.9 % 100 mL IVPB  Status:  Discontinued     1 g 200 mL/hr over 30 Minutes Intravenous Every 24 hours 03/17/19 0240 03/17/19 1536   03/17/19 2200  cefTRIAXone (ROCEPHIN) 2 g in sodium chloride 0.9 % 100 mL IVPB  Status:  Discontinued     2 g 200 mL/hr over 30 Minutes Intravenous Every 24 hours 03/17/19 1536 03/20/19 0945   03/17/19 0130  cefTRIAXone (ROCEPHIN) 1 g in sodium chloride 0.9 % 100 mL IVPB     1 g 200 mL/hr over 30 Minutes Intravenous  Once 03/17/19 0123 03/17/19 0214       Objective   Vitals:   03/21/19 0500 03/21/19 0625 03/21/19  0700 03/21/19 1236  BP:   (!) 112/55 (!) 131/42  Pulse:   63 68  Resp: 18 20 20 20   Temp:   97.6 F (36.4 C) 98 F (36.7 C)  TempSrc:   Oral Temporal  SpO2:   90% 100%  Weight:  (!) 154.9 kg    Height:        SpO2: 100 % O2 Flow Rate (L/min): 2 L/min  Wt Readings from Last 3 Encounters:  03/21/19 (!) 154.9 kg  02/28/19 (!) 150.1 kg  02/14/19 (!) 156 kg     Intake/Output Summary (Last 24 hours) at 03/21/2019 1337 Last data filed at 03/21/2019 0925 Gross per 24 hour  Intake 256.8 ml  Output 3010 ml  Net -2753.2 ml    Physical Exam:  Awake Alert, Oriented X 3, Normal affect No new F.N deficits,  [place].AT, No JVD appreciated Symmetrical Chest wall movement, Good air movement bilaterally, CTAB on room air RRR,systolic click and murmur heard +ve B.Sounds, Abd Soft, No tenderness, No organomegaly appreciated, No rebound, guarding or rigidity. Right leg 2+ edema to above thigh, circumferential redness and warmth without tenderness on calf, left leg 1 + edema to above thigh. Right ankle noticeably less tender on palpation           I have personally  reviewed the following:   Data Reviewed:  CBC Recent Labs  Lab 03/16/19 2359 03/17/19 0743 03/18/19 0310 03/19/19 0220 03/19/19 2315 03/20/19 0430 03/21/19 0250  WBC 29.8* 42.8* 36.1* 24.6*  --  15.9* 12.6*  HGB 8.9* 8.3* 7.9* 7.5* 8.7* 8.5* 8.3*  HCT 28.7* 27.0* 24.3* 23.3* 26.8* 26.0* 25.2*  PLT 270 224 195 202  --  216 207  MCV 101.8* 103.1* 98.8 98.3  --  98.1 96.6  MCH 31.6 31.7 32.1 31.6  --  32.1 31.8  MCHC 31.0 30.7 32.5 32.2  --  32.7 32.9  RDW 17.0* 17.1* 17.0* 17.0*  --  17.6* 17.2*  LYMPHSABS 0.7  --   --   --   --   --   --   MONOABS 2.0*  --   --   --   --   --   --   EOSABS 0.0  --   --   --   --   --   --   BASOSABS 0.1  --   --   --   --   --   --     Chemistries  Recent Labs  Lab 03/18/19 0310 03/19/19 0220 03/19/19 0815 03/19/19 2315 03/20/19 0430 03/20/19 0745 03/20/19 1140 03/21/19 0250  NA 129* 128* 129*  --  130*  --   --  132*  K 5.1 5.5* 5.2* 5.1 4.8 4.8 5.4* 3.8  CL 92* 92* 92*  --  93*  --   --  95*  CO2 25 23 21*  --  23  --   --  23  GLUCOSE 90 94 107*  --  87  --   --  91  BUN 79* 92* 91*  --  90*  --   --  87*  CREATININE 3.53* 3.65* 3.51*  --  2.83*  --   --  2.33*  CALCIUM 8.8* 8.7* 8.7*  --  8.7*  --   --  8.8*   ------------------------------------------------------------------------------------------------------------------ No results for input(s): CHOL, HDL, LDLCALC, TRIG, CHOLHDL, LDLDIRECT in the last 72 hours.  Lab Results  Component Value Date   HGBA1C 5.7 (H) 09/18/2017   ------------------------------------------------------------------------------------------------------------------  No results for input(s): TSH, T4TOTAL, T3FREE, THYROIDAB in the last 72 hours.  Invalid input(s): FREET3 ------------------------------------------------------------------------------------------------------------------ No results for input(s): VITAMINB12, FOLATE, FERRITIN, TIBC, IRON, RETICCTPCT in the last 72 hours.  Coagulation  profile Recent Labs  Lab 03/17/19 0743 03/18/19 0310 03/19/19 0220 03/20/19 0430 03/21/19 0250  INR 5.2* 6.3* 2.7* 2.2* 2.3*    No results for input(s): DDIMER in the last 72 hours.  Cardiac Enzymes No results for input(s): CKMB, TROPONINI, MYOGLOBIN in the last 168 hours.  Invalid input(s): CK ------------------------------------------------------------------------------------------------------------------    Component Value Date/Time   BNP 105.7 (H) 03/16/2019 2359   BNP 31.3 07/28/2015 0829    Micro Results Recent Results (from the past 240 hour(s))  Blood culture (routine x 2)     Status: Abnormal   Collection Time: 03/16/19 11:59 PM   Specimen: BLOOD  Result Value Ref Range Status   Specimen Description BLOOD RIGHT HAND  Final   Special Requests   Final    BOTTLES DRAWN AEROBIC AND ANAEROBIC Blood Culture results may not be optimal due to an inadequate volume of blood received in culture bottles   Culture  Setup Time   Final    GRAM POSITIVE COCCI IN CHAINS AEROBIC BOTTLE ONLY CRITICAL RESULT CALLED TO, READ BACK BY AND VERIFIED WITH: E. SINCLAIR, PHARMD AT 2979 ON 03/17/19 BY C. JESSUP, MT.    Culture (A)  Final    STREPTOCOCCUS GROUP G SUSCEPTIBILITIES PERFORMED ON PREVIOUS CULTURE WITHIN THE LAST 5 DAYS. Performed at [facility], [place] [address], [place], [place] [address]    Report Status 03/19/2019 FINAL  Final  Blood culture (routine x 2)     Status: Abnormal   Collection Time: 03/16/19 11:59 PM   Specimen: BLOOD  Result Value Ref Range Status   Specimen Description BLOOD LEFT HAND  Final   Special Requests   Final    BOTTLES DRAWN AEROBIC AND ANAEROBIC Blood Culture adequate volume   Culture  Setup Time   Final    GRAM POSITIVE COCCI IN CHAINS IN BOTH AEROBIC AND ANAEROBIC BOTTLES CRITICAL RESULT CALLED TO, READ BACK BY AND VERIFIED WITHBronwen Betters [facility] 9417 03/17/19 A BROWNING Performed at [facility], [place] [address] [place],  [place] [address]    Culture STREPTOCOCCUS GROUP G (A)  Final   Report Status 03/19/2019 FINAL  Final   Organism ID, Bacteria STREPTOCOCCUS GROUP G  Final      Susceptibility   Streptococcus group g - MIC*    CLINDAMYCIN <=0.25 SENSITIVE Sensitive     AMPICILLIN <=0.25 SENSITIVE Sensitive     ERYTHROMYCIN <=0.12 SENSITIVE Sensitive     VANCOMYCIN 0.5 SENSITIVE Sensitive     CEFTRIAXONE <=0.12 SENSITIVE Sensitive     LEVOFLOXACIN 0.5 SENSITIVE Sensitive     PENICILLIN Value in next row Sensitive      SENSITIVE<=0.06    * STREPTOCOCCUS GROUP G  Blood Culture ID Panel (Reflexed)     Status: Abnormal   Collection Time: 03/16/19 11:59 PM  Result Value Ref Range Status   Enterococcus species NOT DETECTED NOT DETECTED Final   Listeria monocytogenes NOT DETECTED NOT DETECTED Final   Staphylococcus species NOT DETECTED NOT DETECTED Final   Staphylococcus aureus (BCID) NOT DETECTED NOT DETECTED Final   Streptococcus species DETECTED (A) NOT DETECTED Final    Comment: Not Enterococcus species, Streptococcus agalactiae, Streptococcus pyogenes, or Streptococcus pneumoniae. CRITICAL RESULT CALLED TO, READ BACK BY AND VERIFIED WITH: L CURRAN PHARMD 1741  03/17/19 A BROWNING    Streptococcus agalactiae NOT DETECTED NOT DETECTED Final   Streptococcus pneumoniae NOT DETECTED NOT DETECTED Final   Streptococcus pyogenes NOT DETECTED NOT DETECTED Final   Acinetobacter baumannii NOT DETECTED NOT DETECTED Final   Enterobacteriaceae species NOT DETECTED NOT DETECTED Final   Enterobacter cloacae complex NOT DETECTED NOT DETECTED Final   Escherichia coli NOT DETECTED NOT DETECTED Final   Klebsiella oxytoca NOT DETECTED NOT DETECTED Final   Klebsiella pneumoniae NOT DETECTED NOT DETECTED Final   Proteus species NOT DETECTED NOT DETECTED Final   Serratia marcescens NOT DETECTED NOT DETECTED Final   Haemophilus influenzae NOT DETECTED NOT DETECTED Final   Neisseria meningitidis NOT DETECTED NOT DETECTED Final    Pseudomonas aeruginosa NOT DETECTED NOT DETECTED Final   Candida albicans NOT DETECTED NOT DETECTED Final   Candida glabrata NOT DETECTED NOT DETECTED Final   Candida krusei NOT DETECTED NOT DETECTED Final   Candida parapsilosis NOT DETECTED NOT DETECTED Final   Candida tropicalis NOT DETECTED NOT DETECTED Final    Comment: Performed at [facility], [place] [address] [place], [place] [address]  SARS CORONAVIRUS 2 (TAT 6-24 HRS) Nasopharyngeal Nasopharyngeal Swab     Status: None   Collection Time: 03/17/19 12:32 AM   Specimen: Nasopharyngeal Swab  Result Value Ref Range Status   SARS Coronavirus 2 NEGATIVE NEGATIVE Final    Comment: (NOTE) SARS-CoV-2 target nucleic acids are NOT DETECTED. The SARS-CoV-2 RNA is generally detectable in upper and lower respiratory specimens during the acute phase of infection. Negative results do not preclude SARS-CoV-2 infection, do not rule out co-infections with other pathogens, and should not be used as the sole basis for treatment or other patient management decisions. Negative results must be combined with clinical observations, patient history, and epidemiological information. The expected result is Negative. Fact Sheet for Patients: SugarRoll.be Fact Sheet for Healthcare Providers: https://www.woods-mathews.com/ This test is not yet approved or cleared by the [place] FDA and  has been authorized for detection and/or diagnosis of SARS-CoV-2 by FDA under an Emergency Use Authorization (EUA). This EUA will remain  in effect (meaning this test can be used) for the duration of the COVID-19 declaration under Section 56 4(b)(1) of the Act, 21 U.S.C. section 360bbb-3(b)(1), unless the authorization is terminated or revoked sooner. Performed at [facility], [place] [address] [place], [place] [address]   Urine culture     Status: Abnormal   Collection Time: 03/17/19 12:54 AM   Specimen: Urine,  Random  Result Value Ref Range Status   Specimen Description URINE, RANDOM  Final   Special Requests   Final    NONE Performed at [facility], [place] [address], [place], [place] [address]    Culture MULTIPLE SPECIES PRESENT, SUGGEST RECOLLECTION (A)  Final   Report Status 03/18/2019 FINAL  Final  Culture, blood (Routine X 2) w Reflex to ID Panel     Status: None (Preliminary result)   Collection Time: 03/19/19  4:02 PM   Specimen: BLOOD RIGHT HAND  Result Value Ref Range Status   Specimen Description BLOOD RIGHT HAND  Final   Special Requests   Final    BOTTLES DRAWN AEROBIC AND ANAEROBIC Blood Culture results may not be optimal due to an inadequate volume of blood received in culture bottles   Culture   Final    NO GROWTH 2 DAYS Performed at [facility], [place] [address], [place], [place] [address]  Report Status PENDING  Incomplete  Culture, blood (Routine X 2) w Reflex to ID Panel     Status: None (Preliminary result)   Collection Time: 03/19/19  4:10 PM   Specimen: BLOOD RIGHT HAND  Result Value Ref Range Status   Specimen Description BLOOD RIGHT HAND  Final   Special Requests   Final    BOTTLES DRAWN AEROBIC AND ANAEROBIC Blood Culture results may not be optimal due to an inadequate volume of blood received in culture bottles   Culture   Final    NO GROWTH 2 DAYS Performed at [facility], [place] [address] [place], [place] [address]    Report Status PENDING  Incomplete    Radiology Reports DG Chest Port 1 View  Result Date: 03/16/2019 CLINICAL DATA:  Shortness of breath EXAM: PORTABLE CHEST 1 VIEW COMPARISON:  July 10, 2018 FINDINGS: The heart size is significantly enlarged. The patient is status post prior median sternotomy. Aortic calcifications are noted. There is a multi lead right-sided pacemaker in place. There is vascular congestion without overt pulmonary edema. There is no pneumothorax or large pleural effusion. There is no acute osseous  abnormality. IMPRESSION: Cardiomegaly with mild volume overload. Electronically Signed   By: Constance Holster M.D.   On: 03/16/2019 23:50   DG Ankle Right Port  Result Date: 03/20/2019 CLINICAL DATA:  Right ankle pain., Redness and swelling. EXAM: PORTABLE RIGHT ANKLE - 2 VIEW COMPARISON:  None. FINDINGS: There is no acute bony or joint abnormality. No soft tissue gas or radiopaque foreign body. Soft tissues are diffusely swollen. Atherosclerosis noted. No radiopaque foreign body. IMPRESSION: Diffuse soft tissue swelling about the ankle could be due to dependent change or cellulitis. The exam is otherwise negative. Electronically Signed   By: Inge Rise M.D.   On: 03/20/2019 14:51   ECHOCARDIOGRAM COMPLETE  Result Date: 03/19/2019   ECHOCARDIOGRAM REPORT   Patient Name:   Frank [name] Date of Exam: 03/19/2019 Medical Rec #:  [id number]       Height:       71.0 in Accession #:    [id number]      Weight:       345.7 lb Date of Birth:  04/21/1945      BSA:          2.66 m Patient Age:    63 years        BP:           99/43 mmHg Patient Gender: M               HR:           60 bpm. Exam Location:  Inpatient Procedure: 2D Echo, Cardiac Doppler, Color Doppler and Intracardiac            Opacification Agent                            MODIFIED REPORT: This report was modified by Eleonore Chiquito MD on 03/19/2019 due to error.  Indications:     Bacteremia.  History:         Patient has prior history of Echocardiogram examinations, most                  recent 02/14/2019. Non- ischemic cardiomyopathy and CHF,                  Pacemaker and Abnormal ECG, TIA and Pulmonary HTN, Mitral  Valve                  Disease, Arrythmias:Complete heart block; Risk                  Factors:Hypertension. Mitral Valve: mechanical valve is present                  in the mitral position.  Sonographer:     Roseanna Rainbow RDCS Referring Phys:  [id number] [name] Diagnosing Phys: Eleonore Chiquito MD  Sonographer Comments: Technically  difficult study due to poor echo windows, Technically challenging study due to limited acoustic windows, patient is morbidly obese, suboptimal subcostal window, suboptimal apical window and suboptimal parasternal window.  Image acquisition challenging due to patient body habitus. IMPRESSIONS  1. No evidence of infective endocarditis on this study. A TEE is recommended to exclude endocarditis.  2. The patient is s/p myomectomy with mechanical MVR and AoV repair (reported in record). Overall, the EF remains unchanged with prior. The mechanical MV prosthesis is functioning normally. The AoV is severely calcified with restricted movement. I suspect there is at least mild to moderate aortic stenosis, but doppler interrogation on this study is suboptimal. The tricuspid regurgitation on this study is severe with systolic hepatic vein flow reversal (not well evaluated on last study). Findings compared directly with study from 02/14/2019.  3. Left ventricular ejection fraction, by visual estimation, is 55 to 60%. The left ventricle has normal function. There is mildly increased left ventricular hypertrophy.  4. Definity contrast agent was given IV to delineate the left ventricular endocardial borders.  5. Left ventricular diastolic function could not be evaluated.  6. Right ventricular volume overload.  7. The left ventricle has no regional wall motion abnormalities.  8. Global right ventricle has severely reduced systolic function.The right ventricular size is severely enlarged. No increase in right ventricular wall thickness.  9. Left atrial size was mild-moderately dilated. 10. Right atrial size was severely dilated. 11. The mitral valve has been repaired/replaced. No evidence of mitral valve regurgitation. 12. Mechanical mitral valve present. Mean gradient 5 mmHG with heart rate 60 bpm. EOA 5 mmHG. Normal functioning prosthetic valve with stable gradients compared with prior. 13. The tricuspid valve is grossly normal.  Tricuspid valve regurgitation is severe. 14. Aortic valve regurgitation is mild. Mild to moderate aortic valve stenosis. 15. The aortic valve is severely calcified with restricted leaflet motion. Doppler interrogation is poor on this study. Vmax 2.5 m/s, Mean gradient ~8.5 mmHG, but suspect this is severely underestimated. There is likely a mild to moderate degree of aortic stenosis present. 16. The pulmonic valve was grossly normal. Pulmonic valve regurgitation is not visualized. 17. Mild plaque invoving the aortic root. 18. Normal pulmonary artery systolic pressure. 19. The tricuspid regurgitant velocity is 1.76 m/s, and with an assumed right atrial pressure of 15 mmHg, the estimated right ventricular systolic pressure is normal at 27.4 mmHg. 20. A pacer wire is visualized in the RA and RV. FINDINGS  Left Ventricle: Left ventricular ejection fraction, by visual estimation, is 55 to 60%. The left ventricle has normal function. Definity contrast agent was given IV to delineate the left ventricular endocardial borders. The left ventricle has no regional wall motion abnormalities. There is mildly increased left ventricular hypertrophy. Concentric left ventricular hypertrophy. The interventricular septum is flattened in diastole ('D' shaped left ventricle), consistent with right ventricular volume overload. The left ventricular diastology could not be evaluated due to mitral valve replacement/repair. Left ventricular diastolic  function could not be evaluated. Right Ventricle: The right ventricular size is severely enlarged. No increase in right ventricular wall thickness. Global RV systolic function is has severely reduced systolic function. The tricuspid regurgitant velocity is 1.76 m/s, and with an assumed right atrial pressure of 15 mmHg, the estimated right ventricular systolic pressure is normal at 27.4 mmHg. Left Atrium: Left atrial size was mild-moderately dilated. Right Atrium: Right atrial size was severely  dilated Pericardium: There is no evidence of pericardial effusion. Mitral Valve: The mitral valve has been repaired/replaced. No evidence of mitral valve regurgitation. MV peak gradient, 19.1 mmHg. Mechanical mitral valve present. Mean gradient 5 mmHG with heart rate 60 bpm. EOA 5 mmHG. Normal functioning prosthetic valve with stable gradients compared with prior. Tricuspid Valve: The tricuspid valve is grossly normal. Tricuspid valve regurgitation is severe. The flow in the hepatic veins is reversed during ventricular systole. Aortic Valve: The aortic valve has been repaired/replaced. Aortic valve regurgitation is mild. Mild to moderate aortic stenosis is present. Aortic valve mean gradient measures 8.5 mmHg. Aortic valve peak gradient measures 16.2 mmHg. Aortic valve area, by  VTI measures 2.40 cm. The aortic valve is severely calcified with restricted leaflet motion. Doppler interrogation is poor on this study. Vmax 2.5 m/s, Mean gradient ~8.5 mmHG, but suspect this is severely underestimated. There is likely a mild to moderate degree of aortic stenosis present. Pulmonic Valve: The pulmonic valve was grossly normal. Pulmonic valve regurgitation is not visualized. Pulmonic regurgitation is not visualized. Aorta: The aortic root is normal in size and structure. There is mild, layered plaque involving the aortic root. IAS/Shunts: No atrial level shunt detected by color flow Doppler. Additional Comments: A pacer wire is visualized in the right atrium and right ventricle.  LEFT VENTRICLE PLAX 2D LVIDd:         4.10 cm       Diastology LVIDs:         3.00 cm       LV e' lateral:   11.70 cm/s LV PW:         2.10 cm       LV E/e' lateral: 12.3 LV IVS:        1.70 cm       LV e' medial:    8.59 cm/s LVOT diam:     2.20 cm       LV E/e' medial:  16.8 LV SV:         39 ml LV SV Index:   13.60 LVOT Area:     3.80 cm  LV Volumes (MOD) LV area d, A2C:    33.90 cm LV area d, A4C:    38.70 cm LV area s, A2C:    23.20 cm LV  area s, A4C:    25.93 cm LV major d, A2C:   7.96 cm LV major d, A4C:   8.64 cm LV major s, A2C:   7.74 cm LV major s, A4C:   8.04 cm LV vol d, MOD A2C: 120.0 ml LV vol d, MOD A4C: 147.4 ml LV vol s, MOD A2C: 58.2 ml LV vol s, MOD A4C: 71.6 ml LV SV MOD A2C:     61.8 ml LV SV MOD A4C:     147.4 ml LV SV MOD BP:      71.1 ml RIGHT VENTRICLE            IVC RV S prime:     8.81 cm/s  IVC diam: 4.70 cm TAPSE (  M-mode): 1.5 cm LEFT ATRIUM            Index       RIGHT ATRIUM           Index LA diam:      6.40 cm  2.41 cm/m  RA Area:     42.00 cm LA Vol (A2C): 108.0 ml 40.59 ml/m RA Volume:   177.00 ml 66.52 ml/m LA Vol (A4C): 147.0 ml 55.25 ml/m  AORTIC VALVE AV Area (Vmax):    2.51 cm AV Area (Vmean):   2.58 cm AV Area (VTI):     2.40 cm AV Vmax:           201.50 cm/s AV Vmean:          132.000 cm/s AV VTI:            0.419 m AV Peak Grad:      16.2 mmHg AV Mean Grad:      8.5 mmHg LVOT Vmax:         133.00 cm/s LVOT Vmean:        89.600 cm/s LVOT VTI:          0.264 m LVOT/AV VTI ratio: 0.63  AORTA Ao Root diam: 3.70 cm Ao Asc diam:  3.40 cm MITRAL VALVE                        TRICUSPID VALVE MV Area (PHT): 2.48 cm             TV Peak grad:   17.1 mmHg MV Peak grad:  19.1 mmHg            TV Mean grad:   5.0 mmHg MV Mean grad:  5.0 mmHg             TV Vmax:        2.07 m/s MV Vmax:       2.18 m/s             TV Vmean:       93.2 cm/s MV Vmean:      93.5 cm/s            TV VTI:         0.61 msec MV VTI:        0.54 m               TR Peak grad:   12.4 mmHg MV PHT:        88.74 msec           TR Vmax:        178.00 cm/s MV Decel Time: 306 msec MV E velocity: 144.00 cm/s 103 cm/s SHUNTS                                     Systemic VTI:  0.26 m                                     Systemic Diam: 2.20 cm  Eleonore Chiquito MD Electronically signed by Eleonore Chiquito MD Signature Date/Time: 03/19/2019/11:50:41 AM    Final (Updated)    CUP PACEART REMOTE DEVICE CHECK  Result Date: 03/06/2019 Scheduled remote reviewed.   Normal device function.  No atrial arrhythmia detected since last remote on 02/25/2019 Next remote 91 days.  Kathy Breach, RN, CCDS, [employer]  VAS [name] LOWER EXTREMITY VENOUS (DVT)  Result Date: 03/17/2019  Lower Venous Study Indications: Swelling, and Edema.  Limitations: Body habitus and poor ultrasound/tissue interface. Comparison Study: no prior Performing Technologist: Abram Sander RVS  Examination Guidelines: A complete evaluation includes B-mode imaging, spectral Doppler, color Doppler, and power Doppler as needed of all accessible portions of each vessel. Bilateral testing is considered an integral part of a complete examination. Limited examinations for reoccurring indications may be performed as noted.  +---------+---------------+---------+-----------+----------+--------------+ RIGHT    CompressibilityPhasicitySpontaneityPropertiesThrombus Aging +---------+---------------+---------+-----------+----------+--------------+ CFV      Full           Yes      Yes                                 +---------+---------------+---------+-----------+----------+--------------+ SFJ      Full                                                        +---------+---------------+---------+-----------+----------+--------------+ FV Prox  Full                                                        +---------+---------------+---------+-----------+----------+--------------+ FV Mid                  Yes      Yes                                 +---------+---------------+---------+-----------+----------+--------------+ FV Distal               Yes      Yes                                 +---------+---------------+---------+-----------+----------+--------------+ PFV      Full                                                        +---------+---------------+---------+-----------+----------+--------------+ POP      Full           Yes      Yes                                  +---------+---------------+---------+-----------+----------+--------------+ PTV                                                   Not visualized +---------+---------------+---------+-----------+----------+--------------+ PERO  Not visualized +---------+---------------+---------+-----------+----------+--------------+   +----+---------------+---------+-----------+----------+--------------+ LEFTCompressibilityPhasicitySpontaneityPropertiesThrombus Aging +----+---------------+---------+-----------+----------+--------------+ CFV                Yes      Yes                                 +----+---------------+---------+-----------+----------+--------------+     Summary: Right: There is no evidence of deep vein thrombosis in the lower extremity. However, portions of this examination were limited- see technologist comments above. No cystic structure found in the popliteal fossa. Left: No evidence of common femoral vein obstruction.  *See table(s) above for measurements and observations. Electronically signed by Servando Snare MD on 03/17/2019 at 5:03:22 PM.    Final      Time Spent in minutes  30     Desiree Hane M.D on 03/21/2019 at 1:37 PM  To page go to [email or web address] - password [facility]

## 2019-03-21 NOTE — Transfer of Care (Signed)
Immediate Anesthesia Transfer of Care Note  Patient: Frank [name]  Procedure(s) Performed: TRANSESOPHAGEAL ECHOCARDIOGRAM (TEE) (N/A )  Patient Location: Endoscopy Unit  Anesthesia Type:MAC  Level of Consciousness: sedated  Airway & Oxygen Therapy: Patient connected to face mask oxygen  Post-op Assessment: Post -op Vital signs reviewed and stable  Post vital signs: stable  Last Vitals:  Vitals Value Taken Time  BP    Temp    Pulse    Resp    SpO2      Last Pain:  Vitals:   03/21/19 1236  TempSrc: Temporal  PainSc: 0-No pain      Patients Stated Pain Goal: 0 (37/34/28 7681)  Complications: No apparent anesthesia complications

## 2019-03-21 NOTE — Progress Notes (Signed)
  Echocardiogram Echocardiogram Transesophageal has been performed.  Frank [name] 03/21/2019, 2:00 PM

## 2019-03-21 NOTE — Progress Notes (Signed)
ANTICOAGULATION CONSULT NOTE - Follow Up Consult  Pharmacy Consult for Heparin; Coumadin (on hold) Indication: mechanical MVR (1999) and atrial fibrillation  Allergies  Allergen Reactions  . Warfarin And Related Other (See Comments)    COUMADIN or JANTOVEN BRAND ONLY** Per pt, was switched to generic in 2001 and had significantly decreased absorption > INR subtherapeutic> pt suffered a TIA and has been on brand name only since     Patient Measurements: Height: 5\' 11"  (180.3 cm) Weight: (!) 349 lb 6.9 oz (158.5 kg) IBW/kg (Calculated) : 75.3 Heparin Dosing Weight: 111 kg  Vital Signs: Temp: 98 F (36.7 C) (12/10 2359) Temp Source: Oral (12/10 2359) BP: 116/69 (12/10 2359) Pulse Rate: 62 (12/10 2003)  Labs: Recent Labs    03/19/19 0220 03/19/19 0815 03/19/19 2315 03/20/19 0430 03/20/19 0745 03/20/19 1811 03/21/19 0250  HGB 7.5*  --  8.7* 8.5*  --   --  8.3*  HCT 23.3*  --  26.8* 26.0*  --   --  25.2*  PLT 202  --   --  216  --   --  207  LABPROT 28.5*  --   --  24.5*  --   --  24.8*  INR 2.7*  --   --  2.2*  --   --  2.3*  HEPARINUNFRC  --   --   --   --  <0.10* <0.10* 0.22*  CREATININE 3.65* 3.51*  --  2.83*  --   --   --     Estimated Creatinine Clearance: 37.3 mL/min (A) (by C-G formula based on SCr of 2.83 mg/dL (H)).  Assessment:  73 yo M on Coumadin for mechanical MVR 1999 and Afib admitted with melena and INR of 5.2> inc 6.3 despite holding warfarin doses. Vitamin K 2 mg IV given on 12/8 >  INR 2.7  - Pharmacy was consulted to dose warfarin, with plans to continue PTA BRAND COUMADIN. Currently on hold for TEE on 12/11.  IV heparin begun 12/9 pm.  Of note, patient takes COUMADIN or JANTOVEN BRAND ONLY** Per pt, was switched to generic in 2001 and had significantly decreased absorption > INR subtherapeutic> pt suffered a TIA and has been on brand name only since then. Wife brought in home supply since we only have generic warfarin.    Home Coumadin: 15 mg Sun/Wed,  10 all other days (stable dose since 2019). Per patient, has been drinking vinegar and eating more picked vegetables, but otherwise no change in diet.  Heparin level subtherapeutic at 0.22 with AM labs s/p rate increase to 1700 units/hr  Goal of Therapy:  Heparin level 0.3-0.7 units/ml INR 2.5 - 3.5 Monitor platelets by anticoagulation protocol: Yes   Plan:  Increase heparin gtt to 1900 units/hr F/u 8 hour heparin level  Bertis Ruddy, PharmD Clinical Pharmacist Please check AMION for all [place] numbers 03/21/2019 4:30 AM

## 2019-03-21 NOTE — Care Management Important Message (Signed)
Important Message  Patient Details  Name: Frank [name] MRN: [id number] Date of Birth: 09-29-1945   Medicare Important Message Given:  Yes     Orbie Pyo 03/21/2019, 2:23 PM

## 2019-03-21 NOTE — Progress Notes (Signed)
[facility] for Infectious Disease  Date of Admission:  03/16/2019     Total days of antibiotics          ASSESSMENT:  Mr. Dacus has remained afebrile and stable leukocytosis. Continues to have pain in his right lower extremity and is awaiting TEE this afternoon to rule out endocarditis. Mild increase in edema and discoloration likely due to being in a dependent position. Will continue current dose of penicillin with duration of therapy pending TEE results. Discussed importance of leg elevation and fluid management. He may benefit from compression or Unna boot.   PLAN:  1. Continue Penicillin G  2. Await results for TEE for duration of treatment. 3. Continue fluid management and elevation of lower extremities to reduce lymphedema as able.  4. Continue pain management per primary team.   Principal Problem:   UTI (urinary tract infection) Active Problems:   MORBID OBESITY   S/P aortic valve repair   S/P mitral valve replacement-Mechanical   Chronic combined systolic and diastolic heart failure (HCC)   Complete heart block (HCC)   Rectal bleeding   Sepsis (HCC)   Acute respiratory failure with hypoxia (HCC)   Acute exacerbation of CHF (congestive heart failure) (HCC)   AKI (acute kidney injury) (HCC)   Group G streptococcal infection   Bacteremia   Hypotension   . sodium chloride   Intravenous Once  . furosemide  40 mg Intravenous BID  . psyllium  1 packet Oral Daily    SUBJECTIVE:  Afebrile overnight with no acute events. Continues to have pain in his right lower extremity and heel.  Has had increased output this morning of 800 cc per report.   Allergies  Allergen Reactions  . Warfarin And Related Other (See Comments)    COUMADIN or JANTOVEN BRAND ONLY** Per pt, was switched to generic in 2001 and had significantly decreased absorption > INR subtherapeutic> pt suffered a TIA and has been on brand name only since      Review of Systems: Review of Systems   Constitutional: Negative for chills, fever and weight loss.  Respiratory: Negative for cough, shortness of breath and wheezing.   Cardiovascular: Negative for chest pain and leg swelling.  Gastrointestinal: Negative for abdominal pain, constipation, diarrhea, nausea and vomiting.  Musculoskeletal:       Positive for right lower extremity pain.   Skin: Negative for rash.      OBJECTIVE: Vitals:   03/21/19 0434 03/21/19 0500 03/21/19 0625 03/21/19 0700  BP: (!) 106/49   (!) 112/55  Pulse:    63  Resp: 13 18 20 20   Temp: 97.9 F (36.6 C)   97.6 F (36.4 C)  TempSrc: Oral   Oral  SpO2: 96%   90%  Weight:   (!) 154.9 kg   Height:       Body mass index is 47.63 kg/m.  Physical Exam Constitutional:      General: He is not in acute distress.    Appearance: He is well-developed. He is obese.     Comments: Seated in the chair; right lower extremity in mostly dependent position; pleasant.   Cardiovascular:     Rate and Rhythm: Normal rate and regular rhythm.     Heart sounds: Normal heart sounds.  Pulmonary:     Effort: Pulmonary effort is normal.     Breath sounds: Normal breath sounds.  Musculoskeletal:     Comments: Right lower extremity appears with mildly increased with continued discoloration.  Skin:    General: Skin is warm and dry.  Neurological:     Mental Status: He is alert and oriented to person, place, and time.  Psychiatric:        Behavior: Behavior normal.        Thought Content: Thought content normal.        Judgment: Judgment normal.     Lab Results Lab Results  Component Value Date   WBC 12.6 (H) 03/21/2019   HGB 8.3 (L) 03/21/2019   HCT 25.2 (L) 03/21/2019   MCV 96.6 03/21/2019   PLT 207 03/21/2019    Lab Results  Component Value Date   CREATININE 2.33 (H) 03/21/2019   BUN 87 (H) 03/21/2019   NA 132 (L) 03/21/2019   K 3.8 03/21/2019   CL 95 (L) 03/21/2019   CO2 23 03/21/2019    Lab Results  Component Value Date   ALT 15 (L)  01/18/2017   AST 30 01/18/2017   ALKPHOS 77 01/18/2017   BILITOT 1.4 (H) 01/18/2017     Microbiology: Recent Results (from the past 240 hour(s))  Blood culture (routine x 2)     Status: Abnormal   Collection Time: 03/16/19 11:59 PM   Specimen: BLOOD  Result Value Ref Range Status   Specimen Description BLOOD RIGHT HAND  Final   Special Requests   Final    BOTTLES DRAWN AEROBIC AND ANAEROBIC Blood Culture results may not be optimal due to an inadequate volume of blood received in culture bottles   Culture  Setup Time   Final    GRAM POSITIVE COCCI IN CHAINS AEROBIC BOTTLE ONLY CRITICAL RESULT CALLED TO, READ BACK BY AND VERIFIED WITH: E. SINCLAIR, PHARMD AT 1530 ON 03/17/19 BY C. JESSUP, MT.    Culture (A)  Final    STREPTOCOCCUS GROUP G SUSCEPTIBILITIES PERFORMED ON PREVIOUS CULTURE WITHIN THE LAST 5 DAYS. Performed at [facility], [place] [address], [place], [place] [address]    Report Status 03/19/2019 FINAL  Final  Blood culture (routine x 2)     Status: Abnormal   Collection Time: 03/16/19 11:59 PM   Specimen: BLOOD  Result Value Ref Range Status   Specimen Description BLOOD LEFT HAND  Final   Special Requests   Final    BOTTLES DRAWN AEROBIC AND ANAEROBIC Blood Culture adequate volume   Culture  Setup Time   Final    GRAM POSITIVE COCCI IN CHAINS IN BOTH AEROBIC AND ANAEROBIC BOTTLES CRITICAL RESULT CALLED TO, READ BACK BY AND VERIFIED WITHBronwen Betters [facility] 2130 03/17/19 A BROWNING Performed at [facility], [place] [address] [place], [place] [address]    Culture STREPTOCOCCUS GROUP G (A)  Final   Report Status 03/19/2019 FINAL  Final   Organism ID, Bacteria STREPTOCOCCUS GROUP G  Final      Susceptibility   Streptococcus group g - MIC*    CLINDAMYCIN <=0.25 SENSITIVE Sensitive     AMPICILLIN <=0.25 SENSITIVE Sensitive     ERYTHROMYCIN <=0.12 SENSITIVE Sensitive     VANCOMYCIN 0.5 SENSITIVE Sensitive     CEFTRIAXONE <=0.12 SENSITIVE Sensitive      LEVOFLOXACIN 0.5 SENSITIVE Sensitive     PENICILLIN Value in next row Sensitive      SENSITIVE<=0.06    * STREPTOCOCCUS GROUP G  Blood Culture ID Panel (Reflexed)     Status: Abnormal   Collection Time: 03/16/19 11:59 PM  Result Value Ref Range Status   Enterococcus species NOT DETECTED NOT  DETECTED Final   Listeria monocytogenes NOT DETECTED NOT DETECTED Final   Staphylococcus species NOT DETECTED NOT DETECTED Final   Staphylococcus aureus (BCID) NOT DETECTED NOT DETECTED Final   Streptococcus species DETECTED (A) NOT DETECTED Final    Comment: Not Enterococcus species, Streptococcus agalactiae, Streptococcus pyogenes, or Streptococcus pneumoniae. CRITICAL RESULT CALLED TO, READ BACK BY AND VERIFIED WITH: Bronwen Betters PHARMD 1741 03/17/19 A BROWNING    Streptococcus agalactiae NOT DETECTED NOT DETECTED Final   Streptococcus pneumoniae NOT DETECTED NOT DETECTED Final   Streptococcus pyogenes NOT DETECTED NOT DETECTED Final   Acinetobacter baumannii NOT DETECTED NOT DETECTED Final   Enterobacteriaceae species NOT DETECTED NOT DETECTED Final   Enterobacter cloacae complex NOT DETECTED NOT DETECTED Final   Escherichia coli NOT DETECTED NOT DETECTED Final   Klebsiella oxytoca NOT DETECTED NOT DETECTED Final   Klebsiella pneumoniae NOT DETECTED NOT DETECTED Final   Proteus species NOT DETECTED NOT DETECTED Final   Serratia marcescens NOT DETECTED NOT DETECTED Final   Haemophilus influenzae NOT DETECTED NOT DETECTED Final   Neisseria meningitidis NOT DETECTED NOT DETECTED Final   Pseudomonas aeruginosa NOT DETECTED NOT DETECTED Final   Candida albicans NOT DETECTED NOT DETECTED Final   Candida glabrata NOT DETECTED NOT DETECTED Final   Candida krusei NOT DETECTED NOT DETECTED Final   Candida parapsilosis NOT DETECTED NOT DETECTED Final   Candida tropicalis NOT DETECTED NOT DETECTED Final    Comment: Performed at [facility], [place]. [address] [place], [place] [address]  SARS CORONAVIRUS  2 (TAT 6-24 HRS) Nasopharyngeal Nasopharyngeal Swab     Status: None   Collection Time: 03/17/19 12:32 AM   Specimen: Nasopharyngeal Swab  Result Value Ref Range Status   SARS Coronavirus 2 NEGATIVE NEGATIVE Final    Comment: (NOTE) SARS-CoV-2 target nucleic acids are NOT DETECTED. The SARS-CoV-2 RNA is generally detectable in upper and lower respiratory specimens during the acute phase of infection. Negative results do not preclude SARS-CoV-2 infection, do not rule out co-infections with other pathogens, and should not be used as the sole basis for treatment or other patient management decisions. Negative results must be combined with clinical observations, patient history, and epidemiological information. The expected result is Negative. Fact Sheet for Patients: SugarRoll.be Fact Sheet for Healthcare Providers: https://www.woods-mathews.com/ This test is not yet approved or cleared by the [place] FDA and  has been authorized for detection and/or diagnosis of SARS-CoV-2 by FDA under an Emergency Use Authorization (EUA). This EUA will remain  in effect (meaning this test can be used) for the duration of the COVID-19 declaration under Section 56 4(b)(1) of the Act, 21 U.S.C. section 360bbb-3(b)(1), unless the authorization is terminated or revoked sooner. Performed at [facility], [place] [address] [place], [place] [address]   Urine culture     Status: Abnormal   Collection Time: 03/17/19 12:54 AM   Specimen: Urine, Random  Result Value Ref Range Status   Specimen Description URINE, RANDOM  Final   Special Requests   Final    NONE Performed at [facility], [place] [address] [place], [place] [address]    Culture MULTIPLE SPECIES PRESENT, SUGGEST RECOLLECTION (A)  Final   Report Status 03/18/2019 FINAL  Final  Culture, blood (Routine X 2) w Reflex to ID Panel     Status: None (Preliminary result)   Collection Time: 03/19/19   4:02 PM   Specimen: BLOOD RIGHT HAND  Result Value Ref Range Status   Specimen  Description BLOOD RIGHT HAND  Final   Special Requests   Final    BOTTLES DRAWN AEROBIC AND ANAEROBIC Blood Culture results may not be optimal due to an inadequate volume of blood received in culture bottles   Culture   Final    NO GROWTH 2 DAYS Performed at [facility], [place] [address] [place], [place] [address]    Report Status PENDING  Incomplete  Culture, blood (Routine X 2) w Reflex to ID Panel     Status: None (Preliminary result)   Collection Time: 03/19/19  4:10 PM   Specimen: BLOOD RIGHT HAND  Result Value Ref Range Status   Specimen Description BLOOD RIGHT HAND  Final   Special Requests   Final    BOTTLES DRAWN AEROBIC AND ANAEROBIC Blood Culture results may not be optimal due to an inadequate volume of blood received in culture bottles   Culture   Final    NO GROWTH 2 DAYS Performed at [facility], [place] [address], [place], [place] [address]    Report Status PENDING  Incomplete     Terri Piedra, NP [facility] Pager  03/21/2019  11:58 AM

## 2019-03-21 NOTE — Discharge Instructions (Addendum)
Information on my medicine - Coumadin   (Warfarin)  This medication education was reviewed with me or my healthcare representative as part of my discharge preparation.    Why was Coumadin prescribed for you? Coumadin was prescribed for you because you have a blood clot or a medical condition that can cause an increased risk of forming blood clots. Blood clots can cause serious health problems by blocking the flow of blood to the heart, lung, or brain. Coumadin can prevent harmful blood clots from forming. As a reminder your indication for Coumadin is:   Blood Clot Prevention After Heart Valve Surgery and stroke prevention because of atrial fibrillation.  What test will check on my response to Coumadin? While on Coumadin (warfarin) you will need to have an INR test regularly to ensure that your dose is keeping you in the desired range. The INR (international normalized ratio) number is calculated from the result of the laboratory test called prothrombin time (PT).  If an INR APPOINTMENT HAS NOT ALREADY BEEN MADE FOR YOU please schedule an appointment to have this lab work done by your health care provider within 7 days. Your INR goal is usually a number between:  2 to 3 or your provider may give you a more narrow range like 2-2.5.  Ask your health care provider during an office visit what your goal INR is.  What  do you need to  know  About  COUMADIN? Take Coumadin (warfarin) exactly as prescribed by your healthcare provider about the same time each day.  DO NOT stop taking without talking to the doctor who prescribed the medication.  Stopping without other blood clot prevention medication to take the place of Coumadin may increase your risk of developing a new clot or stroke.  Get refills before you run out.  What do you do if you miss a dose? If you miss a dose, take it as soon as you remember on the same day then continue your regularly scheduled regimen the next day.  Do not take two doses of  Coumadin at the same time.  Important Safety Information A possible side effect of Coumadin (Warfarin) is an increased risk of bleeding. You should call your healthcare provider right away if you experience any of the following: ? Bleeding from an injury or your nose that does not stop. ? Unusual colored urine (red or dark brown) or unusual colored stools (red or black). ? Unusual bruising for unknown reasons. ? A serious fall or if you hit your head (even if there is no bleeding).  Some foods or medicines interact with Coumadin (warfarin) and might alter your response to warfarin. To help avoid this: ? Eat a balanced diet, maintaining a consistent amount of Vitamin K. ? Notify your provider about major diet changes you plan to make. ? Avoid alcohol or limit your intake to 1 drink for women and 2 drinks for men per day. (1 drink is 5 oz. wine, 12 oz. beer, or 1.5 oz. liquor.)  Make sure that ANY health care provider who prescribes medication for you knows that you are taking Coumadin (warfarin).  Also make sure the healthcare provider who is monitoring your Coumadin knows when you have started a new medication including herbals and non-prescription products.  Coumadin (Warfarin)  Major Drug Interactions  Increased Warfarin Effect Decreased Warfarin Effect  Alcohol (large quantities) Antibiotics (esp. Septra/Bactrim, Flagyl, Cipro) Amiodarone (Cordarone) Aspirin (ASA) Cimetidine (Tagamet) Megestrol (Megace) NSAIDs (ibuprofen, naproxen, etc.) Piroxicam (Feldene) Propafenone (Rythmol SR)  Propranolol (Inderal) Isoniazid (INH) Posaconazole (Noxafil) Barbiturates (Phenobarbital) Carbamazepine (Tegretol) Chlordiazepoxide (Librium) Cholestyramine (Questran) Griseofulvin Oral Contraceptives Rifampin Sucralfate (Carafate) Vitamin K   Coumadin (Warfarin) Major Herbal Interactions  Increased Warfarin Effect Decreased Warfarin Effect  Garlic Ginseng Ginkgo biloba Coenzyme Q10 Green  tea St. John's wort    Coumadin (Warfarin) FOOD Interactions  Eat a consistent number of servings per week of foods HIGH in Vitamin K (1 serving =  cup)  Collards (cooked, or boiled & drained) Kale (cooked, or boiled & drained) Mustard greens (cooked, or boiled & drained) Parsley *serving size only =  cup Spinach (cooked, or boiled & drained) Swiss chard (cooked, or boiled & drained) Turnip greens (cooked, or boiled & drained)  Eat a consistent number of servings per week of foods MEDIUM-HIGH in Vitamin K (1 serving = 1 cup)  Asparagus (cooked, or boiled & drained) Broccoli (cooked, boiled & drained, or raw & chopped) Brussel sprouts (cooked, or boiled & drained) *serving size only =  cup Lettuce, raw (green leaf, endive, romaine) Spinach, raw Turnip greens, raw & chopped   These websites have more information on Coumadin (warfarin):  [employer]se; VeganReport.com.au;   Additional discharge instructions  Please get your medications reviewed and adjusted by your Primary MD.  Please request your Primary MD to go over all Hospital Tests and Procedure/Radiological results at the follow up, please get all Hospital records sent to your Prim MD by signing hospital release before you go home.  If you had Pneumonia of Lung problems at the Hospital: Please get a 2 view Chest X ray done in 6-8 weeks after hospital discharge or sooner if instructed by your Primary MD.  If you have Congestive Heart Failure: Please call your Cardiologist or Primary MD anytime you have any of the following symptoms:  1) 3 pound weight gain in 24 hours or 5 pounds in 1 week  2) shortness of breath, with or without a dry hacking cough  3) swelling in the hands, feet or stomach  4) if you have to sleep on extra pillows at night in order to breathe  Follow cardiac low salt diet and 1.5 lit/day fluid restriction.  If you have diabetes Accuchecks 4 times/day, Once in AM empty stomach  and then before each meal. Log in all results and show them to your primary doctor at your next visit. If any glucose reading is under 80 or above 300 call your primary MD immediately.  If you have Seizure/Convulsions/Epilepsy: Please do not drive, operate heavy machinery, participate in activities at heights or participate in high speed sports until you have seen by Primary MD or a Neurologist and advised to do so again.  If you had Gastrointestinal Bleeding: Please ask your Primary MD to check a complete blood count within one week of discharge or at your next visit. Your endoscopic/colonoscopic biopsies that are pending at the time of discharge, will also need to followed by your Primary MD.  Get Medicines reviewed and adjusted. Please take all your medications with you for your next visit with your Primary MD  Please request your Primary MD to go over all hospital tests and procedure/radiological results at the follow up, please ask your Primary MD to get all Hospital records sent to his/her office.  If you experience worsening of your admission symptoms, develop shortness of breath, life threatening emergency, suicidal or homicidal thoughts you must seek medical attention immediately by calling 911 or calling your MD immediately  if symptoms less severe.  You must read complete instructions/literature along with all the possible adverse reactions/side effects for all the Medicines you take and that have been prescribed to you. Take any new Medicines after you have completely understood and accpet all the possible adverse reactions/side effects.   Do not drive or operate heavy machinery when taking Pain medications.   Do not take more than prescribed Pain, Sleep and Anxiety Medications  Special Instructions: If you have smoked or chewed Tobacco  in the last 2 yrs please stop smoking, stop any regular Alcohol  and or any Recreational drug use.  Wear Seat belts while driving.  Please  note You were cared for by a hospitalist during your hospital stay. If you have any questions about your discharge medications or the care you received while you were in the hospital after you are discharged, you can call the unit and asked to speak with the hospitalist on call if the hospitalist that took care of you is not available. Once you are discharged, your primary care physician will handle any further medical issues. Please note that NO REFILLS for any discharge medications will be authorized once you are discharged, as it is imperative that you return to your primary care physician (or establish a relationship with a primary care physician if you do not have one) for your aftercare needs so that they can reassess your need for medications and monitor your lab values.  You can reach the hospitalist office at phone [phone] or fax [phone]   If you do not have a primary care physician, you can call [phone] for a physician referral.

## 2019-03-22 ENCOUNTER — Inpatient Hospital Stay: Payer: Self-pay

## 2019-03-22 DIAGNOSIS — Z950 Presence of cardiac pacemaker: Secondary | ICD-10-CM

## 2019-03-22 DIAGNOSIS — L0390 Cellulitis, unspecified: Secondary | ICD-10-CM

## 2019-03-22 DIAGNOSIS — R7881 Bacteremia: Secondary | ICD-10-CM

## 2019-03-22 DIAGNOSIS — Z888 Allergy status to other drugs, medicaments and biological substances status: Secondary | ICD-10-CM

## 2019-03-22 DIAGNOSIS — Z952 Presence of prosthetic heart valve: Secondary | ICD-10-CM

## 2019-03-22 DIAGNOSIS — B954 Other streptococcus as the cause of diseases classified elsewhere: Secondary | ICD-10-CM

## 2019-03-22 LAB — BASIC METABOLIC PANEL
Anion gap: 15 (ref 5–15)
BUN: 81 mg/dL — ABNORMAL HIGH (ref 8–23)
CO2: 25 mmol/L (ref 22–32)
Calcium: 8.8 mg/dL — ABNORMAL LOW (ref 8.9–10.3)
Chloride: 94 mmol/L — ABNORMAL LOW (ref 98–111)
Creatinine, Ser: 2 mg/dL — ABNORMAL HIGH (ref 0.61–1.24)
GFR calc Af Amer: 38 mL/min — ABNORMAL LOW (ref >60)
GFR calc non Af Amer: 33 mL/min — ABNORMAL LOW (ref >60)
Glucose, Bld: 111 mg/dL — ABNORMAL HIGH (ref 70–99)
Potassium: 3.6 mmol/L (ref 3.5–5.1)
Sodium: 134 mmol/L — ABNORMAL LOW (ref 135–145)

## 2019-03-22 LAB — PROTIME-INR
INR: 2.6 — ABNORMAL HIGH (ref 0.8–1.2)
Prothrombin Time: 27.5 seconds — ABNORMAL HIGH (ref 11.4–15.2)

## 2019-03-22 LAB — CBC
HCT: 27.5 % — ABNORMAL LOW (ref 39.0–52.0)
Hemoglobin: 9 g/dL — ABNORMAL LOW (ref 13.0–17.0)
MCH: 31.9 pg (ref 26.0–34.0)
MCHC: 32.7 g/dL (ref 30.0–36.0)
MCV: 97.5 fL (ref 80.0–100.0)
Platelets: 230 10*3/uL (ref 150–400)
RBC: 2.82 MIL/uL — ABNORMAL LOW (ref 4.22–5.81)
RDW: 17.2 % — ABNORMAL HIGH (ref 11.5–15.5)
WBC: 11.1 10*3/uL — ABNORMAL HIGH (ref 4.0–10.5)
nRBC: 0.3 % — ABNORMAL HIGH (ref 0.0–0.2)

## 2019-03-22 LAB — HEPARIN LEVEL (UNFRACTIONATED): Heparin Unfractionated: 0.49 IU/mL (ref 0.30–0.70)

## 2019-03-22 MED ORDER — WARFARIN SODIUM 10 MG PO TABS
10.0000 mg | ORAL_TABLET | Freq: Once | ORAL | Status: AC
  Administered 2019-03-22: 10 mg via ORAL
  Filled 2019-03-22: qty 1

## 2019-03-22 NOTE — Progress Notes (Signed)
[facility]  PROGRESS NOTE  Frank [name] UVO:[id number] DOB: Aug 18, 1945 DOA: 03/16/2019 PCP: [name], MD Admit date - 03/16/2019   Admitting Physician [name], MD  Outpatient Primary MD for the patient is [name], MD  LOS - 5 Brief Narrative   Frank [name] is a 73 y.o. 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  In the ED found to be febrile to 103, tachypneic, hypoxic to 86% on room air requiring 2 L, WBC 29, lactic acid 2.8, creatinine 2.5, fecal occult positive, Covid test negative.  UA remarkable for large leukocytes, greater than 50 WBCs, rare bacteria Chest x-ray showed cardiomegaly with mild volume overload. 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  TTE and TEE negative, currently awaiting TEE, continuing penicillin G as recommended by ID.  Subjective  Mr. Gavigan today reports   A & P     Group G strep bacteremia and RLE nonpurulent cellulitis stable TTE/TEE are both negative for PV endocarditis, remains afebrile, leukocytosis improving, tenderness to right ankle improving, x-ray findings consistent with soft tissue infection  -Repeat blood cultures are negative  -Continue IV  penicillin G continuous x 14 days ( end date 04/01/19) per ID recommendations (2 g IV ceftriaxone is an option if unable to tolerate)  --Plan for PICC   Sepsis, resolved. Likely related to group G strep bacteremia.  Initially admitted  for possible UTI given large leuks but asymptomatic and no bacteria.  Currently afebrile,White count downtrending, BP better.   -Continue IV penicillin G x 14 days as mentioned above  Nonoliguric AKI on CKD stage III with hyperkalemia, improving. Likely related to hypotension related to sepsis/bacteremia.  Peak creatinine of 3.65, now 2, baseline 1.2-1.6.  UA unremarkable still making good urine, potassium wnl after lokelma  -Monitor urine output, follow BMP, avoid nephrotoxins  -If creatinine worsens or urine output decreases will obtain renal ultrasound formally consult  nephrology, anticipate improvement with IV fluids  -Hold home losartan          Acute hypoxic respiratory failure, resolved.  Likely resultant of acute exacerbation of CHF in  the setting of infection/bacteremia.  Improving as patient is currently room air down from 2 L  -Now tolerating IV Lasix and maintaining normal blood pressure  - closely monitor weights, output   Hypotension, improving.  Likely related to sepsis/bacteremia. Had worsening drop with IV diuretics  -DC IVF given improvement in BP and avoid worsening CHF  -Closely monitor on IV diuretics  Acute on chronicCombined systolic/diastolic CHF.    CXR with mild volume overload, currently on room air,  has significant B/l edema, R>L that has improved with  IV Lasix diuresis.  -2.7 L in 24 hrst, previously was holding diuresis given hypotension but maintaining normal BP now   -Continue IV Lasix 40 mg twice daily, monitor output and blood pressure, (home regimen torsemide 60 mg twice daily)  -Monitor ins and outs, daily weights,   -Holding off on home metolazone/spironolactone, due to previous hypotension        Mechanical mitral valve/aortic valve.  Supratherapeutic on admission requiring vitamin K,             currently 2.2 will need to hold warfarin prior to TEE  -bridging back to coumadin, on heparin gtt, daily INR goal (2.5-3.5)  - heparin per pharmacy  protocol        Anemia secondary to iron deficiency.  Goal hemoglobin greater than 8 given significant  cardiac history  -Hold off on further iron supplementation in setting of concurrent infection         Paroxysmal atrial fibrillation, complete heart block status post pacemaker.  Currently                rate controlled in NSR.  -Followed by Dr. [name] as outpatient continue to closely monitor  --bridging back to coumadin, on heparin gtt        Chronic right lower leg edema.  Venous duplex negative for DVT  -Continue to monitor        Rectal bleeding.  Likely due to known diagnosis of hemorrhoids in the setting of supratherapeutic INR.  Currently stable, hemoglobin has slowly down trended.   -Has not had any further episodes during hospital stay, continue to monitor      Family Communication  : no family at bedside, will call wife to update Code Status : Full code  Disposition Plan  : Continue IV penicillin G, needs PICC Consults  : Cardiology, ID  Procedures  : TTE (12/9), TEE 12/11  DVT Prophylaxis  : Heparin, coumadin Lab Results  Component Value Date   PLT 230 03/22/2019    Diet :  Diet Order            Diet regular Room service appropriate? Yes; Fluid consistency: Thin  Diet effective now               Inpatient Medications Scheduled Meds: . furosemide  40 mg Intravenous BID  . psyllium  1 packet Oral Daily  . warfarin  10 mg Oral ONCE-1800  . Warfarin - Pharmacist Dosing Inpatient   Does not apply q1800   Continuous Infusions: . heparin 1,900 Units/hr (03/21/19 2315)  . penicillin g continuous IV infusion 8 Million Units (03/21/19 2313)   PRN Meds:.acetaminophen, HYDROcodone-acetaminophen, traMADol, witch hazel-glycerin  Antibiotics  :   Anti-infectives (From admission, onward)   Start     Dose/Rate Route Frequency Ordered Stop   03/20/19 1200  penicillin G potassium 8 Million Units in dextrose 5 % 500 mL continuous infusion     8 Million Units 41.7  mL/hr over 12 Hours Intravenous Every 12 hours 03/20/19 0945     03/17/19 2200  cefTRIAXone (ROCEPHIN) 1 g in sodium chloride 0.9 % 100 mL IVPB  Status:  Discontinued     1 g 200 mL/hr over 30 Minutes Intravenous Every 24 hours 03/17/19 0240 03/17/19 1536   03/17/19 2200  cefTRIAXone (ROCEPHIN) 2 g in sodium chloride 0.9 % 100 mL IVPB  Status:  Discontinued     2 g 200 mL/hr over 30 Minutes Intravenous Every 24 hours 03/17/19 1536 03/20/19 0945   03/17/19 0130  cefTRIAXone (ROCEPHIN) 1 g in sodium chloride 0.9 % 100 mL IVPB     1 g 200 mL/hr over 30 Minutes Intravenous  Once 03/17/19 0123 03/17/19 0214       Objective   Vitals:   03/21/19 1354 03/21/19 2032 03/21/19 2320 03/22/19 0433  BP: (!) 111/45 (!) 108/48 (!) 120/54 (!) 114/56  Pulse: 62 69 69 71  Resp: 12 20 19 18   Temp:  97.6 F (36.4 C) 97.7 F (36.5 C) 97.6 F (36.4 C)  TempSrc:  Oral Oral Oral  SpO2: 94% 93% 96% 97%  Weight:    (!) 154.6 kg  Height:        SpO2: 97 % O2 Flow Rate (L/min): 2 L/min  Wt Readings from Last 3 Encounters:  03/22/19 (!) 154.6 kg  02/28/19 (!) 150.1 kg  02/14/19 (!) 156 kg     Intake/Output Summary (Last 24 hours) at 03/22/2019 1051 Last data filed at 03/22/2019 0455 Gross per 24 hour  Intake 2105.63 ml  Output 2620 ml  Net -514.37 ml    Physical Exam:  Awake Alert, Oriented X 3, Normal affect No new F.N deficits,  [place].AT, No JVD appreciated Symmetrical Chest wall movement, Good air movement bilaterally, CTAB on room air RRR,systolic click and murmur heard +ve B.Sounds, Abd Soft, No tenderness, No organomegaly appreciated, No rebound, guarding or rigidity. Right leg 2+ edema to above thigh, circumferential redness and warmth without tenderness on calf, left leg 1 + edema to above thigh. Right ankle noticeably less tender on palpation       I have personally reviewed the following:   Data Reviewed:  CBC Recent Labs  Lab 03/16/19 2359 03/18/19 0310  03/19/19 0220 03/19/19 2315 03/20/19 0430 03/21/19 0250 03/22/19 0236  WBC 29.8* 36.1* 24.6*  --  15.9* 12.6* 11.1*  HGB 8.9* 7.9* 7.5* 8.7* 8.5* 8.3* 9.0*  HCT 28.7* 24.3* 23.3* 26.8* 26.0* 25.2* 27.5*  PLT 270 195 202  --  216 207 230  MCV 101.8* 98.8 98.3  --  98.1 96.6 97.5  MCH 31.6 32.1 31.6  --  32.1 31.8 31.9  MCHC 31.0 32.5 32.2  --  32.7 32.9 32.7  RDW 17.0* 17.0* 17.0*  --  17.6* 17.2* 17.2*  LYMPHSABS 0.7  --   --   --   --   --   --   MONOABS 2.0*  --   --   --   --   --   --   EOSABS 0.0  --   --   --   --   --   --   BASOSABS 0.1  --   --   --   --   --   --     Chemistries  Recent Labs  Lab 03/19/19 0220 03/19/19 0815 03/20/19 0430 03/20/19 0745 03/20/19 1140 03/21/19 0250 03/22/19 0236  NA 128* 129* 130*  --   --  132* 134*  K 5.5* 5.2* 4.8 4.8 5.4* 3.8 3.6  CL 92* 92* 93*  --   --  95* 94*  CO2 23 21* 23  --   --  23 25  GLUCOSE 94 107* 87  --   --  91 111*  BUN 92* 91* 90*  --   --  87* 81*  CREATININE 3.65* 3.51* 2.83*  --   --  2.33* 2.00*  CALCIUM 8.7* 8.7* 8.7*  --   --  8.8* 8.8*   ------------------------------------------------------------------------------------------------------------------ No results for input(s): CHOL, HDL, LDLCALC, TRIG, CHOLHDL, LDLDIRECT in the last 72 hours.  Lab Results  Component Value Date   HGBA1C 5.7 (H) 09/18/2017   ------------------------------------------------------------------------------------------------------------------ No results for input(s): TSH, T4TOTAL, T3FREE, THYROIDAB in the last 72 hours.  Invalid input(s): FREET3 ------------------------------------------------------------------------------------------------------------------ No results for input(s): VITAMINB12, FOLATE, FERRITIN, TIBC, IRON, RETICCTPCT in the last 72 hours.  Coagulation profile Recent Labs  Lab 03/18/19  0310 03/19/19 0220 03/20/19 0430 03/21/19 0250 03/22/19 0236  INR 6.3* 2.7* 2.2* 2.3* 2.6*    No results for  input(s): DDIMER in the last 72 hours.  Cardiac Enzymes No results for input(s): CKMB, TROPONINI, MYOGLOBIN in the last 168 hours.  Invalid input(s): CK ------------------------------------------------------------------------------------------------------------------    Component Value Date/Time   BNP 105.7 (H) 03/16/2019 2359   BNP 31.3 07/28/2015 0829    Micro Results Recent Results (from the past 240 hour(s))  Blood culture (routine x 2)     Status: Abnormal   Collection Time: 03/16/19 11:59 PM   Specimen: BLOOD  Result Value Ref Range Status   Specimen Description BLOOD RIGHT HAND  Final   Special Requests   Final    BOTTLES DRAWN AEROBIC AND ANAEROBIC Blood Culture results may not be optimal due to an inadequate volume of blood received in culture bottles   Culture  Setup Time   Final    GRAM POSITIVE COCCI IN CHAINS AEROBIC BOTTLE ONLY CRITICAL RESULT CALLED TO, READ BACK BY AND VERIFIED WITH: E. SINCLAIR, PHARMD AT 2751 ON 03/17/19 BY C. JESSUP, MT.    Culture (A)  Final    STREPTOCOCCUS GROUP G SUSCEPTIBILITIES PERFORMED ON PREVIOUS CULTURE WITHIN THE LAST 5 DAYS. Performed at [facility], [place] [address], [place], [place] [address]    Report Status 03/19/2019 FINAL  Final  Blood culture (routine x 2)     Status: Abnormal   Collection Time: 03/16/19 11:59 PM   Specimen: BLOOD  Result Value Ref Range Status   Specimen Description BLOOD LEFT HAND  Final   Special Requests   Final    BOTTLES DRAWN AEROBIC AND ANAEROBIC Blood Culture adequate volume   Culture  Setup Time   Final    GRAM POSITIVE COCCI IN CHAINS IN BOTH AEROBIC AND ANAEROBIC BOTTLES CRITICAL RESULT CALLED TO, READ BACK BY AND VERIFIED WITHBronwen Betters [facility] 4944 03/17/19 A BROWNING Performed at [facility], [place] [address] [place], [place] [address]    Culture STREPTOCOCCUS GROUP G (A)  Final   Report Status 03/19/2019 FINAL  Final   Organism ID, Bacteria STREPTOCOCCUS GROUP G  Final       Susceptibility   Streptococcus group g - MIC*    CLINDAMYCIN <=0.25 SENSITIVE Sensitive     AMPICILLIN <=0.25 SENSITIVE Sensitive     ERYTHROMYCIN <=0.12 SENSITIVE Sensitive     VANCOMYCIN 0.5 SENSITIVE Sensitive     CEFTRIAXONE <=0.12 SENSITIVE Sensitive     LEVOFLOXACIN 0.5 SENSITIVE Sensitive     PENICILLIN Value in next row Sensitive      SENSITIVE<=0.06    * STREPTOCOCCUS GROUP G  Blood Culture ID Panel (Reflexed)     Status: Abnormal   Collection Time: 03/16/19 11:59 PM  Result Value Ref Range Status   Enterococcus species NOT DETECTED NOT DETECTED Final   Listeria monocytogenes NOT DETECTED NOT DETECTED Final   Staphylococcus species NOT DETECTED NOT DETECTED Final   Staphylococcus aureus (BCID) NOT DETECTED NOT DETECTED Final   Streptococcus species DETECTED (A) NOT DETECTED Final    Comment: Not Enterococcus species, Streptococcus agalactiae, Streptococcus pyogenes, or Streptococcus pneumoniae. CRITICAL RESULT CALLED TO, READ BACK BY AND VERIFIED WITH: L CURRAN PHARMD 1741 03/17/19 A BROWNING    Streptococcus agalactiae NOT DETECTED NOT DETECTED Final   Streptococcus pneumoniae NOT DETECTED NOT DETECTED Final   Streptococcus pyogenes NOT DETECTED NOT DETECTED Final   Acinetobacter baumannii NOT DETECTED NOT DETECTED Final  Enterobacteriaceae species NOT DETECTED NOT DETECTED Final   Enterobacter cloacae complex NOT DETECTED NOT DETECTED Final   Escherichia coli NOT DETECTED NOT DETECTED Final   Klebsiella oxytoca NOT DETECTED NOT DETECTED Final   Klebsiella pneumoniae NOT DETECTED NOT DETECTED Final   Proteus species NOT DETECTED NOT DETECTED Final   Serratia marcescens NOT DETECTED NOT DETECTED Final   Haemophilus influenzae NOT DETECTED NOT DETECTED Final   Neisseria meningitidis NOT DETECTED NOT DETECTED Final   Pseudomonas aeruginosa NOT DETECTED NOT DETECTED Final   Candida albicans NOT DETECTED NOT DETECTED Final   Candida glabrata NOT DETECTED NOT DETECTED Final    Candida krusei NOT DETECTED NOT DETECTED Final   Candida parapsilosis NOT DETECTED NOT DETECTED Final   Candida tropicalis NOT DETECTED NOT DETECTED Final    Comment: Performed at [facility], [place] [address] [place], [place] [address]  SARS CORONAVIRUS 2 (TAT 6-24 HRS) Nasopharyngeal Nasopharyngeal Swab     Status: None   Collection Time: 03/17/19 12:32 AM   Specimen: Nasopharyngeal Swab  Result Value Ref Range Status   SARS Coronavirus 2 NEGATIVE NEGATIVE Final    Comment: (NOTE) SARS-CoV-2 target nucleic acids are NOT DETECTED. The SARS-CoV-2 RNA is generally detectable in upper and lower respiratory specimens during the acute phase of infection. Negative results do not preclude SARS-CoV-2 infection, do not rule out co-infections with other pathogens, and should not be used as the sole basis for treatment or other patient management decisions. Negative results must be combined with clinical observations, patient history, and epidemiological information. The expected result is Negative. Fact Sheet for Patients: SugarRoll.be Fact Sheet for Healthcare Providers: https://www.woods-mathews.com/ This test is not yet approved or cleared by the [place] FDA and  has been authorized for detection and/or diagnosis of SARS-CoV-2 by FDA under an Emergency Use Authorization (EUA). This EUA will remain  in effect (meaning this test can be used) for the duration of the COVID-19 declaration under Section 56 4(b)(1) of the Act, 21 U.S.C. section 360bbb-3(b)(1), unless the authorization is terminated or revoked sooner. Performed at [facility], [place] [address], [place], [place] [address]   Urine culture     Status: Abnormal   Collection Time: 03/17/19 12:54 AM   Specimen: Urine, Random  Result Value Ref Range Status   Specimen Description URINE, RANDOM  Final   Special Requests   Final    NONE Performed at [facility],  [place] [address] [place], [place] [address]    Culture MULTIPLE SPECIES PRESENT, SUGGEST RECOLLECTION (A)  Final   Report Status 03/18/2019 FINAL  Final  Culture, blood (Routine X 2) w Reflex to ID Panel     Status: None (Preliminary result)   Collection Time: 03/19/19  4:02 PM   Specimen: BLOOD RIGHT HAND  Result Value Ref Range Status   Specimen Description BLOOD RIGHT HAND  Final   Special Requests   Final    BOTTLES DRAWN AEROBIC AND ANAEROBIC Blood Culture results may not be optimal due to an inadequate volume of blood received in culture bottles   Culture   Final    NO GROWTH 3 DAYS Performed at [facility], [place] [address] [place],  [address]    Report Status PENDING  Incomplete  Culture, blood (Routine X 2) w Reflex to ID Panel     Status: None (Preliminary result)   Collection Time: 03/19/19  4:10 PM   Specimen: BLOOD RIGHT HAND  Result Value  Ref Range Status   Specimen Description BLOOD RIGHT HAND  Final   Special Requests   Final    BOTTLES DRAWN AEROBIC AND ANAEROBIC Blood Culture results may not be optimal due to an inadequate volume of blood received in culture bottles   Culture   Final    NO GROWTH 3 DAYS Performed at [facility], [place] [address], [place], [place] [address]    Report Status PENDING  Incomplete    Radiology Reports DG Chest Port 1 View  Result Date: 03/16/2019 CLINICAL DATA:  Shortness of breath EXAM: PORTABLE CHEST 1 VIEW COMPARISON:  July 10, 2018 FINDINGS: The heart size is significantly enlarged. The patient is status post prior median sternotomy. Aortic calcifications are noted. There is a multi lead right-sided pacemaker in place. There is vascular congestion without overt pulmonary edema. There is no pneumothorax or large pleural effusion. There is no acute osseous abnormality. IMPRESSION: Cardiomegaly with mild volume overload. Electronically Signed   By: Constance Holster M.D.   On: 03/16/2019 23:50   DG Ankle Right  Port  Result Date: 03/20/2019 CLINICAL DATA:  Right ankle pain., Redness and swelling. EXAM: PORTABLE RIGHT ANKLE - 2 VIEW COMPARISON:  None. FINDINGS: There is no acute bony or joint abnormality. No soft tissue gas or radiopaque foreign body. Soft tissues are diffusely swollen. Atherosclerosis noted. No radiopaque foreign body. IMPRESSION: Diffuse soft tissue swelling about the ankle could be due to dependent change or cellulitis. The exam is otherwise negative. Electronically Signed   By: Inge Rise M.D.   On: 03/20/2019 14:51   ECHOCARDIOGRAM COMPLETE  Result Date: 03/19/2019   ECHOCARDIOGRAM REPORT   Patient Name:   Frank [name] Glas Date of Exam: 03/19/2019 Medical Rec #:  [id number]       Height:       71.0 in Accession #:    [id number]      Weight:       345.7 lb Date of Birth:  1945-10-30      BSA:          2.66 m Patient Age:    72 years        BP:           99/43 mmHg Patient Gender: M               HR:           60 bpm. Exam Location:  Inpatient Procedure: 2D Echo, Cardiac Doppler, Color Doppler and Intracardiac            Opacification Agent                            MODIFIED REPORT: This report was modified by Eleonore Chiquito MD on 03/19/2019 due to error.  Indications:     Bacteremia.  History:         Patient has prior history of Echocardiogram examinations, most                  recent 02/14/2019. Non- ischemic cardiomyopathy and CHF,                  Pacemaker and Abnormal ECG, TIA and Pulmonary HTN, Mitral Valve                  Disease, Arrythmias:Complete heart block; Risk                  Factors:Hypertension.  Mitral Valve: mechanical valve is present                  in the mitral position.  Sonographer:     Roseanna Rainbow RDCS Referring Phys:  [id number] [name] Diagnosing Phys: Eleonore Chiquito MD  Sonographer Comments: Technically difficult study due to poor echo windows, Technically challenging study due to limited acoustic windows, patient is morbidly obese, suboptimal subcostal window,  suboptimal apical window and suboptimal parasternal window.  Image acquisition challenging due to patient body habitus. IMPRESSIONS  1. No evidence of infective endocarditis on this study. A TEE is recommended to exclude endocarditis.  2. The patient is s/p myomectomy with mechanical MVR and AoV repair (reported in record). Overall, the EF remains unchanged with prior. The mechanical MV prosthesis is functioning normally. The AoV is severely calcified with restricted movement. I suspect there is at least mild to moderate aortic stenosis, but doppler interrogation on this study is suboptimal. The tricuspid regurgitation on this study is severe with systolic hepatic vein flow reversal (not well evaluated on last study). Findings compared directly with study from 02/14/2019.  3. Left ventricular ejection fraction, by visual estimation, is 55 to 60%. The left ventricle has normal function. There is mildly increased left ventricular hypertrophy.  4. Definity contrast agent was given IV to delineate the left ventricular endocardial borders.  5. Left ventricular diastolic function could not be evaluated.  6. Right ventricular volume overload.  7. The left ventricle has no regional wall motion abnormalities.  8. Global right ventricle has severely reduced systolic function.The right ventricular size is severely enlarged. No increase in right ventricular wall thickness.  9. Left atrial size was mild-moderately dilated. 10. Right atrial size was severely dilated. 11. The mitral valve has been repaired/replaced. No evidence of mitral valve regurgitation. 12. Mechanical mitral valve present. Mean gradient 5 mmHG with heart rate 60 bpm. EOA 5 mmHG. Normal functioning prosthetic valve with stable gradients compared with prior. 13. The tricuspid valve is grossly normal. Tricuspid valve regurgitation is severe. 14. Aortic valve regurgitation is mild. Mild to moderate aortic valve stenosis. 15. The aortic valve is severely calcified  with restricted leaflet motion. Doppler interrogation is poor on this study. Vmax 2.5 m/s, Mean gradient ~8.5 mmHG, but suspect this is severely underestimated. There is likely a mild to moderate degree of aortic stenosis present. 16. The pulmonic valve was grossly normal. Pulmonic valve regurgitation is not visualized. 17. Mild plaque invoving the aortic root. 18. Normal pulmonary artery systolic pressure. 19. The tricuspid regurgitant velocity is 1.76 m/s, and with an assumed right atrial pressure of 15 mmHg, the estimated right ventricular systolic pressure is normal at 27.4 mmHg. 20. A pacer wire is visualized in the RA and RV. FINDINGS  Left Ventricle: Left ventricular ejection fraction, by visual estimation, is 55 to 60%. The left ventricle has normal function. Definity contrast agent was given IV to delineate the left ventricular endocardial borders. The left ventricle has no regional wall motion abnormalities. There is mildly increased left ventricular hypertrophy. Concentric left ventricular hypertrophy. The interventricular septum is flattened in diastole ('D' shaped left ventricle), consistent with right ventricular volume overload. The left ventricular diastology could not be evaluated due to mitral valve replacement/repair. Left ventricular diastolic function could not be evaluated. Right Ventricle: The right ventricular size is severely enlarged. No increase in right ventricular wall thickness. Global RV systolic function is has severely reduced systolic function. The tricuspid regurgitant velocity is 1.76 m/s, and with an  assumed right atrial pressure of 15 mmHg, the estimated right ventricular systolic pressure is normal at 27.4 mmHg. Left Atrium: Left atrial size was mild-moderately dilated. Right Atrium: Right atrial size was severely dilated Pericardium: There is no evidence of pericardial effusion. Mitral Valve: The mitral valve has been repaired/replaced. No evidence of mitral valve  regurgitation. MV peak gradient, 19.1 mmHg. Mechanical mitral valve present. Mean gradient 5 mmHG with heart rate 60 bpm. EOA 5 mmHG. Normal functioning prosthetic valve with stable gradients compared with prior. Tricuspid Valve: The tricuspid valve is grossly normal. Tricuspid valve regurgitation is severe. The flow in the hepatic veins is reversed during ventricular systole. Aortic Valve: The aortic valve has been repaired/replaced. Aortic valve regurgitation is mild. Mild to moderate aortic stenosis is present. Aortic valve mean gradient measures 8.5 mmHg. Aortic valve peak gradient measures 16.2 mmHg. Aortic valve area, by  VTI measures 2.40 cm. The aortic valve is severely calcified with restricted leaflet motion. Doppler interrogation is poor on this study. Vmax 2.5 m/s, Mean gradient ~8.5 mmHG, but suspect this is severely underestimated. There is likely a mild to moderate degree of aortic stenosis present. Pulmonic Valve: The pulmonic valve was grossly normal. Pulmonic valve regurgitation is not visualized. Pulmonic regurgitation is not visualized. Aorta: The aortic root is normal in size and structure. There is mild, layered plaque involving the aortic root. IAS/Shunts: No atrial level shunt detected by color flow Doppler. Additional Comments: A pacer wire is visualized in the right atrium and right ventricle.  LEFT VENTRICLE PLAX 2D LVIDd:         4.10 cm       Diastology LVIDs:         3.00 cm       LV e' lateral:   11.70 cm/s LV PW:         2.10 cm       LV E/e' lateral: 12.3 LV IVS:        1.70 cm       LV e' medial:    8.59 cm/s LVOT diam:     2.20 cm       LV E/e' medial:  16.8 LV SV:         39 ml LV SV Index:   13.60 LVOT Area:     3.80 cm  LV Volumes (MOD) LV area d, A2C:    33.90 cm LV area d, A4C:    38.70 cm LV area s, A2C:    23.20 cm LV area s, A4C:    25.93 cm LV major d, A2C:   7.96 cm LV major d, A4C:   8.64 cm LV major s, A2C:   7.74 cm LV major s, A4C:   8.04 cm LV vol d, MOD A2C:  120.0 ml LV vol d, MOD A4C: 147.4 ml LV vol s, MOD A2C: 58.2 ml LV vol s, MOD A4C: 71.6 ml LV SV MOD A2C:     61.8 ml LV SV MOD A4C:     147.4 ml LV SV MOD BP:      71.1 ml RIGHT VENTRICLE            IVC RV S prime:     8.81 cm/s  IVC diam: 4.70 cm TAPSE (M-mode): 1.5 cm LEFT ATRIUM            Index       RIGHT ATRIUM           Index LA diam:  6.40 cm  2.41 cm/m  RA Area:     42.00 cm LA Vol (A2C): 108.0 ml 40.59 ml/m RA Volume:   177.00 ml 66.52 ml/m LA Vol (A4C): 147.0 ml 55.25 ml/m  AORTIC VALVE AV Area (Vmax):    2.51 cm AV Area (Vmean):   2.58 cm AV Area (VTI):     2.40 cm AV Vmax:           201.50 cm/s AV Vmean:          132.000 cm/s AV VTI:            0.419 m AV Peak Grad:      16.2 mmHg AV Mean Grad:      8.5 mmHg LVOT Vmax:         133.00 cm/s LVOT Vmean:        89.600 cm/s LVOT VTI:          0.264 m LVOT/AV VTI ratio: 0.63  AORTA Ao Root diam: 3.70 cm Ao Asc diam:  3.40 cm MITRAL VALVE                        TRICUSPID VALVE MV Area (PHT): 2.48 cm             TV Peak grad:   17.1 mmHg MV Peak grad:  19.1 mmHg            TV Mean grad:   5.0 mmHg MV Mean grad:  5.0 mmHg             TV Vmax:        2.07 m/s MV Vmax:       2.18 m/s             TV Vmean:       93.2 cm/s MV Vmean:      93.5 cm/s            TV VTI:         0.61 msec MV VTI:        0.54 m               TR Peak grad:   12.4 mmHg MV PHT:        88.74 msec           TR Vmax:        178.00 cm/s MV Decel Time: 306 msec MV E velocity: 144.00 cm/s 103 cm/s SHUNTS                                     Systemic VTI:  0.26 m                                     Systemic Diam: 2.20 cm  Eleonore Chiquito MD Electronically signed by Eleonore Chiquito MD Signature Date/Time: 03/19/2019/11:50:41 AM    Final (Updated)    ECHO TEE  Result Date: 03/21/2019   TRANSESOPHOGEAL ECHO REPORT   Patient Name:   Frank [name] Date of Exam: 03/21/2019 Medical Rec #:  [id number]       Height:       71.0 in Accession #:    [id number]      Weight:       341.5 lb Date of  Birth:  Jul 24, 1945  BSA:          2.65 m Patient Age:    66 years        BP:           111/45 mmHg Patient Gender: M               HR:           62 bpm. Exam Location:  Inpatient  Procedure: Transesophageal Echo Indications:    bacteremia  History:        Patient has prior history of Echocardiogram examinations, most                 recent 03/19/2019. 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  Sonographer:    Jannett Celestine RDCS (AE) Referring Phys: Cassia: Consent was requested emergently by emergency room physicain. The transesophogeal probe was passed through the esophogus of the patient. The patient's vital signs; including heart rate, blood pressure, and oxygen saturation; remained stable throughout the procedure. The patient developed no complications during the procedure. IMPRESSIONS  1. Left ventricular ejection fraction, by visual estimation, is 55 to 60%. The left ventricle has normal function. Normal left ventricular size. There is no left ventricular hypertrophy.  2. The left ventricle has no regional wall motion abnormalities.  3. Global right ventricle has normal systolic function.The right ventricular size is normal. No increase in right ventricular wall thickness.  4. Both RA and RV pacer wires are normal. No vegetations.  5. Left atrial size was moderately dilated.  6. Right atrial size was normal.  7. The mitral valve has been repaired/replaced. No evidence of mitral valve regurgitation. No evidence of mitral stenosis.  8. Mechanical mitral valve is functioning normally. No abscess. No rocking.  9. The tricuspid valve is normal in structure. Tricuspid valve regurgitation is severe. 10. The aortic valve is tricuspid. Aortic valve regurgitation is mild. Mild to moderate aortic valve sclerosis/calcification without any evidence of aortic  stenosis. 11. The pulmonic valve was normal in structure. Pulmonic valve regurgitation is mild. 12. A pacer wire is visualized. 13. The inferior vena cava is normal in size with greater than 50% respiratory variability, suggesting right atrial pressure of 3 mmHg. 14. No vegetations. No endocarditis. FINDINGS  Left Ventricle: Left ventricular ejection fraction, by visual estimation, is 55 to 60%. The left ventricle has normal function. The left ventricle has no regional wall motion abnormalities. There is no left ventricular hypertrophy. Normal left ventricular size. Normal left atrial pressure. Right Ventricle: The right ventricular size is normal. No increase in right ventricular wall thickness. Global RV systolic function is has normal systolic function. Both RA and RV pacer wires are normal. No vegetations. Left Atrium: Left atrial size was moderately dilated. Smoke noted in LA. Right Atrium: Right atrial size was normal in size Pericardium: There is no evidence of pericardial effusion. Mitral Valve: The mitral valve has been repaired/replaced. No evidence of mitral valve stenosis by observation. MV peak gradient, 18.5 mmHg. No evidence of mitral valve regurgitation. Mechanical mitral valve is functioning normally. No abscess. No rocking. Tricuspid Valve: The tricuspid valve is normal in structure. Tricuspid valve regurgitation is severe. Aortic Valve: The aortic valve is tricuspid.  Aortic valve regurgitation is mild. Mild to moderate aortic valve sclerosis/calcification is present, without any evidence of aortic stenosis. Pulmonic Valve: The pulmonic valve was normal in structure. Pulmonic valve regurgitation is mild. Aorta: The aortic root, ascending aorta and aortic arch are all structurally normal, with no evidence of dilitation or obstruction. Venous: The inferior vena cava is normal in size with greater than 50% respiratory variability, suggesting right atrial pressure of 3 mmHg. Shunts: There is no  evidence of a patent foramen ovale. No ventricular septal defect is seen or detected. There is no evidence of an atrial septal defect. No atrial level shunt detected by color flow Doppler. Additional Comments: No vegetations. No endocarditis. A pacer wire is visualized.  MITRAL VALVE             Normals MV Peak grad: 18.5 mmHg  4 mmHg MV Mean grad: 7.0 mmHg MV Vmax:      2.15 m/s MV Vmean:     125.0 cm/s MV VTI:       0.43 m  Candee Furbish MD Electronically signed by Candee Furbish MD Signature Date/Time: 03/21/2019/2:26:09 PM    Final    CUP PACEART REMOTE DEVICE CHECK  Result Date: 03/06/2019 Scheduled remote reviewed.  Normal device function.  No atrial arrhythmia detected since last remote on 02/25/2019 Next remote 91 days. Kathy Breach, RN, CCDS, [employer]  VAS [name] LOWER EXTREMITY VENOUS (DVT)  Result Date: 03/17/2019  Lower Venous Study Indications: Swelling, and Edema.  Limitations: Body habitus and poor ultrasound/tissue interface. Comparison Study: no prior Performing Technologist: Abram Sander RVS  Examination Guidelines: A complete evaluation includes B-mode imaging, spectral Doppler, color Doppler, and power Doppler as needed of all accessible portions of each vessel. Bilateral testing is considered an integral part of a complete examination. Limited examinations for reoccurring indications may be performed as noted.  +---------+---------------+---------+-----------+----------+--------------+ RIGHT    CompressibilityPhasicitySpontaneityPropertiesThrombus Aging +---------+---------------+---------+-----------+----------+--------------+ CFV      Full           Yes      Yes                                 +---------+---------------+---------+-----------+----------+--------------+ SFJ      Full                                                        +---------+---------------+---------+-----------+----------+--------------+ FV Prox  Full                                                         +---------+---------------+---------+-----------+----------+--------------+ FV Mid                  Yes      Yes                                 +---------+---------------+---------+-----------+----------+--------------+ FV Distal               Yes      Yes                                 +---------+---------------+---------+-----------+----------+--------------+  PFV      Full                                                        +---------+---------------+---------+-----------+----------+--------------+ POP      Full           Yes      Yes                                 +---------+---------------+---------+-----------+----------+--------------+ PTV                                                   Not visualized +---------+---------------+---------+-----------+----------+--------------+ PERO                                                  Not visualized +---------+---------------+---------+-----------+----------+--------------+   +----+---------------+---------+-----------+----------+--------------+ LEFTCompressibilityPhasicitySpontaneityPropertiesThrombus Aging +----+---------------+---------+-----------+----------+--------------+ CFV                Yes      Yes                                 +----+---------------+---------+-----------+----------+--------------+     Summary: Right: There is no evidence of deep vein thrombosis in the lower extremity. However, portions of this examination were limited- see technologist comments above. No cystic structure found in the popliteal fossa. Left: No evidence of common femoral vein obstruction.  *See table(s) above for measurements and observations. Electronically signed by Servando Snare MD on 03/17/2019 at 5:03:22 PM.    Final    [name] EKG SITE RITE  Result Date: 03/22/2019 If Site Rite image not attached, placement could not be confirmed due to current cardiac rhythm.    Time Spent in  minutes  30     Desiree Hane M.D on 03/22/2019 at 10:51 AM  To page go to [email or web address] - password [email or web address]

## 2019-03-22 NOTE — Progress Notes (Signed)
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 Found with sepsis from UTI and possible cellulitis of R LE, CHF.   OT comments  Pt making steady progress towards OT goals this session. Pt reports increased pain this session, but agreeable to limited session. Overall, pt required min guard for functional mobility with RW, min guard for simulated toilet transfer via functional mobility with RW. Currently MIN A for UB ADL, MAX A for LB ADLs.  DC plan currently remains appropriate, will continue to follow acutely per POC.   Follow Up Recommendations  Home health OT;Supervision/Assistance - 24 hour(inital 24/7 support)    Equipment Recommendations  3 in 1 bedside commode    Recommendations for Other Services      Precautions / Restrictions Precautions Precautions: Fall Restrictions Weight Bearing Restrictions: No       Mobility Bed Mobility               General bed mobility comments: Sitting EOB upon OT arrival.  Transfers Overall transfer level: Needs assistance Equipment used: Rolling walker (2 wheeled) Transfers: Sit to/from Stand Sit to Stand: Min guard;From elevated surface         General transfer comment: MIN guard for safety from heavily elevated surface    Balance Overall balance assessment: Needs assistance Sitting-balance support: No upper extremity supported;Feet supported Sitting balance-Leahy Scale: Good Sitting balance - Comments: supervision   Standing balance support: Bilateral upper extremity supported Standing balance-Leahy Scale: Poor Standing  balance comment: reliant on BUE support                           ADL either performed or assessed with clinical judgement   ADL Overall ADL's : Needs assistance/impaired                 Upper Body Dressing : Minimal assistance;Sitting Upper Body Dressing Details (indicate cue type and reason): hospital gown     Toilet Transfer: Min guard;Ambulation;RW;Cueing for safety Toilet Transfer Details (indicate cue type and reason): cues to keep RW close during simulated toilet transfer via functional mobility, min guard for safety         Functional mobility during ADLs: Min guard;Rolling walker General ADL Comments: pt reports increased pain this session only agreeable to walk household distance from EOB>door with RW with min guard assist     Vision   Vision Assessment?: No apparent visual deficits   Perception     Praxis      Cognition Arousal/Alertness: Awake/alert Behavior During Therapy: WFL for tasks assessed/performed Overall Cognitive Status: Within Functional Limits for tasks assessed                                 General Comments: very talkative Met wife in previous session who is an SLP who is very familiar with rehab and DME.         Exercises     Shoulder Instructions       General Comments      Pertinent Vitals/ Pain  Pain Assessment: Faces Faces Pain Scale: Hurts little more Pain Location: RLE Pain Descriptors / Indicators: Sore;Tender;Discomfort Pain Intervention(s): Limited activity within patient's tolerance;Monitored during session;Repositioned  Home Living                                          Prior Functioning/Environment              Frequency  Min 2X/week        Progress Toward Goals  OT Goals(current goals can now be found in the care plan section)  Progress towards OT goals: Progressing toward goals  Acute Rehab OT Goals Patient Stated Goal: to get back home  OT  Goal Formulation: With patient Time For Goal Achievement: 04/01/19 Potential to Achieve Goals: Good  Plan Discharge plan remains appropriate    Co-evaluation                 AM-PAC OT "6 Clicks" Daily Activity     Outcome Measure   Help from another person eating meals?: None Help from another person taking care of personal grooming?: A Little Help from another person toileting, which includes using toliet, bedpan, or urinal?: A Lot Help from another person bathing (including washing, rinsing, drying)?: A Little Help from another person to put on and taking off regular upper body clothing?: A Little Help from another person to put on and taking off regular lower body clothing?: A Lot 6 Click Score: 17    End of Session Equipment Utilized During Treatment: Rolling walker;Gait belt  OT Visit Diagnosis: Other abnormalities of gait and mobility (R26.89);Muscle weakness (generalized) (M62.81);Pain Pain - Right/Left: Right Pain - part of body: Leg   Activity Tolerance Patient tolerated treatment well   Patient Left in bed;with call bell/phone within reach   Nurse Communication          Time: 3151-7616 OT Time Calculation (min): 21 min  Charges: OT General Charges $OT Visit: 1 Visit OT Treatments $Therapeutic Activity: 8-22 mins  Lanier Clam., COTA/L Acute Rehabilitation Services [phone] [place] 03/22/2019, 1:01 PM

## 2019-03-22 NOTE — Progress Notes (Signed)
ANTICOAGULATION CONSULT NOTE - Follow Up Consult  Pharmacy Consult for Heparin; Coumadin  Indication: mechanical MVR/AVR and atrial fibrillation  Allergies  Allergen Reactions  . Warfarin And Related Other (See Comments)    COUMADIN or JANTOVEN BRAND ONLY** Per pt, was switched to generic in 2001 and had significantly decreased absorption > INR subtherapeutic> pt suffered a TIA and has been on brand name only since     Patient Measurements: Height: 5\' 11"  (180.3 cm) Weight: (!) 340 lb 13.3 oz (154.6 kg) IBW/kg (Calculated) : 75.3 Heparin Dosing Weight: 111 kg  Vital Signs: Temp: 97.6 F (36.4 C) (12/12 0433) Temp Source: Oral (12/12 0433) BP: 114/56 (12/12 0433) Pulse Rate: 71 (12/12 0433)  Labs: Recent Labs    03/20/19 0430 03/21/19 0250 03/21/19 1531 03/22/19 0236  HGB 8.5* 8.3*  --  9.0*  HCT 26.0* 25.2*  --  27.5*  PLT 216 207  --  230  LABPROT 24.5* 24.8*  --  27.5*  INR 2.2* 2.3*  --  2.6*  HEPARINUNFRC  --  0.22* 0.44 0.49  CREATININE 2.83* 2.33*  --  2.00*    Estimated Creatinine Clearance: 52 mL/min (A) (by C-G formula based on SCr of 2 mg/dL (H)).  Assessment:  73 yo M on Coumadin for mechanical MVR and AVR with Afib admitted with melena and INR of 5.2> 6.3 despite holding warfarin doses. Vitamin K 2 mg IV given on 12/8. Pharmacy was consulted to dose warfarin, with plans to continue PTA branded Coumadin stored in pharmacy.   Of note, patient takes COUMADIN or JANTOVEN BRAND ONLY. Per pt, was switched to generic in 2001 and had significantly decreased absorption > INR subtherapeutic> pt suffered a TIA and has been on brand name only since then.       Home Coumadin: 15 mg Sun/Wed, 10 all other days (stable dose since 2019).    Intermittent rectal bleeding as PTA. Patient reports thought due to internal and external hemorrhoids, per colonoscopy about 3 weeks ago.   Heparin level this am therapeutic at 0.49 on 1900 units/hour. INR therapeutic today at 2.6 s/p  10 mg on 12/11 (increased from 2.3 12/11). H&H is stable, today at 9/8.3. Of note, penicillin may increase the therapeutic effects of warfarin.   Goal of Therapy:  Heparin level 0.3-0.7 units/ml INR 2.5 - 3.5 Monitor platelets by anticoagulation protocol: Yes   Plan:  Continue heparin drip at 1900 units/hr Coumadin 10 mg x 1 tonight. Daily heparin level, INR and CBC. Follow up to discontinue IV heparin once INR therapeutic x 2   Thank you,   Eddie Candle, PharmD PGY-1 Pharmacy Resident   Please check amion for clinical pharmacist contact number

## 2019-03-22 NOTE — Progress Notes (Signed)
Spent over an hour with pt and wife obtaining consent.  Pt and wife with many questions and stories of medical events in the past.  Did give written consent this pm, but refuses for PICC to be placed today.  States he will talk to the doctor tomorrow and may possibly wait until he can speak with ID MD.  Pt has PIV working well at this time.  No discharge date yet.  Aware PICC is needed once d/c home for Long term antibiotics.  Will  Attempt to check back daily until agreeable for PICC placement.

## 2019-03-22 NOTE — Progress Notes (Signed)
PHARMACY CONSULT NOTE FOR:  OUTPATIENT  PARENTERAL ANTIBIOTIC THERAPY (OPAT)   Indication: strep G bacteremia with ICD in place (no vegetations noted) Regimen: 16 million units once daily as a continuous infusion  End date: 04/01/2019  IV antibiotic discharge orders are pended. To discharging provider:  please sign these orders via discharge navigator,  Select New Orders & click on the button choice - Manage This Unsigned Work.     Thank you for allowing pharmacy to be a part of this patient's care.    Eddie Candle, PharmD PGY-1 Pharmacy Resident   Please check amion for clinical pharmacist contact number   03/22/2019, 9:42 AM

## 2019-03-22 NOTE — Progress Notes (Addendum)
Subjective: No new complaints   Antibiotics:  Anti-infectives (From admission, onward)   Start     Dose/Rate Route Frequency Ordered Stop   03/20/19 1200  penicillin G potassium 8 Million Units in dextrose 5 % 500 mL continuous infusion     8 Million Units 41.7 mL/hr over 12 Hours Intravenous Every 12 hours 03/20/19 0945     03/17/19 2200  cefTRIAXone (ROCEPHIN) 1 g in sodium chloride 0.9 % 100 mL IVPB  Status:  Discontinued     1 g 200 mL/hr over 30 Minutes Intravenous Every 24 hours 03/17/19 0240 03/17/19 1536   03/17/19 2200  cefTRIAXone (ROCEPHIN) 2 g in sodium chloride 0.9 % 100 mL IVPB  Status:  Discontinued     2 g 200 mL/hr over 30 Minutes Intravenous Every 24 hours 03/17/19 1536 03/20/19 0945   03/17/19 0130  cefTRIAXone (ROCEPHIN) 1 g in sodium chloride 0.9 % 100 mL IVPB     1 g 200 mL/hr over 30 Minutes Intravenous  Once 03/17/19 0123 03/17/19 0214      Medications: Scheduled Meds:  furosemide  40 mg Intravenous BID   psyllium  1 packet Oral Daily   Warfarin - Pharmacist Dosing Inpatient   Does not apply q1800   Continuous Infusions:  heparin 1,900 Units/hr (03/21/19 2315)   penicillin g continuous IV infusion 8 Million Units (03/21/19 2313)   PRN Meds:.acetaminophen, HYDROcodone-acetaminophen, traMADol, witch hazel-glycerin    Objective: Weight change: -0.303 kg  Intake/Output Summary (Last 24 hours) at 03/22/2019 0920 Last data filed at 03/22/2019 0455 Gross per 24 hour  Intake 2105.63 ml  Output 2730 ml  Net -624.37 ml   Blood pressure (!) 114/56, pulse 71, temperature 97.6 F (36.4 C), temperature source Oral, resp. rate 18, height 5\' 11"  (1.803 m), weight (!) 154.6 kg, SpO2 97 %. Temp:  [97.6 F (36.4 C)-98 F (36.7 C)] 97.6 F (36.4 C) (12/12 0433) Pulse Rate:  [62-71] 71 (12/12 0433) Resp:  [12-20] 18 (12/12 0433) BP: (98-131)/(34-56) 114/56 (12/12 0433) SpO2:  [93 %-100 %] 97 % (12/12 0433) Weight:  [154.6 kg] 154.6 kg  (12/12 0433)  Physical Exam: General: Alert and awake, oriented x3, not in any acute distress. HEENT: anicteric sclera, EOMI CVS regular rate, normal  Chest: , no wheezing, no respiratory distress Abdomen: soft non-distended,  Neuro: nonfocal  CBC:    BMET Recent Labs    03/21/19 0250 03/22/19 0236  NA 132* 134*  K 3.8 3.6  CL 95* 94*  CO2 23 25  GLUCOSE 91 111*  BUN 87* 81*  CREATININE 2.33* 2.00*  CALCIUM 8.8* 8.8*     Liver Panel  No results for input(s): PROT, ALBUMIN, AST, ALT, ALKPHOS, BILITOT, BILIDIR, IBILI in the last 72 hours.     Sedimentation Rate No results for input(s): ESRSEDRATE in the last 72 hours. C-Reactive Protein No results for input(s): CRP in the last 72 hours.  Micro Results: Recent Results (from the past 720 hour(s))  Novel Coronavirus, NAA (Hosp order, Send-out to Ref Lab; TAT 18-24 hrs     Status: None   Collection Time: 02/25/19 12:10 PM   Specimen: Nasopharyngeal Swab; Respiratory  Result Value Ref Range Status   SARS-CoV-2, NAA NOT DETECTED NOT DETECTED Final    Comment: (NOTE) This nucleic acid amplification test was developed and its performance characteristics determined by [employer]. Nucleic acid amplification tests include PCR and TMA. This test has not been FDA cleared or approved.  This test has been authorized by FDA under an Emergency Use Authorization (EUA). This test is only authorized for the duration of time the declaration that circumstances exist justifying the authorization of the emergency use of in vitro diagnostic tests for detection of SARS-CoV-2 virus and/or diagnosis of COVID-19 infection under section 564(b)(1) of the Act, 21 U.S.C. 878MVE-7(M) (1), unless the authorization is terminated or revoked sooner. When diagnostic testing is negative, the possibility of a false negative result should be considered in the context of a patient's recent exposures and the presence of clinical signs and  symptoms consistent with COVID-19. An individual without symptoms of COVID- 19 and who is not shedding SARS-CoV-2 vi rus would expect to have a negative (not detected) result in this assay. Performed At: [facility] [address] [place], [place] [id number] Rush Farmer MD ZM:[id number]    [place]  Final    Comment: Performed at [facility], [place] [address] [place], [place] [address]  Blood culture (routine x 2)     Status: Abnormal   Collection Time: 03/16/19 11:59 PM   Specimen: BLOOD  Result Value Ref Range Status   Specimen Description BLOOD RIGHT HAND  Final   Special Requests   Final    BOTTLES DRAWN AEROBIC AND ANAEROBIC Blood Culture results may not be optimal due to an inadequate volume of blood received in culture bottles   Culture  Setup Time   Final    GRAM POSITIVE COCCI IN CHAINS AEROBIC BOTTLE ONLY CRITICAL RESULT CALLED TO, READ BACK BY AND VERIFIED WITH: E. SINCLAIR, PHARMD AT 1530 ON 03/17/19 BY C. JESSUP, MT.    Culture (A)  Final    STREPTOCOCCUS GROUP G SUSCEPTIBILITIES PERFORMED ON PREVIOUS CULTURE WITHIN THE LAST 5 DAYS. Performed at [facility], [place] [address] [place], [place] [address]    Report Status 03/19/2019 FINAL  Final  Blood culture (routine x 2)     Status: Abnormal   Collection Time: 03/16/19 11:59 PM   Specimen: BLOOD  Result Value Ref Range Status   Specimen Description BLOOD LEFT HAND  Final   Special Requests   Final    BOTTLES DRAWN AEROBIC AND ANAEROBIC Blood Culture adequate volume   Culture  Setup Time   Final    GRAM POSITIVE COCCI IN CHAINS IN BOTH AEROBIC AND ANAEROBIC BOTTLES CRITICAL RESULT CALLED TO, READ BACK BY AND VERIFIED WITHBronwen Betters [facility] 7517 03/17/19 A BROWNING Performed at [facility], [place] [address] [place], [place] [address]    Culture STREPTOCOCCUS GROUP G (A)  Final   Report Status 03/19/2019 FINAL  Final   Organism ID, Bacteria STREPTOCOCCUS GROUP G   Final      Susceptibility   Streptococcus group g - MIC*    CLINDAMYCIN <=0.25 SENSITIVE Sensitive     AMPICILLIN <=0.25 SENSITIVE Sensitive     ERYTHROMYCIN <=0.12 SENSITIVE Sensitive     VANCOMYCIN 0.5 SENSITIVE Sensitive     CEFTRIAXONE <=0.12 SENSITIVE Sensitive     LEVOFLOXACIN 0.5 SENSITIVE Sensitive     PENICILLIN Value in next row Sensitive      SENSITIVE<=0.06    * STREPTOCOCCUS GROUP G  Blood Culture ID Panel (Reflexed)     Status: Abnormal   Collection Time: 03/16/19 11:59 PM  Result Value Ref Range Status   Enterococcus species NOT DETECTED NOT DETECTED Final   Listeria monocytogenes NOT DETECTED NOT DETECTED Final   Staphylococcus species NOT DETECTED NOT DETECTED  Final   Staphylococcus aureus (BCID) NOT DETECTED NOT DETECTED Final   Streptococcus species DETECTED (A) NOT DETECTED Final    Comment: Not Enterococcus species, Streptococcus agalactiae, Streptococcus pyogenes, or Streptococcus pneumoniae. CRITICAL RESULT CALLED TO, READ BACK BY AND VERIFIED WITH: Bronwen Betters PHARMD [id number] 03/17/19 A BROWNING    Streptococcus agalactiae NOT DETECTED NOT DETECTED Final   Streptococcus pneumoniae NOT DETECTED NOT DETECTED Final   Streptococcus pyogenes NOT DETECTED NOT DETECTED Final   Acinetobacter baumannii NOT DETECTED NOT DETECTED Final   Enterobacteriaceae species NOT DETECTED NOT DETECTED Final   Enterobacter cloacae complex NOT DETECTED NOT DETECTED Final   Escherichia coli NOT DETECTED NOT DETECTED Final   Klebsiella oxytoca NOT DETECTED NOT DETECTED Final   Klebsiella pneumoniae NOT DETECTED NOT DETECTED Final   Proteus species NOT DETECTED NOT DETECTED Final   Serratia marcescens NOT DETECTED NOT DETECTED Final   Haemophilus influenzae NOT DETECTED NOT DETECTED Final   Neisseria meningitidis NOT DETECTED NOT DETECTED Final   Pseudomonas aeruginosa NOT DETECTED NOT DETECTED Final   Candida albicans NOT DETECTED NOT DETECTED Final   Candida glabrata NOT DETECTED NOT  DETECTED Final   Candida krusei NOT DETECTED NOT DETECTED Final   Candida parapsilosis NOT DETECTED NOT DETECTED Final   Candida tropicalis NOT DETECTED NOT DETECTED Final    Comment: Performed at [facility], [place]. [address] [place], [place] [address]  SARS CORONAVIRUS 2 (TAT 6-24 HRS) Nasopharyngeal Nasopharyngeal Swab     Status: None   Collection Time: 03/17/19 12:32 AM   Specimen: Nasopharyngeal Swab  Result Value Ref Range Status   SARS Coronavirus 2 NEGATIVE NEGATIVE Final    Comment: (NOTE) SARS-CoV-2 target nucleic acids are NOT DETECTED. The SARS-CoV-2 RNA is generally detectable in upper and lower respiratory specimens during the acute phase of infection. Negative results do not preclude SARS-CoV-2 infection, do not rule out co-infections with other pathogens, and should not be used as the sole basis for treatment or other patient management decisions. Negative results must be combined with clinical observations, patient history, and epidemiological information. The expected result is Negative. Fact Sheet for Patients: SugarRoll.be Fact Sheet for Healthcare Providers: https://www.woods-mathews.com/ This test is not yet approved or cleared by the [place] FDA and  has been authorized for detection and/or diagnosis of SARS-CoV-2 by FDA under an Emergency Use Authorization (EUA). This EUA will remain  in effect (meaning this test can be used) for the duration of the COVID-19 declaration under Section 56 4(b)(1) of the Act, 21 U.S.C. section 360bbb-3(b)(1), unless the authorization is terminated or revoked sooner. Performed at [facility], [place] [address] [place], [place] [address]   Urine culture     Status: Abnormal   Collection Time: 03/17/19 12:54 AM   Specimen: Urine, Random  Result Value Ref Range Status   Specimen Description URINE, RANDOM  Final   Special Requests   Final    NONE Performed at [facility], [place] [address], [place], [place] [address]    Culture MULTIPLE SPECIES PRESENT, SUGGEST RECOLLECTION (A)  Final   Report Status 03/18/2019 FINAL  Final  Culture, blood (Routine X 2) w Reflex to ID Panel     Status: None (Preliminary result)   Collection Time: 03/19/19  4:02 PM   Specimen: BLOOD RIGHT HAND  Result Value Ref Range Status   Specimen Description BLOOD RIGHT HAND  Final   Special Requests   Final    BOTTLES DRAWN AEROBIC  AND ANAEROBIC Blood Culture results may not be optimal due to an inadequate volume of blood received in culture bottles   Culture   Final    NO GROWTH 3 DAYS Performed at [facility], [place] [address] [place] [address] [place], [place] [address]    Report Status PENDING  Incomplete  Culture, blood (Routine X 2) w Reflex to ID Panel     Status: None (Preliminary result)   Collection Time: 03/19/19  4:10 PM   Specimen: BLOOD RIGHT HAND  Result Value Ref Range Status   Specimen Description BLOOD RIGHT HAND  Final   Special Requests   Final    BOTTLES DRAWN AEROBIC AND ANAEROBIC Blood Culture results may not be optimal due to an inadequate volume of blood received in culture bottles   Culture   Final    NO GROWTH 3 DAYS Performed at [facility], [place] [address] [place], [place] [address]    Report Status PENDING  Incomplete    Studies/Results: DG Ankle Right Port  Result Date: 03/20/2019 CLINICAL DATA:  Right ankle pain., Redness and swelling. EXAM: PORTABLE RIGHT ANKLE - 2 VIEW COMPARISON:  None. FINDINGS: There is no acute bony or joint abnormality. No soft tissue gas or radiopaque foreign body. Soft tissues are diffusely swollen. Atherosclerosis noted. No radiopaque foreign body. IMPRESSION: Diffuse soft tissue swelling about the ankle could be due to dependent change or cellulitis. The exam is otherwise negative. Electronically Signed   By: Inge Rise M.D.   On: 03/20/2019 14:51   ECHO TEE  Result Date: 03/21/2019   TRANSESOPHOGEAL ECHO  REPORT   Patient Name:   NEDIM OKI Comella Date of Exam: 03/21/2019 Medical Rec #:  [id number]       Height:       71.0 in Accession #:    [id number]      Weight:       341.5 lb Date of Birth:  1945/09/18      BSA:          2.65 m Patient Age:    83 years        BP:           111/45 mmHg Patient Gender: M               HR:           62 bpm. Exam Location:  Inpatient  Procedure: Transesophageal Echo Indications:    bacteremia  History:        Patient has prior history of Echocardiogram examinations, most                 recent 03/19/2019. 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  Sonographer:    Jannett Celestine RDCS (AE) Referring Phys: [place]: Consent was requested emergently by emergency room physicain. The transesophogeal probe was passed through the esophogus of the patient. The patient's vital signs; including heart rate, blood pressure, and oxygen saturation; remained stable throughout the procedure. The patient developed no complications during the procedure. IMPRESSIONS  1. Left ventricular ejection fraction, by visual estimation, is 55 to 60%. The left ventricle has normal function. Normal left ventricular size. There is no left ventricular  hypertrophy.  2. The left ventricle has no regional wall motion abnormalities.  3. Global right ventricle has normal systolic function.The right ventricular size is normal. No increase in right ventricular wall thickness.  4. Both RA and RV pacer wires are normal. No vegetations.  5. Left atrial size was moderately dilated.  6. Right atrial size was normal.  7. The mitral valve has been repaired/replaced. No evidence of mitral valve regurgitation. No evidence of mitral stenosis.  8. Mechanical mitral valve is functioning normally. No abscess. No rocking.  9. The tricuspid valve is normal in structure. Tricuspid valve  regurgitation is severe. 10. The aortic valve is tricuspid. Aortic valve regurgitation is mild. Mild to moderate aortic valve sclerosis/calcification without any evidence of aortic stenosis. 11. The pulmonic valve was normal in structure. Pulmonic valve regurgitation is mild. 12. A pacer wire is visualized. 13. The inferior vena cava is normal in size with greater than 50% respiratory variability, suggesting right atrial pressure of 3 mmHg. 14. No vegetations. No endocarditis. FINDINGS  Left Ventricle: Left ventricular ejection fraction, by visual estimation, is 55 to 60%. The left ventricle has normal function. The left ventricle has no regional wall motion abnormalities. There is no left ventricular hypertrophy. Normal left ventricular size. Normal left atrial pressure. Right Ventricle: The right ventricular size is normal. No increase in right ventricular wall thickness. Global RV systolic function is has normal systolic function. Both RA and RV pacer wires are normal. No vegetations. Left Atrium: Left atrial size was moderately dilated. Smoke noted in LA. Right Atrium: Right atrial size was normal in size Pericardium: There is no evidence of pericardial effusion. Mitral Valve: The mitral valve has been repaired/replaced. No evidence of mitral valve stenosis by observation. MV peak gradient, 18.5 mmHg. No evidence of mitral valve regurgitation. Mechanical mitral valve is functioning normally. No abscess. No rocking. Tricuspid Valve: The tricuspid valve is normal in structure. Tricuspid valve regurgitation is severe. Aortic Valve: The aortic valve is tricuspid. Aortic valve regurgitation is mild. Mild to moderate aortic valve sclerosis/calcification is present, without any evidence of aortic stenosis. Pulmonic Valve: The pulmonic valve was normal in structure. Pulmonic valve regurgitation is mild. Aorta: The aortic root, ascending aorta and aortic arch are all structurally normal, with no evidence of dilitation or  obstruction. Venous: The inferior vena cava is normal in size with greater than 50% respiratory variability, suggesting right atrial pressure of 3 mmHg. Shunts: There is no evidence of a patent foramen ovale. No ventricular septal defect is seen or detected. There is no evidence of an atrial septal defect. No atrial level shunt detected by color flow Doppler. Additional Comments: No vegetations. No endocarditis. A pacer wire is visualized.  MITRAL VALVE             Normals MV Peak grad: 18.5 mmHg  4 mmHg MV Mean grad: 7.0 mmHg MV Vmax:      2.15 m/s MV Vmean:     125.0 cm/s MV VTI:       0.43 m  Candee Furbish MD Electronically signed by Candee Furbish MD Signature Date/Time: 03/21/2019/2:26:09 PM    Final    [name] EKG SITE RITE  Result Date: 03/22/2019 If Site Rite image not attached, placement could not be confirmed due to current cardiac rhythm.     Assessment/Plan:  INTERVAL HISTORY: TEE without vegetations   Principal Problem:   UTI (urinary tract infection) Active Problems:   MORBID OBESITY   S/P aortic valve repair   S/P  mitral valve replacement-Mechanical   Chronic combined systolic and diastolic heart failure (HCC)   Complete heart block (HCC)   Rectal bleeding   Sepsis (HCC)   Acute respiratory failure with hypoxia (HCC)   Acute exacerbation of CHF (congestive heart failure) ([place])   AKI (acute kidney injury) ([place])   Group G streptococcal infection   Bacteremia   Hypotension    Frank [name] is a 73 y.o. male with  MVR/AVR Pacemaker, cellulitis and group G streptococcal bacteremia.  Fortunately he does not have any overt vegetations on his pacemaker wires or his mechanical or native valves.  #1 .  Bacteremia: Would continue penicillin and complete a 14-day course.  Would set up continuous infusion so the patient only needs to get connected to his antibiotics once a day.  If he dislikes the continuous infusion 2 g ceftriaxone daily is another option for him.  I would  recommend checking surveillance cultures 2 weeks after completing antibiotics  Diagnosis: Group G streptococcal bacteremia due to cellulitis  Culture Result: Group G Streptococcus  Allergies  Allergen Reactions   Warfarin And Related Other (See Comments)    COUMADIN or JANTOVEN BRAND ONLY** Per pt, was switched to generic in 2001 and had significantly decreased absorption > INR subtherapeutic> pt suffered a TIA and has been on brand name only since     OPAT Orders Discharge antibiotics:  Penicillin by continuous infusion)   Duration:  2 weeks End Date:  April 01, 2019:    [place] Per Protocol:  Labs BI- weekly while on IV antibiotics:  Labs  weekly while on IV antibiotics: x__ CBC with differential _x_ BMP w GFR/CMP  _x_ Please pull PIC at completion of IV antibiotics __ Please leave PIC in place until doctor has seen patient or been notified  Fax weekly labs to [phone]  Clinic Follow Up Appt:   Vania Rea has an appointment on 04/21/2019 with Dr. Tommy Medal  The [facility] is located in the [facility] at  [place] in [place].  [address], which is located to the left of the elevators.  Phone: [phone]  Fax: [phone]  [email or web address]     LOS: 5 days   Alcide Evener 03/22/2019, 9:20 AM

## 2019-03-23 DIAGNOSIS — L03115 Cellulitis of right lower limb: Secondary | ICD-10-CM

## 2019-03-23 DIAGNOSIS — L03116 Cellulitis of left lower limb: Secondary | ICD-10-CM

## 2019-03-23 DIAGNOSIS — R238 Other skin changes: Secondary | ICD-10-CM

## 2019-03-23 LAB — PROTIME-INR
INR: 3.4 — ABNORMAL HIGH (ref 0.8–1.2)
Prothrombin Time: 34.1 seconds — ABNORMAL HIGH (ref 11.4–15.2)

## 2019-03-23 LAB — CBC
HCT: 25.8 % — ABNORMAL LOW (ref 39.0–52.0)
Hemoglobin: 8.2 g/dL — ABNORMAL LOW (ref 13.0–17.0)
MCH: 30.8 pg (ref 26.0–34.0)
MCHC: 31.8 g/dL (ref 30.0–36.0)
MCV: 97 fL (ref 80.0–100.0)
Platelets: 245 10*3/uL (ref 150–400)
RBC: 2.66 MIL/uL — ABNORMAL LOW (ref 4.22–5.81)
RDW: 17.1 % — ABNORMAL HIGH (ref 11.5–15.5)
WBC: 9.4 10*3/uL (ref 4.0–10.5)
nRBC: 0.3 % — ABNORMAL HIGH (ref 0.0–0.2)

## 2019-03-23 LAB — BASIC METABOLIC PANEL
Anion gap: 13 (ref 5–15)
BUN: 77 mg/dL — ABNORMAL HIGH (ref 8–23)
CO2: 27 mmol/L (ref 22–32)
Calcium: 8.5 mg/dL — ABNORMAL LOW (ref 8.9–10.3)
Chloride: 90 mmol/L — ABNORMAL LOW (ref 98–111)
Creatinine, Ser: 1.88 mg/dL — ABNORMAL HIGH (ref 0.61–1.24)
GFR calc Af Amer: 41 mL/min — ABNORMAL LOW (ref >60)
GFR calc non Af Amer: 35 mL/min — ABNORMAL LOW (ref >60)
Glucose, Bld: 106 mg/dL — ABNORMAL HIGH (ref 70–99)
Potassium: 3.5 mmol/L (ref 3.5–5.1)
Sodium: 130 mmol/L — ABNORMAL LOW (ref 135–145)

## 2019-03-23 LAB — HEPARIN LEVEL (UNFRACTIONATED): Heparin Unfractionated: 0.63 IU/mL (ref 0.30–0.70)

## 2019-03-23 MED ORDER — HYDROCODONE-ACETAMINOPHEN 5-325 MG PO TABS
1.0000 | ORAL_TABLET | Freq: Four times a day (QID) | ORAL | Status: DC | PRN
  Administered 2019-03-25 – 2019-03-31 (×16): 2 via ORAL
  Filled 2019-03-23 (×18): qty 2

## 2019-03-23 MED ORDER — POTASSIUM CHLORIDE CRYS ER 20 MEQ PO TBCR
40.0000 meq | EXTENDED_RELEASE_TABLET | Freq: Once | ORAL | Status: AC
  Administered 2019-03-23: 40 meq via ORAL
  Filled 2019-03-23: qty 2

## 2019-03-23 MED ORDER — WARFARIN SODIUM 10 MG PO TABS
10.0000 mg | ORAL_TABLET | Freq: Once | ORAL | Status: AC
  Administered 2019-03-23: 10 mg via ORAL
  Filled 2019-03-23: qty 1

## 2019-03-23 MED ORDER — HYDROMORPHONE HCL 1 MG/ML IJ SOLN
1.0000 mg | INTRAMUSCULAR | Status: DC | PRN
  Administered 2019-03-23 – 2019-03-27 (×17): 1 mg via INTRAVENOUS
  Filled 2019-03-23 (×17): qty 1

## 2019-03-23 NOTE — Progress Notes (Addendum)
Subjective: Worsening bullae on his right lower leg where he has cellulitis with drainage of serous material  Also complaining of pain in his leg   Antibiotics:  Anti-infectives (From admission, onward)   Start     Dose/Rate Route Frequency Ordered Stop   03/20/19 1200  penicillin G potassium 8 Million Units in dextrose 5 % 500 mL continuous infusion     8 Million Units 41.7 mL/hr over 12 Hours Intravenous Every 12 hours 03/20/19 0945     03/17/19 2200  cefTRIAXone (ROCEPHIN) 1 g in sodium chloride 0.9 % 100 mL IVPB  Status:  Discontinued     1 g 200 mL/hr over 30 Minutes Intravenous Every 24 hours 03/17/19 0240 03/17/19 1536   03/17/19 2200  cefTRIAXone (ROCEPHIN) 2 g in sodium chloride 0.9 % 100 mL IVPB  Status:  Discontinued     2 g 200 mL/hr over 30 Minutes Intravenous Every 24 hours 03/17/19 1536 03/20/19 0945   03/17/19 0130  cefTRIAXone (ROCEPHIN) 1 g in sodium chloride 0.9 % 100 mL IVPB     1 g 200 mL/hr over 30 Minutes Intravenous  Once 03/17/19 0123 03/17/19 0214      Medications: Scheduled Meds:  furosemide  40 mg Intravenous BID   psyllium  1 packet Oral Daily   warfarin  10 mg Oral ONCE-1800   Warfarin - Pharmacist Dosing Inpatient   Does not apply q1800   Continuous Infusions:  penicillin g continuous IV infusion 8 Million Units (03/23/19 1222)   PRN Meds:.acetaminophen, HYDROcodone-acetaminophen, HYDROmorphone (DILAUDID) injection, traMADol, witch hazel-glycerin    Objective: Weight change: 1.437 kg  Intake/Output Summary (Last 24 hours) at 03/23/2019 1251 Last data filed at 03/23/2019 1100 Gross per 24 hour  Intake 1335 ml  Output 1800 ml  Net -465 ml   Blood pressure (!) 116/55, pulse 86, temperature 98 F (36.7 C), temperature source Oral, resp. rate 13, height 5\' 11"  (1.803 m), weight (!) 156 kg, SpO2 96 %. Temp:  [97.9 F (36.6 C)-98.7 F (37.1 C)] 98 F (36.7 C) (12/13 1237) Pulse Rate:  [60-86] 86 (12/13 1237) Resp:   [12-21] 13 (12/13 1237) BP: (110-132)/(51-60) 116/55 (12/13 1237) SpO2:  [90 %-96 %] 96 % (12/13 1237) Weight:  [156 kg] 156 kg (12/13 0500)  Physical Exam: General: Alert and awake, oriented x3, not in any acute distress. HEENT: anicteric sclera, EOMI CVS regular rate, normal  Chest: , no wheezing, no respiratory distress Abdomen: soft non-distended,   Right lower extremity with bullous cellulitis and also some petechial elements in his erythema see picture 03/23/2019:      Neuro: nonfocal  CBC:    BMET Recent Labs    03/22/19 0236 03/23/19 0228  NA 134* 130*  K 3.6 3.5  CL 94* 90*  CO2 25 27  GLUCOSE 111* 106*  BUN 81* 77*  CREATININE 2.00* 1.88*  CALCIUM 8.8* 8.5*     Liver Panel  No results for input(s): PROT, ALBUMIN, AST, ALT, ALKPHOS, BILITOT, BILIDIR, IBILI in the last 72 hours.     Sedimentation Rate No results for input(s): ESRSEDRATE in the last 72 hours. C-Reactive Protein No results for input(s): CRP in the last 72 hours.  Micro Results: Recent Results (from the past 720 hour(s))  Novel Coronavirus, NAA (Hosp order, Send-out to Ref Lab; TAT 18-24 hrs     Status: None   Collection Time: 02/25/19 12:10 PM   Specimen: Nasopharyngeal Swab; Respiratory  Result Value Ref  Range Status   SARS-CoV-2, NAA NOT DETECTED NOT DETECTED Final    Comment: (NOTE) This nucleic acid amplification test was developed and its performance characteristics determined by [employer]. Nucleic acid amplification tests include PCR and TMA. This test has not been FDA cleared or approved. This test has been authorized by FDA under an Emergency Use Authorization (EUA). This test is only authorized for the duration of time the declaration that circumstances exist justifying the authorization of the emergency use of in vitro diagnostic tests for detection of SARS-CoV-2 virus and/or diagnosis of COVID-19 infection under section 564(b)(1) of the Act, 21 U.S.C.  035KKX-3(G) (1), unless the authorization is terminated or revoked sooner. When diagnostic testing is negative, the possibility of a false negative result should be considered in the context of a patient's recent exposures and the presence of clinical signs and symptoms consistent with COVID-19. An individual without symptoms of COVID- 19 and who is not shedding SARS-CoV-2 vi rus would expect to have a negative (not detected) result in this assay. Performed At: [facility] [address] [place], [place] [id number] Rush Farmer MD RC:[id number]    [place]  Final    Comment: Performed at [facility], [place] [address] [place], [place] [address]  Blood culture (routine x 2)     Status: Abnormal   Collection Time: 03/16/19 11:59 PM   Specimen: BLOOD  Result Value Ref Range Status   Specimen Description BLOOD RIGHT HAND  Final   Special Requests   Final    BOTTLES DRAWN AEROBIC AND ANAEROBIC Blood Culture results may not be optimal due to an inadequate volume of blood received in culture bottles   Culture  Setup Time   Final    GRAM POSITIVE COCCI IN CHAINS AEROBIC BOTTLE ONLY CRITICAL RESULT CALLED TO, READ BACK BY AND VERIFIED WITH: E. SINCLAIR, PHARMD AT 1530 ON 03/17/19 BY C. JESSUP, MT.    Culture (A)  Final    STREPTOCOCCUS GROUP G SUSCEPTIBILITIES PERFORMED ON PREVIOUS CULTURE WITHIN THE LAST 5 DAYS. Performed at [facility], [place] [address] [place], [place] [address]    Report Status 03/19/2019 FINAL  Final  Blood culture (routine x 2)     Status: Abnormal   Collection Time: 03/16/19 11:59 PM   Specimen: BLOOD  Result Value Ref Range Status   Specimen Description BLOOD LEFT HAND  Final   Special Requests   Final    BOTTLES DRAWN AEROBIC AND ANAEROBIC Blood Culture adequate volume   Culture  Setup Time   Final    GRAM POSITIVE COCCI IN CHAINS IN BOTH AEROBIC AND ANAEROBIC BOTTLES CRITICAL RESULT CALLED TO, READ BACK BY AND  VERIFIED WITHBronwen Betters [facility] 2423 03/17/19 A BROWNING Performed at [facility], [place] [address], [place], [place] [address]    Culture STREPTOCOCCUS GROUP G (A)  Final   Report Status 03/19/2019 FINAL  Final   Organism ID, Bacteria STREPTOCOCCUS GROUP G  Final      Susceptibility   Streptococcus group g - MIC*    CLINDAMYCIN <=0.25 SENSITIVE Sensitive     AMPICILLIN <=0.25 SENSITIVE Sensitive     ERYTHROMYCIN <=0.12 SENSITIVE Sensitive     VANCOMYCIN 0.5 SENSITIVE Sensitive     CEFTRIAXONE <=0.12 SENSITIVE Sensitive     LEVOFLOXACIN 0.5 SENSITIVE Sensitive     PENICILLIN Value in next row Sensitive      SENSITIVE<=0.06    * STREPTOCOCCUS GROUP G  Blood Culture  ID Panel (Reflexed)     Status: Abnormal   Collection Time: 03/16/19 11:59 PM  Result Value Ref Range Status   Enterococcus species NOT DETECTED NOT DETECTED Final   Listeria monocytogenes NOT DETECTED NOT DETECTED Final   Staphylococcus species NOT DETECTED NOT DETECTED Final   Staphylococcus aureus (BCID) NOT DETECTED NOT DETECTED Final   Streptococcus species DETECTED (A) NOT DETECTED Final    Comment: Not Enterococcus species, Streptococcus agalactiae, Streptococcus pyogenes, or Streptococcus pneumoniae. CRITICAL RESULT CALLED TO, READ BACK BY AND VERIFIED WITH: Bronwen Betters PHARMD 1741 03/17/19 A BROWNING    Streptococcus agalactiae NOT DETECTED NOT DETECTED Final   Streptococcus pneumoniae NOT DETECTED NOT DETECTED Final   Streptococcus pyogenes NOT DETECTED NOT DETECTED Final   Acinetobacter baumannii NOT DETECTED NOT DETECTED Final   Enterobacteriaceae species NOT DETECTED NOT DETECTED Final   Enterobacter cloacae complex NOT DETECTED NOT DETECTED Final   Escherichia coli NOT DETECTED NOT DETECTED Final   Klebsiella oxytoca NOT DETECTED NOT DETECTED Final   Klebsiella pneumoniae NOT DETECTED NOT DETECTED Final   Proteus species NOT DETECTED NOT DETECTED Final   Serratia marcescens NOT DETECTED NOT DETECTED Final    Haemophilus influenzae NOT DETECTED NOT DETECTED Final   Neisseria meningitidis NOT DETECTED NOT DETECTED Final   Pseudomonas aeruginosa NOT DETECTED NOT DETECTED Final   Candida albicans NOT DETECTED NOT DETECTED Final   Candida glabrata NOT DETECTED NOT DETECTED Final   Candida krusei NOT DETECTED NOT DETECTED Final   Candida parapsilosis NOT DETECTED NOT DETECTED Final   Candida tropicalis NOT DETECTED NOT DETECTED Final    Comment: Performed at [facility], [place]. [address], [place], [place] [address]  SARS CORONAVIRUS 2 (TAT 6-24 HRS) Nasopharyngeal Nasopharyngeal Swab     Status: None   Collection Time: 03/17/19 12:32 AM   Specimen: Nasopharyngeal Swab  Result Value Ref Range Status   SARS Coronavirus 2 NEGATIVE NEGATIVE Final    Comment: (NOTE) SARS-CoV-2 target nucleic acids are NOT DETECTED. The SARS-CoV-2 RNA is generally detectable in upper and lower respiratory specimens during the acute phase of infection. Negative results do not preclude SARS-CoV-2 infection, do not rule out co-infections with other pathogens, and should not be used as the sole basis for treatment or other patient management decisions. Negative results must be combined with clinical observations, patient history, and epidemiological information. The expected result is Negative. Fact Sheet for Patients: Sugar[place].be Fact Sheet for Healthcare Providers: https://www.woods-mathews.com/ This test is not yet approved or cleared by the [place] FDA and  has been authorized for detection and/or diagnosis of SARS-CoV-2 by FDA under an Emergency Use Authorization (EUA). This EUA will remain  in effect (meaning this test can be used) for the duration of the COVID-19 declaration under Section 56 4(b)(1) of the Act, 21 U.S.C. section 360bbb-3(b)(1), unless the authorization is terminated or revoked sooner. Performed at [facility], [place] [address],  [place], [place] [address]   Urine culture     Status: Abnormal   Collection Time: 03/17/19 12:54 AM   Specimen: Urine, Random  Result Value Ref Range Status   Specimen Description URINE, RANDOM  Final   Special Requests   Final    NONE Performed at [facility], [place] [address] [place], [place] [address]    Culture MULTIPLE SPECIES PRESENT, SUGGEST RECOLLECTION (A)  Final   Report Status 03/18/2019 FINAL  Final  Culture, blood (Routine X 2) w Reflex to ID Panel  Status: None (Preliminary result)   Collection Time: 03/19/19  4:02 PM   Specimen: BLOOD RIGHT HAND  Result Value Ref Range Status   Specimen Description BLOOD RIGHT HAND  Final   Special Requests   Final    BOTTLES DRAWN AEROBIC AND ANAEROBIC Blood Culture results may not be optimal due to an inadequate volume of blood received in culture bottles   Culture   Final    NO GROWTH 4 DAYS Performed at [facility], [place] [address], [place], [place] [address]    Report Status PENDING  Incomplete  Culture, blood (Routine X 2) w Reflex to ID Panel     Status: None (Preliminary result)   Collection Time: 03/19/19  4:10 PM   Specimen: BLOOD RIGHT HAND  Result Value Ref Range Status   Specimen Description BLOOD RIGHT HAND  Final   Special Requests   Final    BOTTLES DRAWN AEROBIC AND ANAEROBIC Blood Culture results may not be optimal due to an inadequate volume of blood received in culture bottles   Culture   Final    NO GROWTH 4 DAYS Performed at [facility], [place] [address], [place], [place] [address]    Report Status PENDING  Incomplete    Studies/Results: ECHO TEE  Result Date: 03/21/2019   TRANSESOPHOGEAL ECHO REPORT   Patient Name:   Frank [name] Date of Exam: 03/21/2019 Medical Rec #:  [id number]       Height:       71.0 in Accession #:    [id number]      Weight:       341.5 lb Date of Birth:  [birth year]      BSA:          2.65 m Patient Age:    40 years        BP:           111/45 mmHg Patient  Gender: M               HR:           62 bpm. Exam Location:  Inpatient  Procedure: Transesophageal Echo Indications:    bacteremia  History:        Patient has prior history of Echocardiogram examinations, most                 recent 03/19/2019. 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  Sonographer:    Jannett Celestine RDCS (AE) Referring Phys: [place]: Consent was requested emergently by emergency room physicain. The transesophogeal probe was passed through the esophogus of the patient. The patient's vital signs; including heart rate, blood pressure, and oxygen saturation; remained stable throughout the procedure. The patient developed no complications during the procedure. IMPRESSIONS  1. Left ventricular ejection fraction, by visual estimation, is 55 to 60%. The left ventricle has normal function. Normal left ventricular size. There is no left ventricular hypertrophy.  2. The left ventricle has no regional wall motion abnormalities.  3. Global right ventricle has normal systolic function.The right ventricular size is normal. No increase in right ventricular wall thickness.  4. Both RA and RV pacer wires are normal. No vegetations.  5. Left atrial size was moderately dilated.  6. Right atrial size was normal.  7. The mitral valve has been repaired/replaced. No evidence of mitral valve regurgitation. No evidence of mitral stenosis.  8. Mechanical mitral valve is functioning normally. No abscess. No rocking.  9. The tricuspid valve is normal in structure. Tricuspid valve regurgitation is severe. 10. The aortic valve is tricuspid. Aortic valve regurgitation is mild. Mild to moderate aortic valve sclerosis/calcification without any evidence of aortic stenosis. 11. The pulmonic valve was normal in structure. Pulmonic valve regurgitation is mild. 12. A pacer wire is  visualized. 13. The inferior vena cava is normal in size with greater than 50% respiratory variability, suggesting right atrial pressure of 3 mmHg. 14. No vegetations. No endocarditis. FINDINGS  Left Ventricle: Left ventricular ejection fraction, by visual estimation, is 55 to 60%. The left ventricle has normal function. The left ventricle has no regional wall motion abnormalities. There is no left ventricular hypertrophy. Normal left ventricular size. Normal left atrial pressure. Right Ventricle: The right ventricular size is normal. No increase in right ventricular wall thickness. Global RV systolic function is has normal systolic function. Both RA and RV pacer wires are normal. No vegetations. Left Atrium: Left atrial size was moderately dilated. Smoke noted in LA. Right Atrium: Right atrial size was normal in size Pericardium: There is no evidence of pericardial effusion. Mitral Valve: The mitral valve has been repaired/replaced. No evidence of mitral valve stenosis by observation. MV peak gradient, 18.5 mmHg. No evidence of mitral valve regurgitation. Mechanical mitral valve is functioning normally. No abscess. No rocking. Tricuspid Valve: The tricuspid valve is normal in structure. Tricuspid valve regurgitation is severe. Aortic Valve: The aortic valve is tricuspid. Aortic valve regurgitation is mild. Mild to moderate aortic valve sclerosis/calcification is present, without any evidence of aortic stenosis. Pulmonic Valve: The pulmonic valve was normal in structure. Pulmonic valve regurgitation is mild. Aorta: The aortic root, ascending aorta and aortic arch are all structurally normal, with no evidence of dilitation or obstruction. Venous: The inferior vena cava is normal in size with greater than 50% respiratory variability, suggesting right atrial pressure of 3 mmHg. Shunts: There is no evidence of a patent foramen ovale. No ventricular septal defect is seen or detected. There is no evidence of an atrial  septal defect. No atrial level shunt detected by color flow Doppler. Additional Comments: No vegetations. No endocarditis. A pacer wire is visualized.  MITRAL VALVE             Normals MV Peak grad: 18.5 mmHg  4 mmHg MV Mean grad: 7.0 mmHg MV Vmax:      2.15 m/s MV Vmean:     125.0 cm/s MV VTI:       0.43 m  Candee Furbish MD Electronically signed by Candee Furbish MD Signature Date/Time: 03/21/2019/2:26:09 PM    Final    [name] EKG SITE RITE  Result Date: 03/22/2019 If Site Rite image not attached, placement could not be confirmed due to current cardiac rhythm.     Assessment/Plan:  INTERVAL HISTORY: Bullae have ruptured somewhat with some serous drainage  Principal Problem:   UTI (urinary tract infection) Active Problems:   MORBID OBESITY   S/P aortic valve repair   S/P mitral valve replacement-Mechanical   Chronic combined systolic and diastolic heart failure (HCC)   Complete heart block (HCC)   Rectal bleeding   Sepsis (HCC)   Acute respiratory failure with hypoxia (HCC)   Acute exacerbation of CHF (congestive  heart failure) ([place])   AKI (acute kidney injury) ([place])   Group G streptococcal infection   Bacteremia   Hypotension    Frank [name] is a 73 y.o. male with  MVR/AVR Pacemaker, cellulitis and group G streptococcal bacteremia.  Fortunately he does not have any overt vegetations on his pacemaker wires or his mechanical or native valves.  #1 .  Bacteremia secondary to cellulitis: Would continue penicillin and complete a 14-day course.  Would set up continuous infusion so the patient only needs to get connected to his antibiotics once a day.  He and his wife are in agreement with targeted therapy with continuous penicillin infusions  I would recommend checking surveillance cultures 2 weeks after completing antibiotics  #2 cellulitis: His leg is going through changes that we typically do see an severe cellulitis.  I certainly would not alter his antibiotics whatsoever.  It will  help if he raises the leg above the heart.  I suspect once he starts ambulating the erythema may worsen further.  Talk to the patient and his wife he has had chronic right-sided greater than left-sided edema.  He will be at risk for recurrent cellulitis in the future and I will be helpful for him to have a bottle of Keflex on hand should he have another episode of cellulitis in particular given the severity of his current illness and the fact that he has a device  Diagnosis: Group G streptococcal bacteremia due to cellulitis  Culture Result: Group G Streptococcus  Allergies  Allergen Reactions   Warfarin And Related Other (See Comments)    COUMADIN or JANTOVEN BRAND ONLY** Per pt, was switched to generic in 2001 and had significantly decreased absorption > INR subtherapeutic> pt suffered a TIA and has been on brand name only since     OPAT Orders Discharge antibiotics:  Penicillin by continuous infusion)   Duration:  2 weeks End Date:  April 01, 2019:    [place] Per Protocol:  Labs BI- weekly while on IV antibiotics:  Labs  weekly while on IV antibiotics: x__ CBC with differential _x_ BMP w GFR/CMP  _x_ Please pull PIC at completion of IV antibiotics __ Please leave PIC in place until doctor has seen patient or been notified  Fax weekly labs to [phone]  Clinic Follow Up Appt:   EPHREM CARRICK has an appointment on 04/21/2019 with Dr. Tommy Medal  The [facility] is located in the [facility] at  [address] in [place].  [address], which is located to the left of the elevators.  Phone: [phone]  Fax: [phone]  [email or web address]  I will sign off for now please call with further questions.   LOS: 6 days   Alcide Evener 03/23/2019, 12:51 PM

## 2019-03-23 NOTE — Progress Notes (Addendum)
ANTICOAGULATION CONSULT NOTE - Follow Up Consult  Pharmacy Consult for Heparin; Coumadin  Indication: mechanical MVR/AVR and atrial fibrillation  Allergies  Allergen Reactions  . Warfarin And Related Other (See Comments)    COUMADIN or JANTOVEN BRAND ONLY** Per pt, was switched to generic in 2001 and had significantly decreased absorption > INR subtherapeutic> pt suffered a TIA and has been on brand name only since     Patient Measurements: Height: 5\' 11"  (180.3 cm) Weight: (!) 344 lb (156 kg) IBW/kg (Calculated) : 75.3 Heparin Dosing Weight: 111 kg  Vital Signs: Temp: 97.9 F (36.6 C) (12/13 0358) Temp Source: Oral (12/13 0358) BP: 113/58 (12/13 0358) Pulse Rate: 60 (12/12 2357)  Labs: Recent Labs    03/21/19 0250 03/21/19 1531 03/22/19 0236 03/23/19 0228  HGB 8.3*  --  9.0* 8.2*  HCT 25.2*  --  27.5* 25.8*  PLT 207  --  230 245  LABPROT 24.8*  --  27.5* 34.1*  INR 2.3*  --  2.6* 3.4*  HEPARINUNFRC 0.22* 0.44 0.49 0.63  CREATININE 2.33*  --  2.00* 1.88*    Estimated Creatinine Clearance: 55.6 mL/min (A) (by C-G formula based on SCr of 1.88 mg/dL (H)).  Assessment:  73 yo M on Coumadin for mechanical MVR and AVR with Afib admitted with melena and INR of 5.2> 6.3 despite holding warfarin doses. Vitamin K 2 mg IV given on 12/8. Pharmacy was consulted to dose warfarin, with plans to continue PTA branded Coumadin stored in pharmacy. Transitioned to heparin for TEE completed 12/11, now transitioning back to home Coumadin.  Of note, patient takes COUMADIN or JANTOVEN BRAND ONLY. Per pt, was switched to generic in 2001 and had significantly decreased absorption > INR subtherapeutic> pt suffered a TIA and has been on brand name only since then.      Home Coumadin: 15 mg Sun/Wed, 10 all other days (stable dose since 2019).    Intermittent rectal bleeding as PTA. Patient reports thought due to internal and external hemorrhoids, per colonoscopy about 3 weeks ago.   Patient  received home Coumadin 10 mg last night per RN. INR this morning therapeutic at 3.6, will discontinue heparin drip. Patient usually takes Coumadin 15 mg on sundays, will give 10 mg with INR trending up and stopping heparin. No issues with bleeding reported, H&H low stable.  Will continue to monitor closely while receiving penicillin.   Goal of Therapy:  Heparin level 0.3-0.7 units/ml INR 2.5 - 3.5 Monitor platelets by anticoagulation protocol: Yes   Plan:  Stop heparin infusion Coumadin 10 mg x 1 tonight Daily INR and CBC    Thank you for allowing pharmacy to participate in this patient's care.  Briya Lookabaugh L. Devin Going, [facility] PGY1 Pharmacy Resident [phone] 03/23/19      7:51 AM  Please check AMION for all [place] phone numbers After 10:00 PM, call the [facility] [phone]

## 2019-03-23 NOTE — Progress Notes (Signed)
Spoke with Frank [name], states the pt spoke with the doctor this morning about the PICC line.  [name] RN asked the pt if he is willing for PICC to be placed today.  Pt states no, but will definitely allow it to be placed Monday. Pt still has PIV working well.  Pt verbalizes increased leg pain today, with draining blisters not present yesterday and wants to wait on PICC.

## 2019-03-23 NOTE — Progress Notes (Signed)
[facility]  PROGRESS NOTE  [name] XKG:[id number] DOB: December 08, 1945 DOA: 03/16/2019 PCP: [name], MD Admit date - 03/16/2019   Admitting Physician [name], MD  Outpatient Primary MD for the patient is [name], MD  LOS - 6 Brief Narrative   Frank [name] is a 73 y.o. 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  In the ED found to be febrile to 103, tachypneic, hypoxic to 86% on room air requiring 2 L, WBC 29, lactic acid 2.8, creatinine 2.5, fecal occult positive, Covid test negative.  UA remarkable for large leukocytes, greater than 50 WBCs, rare bacteria Chest x-ray showed cardiomegaly with mild volume overload. Patient was admitted with working diagnosis of sepsis presumed secondary to UTI  Hospital course complicated by group G strep bacteremia from severe RLE cellulitis with skin changes addressed below  Subjective  Mr. Leggitt today reports exquisite pain in right calf and ankle with development of new blisters overnight with some clear drainage  A & P     Group G strep bacteremia and RLE nonpurulent cellulitis, stable Has evolving bullae with serous drainage in setting of severe cellulitis and acute on chronic right leg edemaTTE/TEE are both negative for PV endocarditis, remains afebrile, leukocytosis improving, tenderness to right ankle improving, x-ray findings consistent with soft tissue infection.  -Repeat blood cultures are negative  --ID agrees continuing current abx as expect skin changes with the severity of his cellulitis  --elevated leg to  improve dependent edema, IV lasix for volume overload  --closely monitor erythema, skin changes with skin marker  -Continue IV  penicillin G continuous x 14 days ( end date 04/01/19) per ID recommendations (2 g IV ceftriaxone is an option if unable to tolerate)  --Plan for PICC   Sepsis, resolved. Likely related to group G strep bacteremia.  Initially admitted for possible UTI given large leuks but asymptomatic and no bacteria.  Currently afebrile,White count downtrending, BP better.   -Continue IV penicillin G x 14 days as mentioned above  Nonoliguric AKI on CKD stage III with hyperkalemia, improving. Likely related to hypotension related to sepsis/bacteremia.  Peak creatinine of 3.65, now 1.8, baseline 1.2-1.6.  UA unremarkable still making good urine, potassium wnl after lokelma  -Monitor urine output, follow BMP, avoid nephrotoxins  -If creatinine worsens or urine output decreases will obtain renal ultrasound formally consult  nephrology, anticipate improvement with IV fluids  -Hold home losartan          Acute hypoxic respiratory failure, resolved.  Likely resultant of acute exacerbation of CHF in  the setting of infection/bacteremia.  Improving as patient is currently room air down from 2 L  -Now tolerating IV Lasix and maintaining normal blood pressure  - closely monitor weights, output   Hypotension, improving.  Likely related to sepsis/bacteremia. Had worsening drop with IV diuretics earlier in hospitalization  -Closely monitor on IV diuretics  Acute on chronic Combined systolic/diastolic CHF.    CXR with mild volume overload, currently on room air,  has significant B/l edema, R>L slowly improving on IV Lasix diuresis. -3 L during hospital stay  -Continue IV Lasix 40 mg twice daily, monitor output and blood pressure, (home  regimen torsemide 60 mg twice daily)  -Monitor ins and outs, daily weights,   -Holding off on home metolazone/spironolactone, due to previous hypotension         Mechanical mitral valve/aortic valve.  Supratherapeutic on admission requiring vitamin K,             Currently therapeutic  -coumadin per pharmacy protocol, daily INR goal (2.5-3.5)         Anemia secondary to iron deficiency.  Goal hemoglobin greater than 8 given significant  cardiac history  -Hold off on further iron supplementation in setting of concurrent infection         Paroxysmal atrial fibrillation, complete heart block status post pacemaker.  Currently                rate controlled in NSR.  -Followed by Dr. [name] as outpatient continue to closely monitor  --bridging back to coumadin, on heparin gtt        Chronic right lower leg edema.  Venous duplex negative for DVT  -Continue to monitor        Rectal bleeding.  Likely due to known diagnosis of hemorrhoids in the setting of supratherapeutic INR.  Currently stable, hemoglobin has slowly down trended.  -Has not had any further episodes during hospital stay, continue to monitor        Hypervolemic hyponatremia, mild.  In setting of acute seizure exacerbation with IVs 5 overload on exam, expect improvement with IV Lasix  -Monitor BMP      Family Communication  : no family at bedside, will call wife to update Code Status : Full code  Disposition Plan  : Continue IV penicillin G, needs PICC Consults  : Cardiology, ID  Procedures  : TTE (12/9), TEE 12/11  DVT Prophylaxis  : Heparin, coumadin Lab Results  Component Value Date   PLT 245 03/23/2019    Diet :  Diet Order            Diet regular Room service appropriate? Yes; Fluid consistency: Thin  Diet effective now               Inpatient Medications Scheduled Meds: . furosemide  40 mg Intravenous BID  . psyllium  1 packet Oral Daily  . warfarin  10 mg Oral ONCE-1800  . Warfarin - Pharmacist Dosing Inpatient   Does not apply q1800   Continuous Infusions: . penicillin g continuous IV infusion 8 Million Units (03/22/19 2359)   PRN Meds:.acetaminophen,  HYDROcodone-acetaminophen, traMADol, witch hazel-glycerin  Antibiotics  :   Anti-infectives (From admission, onward)   Start     Dose/Rate Route Frequency Ordered Stop   03/20/19 1200  penicillin G potassium 8 Million Units in dextrose 5 % 500 mL continuous infusion     8 Million Units 41.7 mL/hr over 12 Hours Intravenous Every 12 hours 03/20/19 0945     03/17/19 2200  cefTRIAXone (ROCEPHIN) 1 g in sodium chloride 0.9 % 100 mL IVPB  Status:  Discontinued     1 g 200 mL/hr over 30 Minutes Intravenous Every 24 hours 03/17/19 0240 03/17/19 1536   03/17/19 2200  cefTRIAXone (ROCEPHIN) 2 g in sodium chloride 0.9 % 100 mL IVPB  Status:  Discontinued     2 g 200 mL/hr over 30 Minutes Intravenous Every 24 hours 03/17/19 1536 03/20/19 0945   03/17/19 0130  cefTRIAXone (ROCEPHIN) 1 g in sodium chloride 0.9 % 100 mL IVPB     1 g 200 mL/hr over 30 Minutes  Intravenous  Once 03/17/19 0123 03/17/19 0214       Objective   Vitals:   03/22/19 2027 03/22/19 2357 03/23/19 0358 03/23/19 0500  BP: (!) 119/51 (!) 110/52 (!) 113/58   Pulse: 76 60    Resp: 15 20 12    Temp: 98.7 F (37.1 C) 98.2 F (36.8 C) 97.9 F (36.6 C)   TempSrc: Oral Oral Oral   SpO2: 94% 96% 96%   Weight:    (!) 156 kg  Height:        SpO2: 96 % O2 Flow Rate (L/min): 2 L/min  Wt Readings from Last 3 Encounters:  03/23/19 (!) 156 kg  02/28/19 (!) 150.1 kg  02/14/19 (!) 156 kg     Intake/Output Summary (Last 24 hours) at 03/23/2019 0921 Last data filed at 03/23/2019 0700 Gross per 24 hour  Intake 935 ml  Output 2150 ml  Net -1215 ml    Physical Exam:  Awake Alert, Oriented X 3, Normal affect No new F.N deficits,  [place].AT, No JVD appreciated Symmetrical Chest wall movement, Good air movement bilaterally, CTAB on room air RRR,systolic click and murmur heard +ve B.Sounds, Abd Soft, No tenderness, No organomegaly appreciated, No rebound, guarding or rigidity. Right leg 2+ edema to above thigh, circumferential  redness and warmth with exquisite tenderness of calf and ankle, multiple bullae with serosanguinous drainage,  left leg 1 + edema to above thigh.   Right posterior calf 03/23/19               I have personally reviewed the following:   Data Reviewed:  CBC Recent Labs  Lab 03/16/19 2359 03/19/19 0220 03/19/19 2315 03/20/19 0430 03/21/19 0250 03/22/19 0236 03/23/19 0228  WBC 29.8* 24.6*  --  15.9* 12.6* 11.1* 9.4  HGB 8.9* 7.5* 8.7* 8.5* 8.3* 9.0* 8.2*  HCT 28.7* 23.3* 26.8* 26.0* 25.2* 27.5* 25.8*  PLT 270 202  --  216 207 230 245  MCV 101.8* 98.3  --  98.1 96.6 97.5 97.0  MCH 31.6 31.6  --  32.1 31.8 31.9 30.8  MCHC 31.0 32.2  --  32.7 32.9 32.7 31.8  RDW 17.0* 17.0*  --  17.6* 17.2* 17.2* 17.1*  LYMPHSABS 0.7  --   --   --   --   --   --   MONOABS 2.0*  --   --   --   --   --   --   EOSABS 0.0  --   --   --   --   --   --   BASOSABS 0.1  --   --   --   --   --   --     Chemistries  Recent Labs  Lab 03/19/19 0815 03/20/19 0430 03/20/19 0745 03/20/19 1140 03/21/19 0250 03/22/19 0236 03/23/19 0228  NA 129* 130*  --   --  132* 134* 130*  K 5.2* 4.8 4.8 5.4* 3.8 3.6 3.5  CL 92* 93*  --   --  95* 94* 90*  CO2 21* 23  --   --  23 25 27   GLUCOSE 107* 87  --   --  91 111* 106*  BUN 91* 90*  --   --  87* 81* 77*  CREATININE 3.51* 2.83*  --   --  2.33* 2.00* 1.88*  CALCIUM 8.7* 8.7*  --   --  8.8* 8.8* 8.5*   ------------------------------------------------------------------------------------------------------------------ No results for input(s): CHOL, HDL, LDLCALC, TRIG, CHOLHDL, LDLDIRECT in the last  72 hours.  Lab Results  Component Value Date   HGBA1C 5.7 (H) 09/18/2017   ------------------------------------------------------------------------------------------------------------------ No results for input(s): TSH, T4TOTAL, T3FREE, THYROIDAB in the last 72 hours.  Invalid input(s):  FREET3 ------------------------------------------------------------------------------------------------------------------ No results for input(s): VITAMINB12, FOLATE, FERRITIN, TIBC, IRON, RETICCTPCT in the last 72 hours.  Coagulation profile Recent Labs  Lab 03/19/19 0220 03/20/19 0430 03/21/19 0250 03/22/19 0236 03/23/19 0228  INR 2.7* 2.2* 2.3* 2.6* 3.4*    No results for input(s): DDIMER in the last 72 hours.  Cardiac Enzymes No results for input(s): CKMB, TROPONINI, MYOGLOBIN in the last 168 hours.  Invalid input(s): CK ------------------------------------------------------------------------------------------------------------------    Component Value Date/Time   BNP 105.7 (H) 03/16/2019 2359   BNP 31.3 07/28/2015 0829    Micro Results Recent Results (from the past 240 hour(s))  Blood culture (routine x 2)     Status: Abnormal   Collection Time: 03/16/19 11:59 PM   Specimen: BLOOD  Result Value Ref Range Status   Specimen Description BLOOD RIGHT HAND  Final   Special Requests   Final    BOTTLES DRAWN AEROBIC AND ANAEROBIC Blood Culture results may not be optimal due to an inadequate volume of blood received in culture bottles   Culture  Setup Time   Final    GRAM POSITIVE COCCI IN CHAINS AEROBIC BOTTLE ONLY CRITICAL RESULT CALLED TO, READ BACK BY AND VERIFIED WITH: E. SINCLAIR, PHARMD AT 7517 ON 03/17/19 BY C. JESSUP, MT.    Culture (A)  Final    STREPTOCOCCUS GROUP G SUSCEPTIBILITIES PERFORMED ON PREVIOUS CULTURE WITHIN THE LAST 5 DAYS. Performed at [facility], [place] [address] [place],  [address]    Report Status 03/19/2019 FINAL  Final  Blood culture (routine x 2)     Status: Abnormal   Collection Time: 03/16/19 11:59 PM   Specimen: BLOOD  Result Value Ref Range Status   Specimen Description BLOOD LEFT HAND  Final   Special Requests   Final    BOTTLES DRAWN AEROBIC AND ANAEROBIC Blood Culture adequate volume   Culture  Setup Time   Final     GRAM POSITIVE COCCI IN CHAINS IN BOTH AEROBIC AND ANAEROBIC BOTTLES CRITICAL RESULT CALLED TO, READ BACK BY AND VERIFIED WITHBronwen Betters [facility] 9449 03/17/19 A BROWNING Performed at [facility], [place] [address] [place], [place] [address]    Culture STREPTOCOCCUS GROUP G (A)  Final   Report Status 03/19/2019 FINAL  Final   Organism ID, Bacteria STREPTOCOCCUS GROUP G  Final      Susceptibility   Streptococcus group g - MIC*    CLINDAMYCIN <=0.25 SENSITIVE Sensitive     AMPICILLIN <=0.25 SENSITIVE Sensitive     ERYTHROMYCIN <=0.12 SENSITIVE Sensitive     VANCOMYCIN 0.5 SENSITIVE Sensitive     CEFTRIAXONE <=0.12 SENSITIVE Sensitive     LEVOFLOXACIN 0.5 SENSITIVE Sensitive     PENICILLIN Value in next row Sensitive      SENSITIVE<=0.06    * STREPTOCOCCUS GROUP G  Blood Culture ID Panel (Reflexed)     Status: Abnormal   Collection Time: 03/16/19 11:59 PM  Result Value Ref Range Status   Enterococcus species NOT DETECTED NOT DETECTED Final   Listeria monocytogenes NOT DETECTED NOT DETECTED Final   Staphylococcus species NOT DETECTED NOT DETECTED Final   Staphylococcus aureus (BCID) NOT DETECTED NOT DETECTED Final   Streptococcus species DETECTED (A) NOT DETECTED Final    Comment: Not Enterococcus species, Streptococcus agalactiae, Streptococcus  pyogenes, or Streptococcus pneumoniae. CRITICAL RESULT CALLED TO, READ BACK BY AND VERIFIED WITH: Bronwen Betters PHARMD [id number] 03/17/19 A BROWNING    Streptococcus agalactiae NOT DETECTED NOT DETECTED Final   Streptococcus pneumoniae NOT DETECTED NOT DETECTED Final   Streptococcus pyogenes NOT DETECTED NOT DETECTED Final   Acinetobacter baumannii NOT DETECTED NOT DETECTED Final   Enterobacteriaceae species NOT DETECTED NOT DETECTED Final   Enterobacter cloacae complex NOT DETECTED NOT DETECTED Final   Escherichia coli NOT DETECTED NOT DETECTED Final   Klebsiella oxytoca NOT DETECTED NOT DETECTED Final   Klebsiella pneumoniae NOT DETECTED NOT  DETECTED Final   Proteus species NOT DETECTED NOT DETECTED Final   Serratia marcescens NOT DETECTED NOT DETECTED Final   Haemophilus influenzae NOT DETECTED NOT DETECTED Final   Neisseria meningitidis NOT DETECTED NOT DETECTED Final   Pseudomonas aeruginosa NOT DETECTED NOT DETECTED Final   Candida albicans NOT DETECTED NOT DETECTED Final   Candida glabrata NOT DETECTED NOT DETECTED Final   Candida krusei NOT DETECTED NOT DETECTED Final   Candida parapsilosis NOT DETECTED NOT DETECTED Final   Candida tropicalis NOT DETECTED NOT DETECTED Final    Comment: Performed at [facility], [place]. [address], [place], [place] [address]  SARS CORONAVIRUS 2 (TAT 6-24 HRS) Nasopharyngeal Nasopharyngeal Swab     Status: None   Collection Time: 03/17/19 12:32 AM   Specimen: Nasopharyngeal Swab  Result Value Ref Range Status   SARS Coronavirus 2 NEGATIVE NEGATIVE Final    Comment: (NOTE) SARS-CoV-2 target nucleic acids are NOT DETECTED. The SARS-CoV-2 RNA is generally detectable in upper and lower respiratory specimens during the acute phase of infection. Negative results do not preclude SARS-CoV-2 infection, do not rule out co-infections with other pathogens, and should not be used as the sole basis for treatment or other patient management decisions. Negative results must be combined with clinical observations, patient history, and epidemiological information. The expected result is Negative. Fact Sheet for Patients: SugarRoll.be Fact Sheet for Healthcare Providers: https://www.woods-mathews.com/ This test is not yet approved or cleared by the [place] FDA and  has been authorized for detection and/or diagnosis of SARS-CoV-2 by FDA under an Emergency Use Authorization (EUA). This EUA will remain  in effect (meaning this test can be used) for the duration of the COVID-19 declaration under Section 56 4(b)(1) of the Act, 21 U.S.C. section  360bbb-3(b)(1), unless the authorization is terminated or revoked sooner. Performed at [facility], [place] [address] [place], [place] [address]   Urine culture     Status: Abnormal   Collection Time: 03/17/19 12:54 AM   Specimen: Urine, Random  Result Value Ref Range Status   Specimen Description URINE, RANDOM  Final   Special Requests   Final    NONE Performed at [facility], [place] [address] [place], [place] [address]    Culture MULTIPLE SPECIES PRESENT, SUGGEST RECOLLECTION (A)  Final   Report Status 03/18/2019 FINAL  Final  Culture, blood (Routine X 2) w Reflex to ID Panel     Status: None (Preliminary result)   Collection Time: 03/19/19  4:02 PM   Specimen: BLOOD RIGHT HAND  Result Value Ref Range Status   Specimen Description BLOOD RIGHT HAND  Final   Special Requests   Final    BOTTLES DRAWN AEROBIC AND ANAEROBIC Blood Culture results may not be optimal due to an inadequate volume of blood received in culture bottles   Culture   Final    NO  GROWTH 4 DAYS Performed at [facility], [place] [address], [place], [place] [address]    Report Status PENDING  Incomplete  Culture, blood (Routine X 2) w Reflex to ID Panel     Status: None (Preliminary result)   Collection Time: 03/19/19  4:10 PM   Specimen: BLOOD RIGHT HAND  Result Value Ref Range Status   Specimen Description BLOOD RIGHT HAND  Final   Special Requests   Final    BOTTLES DRAWN AEROBIC AND ANAEROBIC Blood Culture results may not be optimal due to an inadequate volume of blood received in culture bottles   Culture   Final    NO GROWTH 4 DAYS Performed at [facility], [place] [name], [place],  [address]    Report Status PENDING  Incomplete    Radiology Reports DG Chest Port 1 View  Result Date: 03/16/2019 CLINICAL DATA:  Shortness of breath EXAM: PORTABLE CHEST 1 VIEW COMPARISON:  July 10, 2018 FINDINGS: The heart size is significantly enlarged. The patient is status post prior  median sternotomy. Aortic calcifications are noted. There is a multi lead right-sided pacemaker in place. There is vascular congestion without overt pulmonary edema. There is no pneumothorax or large pleural effusion. There is no acute osseous abnormality. IMPRESSION: Cardiomegaly with mild volume overload. Electronically Signed   By: Constance Holster M.D.   On: 03/16/2019 23:50   DG Ankle Right Port  Result Date: 03/20/2019 CLINICAL DATA:  Right ankle pain., Redness and swelling. EXAM: PORTABLE RIGHT ANKLE - 2 VIEW COMPARISON:  None. FINDINGS: There is no acute bony or joint abnormality. No soft tissue gas or radiopaque foreign body. Soft tissues are diffusely swollen. Atherosclerosis noted. No radiopaque foreign body. IMPRESSION: Diffuse soft tissue swelling about the ankle could be due to dependent change or cellulitis. The exam is otherwise negative. Electronically Signed   By: Inge Rise M.D.   On: 03/20/2019 14:51   ECHOCARDIOGRAM COMPLETE  Result Date: 03/19/2019   ECHOCARDIOGRAM REPORT   Patient Name:   Frank [name] Date of Exam: 03/19/2019 Medical Rec #:  [id number]       Height:       71.0 in Accession #:    [id number]      Weight:       345.7 lb Date of Birth:  1946/02/27      BSA:          2.66 m Patient Age:    50 years        BP:           99/43 mmHg Patient Gender: M               HR:           60 bpm. Exam Location:  Inpatient Procedure: 2D Echo, Cardiac Doppler, Color Doppler and Intracardiac            Opacification Agent                            MODIFIED REPORT: This report was modified by Eleonore Chiquito MD on 03/19/2019 due to error.  Indications:     Bacteremia.  History:         Patient has prior history of Echocardiogram examinations, most                  recent 02/14/2019. Non- ischemic cardiomyopathy and CHF,  Pacemaker and Abnormal ECG, TIA and Pulmonary HTN, Mitral Valve                  Disease, Arrythmias:Complete heart block; Risk                   Factors:Hypertension. Mitral Valve: mechanical valve is present                  in the mitral position.  Sonographer:     Roseanna Rainbow RDCS Referring Phys:  [id number] [name] Diagnosing Phys: Eleonore Chiquito MD  Sonographer Comments: Technically difficult study due to poor echo windows, Technically challenging study due to limited acoustic windows, patient is morbidly obese, suboptimal subcostal window, suboptimal apical window and suboptimal parasternal window.  Image acquisition challenging due to patient body habitus. IMPRESSIONS  1. No evidence of infective endocarditis on this study. A TEE is recommended to exclude endocarditis.  2. The patient is s/p myomectomy with mechanical MVR and AoV repair (reported in record). Overall, the EF remains unchanged with prior. The mechanical MV prosthesis is functioning normally. The AoV is severely calcified with restricted movement. I suspect there is at least mild to moderate aortic stenosis, but doppler interrogation on this study is suboptimal. The tricuspid regurgitation on this study is severe with systolic hepatic vein flow reversal (not well evaluated on last study). Findings compared directly with study from 02/14/2019.  3. Left ventricular ejection fraction, by visual estimation, is 55 to 60%. The left ventricle has normal function. There is mildly increased left ventricular hypertrophy.  4. Definity contrast agent was given IV to delineate the left ventricular endocardial borders.  5. Left ventricular diastolic function could not be evaluated.  6. Right ventricular volume overload.  7. The left ventricle has no regional wall motion abnormalities.  8. Global right ventricle has severely reduced systolic function.The right ventricular size is severely enlarged. No increase in right ventricular wall thickness.  9. Left atrial size was mild-moderately dilated. 10. Right atrial size was severely dilated. 11. The mitral valve has been repaired/replaced. No evidence of  mitral valve regurgitation. 12. Mechanical mitral valve present. Mean gradient 5 mmHG with heart rate 60 bpm. EOA 5 mmHG. Normal functioning prosthetic valve with stable gradients compared with prior. 13. The tricuspid valve is grossly normal. Tricuspid valve regurgitation is severe. 14. Aortic valve regurgitation is mild. Mild to moderate aortic valve stenosis. 15. The aortic valve is severely calcified with restricted leaflet motion. Doppler interrogation is poor on this study. Vmax 2.5 m/s, Mean gradient ~8.5 mmHG, but suspect this is severely underestimated. There is likely a mild to moderate degree of aortic stenosis present. 16. The pulmonic valve was grossly normal. Pulmonic valve regurgitation is not visualized. 17. Mild plaque invoving the aortic root. 18. Normal pulmonary artery systolic pressure. 19. The tricuspid regurgitant velocity is 1.76 m/s, and with an assumed right atrial pressure of 15 mmHg, the estimated right ventricular systolic pressure is normal at 27.4 mmHg. 20. A pacer wire is visualized in the RA and RV. FINDINGS  Left Ventricle: Left ventricular ejection fraction, by visual estimation, is 55 to 60%. The left ventricle has normal function. Definity contrast agent was given IV to delineate the left ventricular endocardial borders. The left ventricle has no regional wall motion abnormalities. There is mildly increased left ventricular hypertrophy. Concentric left ventricular hypertrophy. The interventricular septum is flattened in diastole ('D' shaped left ventricle), consistent with right ventricular volume overload. The left ventricular diastology could not be  evaluated due to mitral valve replacement/repair. Left ventricular diastolic function could not be evaluated. Right Ventricle: The right ventricular size is severely enlarged. No increase in right ventricular wall thickness. Global RV systolic function is has severely reduced systolic function. The tricuspid regurgitant velocity is  1.76 m/s, and with an assumed right atrial pressure of 15 mmHg, the estimated right ventricular systolic pressure is normal at 27.4 mmHg. Left Atrium: Left atrial size was mild-moderately dilated. Right Atrium: Right atrial size was severely dilated Pericardium: There is no evidence of pericardial effusion. Mitral Valve: The mitral valve has been repaired/replaced. No evidence of mitral valve regurgitation. MV peak gradient, 19.1 mmHg. Mechanical mitral valve present. Mean gradient 5 mmHG with heart rate 60 bpm. EOA 5 mmHG. Normal functioning prosthetic valve with stable gradients compared with prior. Tricuspid Valve: The tricuspid valve is grossly normal. Tricuspid valve regurgitation is severe. The flow in the hepatic veins is reversed during ventricular systole. Aortic Valve: The aortic valve has been repaired/replaced. Aortic valve regurgitation is mild. Mild to moderate aortic stenosis is present. Aortic valve mean gradient measures 8.5 mmHg. Aortic valve peak gradient measures 16.2 mmHg. Aortic valve area, by  VTI measures 2.40 cm. The aortic valve is severely calcified with restricted leaflet motion. Doppler interrogation is poor on this study. Vmax 2.5 m/s, Mean gradient ~8.5 mmHG, but suspect this is severely underestimated. There is likely a mild to moderate degree of aortic stenosis present. Pulmonic Valve: The pulmonic valve was grossly normal. Pulmonic valve regurgitation is not visualized. Pulmonic regurgitation is not visualized. Aorta: The aortic root is normal in size and structure. There is mild, layered plaque involving the aortic root. IAS/Shunts: No atrial level shunt detected by color flow Doppler. Additional Comments: A pacer wire is visualized in the right atrium and right ventricle.  LEFT VENTRICLE PLAX 2D LVIDd:         4.10 cm       Diastology LVIDs:         3.00 cm       LV e' lateral:   11.70 cm/s LV PW:         2.10 cm       LV E/e' lateral: 12.3 LV IVS:        1.70 cm       LV e'  medial:    8.59 cm/s LVOT diam:     2.20 cm       LV E/e' medial:  16.8 LV SV:         39 ml LV SV Index:   13.60 LVOT Area:     3.80 cm  LV Volumes (MOD) LV area d, A2C:    33.90 cm LV area d, A4C:    38.70 cm LV area s, A2C:    23.20 cm LV area s, A4C:    25.93 cm LV major d, A2C:   7.96 cm LV major d, A4C:   8.64 cm LV major s, A2C:   7.74 cm LV major s, A4C:   8.04 cm LV vol d, MOD A2C: 120.0 ml LV vol d, MOD A4C: 147.4 ml LV vol s, MOD A2C: 58.2 ml LV vol s, MOD A4C: 71.6 ml LV SV MOD A2C:     61.8 ml LV SV MOD A4C:     147.4 ml LV SV MOD BP:      71.1 ml RIGHT VENTRICLE            IVC RV S prime:  8.81 cm/s  IVC diam: 4.70 cm TAPSE (M-mode): 1.5 cm LEFT ATRIUM            Index       RIGHT ATRIUM           Index LA diam:      6.40 cm  2.41 cm/m  RA Area:     42.00 cm LA Vol (A2C): 108.0 ml 40.59 ml/m RA Volume:   177.00 ml 66.52 ml/m LA Vol (A4C): 147.0 ml 55.25 ml/m  AORTIC VALVE AV Area (Vmax):    2.51 cm AV Area (Vmean):   2.58 cm AV Area (VTI):     2.40 cm AV Vmax:           201.50 cm/s AV Vmean:          132.000 cm/s AV VTI:            0.419 m AV Peak Grad:      16.2 mmHg AV Mean Grad:      8.5 mmHg LVOT Vmax:         133.00 cm/s LVOT Vmean:        89.600 cm/s LVOT VTI:          0.264 m LVOT/AV VTI ratio: 0.63  AORTA Ao Root diam: 3.70 cm Ao Asc diam:  3.40 cm MITRAL VALVE                        TRICUSPID VALVE MV Area (PHT): 2.48 cm             TV Peak grad:   17.1 mmHg MV Peak grad:  19.1 mmHg            TV Mean grad:   5.0 mmHg MV Mean grad:  5.0 mmHg             TV Vmax:        2.07 m/s MV Vmax:       2.18 m/s             TV Vmean:       93.2 cm/s MV Vmean:      93.5 cm/s            TV VTI:         0.61 msec MV VTI:        0.54 m               TR Peak grad:   12.4 mmHg MV PHT:        88.74 msec           TR Vmax:        178.00 cm/s MV Decel Time: 306 msec MV E velocity: 144.00 cm/s 103 cm/s SHUNTS                                     Systemic VTI:  0.26 m                                      Systemic Diam: 2.20 cm  Eleonore Chiquito MD Electronically signed by Eleonore Chiquito MD Signature Date/Time: 03/19/2019/11:50:41 AM    Final (Updated)    ECHO TEE  Result Date: 03/21/2019   TRANSESOPHOGEAL ECHO REPORT   Patient Name:   Iyad A Zick Date of  Exam: 03/21/2019 Medical Rec #:  [id number]       Height:       71.0 in Accession #:    [id number]      Weight:       341.5 lb Date of Birth:  12-Sep-1945      BSA:          2.65 m Patient Age:    32 years        BP:           111/45 mmHg Patient Gender: M               HR:           62 bpm. Exam Location:  Inpatient  Procedure: Transesophageal Echo Indications:    bacteremia  History:        Patient has prior history of Echocardiogram examinations, most                 recent 03/19/2019. 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  Sonographer:    Jannett Celestine RDCS (AE) Referring Phys: [place]: Consent was requested emergently by emergency room physicain. The transesophogeal probe was passed through the esophogus of the patient. The patient's vital signs; including heart rate, blood pressure, and oxygen saturation; remained stable throughout the procedure. The patient developed no complications during the procedure. IMPRESSIONS  1. Left ventricular ejection fraction, by visual estimation, is 55 to 60%. The left ventricle has normal function. Normal left ventricular size. There is no left ventricular hypertrophy.  2. The left ventricle has no regional wall motion abnormalities.  3. Global right ventricle has normal systolic function.The right ventricular size is normal. No increase in right ventricular wall thickness.  4. Both RA and RV pacer wires are normal. No vegetations.  5. Left atrial size was moderately dilated.  6. Right atrial size was normal.  7. The mitral valve has been repaired/replaced. No  evidence of mitral valve regurgitation. No evidence of mitral stenosis.  8. Mechanical mitral valve is functioning normally. No abscess. No rocking.  9. The tricuspid valve is normal in structure. Tricuspid valve regurgitation is severe. 10. The aortic valve is tricuspid. Aortic valve regurgitation is mild. Mild to moderate aortic valve sclerosis/calcification without any evidence of aortic stenosis. 11. The pulmonic valve was normal in structure. Pulmonic valve regurgitation is mild. 12. A pacer wire is visualized. 13. The inferior vena cava is normal in size with greater than 50% respiratory variability, suggesting right atrial pressure of 3 mmHg. 14. No vegetations. No endocarditis. FINDINGS  Left Ventricle: Left ventricular ejection fraction, by visual estimation, is 55 to 60%. The left ventricle has normal function. The left ventricle has no regional wall motion abnormalities. There is no left ventricular hypertrophy. Normal left ventricular size. Normal left atrial pressure. Right Ventricle: The right ventricular size is normal. No increase in right ventricular wall thickness. Global RV systolic function is has normal systolic function. Both RA and RV pacer wires are normal. No vegetations. Left Atrium: Left atrial size was moderately dilated. Smoke noted in LA. Right Atrium: Right atrial size was normal in size Pericardium: There is no evidence of pericardial effusion. Mitral Valve: The mitral valve has  been repaired/replaced. No evidence of mitral valve stenosis by observation. MV peak gradient, 18.5 mmHg. No evidence of mitral valve regurgitation. Mechanical mitral valve is functioning normally. No abscess. No rocking. Tricuspid Valve: The tricuspid valve is normal in structure. Tricuspid valve regurgitation is severe. Aortic Valve: The aortic valve is tricuspid. Aortic valve regurgitation is mild. Mild to moderate aortic valve sclerosis/calcification is present, without any evidence of aortic stenosis.  Pulmonic Valve: The pulmonic valve was normal in structure. Pulmonic valve regurgitation is mild. Aorta: The aortic root, ascending aorta and aortic arch are all structurally normal, with no evidence of dilitation or obstruction. Venous: The inferior vena cava is normal in size with greater than 50% respiratory variability, suggesting right atrial pressure of 3 mmHg. Shunts: There is no evidence of a patent foramen ovale. No ventricular septal defect is seen or detected. There is no evidence of an atrial septal defect. No atrial level shunt detected by color flow Doppler. Additional Comments: No vegetations. No endocarditis. A pacer wire is visualized.  MITRAL VALVE             Normals MV Peak grad: 18.5 mmHg  4 mmHg MV Mean grad: 7.0 mmHg MV Vmax:      2.15 m/s MV Vmean:     125.0 cm/s MV VTI:       0.43 m  Candee Furbish MD Electronically signed by Candee Furbish MD Signature Date/Time: 03/21/2019/2:26:09 PM    Final    CUP PACEART REMOTE DEVICE CHECK  Result Date: 03/06/2019 Scheduled remote reviewed.  Normal device function.  No atrial arrhythmia detected since last remote on 02/25/2019 Next remote 91 days. Kathy Breach, RN, CCDS, [employer]  VAS [name] LOWER EXTREMITY VENOUS (DVT)  Result Date: 03/17/2019  Lower Venous Study Indications: Swelling, and Edema.  Limitations: Body habitus and poor ultrasound/tissue interface. Comparison Study: no prior Performing Technologist: Abram Sander RVS  Examination Guidelines: A complete evaluation includes B-mode imaging, spectral Doppler, color Doppler, and power Doppler as needed of all accessible portions of each vessel. Bilateral testing is considered an integral part of a complete examination. Limited examinations for reoccurring indications may be performed as noted.  +---------+---------------+---------+-----------+----------+--------------+ RIGHT    CompressibilityPhasicitySpontaneityPropertiesThrombus Aging  +---------+---------------+---------+-----------+----------+--------------+ CFV      Full           Yes      Yes                                 +---------+---------------+---------+-----------+----------+--------------+ SFJ      Full                                                        +---------+---------------+---------+-----------+----------+--------------+ FV Prox  Full                                                        +---------+---------------+---------+-----------+----------+--------------+ FV Mid                  Yes      Yes                                 +---------+---------------+---------+-----------+----------+--------------+  FV Distal               Yes      Yes                                 +---------+---------------+---------+-----------+----------+--------------+ PFV      Full                                                        +---------+---------------+---------+-----------+----------+--------------+ POP      Full           Yes      Yes                                 +---------+---------------+---------+-----------+----------+--------------+ PTV                                                   Not visualized +---------+---------------+---------+-----------+----------+--------------+ PERO                                                  Not visualized +---------+---------------+---------+-----------+----------+--------------+   +----+---------------+---------+-----------+----------+--------------+ LEFTCompressibilityPhasicitySpontaneityPropertiesThrombus Aging +----+---------------+---------+-----------+----------+--------------+ CFV                Yes      Yes                                 +----+---------------+---------+-----------+----------+--------------+     Summary: Right: There is no evidence of deep vein thrombosis in the lower extremity. However, portions of this examination were limited- see  technologist comments above. No cystic structure found in the popliteal fossa. Left: No evidence of common femoral vein obstruction.  *See table(s) above for measurements and observations. Electronically signed by Servando Snare MD on 03/17/2019 at 5:03:22 PM.    Final    [name] EKG SITE RITE  Result Date: 03/22/2019 If Site Rite image not attached, placement could not be confirmed due to current cardiac rhythm.    Time Spent in minutes  30     Desiree Hane M.D on 03/23/2019 at 9:21 AM  To page go to [email or web address] - password [facility]

## 2019-03-24 DIAGNOSIS — I50812 Chronic right heart failure: Secondary | ICD-10-CM

## 2019-03-24 DIAGNOSIS — R238 Other skin changes: Secondary | ICD-10-CM

## 2019-03-24 DIAGNOSIS — B9561 Methicillin susceptible Staphylococcus aureus infection as the cause of diseases classified elsewhere: Secondary | ICD-10-CM

## 2019-03-24 DIAGNOSIS — N342 Other urethritis: Secondary | ICD-10-CM

## 2019-03-24 LAB — CULTURE, BLOOD (ROUTINE X 2)
Culture: NO GROWTH
Culture: NO GROWTH

## 2019-03-24 LAB — CBC
HCT: 27.2 % — ABNORMAL LOW (ref 39.0–52.0)
Hemoglobin: 8.6 g/dL — ABNORMAL LOW (ref 13.0–17.0)
MCH: 31.2 pg (ref 26.0–34.0)
MCHC: 31.6 g/dL (ref 30.0–36.0)
MCV: 98.6 fL (ref 80.0–100.0)
Platelets: 282 10*3/uL (ref 150–400)
RBC: 2.76 MIL/uL — ABNORMAL LOW (ref 4.22–5.81)
RDW: 17.1 % — ABNORMAL HIGH (ref 11.5–15.5)
WBC: 9.5 10*3/uL (ref 4.0–10.5)
nRBC: 0.6 % — ABNORMAL HIGH (ref 0.0–0.2)

## 2019-03-24 LAB — BASIC METABOLIC PANEL
Anion gap: 12 (ref 5–15)
BUN: 69 mg/dL — ABNORMAL HIGH (ref 8–23)
CO2: 27 mmol/L (ref 22–32)
Calcium: 8.8 mg/dL — ABNORMAL LOW (ref 8.9–10.3)
Chloride: 94 mmol/L — ABNORMAL LOW (ref 98–111)
Creatinine, Ser: 1.62 mg/dL — ABNORMAL HIGH (ref 0.61–1.24)
GFR calc Af Amer: 49 mL/min — ABNORMAL LOW (ref >60)
GFR calc non Af Amer: 42 mL/min — ABNORMAL LOW (ref >60)
Glucose, Bld: 112 mg/dL — ABNORMAL HIGH (ref 70–99)
Potassium: 4 mmol/L (ref 3.5–5.1)
Sodium: 133 mmol/L — ABNORMAL LOW (ref 135–145)

## 2019-03-24 LAB — PROTIME-INR
INR: 3.1 — ABNORMAL HIGH (ref 0.8–1.2)
Prothrombin Time: 32.3 seconds — ABNORMAL HIGH (ref 11.4–15.2)

## 2019-03-24 MED ORDER — PENICILLIN G POTASSIUM 20000000 UNITS IJ SOLR
12.0000 10*6.[IU] | Freq: Two times a day (BID) | INTRAVENOUS | Status: DC
  Administered 2019-03-24 – 2019-03-31 (×14): 12 10*6.[IU] via INTRAVENOUS
  Filled 2019-03-24 (×13): qty 12
  Filled 2019-03-24: qty 5
  Filled 2019-03-24 (×3): qty 12

## 2019-03-24 MED ORDER — WARFARIN SODIUM 10 MG PO TABS
10.0000 mg | ORAL_TABLET | Freq: Once | ORAL | Status: AC
  Administered 2019-03-24: 10 mg via ORAL

## 2019-03-24 MED ORDER — FUROSEMIDE 10 MG/ML IJ SOLN
60.0000 mg | Freq: Two times a day (BID) | INTRAMUSCULAR | Status: DC
  Administered 2019-03-24 – 2019-03-27 (×6): 60 mg via INTRAVENOUS
  Filled 2019-03-24 (×6): qty 6

## 2019-03-24 MED ORDER — METOLAZONE 5 MG PO TABS
5.0000 mg | ORAL_TABLET | Freq: Every day | ORAL | Status: DC
  Administered 2019-03-24 – 2019-03-27 (×4): 5 mg via ORAL
  Filled 2019-03-24 (×5): qty 1

## 2019-03-24 NOTE — Progress Notes (Signed)
ANTICOAGULATION CONSULT NOTE - Follow Up Consult  Pharmacy Consult for Coumadin Indication: MVR and afib.  Allergies  Allergen Reactions  . Warfarin And Related Other (See Comments)    COUMADIN or JANTOVEN BRAND ONLY** Per pt, was switched to generic in 2001 and had significantly decreased absorption > INR subtherapeutic> pt suffered a TIA and has been on brand name only since     Patient Measurements: Height: 5\' 11"  (180.3 cm) Weight: (!) 344 lb (156 kg) IBW/kg (Calculated) : 75.3  Vital Signs: Temp: 98.9 F (37.2 C) (12/14 0756) Temp Source: Oral (12/14 0756) BP: 124/53 (12/14 0800) Pulse Rate: 65 (12/14 0756)  Labs: Recent Labs    03/21/19 1531 03/22/19 0236 03/23/19 0228 03/24/19 0326  HGB  --  9.0* 8.2* 8.6*  HCT  --  27.5* 25.8* 27.2*  PLT  --  230 245 282  LABPROT  --  27.5* 34.1* 32.3*  INR  --  2.6* 3.4* 3.1*  HEPARINUNFRC 0.44 0.49 0.63  --   CREATININE  --  2.00* 1.88* 1.62*    Estimated Creatinine Clearance: 64.6 mL/min (A) (by C-G formula based on SCr of 1.62 mg/dL (H)).   Assessment:  Anticoag: Couamdin for mech MVR and Afib.  Chronic intermittent rectal/hemorrhoidal bleeding. INR 3.1 today. Hgb 8.6 stable. Plts WNL. - Home warfarin: 15mg  sun/wed, 10mg  AOD Use COUMADIN or JANTOVEN BRAND ONLY (See note for more detail)  - 12/12 Coumadin 10 mg administration not shown in MAR, but was given as reported by nurse  Goal of Therapy:  INR 2.5-3.5 Monitor platelets by anticoagulation protocol: Yes   Plan:  Coumadin 10 mg today  **Use home supply of brand Coumadin** 10 mg tablets in Main Rx (INR goal 2.5-3.5).  -Pen G changed to 24 million units/day continuous infusion.  Alle Difabio S. Alford Highland, PharmD, BCPS Clinical Staff Pharmacist Eilene Ghazi Stillinger 03/24/2019,11:06 AM

## 2019-03-24 NOTE — Progress Notes (Signed)
PHARMACY NOTE:  ANTIMICROBIAL RENAL DOSAGE ADJUSTMENT  Current antimicrobial regimen includes a mismatch between antimicrobial dosage and estimated renal function.  As per policy approved by the Pharmacy & Therapeutics and Medical Executive Committees, the antimicrobial dosage will be adjusted accordingly.  Current antimicrobial dosage:  Penicillin 16 million units every 24 hours as a continuous  Infusion   Indication: Group G strep bacteremia  Renal Function:  Estimated Creatinine Clearance: 64.6 mL/min (A) (by C-G formula based on SCr of 1.62 mg/dL (H)). []      On intermittent HD, scheduled: []      On CRRT    Antimicrobial dosage has been changed to:  Penicillin G 24 million units every 24 hours as a continuous infusion   Additional comments: Renal function improved    Thank you for allowing pharmacy to be a part of this patient's care.  Jimmy Footman, PharmD, BCPS, BCIDP Infectious Diseases Clinical Pharmacist Phone: [phone] 03/24/2019 9:27 AM

## 2019-03-24 NOTE — Progress Notes (Signed)
PHARMACY CONSULT NOTE FOR:  OUTPATIENT  PARENTERAL ANTIBIOTIC THERAPY (OPAT)   Indication: strep G bacteremia with ICD in place (no vegetations noted) Regimen: 24 million units once daily as a continuous infusion  End date: 04/01/2019  IV antibiotic discharge orders are pended. To discharging provider:  please sign these orders via discharge navigator,  Select New Orders & click on the button choice - Manage This Unsigned Work.     Thank you for allowing pharmacy to be a part of this patient's care.   Jimmy Footman, PharmD, BCPS, BCIDP Infectious Diseases Clinical Pharmacist Phone: [phone] Please check amion for clinical pharmacist contact number   03/24/2019, 9:26 AM

## 2019-03-24 NOTE — Progress Notes (Signed)
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 Found with sepsis from UTI and possible cellulitis of R LE, CHF.    PT Comments    Patient seen for mobility progression. Pt in bed upon arrival and agreeable to participate in therapy. Pt's wife present throughout session and assisted with mobility. Pt OOB mobility limited this session by very painful R LE with weight bearing and in dependent position. Pt requires min A for bed mobility with HOB elevated and min guard to stand from elevated bed height. Continue to progress as tolerated.     Follow Up Recommendations  Home health PT;Supervision - Intermittent     Equipment Recommendations  Rolling walker with 5" wheels(bari)    Recommendations for Other Services       Precautions / Restrictions Precautions Precautions: Fall Precaution Comments: watch BP Restrictions Weight Bearing Restrictions: No    Mobility  Bed Mobility Overal bed mobility: Needs Assistance Bed Mobility: Sit to Supine;Supine to Sit     Supine to sit: HOB elevated;Min assist Sit to supine: Min assist   General bed mobility comments: wife present and assisted pt to elevate trunk into sitting after pt brought bilat LE to EOB; assistance needed to bring R LE into bed   Transfers Overall transfer level: Needs assistance Equipment used: Rolling walker (2 wheeled) Transfers: Sit to/from Stand Sit to Stand: Min guard;From elevated surface         General transfer comment: very hight bed height to stand; min guard for safety; bilat LE braced against bed while  wife assisted with use of urinal; assist to stabilize RW and cues for safe use of AD in standing  Ambulation/Gait             General Gait Details: unable to ambulate this session due to painful R LE    Stairs             Wheelchair Mobility    Modified Rankin (Stroke Patients Only)       Balance Overall balance assessment: Needs assistance Sitting-balance support: No upper extremity supported;Feet supported Sitting balance-Leahy Scale: Good     Standing balance support: Bilateral upper extremity supported Standing balance-Leahy Scale: Poor Standing balance comment: reliant on BUE support                            Cognition Arousal/Alertness: Awake/alert Behavior During Therapy: WFL for tasks assessed/performed Overall Cognitive Status: Within Functional Limits for tasks assessed                                        Exercises      General Comments General comments (skin integrity, edema, etc.): R LE erythema, edema, and blisters with drainage      Pertinent Vitals/Pain Pain Assessment: Faces Faces Pain Scale: Hurts whole lot Pain Location: R LE with weight bearing  Pain Descriptors / Indicators: Sore;Tender;Grimacing;Guarding Pain Intervention(s): Limited activity within patient's tolerance;Monitored during session;Repositioned;Premedicated before session    Home Living  Prior Function            PT Goals (current goals can now be found in the care plan section) Progress towards PT goals: Progressing toward goals    Frequency    Min 3X/week      PT Plan Current plan remains appropriate    Co-evaluation              AM-PAC PT "6 Clicks" Mobility   Outcome Measure  Help needed turning from your back to your side while in a flat bed without using bedrails?: A Little Help needed moving from lying on your back to sitting on the side of a flat bed without using bedrails?: A  Little Help needed moving to and from a bed to a chair (including a wheelchair)?: A Little Help needed standing up from a chair using your arms (e.g., wheelchair or bedside chair)?: A Little Help needed to walk in hospital room?: Total Help needed climbing 3-5 steps with a railing? : Total 6 Click Score: 14    End of Session   Activity Tolerance: Patient limited by pain Patient left: with call bell/phone within reach;in bed;with family/visitor present Nurse Communication: Mobility status PT Visit Diagnosis: Muscle weakness (generalized) (M62.81);Difficulty in walking, not elsewhere classified (R26.2);Pain Pain - Right/Left: Right Pain - part of body: Leg     Time: 8250-5397 PT Time Calculation (min) (ACUTE ONLY): 40 min  Charges:  $Gait Training: 8-22 mins $Therapeutic Activity: 23-37 mins                     Earney Navy, PTA Acute Rehabilitation Services Pager: [phone] Office: [phone]     Darliss Cheney 03/24/2019, 4:16 PM

## 2019-03-24 NOTE — TOC Initial Note (Signed)
Transition of Care [facility]) - Initial/Assessment Note    Patient Details  Name: Frank [name] MRN: [id number] Date of Birth: [birth year]  [facility] CM/SW Contact:    Carles Collet, RN Phone Number: 03/24/2019, 12:26 PM  Clinical Narrative:               Plan for patient to DC to home with IV Abx. Patient will need [facility] RN for IV Abx and assistance with dressing changes to RLE, cellulitis with large weeping blisters.  Notified Pam with [facility] of need for continuous IV PCN.  Printed Medicare list for patient review, he DOES NOT want [facility] but could not choose agency yet, will review with wife. I will follow up with him later today.  PICC unsuccessful at bedside, patient states that he will get it IR. He states he was informed he will DC Wednesday.        Expected Discharge Plan: [place] Barriers to Discharge: Continued Medical Work up   Patient Goals and CMS Choice Patient states their goals for this hospitalization and ongoing recovery are:: to go home CMS Medicare.gov Compare Post Acute Care list provided to:: Patient Choice offered to / list presented to : Patient  Expected Discharge Plan and Services Expected Discharge Plan: [place]   Discharge Planning Services: CM Consult Post Acute Care Choice: Home Health, Durable Medical Equipment                             HH Arranged: RN          Prior Living Arrangements/Services   Lives with:: Spouse                   Activities of Daily Living Home Assistive Devices/Equipment: Eyeglasses ADL Screening (condition at time of admission) Patient's cognitive ability adequate to safely complete daily activities?: No Is the patient deaf or have difficulty hearing?: No Does the patient have difficulty seeing, even when wearing glasses/contacts?: No Does the patient have difficulty concentrating, remembering, or making decisions?: No Patient able to  express need for assistance with ADLs?: Yes Does the patient have difficulty dressing or bathing?: No Independently performs ADLs?: Yes (appropriate for developmental age) Does the patient have difficulty walking or climbing stairs?: Yes Weakness of Legs: Both Weakness of Arms/Hands: None  Permission Sought/Granted                  Emotional Assessment              Admission diagnosis:  Coagulopathy ([place]) [D68.9] Acute pulmonary edema ([place]) [J81.0] Weakness [R53.1] Acute cystitis without hematuria [N30.00] AKI (acute kidney injury) ([place]) [N17.9] Gastrointestinal hemorrhage associated with anorectal source [K62.5] Sepsis ([place]) [A41.9] Patient Active Problem List   Diagnosis Date Noted  . [place] 03/23/2019  . Hypotension 03/20/2019  . Group G streptococcal infection 03/19/2019  . Bacteremia 03/19/2019  . Sepsis ([place]) 03/17/2019  . UTI (urinary tract infection) 03/17/2019  . Acute respiratory failure with hypoxia ([place]) 03/17/2019  . Acute exacerbation of CHF (congestive heart failure) ([place]) 03/17/2019  . AKI (acute kidney injury) ([place]) 03/17/2019  . Change in bowel habits   . Rectal bleeding   . Pulmonary hypertension ([place]) 12/17/2017  . Complete heart block ([place]) 07/28/2017  . OSA (obstructive sleep apnea) 06/08/2017  . NICM (nonischemic cardiomyopathy) ([place]) 05/28/2017  . Chronic combined systolic and diastolic heart failure ([place]) 01/18/2017  .  Dyspnea on exertion 07/12/2015  . Encounter for therapeutic drug monitoring 05/19/2013  . Cardiac pacemaker-St.Jude 06/22/2011  . S/P mitral valve replacement-Mechanical 10/10/2010  . Atrial fibrillation/tachycardia 06/08/2010  . TIA (transient ischemic attack) 06/08/2010  . S/P aortic valve repair 06/08/2010  . DIASTOLIC HEART FAILURE, CHRONIC 08/10/2009  . VENTRICULAR TACHYCARDIA 11/23/2008  . MORBID OBESITY 06/10/2007  . Essential hypertension 06/10/2007  . Hypertrophic obstructive cardiomyopathy ([place]) 06/10/2007   . DYSPNEA 06/10/2007  . CT, CHEST, ABNORMAL 06/10/2007  . MELANOMA, HX OF 06/10/2007   PCP:  [name], MD Pharmacy:   [facility] [address], [place] - [address] [place] [place] [address] Phone: [phone] Fax: [phone]  [place], [place] [address] [place] [address] Phone: [phone] Fax: [phone]     Social Determinants of Health (SDOH) Interventions    Readmission Risk Interventions No flowsheet data found.

## 2019-03-24 NOTE — Consult Note (Addendum)
Cardiology Consultation:   Patient ID: JAHAZIEL FRANCOIS MRN: [id number]; DOB: 1945/11/25  Admit date: 03/16/2019 Date of Consult: 03/24/2019  Primary Care Provider: [name], MD Primary Cardiologist: [name], MD  Primary Electrophysiologist:  None    Patient Profile:   Frank [name] is a 73 y.o. male with a hx of hypertrophic cardiomyopathy status post myectomy with mechanical mitral valve replacement and aortic valve repair, complete heart block s/p CRT device 05/2017 with pocket infection with explant 06/2017, implant BiV pacemaker 07/2017 who is being seen today for the evaluation of CHF at the request of Dr. Lonny Prude.  History of Present Illness:   Mr. Benyo is a 73 year old male with past medical history noted above.  He is followed by Dr. [name] as an outpatient.  He presented back in February 2019 for an upgrade of his CRT device as it had reached ERI.  Unfortunately device pocket became infected and he had to undergo CRT explant 06/2017 with placement of a temp pacer.  Presented back to the ER 07/2017 with weakness and heart rate noted in the 30s with Micra lead dislodgment noted on chest x-ray.  He was admitted for PPM placement.  Underwent removal of previous lead under fluoroscopic guidance and placement of BiV pacemaker insertion CRT-P without complications.   He was last seen through a telehealth visit with Dr. [name] on 01/31/2019.  Reported worsening volume overload.  His losartan was decreased from 100 mg daily to 50 mg daily with continuing weekly use of metolazone.  It was suggested he be reevaluated for pulmonary hypertension again as his PA pressures were notably high.  Plan was for repeat echocardiogram within 1 month.  Presented to the ED on 03/16/2019 after recent colonoscopy with generalized malaise, chills, shortness of breath, cough and intermittent rectal bleeding.  He was found to be febrile with a temperature of 103, tachypneic and hypoxic with sats in the  80s requiring 2 L.  WBC 29, lactic acid 2.8, creatinine 2.5 with positive fecal occult stool.  UA was remarkable for large leukocytes.  He was admitted with a working diagnosis of sepsis secondary to UTI.  Hospital course complicated by group G strep bacteremia with right lower extremity cellulitis.  ID was consulted, recommended TEE given his prosthetic valves which were fortunately negative.  Blood cultures have remained negative.  ID has been following and arranged for outpatient antibiotic therapy.   Recent chest x-ray with mild volume overload, but continued significant bilateral lower extremity edema.  He has been on IV Lasix 40 mg twice daily, but weight is up.  Echocardiogram 12/9 showed an EF of 55 to 60% with mildly increased LVH, moderately dilated LA and severely dilated RA.  Mechanical valve with normal function and stable gradients, aortic valve with mild to moderate aortic stenosis.  Heart Pathway Score:     Past Medical History:  Diagnosis Date   Atrial arrhythmia    status post ablation in [place] with complication including damage to the aortic valve   Atrial fibrillation (HCC)    AV block, 1st degree    Bradycardia    Cluster headache ~ 1980   "went away after I started taking one of the heart RXs" (7/93/9030)   Diastolic CHF, chronic (HCC)    Diverticulosis    History of gout    HTN (hypertension)    Hypertrophic cardiomyopathy ([place])    s/p myoectomy   Infection    Internal hemorrhoids    Morbid obesity ([place])  Morbid obesity with BMI of 45.0-49.9, adult ([place])    Pacemaker  -SJM    Skin cancer    "burned off RUE X 1; cut off nose X 2" (06/29/2017)   TIA (transient ischemic attack) 2001   Tubulovillous adenoma     Past Surgical History:  Procedure Laterality Date   AORTIC VALVE REPAIR  07/22/1997   mechanical-following traumatic injury during an ablation procedure   BIV PACEMAKER INSERTION CRT-P N/A 07/27/2017   Procedure: BIV PACEMAKER INSERTION CRT-P;  Surgeon:  [name], MD;  Location: [place] [facility];  Service: Cardiovascular;  Laterality: N/A;   BIV UPGRADE N/A 05/28/2017   Procedure: BIV UPGRADE;  Surgeon: [name], MD;  Location: [place] [facility];  Service: Cardiovascular;  Laterality: N/A;   CARDIAC CATHETERIZATION     "couple times" (06/29/2017)   CARDIAC VALVE REPLACEMENT     COLONOSCOPY     COLONOSCOPY WITH PROPOFOL N/A 04/13/2016   Procedure: COLONOSCOPY WITH PROPOFOL;  Surgeon: Gatha Mayer, MD;  Location: [facility];  Service: Endoscopy;  Laterality: N/A;   COLONOSCOPY WITH PROPOFOL N/A 02/28/2019   Procedure: COLONOSCOPY WITH PROPOFOL;  Surgeon: Gatha Mayer, MD;  Location: [facility];  Service: Endoscopy;  Laterality: N/A;   INSERT / REPLACE / REMOVE PACEMAKER  ~ 2010   St Jude Accent    MITRAL VALVE REPLACEMENT  07/22/1997   mechanical; St. Jude   PACEMAKER LEAD REMOVAL N/A 06/29/2017   Procedure: [id number]  REMOVAL AND Four LEAD REMOVAL;  Surgeon: Evans Lance, MD;  Location: [facility];  Service: Cardiovascular;  Laterality: N/A;   PACEMAKER REMOVAL  06/29/2017   GERNERATOR  REMOVAL AND Four LEAD REMOVAL; put in external pacemaker (06/29/2017)   septal myectomy  07/22/1997   SKIN CANCER EXCISION     TEE WITHOUT CARDIOVERSION N/A 03/21/2019   Procedure: TRANSESOPHAGEAL ECHOCARDIOGRAM (TEE);  Surgeon: Jerline Pain, MD;  Location: [facility];  Service: Cardiovascular;  Laterality: N/A;     Home Medications:  Prior to Admission medications   Medication Sig Start Date End Date Taking? Authorizing Provider  acetaminophen (TYLENOL) 325 MG tablet Take 2 tablets (650 mg total) by mouth every 6 (six) hours as needed for mild pain. 07/28/17  Yes Duke, Tami Lin, PA  Ascorbic Acid (VITAMIN C) 1000 MG tablet Take 2,000 mg by mouth daily.     Yes [provider]  aspirin 81 MG tablet Take 81 mg by mouth daily.   Yes [provider]  diphenhydramine-acetaminophen (TYLENOL PM) 25-500 MG TABS  tablet Take 2 tablets by mouth at bedtime as needed (for sleep).   Yes [provider]  ferrous sulfate 325 (65 FE) MG tablet Take 325 mg by mouth daily with breakfast.   Yes [provider]  fish oil-omega-3 fatty acids 1000 MG capsule Take 4 g by mouth every morning.    Yes [provider]  JANTOVEN 10 MG tablet Take as directed by [facility] Patient taking differently: Take 10-15 mg by mouth See admin instructions. Take as directed by [facility]; 15 mg on Sun and Wed; 10 mg all other days 12/20/18  Yes [name], MD  losartan (COZAAR) 50 MG tablet Take 1 tablet (50 mg total) by mouth daily. 02/21/19 05/22/19 Yes [name], MD  Magnesium 250 MG TABS Take 500-750 mg by mouth See admin instructions. 750 mg in the morning, 500 mg in the evening   Yes [provider]  metolazone (ZAROXOLYN)  5 MG tablet Take 1 tablet (5 mg total) by mouth See admin instructions. Once weekly if needed for swelling 02/21/19  Yes [name], MD  Multiple Vitamin (MULTIVITAMIN) tablet Take 1 tablet by mouth daily.     Yes [provider]  omeprazole (PRILOSEC) 20 MG capsule Take 20 mg by mouth daily.   Yes [provider]  potassium chloride (KLOR-CON) 10 MEQ tablet Take 13 tablets (130 mEq total) by mouth See admin instructions. Patient takes 13 tablets throughout the day Take 144mEq on days you take Metolazone 02/21/19  Yes [name], MD  spironolactone (ALDACTONE) 25 MG tablet Take 1 tablet (25 mg total) by mouth daily. 02/21/19  Yes [name], MD  torsemide (DEMADEX) 20 MG tablet Take 3 tablets (60 mg total) by mouth 2 (two) times daily. 02/21/19  Yes [name], MD  Wheat Dextrin (BENEFIBER) CHEW Chew 3 tablets by mouth daily.   Yes [provider]  enoxaparin (LOVENOX) 150 MG/ML injection Inject 1 mL (150 mg total) into the skin every 12 (twelve) hours. 02/20/19 03/16/19  [name], MD  fondaparinux (ARIXTRA)  10 MG/0.8ML SOLN injection Inject 0.8 mLs (10 mg total) into the skin daily. 02/20/19 03/16/19  [name], MD    Inpatient Medications: Scheduled Meds:  furosemide  60 mg Intravenous BID   psyllium  1 packet Oral Daily   warfarin  10 mg Oral ONCE-1800   Warfarin - Pharmacist Dosing Inpatient   Does not apply q1800   Continuous Infusions:  penicillin g continuous IV infusion 12 Million Units (03/24/19 1214)   PRN Meds: acetaminophen, HYDROcodone-acetaminophen, HYDROmorphone (DILAUDID) injection, traMADol, witch hazel-glycerin  Allergies:    Allergies  Allergen Reactions   Warfarin And Related Other (See Comments)    COUMADIN or JANTOVEN BRAND ONLY** Per pt, was switched to generic in 2001 and had significantly decreased absorption > INR subtherapeutic> pt suffered a TIA and has been on brand name only since     Social History:   Social History   Socioeconomic History   Marital status: Married    Spouse name: Not on file   Number of children: 1   Years of education: Not on file   Highest education level: Not on file  Occupational History   Occupation: Corporate investment banker, retired    Fish farm manager: [employer]  Tobacco Use   Smoking status: Former Smoker    Years: 10.00    Types: Cigarettes, Pipe, Cigars    Quit date: 04/10/1977    Years since quitting: 41.9   Smokeless tobacco: Never Used  Substance and Sexual Activity   Alcohol use: Yes    Alcohol/week: 2.0 standard drinks    Types: 1 Glasses of wine, 1 Cans of beer per week   Drug use: No   Sexual activity: Not Currently  Other Topics Concern   Not on file  Social History Narrative   Married, one adopted son and one daughter. 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   2 caffeinated beverages daily   Social Determinants of Health   Financial Resource Strain:    Difficulty of Paying Living Expenses: Not on file  Food Insecurity:    Worried About [place]  in the Last Year: Not on file   Ran Out of Food in the Last Year: Not on file  Transportation Needs:    Lack of Transportation (Medical): Not on file  Lack of Transportation (Non-Medical): Not on file  Physical Activity:    Days of Exercise per Week: Not on file   Minutes of Exercise per Session: Not on file  Stress:    Feeling of Stress : Not on file  Social Connections:    Frequency of Communication with Friends and Family: Not on file   Frequency of Social Gatherings with Friends and Family: Not on file   Attends Religious Services: Not on file   Active Member of Clubs or Organizations: Not on file   Attends Archivist Meetings: Not on file   Marital Status: Not on file  Intimate Partner Violence:    Fear of Current or Ex-Partner: Not on file   Emotionally Abused: Not on file   Physically Abused: Not on file   Sexually Abused: Not on file    Family History:    Family History  Problem Relation Age of Onset   Heart disease Sister    Uterine cancer Sister    Obesity Sister        500+ lbs   Heart disease Brother    Heart disease Sister    Heart disease Father    Colon cancer Father 10   Crohn's disease Paternal Grandmother      ROS:  Please see the history of present illness.   All other ROS reviewed and negative.     Physical Exam/Data:   Vitals:   03/24/19 0350 03/24/19 0756 03/24/19 0800 03/24/19 1100  BP: (!) 115/56 (!) 124/53 (!) 124/53 (!) 139/58  Pulse:  65  63  Resp: 13 19  20   Temp: (!) 97.4 F (36.3 C) 98.9 F (37.2 C)  98.6 F (37 C)  TempSrc: Oral Oral  Oral  SpO2: 98% 96%  100%  Weight:      Height:        Intake/Output Summary (Last 24 hours) at 03/24/2019 1517 Last data filed at 03/24/2019 1207 Gross per 24 hour  Intake 990 ml  Output 500 ml  Net 490 ml   Last 3 Weights 03/23/2019 03/22/2019 03/21/2019  Weight (lbs) 344 lb 340 lb 13.3 oz 341 lb 8 oz  Weight (kg) 156.037 kg 154.6 kg 154.903 kg     Body mass index is 47.98  kg/m.  General: Morbidly obese older white male in no acute distress HEENT: normal Lymph: no adenopathy Neck: Occult to assess JVD secondary to neck girth Endocrine:  No thryomegaly Vascular: No carotid bruits; FA pulses 2+ bilaterally without bruits  Cardiac:  normal S1, S2; RRR; mechanical valve click, 2 out of 6 systolic murmur  Lungs:  clear to auscultation bilaterally, no wheezing, rhonchi or rales  Abd: soft, nontender, no hepatomegaly  Ext: Significant right lower extremity edema with extensive cellulitis and blistering. Musculoskeletal:  No deformities. Skin: warm and dry  Neuro:  CNs 2-12 intact, no focal abnormalities noted Psych:  Normal affect   EKG:  The EKG was personally reviewed and demonstrates: AV paced sinus bradycardia  Relevant CV Studies:  TTE: 03/19/2019   1. No evidence of infective endocarditis on this study. A TEE is recommended to exclude endocarditis.  2. The patient is s/p myomectomy with mechanical MVR and AoV repair (reported in record). Overall, the EF remains unchanged with prior. The mechanical MV prosthesis is functioning normally. The AoV is severely calcified with restricted movement. I  suspect there is at least mild to moderate aortic stenosis, but doppler interrogation on this study is suboptimal. The tricuspid regurgitation  on this study is severe with systolic hepatic vein flow reversal (not well evaluated on last study). Findings  compared directly with study from 02/14/2019.  3. Left ventricular ejection fraction, by visual estimation, is 55 to 60%. The left ventricle has normal function. There is mildly increased left ventricular hypertrophy.  4. Definity contrast agent was given IV to delineate the left ventricular endocardial borders.  5. Left ventricular diastolic function could not be evaluated.  6. Right ventricular volume overload.  7. The left ventricle has no regional wall motion abnormalities.  8. Global right ventricle has severely  reduced systolic function.The right ventricular size is severely enlarged. No increase in right ventricular wall thickness.  9. Left atrial size was mild-moderately dilated. 10. Right atrial size was severely dilated. 11. The mitral valve has been repaired/replaced. No evidence of mitral valve regurgitation. 12. Mechanical mitral valve present. Mean gradient 5 mmHG with heart rate 60 bpm. EOA 5 mmHG. Normal functioning prosthetic valve with stable gradients compared with prior. 13. The tricuspid valve is grossly normal. Tricuspid valve regurgitation is severe. 14. Aortic valve regurgitation is mild. Mild to moderate aortic valve stenosis. 15. The aortic valve is severely calcified with restricted leaflet motion. Doppler interrogation is poor on this study. Vmax 2.5 m/s, Mean gradient ~8.5 mmHG, but suspect this is severely underestimated. There is likely a mild to moderate degree of  aortic stenosis present. 16. The pulmonic valve was grossly normal. Pulmonic valve regurgitation is not visualized. 17. Mild plaque invoving the aortic root. 18. Normal pulmonary artery systolic pressure. 19. The tricuspid regurgitant velocity is 1.76 m/s, and with an assumed right atrial pressure of 15 mmHg, the estimated right ventricular systolic pressure is normal at 27.4 mmHg. 20. A pacer wire is visualized in the RA and RV.    Laboratory Data:  High Sensitivity Troponin:  No results for input(s): TROPONINIHS in the last 720 hours.   Chemistry Recent Labs  Lab 03/22/19 0236 03/23/19 0228 03/24/19 0326  NA 134* 130* 133*  K 3.6 3.5 4.0  CL 94* 90* 94*  CO2 25 27 27   GLUCOSE 111* 106* 112*  BUN 81* 77* 69*  CREATININE 2.00* 1.88* 1.62*  CALCIUM 8.8* 8.5* 8.8*  GFRNONAA 33* 35* 42*  GFRAA 38* 41* 49*  ANIONGAP 15 13 12     No results for input(s): PROT, ALBUMIN, AST, ALT, ALKPHOS, BILITOT in the last 168 hours. Hematology Recent Labs  Lab 03/22/19 0236 03/23/19 0228 03/24/19 0326  WBC 11.1*  9.4 9.5  RBC 2.82* 2.66* 2.76*  HGB 9.0* 8.2* 8.6*  HCT 27.5* 25.8* 27.2*  MCV 97.5 97.0 98.6  MCH 31.9 30.8 31.2  MCHC 32.7 31.8 31.6  RDW 17.2* 17.1* 17.1*  PLT 230 245 282   BNPNo results for input(s): BNP, PROBNP in the last 168 hours.  DDimer No results for input(s): DDIMER in the last 168 hours.   Radiology/Studies:  ECHO TEE  Result Date: 03/30/2019   TRANSESOPHOGEAL ECHO REPORT   Patient Name:   MAKENZIE VITTORIO Barabas Date of Exam: 30-Mar-2019 Medical Rec #:  [id number]       Height:       71.0 in Accession #:    [id number]      Weight:       341.5 lb Date of Birth:  [birth year]      BSA:          2.65 m Patient Age:    22 years        BP:  111/45 mmHg Patient Gender: M               HR:           62 bpm. Exam Location:  Inpatient  Procedure: Transesophageal Echo Indications:    bacteremia  History:        Patient has prior history of Echocardiogram examinations, most                 recent 03/19/2019. 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  Sonographer:    Jannett Celestine RDCS (AE) Referring Phys: [place]: Consent was requested emergently by emergency room physicain. The transesophogeal probe was passed through the esophogus of the patient. The patient's vital signs; including heart rate, blood pressure, and oxygen saturation; remained stable throughout the procedure. The patient developed no complications during the procedure. IMPRESSIONS  1. Left ventricular ejection fraction, by visual estimation, is 55 to 60%. The left ventricle has normal function. Normal left ventricular size. There is no left ventricular hypertrophy.  2. The left ventricle has no regional wall motion abnormalities.  3. Global right ventricle has normal systolic function.The right ventricular size is normal. No increase in right ventricular wall thickness.  4.  Both RA and RV pacer wires are normal. No vegetations.  5. Left atrial size was moderately dilated.  6. Right atrial size was normal.  7. The mitral valve has been repaired/replaced. No evidence of mitral valve regurgitation. No evidence of mitral stenosis.  8. Mechanical mitral valve is functioning normally. No abscess. No rocking.  9. The tricuspid valve is normal in structure. Tricuspid valve regurgitation is severe. 10. The aortic valve is tricuspid. Aortic valve regurgitation is mild. Mild to moderate aortic valve sclerosis/calcification without any evidence of aortic stenosis. 11. The pulmonic valve was normal in structure. Pulmonic valve regurgitation is mild. 12. A pacer wire is visualized. 13. The inferior vena cava is normal in size with greater than 50% respiratory variability, suggesting right atrial pressure of 3 mmHg. 14. No vegetations. No endocarditis. FINDINGS  Left Ventricle: Left ventricular ejection fraction, by visual estimation, is 55 to 60%. The left ventricle has normal function. The left ventricle has no regional wall motion abnormalities. There is no left ventricular hypertrophy. Normal left ventricular size. Normal left atrial pressure. Right Ventricle: The right ventricular size is normal. No increase in right ventricular wall thickness. Global RV systolic function is has normal systolic function. Both RA and RV pacer wires are normal. No vegetations. Left Atrium: Left atrial size was moderately dilated. Smoke noted in LA. Right Atrium: Right atrial size was normal in size Pericardium: There is no evidence of pericardial effusion. Mitral Valve: The mitral valve has been repaired/replaced. No evidence of mitral valve stenosis by observation. MV peak gradient, 18.5 mmHg. No evidence of mitral valve regurgitation. Mechanical mitral valve is functioning normally. No abscess. No rocking. Tricuspid Valve: The tricuspid valve is normal in structure. Tricuspid valve regurgitation is severe.  Aortic Valve: The aortic valve is tricuspid. Aortic valve regurgitation is mild. Mild to moderate aortic valve sclerosis/calcification is present, without any evidence of aortic stenosis. Pulmonic Valve: The pulmonic valve was normal in structure. Pulmonic valve regurgitation is mild. Aorta: The aortic  root, ascending aorta and aortic arch are all structurally normal, with no evidence of dilitation or obstruction. Venous: The inferior vena cava is normal in size with greater than 50% respiratory variability, suggesting right atrial pressure of 3 mmHg. Shunts: There is no evidence of a patent foramen ovale. No ventricular septal defect is seen or detected. There is no evidence of an atrial septal defect. No atrial level shunt detected by color flow Doppler. Additional Comments: No vegetations. No endocarditis. A pacer wire is visualized.  MITRAL VALVE             Normals MV Peak grad: 18.5 mmHg  4 mmHg MV Mean grad: 7.0 mmHg MV Vmax:      2.15 m/s MV Vmean:     125.0 cm/s MV VTI:       0.43 m  Candee Furbish MD Electronically signed by Candee Furbish MD Signature Date/Time: 03/21/2019/2:26:09 PM    Final    [name] EKG SITE RITE  Result Date: 03/22/2019 If Site Rite image not attached, placement could not be confirmed due to current cardiac rhythm.   Assessment and Plan:   AMRON GUERRETTE is a 73 y.o. male with a hx of hypertrophic cardiomyopathy status post myectomy with mechanical mitral valve replacement and aortic valve repair, complete heart block s/p CRT device 05/2017 with pocket infection with explant 06/2017, implant BiV pacemaker 07/2017 who is being seen today for the evaluation of CHF at the request of Dr. Lonny Prude.  1.  Acute on chronic diastolic heart failure: History of the same, is managed with torsemide 60 mg twice daily prior to admission.  Diuretic held on admission secondary to worsening renal function.  This has slowly improved.  Has been on Lasix 40 mg twice daily, but weight trending up.  Difficult  to determine volume status given body habitus.  He is actually below his last office weight of 349 pounds on 11/2018. --Lasix has been increased to 60 mg twice daily.  Monitor output and renal function with increased --Continue I's and O's and daily weights.  He reports struggling to use a urinal, and is hesitant about having a Foley catheter placed but will consider. --Continue to hold home metolazone at this time pending response to Lasix increase.  2.  Bacteremia/right lower extremity cellulitis: Underwent TEE which was negative for growth on valve prosthesis.  ID following with plans for PICC line via IR and outpatient antibiotics.  3.  AKI on CKD stage III: Initial creatinine peaked at 3.65 now improved to 1.6.  Lasix adjusted as above.  Home ARB remains on hold.  4.  Mechanical mitral valve/aortic valve repair: As above TEE negative.  Has been resumed on Coumadin per pharmacy.  INR 3.1 today  5.  Rectal bleeding: Recent colonoscopy with diagnosis of hemorrhoids. Did drop to 7.5, and received 1 unit PRBC.  Now stable around 8.5.  -- Follow CBC with need for Coumadin  For questions or updates, please contact [place] Please consult [email or web address] for contact info under   Signed, Reino Bellis, NP  03/24/2019 3:17 PM  Patient examined chart reviewed discussed care with patient, spouse and PA. Patient has significant chronic RLE lymphatic and venous disease with chronic right sided heart failure likely from having so many leads placed and removed across his TV. Diuretics held earlier in hospitalization due to azotemia Being Rx for bad cellulitis TEE 3 days ago by Dr Marlou Porch reviewed and had significant TR With RV dilatation normal 73 yo St Jude bileaflet  MVR with closing volume MR and repaired AV ( damaged during ablation attempt) with mild AS. No SBE. He is currently not getting OOB He does not want foley which was suggested. Will continue iv bid lasix and add back zaroxyln 5 mg daily  and follow diuresis and BMET. If he fails aggressive diuresis or gets azotemia again would consult Dr Jeffie Pollock for potential milrinone to improve RV function Exam with morbidly obese white male. Pacer under left clavicle S1 click with SEM from AV area Plus 4 RLE edema chronic ichthyosis and improving cellulitis with large bullous lesions. LLE looks ok with chronic stasis changes   Jenkins Rouge

## 2019-03-24 NOTE — Progress Notes (Signed)
[facility]  PROGRESS NOTE  Frank [name] YQI:[id number] DOB: 1946/01/30 DOA: 03/16/2019 PCP: [name], MD Admit date - 03/16/2019   Admitting Physician [name], MD  Outpatient Primary MD for the patient is [name], MD  LOS - 7 Brief Narrative   Frank [name] is a 73 y.o. 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  In the ED found to be febrile to 103, tachypneic, hypoxic to 86% on room air requiring 2 L, WBC 29, lactic acid 2.8, creatinine 2.5, fecal occult positive, Covid test negative.  UA remarkable for large leukocytes, greater than 50 WBCs, rare bacteria Chest x-ray showed cardiomegaly with mild volume overload. Patient was admitted with working diagnosis of sepsis presumed secondary to UTI  Hospital course complicated by group G strep bacteremia from severe RLE cellulitis with evolving skin blisters but remained stable on penicillin G as recommended by ID.  Patient underwent TTE and TEE which were negative for prosthetic valve endocarditis.  Blood cultures are remain negative.  ID has arranged outpatient antibiotic therapy,   Subjective  Frank [name] today reports exquisite pain in right calf and ankle with development of new blisters overnight with some clear drainage  A & P     Group G strep bacteremia and RLE nonpurulent cellulitis, stable Has evolving bullae with serous drainage in setting of severe cellulitis and acute on chronic right leg edema.  Clinically improving, remains afebrile, leukocytosis improving, less tender.    -Repeat blood cultures are  negative  --ID agrees continuing current abx as expect skin changes with the severity of his cellulitis  --elevated leg to improve dependent edema, IV lasix for volume overload  --closely monitor erythema, skin changes with skin marker  -Continue IV  penicillin G continuous x 14 days ( end date 04/01/19) per ID recommendations (2 g IV ceftriaxone is an option if unable to tolerate)  --Plan for PICC by IR  -Case management arranging home therapy for antibiotic infusion/outpatient antibiotic orders pended by pharmacy  Acute on chronic Combined systolic/diastolic CHF.    CXR with mild volume overload, currently on room air,  has significant B/l edema, R>L, up 2 lbs on IV lasix 40 mg BID, takes torsemide 60 mg BID at home in addition to metolazone. -3 L during hospital stay  -Increase to IV Lasix 60 mg twice daily, monitor output and blood pressure, (home regimen torsemide 60 mg twice daily)  --Consult Cards given need better diuresis for healing of RLE cellulitis  -Monitor ins and outs, daily weights,   -Holding off on home metolazone/spironolactone, due to previous hypotension--better now   Sepsis, resolved. Likely related to group G strep bacteremia.  Initially admitted for possible UTI given large leuks but asymptomatic and no bacteria.  Currently afebrile,White count downtrending, BP better.   -Continue IV penicillin G x 14 days as mentioned above  Nonoliguric AKI on CKD stage III with hyperkalemia, solved. Likely related to hypotension related to sepsis/bacteremia.  Peak creatinine of 3.65, now back to baseline 1.2-1.6.  UA unremarkable still making good urine, potassium wnl after lokelma  -Monitor urine output, follow BMP, avoid nephrotoxins  -Hold home losartan  Acute hypoxic respiratory failure, resolved.  Likely resultant of acute exacerbation of CHF in  the setting of infection/bacteremia.  Improving as patient is currently room air down from 2 L  -Now tolerating IV Lasix and  maintaining normal blood pressure  - closely monitor weights, output   Hypotension, resolved.  Likely related to sepsis/bacteremia. Had worsening drop with IV diuretics earlier in hospitalization         Mechanical mitral valve/aortic valve.  Supratherapeutic on admission requiring vitamin K,             Currently therapeutic  -coumadin per pharmacy protocol, daily INR goal (2.5-3.5)         Anemia secondary to iron deficiency.  Goal hemoglobin greater than 8 given significant  cardiac history  -Hold off on further iron supplementation in setting of concurrent infection         Paroxysmal atrial fibrillation, complete heart block status post pacemaker.  Currently                rate controlled in NSR.  -Followed by Dr. [name] as outpatient continue to closely monitor  --back on home coumadin        Chronic right lower leg edema.  Venous duplex negative for DVT  -Continue to monitor        Rectal bleeding.  Likely due to known diagnosis of hemorrhoids in the setting of supratherapeutic INR.  Currently stable, hemoglobin has slowly down trended.  -Has not had any further episodes during hospital stay, continue to monitor        Hypervolemic hyponatremia, mild.  In setting of acute seizure exacerbation with IVs 5 overload on exam, expect improvement with IV Lasix  -Monitor BMP      Family Communication  : updated wife at bedside on 12/13 Code Status : Full code  Disposition Plan  : Continue IV penicillin G, needs PICC, continued IV diuresis Consults  : Cardiology, ID  Procedures  : TTE (12/9), TEE 12/11  DVT Prophylaxis  : coumadin Lab Results  Component Value Date   PLT 282 03/24/2019    Diet :  Diet Order            Diet regular Room service appropriate? Yes; Fluid consistency: Thin  Diet effective now               Inpatient Medications Scheduled Meds: . furosemide  40 mg Intravenous BID  . psyllium  1 packet Oral Daily  . warfarin  10 mg Oral ONCE-1800  .  Warfarin - Pharmacist Dosing Inpatient   Does not apply q1800   Continuous Infusions: . penicillin g continuous IV infusion 12 Million Units (03/24/19 1214)   PRN Meds:.acetaminophen, HYDROcodone-acetaminophen, HYDROmorphone (DILAUDID) injection, traMADol, witch hazel-glycerin  Antibiotics  :   Anti-infectives (From admission, onward)   Start     Dose/Rate Route Frequency Ordered Stop   03/24/19 1200  penicillin G potassium 12 Million Units in dextrose 5 % 500 mL continuous infusion     12 Million Units 41.7 mL/hr over 12 Hours Intravenous Every 12 hours 03/24/19 0927     03/20/19 1200  penicillin G potassium 8 Million Units in dextrose 5 % 500 mL continuous infusion  Status:  Discontinued     8 Million Units 41.7 mL/hr over 12 Hours Intravenous Every 12 hours 03/20/19 0945 03/24/19 0927   03/17/19 2200  cefTRIAXone (ROCEPHIN) 1 g in sodium chloride 0.9 % 100 mL IVPB  Status:  Discontinued  1 g 200 mL/hr over 30 Minutes Intravenous Every 24 hours 03/17/19 0240 03/17/19 1536   03/17/19 2200  cefTRIAXone (ROCEPHIN) 2 g in sodium chloride 0.9 % 100 mL IVPB  Status:  Discontinued     2 g 200 mL/hr over 30 Minutes Intravenous Every 24 hours 03/17/19 1536 03/20/19 0945   03/17/19 0130  cefTRIAXone (ROCEPHIN) 1 g in sodium chloride 0.9 % 100 mL IVPB     1 g 200 mL/hr over 30 Minutes Intravenous  Once 03/17/19 0123 03/17/19 0214       Objective   Vitals:   03/24/19 0350 03/24/19 0756 03/24/19 0800 03/24/19 1100  BP: (!) 115/56 (!) 124/53 (!) 124/53 (!) 139/58  Pulse:  65  63  Resp: 13 19  20   Temp: (!) 97.4 F (36.3 C) 98.9 F (37.2 C)  98.6 F (37 C)  TempSrc: Oral Oral  Oral  SpO2: 98% 96%  100%  Weight:      Height:        SpO2: 100 % O2 Flow Rate (L/min): 2 L/min  Wt Readings from Last 3 Encounters:  03/23/19 (!) 156 kg  02/28/19 (!) 150.1 kg  02/14/19 (!) 156 kg     Intake/Output Summary (Last 24 hours) at 03/24/2019 1423 Last data filed at 03/24/2019  1207 Gross per 24 hour  Intake 990 ml  Output 800 ml  Net 190 ml    Physical Exam:  Awake Alert, Oriented X 3, Normal affect No new F.N deficits,  [place].AT, No JVD appreciated Symmetrical Chest wall movement, Good air movement bilaterally, CTAB on room air RRR,systolic click and murmur heard +ve B.Sounds, Abd Soft, No tenderness, No organomegaly appreciated, No rebound, guarding or rigidity. Right leg 2+ edema to above thigh, circumferential redness and warmth starting to retracting from outlined skin marking, not as tender on palpation, , multiple bullae with serosanguinous drainage,  left leg 1 + edema to above thigh.   Right posterior calf 03/24/19                     I have personally reviewed the following:   Data Reviewed:  CBC Recent Labs  Lab 03/20/19 0430 03/21/19 0250 03/22/19 0236 03/23/19 0228 03/24/19 0326  WBC 15.9* 12.6* 11.1* 9.4 9.5  HGB 8.5* 8.3* 9.0* 8.2* 8.6*  HCT 26.0* 25.2* 27.5* 25.8* 27.2*  PLT 216 207 230 245 282  MCV 98.1 96.6 97.5 97.0 98.6  MCH 32.1 31.8 31.9 30.8 31.2  MCHC 32.7 32.9 32.7 31.8 31.6  RDW 17.6* 17.2* 17.2* 17.1* 17.1*    Chemistries  Recent Labs  Lab 03/20/19 0430 03/20/19 1140 03/21/19 0250 03/22/19 0236 03/23/19 0228 03/24/19 0326  NA 130*  --  132* 134* 130* 133*  K 4.8 5.4* 3.8 3.6 3.5 4.0  CL 93*  --  95* 94* 90* 94*  CO2 23  --  23 25 27 27   GLUCOSE 87  --  91 111* 106* 112*  BUN 90*  --  87* 81* 77* 69*  CREATININE 2.83*  --  2.33* 2.00* 1.88* 1.62*  CALCIUM 8.7*  --  8.8* 8.8* 8.5* 8.8*   ------------------------------------------------------------------------------------------------------------------ No results for input(s): CHOL, HDL, LDLCALC, TRIG, CHOLHDL, LDLDIRECT in the last 72 hours.  Lab Results  Component Value Date   HGBA1C 5.7 (H) 09/18/2017   ------------------------------------------------------------------------------------------------------------------ No results for  input(s): TSH, T4TOTAL, T3FREE, THYROIDAB in the last 72 hours.  Invalid input(s): FREET3 ------------------------------------------------------------------------------------------------------------------ No results for input(s): VITAMINB12, FOLATE, FERRITIN, TIBC,  IRON, RETICCTPCT in the last 72 hours.  Coagulation profile Recent Labs  Lab 03/20/19 0430 03/21/19 0250 03/22/19 0236 03/23/19 0228 03/24/19 0326  INR 2.2* 2.3* 2.6* 3.4* 3.1*    No results for input(s): DDIMER in the last 72 hours.  Cardiac Enzymes No results for input(s): CKMB, TROPONINI, MYOGLOBIN in the last 168 hours.  Invalid input(s): CK ------------------------------------------------------------------------------------------------------------------    Component Value Date/Time   BNP 105.7 (H) 03/16/2019 2359   BNP 31.3 07/28/2015 0829    Micro Results Recent Results (from the past 240 hour(s))  Blood culture (routine x 2)     Status: Abnormal   Collection Time: 03/16/19 11:59 PM   Specimen: BLOOD  Result Value Ref Range Status   Specimen Description BLOOD RIGHT HAND  Final   Special Requests   Final    BOTTLES DRAWN AEROBIC AND ANAEROBIC Blood Culture results may not be optimal due to an inadequate volume of blood received in culture bottles   Culture  Setup Time   Final    GRAM POSITIVE COCCI IN CHAINS AEROBIC BOTTLE ONLY CRITICAL RESULT CALLED TO, READ BACK BY AND VERIFIED WITH: E. SINCLAIR, PHARMD AT 1497 ON 03/17/19 BY C. JESSUP, MT.    Culture (A)  Final    STREPTOCOCCUS GROUP G SUSCEPTIBILITIES PERFORMED ON PREVIOUS CULTURE WITHIN THE LAST 5 DAYS. Performed at [facility], [place] [address], [place], [place] [address]    Report Status 03/19/2019 FINAL  Final  Blood culture (routine x 2)     Status: Abnormal   Collection Time: 03/16/19 11:59 PM   Specimen: BLOOD  Result Value Ref Range Status   Specimen Description BLOOD LEFT HAND  Final   Special Requests   Final    BOTTLES DRAWN  AEROBIC AND ANAEROBIC Blood Culture adequate volume   Culture  Setup Time   Final    GRAM POSITIVE COCCI IN CHAINS IN BOTH AEROBIC AND ANAEROBIC BOTTLES CRITICAL RESULT CALLED TO, READ BACK BY AND VERIFIED WITHBronwen Betters [facility] 8588 03/17/19 A BROWNING Performed at [facility], [place] [address] [place], [place] [address]    Culture STREPTOCOCCUS GROUP G (A)  Final   Report Status 03/19/2019 FINAL  Final   Organism ID, Bacteria STREPTOCOCCUS GROUP G  Final      Susceptibility   Streptococcus group g - MIC*    CLINDAMYCIN <=0.25 SENSITIVE Sensitive     AMPICILLIN <=0.25 SENSITIVE Sensitive     ERYTHROMYCIN <=0.12 SENSITIVE Sensitive     VANCOMYCIN 0.5 SENSITIVE Sensitive     CEFTRIAXONE <=0.12 SENSITIVE Sensitive     LEVOFLOXACIN 0.5 SENSITIVE Sensitive     PENICILLIN Value in next row Sensitive      SENSITIVE<=0.06    * STREPTOCOCCUS GROUP G  Blood Culture ID Panel (Reflexed)     Status: Abnormal   Collection Time: 03/16/19 11:59 PM  Result Value Ref Range Status   Enterococcus species NOT DETECTED NOT DETECTED Final   Listeria monocytogenes NOT DETECTED NOT DETECTED Final   Staphylococcus species NOT DETECTED NOT DETECTED Final   Staphylococcus aureus (BCID) NOT DETECTED NOT DETECTED Final   Streptococcus species DETECTED (A) NOT DETECTED Final    Comment: Not Enterococcus species, Streptococcus agalactiae, Streptococcus pyogenes, or Streptococcus pneumoniae. CRITICAL RESULT CALLED TO, READ BACK BY AND VERIFIED WITH: L CURRAN PHARMD [id number] 03/17/19 A BROWNING    Streptococcus agalactiae NOT DETECTED NOT DETECTED Final   Streptococcus pneumoniae NOT DETECTED NOT DETECTED Final   Streptococcus pyogenes NOT  DETECTED NOT DETECTED Final   Acinetobacter baumannii NOT DETECTED NOT DETECTED Final   Enterobacteriaceae species NOT DETECTED NOT DETECTED Final   Enterobacter cloacae complex NOT DETECTED NOT DETECTED Final   Escherichia coli NOT DETECTED NOT DETECTED Final   Klebsiella  oxytoca NOT DETECTED NOT DETECTED Final   Klebsiella pneumoniae NOT DETECTED NOT DETECTED Final   Proteus species NOT DETECTED NOT DETECTED Final   Serratia marcescens NOT DETECTED NOT DETECTED Final   Haemophilus influenzae NOT DETECTED NOT DETECTED Final   Neisseria meningitidis NOT DETECTED NOT DETECTED Final   Pseudomonas aeruginosa NOT DETECTED NOT DETECTED Final   Candida albicans NOT DETECTED NOT DETECTED Final   Candida glabrata NOT DETECTED NOT DETECTED Final   Candida krusei NOT DETECTED NOT DETECTED Final   Candida parapsilosis NOT DETECTED NOT DETECTED Final   Candida tropicalis NOT DETECTED NOT DETECTED Final    Comment: Performed at [facility], [place] [address] [place], [place] [address]  SARS CORONAVIRUS 2 (TAT 6-24 HRS) Nasopharyngeal Nasopharyngeal Swab     Status: None   Collection Time: 03/17/19 12:32 AM   Specimen: Nasopharyngeal Swab  Result Value Ref Range Status   SARS Coronavirus 2 NEGATIVE NEGATIVE Final    Comment: (NOTE) SARS-CoV-2 target nucleic acids are NOT DETECTED. The SARS-CoV-2 RNA is generally detectable in upper and lower respiratory specimens during the acute phase of infection. Negative results do not preclude SARS-CoV-2 infection, do not rule out co-infections with other pathogens, and should not be used as the sole basis for treatment or other patient management decisions. Negative results must be combined with clinical observations, patient history, and epidemiological information. The expected result is Negative. Fact Sheet for Patients: SugarRoll.be Fact Sheet for Healthcare Providers: https://www.woods-mathews.com/ This test is not yet approved or cleared by the [place] FDA and  has been authorized for detection and/or diagnosis of SARS-CoV-2 by FDA under an Emergency Use Authorization (EUA). This EUA will remain  in effect (meaning this test can be used) for the duration of  the COVID-19 declaration under Section 56 4(b)(1) of the Act, 21 U.S.C. section 360bbb-3(b)(1), unless the authorization is terminated or revoked sooner. Performed at [facility], [place] [address], [place], [place] [address]   Urine culture     Status: Abnormal   Collection Time: 03/17/19 12:54 AM   Specimen: Urine, Random  Result Value Ref Range Status   Specimen Description URINE, RANDOM  Final   Special Requests   Final    NONE Performed at [facility], [place] [address], [place], [place] [address]    Culture MULTIPLE SPECIES PRESENT, SUGGEST RECOLLECTION (A)  Final   Report Status 03/18/2019 FINAL  Final  Culture, blood (Routine X 2) w Reflex to ID Panel     Status: None   Collection Time: 03/19/19  4:02 PM   Specimen: BLOOD RIGHT HAND  Result Value Ref Range Status   Specimen Description BLOOD RIGHT HAND  Final   Special Requests   Final    BOTTLES DRAWN AEROBIC AND ANAEROBIC Blood Culture results may not be optimal due to an inadequate volume of blood received in culture bottles   Culture   Final    NO GROWTH 5 DAYS Performed at [facility], [place] [address] [place], [place] [address]    Report Status 03/24/2019 FINAL  Final  Culture, blood (Routine X 2) w Reflex to ID Panel     Status: None   Collection Time: 03/19/19  4:10 PM   Specimen: BLOOD RIGHT HAND  Result Value Ref Range Status   Specimen Description BLOOD RIGHT HAND  Final   Special Requests   Final    BOTTLES DRAWN AEROBIC AND ANAEROBIC Blood Culture results may not be optimal due to an inadequate volume of blood received in culture bottles   Culture   Final    NO GROWTH 5 DAYS Performed at [facility], [place] [address] [place], [place] [address]    Report Status 03/24/2019 FINAL  Final    Radiology Reports DG Chest Port 1 View  Result Date: 03/16/2019 CLINICAL DATA:  Shortness of breath EXAM: PORTABLE CHEST 1 VIEW COMPARISON:  July 10, 2018 FINDINGS: The heart size is significantly  enlarged. The patient is status post prior median sternotomy. Aortic calcifications are noted. There is a multi lead right-sided pacemaker in place. There is vascular congestion without overt pulmonary edema. There is no pneumothorax or large pleural effusion. There is no acute osseous abnormality. IMPRESSION: Cardiomegaly with mild volume overload. Electronically Signed   By: Constance Holster M.D.   On: 03/16/2019 23:50   DG Ankle Right Port  Result Date: 03/20/2019 CLINICAL DATA:  Right ankle pain., Redness and swelling. EXAM: PORTABLE RIGHT ANKLE - 2 VIEW COMPARISON:  None. FINDINGS: There is no acute bony or joint abnormality. No soft tissue gas or radiopaque foreign body. Soft tissues are diffusely swollen. Atherosclerosis noted. No radiopaque foreign body. IMPRESSION: Diffuse soft tissue swelling about the ankle could be due to dependent change or cellulitis. The exam is otherwise negative. Electronically Signed   By: Inge Rise M.D.   On: 03/20/2019 14:51   ECHOCARDIOGRAM COMPLETE  Result Date: 03/19/2019   ECHOCARDIOGRAM REPORT   Patient Name:   ECTOR LAUREL Mcculley Date of Exam: 03/19/2019 Medical Rec #:  [id number]       Height:       71.0 in Accession #:    [id number]      Weight:       345.7 lb Date of Birth:  03-26-1946      BSA:          2.66 m Patient Age:    39 years        BP:           99/43 mmHg Patient Gender: M               HR:           60 bpm. Exam Location:  Inpatient Procedure: 2D Echo, Cardiac Doppler, Color Doppler and Intracardiac            Opacification Agent                            MODIFIED REPORT: This report was modified by Eleonore Chiquito MD on 03/19/2019 due to error.  Indications:     Bacteremia.  History:         Patient has prior history of Echocardiogram examinations, most                  recent 02/14/2019. Non- ischemic cardiomyopathy and CHF,                  Pacemaker and Abnormal ECG, TIA and Pulmonary HTN, Mitral Valve                  Disease,  Arrythmias:Complete heart block; Risk  Factors:Hypertension. Mitral Valve: mechanical valve is present                  in the mitral position.  Sonographer:     Roseanna Rainbow RDCS Referring Phys:  [id number] [name] Diagnosing Phys: Eleonore Chiquito MD  Sonographer Comments: Technically difficult study due to poor echo windows, Technically challenging study due to limited acoustic windows, patient is morbidly obese, suboptimal subcostal window, suboptimal apical window and suboptimal parasternal window.  Image acquisition challenging due to patient body habitus. IMPRESSIONS  1. No evidence of infective endocarditis on this study. A TEE is recommended to exclude endocarditis.  2. The patient is s/p myomectomy with mechanical MVR and AoV repair (reported in record). Overall, the EF remains unchanged with prior. The mechanical MV prosthesis is functioning normally. The AoV is severely calcified with restricted movement. I suspect there is at least mild to moderate aortic stenosis, but doppler interrogation on this study is suboptimal. The tricuspid regurgitation on this study is severe with systolic hepatic vein flow reversal (not well evaluated on last study). Findings compared directly with study from 02/14/2019.  3. Left ventricular ejection fraction, by visual estimation, is 55 to 60%. The left ventricle has normal function. There is mildly increased left ventricular hypertrophy.  4. Definity contrast agent was given IV to delineate the left ventricular endocardial borders.  5. Left ventricular diastolic function could not be evaluated.  6. Right ventricular volume overload.  7. The left ventricle has no regional wall motion abnormalities.  8. Global right ventricle has severely reduced systolic function.The right ventricular size is severely enlarged. No increase in right ventricular wall thickness.  9. Left atrial size was mild-moderately dilated. 10. Right atrial size was severely dilated. 11. The  mitral valve has been repaired/replaced. No evidence of mitral valve regurgitation. 12. Mechanical mitral valve present. Mean gradient 5 mmHG with heart rate 60 bpm. EOA 5 mmHG. Normal functioning prosthetic valve with stable gradients compared with prior. 13. The tricuspid valve is grossly normal. Tricuspid valve regurgitation is severe. 14. Aortic valve regurgitation is mild. Mild to moderate aortic valve stenosis. 15. The aortic valve is severely calcified with restricted leaflet motion. Doppler interrogation is poor on this study. Vmax 2.5 m/s, Mean gradient ~8.5 mmHG, but suspect this is severely underestimated. There is likely a mild to moderate degree of aortic stenosis present. 16. The pulmonic valve was grossly normal. Pulmonic valve regurgitation is not visualized. 17. Mild plaque invoving the aortic root. 18. Normal pulmonary artery systolic pressure. 19. The tricuspid regurgitant velocity is 1.76 m/s, and with an assumed right atrial pressure of 15 mmHg, the estimated right ventricular systolic pressure is normal at 27.4 mmHg. 20. A pacer wire is visualized in the RA and RV. FINDINGS  Left Ventricle: Left ventricular ejection fraction, by visual estimation, is 55 to 60%. The left ventricle has normal function. Definity contrast agent was given IV to delineate the left ventricular endocardial borders. The left ventricle has no regional wall motion abnormalities. There is mildly increased left ventricular hypertrophy. Concentric left ventricular hypertrophy. The interventricular septum is flattened in diastole ('D' shaped left ventricle), consistent with right ventricular volume overload. The left ventricular diastology could not be evaluated due to mitral valve replacement/repair. Left ventricular diastolic function could not be evaluated. Right Ventricle: The right ventricular size is severely enlarged. No increase in right ventricular wall thickness. Global RV systolic function is has severely reduced  systolic function. The tricuspid regurgitant velocity is 1.76 m/s, and with  an assumed right atrial pressure of 15 mmHg, the estimated right ventricular systolic pressure is normal at 27.4 mmHg. Left Atrium: Left atrial size was mild-moderately dilated. Right Atrium: Right atrial size was severely dilated Pericardium: There is no evidence of pericardial effusion. Mitral Valve: The mitral valve has been repaired/replaced. No evidence of mitral valve regurgitation. MV peak gradient, 19.1 mmHg. Mechanical mitral valve present. Mean gradient 5 mmHG with heart rate 60 bpm. EOA 5 mmHG. Normal functioning prosthetic valve with stable gradients compared with prior. Tricuspid Valve: The tricuspid valve is grossly normal. Tricuspid valve regurgitation is severe. The flow in the hepatic veins is reversed during ventricular systole. Aortic Valve: The aortic valve has been repaired/replaced. Aortic valve regurgitation is mild. Mild to moderate aortic stenosis is present. Aortic valve mean gradient measures 8.5 mmHg. Aortic valve peak gradient measures 16.2 mmHg. Aortic valve area, by  VTI measures 2.40 cm. The aortic valve is severely calcified with restricted leaflet motion. Doppler interrogation is poor on this study. Vmax 2.5 m/s, Mean gradient ~8.5 mmHG, but suspect this is severely underestimated. There is likely a mild to moderate degree of aortic stenosis present. Pulmonic Valve: The pulmonic valve was grossly normal. Pulmonic valve regurgitation is not visualized. Pulmonic regurgitation is not visualized. Aorta: The aortic root is normal in size and structure. There is mild, layered plaque involving the aortic root. IAS/Shunts: No atrial level shunt detected by color flow Doppler. Additional Comments: A pacer wire is visualized in the right atrium and right ventricle.  LEFT VENTRICLE PLAX 2D LVIDd:         4.10 cm       Diastology LVIDs:         3.00 cm       LV e' lateral:   11.70 cm/s LV PW:         2.10 cm       LV  E/e' lateral: 12.3 LV IVS:        1.70 cm       LV e' medial:    8.59 cm/s LVOT diam:     2.20 cm       LV E/e' medial:  16.8 LV SV:         39 ml LV SV Index:   13.60 LVOT Area:     3.80 cm  LV Volumes (MOD) LV area d, A2C:    33.90 cm LV area d, A4C:    38.70 cm LV area s, A2C:    23.20 cm LV area s, A4C:    25.93 cm LV major d, A2C:   7.96 cm LV major d, A4C:   8.64 cm LV major s, A2C:   7.74 cm LV major s, A4C:   8.04 cm LV vol d, MOD A2C: 120.0 ml LV vol d, MOD A4C: 147.4 ml LV vol s, MOD A2C: 58.2 ml LV vol s, MOD A4C: 71.6 ml LV SV MOD A2C:     61.8 ml LV SV MOD A4C:     147.4 ml LV SV MOD BP:      71.1 ml RIGHT VENTRICLE            IVC RV S prime:     8.81 cm/s  IVC diam: 4.70 cm TAPSE (M-mode): 1.5 cm LEFT ATRIUM            Index       RIGHT ATRIUM           Index LA diam:  6.40 cm  2.41 cm/m  RA Area:     42.00 cm LA Vol (A2C): 108.0 ml 40.59 ml/m RA Volume:   177.00 ml 66.52 ml/m LA Vol (A4C): 147.0 ml 55.25 ml/m  AORTIC VALVE AV Area (Vmax):    2.51 cm AV Area (Vmean):   2.58 cm AV Area (VTI):     2.40 cm AV Vmax:           201.50 cm/s AV Vmean:          132.000 cm/s AV VTI:            0.419 m AV Peak Grad:      16.2 mmHg AV Mean Grad:      8.5 mmHg LVOT Vmax:         133.00 cm/s LVOT Vmean:        89.600 cm/s LVOT VTI:          0.264 m LVOT/AV VTI ratio: 0.63  AORTA Ao Root diam: 3.70 cm Ao Asc diam:  3.40 cm MITRAL VALVE                        TRICUSPID VALVE MV Area (PHT): 2.48 cm             TV Peak grad:   17.1 mmHg MV Peak grad:  19.1 mmHg            TV Mean grad:   5.0 mmHg MV Mean grad:  5.0 mmHg             TV Vmax:        2.07 m/s MV Vmax:       2.18 m/s             TV Vmean:       93.2 cm/s MV Vmean:      93.5 cm/s            TV VTI:         0.61 msec MV VTI:        0.54 m               TR Peak grad:   12.4 mmHg MV PHT:        88.74 msec           TR Vmax:        178.00 cm/s MV Decel Time: 306 msec MV E velocity: 144.00 cm/s 103 cm/s SHUNTS                                      Systemic VTI:  0.26 m                                     Systemic Diam: 2.20 cm  Eleonore Chiquito MD Electronically signed by Eleonore Chiquito MD Signature Date/Time: 03/19/2019/11:50:41 AM    Final (Updated)    ECHO TEE  Result Date: 03/21/2019   TRANSESOPHOGEAL ECHO REPORT   Patient Name:   ADANTE COURINGTON [place] Date of Exam: 03/21/2019 Medical Rec #:  [id number]       Height:       71.0 in Accession #:    [id number]      Weight:       341.5 lb Date of Birth:  September 24, 1945  BSA:          2.65 m Patient Age:    26 years        BP:           111/45 mmHg Patient Gender: M               HR:           62 bpm. Exam Location:  Inpatient  Procedure: Transesophageal Echo Indications:    bacteremia  History:        Patient has prior history of Echocardiogram examinations, most                 recent 03/19/2019. 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  Sonographer:    Jannett Celestine RDCS (AE) Referring Phys: [place]: Consent was requested emergently by emergency room physicain. The transesophogeal probe was passed through the esophogus of the patient. The patient's vital signs; including heart rate, blood pressure, and oxygen saturation; remained stable throughout the procedure. The patient developed no complications during the procedure. IMPRESSIONS  1. Left ventricular ejection fraction, by visual estimation, is 55 to 60%. The left ventricle has normal function. Normal left ventricular size. There is no left ventricular hypertrophy.  2. The left ventricle has no regional wall motion abnormalities.  3. Global right ventricle has normal systolic function.The right ventricular size is normal. No increase in right ventricular wall thickness.  4. Both RA and RV pacer wires are normal. No vegetations.  5. Left atrial size was moderately dilated.  6. Right atrial size was  normal.  7. The mitral valve has been repaired/replaced. No evidence of mitral valve regurgitation. No evidence of mitral stenosis.  8. Mechanical mitral valve is functioning normally. No abscess. No rocking.  9. The tricuspid valve is normal in structure. Tricuspid valve regurgitation is severe. 10. The aortic valve is tricuspid. Aortic valve regurgitation is mild. Mild to moderate aortic valve sclerosis/calcification without any evidence of aortic stenosis. 11. The pulmonic valve was normal in structure. Pulmonic valve regurgitation is mild. 12. A pacer wire is visualized. 13. The inferior vena cava is normal in size with greater than 50% respiratory variability, suggesting right atrial pressure of 3 mmHg. 14. No vegetations. No endocarditis. FINDINGS  Left Ventricle: Left ventricular ejection fraction, by visual estimation, is 55 to 60%. The left ventricle has normal function. The left ventricle has no regional wall motion abnormalities. There is no left ventricular hypertrophy. Normal left ventricular size. Normal left atrial pressure. Right Ventricle: The right ventricular size is normal. No increase in right ventricular wall thickness. Global RV systolic function is has normal systolic function. Both RA and RV pacer wires are normal. No vegetations. Left Atrium: Left atrial size was moderately dilated. Smoke noted in LA. Right Atrium: Right atrial size was normal in size Pericardium: There is no evidence of pericardial effusion. Mitral Valve: The mitral valve has been repaired/replaced. No evidence of mitral valve stenosis by observation. MV peak gradient, 18.5 mmHg. No evidence of mitral valve regurgitation. Mechanical mitral valve is functioning normally. No abscess. No rocking. Tricuspid Valve: The tricuspid valve is normal in structure. Tricuspid valve regurgitation is severe. Aortic Valve: The aortic valve is tricuspid.  Aortic valve regurgitation is mild. Mild to moderate aortic valve  sclerosis/calcification is present, without any evidence of aortic stenosis. Pulmonic Valve: The pulmonic valve was normal in structure. Pulmonic valve regurgitation is mild. Aorta: The aortic root, ascending aorta and aortic arch are all structurally normal, with no evidence of dilitation or obstruction. Venous: The inferior vena cava is normal in size with greater than 50% respiratory variability, suggesting right atrial pressure of 3 mmHg. Shunts: There is no evidence of a patent foramen ovale. No ventricular septal defect is seen or detected. There is no evidence of an atrial septal defect. No atrial level shunt detected by color flow Doppler. Additional Comments: No vegetations. No endocarditis. A pacer wire is visualized.  MITRAL VALVE             Normals MV Peak grad: 18.5 mmHg  4 mmHg MV Mean grad: 7.0 mmHg MV Vmax:      2.15 m/s MV Vmean:     125.0 cm/s MV VTI:       0.43 m  Candee Furbish MD Electronically signed by Candee Furbish MD Signature Date/Time: 03/21/2019/2:26:09 PM    Final    CUP PACEART REMOTE DEVICE CHECK  Result Date: 03/06/2019 Scheduled remote reviewed.  Normal device function.  No atrial arrhythmia detected since last remote on 02/25/2019 Next remote 91 days. Kathy Breach, RN, CCDS, [employer]  VAS [name] LOWER EXTREMITY VENOUS (DVT)  Result Date: 03/17/2019  Lower Venous Study Indications: Swelling, and Edema.  Limitations: Body habitus and poor ultrasound/tissue interface. Comparison Study: no prior Performing Technologist: Abram Sander RVS  Examination Guidelines: A complete evaluation includes B-mode imaging, spectral Doppler, color Doppler, and power Doppler as needed of all accessible portions of each vessel. Bilateral testing is considered an integral part of a complete examination. Limited examinations for reoccurring indications may be performed as noted.  +---------+---------------+---------+-----------+----------+--------------+ RIGHT     CompressibilityPhasicitySpontaneityPropertiesThrombus Aging +---------+---------------+---------+-----------+----------+--------------+ CFV      Full           Yes      Yes                                 +---------+---------------+---------+-----------+----------+--------------+ SFJ      Full                                                        +---------+---------------+---------+-----------+----------+--------------+ FV Prox  Full                                                        +---------+---------------+---------+-----------+----------+--------------+ FV Mid                  Yes      Yes                                 +---------+---------------+---------+-----------+----------+--------------+ FV Distal               Yes      Yes                                 +---------+---------------+---------+-----------+----------+--------------+  PFV      Full                                                        +---------+---------------+---------+-----------+----------+--------------+ POP      Full           Yes      Yes                                 +---------+---------------+---------+-----------+----------+--------------+ PTV                                                   Not visualized +---------+---------------+---------+-----------+----------+--------------+ PERO                                                  Not visualized +---------+---------------+---------+-----------+----------+--------------+   +----+---------------+---------+-----------+----------+--------------+ LEFTCompressibilityPhasicitySpontaneityPropertiesThrombus Aging +----+---------------+---------+-----------+----------+--------------+ CFV                Yes      Yes                                 +----+---------------+---------+-----------+----------+--------------+     Summary: Right: There is no evidence of deep vein thrombosis in the lower  extremity. However, portions of this examination were limited- see technologist comments above. No cystic structure found in the popliteal fossa. Left: No evidence of common femoral vein obstruction.  *See table(s) above for measurements and observations. Electronically signed by Servando Snare MD on 03/17/2019 at 5:03:22 PM.    Final    [name] EKG SITE RITE  Result Date: 03/22/2019 If Site Rite image not attached, placement could not be confirmed due to current cardiac rhythm.    Time Spent in minutes  30     Desiree Hane M.D on 03/24/2019 at 2:23 PM  To page go to [email or web address] - password [facility]

## 2019-03-25 DIAGNOSIS — I50811 Acute right heart failure: Secondary | ICD-10-CM

## 2019-03-25 LAB — BASIC METABOLIC PANEL
Anion gap: 11 (ref 5–15)
BUN: 64 mg/dL — ABNORMAL HIGH (ref 8–23)
CO2: 30 mmol/L (ref 22–32)
Calcium: 8.8 mg/dL — ABNORMAL LOW (ref 8.9–10.3)
Chloride: 92 mmol/L — ABNORMAL LOW (ref 98–111)
Creatinine, Ser: 1.63 mg/dL — ABNORMAL HIGH (ref 0.61–1.24)
GFR calc Af Amer: 49 mL/min — ABNORMAL LOW (ref >60)
GFR calc non Af Amer: 42 mL/min — ABNORMAL LOW (ref >60)
Glucose, Bld: 112 mg/dL — ABNORMAL HIGH (ref 70–99)
Potassium: 3.6 mmol/L (ref 3.5–5.1)
Sodium: 133 mmol/L — ABNORMAL LOW (ref 135–145)

## 2019-03-25 LAB — CBC
HCT: 25.9 % — ABNORMAL LOW (ref 39.0–52.0)
Hemoglobin: 8.5 g/dL — ABNORMAL LOW (ref 13.0–17.0)
MCH: 31.6 pg (ref 26.0–34.0)
MCHC: 32.8 g/dL (ref 30.0–36.0)
MCV: 96.3 fL (ref 80.0–100.0)
Platelets: 293 10*3/uL (ref 150–400)
RBC: 2.69 MIL/uL — ABNORMAL LOW (ref 4.22–5.81)
RDW: 17 % — ABNORMAL HIGH (ref 11.5–15.5)
WBC: 9.4 10*3/uL (ref 4.0–10.5)
nRBC: 0.5 % — ABNORMAL HIGH (ref 0.0–0.2)

## 2019-03-25 LAB — PROTIME-INR
INR: 3.6 — ABNORMAL HIGH (ref 0.8–1.2)
Prothrombin Time: 35.6 seconds — ABNORMAL HIGH (ref 11.4–15.2)

## 2019-03-25 MED ORDER — WARFARIN SODIUM 5 MG PO TABS
5.0000 mg | ORAL_TABLET | Freq: Once | ORAL | Status: AC
  Administered 2019-03-25: 19:00:00 5 mg via ORAL
  Filled 2019-03-25: qty 1

## 2019-03-25 NOTE — TOC Progression Note (Signed)
Transition of Care (TOC) - Progression Note  Marvetta Gibbons RN, BSN Transitions of Care Unit 4E- RN Case Manager [phone]   Patient Details  Name: Frank [name] MRN: [id number] Date of Birth: Oct 17, 1945  Transition of Care [facility] CM/SW Contact  Dahlia Client, Romeo Rabon, RN Phone Number: 03/25/2019, 12:26 PM  Clinical Narrative:    Spoke with pt and wife at bedside to f/u on [facility] agency choice- list at bedside that had been provided per previous CM - per pt and wife they are agreeable to using [facility] for home IV infusion needs- and would like to use [facility] for [facility] services. Per conversation pt has 2 RW at home and all needed DME- wife is looking for shower chair that would fit their shower. Wife is very confident that she can manage wound and IV needs at home. Call made to [facility] with [facility] for HHRN/PT needs- will coordinate care with Pam at [facility] for home IV abx needs- call also made to [facility] - awaiting return call to confirm with Pam transition of care needs.   Expected Discharge Plan: [place] Barriers to Discharge: Continued Medical Work up  Expected Discharge Plan and Services Expected Discharge Plan: [place]   Discharge Planning Services: CM Consult Post Acute Care Choice: Home Health, Durable Medical Equipment Living arrangements for the past 2 months: Single Family Home                           HH Arranged: RN, PT [facility] Agency: [facility] Date [place]: 03/25/19 Time [place]: 0131 Representative spoke with at [place]: [place] ([place]) Interventions    Readmission Risk Interventions No flowsheet data found.

## 2019-03-25 NOTE — Progress Notes (Signed)
[facility]  PROGRESS NOTE  Frank [name] TZG:[id number] DOB: 10-29-1945 DOA: 03/16/2019 PCP: [name], MD Admit date - 03/16/2019   Admitting Physician [name], MD  Outpatient Primary MD for the patient is [name], MD  LOS - 8 Brief Narrative   Frank [name] is a 73 y.o. 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  In the ED found to be febrile to 103, tachypneic, hypoxic to 86% on room air requiring 2 L, WBC 29, lactic acid 2.8, creatinine 2.5, fecal occult positive, Covid test negative.  UA remarkable for large leukocytes, greater than 50 WBCs, rare bacteria Chest x-ray showed cardiomegaly with mild volume overload. Patient was admitted with working diagnosis of sepsis presumed secondary to UTI  Hospital course complicated by group G strep bacteremia from severe RLE cellulitis with evolving skin blisters but remained stable on penicillin G as recommended by ID.  Patient underwent TTE and TEE which were negative for prosthetic valve endocarditis.  Blood cultures are remain negative.  ID has arranged outpatient antibiotic therapy.  Currently requiring IV lasix diuresis with metolazone for chf exacerbation assisted by cardiology recommendations .   Subjective  Frank [name] today reports improvement in pain control of right ankle. Feels swelling has gone down.   A & P     Group G strep bacteremia and RLE nonpurulent cellulitis, stable Has evolving bullae with serous drainage in setting of severe cellulitis and acute on chronic right leg edema.  Clinically improving,  remains afebrile, leukocytosis improving, less tender.    -Repeat blood cultures are negative  --ID agrees continuing current abx as expect skin changes with the severity of his cellulitis  --elevated leg to improve dependent edema, IV lasix for volume overload  --closely monitor erythema, skin changes with skin marker  -Continue IV  penicillin G continuous x 14 days ( end date 04/01/19) per ID recommendations (2 g IV ceftriaxone is an option if unable to tolerate)  --Plan for long term IV access by IR  -Case management arranging home therapy for antibiotic infusion/outpatient antibiotic orders pended by pharmacy  Acute on chronic Combined systolic/diastolic CHF.    CXR with mild volume overload, currently on room air,  has significant B/l edema, R>L, responding better to IV lasix 60 mg BID with addition of metolazone by cardiology recommendations. Takes torsemide 60 mg BID at home in addition to metolazone.   -Per cardiology continueIV Lasix 60 mg twice daily and metolazone, monitor output and blood pressure, (home regimen torsemide 60 mg twice daily)  -Monitor ins and outs, daily weights,   -Holding off on home spironolactone, due to previous hypotension--better now   Sepsis, resolved. Likely related to group G strep bacteremia.  Initially admitted for possible UTI given large leuks but asymptomatic and no bacteria.  Currently afebrile,White count downtrending, BP better.   -Continue IV penicillin G x 14 days as mentioned above  Nonoliguric AKI on CKD stage III with hyperkalemia, solved. Likely related to hypotension related to sepsis/bacteremia.  Peak creatinine of 3.65, now back to baseline 1.2-1.6.  UA unremarkable still making good urine, potassium wnl after lokelma earlier during stay  -Monitor urine output, follow BMP, avoid nephrotoxins  -  Hold home losartan          Acute hypoxic respiratory failure, resolved.  Likely resultant of acute exacerbation of CHF in  the setting of  infection/bacteremia.  Improving as patient is currently room air down from 2 L    -Now tolerating IV Lasix and maintaining normal blood pressure  - closely monitor weights, output   Hypotension, resolved.  Likely related to sepsis/bacteremia. Had worsening drop with IV diuretics earlier in hospitalization         Mechanical mitral valve/aortic valve.  Supratherapeutic on admission requiring vitamin K, Currently therapeutic  -coumadin per pharmacy protocol, daily INR goal (2.5-3.5)         Anemia secondary to iron deficiency.  Goal hemoglobin greater than 8 given significant cardiac history  -Hold off on further iron supplementation in setting of concurrent infection         Paroxysmal atrial fibrillation, complete heart block status post pacemaker.  Currently rate controlled in NSR.  -Followed by Dr. [name] as outpatient continue to closely monitor  --back on home coumadin        Chronic right lower leg edema.  Venous duplex negative for DVT. Acutely worsened related to right sided cellulitis  -Continue to monitor with IV diuresis        Rectal bleeding.  Likely due to known diagnosis of hemorrhoids in the setting of supratherapeutic INR.  Currently stable, hemoglobin has slowly down trended.  -Has not had any further episodes during hospital stay, continue to monitor        Hypervolemic hyponatremia, mild.  In setting of acute CHF exacerbation   -- expect improvement with IV Lasix  -Monitor BMP      Family Communication  : updated wife on phone on12/15 Code Status : Full code  Disposition Plan  : Continue IV penicillin G, needs long term IV access--by IR (not PICC given CKD but port)  continued IV diuresis per cardiology Consults  : Cardiology, ID  Procedures  : TTE (12/9), TEE 12/11, Venous duplex 12/7  DVT Prophylaxis  : coumadin Lab Results  Component Value Date   PLT 293 03/25/2019    Diet :  Diet Order            Diet Heart Room service appropriate? Yes; Fluid  consistency: Thin; Fluid restriction: 1200 mL Fluid  Diet effective now               Inpatient Medications Scheduled Meds:  furosemide  60 mg Intravenous BID   metolazone  5 mg Oral Daily   psyllium  1 packet Oral Daily   Warfarin - Pharmacist Dosing Inpatient   Does not apply q1800   Continuous Infusions:  penicillin g continuous IV infusion 12 Million Units (03/25/19 1237)   PRN Meds:.acetaminophen, HYDROcodone-acetaminophen, HYDROmorphone (DILAUDID) injection, traMADol, witch hazel-glycerin  Antibiotics  :   Anti-infectives (From admission, onward)   Start     Dose/Rate Route Frequency Ordered Stop   03/24/19 1200  penicillin G potassium 12 Million Units in dextrose 5 % 500 mL continuous infusion     12 Million Units 41.7 mL/hr over 12 Hours Intravenous Every 12 hours 03/24/19 0927     03/20/19 1200  penicillin G potassium 8 Million Units in dextrose 5 % 500 mL continuous infusion  Status:  Discontinued     8 Million Units 41.7 mL/hr over 12 Hours Intravenous Every 12 hours 03/20/19 0945 03/24/19 0927   03/17/19 2200  cefTRIAXone (ROCEPHIN) 1 g in sodium  chloride 0.9 % 100 mL IVPB  Status:  Discontinued     1 g 200 mL/hr over 30 Minutes Intravenous Every 24 hours 03/17/19 0240 03/17/19 1536   03/17/19 2200  cefTRIAXone (ROCEPHIN) 2 g in sodium chloride 0.9 % 100 mL IVPB  Status:  Discontinued     2 g 200 mL/hr over 30 Minutes Intravenous Every 24 hours 03/17/19 1536 03/20/19 0945   03/17/19 0130  cefTRIAXone (ROCEPHIN) 1 g in sodium chloride 0.9 % 100 mL IVPB     1 g 200 mL/hr over 30 Minutes Intravenous  Once 03/17/19 0123 03/17/19 0214       Objective   Vitals:   03/25/19 0413 03/25/19 0823 03/25/19 1237 03/25/19 1636  BP: 112/65 (!) 121/50 (!) 128/57 121/76  Pulse: 67 60 64 83  Resp: 18 12 16 12   Temp: 99 F (37.2 C) 98.8 F (37.1 C) 98.9 F (37.2 C) 98.5 F (36.9 C)  TempSrc: Oral Oral Oral Oral  SpO2: 99% 99% 100% 100%  Weight: (!) 152.4 kg       Height:        SpO2: 100 % O2 Flow Rate (L/min): 2 L/min  Wt Readings from Last 3 Encounters:  03/25/19 (!) 152.4 kg  02/28/19 (!) 150.1 kg  02/14/19 (!) 156 kg     Intake/Output Summary (Last 24 hours) at 03/25/2019 1922 Last data filed at 03/25/2019 1237 Gross per 24 hour  Intake 1240.87 ml  Output 3600 ml  Net -2359.13 ml    Physical Exam:  Awake Alert, Oriented X 3, Normal affect No new F.N deficits,  [place].AT, No JVD appreciated Symmetrical Chest wall movement, Good air movement bilaterally, CTAB on room air RRR,systolic click and murmur heard +ve B.Sounds, Abd Soft, No tenderness, No organomegaly appreciated, No rebound, guarding or rigidity. Right leg 2+ edema to above thigh, circumferential redness and warmth starting to retract from outlined skin marking (as noted below), not as tender on palpation, multiple bullae with serosanguinous drainage (improved from prior exam),  left leg minimal edema  Right posterior calf 03/25/19           I have personally reviewed the following:   Data Reviewed:  CBC Recent Labs  Lab 03/21/19 0250 03/22/19 0236 03/23/19 0228 03/24/19 0326 03/25/19 0230  WBC 12.6* 11.1* 9.4 9.5 9.4  HGB 8.3* 9.0* 8.2* 8.6* 8.5*  HCT 25.2* 27.5* 25.8* 27.2* 25.9*  PLT 207 230 245 282 293  MCV 96.6 97.5 97.0 98.6 96.3  MCH 31.8 31.9 30.8 31.2 31.6  MCHC 32.9 32.7 31.8 31.6 32.8  RDW 17.2* 17.2* 17.1* 17.1* 17.0*    Chemistries  Recent Labs  Lab 03/21/19 0250 03/22/19 0236 03/23/19 0228 03/24/19 0326 03/25/19 0230  NA 132* 134* 130* 133* 133*  K 3.8 3.6 3.5 4.0 3.6  CL 95* 94* 90* 94* 92*  CO2 23 25 27 27 30   GLUCOSE 91 111* 106* 112* 112*  BUN 87* 81* 77* 69* 64*  CREATININE 2.33* 2.00* 1.88* 1.62* 1.63*  CALCIUM 8.8* 8.8* 8.5* 8.8* 8.8*   ------------------------------------------------------------------------------------------------------------------ No results for input(s): CHOL, HDL, LDLCALC, TRIG, CHOLHDL,  LDLDIRECT in the last 72 hours.  Lab Results  Component Value Date   HGBA1C 5.7 (H) 09/18/2017   ------------------------------------------------------------------------------------------------------------------ No results for input(s): TSH, T4TOTAL, T3FREE, THYROIDAB in the last 72 hours.  Invalid input(s): FREET3 ------------------------------------------------------------------------------------------------------------------ No results for input(s): VITAMINB12, FOLATE, FERRITIN, TIBC, IRON, RETICCTPCT in the last 72 hours.  Coagulation profile Recent Labs  Lab 03/21/19 0250  03/22/19 0236 03/23/19 0228 03/24/19 0326 03/25/19 0230  INR 2.3* 2.6* 3.4* 3.1* 3.6*    No results for input(s): DDIMER in the last 72 hours.  Cardiac Enzymes No results for input(s): CKMB, TROPONINI, MYOGLOBIN in the last 168 hours.  Invalid input(s): CK ------------------------------------------------------------------------------------------------------------------    Component Value Date/Time   BNP 105.7 (H) 03/16/2019 2359   BNP 31.3 07/28/2015 0829    Micro Results Recent Results (from the past 240 hour(s))  Blood culture (routine x 2)     Status: Abnormal   Collection Time: 03/16/19 11:59 PM   Specimen: BLOOD  Result Value Ref Range Status   Specimen Description BLOOD RIGHT HAND  Final   Special Requests   Final    BOTTLES DRAWN AEROBIC AND ANAEROBIC Blood Culture results may not be optimal due to an inadequate volume of blood received in culture bottles   Culture  Setup Time   Final    GRAM POSITIVE COCCI IN CHAINS AEROBIC BOTTLE ONLY CRITICAL RESULT CALLED TO, READ BACK BY AND VERIFIED WITH: E. SINCLAIR, PHARMD AT 5809 ON 03/17/19 BY C. JESSUP, MT.    Culture (A)  Final    STREPTOCOCCUS GROUP G SUSCEPTIBILITIES PERFORMED ON PREVIOUS CULTURE WITHIN THE LAST 5 DAYS. Performed at [facility], [place] [address], [place], [place] [address]    Report Status 03/19/2019 FINAL  Final    Blood culture (routine x 2)     Status: Abnormal   Collection Time: 03/16/19 11:59 PM   Specimen: BLOOD  Result Value Ref Range Status   Specimen Description BLOOD LEFT HAND  Final   Special Requests   Final    BOTTLES DRAWN AEROBIC AND ANAEROBIC Blood Culture adequate volume   Culture  Setup Time   Final    GRAM POSITIVE COCCI IN CHAINS IN BOTH AEROBIC AND ANAEROBIC BOTTLES CRITICAL RESULT CALLED TO, READ BACK BY AND VERIFIED WITHBronwen Betters [facility] 2505 03/17/19 A BROWNING Performed at [facility], [place] [address] [place], [place] [address]    Culture STREPTOCOCCUS GROUP G (A)  Final   Report Status 03/19/2019 FINAL  Final   Organism ID, Bacteria STREPTOCOCCUS GROUP G  Final      Susceptibility   Streptococcus group g - MIC*    CLINDAMYCIN <=0.25 SENSITIVE Sensitive     AMPICILLIN <=0.25 SENSITIVE Sensitive     ERYTHROMYCIN <=0.12 SENSITIVE Sensitive     VANCOMYCIN 0.5 SENSITIVE Sensitive     CEFTRIAXONE <=0.12 SENSITIVE Sensitive     LEVOFLOXACIN 0.5 SENSITIVE Sensitive     PENICILLIN Value in next row Sensitive      SENSITIVE<=0.06    * STREPTOCOCCUS GROUP G  Blood Culture ID Panel (Reflexed)     Status: Abnormal   Collection Time: 03/16/19 11:59 PM  Result Value Ref Range Status   Enterococcus species NOT DETECTED NOT DETECTED Final   Listeria monocytogenes NOT DETECTED NOT DETECTED Final   Staphylococcus species NOT DETECTED NOT DETECTED Final   Staphylococcus aureus (BCID) NOT DETECTED NOT DETECTED Final   Streptococcus species DETECTED (A) NOT DETECTED Final    Comment: Not Enterococcus species, Streptococcus agalactiae, Streptococcus pyogenes, or Streptococcus pneumoniae. CRITICAL RESULT CALLED TO, READ BACK BY AND VERIFIED WITH: L CURRAN PHARMD 1741 03/17/19 A BROWNING    Streptococcus agalactiae NOT DETECTED NOT DETECTED Final   Streptococcus pneumoniae NOT DETECTED NOT DETECTED Final   Streptococcus pyogenes NOT DETECTED NOT DETECTED Final   Acinetobacter  baumannii NOT DETECTED NOT DETECTED Final  Enterobacteriaceae species NOT DETECTED NOT DETECTED Final   Enterobacter cloacae complex NOT DETECTED NOT DETECTED Final   Escherichia coli NOT DETECTED NOT DETECTED Final   Klebsiella oxytoca NOT DETECTED NOT DETECTED Final   Klebsiella pneumoniae NOT DETECTED NOT DETECTED Final   Proteus species NOT DETECTED NOT DETECTED Final   Serratia marcescens NOT DETECTED NOT DETECTED Final   Haemophilus influenzae NOT DETECTED NOT DETECTED Final   Neisseria meningitidis NOT DETECTED NOT DETECTED Final   Pseudomonas aeruginosa NOT DETECTED NOT DETECTED Final   Candida albicans NOT DETECTED NOT DETECTED Final   Candida glabrata NOT DETECTED NOT DETECTED Final   Candida krusei NOT DETECTED NOT DETECTED Final   Candida parapsilosis NOT DETECTED NOT DETECTED Final   Candida tropicalis NOT DETECTED NOT DETECTED Final    Comment: Performed at [facility], [place] [address], [place], [place] [address]  SARS CORONAVIRUS 2 (TAT 6-24 HRS) Nasopharyngeal Nasopharyngeal Swab     Status: None   Collection Time: 03/17/19 12:32 AM   Specimen: Nasopharyngeal Swab  Result Value Ref Range Status   SARS Coronavirus 2 NEGATIVE NEGATIVE Final    Comment: (NOTE) SARS-CoV-2 target nucleic acids are NOT DETECTED. The SARS-CoV-2 RNA is generally detectable in upper and lower respiratory specimens during the acute phase of infection. Negative results do not preclude SARS-CoV-2 infection, do not rule out co-infections with other pathogens, and should not be used as the sole basis for treatment or other patient management decisions. Negative results must be combined with clinical observations, patient history, and epidemiological information. The expected result is Negative. Fact Sheet for Patients: SugarRoll.be Fact Sheet for Healthcare Providers: https://www.woods-mathews.com/ This test is not yet approved or cleared by the  [place] FDA and  has been authorized for detection and/or diagnosis of SARS-CoV-2 by FDA under an Emergency Use Authorization (EUA). This EUA will remain  in effect (meaning this test can be used) for the duration of the COVID-19 declaration under Section 56 4(b)(1) of the Act, 21 U.S.C. section 360bbb-3(b)(1), unless the authorization is terminated or revoked sooner. Performed at [facility], [place] [address] [place], [place] [address]   Urine culture     Status: Abnormal   Collection Time: 03/17/19 12:54 AM   Specimen: Urine, Random  Result Value Ref Range Status   Specimen Description URINE, RANDOM  Final   Special Requests   Final    NONE Performed at [facility], [place] [address] [place], [place] [address]    Culture MULTIPLE SPECIES PRESENT, SUGGEST RECOLLECTION (A)  Final   Report Status 03/18/2019 FINAL  Final  Culture, blood (Routine X 2) w Reflex to ID Panel     Status: None   Collection Time: 03/19/19  4:02 PM   Specimen: BLOOD RIGHT HAND  Result Value Ref Range Status   Specimen Description BLOOD RIGHT HAND  Final   Special Requests   Final    BOTTLES DRAWN AEROBIC AND ANAEROBIC Blood Culture results may not be optimal due to an inadequate volume of blood received in culture bottles   Culture   Final    NO GROWTH 5 DAYS Performed at [facility], [place] [address], [place], [place] [address]    Report Status 03/24/2019 FINAL  Final  Culture, blood (Routine X 2) w Reflex to ID Panel     Status: None   Collection Time: 03/19/19  4:10 PM   Specimen: BLOOD RIGHT HAND  Result Value Ref Range Status  Specimen Description BLOOD RIGHT HAND  Final   Special Requests   Final    BOTTLES DRAWN AEROBIC AND ANAEROBIC Blood Culture results may not be optimal due to an inadequate volume of blood received in culture bottles   Culture   Final    NO GROWTH 5 DAYS Performed at [facility], [place] [address] [place], [place] [address]    Report Status  03/24/2019 FINAL  Final    Radiology Reports DG Chest Port 1 View  Result Date: 03/16/2019 CLINICAL DATA:  Shortness of breath EXAM: PORTABLE CHEST 1 VIEW COMPARISON:  July 10, 2018 FINDINGS: The heart size is significantly enlarged. The patient is status post prior median sternotomy. Aortic calcifications are noted. There is a multi lead right-sided pacemaker in place. There is vascular congestion without overt pulmonary edema. There is no pneumothorax or large pleural effusion. There is no acute osseous abnormality. IMPRESSION: Cardiomegaly with mild volume overload. Electronically Signed   By: Constance Holster M.D.   On: 03/16/2019 23:50   DG Ankle Right Port  Result Date: 03/20/2019 CLINICAL DATA:  Right ankle pain., Redness and swelling. EXAM: PORTABLE RIGHT ANKLE - 2 VIEW COMPARISON:  None. FINDINGS: There is no acute bony or joint abnormality. No soft tissue gas or radiopaque foreign body. Soft tissues are diffusely swollen. Atherosclerosis noted. No radiopaque foreign body. IMPRESSION: Diffuse soft tissue swelling about the ankle could be due to dependent change or cellulitis. The exam is otherwise negative. Electronically Signed   By: Inge Rise M.D.   On: 03/20/2019 14:51   ECHOCARDIOGRAM COMPLETE  Result Date: 03/19/2019   ECHOCARDIOGRAM REPORT   Patient Name:   JESAIAH FABIANO [place] Date of Exam: 03/19/2019 Medical Rec #:  [id number]       Height:       71.0 in Accession #:    [id number]      Weight:       345.7 lb Date of Birth:  Jul 09, 1945      BSA:          2.66 m Patient Age:    46 years        BP:           99/43 mmHg Patient Gender: M               HR:           60 bpm. Exam Location:  Inpatient Procedure: 2D Echo, Cardiac Doppler, Color Doppler and Intracardiac            Opacification Agent                            MODIFIED REPORT: This report was modified by Eleonore Chiquito MD on 03/19/2019 due to error.  Indications:     Bacteremia.  History:         Patient has prior history of  Echocardiogram examinations, most                  recent 02/14/2019. Non- ischemic cardiomyopathy and CHF,                  Pacemaker and Abnormal ECG, TIA and Pulmonary HTN, Mitral Valve                  Disease, Arrythmias:Complete heart block; Risk                  Factors:Hypertension. Mitral Valve: mechanical  valve is present                  in the mitral position.  Sonographer:     Roseanna Rainbow RDCS Referring Phys:  [id number] [name] Diagnosing Phys: Eleonore Chiquito MD  Sonographer Comments: Technically difficult study due to poor echo windows, Technically challenging study due to limited acoustic windows, patient is morbidly obese, suboptimal subcostal window, suboptimal apical window and suboptimal parasternal window.  Image acquisition challenging due to patient body habitus. IMPRESSIONS  1. No evidence of infective endocarditis on this study. A TEE is recommended to exclude endocarditis.  2. The patient is s/p myomectomy with mechanical MVR and AoV repair (reported in record). Overall, the EF remains unchanged with prior. The mechanical MV prosthesis is functioning normally. The AoV is severely calcified with restricted movement. I suspect there is at least mild to moderate aortic stenosis, but doppler interrogation on this study is suboptimal. The tricuspid regurgitation on this study is severe with systolic hepatic vein flow reversal (not well evaluated on last study). Findings compared directly with study from 02/14/2019.  3. Left ventricular ejection fraction, by visual estimation, is 55 to 60%. The left ventricle has normal function. There is mildly increased left ventricular hypertrophy.  4. Definity contrast agent was given IV to delineate the left ventricular endocardial borders.  5. Left ventricular diastolic function could not be evaluated.  6. Right ventricular volume overload.  7. The left ventricle has no regional wall motion abnormalities.  8. Global right ventricle has severely reduced  systolic function.The right ventricular size is severely enlarged. No increase in right ventricular wall thickness.  9. Left atrial size was mild-moderately dilated. 10. Right atrial size was severely dilated. 11. The mitral valve has been repaired/replaced. No evidence of mitral valve regurgitation. 12. Mechanical mitral valve present. Mean gradient 5 mmHG with heart rate 60 bpm. EOA 5 mmHG. Normal functioning prosthetic valve with stable gradients compared with prior. 13. The tricuspid valve is grossly normal. Tricuspid valve regurgitation is severe. 14. Aortic valve regurgitation is mild. Mild to moderate aortic valve stenosis. 15. The aortic valve is severely calcified with restricted leaflet motion. Doppler interrogation is poor on this study. Vmax 2.5 m/s, Mean gradient ~8.5 mmHG, but suspect this is severely underestimated. There is likely a mild to moderate degree of aortic stenosis present. 16. The pulmonic valve was grossly normal. Pulmonic valve regurgitation is not visualized. 17. Mild plaque invoving the aortic root. 18. Normal pulmonary artery systolic pressure. 19. The tricuspid regurgitant velocity is 1.76 m/s, and with an assumed right atrial pressure of 15 mmHg, the estimated right ventricular systolic pressure is normal at 27.4 mmHg. 20. A pacer wire is visualized in the RA and RV. FINDINGS  Left Ventricle: Left ventricular ejection fraction, by visual estimation, is 55 to 60%. The left ventricle has normal function. Definity contrast agent was given IV to delineate the left ventricular endocardial borders. The left ventricle has no regional wall motion abnormalities. There is mildly increased left ventricular hypertrophy. Concentric left ventricular hypertrophy. The interventricular septum is flattened in diastole ('D' shaped left ventricle), consistent with right ventricular volume overload. The left ventricular diastology could not be evaluated due to mitral valve replacement/repair. Left  ventricular diastolic function could not be evaluated. Right Ventricle: The right ventricular size is severely enlarged. No increase in right ventricular wall thickness. Global RV systolic function is has severely reduced systolic function. The tricuspid regurgitant velocity is 1.76 m/s, and with an assumed right atrial  pressure of 15 mmHg, the estimated right ventricular systolic pressure is normal at 27.4 mmHg. Left Atrium: Left atrial size was mild-moderately dilated. Right Atrium: Right atrial size was severely dilated Pericardium: There is no evidence of pericardial effusion. Mitral Valve: The mitral valve has been repaired/replaced. No evidence of mitral valve regurgitation. MV peak gradient, 19.1 mmHg. Mechanical mitral valve present. Mean gradient 5 mmHG with heart rate 60 bpm. EOA 5 mmHG. Normal functioning prosthetic valve with stable gradients compared with prior. Tricuspid Valve: The tricuspid valve is grossly normal. Tricuspid valve regurgitation is severe. The flow in the hepatic veins is reversed during ventricular systole. Aortic Valve: The aortic valve has been repaired/replaced. Aortic valve regurgitation is mild. Mild to moderate aortic stenosis is present. Aortic valve mean gradient measures 8.5 mmHg. Aortic valve peak gradient measures 16.2 mmHg. Aortic valve area, by  VTI measures 2.40 cm. The aortic valve is severely calcified with restricted leaflet motion. Doppler interrogation is poor on this study. Vmax 2.5 m/s, Mean gradient ~8.5 mmHG, but suspect this is severely underestimated. There is likely a mild to moderate degree of aortic stenosis present. Pulmonic Valve: The pulmonic valve was grossly normal. Pulmonic valve regurgitation is not visualized. Pulmonic regurgitation is not visualized. Aorta: The aortic root is normal in size and structure. There is mild, layered plaque involving the aortic root. IAS/Shunts: No atrial level shunt detected by color flow Doppler. Additional Comments:  A pacer wire is visualized in the right atrium and right ventricle.  LEFT VENTRICLE PLAX 2D LVIDd:         4.10 cm       Diastology LVIDs:         3.00 cm       LV e' lateral:   11.70 cm/s LV PW:         2.10 cm       LV E/e' lateral: 12.3 LV IVS:        1.70 cm       LV e' medial:    8.59 cm/s LVOT diam:     2.20 cm       LV E/e' medial:  16.8 LV SV:         39 ml LV SV Index:   13.60 LVOT Area:     3.80 cm  LV Volumes (MOD) LV area d, A2C:    33.90 cm LV area d, A4C:    38.70 cm LV area s, A2C:    23.20 cm LV area s, A4C:    25.93 cm LV major d, A2C:   7.96 cm LV major d, A4C:   8.64 cm LV major s, A2C:   7.74 cm LV major s, A4C:   8.04 cm LV vol d, MOD A2C: 120.0 ml LV vol d, MOD A4C: 147.4 ml LV vol s, MOD A2C: 58.2 ml LV vol s, MOD A4C: 71.6 ml LV SV MOD A2C:     61.8 ml LV SV MOD A4C:     147.4 ml LV SV MOD BP:      71.1 ml RIGHT VENTRICLE            IVC RV S prime:     8.81 cm/s  IVC diam: 4.70 cm TAPSE (M-mode): 1.5 cm LEFT ATRIUM            Index       RIGHT ATRIUM           Index LA diam:      6.40 cm  2.41 cm/m  RA Area:     42.00 cm LA Vol (A2C): 108.0 ml 40.59 ml/m RA Volume:   177.00 ml 66.52 ml/m LA Vol (A4C): 147.0 ml 55.25 ml/m  AORTIC VALVE AV Area (Vmax):    2.51 cm AV Area (Vmean):   2.58 cm AV Area (VTI):     2.40 cm AV Vmax:           201.50 cm/s AV Vmean:          132.000 cm/s AV VTI:            0.419 m AV Peak Grad:      16.2 mmHg AV Mean Grad:      8.5 mmHg LVOT Vmax:         133.00 cm/s LVOT Vmean:        89.600 cm/s LVOT VTI:          0.264 m LVOT/AV VTI ratio: 0.63  AORTA Ao Root diam: 3.70 cm Ao Asc diam:  3.40 cm MITRAL VALVE                        TRICUSPID VALVE MV Area (PHT): 2.48 cm             TV Peak grad:   17.1 mmHg MV Peak grad:  19.1 mmHg            TV Mean grad:   5.0 mmHg MV Mean grad:  5.0 mmHg             TV Vmax:        2.07 m/s MV Vmax:       2.18 m/s             TV Vmean:       93.2 cm/s MV Vmean:      93.5 cm/s            TV VTI:         0.61 msec MV VTI:         0.54 m               TR Peak grad:   12.4 mmHg MV PHT:        88.74 msec           TR Vmax:        178.00 cm/s MV Decel Time: 306 msec MV E velocity: 144.00 cm/s 103 cm/s SHUNTS                                     Systemic VTI:  0.26 m                                     Systemic Diam: 2.20 cm  Eleonore Chiquito MD Electronically signed by Eleonore Chiquito MD Signature Date/Time: 03/19/2019/11:50:41 AM    Final (Updated)    ECHO TEE  Result Date: 03/21/2019   TRANSESOPHOGEAL ECHO REPORT   Patient Name:   Frank [name] Bartek Date of Exam: 03/21/2019 Medical Rec #:  [id number]       Height:       71.0 in Accession #:    [id number]      Weight:       341.5 lb Date of Birth:  February 09, 1946      BSA:  2.65 m Patient Age:    71 years        BP:           111/45 mmHg Patient Gender: M               HR:           62 bpm. Exam Location:  Inpatient  Procedure: Transesophageal Echo Indications:    bacteremia  History:        Patient has prior history of Echocardiogram examinations, most                 recent 03/19/2019. 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  Sonographer:    Jannett Celestine RDCS (AE) Referring Phys: [place]: Consent was requested emergently by emergency room physicain. The transesophogeal probe was passed through the esophogus of the patient. The patient's vital signs; including heart rate, blood pressure, and oxygen saturation; remained stable throughout the procedure. The patient developed no complications during the procedure. IMPRESSIONS  1. Left ventricular ejection fraction, by visual estimation, is 55 to 60%. The left ventricle has normal function. Normal left ventricular size. There is no left ventricular hypertrophy.  2. The left ventricle has no regional wall motion abnormalities.  3. Global right ventricle has normal systolic  function.The right ventricular size is normal. No increase in right ventricular wall thickness.  4. Both RA and RV pacer wires are normal. No vegetations.  5. Left atrial size was moderately dilated.  6. Right atrial size was normal.  7. The mitral valve has been repaired/replaced. No evidence of mitral valve regurgitation. No evidence of mitral stenosis.  8. Mechanical mitral valve is functioning normally. No abscess. No rocking.  9. The tricuspid valve is normal in structure. Tricuspid valve regurgitation is severe. 10. The aortic valve is tricuspid. Aortic valve regurgitation is mild. Mild to moderate aortic valve sclerosis/calcification without any evidence of aortic stenosis. 11. The pulmonic valve was normal in structure. Pulmonic valve regurgitation is mild. 12. A pacer wire is visualized. 13. The inferior vena cava is normal in size with greater than 50% respiratory variability, suggesting right atrial pressure of 3 mmHg. 14. No vegetations. No endocarditis. FINDINGS  Left Ventricle: Left ventricular ejection fraction, by visual estimation, is 55 to 60%. The left ventricle has normal function. The left ventricle has no regional wall motion abnormalities. There is no left ventricular hypertrophy. Normal left ventricular size. Normal left atrial pressure. Right Ventricle: The right ventricular size is normal. No increase in right ventricular wall thickness. Global RV systolic function is has normal systolic function. Both RA and RV pacer wires are normal. No vegetations. Left Atrium: Left atrial size was moderately dilated. Smoke noted in LA. Right Atrium: Right atrial size was normal in size Pericardium: There is no evidence of pericardial effusion. Mitral Valve: The mitral valve has been repaired/replaced. No evidence of mitral valve stenosis by observation. MV peak gradient, 18.5 mmHg. No evidence of mitral valve regurgitation. Mechanical mitral valve is functioning normally. No abscess. No rocking.  Tricuspid Valve: The tricuspid valve is normal in structure. Tricuspid valve regurgitation is severe. Aortic Valve: The aortic valve is tricuspid. Aortic valve regurgitation is mild. Mild to moderate aortic  valve sclerosis/calcification is present, without any evidence of aortic stenosis. Pulmonic Valve: The pulmonic valve was normal in structure. Pulmonic valve regurgitation is mild. Aorta: The aortic root, ascending aorta and aortic arch are all structurally normal, with no evidence of dilitation or obstruction. Venous: The inferior vena cava is normal in size with greater than 50% respiratory variability, suggesting right atrial pressure of 3 mmHg. Shunts: There is no evidence of a patent foramen ovale. No ventricular septal defect is seen or detected. There is no evidence of an atrial septal defect. No atrial level shunt detected by color flow Doppler. Additional Comments: No vegetations. No endocarditis. A pacer wire is visualized.  MITRAL VALVE             Normals MV Peak grad: 18.5 mmHg  4 mmHg MV Mean grad: 7.0 mmHg MV Vmax:      2.15 m/s MV Vmean:     125.0 cm/s MV VTI:       0.43 m  Candee Furbish MD Electronically signed by Candee Furbish MD Signature Date/Time: 03/21/2019/2:26:09 PM    Final    CUP PACEART REMOTE DEVICE CHECK  Result Date: 03/06/2019 Scheduled remote reviewed.  Normal device function.  No atrial arrhythmia detected since last remote on 02/25/2019 Next remote 91 days. Kathy Breach, RN, CCDS, [employer]  VAS [name] LOWER EXTREMITY VENOUS (DVT)  Result Date: 03/17/2019  Lower Venous Study Indications: Swelling, and Edema.  Limitations: Body habitus and poor ultrasound/tissue interface. Comparison Study: no prior Performing Technologist: Abram Sander RVS  Examination Guidelines: A complete evaluation includes B-mode imaging, spectral Doppler, color Doppler, and power Doppler as needed of all accessible portions of each vessel. Bilateral testing is considered an integral part of a  complete examination. Limited examinations for reoccurring indications may be performed as noted.  +---------+---------------+---------+-----------+----------+--------------+  RIGHT     Compressibility Phasicity Spontaneity Properties Thrombus Aging  +---------+---------------+---------+-----------+----------+--------------+  CFV       Full            Yes       Yes                                    +---------+---------------+---------+-----------+----------+--------------+  SFJ       Full                                                             +---------+---------------+---------+-----------+----------+--------------+  FV Prox   Full                                                             +---------+---------------+---------+-----------+----------+--------------+  FV Mid                    Yes       Yes                                    +---------+---------------+---------+-----------+----------+--------------+  FV Distal  Yes       Yes                                    +---------+---------------+---------+-----------+----------+--------------+  PFV       Full                                                             +---------+---------------+---------+-----------+----------+--------------+  POP       Full            Yes       Yes                                    +---------+---------------+---------+-----------+----------+--------------+  PTV                                                        Not visualized  +---------+---------------+---------+-----------+----------+--------------+  PERO                                                       Not visualized  +---------+---------------+---------+-----------+----------+--------------+   +----+---------------+---------+-----------+----------+--------------+  LEFT Compressibility Phasicity Spontaneity Properties Thrombus Aging  +----+---------------+---------+-----------+----------+--------------+  CFV                  Yes       Yes                                     +----+---------------+---------+-----------+----------+--------------+     Summary: Right: There is no evidence of deep vein thrombosis in the lower extremity. However, portions of this examination were limited- see technologist comments above. No cystic structure found in the popliteal fossa. Left: No evidence of common femoral vein obstruction.  *See table(s) above for measurements and observations. Electronically signed by Servando Snare MD on 03/17/2019 at 5:03:22 PM.    Final    [name] EKG SITE RITE  Result Date: 03/22/2019 If Site Rite image not attached, placement could not be confirmed due to current cardiac rhythm.    Time Spent in minutes  30     Desiree Hane M.D on 03/25/2019 at 7:22 PM  To page go to [email or web address] - password [email or web address]

## 2019-03-25 NOTE — Progress Notes (Signed)
ANTICOAGULATION CONSULT NOTE - Follow Up Consult  Pharmacy Consult for Coumadin Indication: MVR and afib.  Allergies  Allergen Reactions  . Warfarin And Related Other (See Comments)    COUMADIN or JANTOVEN BRAND ONLY** Per pt, was switched to generic in 2001 and had significantly decreased absorption > INR subtherapeutic> pt suffered a TIA and has been on brand name only since     Patient Measurements: Height: 5\' 11"  (180.3 cm) Weight: (!) 335 lb 15.7 oz (152.4 kg) IBW/kg (Calculated) : 75.3  Vital Signs: Temp: 98.8 F (37.1 C) (12/15 0823) Temp Source: Oral (12/15 0823) BP: 121/50 (12/15 0823) Pulse Rate: 60 (12/15 0823)  Labs: Recent Labs    03/23/19 0228 03/24/19 0326 03/25/19 0230  HGB 8.2* 8.6* 8.5*  HCT 25.8* 27.2* 25.9*  PLT 245 282 293  LABPROT 34.1* 32.3* 35.6*  INR 3.4* 3.1* 3.6*  HEPARINUNFRC 0.63  --   --   CREATININE 1.88* 1.62* 1.63*    Estimated Creatinine Clearance: 63.3 mL/min (A) (by C-G formula based on SCr of 1.63 mg/dL (H)).   Assessment:  Anticoag: Coumadin for mech MVR and Afib.  Chronic intermittent rectal/hemorrhoidal bleeding. INR 3.1>3.6 today. Hgb 8.5 stable. Plts WNL. - Home warfarin: 15mg  sun/wed, 10mg  AOD Use COUMADIN or JANTOVEN BRAND ONLY (See note for more detail)  - 12/12 Coumadin 10 mg administration not shown in MAR, but was given as reported by nurse  Goal of Therapy:  INR 2.5-3.5 Monitor platelets by anticoagulation protocol: Yes   Plan:  Coumadin 5 mg today  **Use home supply of brand Coumadin** 10 mg tablets in Main Rx (INR goal 2.5-3.5).    Bates Collington S. Alford Highland, PharmD, BCPS Clinical Staff Pharmacist Eilene Ghazi Stillinger 03/25/2019,10:42 AM

## 2019-03-25 NOTE — Progress Notes (Signed)
OT Cancellation Note  Patient Details Name: Frank [name] MRN: [id number] DOB: 1945-12-22   Cancelled Treatment:    Reason Eval/Treat Not Completed: Other (comment). Pt requesting OT reattempt later today or tomorrow. Currently eating lunch and also reports prolonged increased pain after standing with PT yesterday. Pt apprehensive to stand again right now. Encouraged ankle pumps and bed level ROM exercises to help prevent deconditioning. Plan to reattempt.   Tyrone Schimke, OT Acute Rehabilitation Services Pager: [phone] Office: [phone]  03/25/2019, 1:21 PM

## 2019-03-25 NOTE — Progress Notes (Signed)
    Subjective:  Talkative does not like renal diet   Objective:  Vitals:   03/24/19 1959 03/25/19 0022 03/25/19 0413 03/25/19 0823  BP: 122/60 (!) 130/53 112/65 (!) 121/50  Pulse: 64 61 67 60  Resp: 11 15 18 12   Temp: 98.7 F (37.1 C) 97.8 F (36.6 C) 99 F (37.2 C) 98.8 F (37.1 C)  TempSrc: Oral Oral Oral Oral  SpO2: 99% 98% 99% 99%  Weight:   (!) 152.4 kg   Height:        Intake/Output from previous day:  Intake/Output Summary (Last 24 hours) at 03/25/2019 0843 Last data filed at 03/25/2019 0600 Gross per 24 hour  Intake 1240.87 ml  Output 2675 ml  Net -1434.13 ml    Physical Exam: Affect appropriate Obese white male  HEENT: normal Neck supple with no adenopathy JVP normal no bruits no thyromegaly Lungs clear with no wheezing and good diaphragmatic motion Heart:  S1/S2 no murmur, no rub, gallop or click PMI normal Abdomen: benighn, BS positve, no tenderness, no AAA no bruit.  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 No results for input(s): LIPASE, AMYLASE in the last 72 hours. CBC: Recent Labs    03/24/19 0326 03/25/19 0230  WBC 9.5 9.4  HGB 8.6* 8.5*  HCT 27.2* 25.9*  MCV 98.6 96.3  PLT 282 293    Imaging: No results found.  Cardiac Studies:  ECG: V pacing stable    Telemetry: V pacing likely underlying afib   Echo: TEE 12/11 normal functioning MVR mild AS across repaired AV RV failure no SBE   Medications:   . furosemide  60 mg Intravenous BID  . metolazone  5 mg  Oral Daily  . psyllium  1 packet Oral Daily  . Warfarin - Pharmacist Dosing Inpatient   Does not apply q1800     . penicillin g continuous IV infusion 12 Million Units (03/25/19 0018)    Assessment/Plan:   Right sided CHF:  Likely related to OSA/ obesity hypoventilation and multiple leads inserted and removed across TV.zaroxyln started yesterday continue losix iv bid With chronic changes , lymphedema and cellulitis will take weeks for RLE to improve  MVR:  No SBE on TEE INR Rx to high continue coumadin   CRT/PPM:  Normal function per EP   Cellulitis:  On iv PCN per primary service   Jenkins Rouge 03/25/2019, 8:43 AM

## 2019-03-26 DIAGNOSIS — Z9889 Other specified postprocedural states: Secondary | ICD-10-CM

## 2019-03-26 LAB — CBC
HCT: 26 % — ABNORMAL LOW (ref 39.0–52.0)
Hemoglobin: 8.2 g/dL — ABNORMAL LOW (ref 13.0–17.0)
MCH: 30.5 pg (ref 26.0–34.0)
MCHC: 31.5 g/dL (ref 30.0–36.0)
MCV: 96.7 fL (ref 80.0–100.0)
Platelets: 304 10*3/uL (ref 150–400)
RBC: 2.69 MIL/uL — ABNORMAL LOW (ref 4.22–5.81)
RDW: 17 % — ABNORMAL HIGH (ref 11.5–15.5)
WBC: 10.3 10*3/uL (ref 4.0–10.5)
nRBC: 0.3 % — ABNORMAL HIGH (ref 0.0–0.2)

## 2019-03-26 LAB — BASIC METABOLIC PANEL
Anion gap: 15 (ref 5–15)
BUN: 59 mg/dL — ABNORMAL HIGH (ref 8–23)
CO2: 30 mmol/L (ref 22–32)
Calcium: 8.6 mg/dL — ABNORMAL LOW (ref 8.9–10.3)
Chloride: 88 mmol/L — ABNORMAL LOW (ref 98–111)
Creatinine, Ser: 1.53 mg/dL — ABNORMAL HIGH (ref 0.61–1.24)
GFR calc Af Amer: 52 mL/min — ABNORMAL LOW (ref >60)
GFR calc non Af Amer: 45 mL/min — ABNORMAL LOW (ref >60)
Glucose, Bld: 105 mg/dL — ABNORMAL HIGH (ref 70–99)
Potassium: 3.3 mmol/L — ABNORMAL LOW (ref 3.5–5.1)
Sodium: 133 mmol/L — ABNORMAL LOW (ref 135–145)

## 2019-03-26 LAB — PROTIME-INR
INR: 4.4 (ref 0.8–1.2)
Prothrombin Time: 42.3 seconds — ABNORMAL HIGH (ref 11.4–15.2)

## 2019-03-26 MED ORDER — POTASSIUM CHLORIDE CRYS ER 20 MEQ PO TBCR
40.0000 meq | EXTENDED_RELEASE_TABLET | Freq: Once | ORAL | Status: AC
  Administered 2019-03-26: 08:00:00 40 meq via ORAL
  Filled 2019-03-26: qty 2

## 2019-03-26 NOTE — Progress Notes (Signed)
ANTICOAGULATION CONSULT NOTE - Follow Up Consult  Pharmacy Consult for Coumadin Indication: MVR and afib.  Allergies  Allergen Reactions  . Warfarin And Related Other (See Comments)    COUMADIN or JANTOVEN BRAND ONLY** Per pt, was switched to generic in 2001 and had significantly decreased absorption > INR subtherapeutic> pt suffered a TIA and has been on brand name only since     Patient Measurements: Height: 5\' 11"  (180.3 cm) Weight: (!) 335 lb 12.2 oz (152.3 kg) IBW/kg (Calculated) : 75.3  Vital Signs: Temp: 97.6 F (36.4 C) (12/16 0824) Temp Source: Oral (12/16 0824) BP: 106/78 (12/16 0824) Pulse Rate: 67 (12/16 0824)  Labs: Recent Labs    03/24/19 0326 03/25/19 0230 03/26/19 0334  HGB 8.6* 8.5* 8.2*  HCT 27.2* 25.9* 26.0*  PLT 282 293 304  LABPROT 32.3* 35.6* 42.3*  INR 3.1* 3.6* 4.4*  CREATININE 1.62* 1.63* 1.53*    Estimated Creatinine Clearance: 66.5 mL/min (A) (by C-G formula based on SCr of 1.53 mg/dL (H)).   Assessment:  Anticoag: Coumadin for mech MVR and Afib.  Chronic intermittent rectal/hemorrhoidal bleeding. INR 3.1>3.6>4.4 today. Hgb 8.2 stable. Plts WNL. - Home warfarin: 15mg  sun/wed, 10mg  AOD Use COUMADIN or JANTOVEN BRAND ONLY (See note for more detail)  - 12/12 Coumadin 10 mg administration not shown in MAR, but was given as reported by nurse  Goal of Therapy:  INR 2.5-3.5 Monitor platelets by anticoagulation protocol: Yes   Plan:  Hold Coumadin today **Use home supply of brand Coumadin** 10 mg tablets in Main Rx (INR goal 2.5-3.5).  - Resume daily K+? Took 168meq daily PTA!   Erikson Danzy S. Alford Highland, PharmD, BCPS Clinical Staff Pharmacist Eilene Ghazi Stillinger 03/26/2019,9:35 AM

## 2019-03-26 NOTE — Progress Notes (Signed)
PT Cancellation Note  Patient Details Name: Frank [name] MRN: [id number] DOB: 08-Feb-1946   Cancelled Treatment:    Reason Eval/Treat Not Completed: Patient declined, no reason specified Pt declined participating in therapy at this time due to painful R LE. Pt premedicated before attempting to see for PT tx. This therapist took slide board and w/c to work on [place] mobility with decreased weight bearing on R LE so maybe this could be an option for next session if still as painful. PT will continue to follow acutely.    Earney Navy, PTA Acute Rehabilitation Services Pager: [phone] Office: [phone]   03/26/2019, 10:47 AM

## 2019-03-26 NOTE — Progress Notes (Addendum)
    Subjective:  Still with too much pain to ambulate Still upset about diet and fluid restriction In no rush to go home   Objective:  Vitals:   03/25/19 2004 03/25/19 2349 03/26/19 0345 03/26/19 0824  BP: (!) 126/49 (!) 116/43 (!) 108/43 106/78  Pulse: 65 66 64 67  Resp: 20 18 20 19   Temp: 98.5 F (36.9 C) 97.8 F (36.6 C) 98.5 F (36.9 C) 97.6 F (36.4 C)  TempSrc: Oral Oral Oral Oral  SpO2: 99% 98% 97% 95%  Weight:   (!) 152.3 kg   Height:        Intake/Output from previous day:  Intake/Output Summary (Last 24 hours) at 03/26/2019 0901 Last data filed at 03/26/2019 0645 Gross per 24 hour  Intake 780 ml  Output 3280 ml  Net -2500 ml    Physical Exam: Affect appropriate Obese white male  HEENT: normal Neck supple with no adenopathy JVP normal no bruits no thyromegaly Lungs clear with no wheezing and good diaphragmatic motion Heart:  S1 click /S2 SEM AV murmur, no rub, gallop or click PMI normal AICD under left clavicle  Abdomen: benighn, BS positve, no tenderness, no AAA no bruit.  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 No results for input(s): LIPASE, AMYLASE in the last 72 hours. CBC: Recent Labs    03/25/19 0230 03/26/19 0334  WBC 9.4 10.3  HGB 8.5* 8.2*  HCT 25.9* 26.0*  MCV 96.3 96.7  PLT 293 304    Imaging: No results found.  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 furosemide  60 mg Intravenous BID  . metolazone  5 mg Oral Daily  . psyllium  1 packet Oral Daily  . Warfarin - Pharmacist Dosing Inpatient   Does not apply q1800     . penicillin g continuous IV infusion 12 Million Units (03/26/19 0000)    Assessment/Plan:   Right sided CHF:  Likely related to OSA/ obesity hypoventilation and multiple leads inserted and removed across TV.zaroxyln started 12/14  continue lasix iv bid With chronic changes , lymphedema and cellulitis will take weeks for RLE to improve Consider skin wound consult   MVR:  No SBE on TEE INR Rx to high continue coumadin   CRT/PPM:  Normal function per EP   Cellulitis:  On iv PCN per primary service   Cardiology will follow from a distance   Jenkins Rouge 03/26/2019, 9:01 AM

## 2019-03-26 NOTE — Progress Notes (Signed)
PROGRESS NOTE   Frank [name]  GMW:[id number]    DOB: 05/10/1945    DOA: 03/16/2019  PCP: [name], MD   I have briefly reviewed patients previous medical records in [facility].  Chief Complaint:   Chief Complaint  Patient presents with  . 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  In ED febrile to 103 F, hypoxic to 86% on room air, leukocytosis, elevated lactate and creatinine.  Admitted for sepsis due to group G strep bacteremia from severe right lower extremity cellulitis.  Hospital course complicated by decompensated combined CHF, cardiology consulted.   Assessment & Plan:  Principal Problem:   UTI (urinary tract infection) Active Problems:   MORBID OBESITY   S/P aortic valve repair   S/P mitral valve replacement-Mechanical   Chronic combined systolic and diastolic heart failure (HCC)   Complete heart block (HCC)   Rectal bleeding   Sepsis (HCC)   Acute respiratory failure with hypoxia (HCC)   Acute exacerbation of CHF (congestive heart failure) (HCC)   AKI (acute kidney injury) (HCC)   Group G streptococcal infection   Bacteremia   Hypotension   Bullae   Sepsis due to group G strep bacteremia from right lower extremity nonpurulent cellulitis complicating underlying chronic lymphedema  Patient has large bullae complicating his cellulitis and edema, the one on his right calf has ruptured draining copious serosanguineous fluid soaking his dressing.  Elevate right lower extremity.  Consider [place] consultation to see if leg wrapping would be helpful.  As per prior  hospitalist discussion with ID, continue IV penicillin G continuous x14 days (end date 04/01/2019).  Alternate option would be IV ceftriaxone 2 g daily.   Will need long-term IV access by IR, will consider consulting tomorrow.  Although initially sepsis felt to be due to possible UTI, patient asymptomatic of UTI symptoms and no bacteriuria.  Surveillance blood cultures from 12/9: Negative, final report.  Acute on chronic combined systolic and diastolic CHF/right-sided CHF  Cardiology following.  I discussed with Dr. Johnsie Cancel on 12/16 and they will follow along from a distance.  Right-sided CHF likely related to OSA/OHS.  As per cardiology, continue IV Lasix 60 mg twice daily and metolazone 5 mg daily which was started on 12/14.  PTA patient was on torsemide 60 mg twice daily and metolazone.  Home spironolactone on hold.  Acute kidney injury complicating stage III chronic kidney disease/hyperkalemia  Possibly due to hypotension from sepsis/bacteremia.  Peak creatinine 3.65, now back to baseline 1.2-1.6.  Holding home losartan.  Follow BMP closely while on aggressive IV/p.o. diuretics diuretics.  Acute hypoxic respiratory failure  Suspect from decompensated CHF complicating underlying OSA/OHS.  Resolved.  Hypotension  Suspected due to sepsis/bacteremia  Resolved.  Mechanical mitral and aortic valve  Supratherapeutic INR on admission requiring vitamin K.  TTE and TEE negative for prosthetic valve endocarditis.  INR slightly supratherapeutic at 4.4, goal 2.5-3.5.  Coumadin per pharmacy.  Anemia secondary to iron deficiency  Hemoglobin stable in the low 8 g range.  Consider iron supplements at discharge.  Paroxysmal atrial fibrillation, complete heart block s/p permanent pacemaker  Followed by Dr. [name], [facility] as outpatient.  Chronic right lower extremity  edema  Venous Doppler negative for DVT.  Rest management as above.  Rectal bleeding  Suspected due  to hemorrhoids in the setting of supratherapeutic INR.  Seems to have resolved.  Continue to monitor.  Hypervolemic hyponatremia  Stable in the low 130s.  Body mass index is 46.83 kg/m./Morbid obesity  Consider outpatient evaluation for OSA.  Hypokalemia  Replace and follow.   DVT prophylaxis: Anticoagulated on warfarin Code Status: Full Family Communication: None at bedside Disposition: DC home pending clinical improvement   Consultants:   Cardiology ID  Procedures:   TTE 12/9 TEE 12/11 Lower extremity venous duplex 12/7  Antimicrobials:   IV penicillin G continuous   Subjective:  Patient reports 6/10 right lower extremity pain and 5/10 low back pain, states that it got worse after physical therapy on Monday.  Also upset regarding fluid restriction and does not know why he is on it.  Denies dyspnea or chest pain  Objective:   Vitals:   03/26/19 0345 03/26/19 0824 03/26/19 1201 03/26/19 1616  BP: (!) 108/43 106/78 (!) 147/59 (!) 118/50  Pulse: 64 67 67 62  Resp: 20 19 18 18   Temp: 98.5 F (36.9 C) 97.6 F (36.4 C) 97.8 F (36.6 C) 98.7 F (37.1 C)  TempSrc: Oral Oral Oral Oral  SpO2: 97% 95% 95% 97%  Weight: (!) 152.3 kg     Height:        General exam: Elderly male, moderately built and morbidly obese lying comfortably propped up in bed without distress. Respiratory system: Clear to auscultation. Respiratory effort normal. Cardiovascular system: S1 & S2 heard, RRR. No JVD, murmurs, rubs, gallops or clicks.  Trace left leg edema.  Right lower extremity edema difficult to appreciate due to cellulitis findings and ruptured bullae.  Telemetry personally reviewed: AV paced rhythm. Gastrointestinal system: Abdomen is nondistended, soft and nontender. No organomegaly or masses felt. Normal bowel sounds heard. Central nervous system: Alert and oriented. No focal neurological deficits. Extremities: Symmetric 5 x 5 power. Skin: Right leg with significant swelling,  erythema some of it appears subacute/chronic with multiple large bullae, the one on right calf has ruptured with dressing soaked with serosanguineous fluid.  No increased in warmth.  No tenderness.  Erythema seems to have receded from marking made earlier on in admission. Psychiatry: Judgement and insight appear normal. Mood & affect appropriate.     Data Reviewed:   I have personally reviewed following labs and imaging studies   CBC: Recent Labs  Lab 03/24/19 0326 03/25/19 0230 03/26/19 0334  WBC 9.5 9.4 10.3  HGB 8.6* 8.5* 8.2*  HCT 27.2* 25.9* 26.0*  MCV 98.6 96.3 96.7  PLT 282 293 347    Basic Metabolic Panel: Recent Labs  Lab 03/24/19 0326 03/25/19 0230 03/26/19 0334  NA 133* 133* 133*  K 4.0 3.6 3.3*  CL 94* 92* 88*  CO2 27 30 30   GLUCOSE 112* 112* 105*  BUN 69* 64* 59*  CREATININE 1.62* 1.63* 1.53*  CALCIUM 8.8* 8.8* 8.6*    Liver Function Tests: No results for input(s): AST, ALT, ALKPHOS, BILITOT, PROT, ALBUMIN in the last 168 hours.  CBG: No results for input(s): GLUCAP in the last 168 hours.  Microbiology Studies:   Recent Results (from the past 240 hour(s))  Blood culture (routine x 2)     Status: Abnormal   Collection Time: 03/16/19 11:59 PM   Specimen: BLOOD  Result Value Ref Range Status   Specimen Description BLOOD RIGHT HAND  Final  Special Requests   Final    BOTTLES DRAWN AEROBIC AND ANAEROBIC Blood Culture results may not be optimal due to an inadequate volume of blood received in culture bottles   Culture  Setup Time   Final    GRAM POSITIVE COCCI IN CHAINS AEROBIC BOTTLE ONLY CRITICAL RESULT CALLED TO, READ BACK BY AND VERIFIED WITH: E. SINCLAIR, PHARMD AT 1530 ON 03/17/19 BY C. JESSUP, MT.    Culture (A)  Final    STREPTOCOCCUS GROUP G SUSCEPTIBILITIES PERFORMED ON PREVIOUS CULTURE WITHIN THE LAST 5 DAYS. Performed at [facility], [place] [address], [place], [place] [address]    Report Status 03/19/2019 FINAL  Final  Blood  culture (routine x 2)     Status: Abnormal   Collection Time: 03/16/19 11:59 PM   Specimen: BLOOD  Result Value Ref Range Status   Specimen Description BLOOD LEFT HAND  Final   Special Requests   Final    BOTTLES DRAWN AEROBIC AND ANAEROBIC Blood Culture adequate volume   Culture  Setup Time   Final    GRAM POSITIVE COCCI IN CHAINS IN BOTH AEROBIC AND ANAEROBIC BOTTLES CRITICAL RESULT CALLED TO, READ BACK BY AND VERIFIED WITHBronwen Betters [facility] 8315 03/17/19 A BROWNING Performed at [facility], [place] [address], [place], [place] [address]    Culture STREPTOCOCCUS GROUP G (A)  Final   Report Status 03/19/2019 FINAL  Final   Organism ID, Bacteria STREPTOCOCCUS GROUP G  Final      Susceptibility   Streptococcus group g - MIC*    CLINDAMYCIN <=0.25 SENSITIVE Sensitive     AMPICILLIN <=0.25 SENSITIVE Sensitive     ERYTHROMYCIN <=0.12 SENSITIVE Sensitive     VANCOMYCIN 0.5 SENSITIVE Sensitive     CEFTRIAXONE <=0.12 SENSITIVE Sensitive     LEVOFLOXACIN 0.5 SENSITIVE Sensitive     PENICILLIN Value in next row Sensitive      SENSITIVE<=0.06    * STREPTOCOCCUS GROUP G  Blood Culture ID Panel (Reflexed)     Status: Abnormal   Collection Time: 03/16/19 11:59 PM  Result Value Ref Range Status   Enterococcus species NOT DETECTED NOT DETECTED Final   Listeria monocytogenes NOT DETECTED NOT DETECTED Final   Staphylococcus species NOT DETECTED NOT DETECTED Final   Staphylococcus aureus (BCID) NOT DETECTED NOT DETECTED Final   Streptococcus species DETECTED (A) NOT DETECTED Final    Comment: Not Enterococcus species, Streptococcus agalactiae, Streptococcus pyogenes, or Streptococcus pneumoniae. CRITICAL RESULT CALLED TO, READ BACK BY AND VERIFIED WITH: Bronwen Betters PHARMD [id number] 03/17/19 A BROWNING    Streptococcus agalactiae NOT DETECTED NOT DETECTED Final   Streptococcus pneumoniae NOT DETECTED NOT DETECTED Final   Streptococcus pyogenes NOT DETECTED NOT DETECTED Final   Acinetobacter baumannii  NOT DETECTED NOT DETECTED Final   Enterobacteriaceae species NOT DETECTED NOT DETECTED Final   Enterobacter cloacae complex NOT DETECTED NOT DETECTED Final   Escherichia coli NOT DETECTED NOT DETECTED Final   Klebsiella oxytoca NOT DETECTED NOT DETECTED Final   Klebsiella pneumoniae NOT DETECTED NOT DETECTED Final   Proteus species NOT DETECTED NOT DETECTED Final   Serratia marcescens NOT DETECTED NOT DETECTED Final   Haemophilus influenzae NOT DETECTED NOT DETECTED Final   Neisseria meningitidis NOT DETECTED NOT DETECTED Final   Pseudomonas aeruginosa NOT DETECTED NOT DETECTED Final   Candida albicans NOT DETECTED NOT DETECTED Final   Candida glabrata NOT DETECTED NOT DETECTED Final   Candida krusei NOT DETECTED NOT DETECTED Final   Candida  parapsilosis NOT DETECTED NOT DETECTED Final   Candida tropicalis NOT DETECTED NOT DETECTED Final    Comment: Performed at [facility], [place] [address], [place], [place] [address]  SARS CORONAVIRUS 2 (TAT 6-24 HRS) Nasopharyngeal Nasopharyngeal Swab     Status: None   Collection Time: 03/17/19 12:32 AM   Specimen: Nasopharyngeal Swab  Result Value Ref Range Status   SARS Coronavirus 2 NEGATIVE NEGATIVE Final    Comment: (NOTE) SARS-CoV-2 target nucleic acids are NOT DETECTED. The SARS-CoV-2 RNA is generally detectable in upper and lower respiratory specimens during the acute phase of infection. Negative results do not preclude SARS-CoV-2 infection, do not rule out co-infections with other pathogens, and should not be used as the sole basis for treatment or other patient management decisions. Negative results must be combined with clinical observations, patient history, and epidemiological information. The expected result is Negative. Fact Sheet for Patients: SugarRoll.be Fact Sheet for Healthcare Providers: https://www.woods-mathews.com/ This test is not yet approved or cleared by the [place]  FDA and  has been authorized for detection and/or diagnosis of SARS-CoV-2 by FDA under an Emergency Use Authorization (EUA). This EUA will remain  in effect (meaning this test can be used) for the duration of the COVID-19 declaration under Section 56 4(b)(1) of the Act, 21 U.S.C. section 360bbb-3(b)(1), unless the authorization is terminated or revoked sooner. Performed at [facility], [place] [address], [place], [place] [address]   Urine culture     Status: Abnormal   Collection Time: 03/17/19 12:54 AM   Specimen: Urine, Random  Result Value Ref Range Status   Specimen Description URINE, RANDOM  Final   Special Requests   Final    NONE Performed at [facility], [place] [address] [place], [place] [address]    Culture MULTIPLE SPECIES PRESENT, SUGGEST RECOLLECTION (A)  Final   Report Status 03/18/2019 FINAL  Final  Culture, blood (Routine X 2) w Reflex to ID Panel     Status: None   Collection Time: 03/19/19  4:02 PM   Specimen: BLOOD RIGHT HAND  Result Value Ref Range Status   Specimen Description BLOOD RIGHT HAND  Final   Special Requests   Final    BOTTLES DRAWN AEROBIC AND ANAEROBIC Blood Culture results may not be optimal due to an inadequate volume of blood received in culture bottles   Culture   Final    NO GROWTH 5 DAYS Performed at [facility], [place] [address] [place], [place] [address]    Report Status 03/24/2019 FINAL  Final  Culture, blood (Routine X 2) w Reflex to ID Panel     Status: None   Collection Time: 03/19/19  4:10 PM   Specimen: BLOOD RIGHT HAND  Result Value Ref Range Status   Specimen Description BLOOD RIGHT HAND  Final   Special Requests   Final    BOTTLES DRAWN AEROBIC AND ANAEROBIC Blood Culture results may not be optimal due to an inadequate volume of blood received in culture bottles   Culture   Final    NO GROWTH 5 DAYS Performed at [facility], [place] [address] [place], [place] [address]    Report Status 03/24/2019 FINAL   Final     Radiology Studies:  No results found.   Scheduled Meds:   . furosemide  60 mg Intravenous BID  . metolazone  5 mg Oral Daily  . psyllium  1 packet Oral Daily  . Warfarin -  Pharmacist Dosing Inpatient   Does not apply q1800    Continuous Infusions:   . penicillin g continuous IV infusion 12 Million Units (03/26/19 1157)     LOS: 9 days     Vernell Leep, MD, [place], [facility]. [facility]    To contact the attending provider between 7A-7P or the covering provider during after hours 7P-7A, please log into the web site [email or web address] and access using universal [place] password for that web site. If you do not have the password, please call the hospital operator.  03/26/2019, 5:23 PM

## 2019-03-26 NOTE — Progress Notes (Signed)
Occupational Therapy Treatment Patient Details Name: Frank [name] MRN: [id number] DOB: October 27, 1945 Today's Date: 03/26/2019    History of present illness Pt is a 73 y/o male with PMH of diverticulosis, hemorrhoids, a fib, complete heart block s/p PPM, hypertropic obstructive cardiomyopathy s/p myectomy with mechanical mitral valve replacement and aortic valve repair, CHF, HTN, TIA, LB lymphedema presenting to ED with rectal bleeding, SOB and cough. Found with sepsis from UTI and possible cellulitis of R LE, CHF.   OT comments  Pt making steady progress towards OT goals this session. Session focus on BUE HEP to increase BUE strength for transfers/ functional mobility. Issued pt level 3 theraband with pt requesting to only complete HEP with RUE as LUE was connected to IV. Pt completed therex as indicated below. Pt reports previous R shoulder injury limiting full ROM of RUE. Education provided on PROM/ self ROM therex within pts pain tolerance. DC plan currently remains appropriate, will continue to follow acutely per POC.    Follow Up Recommendations  Home health OT;Supervision/Assistance - 24 hour(inital 24/7 support)    Equipment Recommendations  3 in 1 bedside commode    Recommendations for Other Services      Precautions / Restrictions Precautions Precautions: Fall Precaution Comments: watch BP Restrictions Weight Bearing Restrictions: No       Mobility Bed Mobility               General bed mobility comments: declined movement in bed  Transfers                 General transfer comment: did not attempt    Balance                                           ADL either performed or assessed with clinical judgement   ADL                                         General ADL Comments: pt declined OOB activity or ADL participation d/t pain. session focus on BUE HEP to increase BUE strength for safe transfers     Vision    Vision Assessment?: No apparent visual deficits   Perception     Praxis      Cognition Arousal/Alertness: Awake/alert Behavior During Therapy: WFL for tasks assessed/performed Overall Cognitive Status: Within Functional Limits for tasks assessed                                          Exercises General Exercises - Upper Extremity Shoulder Flexion: Self ROM;Both;10 reps;Supine Elbow Flexion: AROM;Right;15 reps;Theraband;Supine Theraband Level (Elbow Flexion): Level 3 (Green) Elbow Extension: AROM;Right;10 reps;Supine;Theraband Theraband Level (Elbow Extension): Level 3 (Green) Other Exercises Other Exercises: serratus punches with theraband x10 PROM    Shoulder Instructions       General Comments R LE erythema, edema, and blisters with drainage    Pertinent Vitals/ Pain       Pain Assessment: Faces Faces Pain Scale: Hurts even more Pain Location: RLE and RUE with sh flexion/ tricep extension Pain Descriptors / Indicators: Aching Pain Intervention(s): Limited activity within patient's tolerance;Monitored during session  Home Living  Prior Functioning/Environment              Frequency  Min 2X/week        Progress Toward Goals  OT Goals(current goals can now be found in the care plan section)  Progress towards OT goals: Progressing toward goals  Acute Rehab OT Goals Patient Stated Goal: to get back home  OT Goal Formulation: With patient Time For Goal Achievement: 04/01/19 Potential to Achieve Goals: Good  Plan Discharge plan remains appropriate    Co-evaluation                 AM-PAC OT "6 Clicks" Daily Activity     Outcome Measure     Help from another person taking care of personal grooming?: A Little Help from another person toileting, which includes using toliet, bedpan, or urinal?: A Lot Help from another person bathing (including washing, rinsing, drying)?:  A Little Help from another person to put on and taking off regular upper body clothing?: A Little Help from another person to put on and taking off regular lower body clothing?: A Lot 6 Click Score: 13    End of Session Equipment Utilized During Treatment: Other (comment)(green theraband)  OT Visit Diagnosis: Other abnormalities of gait and mobility (R26.89);Muscle weakness (generalized) (M62.81);Pain Pain - Right/Left: Right Pain - part of body: Leg   Activity Tolerance Patient tolerated treatment well   Patient Left in bed;with call bell/phone within reach;with family/visitor present   Nurse Communication Mobility status        Time: 0814-4818 OT Time Calculation (min): 22 min  Charges: OT General Charges $OT Visit: 1 Visit OT Treatments $Therapeutic Exercise: 8-22 mins  Lanier Clam., COTA/L Acute Rehabilitation Services [phone] [place] 03/26/2019, 1:57 PM

## 2019-03-27 DIAGNOSIS — I50813 Acute on chronic right heart failure: Secondary | ICD-10-CM

## 2019-03-27 DIAGNOSIS — N390 Urinary tract infection, site not specified: Secondary | ICD-10-CM

## 2019-03-27 LAB — COMPREHENSIVE METABOLIC PANEL
ALT: 20 U/L (ref 0–44)
AST: 43 U/L — ABNORMAL HIGH (ref 15–41)
Albumin: 2.5 g/dL — ABNORMAL LOW (ref 3.5–5.0)
Alkaline Phosphatase: 147 U/L — ABNORMAL HIGH (ref 38–126)
Anion gap: 13 (ref 5–15)
BUN: 58 mg/dL — ABNORMAL HIGH (ref 8–23)
CO2: 35 mmol/L — ABNORMAL HIGH (ref 22–32)
Calcium: 8.8 mg/dL — ABNORMAL LOW (ref 8.9–10.3)
Chloride: 88 mmol/L — ABNORMAL LOW (ref 98–111)
Creatinine, Ser: 1.68 mg/dL — ABNORMAL HIGH (ref 0.61–1.24)
GFR calc Af Amer: 47 mL/min — ABNORMAL LOW (ref >60)
GFR calc non Af Amer: 40 mL/min — ABNORMAL LOW (ref >60)
Glucose, Bld: 112 mg/dL — ABNORMAL HIGH (ref 70–99)
Potassium: 3.5 mmol/L (ref 3.5–5.1)
Sodium: 136 mmol/L (ref 135–145)
Total Bilirubin: 1.1 mg/dL (ref 0.3–1.2)
Total Protein: 7.1 g/dL (ref 6.5–8.1)

## 2019-03-27 LAB — CBC
HCT: 26.8 % — ABNORMAL LOW (ref 39.0–52.0)
Hemoglobin: 8.4 g/dL — ABNORMAL LOW (ref 13.0–17.0)
MCH: 30.5 pg (ref 26.0–34.0)
MCHC: 31.3 g/dL (ref 30.0–36.0)
MCV: 97.5 fL (ref 80.0–100.0)
Platelets: 337 10*3/uL (ref 150–400)
RBC: 2.75 MIL/uL — ABNORMAL LOW (ref 4.22–5.81)
RDW: 17.2 % — ABNORMAL HIGH (ref 11.5–15.5)
WBC: 10.1 10*3/uL (ref 4.0–10.5)
nRBC: 0.4 % — ABNORMAL HIGH (ref 0.0–0.2)

## 2019-03-27 LAB — PROTIME-INR
INR: 4.5 (ref 0.8–1.2)
Prothrombin Time: 42.8 seconds — ABNORMAL HIGH (ref 11.4–15.2)

## 2019-03-27 MED ORDER — POTASSIUM CHLORIDE CRYS ER 20 MEQ PO TBCR
40.0000 meq | EXTENDED_RELEASE_TABLET | Freq: Every day | ORAL | Status: DC
  Administered 2019-03-27 – 2019-03-28 (×2): 40 meq via ORAL
  Filled 2019-03-27 (×2): qty 2

## 2019-03-27 MED ORDER — ASCORBIC ACID 500 MG PO TABS
2000.0000 mg | ORAL_TABLET | Freq: Every day | ORAL | Status: DC
  Administered 2019-03-27 – 2019-03-31 (×5): 2000 mg via ORAL
  Filled 2019-03-27 (×5): qty 4

## 2019-03-27 MED ORDER — OMEGA-3-ACID ETHYL ESTERS 1 G PO CAPS
1.0000 g | ORAL_CAPSULE | Freq: Every day | ORAL | Status: DC
  Administered 2019-03-28 – 2019-03-31 (×4): 1 g via ORAL
  Filled 2019-03-27 (×4): qty 1

## 2019-03-27 MED ORDER — ADULT MULTIVITAMIN W/MINERALS CH
1.0000 | ORAL_TABLET | Freq: Every day | ORAL | Status: DC
  Administered 2019-03-27 – 2019-03-31 (×5): 1 via ORAL
  Filled 2019-03-27 (×5): qty 1

## 2019-03-27 MED ORDER — OMEGA-3 FATTY ACIDS 1000 MG PO CAPS: 4.0000 g | ORAL_CAPSULE | Freq: Every morning | ORAL | Status: DC

## 2019-03-27 MED ORDER — HYDROMORPHONE HCL 1 MG/ML IJ SOLN
0.5000 mg | INTRAMUSCULAR | Status: DC | PRN
  Administered 2019-03-28: 10:00:00 0.5 mg via INTRAVENOUS
  Filled 2019-03-27: qty 1

## 2019-03-27 MED ORDER — FERROUS SULFATE 325 (65 FE) MG PO TABS
325.0000 mg | ORAL_TABLET | Freq: Every day | ORAL | Status: DC
  Administered 2019-03-28 – 2019-03-31 (×4): 325 mg via ORAL
  Filled 2019-03-27 (×4): qty 1

## 2019-03-27 MED ORDER — PANTOPRAZOLE SODIUM 40 MG PO TBEC
40.0000 mg | DELAYED_RELEASE_TABLET | Freq: Every day | ORAL | Status: DC
  Administered 2019-03-27 – 2019-03-31 (×5): 40 mg via ORAL
  Filled 2019-03-27 (×5): qty 1

## 2019-03-27 MED ORDER — TORSEMIDE 20 MG PO TABS
60.0000 mg | ORAL_TABLET | Freq: Two times a day (BID) | ORAL | Status: DC
  Administered 2019-03-27 – 2019-03-28 (×2): 60 mg via ORAL
  Filled 2019-03-27 (×3): qty 3

## 2019-03-27 MED ORDER — ASPIRIN EC 81 MG PO TBEC
81.0000 mg | DELAYED_RELEASE_TABLET | Freq: Every day | ORAL | Status: DC
  Administered 2019-03-27 – 2019-03-31 (×5): 81 mg via ORAL
  Filled 2019-03-27 (×5): qty 1

## 2019-03-27 NOTE — Consult Note (Signed)
[facility] Nurse Consult Note: Patient receiving care in [place]. Consult completed remotely after review of record, including images of RLE. Reason for Consult: Care of RLE Wound type: Group G Strep bacteremia Wound bed: See images from multiple days of hospital care Drainage (amount, consistency, odor)  yellow Periwound:erythematous, peeling Dressing procedure/placement/frequency:  Place as many Xeroform gauzes Kellie Simmering [phone]) as necessary to cover EVERY bulla (blister) RUPTURED, OR INTACT.  Then place ABD pads over the areas. Beginning behind the toes, spiral wrap Kerlex to just below the knee.  Then spiral wrap as many 4 or 6 inch wide Ace wraps as necessary to cover from behind the toes to below the knee. Change each shift. Monitor the wound area(s) for worsening of condition such as: Signs/symptoms of infection,  Increase in size,  Development of or worsening of odor, Development of pain, or increased pain at the affected locations.  Notify the medical team if any of these develop.  Thank you for the consult.  [place] nurse will not follow at this time.  Please re-consult the [place] team if needed.  Val Riles, RN, MSN, CWOCN, CNS-BC, pager [phone]

## 2019-03-27 NOTE — Progress Notes (Signed)
Patient ID: Frank [name], male   DOB: [birth year], 73 y.o.   MRN: [id number]   Request for tunneled central catheter placement  Sepsis due to Strept G bacteremia and severe RLE cellulitis Underlying chronic lymphedema  On chronic coumadin for cardiac issues/maechanical valves  INR today 4.5  Discussed with Dr Kathlene Cote Rec: either non tunneled line for now Or Hold tunneled line until INR is 2.0 or lower  Will wait to hear from MD Will follow chart

## 2019-03-27 NOTE — Progress Notes (Addendum)
PROGRESS NOTE   Frank [name]  VOJ:[id number]    DOB: 09/18/1945    DOA: 03/16/2019  PCP: [name], MD   I have briefly reviewed patients previous medical records in [facility].  Chief Complaint:   Chief Complaint  Patient presents with  . 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  In ED febrile to 103 F, hypoxic to 86% on room air, leukocytosis, elevated lactate and creatinine.  Admitted for sepsis due to group G strep bacteremia from severe right lower extremity cellulitis.  Hospital course complicated by decompensated combined CHF, Cardiology consulted.   Assessment & Plan:  Principal Problem:   UTI (urinary tract infection) Active Problems:   MORBID OBESITY   S/P aortic valve repair   S/P mitral valve replacement-Mechanical   Chronic combined systolic and diastolic heart failure (HCC)   Complete heart block (HCC)   Rectal bleeding   Sepsis (HCC)   Acute respiratory failure with hypoxia (HCC)   Acute exacerbation of CHF (congestive heart failure) (HCC)   AKI (acute kidney injury) (HCC)   Group G streptococcal infection   Bacteremia   Hypotension   Bullae   Sepsis due to group G strep bacteremia from right lower extremity nonpurulent cellulitis complicating underlying chronic edema/lymphedema  Patient has large bullae complicating his cellulitis and edema, the one on his right calf had ruptured.  Elevating right lower extremity.  [place] consultation appreciated and right lower extremity in Ace wrap.  As per prior hospitalist discussion with ID, continue IV  penicillin G continuous x14 days (end date 04/01/2019).  Alternate option would be IV ceftriaxone 2 g daily.   Will need long-term IV access by IR.  IR on board but procedure on hold due to elevated INR/4.5.  Although initially sepsis felt to be due to possible UTI, patient asymptomatic of UTI symptoms and no bacteriuria.  Surveillance blood cultures from 12/9: Negative, final report.  Patient requesting round-the-clock IV/p.o. opioids.  Discussed at length with him that we need to start reducing IV Dilaudid use and try to control pain with oral medications as we progress towards discharge.  Acute on chronic combined systolic and diastolic CHF/right-sided CHF  Cardiology following.  I discussed with Dr. Johnsie Cancel on 12/17 and they will follow along from a distance.  Right-sided CHF likely related to OSA/OHS.  As per cardiology, continue IV Lasix 60 mg twice daily and metolazone 5 mg daily which was started on 12/14.  Clinically much improved.  -7 L since admission.  As discussed with Dr. Johnsie Cancel, will transition to Demadex 60 mg twice daily, metolazone 5 mg daily.  Continue to hold Aldactone due to renal insufficiency.  Replace potassium as needed.  At discharge may consider reducing metolazone to 5 mg every other day.  He will arrange outpatient cardiology follow-up.  Acute kidney injury complicating stage III chronic kidney disease/hyperkalemia  Possibly due to hypotension from sepsis/bacteremia.  Peak creatinine 3.65, now back to baseline 1.2-1.6.  Creatinine 1.7 today.  Holding home losartan.  Follow BMP closely while on aggressive IV/p.o. diuretics diuretics.  Acute hypoxic respiratory failure  Suspect from decompensated CHF complicating underlying OSA/OHS.  Does have some hypoxia overnight, again concerning for  OSA.  Nocturnal pulse oximetry.  Hypotension  Suspected due to sepsis/bacteremia  Resolved.  Mechanical mitral and aortic valve  Supratherapeutic INR on admission  requiring vitamin K.  TTE and TEE negative for prosthetic valve endocarditis.  INR slightly supratherapeutic at 4.5, goal 2.5-3.5.  Coumadin per pharmacy.  Anemia secondary to iron deficiency  Hemoglobin stable in the low 8 g range.  Consider iron supplements at discharge.  Paroxysmal atrial fibrillation, complete heart block s/p permanent pacemaker  Followed by Dr. [name], [place] cardiology as outpatient.  Chronic right lower extremity edema  Venous Doppler negative for DVT.  Rest management as above.  Rectal bleeding  Suspected due to hemorrhoids in the setting of supratherapeutic INR.  Seems to have resolved.  Continue to monitor.  Hypervolemic hyponatremia  Stable in the low 130s.  Body mass index is 46.98 kg/m./Morbid obesity  Consider outpatient evaluation for OSA.  Hypokalemia  Better.  Continue to replace and follow.   DVT prophylaxis: Anticoagulated on warfarin Code Status: Full Family Communication: None at bedside. I discussed with patient's spouse, updated care and answered questions. States that the leg wrapping has really helped him, able to stand and move leg better. Disposition: DC home pending clinical improvement, central line placement by IR, better mobilization, adequate pain control on oral medications only.   Consultants:   Cardiology-signed off 12/17 ID  Procedures:   TTE 12/9 TEE 12/11 Lower extremity venous duplex 12/7  Antimicrobials:   IV penicillin G continuous   Subjective:  Patient reports that his right leg pain and infection findings are much improved.  Still reports ongoing pain in that leg and states that is the limiting factor for participation with therapies.  No chest pain or dyspnea reported.  Objective:   Vitals:   03/26/19 2012 03/27/19 0008 03/27/19 0456 03/27/19 0734  BP: (!) 119/50 130/60 (!) 117/49 (!) 124/55  Pulse: 61 65 64 67  Resp: 19 16 11 15   Temp: 98.8 F (37.1 C) 97.8 F (36.6 C) (!) 97.5 F (36.4 C)  98.1 F (36.7 C)  TempSrc: Oral Oral Oral Oral  SpO2: 93% 96% 97% 92%  Weight:   (!) 152.8 kg   Height:        General exam: Elderly male, moderately built and morbidly obese lying comfortably propped up in bed without distress. Respiratory system: Clear to auscultation.  No increased work of breathing. Cardiovascular system: S1 & S2 heard, RRR. No JVD, murmurs, rubs, gallops or clicks.  Trace left leg edema.  Right leg now in Ace wrap.  Telemetry personally reviewed: AV paced rhythm. Gastrointestinal system: Abdomen is nondistended, soft and nontender. No organomegaly or masses felt. Normal bowel sounds heard. Central nervous system: Alert and oriented. No focal neurological deficits. Extremities: Symmetric 5 x 5 power. Skin: Right leg now in Ace wrap per [place] team. Psychiatry: Judgement and insight appear normal. Mood & affect appropriate.     Data Reviewed:   I have personally reviewed following labs and imaging studies   CBC: Recent Labs  Lab 03/25/19 0230 03/26/19 0334 03/27/19 0339  WBC 9.4 10.3 10.1  HGB 8.5* 8.2* 8.4*  HCT 25.9* 26.0* 26.8*  MCV 96.3 96.7 97.5  PLT 293 304 938    Basic Metabolic Panel: Recent Labs  Lab 03/25/19 0230 03/26/19 0334 03/27/19 0339  NA 133* 133* 136  K 3.6 3.3* 3.5  CL 92* 88* 88*  CO2 30 30 35*  GLUCOSE 112* 105* 112*  BUN 64* 59* 58*  CREATININE 1.63*  1.53* 1.68*  CALCIUM 8.8* 8.6* 8.8*    Liver Function Tests: Recent Labs  Lab 03/27/19 0339  AST 43*  ALT 20  ALKPHOS 147*  BILITOT 1.1  PROT 7.1  ALBUMIN 2.5*    CBG: No results for input(s): GLUCAP in the last 168 hours.  Microbiology Studies:   Recent Results (from the past 240 hour(s))  Culture, blood (Routine X 2) w Reflex to ID Panel     Status: None   Collection Time: 03/19/19  4:02 PM   Specimen: BLOOD RIGHT HAND  Result Value Ref Range Status   Specimen Description BLOOD RIGHT HAND  Final   Special Requests   Final    BOTTLES DRAWN AEROBIC AND  ANAEROBIC Blood Culture results may not be optimal due to an inadequate volume of blood received in culture bottles   Culture   Final    NO GROWTH 5 DAYS Performed at [facility], [place] [address] [place], [place] [address]    Report Status 03/24/2019 FINAL  Final  Culture, blood (Routine X 2) w Reflex to ID Panel     Status: None   Collection Time: 03/19/19  4:10 PM   Specimen: BLOOD RIGHT HAND  Result Value Ref Range Status   Specimen Description BLOOD RIGHT HAND  Final   Special Requests   Final    BOTTLES DRAWN AEROBIC AND ANAEROBIC Blood Culture results may not be optimal due to an inadequate volume of blood received in culture bottles   Culture   Final    NO GROWTH 5 DAYS Performed at [facility], [place] [address], [place], [place] [address]    Report Status 03/24/2019 FINAL  Final     Radiology Studies:  No results found.   Scheduled Meds:   . furosemide  60 mg Intravenous BID  . metolazone  5 mg Oral Daily  . psyllium  1 packet Oral Daily  . Warfarin - Pharmacist Dosing Inpatient   Does not apply q1800    Continuous Infusions:   . penicillin g continuous IV infusion 12 Million Units (03/27/19 1219)     LOS: 10 days     Vernell Leep, MD, [place], [facility]. [facility]    To contact the attending provider between 7A-7P or the covering provider during after hours 7P-7A, please log into the web site [email or web address] and access using universal [place] password for that web site. If you do not have the password, please call the hospital operator.  03/27/2019, 3:32 PM

## 2019-03-27 NOTE — Progress Notes (Signed)
ANTICOAGULATION CONSULT NOTE - Follow Up Consult  Pharmacy Consult for Coumadin Indication: MVR and afib.  Allergies  Allergen Reactions  . Warfarin And Related Other (See Comments)    COUMADIN or JANTOVEN BRAND ONLY** Per pt, was switched to generic in 2001 and had significantly decreased absorption > INR subtherapeutic> pt suffered a TIA and has been on brand name only since     Patient Measurements: Height: 5\' 11"  (180.3 cm) Weight: (!) 336 lb 13.8 oz (152.8 kg) IBW/kg (Calculated) : 75.3  Vital Signs: Temp: 98.1 F (36.7 C) (12/17 0734) Temp Source: Oral (12/17 0734) BP: 124/55 (12/17 0734) Pulse Rate: 67 (12/17 0734)  Labs: Recent Labs    03/25/19 0230 03/26/19 0334 03/27/19 0339  HGB 8.5* 8.2* 8.4*  HCT 25.9* 26.0* 26.8*  PLT 293 304 337  LABPROT 35.6* 42.3* 42.8*  INR 3.6* 4.4* 4.5*  CREATININE 1.63* 1.53* 1.68*    Estimated Creatinine Clearance: 60.6 mL/min (A) (by C-G formula based on SCr of 1.68 mg/dL (H)).   Assessment:  Anticoag: Coumadin for mech MVR and Afib.  Chronic intermittent rectal/hemorrhoidal bleeding. INR 3.1>3.6>4.4 today. Hgb 8.4 stable. Plts WNL. - Home warfarin: 15mg  sun/wed, 10mg  AOD Use COUMADIN or JANTOVEN BRAND ONLY (See note for more detail)  - 12/12 Coumadin 10 mg administration not shown in MAR, but was given as reported by nurse  Goal of Therapy:  INR 2.5-3.5 Monitor platelets by anticoagulation protocol: Yes   Plan:  Continue to hold Coumadin today.  Suspect antibiotics elevating INR? Daily INR.  Marguerite Olea, [facility] Clinical Pharmacist Phone [phone]  03/27/2019 7:42 AM

## 2019-03-27 NOTE — Care Management Important Message (Signed)
Important Message  Patient Details  Name: Frank [name] MRN: [id number] Date of Birth: 11-May-1945   Medicare Important Message Given:  Yes     Shelda Altes 03/27/2019, 1:01 PM

## 2019-03-27 NOTE — Progress Notes (Signed)
    Subjective:  Wants Dilaudid No cardiac complaints   Objective:  Vitals:   03/26/19 2012 03/27/19 0008 03/27/19 0456 03/27/19 0734  BP: (!) 119/50 130/60 (!) 117/49 (!) 124/55  Pulse: 61 65 64 67  Resp: 19 16 11 15   Temp: 98.8 F (37.1 C) 97.8 F (36.6 C) (!) 97.5 F (36.4 C) 98.1 F (36.7 C)  TempSrc: Oral Oral Oral Oral  SpO2: 93% 96% 97% 92%  Weight:   (!) 152.8 kg   Height:        Intake/Output from previous day:  Intake/Output Summary (Last 24 hours) at 03/27/2019 0813 Last data filed at 03/27/2019 3546 Gross per 24 hour  Intake 1509.13 ml  Output 2100 ml  Net -590.87 ml    Physical Exam: Affect appropriate Obese white male  HEENT: normal Neck supple with no adenopathy JVP normal no bruits no thyromegaly Lungs clear with no wheezing and good diaphragmatic motion Heart:  S1 click /S2 SEM AV murmur, no rub, gallop or click PMI normal AICD under left clavicle  Abdomen: benighn, BS positve, no tenderness, no AAA no bruit.  No HSM or HJR Condom catheter in place  Distal pulses intact with no bruits Plus 3 RLE edema with bullous cellulitis and ichthyosis  Neuro non-focal Skin warm and dry No muscular weakness   Lab Results: Basic Metabolic Panel: Recent Labs    03/26/19 0334 03/27/19 0339  NA 133* 136  K 3.3* 3.5  CL 88* 88*  CO2 30 35*  GLUCOSE 105* 112*  BUN 59* 58*  CREATININE 1.53* 1.68*  CALCIUM 8.6* 8.8*   Liver Function Tests: Recent Labs    03/27/19 0339  AST 43*  ALT 20  ALKPHOS 147*  BILITOT 1.1  PROT 7.1  ALBUMIN 2.5*   No results for input(s): LIPASE, AMYLASE in the last 72 hours. CBC: Recent Labs    03/26/19 0334 03/27/19 0339  WBC 10.3 10.1  HGB 8.2* 8.4*  HCT 26.0* 26.8*  MCV 96.7 97.5  PLT 304 337    Imaging: No results found.  Cardiac Studies:  ECG: V pacing stable    Telemetry: V pacing likely underlying afib   Echo: TEE 12/11 normal functioning MVR mild AS across repaired AV RV failure no SBE    Medications:   . furosemide  60 mg Intravenous BID  . metolazone  5 mg Oral Daily  . psyllium  1 packet Oral Daily  . Warfarin - Pharmacist Dosing Inpatient   Does not apply q1800     . penicillin g continuous IV infusion 12 Million Units (03/26/19 2359)    Assessment/Plan:   Right sided CHF:  Likely related to OSA/ obesity hypoventilation and multiple leads inserted and removed across TV.zaroxyln started 12/14  continue lasix iv bid With chronic changes , lymphedema and cellulitis will take another 2 weeks to get  To baseline Cellulitis and edema much improved over last 72 hours Wound care Nurse recommendations not followed yet   MVR:  No SBE on TEE INR Rx to high continue coumadin INR Rx   CRT/PPM:  Normal function per EP   Cellulitis:  On iv PCN per primary service   Cardiology will sign off   Jenkins Rouge 03/27/2019, 8:13 AM

## 2019-03-28 LAB — CBC
HCT: 26.6 % — ABNORMAL LOW (ref 39.0–52.0)
Hemoglobin: 8.3 g/dL — ABNORMAL LOW (ref 13.0–17.0)
MCH: 30.5 pg (ref 26.0–34.0)
MCHC: 31.2 g/dL (ref 30.0–36.0)
MCV: 97.8 fL (ref 80.0–100.0)
Platelets: 337 10*3/uL (ref 150–400)
RBC: 2.72 MIL/uL — ABNORMAL LOW (ref 4.22–5.81)
RDW: 17.2 % — ABNORMAL HIGH (ref 11.5–15.5)
WBC: 9.6 10*3/uL (ref 4.0–10.5)
nRBC: 0.3 % — ABNORMAL HIGH (ref 0.0–0.2)

## 2019-03-28 LAB — BASIC METABOLIC PANEL
Anion gap: 16 — ABNORMAL HIGH (ref 5–15)
BUN: 62 mg/dL — ABNORMAL HIGH (ref 8–23)
CO2: 32 mmol/L (ref 22–32)
Calcium: 8.5 mg/dL — ABNORMAL LOW (ref 8.9–10.3)
Chloride: 86 mmol/L — ABNORMAL LOW (ref 98–111)
Creatinine, Ser: 1.77 mg/dL — ABNORMAL HIGH (ref 0.61–1.24)
GFR calc Af Amer: 44 mL/min — ABNORMAL LOW (ref >60)
GFR calc non Af Amer: 38 mL/min — ABNORMAL LOW (ref >60)
Glucose, Bld: 90 mg/dL (ref 70–99)
Potassium: 3.2 mmol/L — ABNORMAL LOW (ref 3.5–5.1)
Sodium: 134 mmol/L — ABNORMAL LOW (ref 135–145)

## 2019-03-28 LAB — PROTIME-INR
INR: 3.9 — ABNORMAL HIGH (ref 0.8–1.2)
Prothrombin Time: 38.5 seconds — ABNORMAL HIGH (ref 11.4–15.2)

## 2019-03-28 LAB — MAGNESIUM: Magnesium: 2.2 mg/dL (ref 1.7–2.4)

## 2019-03-28 MED ORDER — POTASSIUM CHLORIDE CRYS ER 20 MEQ PO TBCR
40.0000 meq | EXTENDED_RELEASE_TABLET | Freq: Once | ORAL | Status: AC
  Administered 2019-03-28: 18:00:00 40 meq via ORAL
  Filled 2019-03-28: qty 2

## 2019-03-28 MED ORDER — WARFARIN SODIUM 5 MG PO TABS
5.0000 mg | ORAL_TABLET | Freq: Once | ORAL | Status: AC
  Administered 2019-03-28: 5 mg via ORAL
  Filled 2019-03-28: qty 1

## 2019-03-28 NOTE — Progress Notes (Signed)
Patient refused to be weigh at this time. Due to mouth is still bleeding. He has cont. To hold  pressure off and on for the last hour.

## 2019-03-28 NOTE — Progress Notes (Signed)
Physical Therapy Treatment Patient Details Name: OSUALDO HANSELL MRN: [id number] DOB: 24-Nov-1945 Today's Date: 03/28/2019    History of Present Illness Pt is a 73 y/o male with PMH of diverticulosis, hemorrhoids, a fib, complete heart block s/p PPM, hypertropic obstructive cardiomyopathy s/p myectomy with mechanical mitral valve replacement and aortic valve repair, CHF, HTN, TIA, LB lymphedema presenting to ED with rectal bleeding, SOB and cough. Found with sepsis from UTI and possible cellulitis of R LE, CHF.    PT Comments    Patient received sitting edge of bed with wife present. Reports pain in right LE improved. Motivated to go home and improve mobility. Patient able to stand with min guard and bed elevated. Able to march in place x 20 reps, then ambulated 3 feet forward and 3 feet backward, 2 side steps along edge of bed with rolling walker and min guard. Improved ability with mobility this visit. Patient will continue to benefit from skilled PT while here to improve functional independence and safety with mobility.      Follow Up Recommendations  Home health PT;Supervision - Intermittent     Equipment Recommendations  Rolling walker with 5" wheels    Recommendations for Other Services  HHPT     Precautions / Restrictions Precautions Precautions: Fall Restrictions Weight Bearing Restrictions: No    Mobility  Bed Mobility               General bed mobility comments: patient received sitting up on side of bed.  Transfers Overall transfer level: Needs assistance Equipment used: Rolling walker (2 wheeled) Transfers: Sit to/from Stand Sit to Stand: From elevated surface;Min guard            Ambulation/Gait Ambulation/Gait assistance: Min guard Gait Distance (Feet): 6 Feet Assistive device: Rolling walker (2 wheeled) Gait Pattern/deviations: Step-through pattern;Decreased stride length;Trunk flexed;Decreased weight shift to right Gait velocity: reduced    General Gait Details: able to ambulate with min guard, cues to push through RW when WB on right LE, Cues for upright posture.   Stairs             Wheelchair Mobility    Modified Rankin (Stroke Patients Only)       Balance Overall balance assessment: Needs assistance Sitting-balance support: Feet supported Sitting balance-Leahy Scale: Good Sitting balance - Comments: supervision   Standing balance support: Bilateral upper extremity supported;During functional activity Standing balance-Leahy Scale: Fair Standing balance comment: reliant on BUE support                            Cognition Arousal/Alertness: Awake/alert Behavior During Therapy: WFL for tasks assessed/performed Overall Cognitive Status: Within Functional Limits for tasks assessed                                 General Comments: very talkative      Exercises      General Comments        Pertinent Vitals/Pain Pain Assessment: Faces Faces Pain Scale: Hurts a little bit Pain Location: R LE Pain Descriptors / Indicators: Sore Pain Intervention(s): Premedicated before session    Home Living                      Prior Function            PT Goals (current goals can now be found in the care plan  section) Acute Rehab PT Goals Patient Stated Goal: to get back home  PT Goal Formulation: With patient Time For Goal Achievement: 04/01/19 Potential to Achieve Goals: Good Progress towards PT goals: Progressing toward goals    Frequency    Min 3X/week      PT Plan Current plan remains appropriate    Co-evaluation              AM-PAC PT "6 Clicks" Mobility   Outcome Measure  Help needed turning from your back to your side while in a flat bed without using bedrails?: A Little Help needed moving from lying on your back to sitting on the side of a flat bed without using bedrails?: A Little Help needed moving to and from a bed to a chair (including a  wheelchair)?: A Little Help needed standing up from a chair using your arms (e.g., wheelchair or bedside chair)?: A Little Help needed to walk in hospital room?: A Little Help needed climbing 3-5 steps with a railing? : A Lot 6 Click Score: 17    End of Session Equipment Utilized During Treatment: Gait belt Activity Tolerance: Patient tolerated treatment well Patient left: Other (comment);with family/visitor present(seated on side of bed) Nurse Communication: Mobility status PT Visit Diagnosis: Muscle weakness (generalized) (M62.81);Difficulty in walking, not elsewhere classified (R26.2);Pain Pain - Right/Left: Right Pain - part of body: Leg     Time: 1050-1116 PT Time Calculation (min) (ACUTE ONLY): 26 min  Charges:  $Gait Training: 8-22 mins $Therapeutic Exercise: 8-22 mins                     Marifer Hurd, PT, GCS 03/28/19,12:49 PM

## 2019-03-28 NOTE — TOC Progression Note (Signed)
Transition of Care (TOC) - Progression Note  Marvetta Gibbons RN, BSN Transitions of Care Unit 4E- RN Case Manager [phone]   Patient Details  Name: Frank [name] MRN: [id number] Date of Birth: 1945/11/17  Transition of Care [facility]) CM/SW Contact  Dahlia Client, Romeo Rabon, RN Phone Number: 03/28/2019, 4:17 PM  Clinical Narrative:    Noted pt will not be going home with home IV abx, have been unable to place PICC. Call made to [facility] with [facility] to notify of change in plan to go home with po abx. CM also spoke with Malachy Mood at [facility] who is still following for referral on HHRN/PT needs- they can do start of care on Monday 12/21 if pt should d/c over the weekend- if not then can bump SOC next week. (please contact Malachy Mood with [facility] at [phone] when pt is ready for discharge) CM spoke with pt and wife at the bedside to discuss updated transition of care needs. Confirmed choice to remain with [facility] for [facility] needs.    Expected Discharge Plan: [place] Barriers to Discharge: Continued Medical Work up  Expected Discharge Plan and Services Expected Discharge Plan: [place]   Discharge Planning Services: CM Consult Post Acute Care Choice: Home Health, Durable Medical Equipment Living arrangements for the past 2 months: Single Family Home                           HH Arranged: RN, PT [facility] Agency: [place] Date [place]: 03/25/19 Time [place]: 7517 Representative spoke with at [place]: [place] ([place]) Interventions    Readmission Risk Interventions No flowsheet data found.

## 2019-03-28 NOTE — Progress Notes (Addendum)
PROGRESS NOTE   Frank [name]  PZW:[id number]    DOB: Jan 08, 1946    DOA: 03/16/2019  PCP: Frank Ruddy, MD   I have briefly reviewed patients previous medical records in [facility].  Chief Complaint:   Chief Complaint  Patient presents with  . 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  In ED febrile to 103 F, hypoxic to 86% on room air, leukocytosis, elevated lactate and creatinine.  Admitted for sepsis due to group G strep bacteremia from severe right lower extremity cellulitis.  Hospital course complicated by decompensated combined CHF, Cardiology consulted.   Assessment & Plan:  Principal Problem:   UTI (urinary tract infection) Active Problems:   MORBID OBESITY   S/P aortic valve repair   S/P mitral valve replacement-Mechanical   Chronic combined systolic and diastolic heart failure (HCC)   Complete heart block (HCC)   Rectal bleeding   Sepsis (HCC)   Acute respiratory failure with hypoxia (HCC)   Acute exacerbation of CHF (congestive heart failure) (HCC)   AKI (acute kidney injury) (HCC)   Group G streptococcal infection   Bacteremia   Hypotension   Bullae   Sepsis due to group G strep bacteremia from right lower extremity nonpurulent cellulitis complicating underlying chronic edema/lymphedema  Patient has large bullae complicating his cellulitis and edema, the one on his right calf had ruptured.  Elevating right lower extremity.  [place] consultation appreciated and right lower extremity in Ace wrap.  As per prior hospitalist discussion with ID, continue IV  penicillin G continuous x14 days (end date 04/01/2019).  Alternate option would be IV ceftriaxone 2 g daily.   IR had been consulted to place central line for outpatient IV antibiotics.  IV antibiotics are supposed to end on 12/22 which is just a few more days.  Patient is not likely to discharge home at least for the next 48 hours.  Thereby I discussed in detail with Dr. Tommy Medal, ID on 12/18 and he recommended continuing IV penicillin G while hospitalized and at discharge can complete antibiotic course with oral Zyvox.  Discontinue plans for central line.  Although initially sepsis felt to be due to possible UTI, patient asymptomatic of UTI symptoms and no bacteriuria.  Surveillance blood cultures from 12/9: Negative, final report.  Patient starting to mobilize better and decreasing IV opioid use.  Acute on chronic combined systolic and diastolic CHF/right-sided CHF  Cardiology signed off 12/17.    Right-sided CHF likely related to OSA/OHS.  Initially treated with IV Lasix 60 mg twice daily and metolazone 5 mg daily.  Volume status has significantly improved.  -9 L since admission.  I had discussed with cardiology on 12/17 and patient was transitioned to Demadex 60 mg twice daily, metolazone 5 mg daily.    However today creatinine has gradually creeped up from 1.5-1.7.  Held this morning's dose of torsemide.  Discontinued metolazone.  Resume torsemide from this evening.  Follow BMP closely in a.m.  As discussed with patient and spouse yesterday, they were very concerned about continuing daily metolazone anyway.  He was taking metolazone only every 2 weeks.  Acute kidney injury complicating stage III chronic kidney disease/hyperkalemia  Possibly due  to hypotension from sepsis/bacteremia.  Peak creatinine 3.65, now back to baseline 1.2-1.6.  Creatinine has gradually crept up from 1.52 days ago to 1.77.  Holding home losartan and Aldactone.  Follow BMP closely.  Diuretic management as  above.  DC fluid restriction, patient and spouse indicate that this has not worked for him in the past.  Acute hypoxic respiratory failure  Suspect from decompensated CHF complicating underlying OSA/OHS.  Does have some hypoxia overnight, again concerning for OSA.  Nocturnal pulse oximetry, did not notice any overt desaturation episodes.  Hypotension  Suspected due to sepsis/bacteremia  Resolved.  Mechanical mitral and aortic valve  Supratherapeutic INR on admission requiring vitamin K.  TTE and TEE negative for prosthetic valve endocarditis.  INR has improved to 3.9  Anemia secondary to iron deficiency  Hemoglobin stable in the low 8 g range.  Resumed home iron supplements.  Paroxysmal atrial fibrillation, complete heart block s/p permanent pacemaker  Followed by Dr. [name], [place] as outpatient.  Chronic right lower extremity edema  Venous Doppler negative for DVT.  Rest management as above.  Now with right leg Ace wrap which is helped a lot.  Rectal bleeding  Suspected due to hemorrhoids in the setting of supratherapeutic INR.  Seems to have resolved.  Continue to monitor.  Hypervolemic hyponatremia  Stable in the low 130s.  Body mass index is 46.98 kg/m./Morbid obesity  Consider outpatient evaluation for OSA.  Hypokalemia  Continue to replace and follow BMP.  Check and replace magnesium as needed.  Epistaxis and mouth bleeding, minimal  On 12/18 noted minimal ooze of a drop of blood from left nostril which he states is chronic intermittent for him.  Also noted mild bleeding from anterior upper palate, no clear source visible.  Advised to apply pressure and not to pick nose or mouth or blow nose.  He verbalized understanding.  Monitor closely.  May have prolonged bleeding due to anticoagulation.   DVT prophylaxis: Anticoagulated on warfarin Code Status: Full Family Communication: None at bedside. I discussed with patient's spouse 12/18, updated  care and answered questions. States that the leg wrapping has really helped him, able to stand and move leg better. Disposition: DC home pending clinical improvement, hopefully in the next 2 to 3 days.  Consultants:   Cardiology-signed off 12/17 ID  Procedures:   TTE 12/9 TEE 12/11 Lower extremity venous duplex 12/7  Antimicrobials:   IV penicillin G continuous   Subjective:  Left-sided nostril and mouth bleeding, minimal.  Nosebleeding is chronic and intermittent per patient.  Had some right leg pain this morning and requesting IV pain medicines.  No dyspnea or chest pain reported.  Objective:   Vitals:   03/28/19 0004 03/28/19 0608 03/28/19 0738 03/28/19 1323  BP: 105/87 (!) 133/57 (!) 119/43 135/61  Pulse: 63 64 65 64  Resp: 16 17 18 20   Temp: 98.6 F (37 C) 99.1 F (37.3 C) 98.7 F (37.1 C) 98 F (36.7 C)  TempSrc: Oral Axillary Oral Oral  SpO2: 95% 91% 91% 94%  Weight:      Height:        General exam: Elderly male, moderately built and morbidly obese sitting up comfortably at edge of bed. Respiratory system: Clear to auscultation.  No increased work of breathing. Cardiovascular system: S1 & S2 heard, RRR. No JVD, murmurs, rubs, gallops or clicks.  Trace left leg edema.  Right leg now in Ace wrap.  Telemetry personally reviewed: AV paced rhythm.  14 beat NSVT.  Gastrointestinal system: Abdomen is nondistended, soft and nontender. No organomegaly or masses felt. Normal bowel sounds heard. Central nervous system: Alert and oriented. No focal neurological deficits. Extremities: Symmetric 5 x 5 power. Skin: Right leg now in Ace wrap per [place] team. Psychiatry: Judgement and insight appear normal. Mood & affect appropriate.  HEENT: A drop of blood at the tip of the left nostril.  Mild blood roof of mouth, unable to see source.    Data Reviewed:   I have personally reviewed following labs and imaging studies   CBC: Recent Labs  Lab 03/26/19 0334 03/27/19 0339  03/28/19 0335  WBC 10.3 10.1 9.6  HGB 8.2* 8.4* 8.3*  HCT 26.0* 26.8* 26.6*  MCV 96.7 97.5 97.8  PLT 304 337 474    Basic Metabolic Panel: Recent Labs  Lab 03/26/19 0334 03/27/19 0339 03/28/19 0335  NA 133* 136 134*  K 3.3* 3.5 3.2*  CL 88* 88* 86*  CO2 30 35* 32  GLUCOSE 105* 112* 90  BUN 59* 58* 62*  CREATININE 1.53* 1.68* 1.77*  CALCIUM 8.6* 8.8* 8.5*    Liver Function Tests: Recent Labs  Lab 03/27/19 0339  AST 43*  ALT 20  ALKPHOS 147*  BILITOT 1.1  PROT 7.1  ALBUMIN 2.5*    CBG: No results for input(s): GLUCAP in the last 168 hours.  Microbiology Studies:   Recent Results (from the past 240 hour(s))  Culture, blood (Routine X 2) w Reflex to ID Panel     Status: None   Collection Time: 03/19/19  4:02 PM   Specimen: BLOOD RIGHT HAND  Result Value Ref Range Status   Specimen Description BLOOD RIGHT HAND  Final   Special Requests   Final    BOTTLES DRAWN AEROBIC AND ANAEROBIC Blood Culture results may not be optimal due to an inadequate volume of blood received in culture bottles   Culture   Final    NO GROWTH 5 DAYS Performed at [facility], [place] [address] [place], [place] [address]    Report Status 03/24/2019 FINAL  Final  Culture, blood (Routine X 2) w Reflex to ID Panel     Status: None   Collection Time: 03/19/19  4:10 PM   Specimen: BLOOD RIGHT HAND  Result Value Ref Range Status   Specimen Description BLOOD RIGHT HAND  Final   Special Requests   Final    BOTTLES DRAWN AEROBIC AND ANAEROBIC Blood Culture results may not be optimal due to an inadequate volume of blood received in culture bottles   Culture   Final    NO GROWTH 5 DAYS Performed at [facility], [place] [address] [place], [place] [address]    Report Status 03/24/2019 FINAL  Final     Radiology Studies:  No results found.   Scheduled Meds:   . vitamin C  2,000 mg Oral Daily  . aspirin EC  81 mg Oral Daily  . ferrous sulfate  325 mg Oral Q breakfast  .  multivitamin with minerals  1 tablet Oral Daily  . omega-3 acid ethyl esters  1 g Oral Daily  . pantoprazole  40 mg Oral Daily  . potassium chloride  40 mEq Oral Daily  . potassium chloride  40 mEq Oral Once  . psyllium  1 packet Oral Daily  . torsemide  60 mg Oral BID  . Warfarin - Pharmacist Dosing Inpatient   Does not apply q1800    Continuous Infusions:   . penicillin  g continuous IV infusion 12 Million Units (03/28/19 1252)     LOS: 11 days     Vernell Leep, MD, [place], [facility]. [facility]    To contact the attending provider between 7A-7P or the covering provider during after hours 7P-7A, please log into the web site [email or web address] and access using universal [place] password for that web site. If you do not have the password, please call the hospital operator.  03/28/2019, 3:52 PM

## 2019-03-28 NOTE — Progress Notes (Addendum)
Patient called c/o bleeding from mouth. Found sore in roof of mouth behind front teeth that is bleeding. Had patient applied pressure to mouth for 5 min. INR today 3.9 . Bodenheimer N.P. was paged at 05:37 Cont. To hold pressure and he will discuss with M.D. Marya Amsler in [place] aware and R.N. aware.

## 2019-03-28 NOTE — Progress Notes (Signed)
ANTICOAGULATION CONSULT NOTE - Follow Up Consult  Pharmacy Consult for Coumadin Indication: MVR and afib.  Allergies  Allergen Reactions  . Warfarin And Related Other (See Comments)    COUMADIN or JANTOVEN BRAND ONLY** Per pt, was switched to generic in 2001 and had significantly decreased absorption > INR subtherapeutic> pt suffered a TIA and has been on brand name only since     Patient Measurements: Height: 5\' 11"  (180.3 cm) Weight: (pt refused to be weighed at this time) IBW/kg (Calculated) : 75.3  Vital Signs: Temp: 98 F (36.7 C) (12/18 1323) Temp Source: Oral (12/18 1323) BP: 135/61 (12/18 1323) Pulse Rate: 64 (12/18 1323)  Labs: Recent Labs    03/26/19 0334 03/27/19 0339 03/28/19 0335  HGB 8.2* 8.4* 8.3*  HCT 26.0* 26.8* 26.6*  PLT 304 337 337  LABPROT 42.3* 42.8* 38.5*  INR 4.4* 4.5* 3.9*  CREATININE 1.53* 1.68* 1.77*    Estimated Creatinine Clearance: 57.6 mL/min (A) (by C-G formula based on SCr of 1.77 mg/dL (H)).   Assessment:  Anticoag: Coumadin for mech MVR and Afib.  Chronic intermittent rectal/hemorrhoidal bleeding. INR 3.9 today. Hgb 8.3 stable. Plts WNL. - Home warfarin: 15mg  sun/wed, 10mg  AOD Use COUMADIN or JANTOVEN BRAND ONLY (See note for more detail)  - 12/12 Coumadin 10 mg administration not shown in MAR, but was given as reported by nurse  Goal of Therapy:  INR 2.5-3.5 Monitor platelets by anticoagulation protocol: Yes   Plan:  Will resume small dose of Coumadin tonight since INR likely to continue to fall after holding for a few days. Warfarin 5 mg x 1 tonight. Daily INR.  Marguerite Olea, [facility] Clinical Pharmacist Phone [phone]  03/28/2019 4:16 PM

## 2019-03-29 LAB — CBC
HCT: 25.3 % — ABNORMAL LOW (ref 39.0–52.0)
Hemoglobin: 8.2 g/dL — ABNORMAL LOW (ref 13.0–17.0)
MCH: 30.5 pg (ref 26.0–34.0)
MCHC: 32.4 g/dL (ref 30.0–36.0)
MCV: 94.1 fL (ref 80.0–100.0)
Platelets: 345 10*3/uL (ref 150–400)
RBC: 2.69 MIL/uL — ABNORMAL LOW (ref 4.22–5.81)
RDW: 16.9 % — ABNORMAL HIGH (ref 11.5–15.5)
WBC: 10.7 10*3/uL — ABNORMAL HIGH (ref 4.0–10.5)
nRBC: 0.2 % (ref 0.0–0.2)

## 2019-03-29 LAB — PROTIME-INR
INR: 3.3 — ABNORMAL HIGH (ref 0.8–1.2)
Prothrombin Time: 33.3 seconds — ABNORMAL HIGH (ref 11.4–15.2)

## 2019-03-29 LAB — COMPREHENSIVE METABOLIC PANEL
ALT: 16 U/L (ref 0–44)
AST: 39 U/L (ref 15–41)
Albumin: 2.3 g/dL — ABNORMAL LOW (ref 3.5–5.0)
Alkaline Phosphatase: 141 U/L — ABNORMAL HIGH (ref 38–126)
Anion gap: 14 (ref 5–15)
BUN: 66 mg/dL — ABNORMAL HIGH (ref 8–23)
CO2: 33 mmol/L — ABNORMAL HIGH (ref 22–32)
Calcium: 8.6 mg/dL — ABNORMAL LOW (ref 8.9–10.3)
Chloride: 84 mmol/L — ABNORMAL LOW (ref 98–111)
Creatinine, Ser: 1.87 mg/dL — ABNORMAL HIGH (ref 0.61–1.24)
GFR calc Af Amer: 41 mL/min — ABNORMAL LOW (ref >60)
GFR calc non Af Amer: 35 mL/min — ABNORMAL LOW (ref >60)
Glucose, Bld: 107 mg/dL — ABNORMAL HIGH (ref 70–99)
Potassium: 3.4 mmol/L — ABNORMAL LOW (ref 3.5–5.1)
Sodium: 131 mmol/L — ABNORMAL LOW (ref 135–145)
Total Bilirubin: 1.3 mg/dL — ABNORMAL HIGH (ref 0.3–1.2)
Total Protein: 7 g/dL (ref 6.5–8.1)

## 2019-03-29 MED ORDER — POLYETHYLENE GLYCOL 3350 17 G PO PACK
17.0000 g | PACK | Freq: Two times a day (BID) | ORAL | Status: DC
  Administered 2019-03-29 – 2019-03-31 (×5): 17 g via ORAL
  Filled 2019-03-29 (×5): qty 1

## 2019-03-29 MED ORDER — BISACODYL 5 MG PO TBEC: 10.0000 mg | DELAYED_RELEASE_TABLET | Freq: Every day | ORAL | Status: DC | PRN

## 2019-03-29 MED ORDER — POTASSIUM CHLORIDE CRYS ER 20 MEQ PO TBCR
40.0000 meq | EXTENDED_RELEASE_TABLET | Freq: Two times a day (BID) | ORAL | Status: DC
  Administered 2019-03-29 – 2019-03-31 (×5): 40 meq via ORAL
  Filled 2019-03-29 (×5): qty 2

## 2019-03-29 MED ORDER — WARFARIN SODIUM 5 MG PO TABS
5.0000 mg | ORAL_TABLET | Freq: Once | ORAL | Status: AC
  Administered 2019-03-29: 18:00:00 5 mg via ORAL
  Filled 2019-03-29 (×2): qty 1

## 2019-03-29 MED ORDER — BISACODYL 5 MG PO TBEC
10.0000 mg | DELAYED_RELEASE_TABLET | Freq: Once | ORAL | Status: AC
  Administered 2019-03-29: 07:00:00 10 mg via ORAL
  Filled 2019-03-29: qty 2

## 2019-03-29 MED ORDER — TORSEMIDE 20 MG PO TABS
60.0000 mg | ORAL_TABLET | Freq: Two times a day (BID) | ORAL | Status: DC
  Administered 2019-03-30 – 2019-03-31 (×3): 60 mg via ORAL
  Filled 2019-03-29 (×3): qty 3

## 2019-03-29 MED ORDER — SENNA 8.6 MG PO TABS
2.0000 | ORAL_TABLET | Freq: Two times a day (BID) | ORAL | Status: DC
  Administered 2019-03-29 – 2019-03-31 (×5): 17.2 mg via ORAL
  Filled 2019-03-29 (×5): qty 2

## 2019-03-29 NOTE — Consult Note (Signed)
[place] Nurse Consult Note: Reason for Consult: [place] Nurse is reconsulted for wounds on LEs. He was seen by my partner Lenore Manner on Thursday, 03/27/19. Patient has legs in a dependent position the majority of the day and blisters and edema are not improving. Erythema is receding from the markings slightly with antibiotics.  Weeping continues. Bedside RN Curlene Labrum) and I brainstorm alternate approaches. Patient is unwilling to elevate LEs or even to place in bed. Nursing performs wound care and notes that with LEs dependent, weeping increases and dressings are wet after 2 hours. We explore silver sulfadiazine on ABDs and determine that this would be undesirable as the antimicrobial has no absorption. I have offered to provide a mattress replacement with low air loss feature that could assist with the management of the microclimate, but patient would have to be in bed for this therapy to work. I will order and he can refuse if he is unwilling to accept this approach. I will increase the frequency of dressing changes to the LEs to TID.  [place] nursing team will not follow, but will remain available to this patient, the nursing and medical teams.  Please re-consult if needed. Thanks, Maudie Flakes, MSN, RN, [place], Ar[facility] Abbott  Pager# [phone]

## 2019-03-29 NOTE — Progress Notes (Signed)
ANTICOAGULATION CONSULT NOTE - Follow Up Consult  Pharmacy Consult for Coumadin Indication: MVR and afib.  Allergies  Allergen Reactions  . Warfarin And Related Other (See Comments)    COUMADIN or JANTOVEN BRAND ONLY** Per pt, was switched to generic in 2001 and had significantly decreased absorption > INR subtherapeutic> pt suffered a TIA and has been on brand name only since     Patient Measurements: Height: 5\' 11"  (180.3 cm) Weight: (!) 336 lb 10.3 oz (152.7 kg) IBW/kg (Calculated) : 75.3  Vital Signs: Temp: 98.5 F (36.9 C) (12/19 0815) Temp Source: Oral (12/19 0815) BP: 128/44 (12/19 0815) Pulse Rate: 63 (12/19 0815)  Labs: Recent Labs    03/27/19 0339 03/28/19 0335 03/29/19 0355  HGB 8.4* 8.3* 8.2*  HCT 26.8* 26.6* 25.3*  PLT 337 337 345  LABPROT 42.8* 38.5* 33.3*  INR 4.5* 3.9* 3.3*  CREATININE 1.68* 1.77* 1.87*    Estimated Creatinine Clearance: 54.5 mL/min (A) (by C-G formula based on SCr of 1.87 mg/dL (H)).   Assessment: Coumadin for mech MVR and Afib. Chronic intermittent rectal/hemorrhoidal bleeding. INR 3.3 today within goal after restarting low-dose last night. CBC stable. Per RN, bleeding has improved today. Will continue low dose for tonight. Suspect that patient will need a combination of 10 mg and 5 mg doses.  Home warfarin regimen: 15mg  sun/wed, 10mg  all other days. Use COUMADIN or JANTOVEN BRAND ONLY   Goal of Therapy:  INR 2.5-3.5 Monitor platelets by anticoagulation protocol: Yes   Plan:  Warfarin 5 mg PO x 1 tonight. Daily INR. Monitor bleeding  Richardine Service, PharmD PGY1 Pharmacy Resident Phone: [phone] 03/29/2019  10:22 AM  Please check [email or web address] for unit-specific pharmacy phone numbers.

## 2019-03-29 NOTE — Progress Notes (Signed)
PROGRESS NOTE   Frank [name]  [name]    DOB: [birth year]    DOA: 03/16/2019  PCP: [name], MD   I have briefly reviewed patients previous medical records in [facility].  Chief Complaint:   Chief Complaint  Patient presents with  . 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  In ED febrile to 103 F, hypoxic to 86% on room air, leukocytosis, elevated lactate and creatinine.  Admitted for sepsis due to group G strep bacteremia from severe right lower extremity cellulitis.  Hospital course complicated by decompensated combined CHF, Cardiology consulted.   Assessment & Plan:  Principal Problem:   UTI (urinary tract infection) Active Problems:   MORBID OBESITY   S/P aortic valve repair   S/P mitral valve replacement-Mechanical   Chronic combined systolic and diastolic heart failure (HCC)   Complete heart block (HCC)   Rectal bleeding   Sepsis (HCC)   Acute respiratory failure with hypoxia (HCC)   Acute exacerbation of CHF (congestive heart failure) (HCC)   AKI (acute kidney injury) (HCC)   Group G streptococcal infection   Bacteremia   Hypotension   Bullae   Sepsis due to group G strep bacteremia from right lower extremity nonpurulent cellulitis complicating underlying chronic edema/lymphedema  Patient had large bullae complicating his cellulitis and edema, most of the bullae have now ruptured.  Elevating right lower extremity.  [place] consultation appreciated and right lower extremity in Ace wrap.  Initial plans as per ID input was for IV continuous insulin G for  14 days until 04/01/2019.  IR had been consulted for central line placement for IV antibiotics.  However patient is not medically ready for discharge.  Thereby I discussed in detail with Dr. Tommy Medal, ID on 12/18 and he recommended continuing IV penicillin G while hospitalized and at discharge can complete antibiotic course with oral Zyvox.  Discontinued plans for central line.  Although initially sepsis felt to be due to possible UTI, patient asymptomatic of UTI symptoms and no bacteriuria.  Surveillance blood cultures from 12/9: Negative, final report.  Patient starting to mobilize better and decreasing IV opioid use, has not used much of IV Dilaudid in the last 2 days.  Examined right lower extremity after removing Ace wrap and gradually improving.  Acute on chronic combined systolic and diastolic CHF/right-sided CHF  Cardiology signed off 12/17.    Right-sided CHF likely related to OSA/OHS.  Initially treated with IV Lasix 60 mg twice daily and metolazone 5 mg daily.  Volume status has significantly improved.  -9 L since admission.  I had discussed with cardiology on 12/17 and patient was transitioned to Demadex 60 mg twice daily, metolazone 5 mg daily.    Despite holding yesterday morning's Demadex dose, discontinuing metolazone, creatinine has steadily crept up to 1.87 today.  I discussed in detail with patient and his spouse and they indicate that his left lower extremity edema is at or almost close to baseline (1-2+ edema) and right lower extremity obviously worse due to infection.  Held today's dose of torsemide, reassess BMP in a.m., if stable or improving then resume torsemide.  If creatinine worsening then consider nephrology consultation.  Both are  agreeable.  Also changed diet from heart healthy to 2 g sodium diet.  Acute kidney injury complicating stage III chronic kidney disease/hyperkalemia  Possibly due to hypotension from sepsis/bacteremia.  Peak creatinine 3.65, now  back to baseline 1.2-1.6.  Creatinine has gradually crept up from 1.52 days ago to 1. 87  Holding home losartan and Aldactone.  Management as noted above.  Acute hypoxic respiratory failure  Suspect from decompensated CHF complicating underlying OSA/OHS.  Does have some hypoxia overnight, again concerning for OSA.  Nocturnal pulse oximetry, did not notice any overt desaturation episodes.  Hypotension  Suspected due to sepsis/bacteremia  Resolved.  Mechanical mitral and aortic valve  Supratherapeutic INR on admission requiring vitamin K.  TTE and TEE negative for prosthetic valve endocarditis.  INR has improved to 3.4  Anemia secondary to iron deficiency  Hemoglobin stable in the low 8 g range.  Resumed home iron supplements.  Paroxysmal atrial fibrillation, complete heart block s/p permanent pacemaker  Followed by Dr. [name], [place] cardiology as outpatient.  Chronic bilateral lower extremity edema, right > left.  Venous Doppler negative for DVT.  Rest management as above.  Now with right leg Ace wrap which is helped a lot.  Wonder if he will benefit from some form of compression stocking to his left lower extremity.  Rectal bleeding  Suspected due to hemorrhoids in the setting of supratherapeutic INR.  Seems to have resolved.  Continue to monitor.  Hypervolemic hyponatremia  Stable in the low 130s.  Body mass index is 46.95 kg/m./Morbid obesity  Consider outpatient evaluation for OSA.  Hypokalemia  Continue to replace and follow BMP.  Check and replace magnesium as needed.  Epistaxis and mouth bleeding, minimal  On 12/18 noted minimal ooze of a drop of blood from left nostril which he states is chronic intermittent for him.  Also noted mild bleeding from anterior upper palate, no clear source visible.  Advised to apply pressure and not to pick nose or mouth or blow nose.  He verbalized understanding.  Monitor closely.  May have prolonged bleeding due to  anticoagulation.  Epistaxis has resolved.  Mouth bleed has significantly improved  Constipation  Reports no BM in 5 days.  Declines suppositories or enemas.  Initiated aggressive bowel regimen including MiraLAX and senna.  As needed oral Dulcolax.  Hypoalbuminemia  Dietician consultation   DVT prophylaxis: Anticoagulated on warfarin Code Status: Full Family Communication: None at bedside. I discussed with patient's spouse 12/ 19, updated care and answered questions. States that the leg wrapping has really helped him, able to stand and move leg better. Disposition: DC home pending clinical improvement, hopefully in the next 2 to 3 days  Consultants:   Cardiology-signed off 12/17 ID  Procedures:   TTE 12/9 TEE 12/11 Lower extremity venous duplex 12/7  Antimicrobials:   IV penicillin G continuous   Subjective:  Constipation without BM in 5 days.  Passing flatus.  No abdominal pain, nausea vomiting.  Nosebleed has stopped.  Nosebleed has significantly improved but feels woozy intermittently.  Right leg pain, better than before.  Swelling improved.  Left leg swelling at baseline.  Objective:   Vitals:   03/29/19 0512 03/29/19 0634 03/29/19 0815 03/29/19 1154  BP: (!) 126/52  (!) 128/44 (!) 131/47  Pulse: 65  63 72  Resp: 20  19 19   Temp: 98.4 F (36.9 C)  98.5 F (36.9 C) 98.5 F (36.9 C)  TempSrc: Oral  Oral Oral  SpO2: 94%  94% 95%  Weight:  Marland Kitchen)  152.7 kg    Height:       Patient examined along with his RN in room. General exam: Elderly male, moderately built and morbidly obese sitting up comfortably at edge of bed. Respiratory system: Clear to auscultation.  No increased work of breathing. Cardiovascular system: S1 & S2 heard, RRR. No JVD, murmurs, rubs, gallops or clicks.  1-2+ left leg edema.  Removed right leg Ace wrap, most of the blisters of completely ruptured and resolved.  Significant swelling of dorsum of right foot/edema.  Erythema gradually  decreasing. Gastrointestinal system: Abdomen is nondistended, soft and nontender. No organomegaly or masses felt. Normal bowel sounds heard. Central nervous system: Alert and oriented. No focal neurological deficits. Extremities: Symmetric 5 x 5 power. Skin: Right leg exam as noted above. Psychiatry: Judgement and insight appear normal. Mood & affect appropriate.  HEENT: No external nasal or mouth bleeding noted.    Data Reviewed:   I have personally reviewed following labs and imaging studies   CBC: Recent Labs  Lab 03/27/19 0339 03/28/19 0335 03/29/19 0355  WBC 10.1 9.6 10.7*  HGB 8.4* 8.3* 8.2*  HCT 26.8* 26.6* 25.3*  MCV 97.5 97.8 94.1  PLT 337 337 176    Basic Metabolic Panel: Recent Labs  Lab 03/27/19 0339 03/28/19 0335 03/28/19 1642 03/29/19 0355  NA 136 134*  --  131*  K 3.5 3.2*  --  3.4*  CL 88* 86*  --  84*  CO2 35* 32  --  33*  GLUCOSE 112* 90  --  107*  BUN 58* 62*  --  66*  CREATININE 1.68* 1.77*  --  1.87*  CALCIUM 8.8* 8.5*  --  8.6*  MG  --   --  2.2  --     Liver Function Tests: Recent Labs  Lab 03/27/19 0339 03/29/19 0355  AST 43* 39  ALT 20 16  ALKPHOS 147* 141*  BILITOT 1.1 1.3*  PROT 7.1 7.0  ALBUMIN 2.5* 2.3*    CBG: No results for input(s): GLUCAP in the last 168 hours.  Microbiology Studies:   Recent Results (from the past 240 hour(s))  Culture, blood (Routine X 2) w Reflex to ID Panel     Status: None   Collection Time: 03/19/19  4:02 PM   Specimen: BLOOD RIGHT HAND  Result Value Ref Range Status   Specimen Description BLOOD RIGHT HAND  Final   Special Requests   Final    BOTTLES DRAWN AEROBIC AND ANAEROBIC Blood Culture results may not be optimal due to an inadequate volume of blood received in culture bottles   Culture   Final    NO GROWTH 5 DAYS Performed at [facility], [place] [address], [place], [place] [address]    Report Status 03/24/2019 FINAL  Final  Culture, blood (Routine X 2) w Reflex to ID Panel      Status: None   Collection Time: 03/19/19  4:10 PM   Specimen: BLOOD RIGHT HAND  Result Value Ref Range Status   Specimen Description BLOOD RIGHT HAND  Final   Special Requests   Final    BOTTLES DRAWN AEROBIC AND ANAEROBIC Blood Culture results may not be optimal due to an inadequate volume of blood received in culture bottles   Culture   Final    NO GROWTH 5 DAYS Performed at [facility], [place] [address], [place], [place] [address]    Report Status 03/24/2019 FINAL  Final     Radiology Studies:  No results found.   Scheduled Meds:   . vitamin C  2,000 mg Oral Daily  . aspirin EC  81 mg Oral Daily  . ferrous sulfate  325 mg Oral Q breakfast  . multivitamin with minerals  1 tablet Oral Daily  . omega-3 acid ethyl esters  1 g Oral Daily  . pantoprazole  40 mg Oral Daily  . polyethylene glycol  17 g Oral BID  . potassium chloride  40 mEq Oral BID  . senna  2 tablet Oral BID  . [START ON 03/30/2019] torsemide  60 mg Oral BID  . warfarin  5 mg Oral ONCE-1800  . Warfarin - Pharmacist Dosing Inpatient   Does not apply q1800    Continuous Infusions:   . penicillin g continuous IV infusion 12 Million Units (03/29/19 0016)     LOS: 12 days     Vernell Leep, MD, [place], [facility]. [facility]    To contact the attending provider between 7A-7P or the covering provider during after hours 7P-7A, please log into the web site [email or web address] and access using universal [place] password for that web site. If you do not have the password, please call the hospital operator.  03/29/2019, 1:51 PM

## 2019-03-30 LAB — BASIC METABOLIC PANEL
Anion gap: 16 — ABNORMAL HIGH (ref 5–15)
BUN: 66 mg/dL — ABNORMAL HIGH (ref 8–23)
CO2: 30 mmol/L (ref 22–32)
Calcium: 9 mg/dL (ref 8.9–10.3)
Chloride: 83 mmol/L — ABNORMAL LOW (ref 98–111)
Creatinine, Ser: 1.74 mg/dL — ABNORMAL HIGH (ref 0.61–1.24)
GFR calc Af Amer: 45 mL/min — ABNORMAL LOW (ref >60)
GFR calc non Af Amer: 39 mL/min — ABNORMAL LOW (ref >60)
Glucose, Bld: 126 mg/dL — ABNORMAL HIGH (ref 70–99)
Potassium: 4.1 mmol/L (ref 3.5–5.1)
Sodium: 129 mmol/L — ABNORMAL LOW (ref 135–145)

## 2019-03-30 LAB — PROTIME-INR
INR: 3.3 — ABNORMAL HIGH (ref 0.8–1.2)
Prothrombin Time: 33.1 seconds — ABNORMAL HIGH (ref 11.4–15.2)

## 2019-03-30 LAB — CBC
HCT: 26.9 % — ABNORMAL LOW (ref 39.0–52.0)
Hemoglobin: 8.7 g/dL — ABNORMAL LOW (ref 13.0–17.0)
MCH: 30.2 pg (ref 26.0–34.0)
MCHC: 32.3 g/dL (ref 30.0–36.0)
MCV: 93.4 fL (ref 80.0–100.0)
Platelets: 417 10*3/uL — ABNORMAL HIGH (ref 150–400)
RBC: 2.88 MIL/uL — ABNORMAL LOW (ref 4.22–5.81)
RDW: 17 % — ABNORMAL HIGH (ref 11.5–15.5)
WBC: 11.1 10*3/uL — ABNORMAL HIGH (ref 4.0–10.5)
nRBC: 0.2 % (ref 0.0–0.2)

## 2019-03-30 MED ORDER — WARFARIN SODIUM 5 MG PO TABS
5.0000 mg | ORAL_TABLET | Freq: Once | ORAL | Status: AC
  Administered 2019-03-30: 19:00:00 5 mg via ORAL
  Filled 2019-03-30: qty 1

## 2019-03-30 NOTE — Progress Notes (Signed)
PROGRESS NOTE   Frank [name]  NOM:[id number]    DOB: Jan 26, 1946    DOA: 03/16/2019  PCP: [name], MD   I have briefly reviewed patients previous medical records in [facility].  Chief Complaint:   Chief Complaint  Patient presents with  . 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  In ED febrile to 103 F, hypoxic to 86% on room air, leukocytosis, elevated lactate and creatinine.  Admitted for sepsis due to group G strep bacteremia from severe right lower extremity cellulitis.  Hospital course complicated by decompensated combined CHF, acute on chronic kidney disease.  Overall slowly improving.   Assessment & Plan:  Principal Problem:   UTI (urinary tract infection) Active Problems:   MORBID OBESITY   S/P aortic valve repair   S/P mitral valve replacement-Mechanical   Chronic combined systolic and diastolic heart failure (HCC)   Complete heart block (HCC)   Rectal bleeding   Sepsis (HCC)   Acute respiratory failure with hypoxia (HCC)   Acute exacerbation of CHF (congestive heart failure) (HCC)   AKI (acute kidney injury) (HCC)   Group G streptococcal infection   Bacteremia   Hypotension   Bullae   Sepsis due to group G strep bacteremia from right lower extremity nonpurulent cellulitis complicating underlying chronic edema/lymphedema  Patient had large bullae complicating his cellulitis and edema, most of the bullae have now ruptured.  Elevating right lower extremity.  [place] consultation appreciated and right lower extremity in Ace wrap.  Initial plans as per ID  input was for IV continuous insulin G for 14 days until 04/01/2019.  IR had been consulted for central line placement for IV antibiotics.  However patient is not medically ready for discharge.  Thereby I discussed in detail with Dr. Tommy Medal, ID on 12/18 and he recommended continuing IV penicillin G while hospitalized and at discharge can complete antibiotic course with oral Zyvox.  Discontinued plans for central line.  Although initially sepsis felt to be due to possible UTI, patient asymptomatic of UTI symptoms and no bacteriuria.  Surveillance blood cultures from 12/9: Negative, final report.  Patient starting to mobilize better and decreasing IV opioid use, has not used much of IV Dilaudid (last used 12/18 for a single dose).  Examined right lower extremity after removing Ace wrap on 12/19 and gradually improving.  Acute on chronic combined systolic and diastolic CHF/right-sided CHF  Cardiology signed off 12/17.    Right-sided CHF likely related to OSA/OHS.  Initially treated with IV Lasix 60 mg twice daily and metolazone 5 mg daily.  Volume status has significantly improved.  -11 L since admission  I had discussed with cardiology on 12/17 and patient was transitioned to Demadex 60 mg twice daily, metolazone 5 mg daily.    Despite holding Demadex dose, discontinuing metolazone, creatinine had steadily crept up to 1.87 on 1/19.  I discussed in detail with patient and his spouse and they indicate that his left lower extremity edema is at or almost close to baseline (1-2+ edema) and right lower extremity obviously worse due to infection.  Creatinine back down to 1.74.  Resumed torsemide.  Continue to follow BMP.  Acute kidney injury  complicating stage III chronic kidney disease/hyperkalemia  Possibly due to hypotension from sepsis/bacteremia.  Peak creatinine 3.65, now back to baseline 1.2-1.6.  Creatinine has gradually crept up from 1.52 days ago to 1. 87.  After briefly holding  diuretics, creatinine down to 1.74.  Holding home losartan and Aldactone.  Management as noted above.  Acute hypoxic respiratory failure  Suspect from decompensated CHF complicating underlying OSA/OHS.  Does have some hypoxia overnight, again concerning for OSA.  Nocturnal pulse oximetry, did not notice any overt desaturation episodes.  Hypotension  Suspected due to sepsis/bacteremia  Resolved.  Mechanical mitral and aortic valve  Supratherapeutic INR on admission requiring vitamin K.  TTE and TEE negative for prosthetic valve endocarditis.  INR has improved to 3.3  Anemia secondary to iron deficiency  Hemoglobin stable in the low 8 g range.  Resumed home iron supplements.  Paroxysmal atrial fibrillation, complete heart block s/p permanent pacemaker  Followed by Dr. [name], [place] cardiology as outpatient.  Chronic bilateral lower extremity edema, right > left.  Venous Doppler negative for DVT.  Rest management as above.  Now with right leg Ace wrap which has helped a lot.  Wonder if he will benefit from some form of compression stocking to his left lower extremity.  Obviously worsened in dependent positions.  As per patient and spouse, left lower extremity is back to his usual baseline a few years with edema.  Rectal bleeding  Suspected hemorrhoidal in the context of supratherapeutic INR.  Resolved.  Hypervolemic hyponatremia  Stable in the low 130s.  Body mass index is 46.61 kg/m./Morbid obesity  Consider outpatient evaluation for OSA.  Hypokalemia  Replaced.  Magnesium normal.  Epistaxis and mouth bleeding, minimal  Epistaxis resolved.  Minimal mouth bleeding has significantly improved or may have resolved as well  Constipation  Having small BMs.  Continue aggressive bowel regimen.  Hypoalbuminemia  Dietician consulted and input pending.   DVT prophylaxis: Anticoagulated on warfarin, INR 3.3 on 12/20 Code Status: Full Family Communication: None at  bedside. I discussed with patient's spouse 12/ 19, updated care and answered questions. States that the leg wrapping has really helped him, able to stand and move leg better. Disposition: DC home pending clinical improvement, hopefully in the next 1 to 2 days  Consultants:   Cardiology-signed off 12/17 ID  Procedures:   TTE 12/9 TEE 12/11 Lower extremity venous duplex 12/7  Antimicrobials:   IV penicillin G continuous   Subjective:  Having small BMs.  No recurrence of nosebleed.  Mild bleed may have resolved.  Current left lower extremity swelling at his usual baseline from years.  Right leg pain and swelling much improved compared to admission.  No chest pain or dyspnea.  Objective:   Vitals:   03/29/19 2003 03/30/19 0020 03/30/19 0500 03/30/19 0759  BP: (!) 127/105 (!) 116/46 118/60 (!) 121/43  Pulse: 66 62 86 60  Resp: 20  12 17   Temp: 98.4 F (36.9 C) 98.2 F (36.8 C) 97.6 F (36.4 C) 98.4 F (36.9 C)  TempSrc: Oral Oral Oral Oral  SpO2: 99% 94% 96% 96%  Weight:   (!) 151.6 kg   Height:       General exam: Elderly male, moderately built and morbidly obese sitting up comfortably at edge of bed. Respiratory system: Clear to auscultation.  No increased work of breathing. Cardiovascular system: S1 & S2 heard, RRR. No JVD, murmurs, rubs, gallops or clicks.  1-2+ left leg edema.  Left leg 1+ pitting edema.  Right leg Ace wrapping.  Right dorsal foot with substantial edema. Gastrointestinal system: Abdomen is nondistended, soft and nontender. No organomegaly or masses felt. Normal bowel sounds heard. Central nervous system: Alert and oriented. No focal neurological deficits. Extremities: Symmetric 5 x 5 power. Skin: Right leg exam as noted above. Psychiatry: Judgement and insight appear normal. Mood & affect appropriate.  HEENT: No external nasal or mouth bleeding noted.    Data Reviewed:   I have personally reviewed following labs and imaging studies   CBC: Recent  Labs  Lab 2019/04/26 0335 03/29/19 0355 03/30/19 0312  WBC 9.6 10.7* 11.1*  HGB 8.3* 8.2* 8.7*  HCT 26.6* 25.3* 26.9*  MCV 97.8 94.1 93.4  PLT 337 345 417*    Basic Metabolic Panel: Recent Labs  Lab 2019/04/26 0335 2019-04-26 1642 03/29/19 0355 03/30/19 0312  NA 134*  --  131* 129*  K 3.2*  --  3.4* 4.1  CL 86*  --  84* 83*  CO2 32  --  33* 30  GLUCOSE 90  --  107* 126*  BUN 62*  --  66* 66*  CREATININE 1.77*  --  1.87* 1.74*  CALCIUM 8.5*  --  8.6* 9.0  MG  --  2.2  --   --     Liver Function Tests: Recent Labs  Lab 03/27/19 0339 03/29/19 0355  AST 43* 39  ALT 20 16  ALKPHOS 147* 141*  BILITOT 1.1 1.3*  PROT 7.1 7.0  ALBUMIN 2.5* 2.3*      Microbiology Studies:   No results found for this or any previous visit (from the past 240 hour(s)).   Radiology Studies:  No results found.   Scheduled Meds:   . vitamin C  2,000 mg Oral Daily  . aspirin EC  81 mg Oral Daily  . ferrous sulfate  325 mg Oral Q breakfast  . multivitamin with minerals  1 tablet Oral Daily  . omega-3 acid ethyl esters  1 g Oral Daily  . pantoprazole  40 mg Oral Daily  . polyethylene glycol  17 g Oral BID  . potassium chloride  40 mEq Oral BID  . senna  2 tablet Oral BID  . torsemide  60 mg Oral BID  . warfarin  5 mg Oral ONCE-1800  . Warfarin - Pharmacist Dosing Inpatient   Does not apply q1800    Continuous Infusions:   . penicillin g continuous IV infusion 12 Million Units (03/30/19 0238)     LOS: 13 days     Frank Leep, MD, [place], [facility]. [facility]    To contact the attending provider between 7A-7P or the covering provider during after hours 7P-7A, please log into the web site [email or web address] and access using universal [place] password for that web site. If you do not have the password, please call the hospital operator.  03/30/2019, 1:53 PM

## 2019-03-30 NOTE — Progress Notes (Signed)
PT Cancellation Note  Patient Details Name: Frank [name] MRN: [id number] DOB: 1946-01-28   Cancelled Treatment:    Reason Eval/Treat Not Completed: Patient declined, no reason specified. Upon PT arrival pt had just returned to bed from ambulating to and from bathroom. Pt declining further mobility at this time citing fatigue. Pt reports he will ambulate with spouse at least once more tonight. PT will attempt to follow up per PT POC.   Zenaida Niece 03/30/2019, 4:33 PM

## 2019-03-30 NOTE — Progress Notes (Signed)
ANTICOAGULATION CONSULT NOTE - Follow Up Consult  Pharmacy Consult for Coumadin Indication: MVR and afib.  Allergies  Allergen Reactions  . Warfarin And Related Other (See Comments)    COUMADIN or JANTOVEN BRAND ONLY** Per pt, was switched to generic in 2001 and had significantly decreased absorption > INR subtherapeutic> pt suffered a TIA and has been on brand name only since     Patient Measurements: Height: 5\' 11"  (180.3 cm) Weight: (!) 334 lb 3.5 oz (151.6 kg) IBW/kg (Calculated) : 75.3  Vital Signs: Temp: 98.4 F (36.9 C) (12/20 0759) Temp Source: Oral (12/20 0759) BP: 121/43 (12/20 0759) Pulse Rate: 60 (12/20 0759)  Labs: Recent Labs    03/28/19 0335 03/29/19 0355 03/30/19 0312  HGB 8.3* 8.2* 8.7*  HCT 26.6* 25.3* 26.9*  PLT 337 345 417*  LABPROT 38.5* 33.3* 33.1*  INR 3.9* 3.3* 3.3*  CREATININE 1.77* 1.87* 1.74*    Estimated Creatinine Clearance: 58.3 mL/min (A) (by C-G formula based on SCr of 1.74 mg/dL (H)).   Assessment: Coumadin for mech MVR and Afib. Chronic intermittent rectal/hemorrhoidal bleeding.   INR therapeutic at 3.3 again today. Plts elevated at 417, Hgb low but improved from yesterday. Per RN, bleeding from mouth is slightly better compared to yesterday but not yet resolved. Will continue lower dose again today. Suspect that patient will need a combination of 10 mg and 5 mg doses.  Home warfarin regimen: 15mg  sun/wed, 10mg  all other days. Use COUMADIN or JANTOVEN BRAND ONLY   Goal of Therapy:  INR 2.5-3.5 Monitor platelets by anticoagulation protocol: Yes   Plan:  Warfarin 5 mg PO x 1 tonight. Check daily INR. Monitor for signs of bleeding  Richardine Service, PharmD PGY1 Pharmacy Resident Phone: [phone] 03/30/2019  11:05 AM  Please check [email or web address] for unit-specific pharmacy phone numbers.

## 2019-03-31 ENCOUNTER — Telehealth: Payer: Self-pay | Admitting: Internal Medicine

## 2019-03-31 ENCOUNTER — Other Ambulatory Visit: Payer: Self-pay

## 2019-03-31 ENCOUNTER — Telehealth: Payer: Self-pay | Admitting: Physician Assistant

## 2019-03-31 DIAGNOSIS — I421 Obstructive hypertrophic cardiomyopathy: Secondary | ICD-10-CM

## 2019-03-31 LAB — BASIC METABOLIC PANEL
Anion gap: 15 (ref 5–15)
BUN: 69 mg/dL — ABNORMAL HIGH (ref 8–23)
CO2: 31 mmol/L (ref 22–32)
Calcium: 8.8 mg/dL — ABNORMAL LOW (ref 8.9–10.3)
Chloride: 85 mmol/L — ABNORMAL LOW (ref 98–111)
Creatinine, Ser: 1.85 mg/dL — ABNORMAL HIGH (ref 0.61–1.24)
GFR calc Af Amer: 42 mL/min — ABNORMAL LOW (ref >60)
GFR calc non Af Amer: 36 mL/min — ABNORMAL LOW (ref >60)
Glucose, Bld: 114 mg/dL — ABNORMAL HIGH (ref 70–99)
Potassium: 3.9 mmol/L (ref 3.5–5.1)
Sodium: 131 mmol/L — ABNORMAL LOW (ref 135–145)

## 2019-03-31 LAB — CBC
HCT: 25.2 % — ABNORMAL LOW (ref 39.0–52.0)
Hemoglobin: 8 g/dL — ABNORMAL LOW (ref 13.0–17.0)
MCH: 30.2 pg (ref 26.0–34.0)
MCHC: 31.7 g/dL (ref 30.0–36.0)
MCV: 95.1 fL (ref 80.0–100.0)
Platelets: 380 10*3/uL (ref 150–400)
RBC: 2.65 MIL/uL — ABNORMAL LOW (ref 4.22–5.81)
RDW: 17 % — ABNORMAL HIGH (ref 11.5–15.5)
WBC: 9.7 10*3/uL (ref 4.0–10.5)
nRBC: 0.2 % (ref 0.0–0.2)

## 2019-03-31 LAB — PROTIME-INR
INR: 3.4 — ABNORMAL HIGH (ref 0.8–1.2)
Prothrombin Time: 34 seconds — ABNORMAL HIGH (ref 11.4–15.2)

## 2019-03-31 MED ORDER — PRO-STAT SUGAR FREE PO LIQD
30.0000 mL | Freq: Two times a day (BID) | ORAL | Status: DC
  Administered 2019-03-31: 30 mL via ORAL
  Filled 2019-03-31: qty 30

## 2019-03-31 MED ORDER — ACETAMINOPHEN 325 MG PO TABS: 650.0000 mg | ORAL_TABLET | Freq: Three times a day (TID) | ORAL | Status: AC | PRN

## 2019-03-31 MED ORDER — ENSURE MAX PROTEIN PO LIQD: 11.0000 [oz_av] | Freq: Every day | ORAL | 12 refills | Status: AC

## 2019-03-31 MED ORDER — LINEZOLID 600 MG PO TABS
600.0000 mg | ORAL_TABLET | Freq: Two times a day (BID) | ORAL | Status: DC
  Filled 2019-03-31: qty 1

## 2019-03-31 MED ORDER — WARFARIN SODIUM 5 MG PO TABS
5.0000 mg | ORAL_TABLET | Freq: Once | ORAL | Status: DC
  Filled 2019-03-31: qty 1

## 2019-03-31 MED ORDER — LINEZOLID 600 MG PO TABS: 600.0000 mg | ORAL_TABLET | Freq: Two times a day (BID) | ORAL | 0 refills | Status: DC

## 2019-03-31 MED ORDER — PRO-STAT SUGAR FREE PO LIQD: 30.0000 mL | Freq: Two times a day (BID) | ORAL | 0 refills | Status: AC

## 2019-03-31 MED ORDER — ENSURE MAX PROTEIN PO LIQD
11.0000 [oz_av] | Freq: Every day | ORAL | Status: DC
  Administered 2019-03-31: 11:00:00 11 [oz_av] via ORAL
  Filled 2019-03-31: qty 330

## 2019-03-31 MED ORDER — JANTOVEN 10 MG PO TABS: 5.0000 mg | ORAL_TABLET | Freq: Every day | ORAL | 0 refills | Status: AC

## 2019-03-31 MED ORDER — HYDROCODONE-ACETAMINOPHEN 5-325 MG PO TABS: 1.0000 | ORAL_TABLET | Freq: Three times a day (TID) | ORAL | 0 refills | Status: DC | PRN

## 2019-03-31 MED ORDER — POTASSIUM CHLORIDE CRYS ER 10 MEQ PO TBCR: 40.0000 meq | EXTENDED_RELEASE_TABLET | Freq: Two times a day (BID) | ORAL | Status: DC

## 2019-03-31 NOTE — TOC Transition Note (Addendum)
Transition of Care [facility]) - CM/SW Discharge Note   Patient Details  Name: Frank [name] MRN: [id number] Date of Birth: 1946-03-12  Transition of Care [facility]) CM/SW Contact:  Carles Collet, RN Phone Number: 03/31/2019, 3:55 PM   Clinical Narrative:    Notified [facility] liaison that patient will DC today. SOC for [facility] PT RN services are scheduled for tomorrow. CM updated HH order to include latest [place] recs for wound care per MD instructions. Spoke w wife to update on plan.  Per previous note patient has needed DME, verified this with wife.  Wife will provide transportation home.   Called [employer] for price check on Zyvox, it is $4.00 CM signing off     Final next level of care: [place] Barriers to Discharge: Continued Medical Work up   Patient Goals and CMS Choice Patient states their goals for this hospitalization and ongoing recovery are:: to go home CMS Medicare.gov Compare Post Acute Care list provided to:: Patient Choice offered to / list presented to : Patient  Discharge Placement                       Discharge Plan and Services   Discharge Planning Services: CM Consult Post Acute Care Choice: Home Health, Durable Medical Equipment                    HH Arranged: RN, PT [facility] Agency: [place] Date [place]: 03/25/19 Time [place]: 2080 Representative spoke with at [place]: [place] Determinants of Health (Kim) Interventions     Readmission Risk Interventions No flowsheet data found.

## 2019-03-31 NOTE — Telephone Encounter (Signed)
Patient's wife calling to get lab orders put in per husbands discharge papers from Dr. Algis Liming, "Schedule an appointment with [facility] (Cardiology) on 04/02/2019; For repeat lab work (CBC, PT, INR & CMP). Lab results to be forwarded to Dr. [name] and Dr. Grier Mitts (PCP)."   She states you can schedule the appt for him and just call with the information, you can leave a detailed message if she does not answer. He also has MyChart that she checks regularly.

## 2019-03-31 NOTE — Progress Notes (Signed)
Initial Nutrition Assessment  RD working remotely.  DOCUMENTATION CODES:   Morbid obesity  INTERVENTION:   - Continue MVI with minerals daily  - Pro-stat 30 ml po BID, each supplement provides 100 kcal and 15 grams of protein  - Ensure Max po daily, each supplement provides 150 kcal and 30 grams of protein   NUTRITION DIAGNOSIS:   Increased nutrient needs related to acute illness, wound healing as evidenced by estimated needs.  GOAL:   Patient will meet greater than or equal to 90% of their needs  MONITOR:   PO intake, Supplement acceptance, Labs, Weight trends, Skin, I & O's  REASON FOR ASSESSMENT:   Consult Assessment of nutrition requirement/status, Poor PO, Wound healing  ASSESSMENT:   73 year old male who presented to the ED on 12/06 with weakness and rectal bleeding. 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 Pt admitted with group G strep bacteremia.   12/11 - s/p TEE, negative  Weight up 4 lbs since admit. Reviewed weight history in chart. Weight fairly stable over the last year with fluctuations (150-158 kg) except for outlier weight of 114.3 kg on 07/10/18. Suspect this weight is inaccurate.  Unable to reach pt via phone call to room. Given variable PO intake, RD will order oral nutrition supplements to aid pt in meeting kcal and protein needs.  Per Rn edema assessment, pt with mild pitting generalized edema, non-pitting edema to BUE, and moderate pitting edema to BLE.  Meal Completion: 25-100% x last 8 recorded meals (several recent meals missing)  Medications reviewed and include: vitamin C 2000 mg daily, ferrous sulfate, MVI with minerals, Lovaza, protonix, Miralax, Klor-con 40 mEq BID, senna, torsemide, warfarin, VI abx  Labs reviewed:  sodium 131, chloride 85, BUN 69, creatinine 1.85, hemoglobin 8.0  UOP: 1500 ml x 24 hours  NUTRITION - FOCUSED PHYSICAL EXAM:  Unable to complete at this time. RD working remotely.  Diet Order:   Diet Order            Diet 2 gram sodium Room service appropriate? Yes; Fluid consistency: Thin  Diet effective now              EDUCATION NEEDS:   No education needs have been identified at this time  Skin:  Skin Assessment: Skin Integrity Issues: Other: cellulitis with blisters to right leg  Last BM:  03/30/09 medium type 2  Height:   Ht Readings from Last 1 Encounters:  03/17/19 5\' 11"  (1.803 m)    Weight:   Wt Readings from Last 1 Encounters:  03/31/19 (!) 153 kg    Ideal Body Weight:  78.2 kg  BMI:  Body mass index is 47.04 kg/m.  Estimated Nutritional Needs:   Kcal:  2878-6767  Protein:  115-130 grams  Fluid:  >/= 2.0 L    Gaynell Face, MS, RD, LDN Inpatient Clinical Dietitian Pager: [phone] Weekend/After Hours: [phone]

## 2019-03-31 NOTE — Telephone Encounter (Signed)
Patient's wife is requesting lab work to be ordered before his appt with Tommye Standard on 04/14/19.

## 2019-03-31 NOTE — Telephone Encounter (Signed)
Left message for patient's EC. [name] advising lab orders have been placed and a lab appt scheduled for 12/23.  Notifying [facility] that patient will be having labwork on 12/23 as follow-up from hospital admission

## 2019-03-31 NOTE — Discharge Summary (Signed)
Physician Discharge Summary  Frank [name] ZOX:[id number] DOB: 1945/12/31  PCP: [name], MD  Admitted from: Home Discharged to: Home  Admit date: 03/16/2019 Discharge date: 03/31/2019  Recommendations for Outpatient Follow-up:   Follow-up Information    [facility].   Why: HHRN/PT arranged- (they will coordinate care with [facility] for home IV abx needs) Contact information: [place] [address] [phone]        [facility] Follow up.   Why: home IV infusion pharmacy        [name], MD. Schedule an appointment as soon as possible for a visit in 1 week(s).   Specialty: Family Medicine Why: Please follow-up labs that will be done by patient's Coumadin clinic. Contact information: [place] [address] [phone]        [name], MD. Schedule an appointment as soon as possible for a visit in 1 week(s).   Specialty: Cardiology Why: Hospital Follow up. Contact information: [address] [address] [phone]        [facility] [employer]. Schedule an appointment as soon as possible for a visit on 04/02/2019.   Specialty: Cardiology Why: For repeat lab work (CBC, PT, INR, CMP & Mg).  Lab results to be forwarded to Dr. [name] and Dr. Grier Mitts (PCP). Contact information: [address], [place] [phone]         As per ID, recommend checking surveillance blood cultures 2 weeks after completing IV antibiotics (completes all antibiotics on 12/22).  Home Health: PT and RN Equipment/Devices: 3n 1.  Patient and family declined rolling walker with 5 inch wheels because they have to at home.  Discharge Condition: Improved and stable. CODE STATUS: Full Diet recommendation: Heart healthy diet.  Discharge Diagnoses:  Principal Problem:   UTI (urinary tract  infection) Active Problems:   MORBID OBESITY   S/P aortic valve repair   S/P mitral valve replacement-Mechanical   Chronic combined systolic and diastolic heart failure (HCC)   Complete heart block (HCC)   Rectal bleeding   Sepsis (HCC)   Acute respiratory failure with hypoxia (HCC)   Acute exacerbation of CHF (congestive heart failure) (HCC)   AKI (acute kidney injury) ([place])   Group G streptococcal infection   Bacteremia   Hypotension   Bullae   Brief Summary: 73 year old married male, independent, PMH of hypertrophic cardiomyopathy, s/p myoectomy with mitral valve replacement and aortic valve repair on chronic warfarin, complete heart block s/p CRT device 05/2017 with pocket infection and explant 06/2017, permanent pacemaker 07/2017, diverticulosis, hemorrhoids, chronic combined systolic and diastolic CHF, HTN, TIA, 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  Recently had colonoscopy a month prior and was told to have rectal bleeding due to hemorrhoids.  Ongoing intermittent rectal bleeding.  ED course: Febrile to 103 F, tachypneic, hypoxic at 86% on room air, WBC 29.8 with left shift, lactate 2.8, hemoglobin 8.9, FOBT positive, creatinine 2.5 (ranging between 1.2-1.6 a month prior), INR 4.7.  Urine showed large amount of leukocytes, some RBCs and rare bacteria.  Admitted for sepsis, initially felt to be due to UTI.  Admitted for sepsis due to group G strep bacteremia from severe right lower extremity cellulitis.  Hospital course complicated by decompensated combined CHF, acute on  chronic kidney disease.    Cardiology and ID consulted.  Assessment and plan  Sepsis due to group G strep bacteremia from right lower extremity nonpurulent cellulitis complicating chronic edema/lymphedema  Patient had large bullae complicating his cellulitis and  edema.  Most of the bullae eventually ruptured.  WOC assisted with management and patient's right lower extremity was treated with Ace wrap with significant improvement.  ID recommended continuous IV insulin G for 14 days through 04/01/2019.  However patient remained hospitalized until 12/21 and received IV antibiotics until then.  His INR was supratherapeutic, IR had been consulted to place central line but given that he only required a couple more days of antibiotics, his case was discussed with ID who recommended completing the 14-day course with oral Zyvox.  He needs additional 3 days of Zyvox.  Although initial sepsis felt to be due to possible UTI, this was eventually not felt the case because he was asymptomatic of UTI symptoms and there was no bacteriuria.  Surveillance blood cultures from 12/19: Negative, final report.  Sepsis resolved.  Pain improved and controlled with small doses of opioids.  Ambulating better with walker and supervision.  Given underlying chronic leg edema, it may take a long time, even a couple of weeks for his legs to return to his baseline.  This has been discussed multiple times with patient and spouse who verbalized understanding.  As per ID, patient has appointment to see Dr. Tommy Medal on 04/21/2019.  Acute on chronic combined systolic and diastolic CHF/right-sided CHF  Cardiology signed off 12/17.    Right-sided CHF likely related to OSA/OHS.  Initially treated with IV Lasix 60 mg twice daily and metolazone 5 mg daily.  Volume status has significantly improved but remains chronically volume overloaded at baseline.  -11 L since admission  Patient was transitioned to Demadex 60 mg twice daily and metolazone 5 mg daily.  However his creatinine started to rise.  Patient and spouse expressed extreme concern regarding using daily metolazone and their prior negative experience on renal function.  BMP was closely monitored, diuretics were temporarily held.  At  discharge, patient will be discharged back on prior home dose of Demadex 60 mg twice daily.  Will discontinue metolazone 5 mg every 2 weekly, Aldactone due to tenuous renal function, until close outpatient follow-up with his primary cardiologist.  Acute kidney injury complicating stage III chronic kidney disease/hyperkalemia  Possibly due to hypotension from sepsis/bacteremia.  Peak creatinine 3.65, baseline 1.2-1.6.  Hyperkalemia resolved, received Lokelma.  Over the last several days creatinine fluctuated in the 1.5-1.7 range as diuretics were being adjusted.  Creatinine at time of discharge is 1.8.  Diuretic management as noted above.  Discontinued ARB, Aldactone and metolazone until close outpatient follow-up with primary cardiologist with repeat BMP later this week.  Patient and spouse believe that his renal insufficiency is related to diuretics otherwise he had "normal renal function" earlier this year.  Recommend outpatient nephrology consultation.   Acute hypoxic respiratory failure  Suspect from decompensated CHF complicating underlying OSA/OHS.  Does have some hypoxia overnight, again concerning for OSA.  Nocturnal pulse oximetry, did not notice any overt desaturation episodes.  Resolved.  Hypotension  Suspected due to sepsis/bacteremia  Resolved.  Mechanical mitral valve and aortic valve repair  Supratherapeutic INR on admission requiring vitamin K.  TTE and TEE negative for prosthetic valve endocarditis.  INR has improved to 3.4  As discussed with pharmacy, patient will go on reduced dose of warfarin 5 mg daily until  follow-up with his Coumadin clinic in 48 hours with repeat PT and INR and Coumadin can be adjusted as needed.  Anemia secondary to iron deficiency  Hemoglobin stable in the low 8 g range.  Resumed home iron supplements.  Paroxysmal atrial fibrillation, complete heart block s/p permanent pacemaker  Followed by Dr. [name], [facility] as  outpatient.  Chronic bilateral lower extremity edema, right > left.  Venous Doppler negative for DVT.  Rest management as above.  Now with right leg Ace wrap which has helped a lot.  Obviously worsened in dependent positions.  As per patient and spouse, left lower extremity is back to his usual baseline a few years with edema.  Discussed in detail with [facility] team and made home recommendations for right lower extremity care.  Rectal bleeding  Suspected hemorrhoidal in the context of supratherapeutic INR.  Resolved.  Hypervolemic hyponatremia  Stable in the low 130s.  Body mass index is 46.61 kg/m./Morbid obesity  Consider outpatient evaluation for OSA.  Hypokalemia  Replaced.  Magnesium normal.  Epistaxis and mouth bleeding, minimal  Resolved  Constipation  Having small BMs.  Continue bowel regimen  Hypoalbuminemia    Consultations:  Cardiology  ID  Procedures:  TTE 12/9  TEE 12/11  Lower extremity venous Doppler 12/7   Discharge Instructions  Discharge Instructions    (HEART FAILURE PATIENTS) Call MD:  Anytime you have any of the following symptoms: 1) 3 pound weight gain in 24 hours or 5 pounds in 1 week 2) shortness of breath, with or without a dry hacking cough 3) swelling in the hands, feet or stomach 4) if you have to sleep on extra pillows at night in order to breathe.   Complete by: As directed    Call MD for:   Complete by: As directed    Recurrent or worsening mouth or nosebleeds.   Call MD for:  difficulty breathing, headache or visual disturbances   Complete by: As directed    Call MD for:  extreme fatigue   Complete by: As directed    Call MD for:  persistant dizziness or light-headedness   Complete by: As directed    Call MD for:  persistant nausea and vomiting   Complete by: As directed    Call MD for:  redness, tenderness, or signs of infection (pain, swelling, redness, odor or green/yellow discharge around incision site)    Complete by: As directed    Call MD for:  severe uncontrolled pain   Complete by: As directed    Call MD for:  temperature >100.4   Complete by: As directed    Diet - low sodium heart healthy   Complete by: As directed    Discharge instructions   Complete by: As directed    Acetaminophen/Tylenol dose from all sources not to exceed 4 g/day.   Discharge wound care:   Complete by: As directed    Right leg wound care recommendations:  1 Place as many Xeroform as necessary to cover EVERY bulla (blister) ruptured or intact  2. Then place ABD pads over the areas. 3. Beginning at the base of the first metatarsal head spiral wrap Kerlex to just below the knee.  4. Cover with 4" ACE wraps in spiral fashion from the base of the first metatarsal head to the knee.  5. Change 3 times a day; based on the amount of drainage.   Follow up in wound care center of patient's choice for long term management of LE edema/venous  stasis dx.   Increase activity slowly   Complete by: As directed        Medication List    STOP taking these medications   enoxaparin 150 MG/ML injection Commonly known as: Lovenox   fondaparinux 10 MG/0.8ML Soln injection Commonly known as: Arixtra   losartan 50 MG tablet Commonly known as: COZAAR   metolazone 5 MG tablet Commonly known as: ZAROXOLYN   spironolactone 25 MG tablet Commonly known as: ALDACTONE     TAKE these medications   acetaminophen 325 MG tablet Commonly known as: TYLENOL Take 2 tablets (650 mg total) by mouth every 8 (eight) hours as needed for mild pain or fever. What changed:   when to take this  reasons to take this   aspirin 81 MG tablet Take 81 mg by mouth daily.   Benefiber Chew Chew 3 tablets by mouth daily.   diphenhydramine-acetaminophen 25-500 MG Tabs tablet Commonly known as: TYLENOL PM Take 2 tablets by mouth at bedtime as needed (for sleep).   Ensure Max Protein Liqd Take 330 mLs (11 oz total) by mouth daily. Start  taking on: April 01, 2019   feeding supplement (PRO-STAT SUGAR FREE 64) Liqd Take 30 mLs by mouth 2 (two) times daily.   ferrous sulfate 325 (65 FE) MG tablet Take 325 mg by mouth daily with breakfast.   fish oil-omega-3 fatty acids 1000 MG capsule Take 4 g by mouth every morning.   HYDROcodone-acetaminophen 5-325 MG tablet Commonly known as: NORCO/VICODIN Take 1 tablet by mouth every 8 (eight) hours as needed for moderate pain or severe pain.   Jantoven 10 MG tablet Generic drug: warfarin Take as directed. If you are unsure how to take this medication, talk to your nurse or doctor. Original instructions: Take 0.5 tablets (5 mg total) by mouth daily at 6 PM. Take above dose until follow up with your Coumadin clinic then follow their instructions for dosing. What changed:   how much to take  how to take this  when to take this  additional instructions   linezolid 600 MG tablet Commonly known as: ZYVOX Take 1 tablet (600 mg total) by mouth every 12 (twelve) hours.   Magnesium 250 MG Tabs Take 500-750 mg by mouth See admin instructions. 750 mg in the morning, 500 mg in the evening   multivitamin tablet Take 1 tablet by mouth daily.   omeprazole 20 MG capsule Commonly known as: PRILOSEC Take 20 mg by mouth daily.   potassium chloride 10 MEQ tablet Commonly known as: KLOR-CON Take 4 tablets (40 mEq total) by mouth 2 (two) times daily. What changed:   how much to take  when to take this  additional instructions   torsemide 20 MG tablet Commonly known as: DEMADEX Take 3 tablets (60 mg total) by mouth 2 (two) times daily.   vitamin C 1000 MG tablet Take 2,000 mg by mouth daily.      Allergies  Allergen Reactions  . Warfarin And Related Other (See Comments)    COUMADIN or JANTOVEN BRAND ONLY** Per pt, was switched to generic in 2001 and had significantly decreased absorption > INR subtherapeutic> pt suffered a TIA and has been on brand name only since        Procedures/Studies: DG Chest Port 1 View  Result Date: 03/16/2019 CLINICAL DATA:  Shortness of breath EXAM: PORTABLE CHEST 1 VIEW COMPARISON:  July 10, 2018 FINDINGS: The heart size is significantly enlarged. The patient is status post prior median sternotomy. Aortic  calcifications are noted. There is a multi lead right-sided pacemaker in place. There is vascular congestion without overt pulmonary edema. There is no pneumothorax or large pleural effusion. There is no acute osseous abnormality. IMPRESSION: Cardiomegaly with mild volume overload. Electronically Signed   By: Constance Holster M.D.   On: 03/16/2019 23:50   DG Ankle Right Port  Result Date: 03/20/2019 CLINICAL DATA:  Right ankle pain., Redness and swelling. EXAM: PORTABLE RIGHT ANKLE - 2 VIEW COMPARISON:  None. FINDINGS: There is no acute bony or joint abnormality. No soft tissue gas or radiopaque foreign body. Soft tissues are diffusely swollen. Atherosclerosis noted. No radiopaque foreign body. IMPRESSION: Diffuse soft tissue swelling about the ankle could be due to dependent change or cellulitis. The exam is otherwise negative. Electronically Signed   By: Inge Rise M.D.   On: 03/20/2019 14:51   ECHOCARDIOGRAM COMPLETE  Result Date: 03/19/2019   ECHOCARDIOGRAM REPORT   Patient Name:   DARSH VANDEVOORT Hair Date of Exam: 03/19/2019 Medical Rec #:  [id number]       Height:       71.0 in Accession #:    [id number]      Weight:       345.7 lb Date of Birth:  1946/01/11      BSA:          2.66 m Patient Age:    66 years        BP:           99/43 mmHg Patient Gender: M               HR:           60 bpm. Exam Location:  Inpatient Procedure: 2D Echo, Cardiac Doppler, Color Doppler and Intracardiac            Opacification Agent                            MODIFIED REPORT: This report was modified by Eleonore Chiquito MD on 03/19/2019 due to error.  Indications:     Bacteremia.  History:         Patient has prior history of Echocardiogram  examinations, most                  recent 02/14/2019. Non- ischemic cardiomyopathy and CHF,                  Pacemaker and Abnormal ECG, TIA and Pulmonary HTN, Mitral Valve                  Disease, Arrythmias:Complete heart block; Risk                  Factors:Hypertension. Mitral Valve: mechanical valve is present                  in the mitral position.  Sonographer:     Roseanna Rainbow RDCS Referring Phys:  [id number] [name] Diagnosing Phys: Eleonore Chiquito MD  Sonographer Comments: Technically difficult study due to poor echo windows, Technically challenging study due to limited acoustic windows, patient is morbidly obese, suboptimal subcostal window, suboptimal apical window and suboptimal parasternal window.  Image acquisition challenging due to patient body habitus. IMPRESSIONS  1. No evidence of infective endocarditis on this study. A TEE is recommended to exclude endocarditis.  2. The patient is s/p myomectomy with mechanical MVR and AoV repair (reported in record). Overall, the  EF remains unchanged with prior. The mechanical MV prosthesis is functioning normally. The AoV is severely calcified with restricted movement. I suspect there is at least mild to moderate aortic stenosis, but doppler interrogation on this study is suboptimal. The tricuspid regurgitation on this study is severe with systolic hepatic vein flow reversal (not well evaluated on last study). Findings compared directly with study from 02/14/2019.  3. Left ventricular ejection fraction, by visual estimation, is 55 to 60%. The left ventricle has normal function. There is mildly increased left ventricular hypertrophy.  4. Definity contrast agent was given IV to delineate the left ventricular endocardial borders.  5. Left ventricular diastolic function could not be evaluated.  6. Right ventricular volume overload.  7. The left ventricle has no regional wall motion abnormalities.  8. Global right ventricle has severely reduced systolic function.The  right ventricular size is severely enlarged. No increase in right ventricular wall thickness.  9. Left atrial size was mild-moderately dilated. 10. Right atrial size was severely dilated. 11. The mitral valve has been repaired/replaced. No evidence of mitral valve regurgitation. 12. Mechanical mitral valve present. Mean gradient 5 mmHG with heart rate 60 bpm. EOA 5 mmHG. Normal functioning prosthetic valve with stable gradients compared with prior. 13. The tricuspid valve is grossly normal. Tricuspid valve regurgitation is severe. 14. Aortic valve regurgitation is mild. Mild to moderate aortic valve stenosis. 15. The aortic valve is severely calcified with restricted leaflet motion. Doppler interrogation is poor on this study. Vmax 2.5 m/s, Mean gradient ~8.5 mmHG, but suspect this is severely underestimated. There is likely a mild to moderate degree of aortic stenosis present. 16. The pulmonic valve was grossly normal. Pulmonic valve regurgitation is not visualized. 17. Mild plaque invoving the aortic root. 18. Normal pulmonary artery systolic pressure. 19. The tricuspid regurgitant velocity is 1.76 m/s, and with an assumed right atrial pressure of 15 mmHg, the estimated right ventricular systolic pressure is normal at 27.4 mmHg. 20. A pacer wire is visualized in the RA and RV. FINDINGS  Left Ventricle: Left ventricular ejection fraction, by visual estimation, is 55 to 60%. The left ventricle has normal function. Definity contrast agent was given IV to delineate the left ventricular endocardial borders. The left ventricle has no regional wall motion abnormalities. There is mildly increased left ventricular hypertrophy. Concentric left ventricular hypertrophy. The interventricular septum is flattened in diastole ('D' shaped left ventricle), consistent with right ventricular volume overload. The left ventricular diastology could not be evaluated due to mitral valve replacement/repair. Left ventricular diastolic  function could not be evaluated. Right Ventricle: The right ventricular size is severely enlarged. No increase in right ventricular wall thickness. Global RV systolic function is has severely reduced systolic function. The tricuspid regurgitant velocity is 1.76 m/s, and with an assumed right atrial pressure of 15 mmHg, the estimated right ventricular systolic pressure is normal at 27.4 mmHg. Left Atrium: Left atrial size was mild-moderately dilated. Right Atrium: Right atrial size was severely dilated Pericardium: There is no evidence of pericardial effusion. Mitral Valve: The mitral valve has been repaired/replaced. No evidence of mitral valve regurgitation. MV peak gradient, 19.1 mmHg. Mechanical mitral valve present. Mean gradient 5 mmHG with heart rate 60 bpm. EOA 5 mmHG. Normal functioning prosthetic valve with stable gradients compared with prior. Tricuspid Valve: The tricuspid valve is grossly normal. Tricuspid valve regurgitation is severe. The flow in the hepatic veins is reversed during ventricular systole. Aortic Valve: The aortic valve has been repaired/replaced. Aortic valve regurgitation is mild. Mild  to moderate aortic stenosis is present. Aortic valve mean gradient measures 8.5 mmHg. Aortic valve peak gradient measures 16.2 mmHg. Aortic valve area, by  VTI measures 2.40 cm. The aortic valve is severely calcified with restricted leaflet motion. Doppler interrogation is poor on this study. Vmax 2.5 m/s, Mean gradient ~8.5 mmHG, but suspect this is severely underestimated. There is likely a mild to moderate degree of aortic stenosis present. Pulmonic Valve: The pulmonic valve was grossly normal. Pulmonic valve regurgitation is not visualized. Pulmonic regurgitation is not visualized. Aorta: The aortic root is normal in size and structure. There is mild, layered plaque involving the aortic root. IAS/Shunts: No atrial level shunt detected by color flow Doppler. Additional Comments: A pacer wire is  visualized in the right atrium and right ventricle.  LEFT VENTRICLE PLAX 2D LVIDd:         4.10 cm       Diastology LVIDs:         3.00 cm       LV e' lateral:   11.70 cm/s LV PW:         2.10 cm       LV E/e' lateral: 12.3 LV IVS:        1.70 cm       LV e' medial:    8.59 cm/s LVOT diam:     2.20 cm       LV E/e' medial:  16.8 LV SV:         39 ml LV SV Index:   13.60 LVOT Area:     3.80 cm  LV Volumes (MOD) LV area d, A2C:    33.90 cm LV area d, A4C:    38.70 cm LV area s, A2C:    23.20 cm LV area s, A4C:    25.93 cm LV major d, A2C:   7.96 cm LV major d, A4C:   8.64 cm LV major s, A2C:   7.74 cm LV major s, A4C:   8.04 cm LV vol d, MOD A2C: 120.0 ml LV vol d, MOD A4C: 147.4 ml LV vol s, MOD A2C: 58.2 ml LV vol s, MOD A4C: 71.6 ml LV SV MOD A2C:     61.8 ml LV SV MOD A4C:     147.4 ml LV SV MOD BP:      71.1 ml RIGHT VENTRICLE            IVC RV S prime:     8.81 cm/s  IVC diam: 4.70 cm TAPSE (M-mode): 1.5 cm LEFT ATRIUM            Index       RIGHT ATRIUM           Index LA diam:      6.40 cm  2.41 cm/m  RA Area:     42.00 cm LA Vol (A2C): 108.0 ml 40.59 ml/m RA Volume:   177.00 ml 66.52 ml/m LA Vol (A4C): 147.0 ml 55.25 ml/m  AORTIC VALVE AV Area (Vmax):    2.51 cm AV Area (Vmean):   2.58 cm AV Area (VTI):     2.40 cm AV Vmax:           201.50 cm/s AV Vmean:          132.000 cm/s AV VTI:            0.419 m AV Peak Grad:      16.2 mmHg AV Mean Grad:      8.5  mmHg LVOT Vmax:         133.00 cm/s LVOT Vmean:        89.600 cm/s LVOT VTI:          0.264 m LVOT/AV VTI ratio: 0.63  AORTA Ao Root diam: 3.70 cm Ao Asc diam:  3.40 cm MITRAL VALVE                        TRICUSPID VALVE MV Area (PHT): 2.48 cm             TV Peak grad:   17.1 mmHg MV Peak grad:  19.1 mmHg            TV Mean grad:   5.0 mmHg MV Mean grad:  5.0 mmHg             TV Vmax:        2.07 m/s MV Vmax:       2.18 m/s             TV Vmean:       93.2 cm/s MV Vmean:      93.5 cm/s            TV VTI:         0.61 msec MV VTI:        0.54 m                TR Peak grad:   12.4 mmHg MV PHT:        88.74 msec           TR Vmax:        178.00 cm/s MV Decel Time: 306 msec MV E velocity: 144.00 cm/s 103 cm/s SHUNTS                                     Systemic VTI:  0.26 m                                     Systemic Diam: 2.20 cm  Eleonore Chiquito MD Electronically signed by Eleonore Chiquito MD Signature Date/Time: 03/19/2019/11:50:41 AM    Final (Updated)    ECHO TEE  Result Date: 03/21/2019   TRANSESOPHOGEAL ECHO REPORT   Patient Name:   THADDIUS MANES Chandley Date of Exam: 03/21/2019 Medical Rec #:  [id number]       Height:       71.0 in Accession #:    [id number]      Weight:       341.5 lb Date of Birth:  1946-02-27      BSA:          2.65 m Patient Age:    71 years        BP:           111/45 mmHg Patient Gender: M               HR:           62 bpm. Exam Location:  Inpatient  Procedure: Transesophageal Echo Indications:    bacteremia  History:        Patient has prior history of Echocardiogram examinations, most                 recent 03/19/2019. Mitral Valve: [employer] mechanical valve  is                 present in the mitral position. AV block. aortic valve repair.                 Cardiac valve replacement (mitral).  Sonographer:    Jannett Celestine RDCS (AE) Referring Phys: [place]: Consent was requested emergently by emergency room physicain. The transesophogeal probe was passed through the esophogus of the patient. The patient's vital signs; including heart rate, blood pressure, and oxygen saturation; remained stable throughout the procedure. The patient developed no complications during the procedure. IMPRESSIONS  1. Left ventricular ejection fraction, by visual estimation, is 55 to 60%. The left ventricle has normal function. Normal left ventricular size. There is no left ventricular hypertrophy.  2. The left ventricle has no regional wall motion abnormalities.  3. Global right ventricle has normal systolic function.The right  ventricular size is normal. No increase in right ventricular wall thickness.  4. Both RA and RV pacer wires are normal. No vegetations.  5. Left atrial size was moderately dilated.  6. Right atrial size was normal.  7. The mitral valve has been repaired/replaced. No evidence of mitral valve regurgitation. No evidence of mitral stenosis.  8. Mechanical mitral valve is functioning normally. No abscess. No rocking.  9. The tricuspid valve is normal in structure. Tricuspid valve regurgitation is severe. 10. The aortic valve is tricuspid. Aortic valve regurgitation is mild. Mild to moderate aortic valve sclerosis/calcification without any evidence of aortic stenosis. 11. The pulmonic valve was normal in structure. Pulmonic valve regurgitation is mild. 12. A pacer wire is visualized. 13. The inferior vena cava is normal in size with greater than 50% respiratory variability, suggesting right atrial pressure of 3 mmHg. 14. No vegetations. No endocarditis. FINDINGS  Left Ventricle: Left ventricular ejection fraction, by visual estimation, is 55 to 60%. The left ventricle has normal function. The left ventricle has no regional wall motion abnormalities. There is no left ventricular hypertrophy. Normal left ventricular size. Normal left atrial pressure. Right Ventricle: The right ventricular size is normal. No increase in right ventricular wall thickness. Global RV systolic function is has normal systolic function. Both RA and RV pacer wires are normal. No vegetations. Left Atrium: Left atrial size was moderately dilated. Smoke noted in LA. Right Atrium: Right atrial size was normal in size Pericardium: There is no evidence of pericardial effusion. Mitral Valve: The mitral valve has been repaired/replaced. No evidence of mitral valve stenosis by observation. MV peak gradient, 18.5 mmHg. No evidence of mitral valve regurgitation. Mechanical mitral valve is functioning normally. No abscess. No rocking. Tricuspid Valve: The  tricuspid valve is normal in structure. Tricuspid valve regurgitation is severe. Aortic Valve: The aortic valve is tricuspid. Aortic valve regurgitation is mild. Mild to moderate aortic valve sclerosis/calcification is present, without any evidence of aortic stenosis. Pulmonic Valve: The pulmonic valve was normal in structure. Pulmonic valve regurgitation is mild. Aorta: The aortic root, ascending aorta and aortic arch are all structurally normal, with no evidence of dilitation or obstruction. Venous: The inferior vena cava is normal in size with greater than 50% respiratory variability, suggesting right atrial pressure of 3 mmHg. Shunts: There is no evidence of a patent foramen ovale. No ventricular septal defect is seen or detected. There is no evidence of an atrial septal defect. No atrial level shunt detected by color flow Doppler. Additional Comments: No vegetations. No endocarditis. A pacer wire is visualized.  MITRAL VALVE             Normals MV Peak grad: 18.5 mmHg  4 mmHg MV Mean grad: 7.0 mmHg MV Vmax:      2.15 m/s MV Vmean:     125.0 cm/s MV VTI:       0.43 m  Candee Furbish MD Electronically signed by Candee Furbish MD Signature Date/Time: 03/21/2019/2:26:09 PM    Final    CUP PACEART REMOTE DEVICE CHECK  Result Date: 03/06/2019 Scheduled remote reviewed.  Normal device function.  No atrial arrhythmia detected since last remote on 02/25/2019 Next remote 91 days. Kathy Breach, RN, CCDS, [employer]  VAS [name] LOWER EXTREMITY VENOUS (DVT)  Result Date: 03/17/2019  Lower Venous Study Indications: Swelling, and Edema.  Limitations: Body habitus and poor ultrasound/tissue interface. Comparison Study: no prior Performing Technologist: Abram Sander RVS  Examination Guidelines: A complete evaluation includes B-mode imaging, spectral Doppler, color Doppler, and power Doppler as needed of all accessible portions of each vessel. Bilateral testing is considered an integral part of a complete examination.  Limited examinations for reoccurring indications may be performed as noted.  +---------+---------------+---------+-----------+----------+--------------+ RIGHT    CompressibilityPhasicitySpontaneityPropertiesThrombus Aging +---------+---------------+---------+-----------+----------+--------------+ CFV      Full           Yes      Yes                                 +---------+---------------+---------+-----------+----------+--------------+ SFJ      Full                                                        +---------+---------------+---------+-----------+----------+--------------+ FV Prox  Full                                                        +---------+---------------+---------+-----------+----------+--------------+ FV Mid                  Yes      Yes                                 +---------+---------------+---------+-----------+----------+--------------+ FV Distal               Yes      Yes                                 +---------+---------------+---------+-----------+----------+--------------+ PFV      Full                                                        +---------+---------------+---------+-----------+----------+--------------+ POP      Full           Yes      Yes                                 +---------+---------------+---------+-----------+----------+--------------+  PTV                                                   Not visualized +---------+---------------+---------+-----------+----------+--------------+ PERO                                                  Not visualized +---------+---------------+---------+-----------+----------+--------------+   +----+---------------+---------+-----------+----------+--------------+ LEFTCompressibilityPhasicitySpontaneityPropertiesThrombus Aging +----+---------------+---------+-----------+----------+--------------+ CFV                Yes      Yes                                  +----+---------------+---------+-----------+----------+--------------+     Summary: Right: There is no evidence of deep vein thrombosis in the lower extremity. However, portions of this examination were limited- see technologist comments above. No cystic structure found in the popliteal fossa. Left: No evidence of common femoral vein obstruction.  *See table(s) above for measurements and observations. Electronically signed by Servando Snare MD on 03/17/2019 at 5:03:22 PM.    Final    [name] EKG SITE RITE  Result Date: 03/22/2019 If Site Rite image not attached, placement could not be confirmed due to current cardiac rhythm.      Subjective: Overall continues to feel better.  Reports that he has ambulated a couple of times with the help of a walker and supervision from his wife.  Mouth and nose bleeding have stopped.  Had good couple of BMs.  Right leg was tightly wrapped early this morning causing pain which improved after loosening the wrap.  Overall right leg much better compared to several days ago.  Discharge Exam:  Vitals:   03/30/19 2315 03/31/19 0500 03/31/19 0847 03/31/19 1415  BP: 98/68 99/80 (!) 119/46 (!) 136/42  Pulse: 68 63 70 61  Resp: (!) 31 14 18 20   Temp: 98.5 F (36.9 C) 97.8 F (36.6 C) 98.5 F (36.9 C) 98.6 F (37 C)  TempSrc: Oral Oral Oral Oral  SpO2: 93% 96% 93% 92%  Weight:  (!) 153 kg    Height:        General exam: Elderly male, moderately built and morbidly obese sitting up comfortably at edge of bed. Respiratory system: Clear to auscultation.  No increased work of breathing. Cardiovascular system: S1 & S2 heard, RRR. No JVD, murmurs, rubs, gallops or clicks.  1-2+ left leg edema.  Left leg 1+ pitting edema.  Right leg Ace wrapping.  Right dorsal foot with substantial edema. Gastrointestinal system: Abdomen is nondistended, soft and nontender. No organomegaly or masses felt. Normal bowel sounds heard. Central nervous system: Alert and oriented. No  focal neurological deficits. Extremities: Symmetric 5 x 5 power. Skin: Right leg exam as noted above. Psychiatry: Judgement and insight appear normal. Mood & affect appropriate.  HEENT: No external nasal or mouth bleeding noted.   The results of significant diagnostics from this hospitalization (including imaging, microbiology, ancillary and laboratory) are listed below for reference.     Microbiology: No results found for this or any previous visit (from the past 240 hour(s)).   Labs: CBC: Recent Labs  Lab 03/27/19 0339 03/28/19 0335 03/29/19 0355 03/30/19  1410 03/31/19 0351  WBC 10.1 9.6 10.7* 11.1* 9.7  HGB 8.4* 8.3* 8.2* 8.7* 8.0*  HCT 26.8* 26.6* 25.3* 26.9* 25.2*  MCV 97.5 97.8 94.1 93.4 95.1  PLT 337 337 345 417* 301    Basic Metabolic Panel: Recent Labs  Lab 03/27/19 0339 03/28/19 0335 03/28/19 1642 03/29/19 0355 03/30/19 0312 03/31/19 0351  NA 136 134*  --  131* 129* 131*  K 3.5 3.2*  --  3.4* 4.1 3.9  CL 88* 86*  --  84* 83* 85*  CO2 35* 32  --  33* 30 31  GLUCOSE 112* 90  --  107* 126* 114*  BUN 58* 62*  --  66* 66* 69*  CREATININE 1.68* 1.77*  --  1.87* 1.74* 1.85*  CALCIUM 8.8* 8.5*  --  8.6* 9.0 8.8*  MG  --   --  2.2  --   --   --     Liver Function Tests: Recent Labs  Lab 03/27/19 0339 03/29/19 0355  AST 43* 39  ALT 20 16  ALKPHOS 147* 141*  BILITOT 1.1 1.3*  PROT 7.1 7.0  ALBUMIN 2.5* 2.3*     Urinalysis    Component Value Date/Time   COLORURINE YELLOW 03/17/2019 0054   APPEARANCEUR CLOUDY (A) 03/17/2019 0054   LABSPEC 1.006 03/17/2019 0054   PHURINE 6.0 03/17/2019 0054   GLUCOSEU NEGATIVE 03/17/2019 0054   HGBUR SMALL (A) 03/17/2019 0054   BILIRUBINUR NEGATIVE 03/17/2019 0054   KETONESUR NEGATIVE 03/17/2019 0054   PROTEINUR 30 (A) 03/17/2019 0054   NITRITE NEGATIVE 03/17/2019 0054   LEUKOCYTESUR LARGE (A) 03/17/2019 0054    I discussed in detail with patient spouse, updated care and answered questions.  Time  coordinating discharge: 45 minutes  SIGNED:  Vernell Leep, MD, [place], [facility]  To contact the attending provider between 7A-7P or the covering provider during after hours 7P-7A, please log into the web site [email or web address] and access using universal [place] password for that web site. If you do not have the password, please call the hospital operator.

## 2019-03-31 NOTE — Consult Note (Signed)
CWOCN recommendations:  1 Place as many Xeroform as necessary to cover EVERY bulla (blister) ruptured or intact  2. Then place ABD pads over the areas. 3. Beginning at the base of the first metatarsal head spiral wrap Kerlex to just below the knee.  4. Cover with 4" ACE wraps in spiral fashion from the base of the first metatarsal head to the knee.  5. Change 3 times a day; based on the amount of drainage.   Follow up in wound care center of patient's choice for long term management of LE edema/venous stasis dx.    Re consult if needed, will not follow at this time. Thanks  Marshella Tello [employer], RN,CWOCN, CNS, [place] [phone])

## 2019-03-31 NOTE — Progress Notes (Signed)
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 Found with sepsis from UTI and possible cellulitis of R LE, CHF.   OT comments  Pt making steady progress towards OT goals this session. Session focus on functional mobility with RW, toilet transfer/ hygiene. Overall, pt requires min guard assist for household distance functional mobility with RW needing occasional cues for RW placement. Pt completed functional mobility to [facility] and was able to complete posterior pericare with set- up assist via lateral leans. Therapist provided MAX A for cleanliness once pt sit>stand from [facility]. Pt stand pivoted back to bed with RW and min guard assist. Pt with concerns about [place] home potentially today, with therapist providing education on level of current assist pt needing and continued progress made. Feel pt appropriate to DC home with wife and HHOT when medically stable. Will continue to follow acutely per POC.    Follow Up Recommendations  Home health OT;Supervision/Assistance - 24 hour    Equipment Recommendations  3 in 1 bedside commode    Recommendations for Other Services      Precautions / Restrictions Precautions Precautions: Fall Precaution Comments: watch BP Restrictions Weight Bearing Restrictions: No       Mobility Bed Mobility               General bed mobility comments: patient received sitting up on side of bed.  Transfers Overall transfer level: Needs assistance Equipment used: Rolling walker (2 wheeled) Transfers: Sit to/from Stand Sit to Stand: Min guard          General transfer comment: min guard, occasional cues for hand placement    Balance Overall balance assessment: Needs assistance Sitting-balance support: Feet supported Sitting balance-Leahy Scale: Normal Sitting balance - Comments: supervision   Standing balance support: Bilateral upper extremity supported;During functional activity Standing balance-Leahy Scale: Fair Standing balance comment: reliant on BUE support                           ADL either performed or assessed with clinical judgement   ADL Overall ADL's : Needs assistance/impaired     Grooming: Wash/dry hands;Sitting               Lower Body Dressing: Maximal assistance;Sitting/lateral leans Lower Body Dressing Details (indicate cue type and reason): MAX A to don socks EOB Toilet Transfer: Min guard;Ambulation;RW;Cueing for safety Toilet Transfer Details (indicate cue type and reason): cues to keep RW close during simulated toilet transfer via functional mobility, min guard for safety Toileting- Clothing Manipulation and Hygiene: Maximal assistance;Set up;Sitting/lateral lean;Sit to/from stand Toileting - Clothing Manipulation Details (indicate cue type and reason): pt able to complete posterior pericare after BM with s/u assist, MAX A for cleanliness via sit>stand     Functional mobility during ADLs: Min guard;Rolling walker General ADL Comments: session focus on functional mobility with RW, toilet transfer/ hygiene. Overall pt requires min guard for functional mobility with RW and set-up- MAX A for toileting hygiene. Pt particular about ADL set- up stating he would rather not stand to groom at sink and would prefer to have BSC beside bed, even through  therapy encouraged pt to ambulate to<>from BSC. Pt states he will be able to simulate environment once at home.      Vision   Vision Assessment?: No apparent visual deficits   Perception     Praxis      Cognition Arousal/Alertness:  Awake/alert Behavior During Therapy: WFL for tasks assessed/performed Overall Cognitive Status: Within Functional Limits for tasks assessed                                 General Comments: very talkative        Exercises     Shoulder Instructions       General Comments blood from mouth sores/clots all over    Pertinent Vitals/ Pain       Pain Assessment: Faces Faces Pain Scale: Hurts even more Pain Location: R LE Pain Descriptors / Indicators: Aching;Throbbing Pain Intervention(s): Limited activity within patient's tolerance;Monitored during session;Repositioned  Home Living                                          Prior Functioning/Environment              Frequency  Min 2X/week        Progress Toward Goals  OT Goals(current goals can now be found in the care plan section)  Progress towards OT goals: Progressing toward goals  Acute Rehab OT Goals Patient Stated Goal: to get back home  OT Goal Formulation: With patient Time For Goal Achievement: 04/01/19 Potential to Achieve Goals: Good  Plan Discharge plan remains appropriate    Co-evaluation    PT/OT/SLP Co-Evaluation/Treatment: Yes Reason for Co-Treatment: For patient/therapist safety;To address functional/ADL transfers PT goals addressed during session: Mobility/safety with mobility;Balance;Proper use of DME;Strengthening/ROM OT goals addressed during session: ADL's and self-care      AM-PAC OT "6 Clicks" Daily Activity     Outcome Measure   Help from another person eating meals?: None Help from another person taking care of personal grooming?: A Little Help from another person toileting, which includes using toliet, bedpan, or urinal?: A Lot Help from another person bathing (including washing, rinsing, drying)?: A Little Help from another person to put on and taking off regular upper body clothing?: A Little Help from another person to put on and taking off  regular lower body clothing?: A Lot 6 Click Score: 17    End of Session Equipment Utilized During Treatment: Rolling walker;Other (comment)(BSC)  OT Visit Diagnosis: Other abnormalities of gait and mobility (R26.89);Muscle weakness (generalized) (M62.81);Pain Pain - Right/Left: Right Pain - part of body: Leg   Activity Tolerance Patient tolerated treatment well   Patient Left in bed;with call bell/phone within reach;with family/visitor present   Nurse Communication Mobility status        Time: 9147-8295 OT Time Calculation (min): 38 min  Charges: OT General Charges $OT Visit: 1 Visit OT Treatments $Self Care/Home Management : 8-22 mins  Lanier Clam., COTA/L Acute Rehabilitation Services [phone]   Frank [name] 03/31/2019, 1:11 PM

## 2019-03-31 NOTE — Telephone Encounter (Signed)
Spoke with patient wife. Informed that coumadin clinic appointment scheduled for 12/23 @ 1:15

## 2019-03-31 NOTE — Progress Notes (Signed)
Physical Therapy Treatment Patient Details Name: Frank [name] MRN: [id number] DOB: 30-Apr-1945 Today's Date: 03/31/2019    History of Present Illness Pt is a 73 y/o male with PMH of diverticulosis, hemorrhoids, a fib, complete heart block s/p PPM, hypertropic obstructive cardiomyopathy s/p myectomy with mechanical mitral valve replacement and aortic valve repair, CHF, HTN, TIA, LB lymphedema presenting to ED with rectal bleeding, SOB and cough. Found with sepsis from UTI and possible cellulitis of R LE, CHF.    PT Comments    Patient received sitting at EOB, willing to work with therapy today. Able to complete all functional mobility including transfers and gait approximately 84f in room with RW and min guard, occasional cues for safety and hand placement. Gait distance limited by pain in R LE. Able to have productive BM while on BSC. Education provided regarding mobility status, appropriateness for HHPT, and benefits of HHPT. He was left sitting at EOB with spouse present, all needs otherwise met this morning.     Follow Up Recommendations  Home health PT;Supervision - Intermittent     Equipment Recommendations  Rolling walker with 5" wheels    Recommendations for Other Services       Precautions / Restrictions Precautions Precautions: Fall Precaution Comments: watch BP Restrictions Weight Bearing Restrictions: No    Mobility  Bed Mobility               General bed mobility comments: patient received sitting up on side of bed.  Transfers Overall transfer level: Needs assistance Equipment used: Rolling walker (2 wheeled) Transfers: Sit to/from Stand Sit to Stand: Min guard         General transfer comment: min guard, occasional cues for hand placement  Ambulation/Gait Ambulation/Gait assistance: Min guard Gait Distance (Feet): 18 Feet Assistive device: Rolling walker (2 wheeled) Gait Pattern/deviations: Step-through pattern;Decreased stride length;Trunk  flexed;Decreased weight shift to right Gait velocity: reduced   General Gait Details: min guard for gait, VC for spacing from [employer]            Wheelchair Mobility    Modified Rankin (Stroke Patients Only)       Balance Overall balance assessment: Needs assistance Sitting-balance support: Feet supported Sitting balance-Leahy Scale: Normal Sitting balance - Comments: supervision   Standing balance support: Bilateral upper extremity supported;During functional activity Standing balance-Leahy Scale: Fair Standing balance comment: reliant on BUE support                            Cognition Arousal/Alertness: Awake/alert Behavior During Therapy: WFL for tasks assessed/performed Overall Cognitive Status: Within Functional Limits for tasks assessed                                 General Comments: very talkative      Exercises      General Comments General comments (skin integrity, edema, etc.): blood from mouth sores/clots all over      Pertinent Vitals/Pain Pain Assessment: Faces Faces Pain Scale: Hurts even more Pain Location: R LE Pain Descriptors / Indicators: Aching;Throbbing Pain Intervention(s): Limited activity within patient's tolerance;Monitored during session    Home Living                      Prior Function            PT Goals (current goals can now be  found in the care plan section) Acute Rehab PT Goals Patient Stated Goal: to get back home  PT Goal Formulation: With patient Time For Goal Achievement: 04/01/19 Potential to Achieve Goals: Good Progress towards PT goals: Progressing toward goals    Frequency    Min 3X/week      PT Plan Current plan remains appropriate    Co-evaluation PT/OT/SLP Co-Evaluation/Treatment: Yes Reason for Co-Treatment: Necessary to address cognition/behavior during functional activity PT goals addressed during session: Mobility/safety with  mobility;Balance;Proper use of DME;Strengthening/ROM        AM-PAC PT "6 Clicks" Mobility   Outcome Measure  Help needed turning from your back to your side while in a flat bed without using bedrails?: A Little Help needed moving from lying on your back to sitting on the side of a flat bed without using bedrails?: A Little Help needed moving to and from a bed to a chair (including a wheelchair)?: A Little Help needed standing up from a chair using your arms (e.g., wheelchair or bedside chair)?: A Little Help needed to walk in hospital room?: A Little Help needed climbing 3-5 steps with a railing? : A Little 6 Click Score: 18    End of Session   Activity Tolerance: Patient tolerated treatment well Patient left: in bed;with call bell/phone within reach;with family/visitor present;Other (comment)(sitting at [facility]) Nurse Communication: Mobility status PT Visit Diagnosis: Muscle weakness (generalized) (M62.81);Difficulty in walking, not elsewhere classified (R26.2);Pain Pain - Right/Left: Right Pain - part of body: Leg     Time: 3532-9924 PT Time Calculation (min) (ACUTE ONLY): 38 min  Charges:  $Gait Training: 8-22 mins $Therapeutic Activity: 8-22 mins                     Windell Norfolk, DPT, PN1   Supplemental Physical Therapist [place]    Pager [phone] Acute Rehab Office [phone]

## 2019-03-31 NOTE — Progress Notes (Addendum)
ANTICOAGULATION CONSULT NOTE - Follow Up Consult  Pharmacy Consult for Coumadin Indication: MVR and afib.  Allergies  Allergen Reactions  . Warfarin And Related Other (See Comments)    COUMADIN or JANTOVEN BRAND ONLY** Per pt, was switched to generic in 2001 and had significantly decreased absorption > INR subtherapeutic> pt suffered a TIA and has been on brand name only since     Patient Measurements: Height: 5\' 11"  (180.3 cm) Weight: (!) 337 lb 4.9 oz (153 kg) IBW/kg (Calculated) : 75.3  Vital Signs: Temp: 98.5 F (36.9 C) (12/21 0847) Temp Source: Oral (12/21 0847) BP: 119/46 (12/21 0847) Pulse Rate: 70 (12/21 0847)  Labs: Recent Labs    03/29/19 0355 03/30/19 0312 03/31/19 0351  HGB 8.2* 8.7* 8.0*  HCT 25.3* 26.9* 25.2*  PLT 345 417* 380  LABPROT 33.3* 33.1* 34.0*  INR 3.3* 3.3* 3.4*  CREATININE 1.87* 1.74* 1.85*    Estimated Creatinine Clearance: 55.1 mL/min (A) (by C-G formula based on SCr of 1.85 mg/dL (H)).   Assessment: Coumadin for mech MVR and Afib. Chronic intermittent rectal/hemorrhoidal bleeding.   INR therapeutic at 3.4 today. Plts are within normal limits, Hgb low but stable from yesterday at 8. Did receive dose on 12/20 - confirmed with nursing.   Home warfarin regimen: 15mg  sun/wed, 10mg  all other days. Use COUMADIN or JANTOVEN BRAND ONLY  Goal of Therapy:  INR 2.5-3.5 Monitor platelets by anticoagulation protocol: Yes   Plan:  Continue warfarin 5 mg PO x 1 tonight Would consider warfarin 5 mg at discharge with INR check on Wednesday (think he will need combination of 10 mg and 5 mg doses) Check daily INR while admitted Monitor for signs of bleeding  Antonietta Jewel, PharmD, BCCCP Clinical Pharmacist  Phone: [phone]  Please check AMION for all [place] phone numbers After 10:00 PM, call [place] [phone] 03/31/2019  9:03 AM

## 2019-03-31 NOTE — Telephone Encounter (Signed)
New Message:      Left a message for pt to call and schedule an appointment with Tommye Standard for a 2 weeks post hospital follow up.

## 2019-04-01 DIAGNOSIS — Z6841 Body Mass Index (BMI) 40.0 and over, adult: Secondary | ICD-10-CM | POA: Diagnosis not present

## 2019-04-01 DIAGNOSIS — K648 Other hemorrhoids: Secondary | ICD-10-CM | POA: Diagnosis not present

## 2019-04-01 DIAGNOSIS — L03115 Cellulitis of right lower limb: Secondary | ICD-10-CM | POA: Diagnosis not present

## 2019-04-01 DIAGNOSIS — M1030 Gout due to renal impairment, unspecified site: Secondary | ICD-10-CM | POA: Diagnosis not present

## 2019-04-01 DIAGNOSIS — Z8673 Personal history of transient ischemic attack (TIA), and cerebral infarction without residual deficits: Secondary | ICD-10-CM | POA: Diagnosis not present

## 2019-04-01 DIAGNOSIS — Z85828 Personal history of other malignant neoplasm of skin: Secondary | ICD-10-CM | POA: Diagnosis not present

## 2019-04-01 DIAGNOSIS — I890 Lymphedema, not elsewhere classified: Secondary | ICD-10-CM | POA: Diagnosis not present

## 2019-04-01 DIAGNOSIS — I480 Paroxysmal atrial fibrillation: Secondary | ICD-10-CM | POA: Diagnosis not present

## 2019-04-01 DIAGNOSIS — L139 Bullous disorder, unspecified: Secondary | ICD-10-CM | POA: Diagnosis not present

## 2019-04-01 DIAGNOSIS — N183 Chronic kidney disease, stage 3 unspecified: Secondary | ICD-10-CM | POA: Diagnosis not present

## 2019-04-01 DIAGNOSIS — Z4800 Encounter for change or removal of nonsurgical wound dressing: Secondary | ICD-10-CM | POA: Diagnosis not present

## 2019-04-01 DIAGNOSIS — I5043 Acute on chronic combined systolic (congestive) and diastolic (congestive) heart failure: Secondary | ICD-10-CM | POA: Diagnosis not present

## 2019-04-01 DIAGNOSIS — I130 Hypertensive heart and chronic kidney disease with heart failure and stage 1 through stage 4 chronic kidney disease, or unspecified chronic kidney disease: Secondary | ICD-10-CM | POA: Diagnosis not present

## 2019-04-01 DIAGNOSIS — I442 Atrioventricular block, complete: Secondary | ICD-10-CM | POA: Diagnosis not present

## 2019-04-01 DIAGNOSIS — Z950 Presence of cardiac pacemaker: Secondary | ICD-10-CM | POA: Diagnosis not present

## 2019-04-01 DIAGNOSIS — Z7901 Long term (current) use of anticoagulants: Secondary | ICD-10-CM | POA: Diagnosis not present

## 2019-04-01 DIAGNOSIS — Z952 Presence of prosthetic heart valve: Secondary | ICD-10-CM | POA: Diagnosis not present

## 2019-04-01 DIAGNOSIS — B954 Other streptococcus as the cause of diseases classified elsewhere: Secondary | ICD-10-CM | POA: Diagnosis not present

## 2019-04-01 DIAGNOSIS — N390 Urinary tract infection, site not specified: Secondary | ICD-10-CM | POA: Diagnosis not present

## 2019-04-01 DIAGNOSIS — D509 Iron deficiency anemia, unspecified: Secondary | ICD-10-CM | POA: Diagnosis not present

## 2019-04-01 DIAGNOSIS — K5790 Diverticulosis of intestine, part unspecified, without perforation or abscess without bleeding: Secondary | ICD-10-CM | POA: Diagnosis not present

## 2019-04-02 ENCOUNTER — Telehealth: Payer: Self-pay | Admitting: Family Medicine

## 2019-04-02 ENCOUNTER — Telehealth: Payer: Self-pay

## 2019-04-02 ENCOUNTER — Telehealth: Payer: Self-pay | Admitting: *Deleted

## 2019-04-02 ENCOUNTER — Other Ambulatory Visit: Payer: Medicare Other

## 2019-04-02 DIAGNOSIS — N390 Urinary tract infection, site not specified: Secondary | ICD-10-CM | POA: Diagnosis not present

## 2019-04-02 DIAGNOSIS — I890 Lymphedema, not elsewhere classified: Secondary | ICD-10-CM | POA: Diagnosis not present

## 2019-04-02 DIAGNOSIS — B954 Other streptococcus as the cause of diseases classified elsewhere: Secondary | ICD-10-CM | POA: Diagnosis not present

## 2019-04-02 DIAGNOSIS — L139 Bullous disorder, unspecified: Secondary | ICD-10-CM | POA: Diagnosis not present

## 2019-04-02 DIAGNOSIS — Z4800 Encounter for change or removal of nonsurgical wound dressing: Secondary | ICD-10-CM | POA: Diagnosis not present

## 2019-04-02 DIAGNOSIS — L03115 Cellulitis of right lower limb: Secondary | ICD-10-CM | POA: Diagnosis not present

## 2019-04-02 NOTE — Telephone Encounter (Signed)
Pt's wife called and stated that pt is not able to come in to his  Coumadin appointment and his lab appointment. She was asking if [facility] could check pt's INR because he is already set up with an [facility] home[facility] nurse and PT. Called and spoke [facility] who stated they would be able to go out tomorrow and check pt's INR and draw pt's labs, CBC, CMP, Magnesium which Dr. [name] ordered. Pt and wife updated.

## 2019-04-02 NOTE — Telephone Encounter (Signed)
Mickel Baas with [facility] calling to request VO for    Pt 2w8  And  OT eval

## 2019-04-02 NOTE — Telephone Encounter (Signed)
Copied from [place] [id number]. Topic: General - Other >> Apr 02, 2019  4:46 PM Rainey Pines A wrote: Patients wife would like to speak with nurse in regards to  patient getting inr and lab panel orders sent to home health organization to do lab work at home .  Best Contact number [phone]

## 2019-04-02 NOTE — Telephone Encounter (Signed)
Transition Care Management Follow-up Telephone Call  Date of discharge and from where: 03/31/2019 from [facility]   How have you been since you were released from the hospital? "I'm feeling a little better"  Any questions or concerns? Yes ; all questions answered  Items Reviewed:  Did the pt receive and understand the discharge instructions provided? Yes   Medications obtained and verified? Yes ; discharge from hospital was so late that wife was not able to obtain antibiotic until the next day.  Any new allergies since your discharge? No   Dietary orders reviewed? Yes  Do you have support at home? Yes ; wife very supportive  Other (ie: DME, Home Health, etc) yes; home health nurse and PT have started  Functional Questionnaire: (I = Independent and D = Dependent) ADL's: Patient is able to stand and ambulate a few steps with assistance of wife. He's using a walker. Physical Therapy is doing home visits as well.  Bathing/Dressing- Independent with dressing. Wife is helping with bathing.   Meal Prep- Wife does this  Eating- Independent  Maintaining continence- Independent  Transferring/Ambulation- Requires assistance of walker + wife  Managing Meds- Patient understands his medications and wife assists him as well.   Follow up appointments reviewed:    PCP Hospital f/u appt confirmed? Yes  Scheduled to [facility] Dr[facility] Volanda [facility]  Scheduled to see cardiology coumadin clinic 12/23 and has several future appointments scheduled with cardiology.  Are transportation arrangements needed? No   If their condition worsens, is the pt aware to call  their PCP or go to the ED? Yes  Was the patient provided with contact information for the PCP's office or ED? Yes  Was the pt encouraged to call back with questions or concerns? Yes

## 2019-04-03 ENCOUNTER — Telehealth: Payer: Self-pay | Admitting: Pharmacist

## 2019-04-03 ENCOUNTER — Telehealth: Payer: Self-pay | Admitting: Family Medicine

## 2019-04-03 NOTE — Telephone Encounter (Signed)
Called [facility] home [facility] since pt was supposed to have INR, CBC, CMP, and magnesium levels today and we never received a call with INR results.  They stated they would need to reach out to the home health RN to see if she even went out to see pt this AM. Advised her clinic is closing in 5 minutes for the holiday weekend. Provided her with main # to speak with on call provider to follow up if INR was checked this AM.  Pt's normal warfarin dosage is 10mg  daily except 15mg  on Sundays and Wednesdays. INR range 2.5 - 3.5.

## 2019-04-03 NOTE — Telephone Encounter (Signed)
Home Health Verbal Orders - Caller/Agency: Mickel Baas with [facility] Number: [phone], OK to leave a message Requesting OT/PT/Skilled Nursing/Social Work/Speech Therapy: PT Frequency: 2x a week for 8 weeks  Requesting OT/PT/Skilled Nursing/Social Work/Speech Therapy: OT Frequency: evaluation  Also needs written script faxed to office for a bariatric bedside commode. Please fax the script to [phone]

## 2019-04-03 NOTE — Telephone Encounter (Signed)
Received call from [place] of [name]. Per [place], lab was closed today. They will attempt to obtain lab tomorrow if possible, however if the lab is still closed, then they will obtain the lab on Saturday at the latest. Last INR was on 03/31/2019 INR 3.4.

## 2019-04-03 NOTE — Telephone Encounter (Signed)
ok 

## 2019-04-03 NOTE — Telephone Encounter (Signed)
Ok for orders? 

## 2019-04-03 NOTE — Telephone Encounter (Signed)
Please advise if ok ?

## 2019-04-05 DIAGNOSIS — I130 Hypertensive heart and chronic kidney disease with heart failure and stage 1 through stage 4 chronic kidney disease, or unspecified chronic kidney disease: Secondary | ICD-10-CM | POA: Diagnosis not present

## 2019-04-05 DIAGNOSIS — I890 Lymphedema, not elsewhere classified: Secondary | ICD-10-CM | POA: Diagnosis not present

## 2019-04-05 DIAGNOSIS — Z4800 Encounter for change or removal of nonsurgical wound dressing: Secondary | ICD-10-CM | POA: Diagnosis not present

## 2019-04-05 DIAGNOSIS — D509 Iron deficiency anemia, unspecified: Secondary | ICD-10-CM | POA: Diagnosis not present

## 2019-04-05 DIAGNOSIS — L139 Bullous disorder, unspecified: Secondary | ICD-10-CM | POA: Diagnosis not present

## 2019-04-05 DIAGNOSIS — L03115 Cellulitis of right lower limb: Secondary | ICD-10-CM | POA: Diagnosis not present

## 2019-04-05 DIAGNOSIS — B954 Other streptococcus as the cause of diseases classified elsewhere: Secondary | ICD-10-CM | POA: Diagnosis not present

## 2019-04-05 DIAGNOSIS — I480 Paroxysmal atrial fibrillation: Secondary | ICD-10-CM | POA: Diagnosis not present

## 2019-04-05 DIAGNOSIS — I5043 Acute on chronic combined systolic (congestive) and diastolic (congestive) heart failure: Secondary | ICD-10-CM | POA: Diagnosis not present

## 2019-04-05 DIAGNOSIS — Z7901 Long term (current) use of anticoagulants: Secondary | ICD-10-CM | POA: Diagnosis not present

## 2019-04-05 DIAGNOSIS — N390 Urinary tract infection, site not specified: Secondary | ICD-10-CM | POA: Diagnosis not present

## 2019-04-05 LAB — POCT INR: INR: 2.2 (ref 2.0–3.0)

## 2019-04-06 ENCOUNTER — Encounter: Payer: Self-pay | Admitting: Family Medicine

## 2019-04-06 ENCOUNTER — Telehealth: Payer: Medicare Other

## 2019-04-07 ENCOUNTER — Other Ambulatory Visit: Payer: Self-pay

## 2019-04-07 ENCOUNTER — Encounter: Payer: Self-pay | Admitting: Pharmacist

## 2019-04-07 ENCOUNTER — Encounter: Payer: Self-pay | Admitting: Family Medicine

## 2019-04-07 ENCOUNTER — Telehealth: Payer: Self-pay

## 2019-04-07 ENCOUNTER — Ambulatory Visit (INDEPENDENT_AMBULATORY_CARE_PROVIDER_SITE_OTHER): Payer: Medicare Other | Admitting: Internal Medicine

## 2019-04-07 ENCOUNTER — Encounter: Payer: Self-pay | Admitting: *Deleted

## 2019-04-07 ENCOUNTER — Other Ambulatory Visit: Payer: Self-pay | Admitting: *Deleted

## 2019-04-07 ENCOUNTER — Ambulatory Visit (INDEPENDENT_AMBULATORY_CARE_PROVIDER_SITE_OTHER): Payer: Medicare Other | Admitting: Family Medicine

## 2019-04-07 DIAGNOSIS — K625 Hemorrhage of anus and rectum: Secondary | ICD-10-CM | POA: Diagnosis not present

## 2019-04-07 DIAGNOSIS — I5043 Acute on chronic combined systolic (congestive) and diastolic (congestive) heart failure: Secondary | ICD-10-CM | POA: Diagnosis not present

## 2019-04-07 DIAGNOSIS — Z5181 Encounter for therapeutic drug level monitoring: Secondary | ICD-10-CM

## 2019-04-07 DIAGNOSIS — Z9889 Other specified postprocedural states: Secondary | ICD-10-CM | POA: Diagnosis not present

## 2019-04-07 DIAGNOSIS — I4891 Unspecified atrial fibrillation: Secondary | ICD-10-CM | POA: Diagnosis not present

## 2019-04-07 DIAGNOSIS — Z7901 Long term (current) use of anticoagulants: Secondary | ICD-10-CM

## 2019-04-07 DIAGNOSIS — G459 Transient cerebral ischemic attack, unspecified: Secondary | ICD-10-CM | POA: Diagnosis not present

## 2019-04-07 DIAGNOSIS — R600 Localized edema: Secondary | ICD-10-CM | POA: Diagnosis not present

## 2019-04-07 DIAGNOSIS — N179 Acute kidney failure, unspecified: Secondary | ICD-10-CM | POA: Diagnosis not present

## 2019-04-07 DIAGNOSIS — L03115 Cellulitis of right lower limb: Secondary | ICD-10-CM | POA: Diagnosis not present

## 2019-04-07 DIAGNOSIS — R5381 Other malaise: Secondary | ICD-10-CM

## 2019-04-07 DIAGNOSIS — Z09 Encounter for follow-up examination after completed treatment for conditions other than malignant neoplasm: Secondary | ICD-10-CM

## 2019-04-07 NOTE — Telephone Encounter (Signed)
Verbal orders given  

## 2019-04-07 NOTE — Progress Notes (Signed)
This encounter was created in error - please disregard.

## 2019-04-07 NOTE — Telephone Encounter (Signed)
Please advise 

## 2019-04-07 NOTE — Progress Notes (Signed)
Virtual Visit via Video Note  I connected with Frank [name] on 04/07/19 at 10:00 AM EST by a video enabled telemedicine application 2/2 [id number] pandemic and verified that I am speaking with the correct person using two identifiers.  Location patient: home Location provider:work or home office Persons participating in the virtual visit: patient, provider.  Pt's wife, [name] is nearby.  I discussed the limitations of evaluation and management by telemedicine and the availability of in person appointments. The patient expressed understanding and agreed to proceed.   HPI: Patient was seen today for HFU.  Admitted 12/6-12/21/20 for sepsis 2/2 cellulitis, asymptomatic UTI, CHF exacerbation, and rectal bleeding.  Cardiology and ID consulted.  Given IV PCN G x 11 days and d/c on 3 days of po Zyvox for sepsis. Pt seen by GI prior to admission, rectal bleeding thought 2/2 hemorrhoids.  INR supratherapeutic requiring vit K.    Pt states he is fatigued.  Leg draining clear fluid but looks better, warm, but not hot.  Constipation has resolved, denies bleeding.  Unable to sit comfortably 2/2 RLS.  Endorses difficulty elevating LEs.  Pain in leg and foot improving, was worried about not having enough Vicodan to get through the wknd.  Also taking tylenol.  Weight now 320 lbs.  Drinking Ensure.  Pro-stat taste like a "strong syrup and is hard to get down".  [facility] nurse obtained labs. Results pending.  Pt increased his coumadin to 10 mg from the 5 mg.  Breathing is "pretty good".  Coughing has improved, not having "the rattling in his breathing" per pt's wife.   BP stable 113/61.  EMS was called as pt needed assistance getting on his feet.  Pt was able to ambulate with walker after being helped up.  Pt was not hurt and was not transported to the ED.  Pt is looking online for a bedside commode.    ROS: See pertinent positives and negatives per HPI.  Past Medical History:  Diagnosis Date  . Atrial arrhythmia     status post ablation in [place] with complication including damage to the aortic valve  . Atrial fibrillation ([place])   . AV block, 1st degree   . Bradycardia   . Cluster headache ~ 1980   "went away after I started taking one of the heart RXs" (06/29/2017)  . Diastolic CHF, chronic ([place])   . Diverticulosis   . History of gout   . HTN (hypertension)   . Hypertrophic cardiomyopathy ([place])    s/p myoectomy  . Infection   . Internal hemorrhoids   . Morbid obesity ([place])   . Morbid obesity with BMI of 45.0-49.9, adult ([place])   . Pacemaker  -SJM   . Skin cancer    "burned off RUE X 1; cut off nose X 2" (06/29/2017)  . TIA (transient ischemic attack) 2001  . Tubulovillous adenoma     Past Surgical History:  Procedure Laterality Date  . 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 BIV PACEMAKER INSERTION CRT-P N/A 07/27/2017   Procedure: BIV PACEMAKER INSERTION CRT-P;  Surgeon: [name], MD;  Location: [place] [facility];  Service: Cardiovascular;  Laterality: N/A;  . BIV UPGRADE N/A 05/28/2017   Procedure: BIV [place];  Surgeon: [name], MD;  Location: [place] [facility];  Service: Cardiovascular;  Laterality: N/A;  . CARDIAC CATHETERIZATION     "couple times" (06/29/2017)  . CARDIAC VALVE REPLACEMENT    .  COLONOSCOPY    . COLONOSCOPY WITH PROPOFOL N/A 04/13/2016   Procedure: COLONOSCOPY WITH PROPOFOL;  Surgeon: Gatha Mayer, MD;  Location: [facility];  Service: Endoscopy;  Laterality: N/A;  . COLONOSCOPY WITH PROPOFOL N/A 02/28/2019   Procedure: COLONOSCOPY WITH PROPOFOL;  Surgeon: Gatha Mayer, MD;  Location: [facility];  Service: Endoscopy;  Laterality: N/A;  . INSERT / REPLACE / REMOVE PACEMAKER  ~ 5 3rd Dr. Jude Accent   . 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  . PACEMAKER LEAD REMOVAL N/A 06/29/2017   Procedure: NIDPOEUMPN  REMOVAL AND Four LEAD REMOVAL;  Surgeon: Evans Lance,  MD;  Location: [place];  Service: Cardiovascular;  Laterality: N/A;  . PACEMAKER REMOVAL  06/29/2017   GERNERATOR  REMOVAL AND Four LEAD REMOVAL; put in external pacemaker (06/29/2017)  . septal myectomy  07/22/1997  . SKIN CANCER EXCISION    . TEE WITHOUT CARDIOVERSION N/A 03/21/2019   Procedure: TRANSESOPHAGEAL ECHOCARDIOGRAM (TEE);  Surgeon: Jerline Pain, MD;  Location: [facility] ENDOSCOPY;  Service: Cardiovascular;  Laterality: N/A;    Family History  Problem Relation Age of Onset  . Heart disease Sister   . Uterine cancer Sister   . Obesity Sister        500+ lbs  . Heart disease Brother   . Heart disease Sister   . Heart disease Father   . Colon cancer Father 84  . Crohn's disease Paternal Grandmother     Current Outpatient Medications:  .  acetaminophen (TYLENOL) 325 MG tablet, Take 2 tablets (650 mg total) by mouth every 8 (eight) hours as needed for mild pain or fever., Disp: , Rfl:  .  Amino Acids-Protein Hydrolys (FEEDING SUPPLEMENT, PRO-STAT SUGAR FREE 64,) LIQD, Take 30 mLs by mouth 2 (two) times daily., Disp: 887 mL, Rfl: 0 .  Ascorbic Acid (VITAMIN C) 1000 MG tablet, Take 2,000 mg by mouth daily.  , Disp: , Rfl:  .  aspirin 81 MG tablet, Take 81 mg by mouth daily., Disp: , Rfl:  .  diphenhydramine-acetaminophen (TYLENOL PM) 25-500 MG TABS tablet, Take 2 tablets by mouth at bedtime as needed (for sleep)., Disp: , Rfl:  .  Ensure Max Protein (ENSURE MAX PROTEIN) LIQD, Take 330 mLs (11 oz total) by mouth daily., Disp: 330 mL, Rfl: 12 .  ferrous sulfate 325 (65 FE) MG tablet, Take 325 mg by mouth daily with breakfast., Disp: , Rfl:  .  fish oil-omega-3 fatty acids 1000 MG capsule, Take 4 g by mouth every morning. , Disp: , Rfl:  .  HYDROcodone-acetaminophen (NORCO/VICODIN) 5-325 MG tablet, Take 1 tablet by mouth every 8 (eight) hours as needed for moderate pain or severe pain., Disp: 15 tablet, Rfl: 0 .  JANTOVEN 10 MG tablet, Take 0.5 tablets (5 mg total) by mouth daily at 6 PM.  Take above dose until follow up with your Coumadin clinic then follow their instructions for dosing., Disp: 30 tablet, Rfl: 0 .  linezolid (ZYVOX) 600 MG tablet, Take 1 tablet (600 mg total) by mouth every 12 (twelve) hours. (Patient not taking: Reported on 04/02/2019), Disp: 3 tablet, Rfl: 0 .  Magnesium 250 MG TABS, Take 500-750 mg by mouth See admin instructions. 750 mg in the morning, 500 mg in the evening, Disp: , Rfl:  .  Multiple Vitamin (MULTIVITAMIN) tablet, Take 1 tablet by mouth daily.  , Disp: , Rfl:  .  omeprazole (PRILOSEC) 20 MG capsule, Take 20 mg by mouth daily.,  Disp: , Rfl:  .  potassium chloride (KLOR-CON) 10 MEQ tablet, Take 4 tablets (40 mEq total) by mouth 2 (two) times daily., Disp: , Rfl:  .  torsemide (DEMADEX) 20 MG tablet, Take 3 tablets (60 mg total) by mouth 2 (two) times daily., Disp: 540 tablet, Rfl: 3 .  Wheat Dextrin (BENEFIBER) CHEW, Chew 3 tablets by mouth daily., Disp: , Rfl:   EXAM:  VITALS per patient if applicable: RR between 81-85 bpm  GENERAL: alert, oriented, appears well and in no acute distress  HEENT: atraumatic, conjunctiva clear, no obvious abnormalities on inspection of external nose and ears  NECK: normal movements of the head and neck  LUNGS: on inspection no signs of respiratory distress, breathing rate appears normal, no obvious gross SOB, gasping or wheezing  CV: no obvious cyanosis  MS: moves all visible extremities without noticeable abnormality  PSYCH/NEURO: pleasant and cooperative, no obvious depression or anxiety, speech and thought processing grossly intact  ASSESSMENT AND PLAN:  Discussed the following assessment and plan:  Hospital discharge follow-up -notes and labs reviewed from admission -TCM reviewed.  Cellulitis of right lower extremity -improving -completed 14d abx course (11 d IV penicillin G and 3 days Zyvox po) -continue LE elevation and ACE wrap -f/u with ID, Dr. Tommy Medal, scheduled 04/21/19 -given  precautions  Acute on chronic combined systolic and diastolic CHF (congestive heart failure) ([place]) -improving -suspect some volume overload remains -continue volume and sodium restriction -continue daily wts. -demadex 60 mg BID, klor con 40 mEq BID -f/u with Cardiology -given precauitons  Bilateral lower extremity edema -RLE > LLE at baseline. -2/2 cellulitis and CHF -discussed elevating legs, wrapping LEs, and limiting sodium intake.  -discussed increasing physical activity as tolerated. -Pt to consider electric recliner lift chair.  Will provide rx.  Chronic anticoagulation -s/p MV replacement  And aortic valve repair. -supratherapeutic INR requiring Vit K -repeat INR by [facility] pending -continue coumadin.  Currently taking coumadin 10 mg daily. -close f/u with Cardiology/ coumadin clinic.  Acute kidney injury -creatinine at d/c 1.85 -baseline 1.2-1.6 -BMP pending -continue to hold metolazone, aldactone, and ARB -continue demadex 60 mg BID  Rectal bleeding -resolved -likely from hemorrhoids, exacerbated by supratherapeutic INR. -contributing to anemia.  Continue iron supplementation and Miralax  -continue to monitor -f/u with GI prn  F/u in 2 wks   I discussed the assessment and treatment plan with the patient. The patient was provided an opportunity to ask questions and all were answered. The patient agreed with the plan and demonstrated an understanding of the instructions.   The patient was advised to call back or seek an in-person evaluation if the symptoms worsen or if the condition fails to improve as anticipated.  I provided 30 minutes of non-face-to-face time during this encounter.   [name], MD   This note is not being shared with the patient for the following reason: To prevent harm (release of this note would result in harm to the life or physical safety of the patient or another).

## 2019-04-07 NOTE — Telephone Encounter (Signed)
Copied from [place] [id number]. Topic: General - Other >> Apr 07, 2019 11:16 AM Jodie Echevaria wrote: Reason for CRM: Misty with [facility] health called to give Dr Volanda Napoleon some abnormal lab results she is concerned about. 12/ Creatnine 2.77 and Sodium 130 Albumin 3.3 Chloride 88 Hemaglobin 7.6 Hematocrit 24.9 Absolute Manosite 1688 results were faxed over please advise.  Please call with further questions Ph# [phone]

## 2019-04-07 NOTE — Telephone Encounter (Signed)
Spoke with Mickel Baas with [facility], advised that verbal orders requested have been approved, nurse is also aware that Rx for bariatric bedside commode was faxed to the office.

## 2019-04-07 NOTE — Telephone Encounter (Signed)
[facility] called with INR from Sat. See anticoagulation note from 12/28

## 2019-04-07 NOTE — Patient Outreach (Signed)
[name]) Care Management [facility] CM Telephone Outreach, [place] Discharge PCP office completes Transition of Care follow up post-hospital discharge Post-hospital discharge day # 7 Unsuccessful outreach attempt # 1  04/07/2019  Frank [name] 1946/01/28 [id number]  EMMI Red Alert- General Discharge EMMI call day/ date: day #1/ April 03, 2019 Red Alert reasons: has questions about discharge papers/ instructions; does not know who to call for changes in condition; no scheduled follow up appointment; wounds not healing; other questions and concerns  Unsuccessful telephone outreach attempt to [name], 73 y/o male referred to [facility] CM this morning by [facility] CMA after EMMI Red Alert for General discharge.  Patient had recent hospitalization December 6-21, 2020 for weakness/ AKI, pulmonary edema due to sepsis thought to be related to (R) lower extremity cellulitis.  Patient was discharged home to self-care with home health services for PT and RN in place through [facility].  Patient has history including, but not limited to, combined CHF with cardiomyopathy; previous MVR/ AVR with pacemaker; HTN; CKD- III; A-Fib on ACT; and OSA.  HIPAA compliant voice mail message left for patient, requesting return call back.  Plan:  Will place [facility] CM unsuccessful patient outreach letter in mail requesting call back in writing  Will re-attempt [place] telephone outreach within 4 business days if I do not hear back from patient first.  Oneta Rack, RN, BSN, [employer] Coordinator [facility] Care Management  [phone]

## 2019-04-07 NOTE — Telephone Encounter (Signed)
Ok

## 2019-04-07 NOTE — Telephone Encounter (Signed)
Left a message for pt to call the office and schedule a f/u appointment in one and a half week from today.

## 2019-04-07 NOTE — Telephone Encounter (Signed)
Pt has a virtual visit with Dr Volanda Napoleon this morning. Spoke with pt wife state that [facility] home [facility] drew pt INR and also had blood work.

## 2019-04-07 NOTE — Telephone Encounter (Signed)
Ok for verbal order  °

## 2019-04-08 ENCOUNTER — Ambulatory Visit (INDEPENDENT_AMBULATORY_CARE_PROVIDER_SITE_OTHER): Payer: Medicare Other | Admitting: Internal Medicine

## 2019-04-08 ENCOUNTER — Encounter: Payer: Self-pay | Admitting: Family Medicine

## 2019-04-08 ENCOUNTER — Telehealth: Payer: Self-pay | Admitting: Family Medicine

## 2019-04-08 DIAGNOSIS — I4891 Unspecified atrial fibrillation: Secondary | ICD-10-CM | POA: Diagnosis not present

## 2019-04-08 DIAGNOSIS — Z4800 Encounter for change or removal of nonsurgical wound dressing: Secondary | ICD-10-CM | POA: Diagnosis not present

## 2019-04-08 DIAGNOSIS — Z5181 Encounter for therapeutic drug level monitoring: Secondary | ICD-10-CM | POA: Diagnosis not present

## 2019-04-08 DIAGNOSIS — G459 Transient cerebral ischemic attack, unspecified: Secondary | ICD-10-CM | POA: Diagnosis not present

## 2019-04-08 DIAGNOSIS — I890 Lymphedema, not elsewhere classified: Secondary | ICD-10-CM | POA: Diagnosis not present

## 2019-04-08 DIAGNOSIS — L139 Bullous disorder, unspecified: Secondary | ICD-10-CM | POA: Diagnosis not present

## 2019-04-08 DIAGNOSIS — N390 Urinary tract infection, site not specified: Secondary | ICD-10-CM | POA: Diagnosis not present

## 2019-04-08 DIAGNOSIS — B954 Other streptococcus as the cause of diseases classified elsewhere: Secondary | ICD-10-CM | POA: Diagnosis not present

## 2019-04-08 DIAGNOSIS — L03115 Cellulitis of right lower limb: Secondary | ICD-10-CM | POA: Diagnosis not present

## 2019-04-08 DIAGNOSIS — Z9889 Other specified postprocedural states: Secondary | ICD-10-CM

## 2019-04-08 LAB — POCT INR: INR: 2.5 (ref 2.0–3.0)

## 2019-04-08 MED ORDER — OMEPRAZOLE 20 MG PO CPDR: 20.0000 mg | DELAYED_RELEASE_CAPSULE | Freq: Every day | ORAL | 1 refills | Status: AC

## 2019-04-08 NOTE — Patient Instructions (Signed)
Description   Spoke with Ivin Booty RN with [facility] [place] [phone]), Patient started drinking Ensure 2-3 per day. Patient advised to resume normal dose of 10mg  daily except 15mg  on Sundays and Wednesdays. Recheck INR in 1 week. Call with any concerns any new medications or if scheduled for any procedures  [phone]

## 2019-04-08 NOTE — Telephone Encounter (Signed)
Spoke with Sonia Baller with [facility], gave verbal orders for wound care.

## 2019-04-08 NOTE — Telephone Encounter (Signed)
Home Health Verbal Orders - Caller/Agency: Sonia Baller with [facility] Number: [phone] Requesting OT/PT/Skilled Nursing/Social Work/Speech Therapy: she would like to know make sure Dr Volanda Napoleon meant to put zero form , she would like to make sure they are not be using any kind of soap saline or any wound cleaner.  ?

## 2019-04-09 ENCOUNTER — Telehealth (INDEPENDENT_AMBULATORY_CARE_PROVIDER_SITE_OTHER): Payer: Medicare Other | Admitting: Family Medicine

## 2019-04-09 ENCOUNTER — Other Ambulatory Visit: Payer: Self-pay | Admitting: *Deleted

## 2019-04-09 ENCOUNTER — Encounter: Payer: Self-pay | Admitting: *Deleted

## 2019-04-09 DIAGNOSIS — L03115 Cellulitis of right lower limb: Secondary | ICD-10-CM | POA: Diagnosis not present

## 2019-04-09 DIAGNOSIS — M1990 Unspecified osteoarthritis, unspecified site: Secondary | ICD-10-CM | POA: Diagnosis not present

## 2019-04-09 DIAGNOSIS — Z952 Presence of prosthetic heart valve: Secondary | ICD-10-CM

## 2019-04-09 DIAGNOSIS — I5043 Acute on chronic combined systolic (congestive) and diastolic (congestive) heart failure: Secondary | ICD-10-CM | POA: Diagnosis not present

## 2019-04-09 MED ORDER — HYDROCODONE-ACETAMINOPHEN 5-325 MG PO TABS: 1.0000 | ORAL_TABLET | Freq: Three times a day (TID) | ORAL | 0 refills | Status: AC | PRN

## 2019-04-09 NOTE — Patient Outreach (Addendum)
[facility]) [employer] Telephone Outreach PCP office completes Transition of Care follow up post-hospital discharge Post-hospital discharge day # 9  04/09/2019  Frank [name] [birth year] [id number]  EMMI Red Alert- General Discharge EMMI call day/ date: day #1/ April 03, 2019 Red Alert reasons: has questions about discharge papers/ instructions; does not know who to call for changes in condition; no scheduled follow up appointment; wounds not healing; other questions and concerns  Successful telephone outreach attempt to Frank [name], 73 y/o male referred to [place] 04/07/2019 by [facility] CMA after EMMI Red Alert for General discharge.  Patient had recent hospitalization December 6-21, 2020 for weakness/ AKI, pulmonary edema due to sepsis thought to be related to (R) lower extremity cellulitis.  Patient was discharged home to self-care with home health services for PT and RN in place through [facility].  Patient has history including, but not limited to, combined CHF with cardiomyopathy; previous MVR/ AVR with pacemaker; HTN; CKD- III; A-Fib on ACT; and OSA.  HIPAA/ identity verified with patient during phone call today, and he and his spouse Frank [name] both participate in phone call with phone on speaker mode; patient provides verbal consent to speak with his spouse Frank [name] "at any time" and she is noted to be on [place] dated 02/06/2017.  [facility] CM services were discussed with patient and spouse; patient provides verbal consent for [facility] CM involvement in his care post-hospital discharge.  Today, patient reports "things finally better" since his recent hospital discharge; he and spouse verbalize multiple complaints with patient's hospital discharge process; provided basic service recovery and phone number to Patient Experience department and encouraged patient and spouse to place follow up phone call, which they report they were "planning to do."  Sent  subsequent information detailing patient's complaints to [facility] CM leadership and patient experience team.  Patient reports ongoing post-hospital discharge pain in (R) LE at "4/10;" which he reports is well controlled with medications and rest; reports recent fall without injury at home on night he was discharged from hospital.  Patient sounds to be in no distress throughout 90- minute phone call today.  Patient further reports:  Medications: -- "Now" has all medicationsand takes all as prescribed;denies questions/ concerns around current medications; reports that he had to miss 2-3 doses of post-discharge antibiotics, because these were not provided by hospital team at time of hospital discharge, to bridge his obtaining from his outpatient pharmacy; reports that he has since obtained and taken/ completed his prescribed post-hospital discharge antibiotics.  Reviewed discharge papers and instructions around medications with patient; he accurately verbalizes changes made and denies concerns/ questions  -- Verbalizes good general understanding of the purpose, dosing, and scheduling of medications.   -- self-manage medications using weekly pill planner box. -- denies issues with swallowing medications -- patient declines medication review today, but agrees to this with next outreach  Home health [facility]) services: -- [place] services for PT, OT, RN in place through [employer]- has had visits from PT and RN -- confirms that [facility] services have begun; confirms has contact information for home health team -- patient's active participation in home health services was encouraged and he verbalizes understanding and agreement  Provider appointments: -- All upcoming provider appointments were reviewed with patient today; attended recent PCP post-hospital discharge office visit and has a follow up virtual visit scheduled with PCP this afternoon; verbalizes plans to attend as scheduled  Safety/ Mobility/ Falls: -- reports  recent fall "  on the night I came home from the hospital;" stated that he did not feel that hospital PT properly addressed his PT needs which caused him to fall within a short time of coming home from hospital; denies injury form this fall, but stated that he had to call EMS to come and assist him up, as his wife was unable to do -- prior to fall at home on night of hospital discharge, reports only one previous fall over the least year which he reports occurred over the summer when he "just lost balance" -- assistive devices: using walker post-hospital discharge; previously did not use assistive devices -- general fall risks/ prevention education discussed with patient/ spouse today  Holiday representative needs: -- currently denies community resource needs, stating supportive family members that assist with care needs as indicated -- spouse provides transportation for patient to all provider appointments, errands, etc -- SDOH completed for: depression/ transportation/ food insecurity: no concerns identified  Advanced Directive (AD) Planning:   --reports does not currently have exisisting AD in place and agrees to my offer to provide printed educational material around same; basics of Advanced Directive planning were discussed with patient and spouse -- endorses himself as "full" resuscitation  Self-health management of chronic disease state of CHF- cardiomyopathy/ OSA/ A-Fib : -- patient vehemently denies that he has OSA; states that when he had testing for OSA, he read the report and "all kinds of errors were documented;" stated that it was documented that he had 'all kind of symptoms that I never had;" discussed with patient process to have this diagnosis removed from his EMR- and encouraged him to discuss with his care providers to ensure there is not an active concern that he does have OSA; reports "has never been on CPAP or home O2" -- "used to" monitor/ record daily weights at home for  self-health CHF management; states unable to do now without risking a fall, due to pain in (R) LE from cellulitis; stated that he has weighed himself on days that home health PT visit, and this practice was encouraged -- does not take inhalation medications for breathing; reports "no problems" with breathing post-hospital discharge -- has "always followed low sodium diet" since diagnosed with cardiomyopathy/ CHF -- waiting to transition from coumadin to new ACT, "Jantoven" due to being allergic to generic warfarin; denies need for assistance around this, states, "everything is in place;" reports he is very familiar with coumadin cliinic team and that he calls them "any time" he has questions  Patient/ apouse deny further issues, concerns, or problems today.  I provided/ confirmed that patient has my direct phone number, the main [facility] CM office phone number, and the [facility] CM 24-hour nurse advice phone number should issues arise prior to next scheduled [place] outreach.  Encouraged patient to contact me directly if needs, questions, issues, or concerns arise prior to next scheduled outreach; patient agreed to do so.  Plan:  Patient will take medications as prescribed and will attend all scheduled provider appointments  Patient will promptly notify care providers for any new concerns/ issues/ problems that arise  Patient will actively participate in home health services as ordered post-hospital discharge  Patient will continue monitoring/ recording daily weights on days that home health PT is present in home  I will mail patient printed educational material around Advanced Directive planning and patient will review  I will make patient's PCP aware of [place] RN CM involvement in patient's care-- will send barriers  letter  [place] outreach to continue with scheduled phone call in 2 weeks for medication review and hopeful completion of [facility] CM initial assessment  [facility] CM Care  Plan Problem One     Most Recent Value  Care Plan Problem One  High Risk for hospital readmission related to/ as evidenced by recent hospitalization December 6-21, 2020 for sepsis related to lower extremity cellulits  Role Documenting the Problem One  Care Management [place] for Problem One  Active  [facility] Long Term Goal   Over the next 31 days, patient will not experience hospital readmission, as evidenced by patient reporting and review of EMR during [facility] RN CM outreach  [facility] Long Term Goal Start Date  04/09/19  Interventions for Problem One Long Term Goal  Discussed with patient and his spouse recent hospitalization,  concerns about hospital discharge post-hosptal discharge,  discussed current clinical condition and confirmed that patient eventually obtained and took post-hospital discharge medications/ antibiotic as prescribed,  confirmed that patient and wife have no current clinical concerns,  discussed [facility] RN CM role/ engaged patient with [facility] CM services and initiated [facility] CM program  [facility] CM Short Term Goal #1   Over the next 30 days, patient will attend all scheduled provider office visits as evidenced by patient reporting and review of EMR/ collaboration with PCP team as indicated during [facility] RN CM outreach  [facility] CM Short Term Goal #1 Start Date  04/09/19  Interventions for Short Term Goal #1  Reviewed with patient recent provider office visits and confirmed that he plans to attend this afternoon's follow up virtual visit with PCP,  confirmed that patient has no transportation needs and has reliable transportation through his spouse,  reviewed upcoming provider office visits with patient and ensured that he is aware of all and plans to attend all as scheduled,  encouraged patient/ spouse to promptly notify care providers for any new concerns/ issues/ problems that arise  [facility] CM Short Term Goal #2   Over the next 30 days, patient will actively participate with home health services as  ordered post-hospital discharge, as evidenced by patient reporting and collaboration with home health team as indicated during [facility] RN CM outreach  [facility] CM Short Term Goal #2 Start Date  04/09/19  Interventions for Short Term Goal #2  Confirmed that patient has engaged with home health team and is actively participating with all disciplines,  discussed difference between home health services and [facility] CM services,  encouraged patient's ongoing active participation with home health team and confirmed that he has contact information for home health team     I appreciate the opportunity to participate in Carmino's care,  Oneta Rack, RN, BSN, [employer] Coordinator [facility] Care Management  [phone]

## 2019-04-09 NOTE — Progress Notes (Signed)
Virtual Visit via Video Note  I connected with Frank [name] on 04/09/19 at  4:00 PM EST by a video enabled telemedicine application 2/2 [id number] pandemic and verified that I am speaking with the correct person using two identifiers.  Location patient: home Location provider:work or home office Persons participating in the virtual visit: patient, provider  I discussed the limitations of evaluation and management by telemedicine and the availability of in person appointments. The patient expressed understanding and agreed to proceed.   HPI: Pt taking Tylenol arthritis strength q 8 hrs. Tylenol makes pain 3-4/10. Having pain at night, 6-7/10, which makes him uncomfortable.   Pt now sleeping in the recliner which seems to help.   Pt's leg has an odor per the [facility] nurse.  Per pt's wife the odor does not smell like infection, more of a medicinal smell.  Leg draining a whitish-yellow fluid.  Denies increased warmth to the area, fever, or chills.    Labs obtained by H/H reviewed with pt.  Hgb slightly down at 7.6.  Pt notes mild bleeding of hemorrhoids with frequent wiping.  Trying to drink extra protein to help with healing.  Creatinine increased to 2.77 and Sodium 130.     ROS: See pertinent positives and negatives per HPI.  Past Medical History:  Diagnosis Date  . Atrial arrhythmia    status post ablation in [place] with complication including damage to the aortic valve  . Atrial fibrillation ([place])   . AV block, 1st degree   . Bradycardia   . Cluster headache ~ 1980   "went away after I started taking one of the heart RXs" (06/29/2017)  . Diastolic CHF, chronic ([place])   . Diverticulosis   . History of gout   . HTN (hypertension)   . Hypertrophic cardiomyopathy ([place])    s/p myoectomy  . Infection   . Internal hemorrhoids   . Morbid obesity ([place])   . Morbid obesity with BMI of 45.0-49.9, adult ([place])   . Pacemaker  -SJM   . Skin cancer    "burned off RUE X 1; cut off nose X 2"  (06/29/2017)  . TIA (transient ischemic attack) 2001  . Tubulovillous adenoma     Past Surgical History:  Procedure Laterality Date  . 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 BIV PACEMAKER INSERTION CRT-P N/A 07/27/2017   Procedure: BIV PACEMAKER INSERTION CRT-P;  Surgeon: [name], MD;  Location: [place] CV LAB;  Service: Cardiovascular;  Laterality: N/A;  . BIV UPGRADE N/A 05/28/2017   Procedure: BIV [place];  Surgeon: [name], MD;  Location: [place] CV LAB;  Service: Cardiovascular;  Laterality: N/A;  . CARDIAC CATHETERIZATION     "couple times" (06/29/2017)  . CARDIAC VALVE REPLACEMENT    . COLONOSCOPY    . COLONOSCOPY WITH PROPOFOL N/A 04/13/2016   Procedure: COLONOSCOPY WITH PROPOFOL;  Surgeon: Gatha Mayer, MD;  Location: [facility] ENDOSCOPY;  Service: Endoscopy;  Laterality: N/A;  . COLONOSCOPY WITH PROPOFOL N/A 02/28/2019   Procedure: COLONOSCOPY WITH PROPOFOL;  Surgeon: Gatha Mayer, MD;  Location: [facility] ENDOSCOPY;  Service: Endoscopy;  Laterality: N/A;  . INSERT / REPLACE / REMOVE PACEMAKER  ~ [address] [employer]   . 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  . PACEMAKER LEAD REMOVAL N/A 06/29/2017   Procedure: [id number]  REMOVAL AND Four LEAD REMOVAL;  Surgeon: Evans Lance, MD;  [facility]:  [facility] OR;  Service: Cardiovascular;  Laterality: N/A;  . PACEMAKER REMOVAL  06/29/2017   GERNERATOR  REMOVAL AND Four LEAD REMOVAL; put in external pacemaker (06/29/2017)  . septal myectomy  07/22/1997  . SKIN CANCER EXCISION    . TEE WITHOUT CARDIOVERSION N/A 03/21/2019   Procedure: TRANSESOPHAGEAL ECHOCARDIOGRAM (TEE);  Surgeon: Jerline Pain, MD;  Location: [facility] ENDOSCOPY;  Service: Cardiovascular;  Laterality: N/A;    Family History  Problem Relation Age of Onset  . Heart disease Sister   . Uterine cancer Sister   . Obesity Sister        500+ lbs  . Heart disease Brother   . Heart  disease Sister   . Heart disease Father   . Colon cancer Father 84  . Crohn's disease Paternal Grandmother      Current Outpatient Medications:  .  acetaminophen (TYLENOL) 325 MG tablet, Take 2 tablets (650 mg total) by mouth every 8 (eight) hours as needed for mild pain or fever., Disp: , Rfl:  .  Amino Acids-Protein Hydrolys (FEEDING SUPPLEMENT, PRO-STAT SUGAR FREE 64,) LIQD, Take 30 mLs by mouth 2 (two) times daily., Disp: 887 mL, Rfl: 0 .  Ascorbic Acid (VITAMIN C) 1000 MG tablet, Take 2,000 mg by mouth daily.  , Disp: , Rfl:  .  aspirin 81 MG tablet, Take 81 mg by mouth daily., Disp: , Rfl:  .  diphenhydramine-acetaminophen (TYLENOL PM) 25-500 MG TABS tablet, Take 2 tablets by mouth at bedtime as needed (for sleep)., Disp: , Rfl:  .  Ensure Max Protein (ENSURE MAX PROTEIN) LIQD, Take 330 mLs (11 oz total) by mouth daily., Disp: 330 mL, Rfl: 12 .  ferrous sulfate 325 (65 FE) MG tablet, Take 325 mg by mouth daily with breakfast., Disp: , Rfl:  .  fish oil-omega-3 fatty acids 1000 MG capsule, Take 4 g by mouth every morning. , Disp: , Rfl:  .  HYDROcodone-acetaminophen (NORCO/VICODIN) 5-325 MG tablet, Take 1 tablet by mouth every 8 (eight) hours as needed for moderate pain or severe pain., Disp: 15 tablet, Rfl: 0 .  JANTOVEN 10 MG tablet, Take 0.5 tablets (5 mg total) by mouth daily at 6 PM. Take above dose until follow up with your Coumadin clinic then follow their instructions for dosing., Disp: 30 tablet, Rfl: 0 .  linezolid (ZYVOX) 600 MG tablet, Take 1 tablet (600 mg total) by mouth every 12 (twelve) hours. (Patient not taking: Reported on 04/02/2019), Disp: 3 tablet, Rfl: 0 .  Magnesium 250 MG TABS, Take 500-750 mg by mouth See admin instructions. 750 mg in the morning, 500 mg in the evening, Disp: , Rfl:  .  Multiple Vitamin (MULTIVITAMIN) tablet, Take 1 tablet by mouth daily.  , Disp: , Rfl:  .  omeprazole (PRILOSEC) 20 MG capsule, Take 1 capsule (20 mg total) by mouth daily., Disp: 90  capsule, Rfl: 1 .  potassium chloride (KLOR-CON) 10 MEQ tablet, Take 4 tablets (40 mEq total) by mouth 2 (two) times daily., Disp: , Rfl:  .  torsemide (DEMADEX) 20 MG tablet, Take 3 tablets (60 mg total) by mouth 2 (two) times daily., Disp: 540 tablet, Rfl: 3 .  Wheat Dextrin (BENEFIBER) CHEW, Chew 3 tablets by mouth daily., Disp: , Rfl:   EXAM:  VITALS per patient if applicable:  RR between 12-20 bpm  GENERAL: alert, oriented, appears well and in no acute distress  HEENT: atraumatic, conjunctiva clear, no obvious abnormalities on inspection of external nose and ears  NECK: normal  movements of the head and neck  LUNGS: on inspection no signs of respiratory distress, breathing rate appears normal, no obvious gross SOB, gasping or wheezing  CV: no obvious cyanosis  MS: RLE with pink granulation tissue and desquamination.  Moves all visible extremities without noticeable abnormality  PSYCH/NEURO: pleasant and cooperative, no obvious depression or anxiety, speech and thought processing grossly intact  ASSESSMENT AND PLAN:  Discussed the following assessment and plan:  Cellulitis of right lower extremity  -improving.  no signs of worsening infection -continue keeping area clean and dry.  Continue to elevate LEs. -HH wound care -given precautions - Plan: HYDROcodone-acetaminophen (NORCO/VICODIN) 5-325 MG tablet  Acute on chronic combined systolic and diastolic CHF (congestive heart failure) (HCC) -stable -elevated LEs -continue current meds: demadex 60 mg BID, klor con 40 mEq BID -start daily wts.and fluid restriction  S/P mitral valve replacement-Mechanical -continue coumadin 10 ng daily -follow up with coumadin clinic and Cardiology  Arthritis -pt to work with [facility] PT -advised to use Tylenol arthritis strength during the day -Vicodin prn spraingly -weight loss advised -database reviewed  - Plan: HYDROcodone-acetaminophen (NORCO/VICODIN) 5-325 MG tablet  F/u in 1  month  I discussed the assessment and treatment plan with the patient. The patient was provided an opportunity to ask questions and all were answered. The patient agreed with the plan and demonstrated an understanding of the instructions.   The patient was advised to call back or seek an in-person evaluation if the symptoms worsen or if the condition fails to improve as anticipated.   [name], MD    This note is not being shared with the patient for the following reason: To prevent harm (release of this note would result in harm to the life or physical safety of the patient or another).

## 2019-04-10 DIAGNOSIS — N390 Urinary tract infection, site not specified: Secondary | ICD-10-CM | POA: Diagnosis not present

## 2019-04-10 DIAGNOSIS — Z4800 Encounter for change or removal of nonsurgical wound dressing: Secondary | ICD-10-CM | POA: Diagnosis not present

## 2019-04-10 DIAGNOSIS — L139 Bullous disorder, unspecified: Secondary | ICD-10-CM | POA: Diagnosis not present

## 2019-04-10 DIAGNOSIS — B954 Other streptococcus as the cause of diseases classified elsewhere: Secondary | ICD-10-CM | POA: Diagnosis not present

## 2019-04-10 DIAGNOSIS — I890 Lymphedema, not elsewhere classified: Secondary | ICD-10-CM | POA: Diagnosis not present

## 2019-04-10 DIAGNOSIS — L03115 Cellulitis of right lower limb: Secondary | ICD-10-CM | POA: Diagnosis not present

## 2019-04-10 NOTE — Telephone Encounter (Signed)
Labs reviewed with pt during recent visit.

## 2019-04-13 NOTE — Progress Notes (Signed)
Cardiology Office Note Date:  04/14/2019  Patient ID:  Frank [name], Frank [name] 1945/10/14, MRN [id number] PCP:  [name], MD  Electrophysiologist:  Dr. [name]    Chief Complaint: hospital follow up  History of Present Illness: Frank [name] is a 74 y.o. male with history of HCM (h/o myomectomy), VHD w/mechanical MVR and AVrepair, CHB w/PPM, chronic CHF (diastolic) with subsequent RV and LV dysfunction >> CRT improved, TIA, morbid obesity, Afib  February 2019 for an upgrade of his CRT device as it had reached ERI.  Unfortunately device pocket became infected and he had to undergo CRT explant 06/2017 with placement of a temp pacer >> CRT-P implant 07/2017.  He comes in today to be seen for Dr. [name]. He was admitted to [facility] 03/16/2019 after recent colonoscopy with generalized malaise, chills, shortness of breath, cough and intermittent rectal bleeding.  He was found to be febrile with a temperature of 103, tachypneic and hypoxic with sats in the 80s requiring 2 L.  WBC 29, lactic acid 2.8, creatinine 2.5 with positive fecal occult stool.  UA was remarkable for large leukocytes.  He was admitted with a working diagnosis of sepsis secondary to UTI.  Hospital course complicated by group G strep bacteremia with right lower extremity cellulitis.  ID was consulted, recommended TEE given his prosthetic valves which were fortunately negative.  Blood cultures have remained negative.  ID has been following and arranged for outpatient antibiotic therapy.  Cardiology service consulted for HF management.  Signed off case 03/27/2019 felt Right sided CHF:  Likely related to OSA/ obesity hypoventilation and multiple leads inserted and removed across TV.zaroxyln started 12/14  continue lasix iv bid With chronic changes , lymphedema and cellulitis will take another 2 weeks to get to baseline Cellulitis and edema much improved over last 72 hours Wound care RN follow up. Discharged 03/31/2019 on home IV antibiotics,  Sepsis due to group G strep bacteremia from right lower extremity nonpurulent cellulitis complicating chronic edema/lymphedema (not felt his bacteremia 2/2 UTI at time of d/c, recommended a 1 week cardiology visit  He had medicine f/u 04/07/2019 felt to still have some volume OL (thouh reported clinically better by pt/family, to continue demadex, holding metolazoe, aldactone and ARB w/AKI. Having difficulty with mobility, requesting pain medicines.  Planned f/u 2 weeks Had a video visit 04/09/2019 with [facility] reports of malodorous draining from leg wounds, rising Creat, pain requiring him to sleep in recliner Felt LE were stable, no changes to care/manegement CHF felt to be stable, asked to weight daily and avoid salt Arthritis pain, counseled on meds   He comes in today accompanied by his wife.  From a heart perspective they think he is doing OK.  His weight is down from his discharge weight, he denies any CP, no SOB at rest.  He denies DOE as well.  His wife says the only time she notes that he looks SOB is at night when laying flat no pillow and this is longstanding and long felt 2/2 his obesity.  He reports comfortable with one small pillow, tends to sleep on his side, denies symptoms of PND or orthopnea.  He ambulates at home with a walker and reports no DOE at his baseline level of activity.  He has gotten an automatic recliner that helps his get to standing and this has been great. They both feel like his swelling is better then it was at the hospital, always towards mid-day starts to accumulate. No near syncope  or syncope. He has [place] that they say is "hit or miss", but draws his blood and changes his dressings.   He sees his PMD in clinic next week and ID on 04/20/2018.  He is worried about his kidney function and about his INR stability when they make the change from Warfarin to jantoven. No ongoing hemorrhoidal bleeding reported today   Device information SJM CRT-P implanted 07/27/2017   (RA/RV leads are MDT, LV lead SJM) CURRENT DEVICE 06/30/2018: Device system extraction 2/2 pocket infection, poor healing >> temp perm placement SJM dual chamber PPM implanted 07/22/2008 >> RV lead failure with new RV lead and upgrade to CRT-P 05/28/2017   Past Medical History:  Diagnosis Date  . Atrial arrhythmia    status post ablation in [place] with complication including damage to the aortic valve  . Atrial fibrillation ([place])   . AV block, 1st degree   . Bradycardia   . Cluster headache ~ 1980   "went away after I started taking one of the heart RXs" (06/29/2017)  . Diastolic CHF, chronic ([place])   . Diverticulosis   . History of gout   . HTN (hypertension)   . Hypertrophic cardiomyopathy ([place])    s/p myoectomy  . Infection   . Internal hemorrhoids   . Morbid obesity ([place])   . Morbid obesity with BMI of 45.0-49.9, adult ([place])   . Pacemaker  -SJM   . Skin cancer    "burned off RUE X 1; cut off nose X 2" (06/29/2017)  . TIA (transient ischemic attack) 2001  . Tubulovillous adenoma     Past Surgical History:  Procedure Laterality Date  . 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 BIV PACEMAKER INSERTION CRT-P N/A 07/27/2017   Procedure: BIV PACEMAKER INSERTION CRT-P;  Surgeon: [name], MD;  Location: [place] [facility];  Service: Cardiovascular;  Laterality: N/A;  . BIV UPGRADE N/A 05/28/2017   Procedure: BIV [place];  Surgeon: [name], MD;  Location: [place] [facility];  Service: Cardiovascular;  Laterality: N/A;  . CARDIAC CATHETERIZATION     "couple times" (06/29/2017)  . CARDIAC VALVE REPLACEMENT    . COLONOSCOPY    . COLONOSCOPY WITH PROPOFOL N/A 04/13/2016   Procedure: COLONOSCOPY WITH PROPOFOL;  Surgeon: Gatha Mayer, MD;  Location: [facility];  Service: Endoscopy;  Laterality: N/A;  . COLONOSCOPY WITH PROPOFOL N/A 02/28/2019   Procedure: COLONOSCOPY WITH PROPOFOL;  Surgeon: Gatha Mayer,  MD;  Location: [facility];  Service: Endoscopy;  Laterality: N/A;  . INSERT / REPLACE / REMOVE PACEMAKER  ~ [address] [employer]   . 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 Jude  . PACEMAKER LEAD REMOVAL N/A 06/29/2017   Procedure: HQIONGEXBM  REMOVAL AND Four LEAD REMOVAL;  Surgeon: Evans Lance, MD;  Location: [place];  Service: Cardiovascular;  Laterality: N/A;  . PACEMAKER REMOVAL  06/29/2017   GERNERATOR  REMOVAL AND Four LEAD REMOVAL; put in external pacemaker (06/29/2017)  . septal myectomy  07/22/1997  . SKIN CANCER EXCISION    . TEE WITHOUT CARDIOVERSION N/A 03/21/2019   Procedure: TRANSESOPHAGEAL ECHOCARDIOGRAM (TEE);  Surgeon: Jerline Pain, MD;  Location: [facility] ENDOSCOPY;  Service: Cardiovascular;  Laterality: N/A;    Current Outpatient Medications  Medication Sig Dispense Refill  . acetaminophen (TYLENOL) 325 MG tablet Take 2 tablets (650 mg total) by mouth every 8 (eight) hours as needed for mild  pain or fever.    . Amino Acids-Protein Hydrolys (FEEDING SUPPLEMENT, PRO-STAT SUGAR FREE 64,) LIQD Take 30 mLs by mouth 2 (two) times daily. 887 mL 0  . Ascorbic Acid (VITAMIN C) 1000 MG tablet Take 2,000 mg by mouth daily.      Marland Kitchen aspirin 81 MG tablet Take 81 mg by mouth daily.    . diphenhydramine-acetaminophen (TYLENOL PM) 25-500 MG TABS tablet Take 2 tablets by mouth at bedtime as needed (for sleep).    . Ensure Max Protein (ENSURE MAX PROTEIN) LIQD Take 330 mLs (11 oz total) by mouth daily. 330 mL 12  . ferrous sulfate 325 (65 FE) MG tablet Take 325 mg by mouth daily with breakfast.    . fish oil-omega-3 fatty acids 1000 MG capsule Take 4 g by mouth every morning.     Marland Kitchen HYDROcodone-acetaminophen (NORCO/VICODIN) 5-325 MG tablet Take 1 tablet by mouth every 8 (eight) hours as needed for moderate pain or severe pain. 24 tablet 0  . JANTOVEN 10 MG tablet Take 0.5 tablets (5 mg total) by mouth daily at 6 PM. Take above dose until follow up with your Coumadin clinic then  follow their instructions for dosing. 30 tablet 0  . linezolid (ZYVOX) 600 MG tablet Take 1 tablet (600 mg total) by mouth every 12 (twelve) hours. 3 tablet 0  . Magnesium 250 MG TABS Take 500-750 mg by mouth See admin instructions. 750 mg in the morning, 500 mg in the evening    . Multiple Vitamin (MULTIVITAMIN) tablet Take 1 tablet by mouth daily.      Marland Kitchen omeprazole (PRILOSEC) 20 MG capsule Take 1 capsule (20 mg total) by mouth daily. 90 capsule 1  . potassium chloride (KLOR-CON) 10 MEQ tablet Take 4 tablets (40 mEq total) by mouth 2 (two) times daily.    Marland Kitchen torsemide (DEMADEX) 20 MG tablet Take 3 tablets (60 mg total) by mouth 2 (two) times daily. 540 tablet 3  . Wheat Dextrin (BENEFIBER) CHEW Chew 3 tablets by mouth daily.     No current facility-administered medications for this visit.    Allergies:   Warfarin and related   Social History:  The patient  reports that he quit smoking about 42 years ago. His smoking use included cigarettes, pipe, and cigars. He quit after 10.00 years of use. He has never used smokeless tobacco. He reports current alcohol use of about 2.0 standard drinks of alcohol per week. He reports that he does not use drugs.   Family History:  The patient's family history includes Colon cancer (age of onset: 27) in his father; Crohn's disease in his paternal grandmother; Heart disease in his brother, father, sister, and sister; Obesity in his sister; Uterine cancer in his sister.  ROS:  Please see the history of present illness.  All other systems are reviewed and otherwise negative.   PHYSICAL EXAM:  VS:  BP 125/64   Pulse 60   Ht 5\' 11"  (1.803 m)   Wt (!) 328 lb (148.8 kg)   BMI 45.75 kg/m  BMI: Body mass index is 45.75 kg/m. Well nourished, well developed, in no acute distress  HEENT: normocephalic, atraumatic  Neck: no JVD is appreciated, though has a beard and obese neck, no carotid bruits or masses Cardiac: RRR; no significant murmurs, no rubs, or gallops  Lungs:  CTA b/l, no wheezing, rhonchi or rales  Abd: soft, nontender, obese MS: no deformity or atrophy Ext: RLE is wrapped w/ace, LLE has 1++ edema, there is  some erythema and area/patch mid shin is quite erythematous, skin is intact Skin: warm and dry, no rash Neuro:  No gross deficits appreciated Psych: euthymic mood, full affect  PPM site (R) is stable, no tethering or discomfort   EKG:  Not done today  PPM interrogation done today and reviewed by myself:  Battery and lead measurements are good 95% AP, >99% BP No P waves at 35bpm Single conducted beat V at 35, unable to measure R waves 403AMS episodes, these appear to be some very brief AT, AF, longest 1 minute He has some noise vs very low A signal AF on his A lead This in review of older interrogations is not new, impedence/threshold stable CorView is below threshold    03/21/2019: TEE IMPRESSIONS  1. Left ventricular ejection fraction, by visual estimation, is 55 to 60%. The left ventricle has normal function. Normal left ventricular size. There is no left ventricular hypertrophy.  2. The left ventricle has no regional wall motion abnormalities.  3. Global right ventricle has normal systolic function.The right ventricular size is normal. No increase in right ventricular wall thickness.  4. Both RA and RV pacer wires are normal. No vegetations.  5. Left atrial size was moderately dilated.  6. Right atrial size was normal.  7. The mitral valve has been repaired/replaced. No evidence of mitral valve regurgitation. No evidence of mitral stenosis.  8. Mechanical mitral valve is functioning normally. No abscess. No rocking.  9. The tricuspid valve is normal in structure. Tricuspid valve regurgitation is severe. 10. The aortic valve is tricuspid. Aortic valve regurgitation is mild. Mild to moderate aortic valve sclerosis/calcification without any evidence of aortic stenosis. 11. The pulmonic valve was normal in structure.  Pulmonic valve regurgitation is mild. 12. A pacer wire is visualized. 13. The inferior vena cava is normal in size with greater than 50% respiratory variability, suggesting right atrial pressure of 3 mmHg. 14. No vegetations. No endocarditis.    Recent Labs: 03/16/2019: B Natriuretic Peptide 105.7 03/28/2019: Magnesium 2.2 03/29/2019: ALT 16 03/31/2019: BUN 69; Creatinine, Ser 1.85; Hemoglobin 8.0; Platelets 380; Potassium 3.9; Sodium 131  No results found for requested labs within last 8760 hours.   Estimated Creatinine Clearance: 54.2 mL/min (A) (by C-G formula based on SCr of 1.85 mg/dL (H)).   Wt Readings from Last 3 Encounters:  04/14/19 (!) 328 lb (148.8 kg)  03/31/19 (!) 337 lb 4.9 oz (153 kg)  02/28/19 (!) 331 lb (150.1 kg)     Other studies reviewed: Additional studies/records reviewed today include: summarized above  ASSESSMENT AND PLAN:  1. PPM     Intact function, no programming changes made     No endocarditis by TEE     ID was on case, recommended wound cultures 2 weeks after abx completion, sees them 04/20/2018     He had poor wound healing and pocket infection requiring extraction of his prior device/temp-perm placement     Unsure if he could/would be a candidate for this again?     Will defer to ID recommendations as his surveillance cultures are completed  2. Chronic CHF (HFpEF)     difficult to assess given body habitus     He weighs at home, yesterday 328lbs and steady since discharge, this is 5lbs down from his discharge weight     CorVue is trending down     He is off his metolazone and aldactone since discharge given AKI     Reportedly last week his Creat was  2.77 by his PMD note (unsure where these results are)  The patient and wife ask that we draw his blood and manage his diuretics, will get BMET today and pending them discuss with Dr. [name], plan Given his home weight is stable, not to inclined to make changes, he has no symptoms of volume OL  I  will have him enrolled with L. Short, RN, ICM clinic       3. VHD     mechanical MVR, AV repair     Both functioning well by TEE, no vegetations 4. Paroxysmal Afib     CHA2DS2Vasc is 5     On Warfarin, monitored and managed by the coumadin clinic     D/w coumadin clinic, they are expecting his next INR tomorrow via home health and will communicate with the RN and patient his warfarin management/adjustement pending his results as usual   Disposition: F/u with [name] in a month, sooner if needed.    Current medicines are reviewed at length with the patient today.  The patient did not have any concerns regarding medicines.  Venetia Night, PA-C 04/14/2019 1:20 PM     [place] [place] [address] [phone] (office)  [phone] (fax)

## 2019-04-14 ENCOUNTER — Other Ambulatory Visit: Payer: Self-pay

## 2019-04-14 ENCOUNTER — Ambulatory Visit (INDEPENDENT_AMBULATORY_CARE_PROVIDER_SITE_OTHER): Payer: Medicare Other | Admitting: Physician Assistant

## 2019-04-14 VITALS — BP 125/64 | HR 60 | Ht 71.0 in | Wt 328.0 lb

## 2019-04-14 DIAGNOSIS — I480 Paroxysmal atrial fibrillation: Secondary | ICD-10-CM

## 2019-04-14 DIAGNOSIS — Z950 Presence of cardiac pacemaker: Secondary | ICD-10-CM | POA: Diagnosis not present

## 2019-04-14 DIAGNOSIS — I5033 Acute on chronic diastolic (congestive) heart failure: Secondary | ICD-10-CM | POA: Diagnosis not present

## 2019-04-14 DIAGNOSIS — Z952 Presence of prosthetic heart valve: Secondary | ICD-10-CM | POA: Diagnosis not present

## 2019-04-14 DIAGNOSIS — I4891 Unspecified atrial fibrillation: Secondary | ICD-10-CM | POA: Diagnosis not present

## 2019-04-14 DIAGNOSIS — Z8679 Personal history of other diseases of the circulatory system: Secondary | ICD-10-CM | POA: Diagnosis not present

## 2019-04-14 DIAGNOSIS — Z9889 Other specified postprocedural states: Secondary | ICD-10-CM

## 2019-04-14 NOTE — Patient Instructions (Signed)
Medication Instructions:   Your physician recommends that you continue on your current medications as directed. Please refer to the Current Medication list given to you today.  *If you need a refill on your cardiac medications before your next appointment, please call your pharmacy*  Lab Work: BMET TODAY    If you have labs (blood work) drawn today and your tests are completely normal, you will receive your results only by: Marland Kitchen MyChart Message (if you have MyChart) OR . A paper copy in the mail If you have any lab test that is abnormal or we need to change your treatment, we will call you to review the results.  Testing/Procedures:  NONE ORDERED  TODAY   Follow-Up: At [facility], you and your health needs are our priority.  As part of our continuing mission to provide you with exceptional heart care, we have created designated Provider Care Teams.  These Care Teams include your primary Cardiologist (physician) and Advanced Practice Providers (APPs -  Physician Assistants and Nurse Practitioners) who all work together to provide you with the care you need, when you need it.  Your next appointment:    Sharman Cheek  [facility] NURSE FOR HOME  MONITORING  { Other Instructions

## 2019-04-15 ENCOUNTER — Telehealth: Payer: Self-pay | Admitting: Physician Assistant

## 2019-04-15 ENCOUNTER — Encounter: Payer: Self-pay | Admitting: *Deleted

## 2019-04-15 ENCOUNTER — Emergency Department (HOSPITAL_COMMUNITY)
Admission: EM | Admit: 2019-04-15 | Discharge: 2019-04-15 | Disposition: A | Discharge status: Home / Self Care | Payer: Medicare Other | Attending: Emergency Medicine | Admitting: Emergency Medicine

## 2019-04-15 ENCOUNTER — Encounter (HOSPITAL_COMMUNITY): Payer: Self-pay | Admitting: *Deleted

## 2019-04-15 ENCOUNTER — Telehealth: Payer: Self-pay | Admitting: Internal Medicine

## 2019-04-15 ENCOUNTER — Other Ambulatory Visit: Payer: Self-pay | Admitting: *Deleted

## 2019-04-15 ENCOUNTER — Encounter: Payer: Self-pay | Admitting: Family Medicine

## 2019-04-15 ENCOUNTER — Telehealth: Payer: Self-pay | Admitting: Cardiology

## 2019-04-15 DIAGNOSIS — E875 Hyperkalemia: Secondary | ICD-10-CM | POA: Diagnosis not present

## 2019-04-15 DIAGNOSIS — Z5321 Procedure and treatment not carried out due to patient leaving prior to being seen by health care provider: Secondary | ICD-10-CM | POA: Insufficient documentation

## 2019-04-15 LAB — BASIC METABOLIC PANEL
Anion gap: 11 (ref 5–15)
BUN/Creatinine Ratio: 29 — ABNORMAL HIGH (ref 10–24)
BUN: 52 mg/dL — ABNORMAL HIGH (ref 8–27)
BUN: 55 mg/dL — ABNORMAL HIGH (ref 8–23)
CO2: 21 mmol/L (ref 20–29)
CO2: 21 mmol/L — ABNORMAL LOW (ref 22–32)
Calcium: 9 mg/dL (ref 8.6–10.2)
Calcium: 8.8 mg/dL — ABNORMAL LOW (ref 8.9–10.3)
Chloride: 98 mmol/L (ref 96–106)
Chloride: 100 mmol/L (ref 98–111)
Creatinine, Ser: 1.8 mg/dL — ABNORMAL HIGH (ref 0.76–1.27)
Creatinine, Ser: 2 mg/dL — ABNORMAL HIGH (ref 0.61–1.24)
GFR calc Af Amer: 43 mL/min/{1.73_m2} — ABNORMAL LOW (ref >59)
GFR calc Af Amer: 38 mL/min — ABNORMAL LOW (ref >60)
GFR calc non Af Amer: 37 mL/min/{1.73_m2} — ABNORMAL LOW (ref >59)
GFR calc non Af Amer: 33 mL/min — ABNORMAL LOW (ref >60)
Glucose, Bld: 100 mg/dL — ABNORMAL HIGH (ref 70–99)
Glucose: 91 mg/dL (ref 65–99)
Potassium: 6.4 mmol/L (ref 3.5–5.2)
Potassium: 5.7 mmol/L — ABNORMAL HIGH (ref 3.5–5.1)
Sodium: 132 mmol/L — ABNORMAL LOW (ref 134–144)
Sodium: 132 mmol/L — ABNORMAL LOW (ref 135–145)

## 2019-04-15 NOTE — Telephone Encounter (Signed)
Received a call regarding a potassium of 6.4 drawn in the office yesterday. He has several co-morbities including recent hospitalization for cellulitis. Concern for worsening renal function at office visit, several of his medications have been reduced or stopped. Given concerns I advised he go to the ER. He was not agreeable initially but seemed willing to go after our conversation.

## 2019-04-15 NOTE — Telephone Encounter (Signed)
[place] from [facility] is calling stating the patient was hospitalized today 04/15/19 for high potassium. The patient left against medical advise and has refused blood draw for weekly PT/INR.

## 2019-04-15 NOTE — ED Notes (Signed)
Pt's wife's contact information added to chart. Asking to be called with updates.

## 2019-04-15 NOTE — Patient Outreach (Addendum)
[facility]) Care Management [place] Telephone Outreach Care Coordination Post-hospital discharge day # 15 04/15/2019  Frank [name] May 30, 1945 [id number]  Successful telephone outreach attempt to [name], 74 y/o male referred to [place] 04/07/2019 by [facility] CMA after EMMI Red Alert for General discharge. Patient had recent hospitalization December 6-21, 2020 for weakness/ AKI, pulmonary edema due to sepsis thought to be related to (R) lower extremity cellulitis. Patient was discharged home to self-care with home health services for PT and RN in place through [place].Patient has history including, but not limited to, combined CHF with cardiomyopathy; previous MVR/ AVR with pacemaker; HTN; CKD- III; A-Fib on ACT; and OSA.  Received voice message from patient's wife Frank [name], whom patient provided previous verbal consent to speak with/ also on DPR, requesting call back regarding patient's current ED visit and call placed to [facility] 24-hour nurse advice line last night; returned call to caregiver within 2 hours of her call to me.  HIPAA/ identity verified with patient's spouse.  Today patient's spouse reports that she contacted me due to her attempts to call the [facility] 24-hour nurse advice line last night; states that she attempted to call twice and was placed on hold each time for "over an hour" without being able to speak with a live person; wife wanted to make me aware of this; additionally, she tells me that the cardiology team contacted patient today and instructed him to go the ER for lab work; spouse had several concerns that patient was not attended to while in the ED and that he was left to sit uncomfortably in the ED while just waiting for results of lab work.  She states that patient was labelled as leaving AMA when actually what happened was that patient asked to leave ER due to being uncomfortable while waiting for lab results and the nurse told him  that it was okay for him to leave.  States that she and patient and not yet heard back from the labs drawn at the ED, nor the cardiology team to instruct on next steps; confirms that she has called cardiology and sent messages through My-Chart and that she is hopeful that she will receive a call-back soon.  Spouse also states that she has not yet heard back from the Patient Experience team about her previous concerns documented and shared with patient experience team on 04/09/2019- explained to spouse that I would send another message to the Patient experience team, and ask them to contact patient and spouse as soon as possible.  Spouse further stated that she was calling the [facility] CM 24-hour nurse advice line last night, due to concerns about the patient's (R) leg cellulitis- stated that the leg appears to be healing from an overall standpoint, bu that it had a "bad odor;" reports home health nurse is expected to come visit today to draw lab work and she is hopeful that this visit will include assessment of patient's (R) leg- encouraged spouse to proactively contact home health team to find out their schedule for today and confirmed that she has the phone number to the home health agency.  I also encouraged her to promptly contact patient's PCP if she is worried that the (R) leg looks worse, however, she said she is only concerned about the odor, and again reports that the leg "actually looks better."  Spouse deny further issues, concerns, or problems today. I confirmed that patient hasmy direct phone number, the main [facility]  CM office phone number, and the [facility] CM 24-hour nurse advice phone number should issues arise prior to next scheduled [place] outreach as scheduled.  Encouraged patient/ spouse to contact me directly if needs, questions, issues, or concerns arise prior to next scheduled outreach; patient agreed to do so.  Plan:  Will make [facility] CM leadership aware of spouses concerns around call  to 24-hour nurse advice line last night  Will again reach out to Patient Experience team and request that they respond to patient/ spouse as soon as possible  [facility] CM outreach to continue as previously scheduled next week, unless indicated sooner.  Oneta Rack, RN, BSN, [facility] Care Management  [phone]

## 2019-04-15 NOTE — Telephone Encounter (Signed)
Follow up ER labs reviewed Discussed with Dr. [name], reviewed labs, recent new medical issues. Called the patient.  He is feeling OK, no change from yesterday He has not taken any potassium yet today Take a metolazone 5mg  tab today (only today), continue torsemide as currently prescribed Repeat BMET on Thursday.   They tell me HHRN was unable to do INR today, we will see if they can do INR Thursday as well.  Tommye Standard, PA-C

## 2019-04-15 NOTE — Telephone Encounter (Signed)
Tommye Standard, PA, spoke with patient on 04/15/19. See phone note.

## 2019-04-15 NOTE — Telephone Encounter (Signed)
Patient's wife calling for lab results that he had done at the ER today. She state he was called this morning by [name] to go to ER.  She states he only has lab work done and was waiting in waiting room of the ER, was never seen by a doctor.

## 2019-04-15 NOTE — ED Triage Notes (Signed)
Pt reports going to dr office yesterday for pacemaker check, no complaints and they did routine blood work. Called and told to come here due to recheck of high potassium level. No distress noted at triage.

## 2019-04-15 NOTE — ED Notes (Signed)
Pt stated he wished to go home. Pt's wife came to pick him up.

## 2019-04-16 DIAGNOSIS — B954 Other streptococcus as the cause of diseases classified elsewhere: Secondary | ICD-10-CM | POA: Diagnosis not present

## 2019-04-16 DIAGNOSIS — L139 Bullous disorder, unspecified: Secondary | ICD-10-CM | POA: Diagnosis not present

## 2019-04-16 DIAGNOSIS — L03115 Cellulitis of right lower limb: Secondary | ICD-10-CM | POA: Diagnosis not present

## 2019-04-16 DIAGNOSIS — Z4800 Encounter for change or removal of nonsurgical wound dressing: Secondary | ICD-10-CM | POA: Diagnosis not present

## 2019-04-16 DIAGNOSIS — I890 Lymphedema, not elsewhere classified: Secondary | ICD-10-CM | POA: Diagnosis not present

## 2019-04-16 DIAGNOSIS — N390 Urinary tract infection, site not specified: Secondary | ICD-10-CM | POA: Diagnosis not present

## 2019-04-17 ENCOUNTER — Other Ambulatory Visit: Payer: Self-pay | Admitting: *Deleted

## 2019-04-17 ENCOUNTER — Other Ambulatory Visit: Payer: Medicare Other | Admitting: *Deleted

## 2019-04-17 ENCOUNTER — Telehealth (INDEPENDENT_AMBULATORY_CARE_PROVIDER_SITE_OTHER): Payer: Medicare Other | Admitting: Family Medicine

## 2019-04-17 ENCOUNTER — Other Ambulatory Visit: Payer: Self-pay

## 2019-04-17 ENCOUNTER — Ambulatory Visit (INDEPENDENT_AMBULATORY_CARE_PROVIDER_SITE_OTHER): Payer: Medicare Other | Admitting: *Deleted

## 2019-04-17 DIAGNOSIS — I4891 Unspecified atrial fibrillation: Secondary | ICD-10-CM | POA: Diagnosis not present

## 2019-04-17 DIAGNOSIS — I421 Obstructive hypertrophic cardiomyopathy: Secondary | ICD-10-CM

## 2019-04-17 DIAGNOSIS — Z9889 Other specified postprocedural states: Secondary | ICD-10-CM | POA: Diagnosis not present

## 2019-04-17 DIAGNOSIS — I5042 Chronic combined systolic (congestive) and diastolic (congestive) heart failure: Secondary | ICD-10-CM | POA: Diagnosis not present

## 2019-04-17 DIAGNOSIS — Z79899 Other long term (current) drug therapy: Secondary | ICD-10-CM

## 2019-04-17 DIAGNOSIS — G459 Transient cerebral ischemic attack, unspecified: Secondary | ICD-10-CM | POA: Diagnosis not present

## 2019-04-17 DIAGNOSIS — L03115 Cellulitis of right lower limb: Secondary | ICD-10-CM | POA: Diagnosis not present

## 2019-04-17 DIAGNOSIS — Z5181 Encounter for therapeutic drug level monitoring: Secondary | ICD-10-CM | POA: Diagnosis not present

## 2019-04-17 DIAGNOSIS — L03116 Cellulitis of left lower limb: Secondary | ICD-10-CM

## 2019-04-17 LAB — PROTIME-INR
INR: 7.1 (ref 0.9–1.2)
Prothrombin Time: 75 s — ABNORMAL HIGH (ref 9.1–12.0)

## 2019-04-17 LAB — POCT INR: INR: 7 — AB (ref 2.0–3.0)

## 2019-04-17 MED ORDER — AMOXICILLIN-POT CLAVULANATE 500-125 MG PO TABS: 1.0000 | ORAL_TABLET | Freq: Two times a day (BID) | ORAL | 0 refills | Status: DC

## 2019-04-17 MED ORDER — AMOXICILLIN-POT CLAVULANATE 875-125 MG PO TABS: 1.0000 | ORAL_TABLET | Freq: Two times a day (BID) | ORAL | 0 refills | Status: DC

## 2019-04-17 NOTE — Progress Notes (Signed)
Virtual Visit via Video Note  I connected with Vencent A. Read on 04/17/19 at  4:30 PM EST by a video enabled telemedicine application 2/2 [id number] pandemic and verified that I am speaking with the correct person using two identifiers.  Location patient: home Location provider:work or home office Persons participating in the virtual visit: patient, provider  I discussed the limitations of evaluation and management by telemedicine and the availability of in person appointments. The patient expressed understanding and agreed to proceed.   HPI: Pt is a 74 yo male with pmh sig for TIA, HTN, combined CHF, afib on coumadin, OSA, NCIM with St. Jude pacemaker, Pulm HTN, morbid obesity.   Pt hospitalized 12/6-12/21/20 for sepsis 2/2 RLE cellulitis from Grp G strep, asymptomatic UTI, CHF exacerbation, and rectal bleeding on coumadin.   Pt completed 14 day abx course: 11 d IV PCN G and 3 d zyvox at d/c.  Pt states he is doing better.  Went to an appt with [facility] for labs today.    R LE draining clear yellow fluid.  Pt and wife concerned as drainage appears black on dressing.  Pt able to walk on RLE without pain.  Not having to take Tylenol or Vicodin except once over the wknd.  HH in place.  Now LLE with mild erythema, increased warmth, edema and erythematous lesions.  Pt denies fever, chills, fatigue, n/v.  Elevating LEs when sitting.  Has f/u with ID, Dr. Tommy Medal next wk.    Pt being followed by Cardiology.  Coumadin supratherapeutic, INR 7.  Holding coumadin dose.  Has repeat INR scheduled.  Pt sent to ED 04/14/18 for hyperkalemia.  Initially 6.4.  Trended down to 5.7=>3.4.  ROS: See pertinent positives and negatives per HPI.  Past Medical History:  Diagnosis Date  . Atrial arrhythmia    status post ablation in [place] with complication including damage to the aortic valve  . Atrial fibrillation ([place])   . AV block, 1st degree   . Bradycardia   . Cluster headache ~ 1980   "went away after I  started taking one of the heart RXs" (06/29/2017)  . Diastolic CHF, chronic ([place])   . Diverticulosis   . History of gout   . HTN (hypertension)   . Hypertrophic cardiomyopathy ([place])    s/p myoectomy  . Infection   . Internal hemorrhoids   . Morbid obesity ([place])   . Morbid obesity with BMI of 45.0-49.9, adult ([place])   . Pacemaker  -SJM   . Skin cancer    "burned off RUE X 1; cut off nose X 2" (06/29/2017)  . TIA (transient ischemic attack) 2001  . Tubulovillous adenoma     Past Surgical History:  Procedure Laterality Date  . 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 BIV PACEMAKER INSERTION CRT-P N/A 07/27/2017   Procedure: BIV PACEMAKER INSERTION CRT-P;  Surgeon: [name], MD;  Location: [facility];  Service: Cardiovascular;  Laterality: N/A;  . BIV UPGRADE N/A 05/28/2017   Procedure: BIV [place];  Surgeon: [name], MD;  Location: [facility];  Service: Cardiovascular;  Laterality: N/A;  . CARDIAC CATHETERIZATION     "couple times" (06/29/2017)  . CARDIAC VALVE REPLACEMENT    . COLONOSCOPY    . COLONOSCOPY WITH PROPOFOL N/A 04/13/2016   Procedure: COLONOSCOPY WITH PROPOFOL;  Surgeon: Gatha Mayer, MD;  [facility];  Service: Endoscopy;  Laterality: N/A;  .  COLONOSCOPY WITH PROPOFOL N/A 02/28/2019   Procedure: COLONOSCOPY WITH PROPOFOL;  Surgeon: Gatha Mayer, MD;  [facility];  Service: Endoscopy;  Laterality: N/A;  . INSERT / REPLACE / REMOVE PACEMAKER  ~ [address] Jude [employer]   . 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 Jude  . PACEMAKER LEAD REMOVAL N/A 06/29/2017   Procedure: [id number]  REMOVAL AND Four LEAD REMOVAL;  Surgeon: Evans Lance, MD;  Location: [facility];  Service: Cardiovascular;  Laterality: N/A;  . PACEMAKER REMOVAL  06/29/2017   GERNERATOR  REMOVAL AND Four LEAD REMOVAL; put in external pacemaker (06/29/2017)  . septal myectomy   07/22/1997  . SKIN CANCER EXCISION    . TEE WITHOUT CARDIOVERSION N/A 03/21/2019   Procedure: TRANSESOPHAGEAL ECHOCARDIOGRAM (TEE);  Surgeon: Jerline Pain, MD;  Location: [facility];  Service: Cardiovascular;  Laterality: N/A;    Family History  Problem Relation Age of Onset  . Heart disease Sister   . Uterine cancer Sister   . Obesity Sister        500+ lbs  . Heart disease Brother   . Heart disease Sister   . Heart disease Father   . Colon cancer Father 56  . Crohn's disease Paternal Grandmother     Current Outpatient Medications:  .  acetaminophen (TYLENOL) 325 MG tablet, Take 2 tablets (650 mg total) by mouth every 8 (eight) hours as needed for mild pain or fever., Disp: , Rfl:  .  Amino Acids-Protein Hydrolys (FEEDING SUPPLEMENT, PRO-STAT SUGAR FREE 64,) LIQD, Take 30 mLs by mouth 2 (two) times daily., Disp: 887 mL, Rfl: 0 .  Ascorbic Acid (VITAMIN C) 1000 MG tablet, Take 2,000 mg by mouth daily.  , Disp: , Rfl:  .  aspirin 81 MG tablet, Take 81 mg by mouth daily., Disp: , Rfl:  .  diphenhydramine-acetaminophen (TYLENOL PM) 25-500 MG TABS tablet, Take 2 tablets by mouth at bedtime as needed (for sleep)., Disp: , Rfl:  .  Ensure Max Protein (ENSURE MAX PROTEIN) LIQD, Take 330 mLs (11 oz total) by mouth daily., Disp: 330 mL, Rfl: 12 .  ferrous sulfate 325 (65 FE) MG tablet, Take 325 mg by mouth daily with breakfast., Disp: , Rfl:  .  fish oil-omega-3 fatty acids 1000 MG capsule, Take 4 g by mouth every morning. , Disp: , Rfl:  .  HYDROcodone-acetaminophen (NORCO/VICODIN) 5-325 MG tablet, Take 1 tablet by mouth every 8 (eight) hours as needed for moderate pain or severe pain., Disp: 24 tablet, Rfl: 0 .  JANTOVEN 10 MG tablet, Take 0.5 tablets (5 mg total) by mouth daily at 6 PM. Take above dose until follow up with your Coumadin clinic then follow their instructions for dosing., Disp: 30 tablet, Rfl: 0 .  linezolid (ZYVOX) 600 MG tablet, Take 1 tablet (600 mg total) by mouth every  12 (twelve) hours., Disp: 3 tablet, Rfl: 0 .  Magnesium 250 MG TABS, Take 500-750 mg by mouth See admin instructions. 750 mg in the morning, 500 mg in the evening, Disp: , Rfl:  .  Multiple Vitamin (MULTIVITAMIN) tablet, Take 1 tablet by mouth daily.  , Disp: , Rfl:  .  omeprazole (PRILOSEC) 20 MG capsule, Take 1 capsule (20 mg total) by mouth daily., Disp: 90 capsule, Rfl: 1 .  potassium chloride (KLOR-CON) 10 MEQ tablet, Take 4 tablets (40 mEq total) by mouth 2 (two) times daily., Disp: , Rfl:  .  torsemide (DEMADEX) 20 MG tablet, Take  3 tablets (60 mg total) by mouth 2 (two) times daily., Disp: 540 tablet, Rfl: 3 .  Wheat Dextrin (BENEFIBER) CHEW, Chew 3 tablets by mouth daily., Disp: , Rfl:   EXAM:  VITALS per patient if applicable: RR between 86-76 bpm  GENERAL: alert, oriented, appears well and in no acute distress  HEENT: atraumatic, conjunctiva clear, no obvious abnormalities on inspection of external nose and ears  NECK: normal movements of the head and neck  LUNGS: on inspection no signs of respiratory distress, breathing rate appears normal, no obvious gross SOB, gasping or wheezing  CV: no obvious cyanosis  MS: moves all visible extremities.  RLE>LLE at baseline SKIN: mild erythema of RLE improving, within previously marked area.  LLE with chronic lymphedema changes, mild erythema, erythematous lesions noted.    PSYCH/NEURO: pleasant and cooperative, no obvious depression or anxiety, speech and thought processing grossly intact  ASSESSMENT AND PLAN:  Discussed the following assessment and plan:  Cellulitis of leg, left  -concern for cellulitis starting in LLE. -Will start Augmentin -ID contacted to make aware.  Pt encouraged to keep f/u appt for next wk. - Plan: amoxicillin-clavulanate (AUGMENTIN) 500-125 MG table -given precautions  Cellulitis of right lower extremity -improving -3 day course of zyvox completed for a 14 day total abx course. -continue regular  dressing changes.  Keep area clean and dry. -continue elevation  Chronic combined systolic and diastolic heart failure (HCC) -stable -continue to monitor wt. -continue current meds. -continue HH, PT -f/u with Cardiology     I discussed the assessment and treatment plan with the patient. The patient was provided an opportunity to ask questions and all were answered. The patient agreed with the plan and demonstrated an understanding of the instructions.   The patient was advised to call back or seek an in-person evaluation if the symptoms worsen or if the condition fails to improve as anticipated.  [name], MD

## 2019-04-17 NOTE — Patient Instructions (Addendum)
Description    Called and spoke to pt and updated him on lab INR. Instructed him to hold coumadin until INR is checked on 04/21/2019. Recheck INR on 04/21/2019. Call with any concerns any or new medications or if scheduled for any procedures  [phone]   Normal dose:10mg  daily except 15mg  on Sundays and Wednesdays.

## 2019-04-18 ENCOUNTER — Telehealth: Payer: Self-pay | Admitting: Physician Assistant

## 2019-04-18 ENCOUNTER — Telehealth: Payer: Self-pay

## 2019-04-18 DIAGNOSIS — Z4800 Encounter for change or removal of nonsurgical wound dressing: Secondary | ICD-10-CM | POA: Diagnosis not present

## 2019-04-18 DIAGNOSIS — N390 Urinary tract infection, site not specified: Secondary | ICD-10-CM | POA: Diagnosis not present

## 2019-04-18 DIAGNOSIS — L03115 Cellulitis of right lower limb: Secondary | ICD-10-CM | POA: Diagnosis not present

## 2019-04-18 DIAGNOSIS — L139 Bullous disorder, unspecified: Secondary | ICD-10-CM | POA: Diagnosis not present

## 2019-04-18 DIAGNOSIS — I890 Lymphedema, not elsewhere classified: Secondary | ICD-10-CM | POA: Diagnosis not present

## 2019-04-18 DIAGNOSIS — B954 Other streptococcus as the cause of diseases classified elsewhere: Secondary | ICD-10-CM | POA: Diagnosis not present

## 2019-04-18 LAB — BASIC METABOLIC PANEL
BUN/Creatinine Ratio: 27 — ABNORMAL HIGH (ref 10–24)
BUN: 56 mg/dL — ABNORMAL HIGH (ref 8–27)
CO2: 24 mmol/L (ref 20–29)
Calcium: 8.6 mg/dL (ref 8.6–10.2)
Chloride: 90 mmol/L — ABNORMAL LOW (ref 96–106)
Creatinine, Ser: 2.07 mg/dL — ABNORMAL HIGH (ref 0.76–1.27)
GFR calc Af Amer: 36 mL/min/{1.73_m2} — ABNORMAL LOW (ref >59)
GFR calc non Af Amer: 31 mL/min/{1.73_m2} — ABNORMAL LOW (ref >59)
Glucose: 126 mg/dL — ABNORMAL HIGH (ref 65–99)
Potassium: 3.4 mmol/L — ABNORMAL LOW (ref 3.5–5.2)
Sodium: 134 mmol/L (ref 134–144)

## 2019-04-18 LAB — MAGNESIUM: Magnesium: 2.1 mg/dL (ref 1.6–2.3)

## 2019-04-18 NOTE — Telephone Encounter (Signed)
The pt states the day he sent the transmission was sent his potassium was high and he wanted to make sure he did not have any arrhythmias. I told him according to the note on that transmission we did not get any new alerts.   The pt also states he is getting low on his Torsemide and wanting to know if he can get a refill to get some. I told him I will send this phone note to dr. Olin Pia nurse. I told him Dr. [name] is not in the office today and was not sure if his nurse was but I will send the note to them. The pt thanked me for my help.

## 2019-04-18 NOTE — Telephone Encounter (Signed)
spoke to the patient regarding his labs  Will have him resume his Kdur at 1/2 his dose 26meq BID and repeat his BMET in a week  Coumadin clinic addressed his INR, he denies any bleeding or signs of bleeiding  He is due to see Dr. [name] next month, will plan to keep that in place, he should have seen ID by then.    Tommye Standard, PA-C

## 2019-04-21 ENCOUNTER — Encounter: Payer: Self-pay | Admitting: Infectious Disease

## 2019-04-21 ENCOUNTER — Other Ambulatory Visit: Payer: Self-pay

## 2019-04-21 ENCOUNTER — Other Ambulatory Visit: Payer: Self-pay | Admitting: *Deleted

## 2019-04-21 ENCOUNTER — Ambulatory Visit (INDEPENDENT_AMBULATORY_CARE_PROVIDER_SITE_OTHER): Payer: Medicare Other | Admitting: Infectious Disease

## 2019-04-21 ENCOUNTER — Ambulatory Visit (INDEPENDENT_AMBULATORY_CARE_PROVIDER_SITE_OTHER): Payer: Medicare Other | Admitting: *Deleted

## 2019-04-21 DIAGNOSIS — Z9889 Other specified postprocedural states: Secondary | ICD-10-CM

## 2019-04-21 DIAGNOSIS — R7981 Abnormal blood-gas level: Secondary | ICD-10-CM | POA: Diagnosis not present

## 2019-04-21 DIAGNOSIS — G459 Transient cerebral ischemic attack, unspecified: Secondary | ICD-10-CM | POA: Diagnosis not present

## 2019-04-21 DIAGNOSIS — L03115 Cellulitis of right lower limb: Secondary | ICD-10-CM

## 2019-04-21 DIAGNOSIS — I4891 Unspecified atrial fibrillation: Secondary | ICD-10-CM

## 2019-04-21 DIAGNOSIS — Z950 Presence of cardiac pacemaker: Secondary | ICD-10-CM

## 2019-04-21 DIAGNOSIS — I421 Obstructive hypertrophic cardiomyopathy: Secondary | ICD-10-CM | POA: Diagnosis not present

## 2019-04-21 DIAGNOSIS — Z5181 Encounter for therapeutic drug level monitoring: Secondary | ICD-10-CM

## 2019-04-21 DIAGNOSIS — L03116 Cellulitis of left lower limb: Secondary | ICD-10-CM | POA: Diagnosis not present

## 2019-04-21 DIAGNOSIS — Z952 Presence of prosthetic heart valve: Secondary | ICD-10-CM

## 2019-04-21 DIAGNOSIS — I442 Atrioventricular block, complete: Secondary | ICD-10-CM | POA: Diagnosis not present

## 2019-04-21 DIAGNOSIS — Z79899 Other long term (current) drug therapy: Secondary | ICD-10-CM

## 2019-04-21 DIAGNOSIS — R7881 Bacteremia: Secondary | ICD-10-CM | POA: Diagnosis not present

## 2019-04-21 DIAGNOSIS — B954 Other streptococcus as the cause of diseases classified elsewhere: Secondary | ICD-10-CM

## 2019-04-21 LAB — POCT INR: INR: 2.1 (ref 2.0–3.0)

## 2019-04-21 MED ORDER — AMOXICILLIN-POT CLAVULANATE 875-125 MG PO TABS: 1.0000 | ORAL_TABLET | Freq: Two times a day (BID) | ORAL | 2 refills | Status: DC

## 2019-04-21 MED ORDER — TORSEMIDE 20 MG PO TABS: 60.0000 mg | ORAL_TABLET | Freq: Two times a day (BID) | ORAL | 3 refills | Status: DC

## 2019-04-21 MED ORDER — POTASSIUM CHLORIDE CRYS ER 10 MEQ PO TBCR: 20.0000 meq | EXTENDED_RELEASE_TABLET | Freq: Two times a day (BID) | ORAL | Status: AC

## 2019-04-21 NOTE — Progress Notes (Signed)
Subjective:   Chief complaint: Worsening erythema on the left leg   Patient ID: Frank [name], male    DOB: 1945/05/24, 74 y.o.   MRN: [id number]  HPI  Frank [name] is a 74 y.o. male with  MVR/AVR Pacemaker, cellulitis and group G streptococcal bacteremia.  Fortunately he did not  have any overt vegetations on his pacemaker wires or his mechanical or native valves.  Continued him on IV penicillin with a goal of completing 2 weeks of therapy.  Towards the end of his therapy IV issues ensued and he was prescribed Zyvox.  Unfortunately he was discharged at a moment when the transition of care pharmacy had been closed and so he never received the Zyvox upon discharge from the hospital but was able to get it roughly 12 hours later.  He completed 3 doses.  Since then he has been followed by home health with dressing changes to his right lower extremity which has largely been improving although at times there is a dark-ish color material that is developing on the outside of the wound which disappears with removal of wraps.  In the interim he developed erythema in his contralateral leg and was seen by the primary care physician virtually who prescribed Augmentin after talking to me.  Patient seems to be doing relatively stably at present and I would like him to continue on the Augmentin.  He does have some missing teeth and has not been to the dentist for a while and I think from a standpoint of protecting his heart valves his teeth should be looked at carefully and any infected or potentially infected areas dealt with.  Past Medical History:  Diagnosis Date  . Atrial arrhythmia    status post ablation in [place] with complication including damage to the aortic valve  . Atrial fibrillation ([place])   . AV block, 1st degree   . Bradycardia   . Cluster headache ~ 1980   "went away after I started taking one of the heart RXs" (06/29/2017)  . Diastolic CHF, chronic ([place])   . Diverticulosis   .  History of gout   . HTN (hypertension)   . Hypertrophic cardiomyopathy ([place])    s/p myoectomy  . Infection   . Internal hemorrhoids   . Morbid obesity ([place])   . Morbid obesity with BMI of 45.0-49.9, adult ([place])   . Pacemaker  -SJM   . Skin cancer    "burned off RUE X 1; cut off nose X 2" (06/29/2017)  . TIA (transient ischemic attack) 2001  . Tubulovillous adenoma     Past Surgical History:  Procedure Laterality Date  . 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 BIV PACEMAKER INSERTION CRT-P N/A 07/27/2017   Procedure: BIV PACEMAKER INSERTION CRT-P;  Surgeon: [name], MD;  Location: [facility];  Service: Cardiovascular;  Laterality: N/A;  . BIV UPGRADE N/A 05/28/2017   Procedure: BIV [place];  Surgeon: [name], MD;  Location: [facility];  Service: Cardiovascular;  Laterality: N/A;  . CARDIAC CATHETERIZATION     "couple times" (06/29/2017)  . CARDIAC VALVE REPLACEMENT    . COLONOSCOPY    . COLONOSCOPY WITH PROPOFOL N/A 04/13/2016   Procedure: COLONOSCOPY WITH PROPOFOL;  Surgeon: Gatha Mayer, MD;  [facility];  Service: Endoscopy;  Laterality: N/A;  . COLONOSCOPY WITH PROPOFOL N/A 02/28/2019   Procedure: COLONOSCOPY WITH PROPOFOL;  Surgeon: Silvano Rusk  E, MD;  [facility];  Service: Endoscopy;  Laterality: N/A;  . INSERT / REPLACE / REMOVE PACEMAKER  ~ [address] Jude [employer]   . 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 Jude  . PACEMAKER LEAD REMOVAL N/A 06/29/2017   Procedure: [id number]  REMOVAL AND Four LEAD REMOVAL;  Surgeon: Evans Lance, MD;  Location: [facility];  Service: Cardiovascular;  Laterality: N/A;  . PACEMAKER REMOVAL  06/29/2017   GERNERATOR  REMOVAL AND Four LEAD REMOVAL; put in external pacemaker (06/29/2017)  . septal myectomy  07/22/1997  . SKIN CANCER EXCISION    . TEE WITHOUT CARDIOVERSION N/A 03/21/2019   Procedure: TRANSESOPHAGEAL  ECHOCARDIOGRAM (TEE);  Surgeon: Jerline Pain, MD;  Location: [facility] END[facility]COPY;  Service: Cardiovascular;  Laterality: N/A;    Family History  Problem Relation Age of Onset  . Heart disease Sister   . Uterine cancer Sister   . Obesity Sister        500+ lbs  . Heart disease Brother   . Heart disease Sister   . Heart disease Father   . Colon cancer Father 39  . Crohn's disease Paternal Grandmother       Social History   Socioeconomic History  . Marital status: Married    Spouse name: Not on file  . Number of children: 1  . Years of education: Not on file  . Highest education level: Not on file  Occupational History  . Occupation: Corporate investment banker, retired    Fish farm manager: [employer]  Tobacco Use  . Smoking status: Former Smoker    Years: 10.00    Types: Cigarettes, Pipe, Cigars    Quit date: 04/10/1977    Years since quitting: 42.0  . Smokeless tobacco: Never Used  Substance and Sexual Activity  . Alcohol use: Yes    Alcohol/week: 2.0 standard drinks    Types: 1 Glasses of wine, 1 Cans of beer per week  . Drug use: No  . Sexual activity: Not Currently  Other Topics Concern  . Not on file  Social History Narrative   Married, one adopted son and one daughter. 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   2 caffeinated beverages daily   Social Determinants of Health   Financial Resource Strain:   . Difficulty of Paying Living Expenses: Not on file  Food Insecurity: No Food Insecurity  . Worried About Charity fundraiser in the Last Year: Never true  . Ran Out of Food in the Last Year: Never true  Transportation Needs: No Transportation Needs  . Lack of Transportation (Medical): No  . Lack of Transportation (Non-Medical): No  Physical Activity:   . Days of Exercise per Week: Not on file  . Minutes of Exercise per Session: Not on file  Stress:   . Feeling of Stress : Not on file  Social Connections:   . Frequency of  Communication with Friends and Family: Not on file  . Frequency of Social Gatherings with Friends and Family: Not on file  . Attends Religious Services: Not on file  . Active Member of Clubs or Organizations: Not on file  . Attends Archivist Meetings: Not on file  . Marital Status: Not on file    Allergies  Allergen Reactions  . Warfarin And Related Other (See Comments)    COUMADIN or JANTOVEN BRAND ONLY** Per pt, was switched to generic in 2001 and had  significantly decreased absorption > INR subtherapeutic> pt suffered a TIA and has been on brand name only since      Current Outpatient Medications:  .  acetaminophen (TYLENOL) 325 MG tablet, Take 2 tablets (650 mg total) by mouth every 8 (eight) hours as needed for mild pain or fever., Disp: , Rfl:  .  Amino Acids-Protein Hydrolys (FEEDING SUPPLEMENT, PRO-STAT SUGAR FREE 64,) LIQD, Take 30 mLs by mouth 2 (two) times daily., Disp: 887 mL, Rfl: 0 .  amoxicillin-clavulanate (AUGMENTIN) 500-125 MG tablet, Take 1 tablet (500 mg total) by mouth 2 (two) times daily for 10 days., Disp: 20 tablet, Rfl: 0 .  Ascorbic Acid (VITAMIN C) 1000 MG tablet, Take 2,000 mg by mouth daily.  , Disp: , Rfl:  .  aspirin 81 MG tablet, Take 81 mg by mouth daily., Disp: , Rfl:  .  diphenhydramine-acetaminophen (TYLENOL PM) 25-500 MG TABS tablet, Take 2 tablets by mouth at bedtime as needed (for sleep)., Disp: , Rfl:  .  Ensure Max Protein (ENSURE MAX PROTEIN) LIQD, Take 330 mLs (11 oz total) by mouth daily., Disp: 330 mL, Rfl: 12 .  ferrous sulfate 325 (65 FE) MG tablet, Take 325 mg by mouth daily with breakfast., Disp: , Rfl:  .  fish oil-omega-3 fatty acids 1000 MG capsule, Take 4 g by mouth every morning. , Disp: , Rfl:  .  HYDROcodone-acetaminophen (NORCO/VICODIN) 5-325 MG tablet, Take 1 tablet by mouth every 8 (eight) hours as needed for moderate pain or severe pain., Disp: 24 tablet, Rfl: 0 .  JANTOVEN 10 MG tablet, Take 0.5 tablets (5 mg total) by  mouth daily at 6 PM. Take above dose until follow up with your Coumadin clinic then follow their instructions for dosing., Disp: 30 tablet, Rfl: 0 .  linezolid (ZYVOX) 600 MG tablet, Take 1 tablet (600 mg total) by mouth every 12 (twelve) hours., Disp: 3 tablet, Rfl: 0 .  Magnesium 250 MG TABS, Take 500-750 mg by mouth See admin instructions. 750 mg in the morning, 500 mg in the evening, Disp: , Rfl:  .  Multiple Vitamin (MULTIVITAMIN) tablet, Take 1 tablet by mouth daily.  , Disp: , Rfl:  .  omeprazole (PRILOSEC) 20 MG capsule, Take 1 capsule (20 mg total) by mouth daily., Disp: 90 capsule, Rfl: 1 .  potassium chloride (KLOR-CON) 10 MEQ tablet, Take 4 tablets (40 mEq total) by mouth 2 (two) times daily., Disp: , Rfl:  .  torsemide (DEMADEX) 20 MG tablet, Take 3 tablets (60 mg total) by mouth 2 (two) times daily., Disp: 540 tablet, Rfl: 3 .  Wheat Dextrin (BENEFIBER) CHEW, Chew 3 tablets by mouth daily., Disp: , Rfl:    Review of Systems  Constitutional: Negative for activity change, appetite change, chills, diaphoresis, fatigue, fever and unexpected weight change.  HENT: Negative for congestion, rhinorrhea, sinus pressure, sneezing, sore throat and trouble swallowing.   Eyes: Negative for photophobia and visual disturbance.  Respiratory: Negative for cough, chest tightness, shortness of breath, wheezing and stridor.   Cardiovascular: Negative for chest pain, palpitations and leg swelling.  Gastrointestinal: Negative for abdominal distention, abdominal pain, anal bleeding, blood in stool, constipation, diarrhea, nausea and vomiting.  Genitourinary: Negative for difficulty urinating, dysuria, flank pain and hematuria.  Musculoskeletal: Negative for arthralgias, back pain, gait problem, joint swelling and myalgias.  Skin: Positive for color change and wound. Negative for pallor.  Neurological: Negative for dizziness, tremors, weakness and light-headedness.  Hematological: Negative for adenopathy.  Does not bruise/bleed easily.  Psychiatric/Behavioral: Negative for agitation, behavioral problems, confusion, decreased concentration, dysphoric mood and sleep disturbance.       Objective:   Physical Exam Constitutional:      General: He is not in acute distress.    Appearance: Normal appearance. He is well-developed. He is obese. He is not ill-appearing or diaphoretic.  HENT:     Head: Normocephalic and atraumatic.     Right Ear: Hearing and external ear normal.     Left Ear: Hearing and external ear normal.     Nose: No nasal deformity or rhinorrhea.  Eyes:     General: No scleral icterus.    Conjunctiva/sclera: Conjunctivae normal.     Right eye: Right conjunctiva is not injected.     Left eye: Left conjunctiva is not injected.     Pupils: Pupils are equal, round, and reactive to light.  Neck:     Vascular: No JVD.  Cardiovascular:     Rate and Rhythm: Normal rate and regular rhythm.     Heart sounds: S1 normal and S2 normal. No murmur. No friction rub. No gallop.   Pulmonary:     Effort: Pulmonary effort is normal. No respiratory distress.     Breath sounds: Normal breath sounds. No stridor. No wheezing or rhonchi.  Chest:     Chest wall: No tenderness.  Abdominal:     General: There is no distension.     Palpations: Abdomen is soft.  Musculoskeletal:        General: Swelling present. Normal range of motion.     Right shoulder: Normal.     Left shoulder: Normal.     Cervical back: Normal range of motion and neck supple.     Right hip: Normal.     Left hip: Normal.     Right knee: Normal.     Left knee: Normal.  Lymphadenopathy:     Head:     Right side of head: No submandibular, preauricular or posterior auricular adenopathy.     Left side of head: No submandibular, preauricular or posterior auricular adenopathy.     Cervical: No cervical adenopathy.     Right cervical: No superficial or deep cervical adenopathy.    Left cervical: No superficial or deep cervical  adenopathy.  Skin:    General: Skin is warm and dry.     Coloration: Skin is not pale.     Findings: Erythema present. No abrasion, bruising, ecchymosis, lesion or rash.     Nails: There is no clubbing.  Neurological:     General: No focal deficit present.     Mental Status: He is alert and oriented to person, place, and time.     Sensory: No sensory deficit.     Coordination: Coordination normal.     Gait: Gait normal.  Psychiatric:        Attention and Perception: He is attentive.        Mood and Affect: Mood normal.        Speech: Speech normal.        Behavior: Behavior normal. Behavior is cooperative.        Thought Content: Thought content normal.        Judgment: Judgment normal.     Right lower extremity when in the hospital March 25, 2019::    Left leg with virtual visit last Friday and right seventh 2021:  2021     Both legs today's visit April 21, 2019:  Assessment & Plan:   Recent group G streptococcal bacteremia due to cellulitis.  Fortunately his prosthetic valve native valves and pacemaker were not overtly infected.  We were going to check surveillance blood cultures but he still on antibiotics at this point in time.  We will continue oral antibiotics and once he is off them we can check some surveillance blood cultures  Cellulitis: On the right seems to be resolving left side could be a bit of cellulitis though not clear that it is I think erring on the side of treating him is prudent this did appear after he came off antibiotics.  I would like him to complete at least 2 weeks of Augmentin with option to refill again and go all the way to a month if needed.  In the future when his leg is at baseline I have told him and his wife that the patient should always have a supply of antibiotics at home on hand this could be plain old amoxicillin or Augmentin.  I wanted to have this so that at the first signs of infection in the form of cellulitis  he can initiate such antibiotics.  Problems with dentition: Of also counseled him that seeing a dentist is critical to ensure that he does not get in a dental GEN take infection that could also affect his heart valve.  I spent greater than 25 minutes with the patient including greater than 50% of time in face to face counsel of the patient and his wife regarding the nature of cellulitis recurrent cellulitis risk for his prosthetic and native valves and pacemaker becoming infected and means to maximize per prevention of such events and coordination of his care

## 2019-04-21 NOTE — Patient Instructions (Signed)
Description   Take 1.5 tablets today and then continue to take 1 tablet daily except for 1.5 tablets on Sundays and Wednesdays. Recheck INR in 1 week. Call with any concerns any or new medications or if scheduled for any procedures  [phone]

## 2019-04-22 ENCOUNTER — Encounter: Payer: Self-pay | Admitting: Family Medicine

## 2019-04-22 ENCOUNTER — Telehealth (INDEPENDENT_AMBULATORY_CARE_PROVIDER_SITE_OTHER): Payer: Medicare Other | Admitting: Family Medicine

## 2019-04-22 DIAGNOSIS — L139 Bullous disorder, unspecified: Secondary | ICD-10-CM | POA: Diagnosis not present

## 2019-04-22 DIAGNOSIS — Z4800 Encounter for change or removal of nonsurgical wound dressing: Secondary | ICD-10-CM | POA: Diagnosis not present

## 2019-04-22 DIAGNOSIS — L03116 Cellulitis of left lower limb: Secondary | ICD-10-CM | POA: Diagnosis not present

## 2019-04-22 DIAGNOSIS — R059 Cough, unspecified: Secondary | ICD-10-CM

## 2019-04-22 DIAGNOSIS — R05 Cough: Secondary | ICD-10-CM | POA: Diagnosis not present

## 2019-04-22 DIAGNOSIS — I890 Lymphedema, not elsewhere classified: Secondary | ICD-10-CM | POA: Diagnosis not present

## 2019-04-22 DIAGNOSIS — L03115 Cellulitis of right lower limb: Secondary | ICD-10-CM | POA: Diagnosis not present

## 2019-04-22 DIAGNOSIS — N390 Urinary tract infection, site not specified: Secondary | ICD-10-CM | POA: Diagnosis not present

## 2019-04-22 DIAGNOSIS — B954 Other streptococcus as the cause of diseases classified elsewhere: Secondary | ICD-10-CM | POA: Diagnosis not present

## 2019-04-22 MED ORDER — BENZONATATE 100 MG PO CAPS: 100.0000 mg | ORAL_CAPSULE | Freq: Two times a day (BID) | ORAL | 0 refills | Status: AC | PRN

## 2019-04-22 NOTE — Progress Notes (Signed)
Virtual Visit via Video Note  I connected with Frank [name] on 04/22/19 at  4:00 PM EST by a video enabled telemedicine application 2/2 [id number] pandemic and verified that I am speaking with the correct person using two identifiers.  Location patient: home Location provider:work or home office Persons participating in the virtual visit: patient, provider  I discussed the limitations of evaluation and management by telemedicine and the availability of in person appointments. The patient expressed understanding and agreed to proceed.   HPI: Pt is a 74 yo male with pmh sig for HTN, TIA, combined CHF, MVR, AVR, afib on coumadin, OSA, NCIM with St. Jude pacemaker, Pulm HTN, morbid obesity.  Pt hospitalized 12/6-12/21/20 For RLE cellulitis and grp G strep bacteremia.  Pt given IV PCN G in hospital and d/c home on 3 days of zyvox to complete a 14 d abx course.  Pt was w/o abs x 12 hours 2.2 to pharmacy being closed.  Since d/c pt on started on Augmentin 04/16/18 for concerns of cellulitis in LLE after consultation with ID, Dr. Tommy Medal.  At ID f/u 04/20/18, pt advised to complete at least a 14 day course of Augmentin with possibility of continuing for 1 month.   Recommendations also included pt have a supply of Augmentin to be used at the first sign of infection.  Pt now with intermittent cough.  Intense coughing spells started yesterday while leaving ID clinic appt.  Pt denies increased SOB or LE edema, fever (temp 97.32F), sore throat, loss of taste or smell.  Cough at times sounds "wet".  Pt taking Augmentin as prescribed for cellulitis. Pt's wife is not sick, but is scheduled to return to work in the school system on 05/01/18.  ROS: See pertinent positives and negatives per HPI.  Past Medical History:  Diagnosis Date  . Atrial arrhythmia    status post ablation in [place] with complication including damage to the aortic valve  . Atrial fibrillation ([place])   . AV block, 1st degree   . Bradycardia   .  Cluster headache ~ 1980   "went away after I started taking one of the heart RXs" (06/29/2017)  . Diastolic CHF, chronic ([place])   . Diverticulosis   . History of gout   . HTN (hypertension)   . Hypertrophic cardiomyopathy ([place])    s/p myoectomy  . Infection   . Internal hemorrhoids   . Morbid obesity ([place])   . Morbid obesity with BMI of 45.0-49.9, adult ([place])   . Pacemaker  -SJM   . Skin cancer    "burned off RUE X 1; cut off nose X 2" (06/29/2017)  . TIA (transient ischemic attack) 2001  . Tubulovillous adenoma     Past Surgical History:  Procedure Laterality Date  . 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 BIV PACEMAKER INSERTION CRT-P N/A 07/27/2017   Procedure: BIV PACEMAKER INSERTION CRT-P;  Surgeon: [name], MD;  Location: [facility];  Service: Cardiovascular;  Laterality: N/A;  . BIV UPGRADE N/A 05/28/2017   Procedure: BIV [place];  Surgeon: [name], MD;  Location: [facility];  Service: Cardiovascular;  Laterality: N/A;  . CARDIAC CATHETERIZATION     "couple times" (06/29/2017)  . CARDIAC VALVE REPLACEMENT    . COLONOSCOPY    . COLONOSCOPY WITH PROPOFOL N/A 04/13/2016   Procedure: COLONOSCOPY WITH PROPOFOL;  Surgeon: Gatha Mayer, MD;  Location: [facility];  Service:  Endoscopy;  Laterality: N/A;  . COLONOSCOPY WITH PROPOFOL N/A 02/28/2019   Procedure: COLONOSCOPY WITH PROPOFOL;  Surgeon: Gatha Mayer, MD;  Location: [facility];  Service: Endoscopy;  Laterality: N/A;  . INSERT / REPLACE / REMOVE PACEMAKER  ~ [address] Jude [employer]   . 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 Jude  . PACEMAKER LEAD REMOVAL N/A 06/29/2017   Procedure: [id number]  REMOVAL AND Four LEAD REMOVAL;  Surgeon: Evans Lance, MD;  Location: [facility];  Service: Cardiovascular;  Laterality: N/A;  . PACEMAKER REMOVAL  06/29/2017   GERNERATOR  REMOVAL AND Four LEAD REMOVAL; put in external  pacemaker (06/29/2017)  . septal myectomy  07/22/1997  . SKIN CANCER EXCISION    . TEE WITHOUT CARDIOVERSION N/A 03/21/2019   Procedure: TRANSESOPHAGEAL ECHOCARDIOGRAM (TEE);  Surgeon: Jerline Pain, MD;  Location: [facility];  Service: Cardiovascular;  Laterality: N/A;    Family History  Problem Relation Age of Onset  . Heart disease Sister   . Uterine cancer Sister   . Obesity Sister        500+ lbs  . Heart disease Brother   . Heart disease Sister   . Heart disease Father   . Colon cancer Father 80  . Crohn's disease Paternal Grandmother      Current Outpatient Medications:  .  acetaminophen (TYLENOL) 325 MG tablet, Take 2 tablets (650 mg total) by mouth every 8 (eight) hours as needed for mild pain or fever., Disp: , Rfl:  .  Amino Acids-Protein Hydrolys (FEEDING SUPPLEMENT, PRO-STAT SUGAR FREE 64,) LIQD, Take 30 mLs by mouth 2 (two) times daily., Disp: 887 mL, Rfl: 0 .  amoxicillin-clavulanate (AUGMENTIN) 875-125 MG tablet, Take 1 tablet by mouth 2 (two) times daily for 14 days., Disp: 28 tablet, Rfl: 2 .  Ascorbic Acid (VITAMIN C) 1000 MG tablet, Take 2,000 mg by mouth daily.  , Disp: , Rfl:  .  aspirin 81 MG tablet, Take 81 mg by mouth daily., Disp: , Rfl:  .  diphenhydramine-acetaminophen (TYLENOL PM) 25-500 MG TABS tablet, Take 2 tablets by mouth at bedtime as needed (for sleep)., Disp: , Rfl:  .  Ensure Max Protein (ENSURE MAX PROTEIN) LIQD, Take 330 mLs (11 oz total) by mouth daily., Disp: 330 mL, Rfl: 12 .  ferrous sulfate 325 (65 FE) MG tablet, Take 325 mg by mouth daily with breakfast., Disp: , Rfl:  .  fish oil-omega-3 fatty acids 1000 MG capsule, Take 4 g by mouth every morning. , Disp: , Rfl:  .  HYDROcodone-acetaminophen (NORCO/VICODIN) 5-325 MG tablet, Take 1 tablet by mouth every 8 (eight) hours as needed for moderate pain or severe pain., Disp: 24 tablet, Rfl: 0 .  JANTOVEN 10 MG tablet, Take 0.5 tablets (5 mg total) by mouth daily at 6 PM. Take above dose until  follow up with your Coumadin clinic then follow their instructions for dosing., Disp: 30 tablet, Rfl: 0 .  Magnesium 250 MG TABS, Take 500-750 mg by mouth See admin instructions. 750 mg in the morning, 500 mg in the evening, Disp: , Rfl:  .  Multiple Vitamin (MULTIVITAMIN) tablet, Take 1 tablet by mouth daily.  , Disp: , Rfl:  .  omeprazole (PRILOSEC) 20 MG capsule, Take 1 capsule (20 mg total) by mouth daily., Disp: 90 capsule, Rfl: 1 .  potassium chloride (KLOR-CON) 10 MEQ tablet, Take 2 tablets (20 mEq total) by mouth 2 (two) times daily., Disp: , Rfl:  .  torsemide (DEMADEX) 20 MG tablet, Take 3 tablets (60 mg total) by mouth 2 (two) times daily. Take an extra tablet as needed, Disp: 560 tablet, Rfl: 3 .  Wheat Dextrin (BENEFIBER) CHEW, Chew 3 tablets by mouth daily., Disp: , Rfl:   EXAM:  VITALS per patient if applicable: T 16.1W    GENERAL: alert, oriented, appears well and in no acute distress  HEENT: atraumatic, conjunctiva clear, no obvious abnormalities on inspection of external nose and ears  NECK: normal movements of the head and neck  LUNGS: on inspection no signs of respiratory distress, breathing rate appears normal, no obvious gross SOB, gasping or wheezing  CV: no obvious cyanosis  MS: moves all visible extremities without noticeable abnormality  PSYCH/NEURO: pleasant and cooperative, no obvious depression or anxiety, speech and thought processing grossly intact  ASSESSMENT AND PLAN:  Discussed the following assessment and plan:  Cough  -discussed possible causes including viral illness, CHF exacerbation, pneumonia. -Pt advised to monitor for wt gain and other symptoms -consider COVID testing in next few days for continued symptoms -continue Augmentin which would cover respiratory infections, however given continued Augmentin course and new onset cough discussed CXR. -Consider UC or respiratory clinic to obtain CXR to r/o pneumonia or CHF exacerbation. -given  precautions - Plan: benzonatate (TESSALON) 100 MG capsule  Cellulitis of both lower extremities -improving -continue Augmentin BID -given precautions  F/u in the next 1-2 days.   I discussed the assessment and treatment plan with the patient. The patient was provided an opportunity to ask questions and all were answered. The patient agreed with the plan and demonstrated an understanding of the instructions.   The patient was advised to call back or seek an in-person evaluation if the symptoms worsen or if the condition fails to improve as anticipated.   [name], MD

## 2019-04-22 NOTE — Telephone Encounter (Signed)
Pt scheduled for a virtual visit this afternoon at 4 pm

## 2019-04-23 ENCOUNTER — Other Ambulatory Visit: Payer: Self-pay | Admitting: *Deleted

## 2019-04-23 ENCOUNTER — Encounter: Payer: Self-pay | Admitting: *Deleted

## 2019-04-23 NOTE — Patient Outreach (Signed)
[facility]) [place] Telephone Outreach PCP office completes Transition of Care follow up post-hospital discharge Post-hospital discharge day # 23 without inpatient hospital readmission  04/23/2019  NYAN DUFRESNE 1945-08-02 [id number]  Successful telephone outreach attempt to [name], 74 y/o male referred to [facility] 04/07/2019 by [facility] CMA after EMMI Red Alert for General discharge. Patient had recent hospitalization December 6-21, 2020 for weakness/ AKI, pulmonary edema due to sepsis thought to be related to (R) lower extremity cellulitis. Patient was discharged home to self-care with home health services for PT and RN in place through [facility].Patient has history including, but not limited to, combined CHF with cardiomyopathy; previous MVR/ AVR with pacemaker; HTN; CKD- III; A-Fib on ACT; and OSA.  HIPAA/ identity verified with patient during phone call today, and he reports that "things are finally settling down," and that he believes he "is getting better slowly every day."  Denies pain during our phone call today and denies new/ recent falls; reports continues using walker for ambulation.  Patient sounds to be in no distress throughout phone call today.  Patient further reports:  Medications: --  has all medicationsand takes all as prescribed;denies questions/ concerns around current medications; continues elf- managing medications, uses weekly pill box -- Patient was recently discharged from hospital and all medications were thoroughly reviewed with patient during phone call today- no concerns were identified.  Continues to verbalize good general understanding of the purpose, dosing, and scheduling of medications  Medications Reviewed Today    Reviewed by Knox Royalty, RN (Registered Nurse) on 04/23/19 at 1246  Med List Status: <None>  Medication Order Taking? Sig Documenting Provider Last Dose Status Informant   acetaminophen (TYLENOL) 325 MG tablet [id number]  Take 2 tablets (650 mg total) by mouth every 8 (eight) hours as needed for mild pain or fever. Modena Jansky, MD  Active   Amino Acids-Protein Hydrolys (FEEDING SUPPLEMENT, PRO-STAT SUGAR FREE 64,) LIQD [id number]  Take 30 mLs by mouth 2 (two) times daily. Modena Jansky, MD  Active   amoxicillin-clavulanate (AUGMENTIN) 875-125 MG tablet [id number]  Take 1 tablet by mouth 2 (two) times daily for 14 days. Tommy Medal, Lavell Islam, MD  Active   Ascorbic Acid (VITAMIN C) 1000 MG tablet [id number]  Take 2,000 mg by mouth daily.   [provider]  Active Spouse/Significant Other  aspirin 81 MG tablet [id number]  Take 81 mg by mouth daily. [provider]  Active Spouse/Significant Other  benzonatate (TESSALON) 100 MG capsule [id number]  Take 1 capsule (100 mg total) by mouth 2 (two) times daily as needed for cough. [name], MD  Active   diphenhydramine-acetaminophen (TYLENOL PM) 25-500 MG TABS tablet [id number]  Take 2 tablets by mouth at bedtime as needed (for sleep). [provider]  Active Spouse/Significant Other        Discontinued 03/16/19 2337   Ensure Max Protein (ENSURE MAX PROTEIN) LIQD [id number]  Take 330 mLs (11 oz total) by mouth daily. Modena Jansky, MD  Active   ferrous sulfate 325 (65 FE) MG tablet [id number]  Take 325 mg by mouth daily with breakfast. [provider]  Active Spouse/Significant Other  fish oil-omega-3 fatty acids 1000 MG capsule [id number]  Take 4 g by mouth every morning.  [provider]  Active Spouse/Significant Other        Discontinued 03/16/19 2337   HYDROcodone-acetaminophen (NORCO/VICODIN) 5-325 MG tablet [id number]  Take 1 tablet  by mouth every 8 (eight) hours as needed for moderate pain or severe pain. [name], MD  Active   JANTOVEN 10 MG tablet [id number]  Take 0.5 tablets (5 mg total) by mouth daily at 6 PM. Take above dose until follow up with your  Coumadin clinic then follow their instructions for dosing. Modena Jansky, MD  Active   Magnesium 250 MG TABS [id number]  Take 500-750 mg by mouth See admin instructions. 750 mg in the morning, 500 mg in the evening [provider]  Active Spouse/Significant Other  Multiple Vitamin (MULTIVITAMIN) tablet [id number]  Take 1 tablet by mouth daily.   [provider]  Active Spouse/Significant Other  omeprazole (PRILOSEC) 20 MG capsule [id number]  Take 1 capsule (20 mg total) by mouth daily. [name], MD  Active   potassium chloride (KLOR-CON) 10 MEQ tablet [id number]  Take 2 tablets (20 mEq total) by mouth 2 (two) times daily. [place], PA-C  Active   torsemide (DEMADEX) 20 MG tablet [id number]  Take 3 tablets (60 mg total) by mouth 2 (two) times daily. Take an extra tablet as needed [name], MD  Active   Wheat Dextrin [facility]) CHEW [id number]  Chew 3 tablets by mouth daily. [provider]  Active Spouse/Significant Other         Home health [facility]) services: -- [place] services for PT, OT, RN in place through [facility]- has had visits from PT and RN both yesterday; reports tolerating "well." -- patient's ongoing active participation in home health services was again encouraged and I briefly discussed with him process to extend services if indicated  Provider appointments: -- All recent and upcoming provider appointments were reviewed with patient today; patient verbalizes accurate understanding of all and will continue to be transported to appointments by spouse  Self-health management of chronic disease state of CHF- cardiomyopathy/ OSA/ A-Fib : -- patient has resumed daily weight monitoring/ recording at home and verbalizes a good understanding of the purpose of daily weight monitoring; reviewed recent weights at home and he reports consistent weight ranges between "322- 326 lbs;" reviewed with patient weight gain guidelines in setting of CHF, and he  verbalizes understanding of same- positive reinforcement provided for resuming daily weight monitoring at home -- reports recent development of new cough; states he contacted his PCP who prescribed cough medication- obtained new medication and is taking as prescribed; reports "helping."  Encouraged patient to keep PCP abreast if cough does not improve or if it worsens- he verbalizes agreement; denies signs/ symptoms corona virus and is able to verbalize signs/ symptoms to report -- denies clinical concerns around breathing status, states leg "looks better every day."  Patient denies further issues, concerns, or problems today. I confirmed that patient hasmy direct phone number, the main [facility] CM office phone number, and the [facility] CM 24-hour nurse advice phone number should issues arise prior to next scheduled [place] outreach within 2-3 weeks.  Encouraged patient to contact me directly if needs, questions, issues, or concerns arise prior to next scheduled outreach; patient agreed to do so.  Plan:  Patient will take medications as prescribed and will attend all scheduled provider appointments  Patient will promptly notify care providers for any new concerns/ issues/ problems that arise  Patient will actively participate in home health services as ordered post-hospital discharge  Patient will continue monitoring/ recording daily weights on days that home health PT is present in home  I will share today's  [facility] CM notes/ care plan/ and medication list with patient's PCP as [facility] CM initial assessment  [facility] CM outreach to continue with scheduled phone call in 2- 3 weeks unless indicated sooner  [facility] CM Care Plan Problem One     Most Recent Value  Care Plan Problem One  High Risk for hospital readmission related to/ as evidenced by recent hospitalization December 6-21, 2020 for sepsis related to lower extremity cellulits  Role Documenting the Problem One  Care Management  [place] for Problem One  Active  [facility] Long Term Goal   Over the next 31 days, patient will not experience hospital readmission, as evidenced by patient reporting and review of EMR during [facility] RN CM outreach  [facility] Long Term Goal Start Date  04/09/19  Interventions for Problem One Long Term Goal  Discussed with patient current clinical condition and confirmed that he does not have any current clinical concerns and believes that he is doing better,  discussed appearance of his leg and confirmed that his leg has no signs/ symptoms ongoing infection,  completed [facility] CM initial assessment and post-hospital discharge medication review and sent initial assessment/ care plan and medication list to PCP  [facility] CM Short Term Goal #1   Over the next 30 days, patient will attend all scheduled provider office visits as evidenced by patient reporting and review of EMR/ collaboration with PCP team as indicated during [facility] RN CM outreach  [facility] CM Short Term Goal #1 Start Date  04/09/19  Interventions for Short Term Goal #1  Reviewed with patient recent and upcoming provider appointments and confirmed that he has reliable transportation to provider appointments through family members  [facility] CM Short Term Goal #2   Over the next 30 days, patient will actively participate with home health services as ordered post-hospital discharge, as evidenced by patient reporting and collaboration with home health team as indicated during [facility] RN CM outreach  [facility] CM Short Term Goal #2 Start Date  04/09/19  Interventions for Short Term Goal #2  Confirmed with patient that he has continued actively participating in home health services as ordered post- hospital discharge and reviewed recent home health visits with patient,  encouraged patient's ongoing active participation in home health services and discussed with him process for extension of these services, if indicated     Oneta Rack, RN, BSN, [employer]  Coordinator [facility] Care Management  [phone]

## 2019-04-25 DIAGNOSIS — Z4800 Encounter for change or removal of nonsurgical wound dressing: Secondary | ICD-10-CM | POA: Diagnosis not present

## 2019-04-25 DIAGNOSIS — N390 Urinary tract infection, site not specified: Secondary | ICD-10-CM | POA: Diagnosis not present

## 2019-04-25 DIAGNOSIS — B954 Other streptococcus as the cause of diseases classified elsewhere: Secondary | ICD-10-CM | POA: Diagnosis not present

## 2019-04-25 DIAGNOSIS — L03115 Cellulitis of right lower limb: Secondary | ICD-10-CM | POA: Diagnosis not present

## 2019-04-25 DIAGNOSIS — I890 Lymphedema, not elsewhere classified: Secondary | ICD-10-CM | POA: Diagnosis not present

## 2019-04-25 DIAGNOSIS — L139 Bullous disorder, unspecified: Secondary | ICD-10-CM | POA: Diagnosis not present

## 2019-04-28 ENCOUNTER — Other Ambulatory Visit: Payer: Self-pay

## 2019-04-28 ENCOUNTER — Ambulatory Visit (INDEPENDENT_AMBULATORY_CARE_PROVIDER_SITE_OTHER): Payer: Medicare Other | Admitting: Pharmacist

## 2019-04-28 ENCOUNTER — Other Ambulatory Visit: Payer: Medicare Other

## 2019-04-28 DIAGNOSIS — Z5181 Encounter for therapeutic drug level monitoring: Secondary | ICD-10-CM

## 2019-04-28 DIAGNOSIS — I4891 Unspecified atrial fibrillation: Secondary | ICD-10-CM

## 2019-04-28 DIAGNOSIS — Z9889 Other specified postprocedural states: Secondary | ICD-10-CM | POA: Diagnosis not present

## 2019-04-28 DIAGNOSIS — Z79899 Other long term (current) drug therapy: Secondary | ICD-10-CM | POA: Diagnosis not present

## 2019-04-28 DIAGNOSIS — G459 Transient cerebral ischemic attack, unspecified: Secondary | ICD-10-CM

## 2019-04-28 LAB — POCT INR: INR: 3.8 — AB (ref 2.0–3.0)

## 2019-04-28 NOTE — Patient Instructions (Signed)
Hold coumadin today and then continue to take 1 tablet daily except for 1.5 tablets on Sundays and Wednesdays. Recheck INR in 2 week. Call with any concerns any or new medications or if scheduled for any procedures  [phone]

## 2019-04-29 ENCOUNTER — Other Ambulatory Visit: Payer: Self-pay | Admitting: *Deleted

## 2019-04-29 ENCOUNTER — Encounter: Payer: Self-pay | Admitting: Family Medicine

## 2019-04-29 DIAGNOSIS — B954 Other streptococcus as the cause of diseases classified elsewhere: Secondary | ICD-10-CM | POA: Diagnosis not present

## 2019-04-29 DIAGNOSIS — N390 Urinary tract infection, site not specified: Secondary | ICD-10-CM | POA: Diagnosis not present

## 2019-04-29 DIAGNOSIS — I890 Lymphedema, not elsewhere classified: Secondary | ICD-10-CM | POA: Diagnosis not present

## 2019-04-29 DIAGNOSIS — I4891 Unspecified atrial fibrillation: Secondary | ICD-10-CM

## 2019-04-29 DIAGNOSIS — L03115 Cellulitis of right lower limb: Secondary | ICD-10-CM | POA: Diagnosis not present

## 2019-04-29 DIAGNOSIS — Z5181 Encounter for therapeutic drug level monitoring: Secondary | ICD-10-CM

## 2019-04-29 DIAGNOSIS — E875 Hyperkalemia: Secondary | ICD-10-CM

## 2019-04-29 DIAGNOSIS — Z4800 Encounter for change or removal of nonsurgical wound dressing: Secondary | ICD-10-CM | POA: Diagnosis not present

## 2019-04-29 DIAGNOSIS — L139 Bullous disorder, unspecified: Secondary | ICD-10-CM | POA: Diagnosis not present

## 2019-04-29 LAB — BASIC METABOLIC PANEL
BUN/Creatinine Ratio: 40 — ABNORMAL HIGH (ref 10–24)
BUN: 79 mg/dL (ref 8–27)
CO2: 23 mmol/L (ref 20–29)
Calcium: 9 mg/dL (ref 8.6–10.2)
Chloride: 90 mmol/L — ABNORMAL LOW (ref 96–106)
Creatinine, Ser: 1.96 mg/dL — ABNORMAL HIGH (ref 0.76–1.27)
GFR calc Af Amer: 39 mL/min/{1.73_m2} — ABNORMAL LOW (ref >59)
GFR calc non Af Amer: 33 mL/min/{1.73_m2} — ABNORMAL LOW (ref >59)
Glucose: 86 mg/dL (ref 65–99)
Potassium: 5.9 mmol/L (ref 3.5–5.2)
Sodium: 128 mmol/L — ABNORMAL LOW (ref 134–144)

## 2019-04-30 ENCOUNTER — Telehealth: Payer: Self-pay | Admitting: Pharmacist

## 2019-04-30 ENCOUNTER — Other Ambulatory Visit: Payer: Self-pay

## 2019-04-30 DIAGNOSIS — B954 Other streptococcus as the cause of diseases classified elsewhere: Secondary | ICD-10-CM

## 2019-04-30 MED ORDER — CEPHALEXIN 500 MG PO CAPS: 500.0000 mg | ORAL_CAPSULE | Freq: Four times a day (QID) | ORAL | 4 refills | Status: AC

## 2019-04-30 NOTE — Telephone Encounter (Signed)
Application for home INR monitor from [employer] was faxed to [employer] 04/30/19

## 2019-05-01 DIAGNOSIS — I442 Atrioventricular block, complete: Secondary | ICD-10-CM | POA: Diagnosis not present

## 2019-05-01 DIAGNOSIS — I130 Hypertensive heart and chronic kidney disease with heart failure and stage 1 through stage 4 chronic kidney disease, or unspecified chronic kidney disease: Secondary | ICD-10-CM | POA: Diagnosis not present

## 2019-05-01 DIAGNOSIS — M1030 Gout due to renal impairment, unspecified site: Secondary | ICD-10-CM | POA: Diagnosis not present

## 2019-05-01 DIAGNOSIS — I480 Paroxysmal atrial fibrillation: Secondary | ICD-10-CM | POA: Diagnosis not present

## 2019-05-01 DIAGNOSIS — K5790 Diverticulosis of intestine, part unspecified, without perforation or abscess without bleeding: Secondary | ICD-10-CM | POA: Diagnosis not present

## 2019-05-01 DIAGNOSIS — Z7901 Long term (current) use of anticoagulants: Secondary | ICD-10-CM | POA: Diagnosis not present

## 2019-05-01 DIAGNOSIS — Z6841 Body Mass Index (BMI) 40.0 and over, adult: Secondary | ICD-10-CM | POA: Diagnosis not present

## 2019-05-01 DIAGNOSIS — N183 Chronic kidney disease, stage 3 unspecified: Secondary | ICD-10-CM | POA: Diagnosis not present

## 2019-05-01 DIAGNOSIS — Z952 Presence of prosthetic heart valve: Secondary | ICD-10-CM | POA: Diagnosis not present

## 2019-05-01 DIAGNOSIS — D509 Iron deficiency anemia, unspecified: Secondary | ICD-10-CM | POA: Diagnosis not present

## 2019-05-01 DIAGNOSIS — Z950 Presence of cardiac pacemaker: Secondary | ICD-10-CM | POA: Diagnosis not present

## 2019-05-01 DIAGNOSIS — Z8673 Personal history of transient ischemic attack (TIA), and cerebral infarction without residual deficits: Secondary | ICD-10-CM | POA: Diagnosis not present

## 2019-05-01 DIAGNOSIS — K648 Other hemorrhoids: Secondary | ICD-10-CM | POA: Diagnosis not present

## 2019-05-01 DIAGNOSIS — Z4800 Encounter for change or removal of nonsurgical wound dressing: Secondary | ICD-10-CM | POA: Diagnosis not present

## 2019-05-01 DIAGNOSIS — Z85828 Personal history of other malignant neoplasm of skin: Secondary | ICD-10-CM | POA: Diagnosis not present

## 2019-05-01 DIAGNOSIS — I5043 Acute on chronic combined systolic (congestive) and diastolic (congestive) heart failure: Secondary | ICD-10-CM | POA: Diagnosis not present

## 2019-05-01 DIAGNOSIS — L03115 Cellulitis of right lower limb: Secondary | ICD-10-CM | POA: Diagnosis not present

## 2019-05-01 DIAGNOSIS — B954 Other streptococcus as the cause of diseases classified elsewhere: Secondary | ICD-10-CM | POA: Diagnosis not present

## 2019-05-01 DIAGNOSIS — I890 Lymphedema, not elsewhere classified: Secondary | ICD-10-CM | POA: Diagnosis not present

## 2019-05-01 DIAGNOSIS — N390 Urinary tract infection, site not specified: Secondary | ICD-10-CM | POA: Diagnosis not present

## 2019-05-01 DIAGNOSIS — L139 Bullous disorder, unspecified: Secondary | ICD-10-CM | POA: Diagnosis not present

## 2019-05-01 NOTE — Progress Notes (Signed)
Electrophysiology Office Note Date: 05/02/2019  ID:  Frank [name], DOB 1946/03/21, MRN [id number]  PCP: [name], MD Electrophysiologist: [name]  CC: Pacemaker follow-up  Frank [name] is a 74 y.o. male seen today for Dr [name].  He presents today for routine electrophysiology followup.  Since last being seen in our clinic, the patient reports increased shortness of breath and fatigue. He has had a long difficult few months.  He struggled with diarrhea while on Augmentin and is now having problems with constipation. He is off suppressive antibiotics and is being followed by ID for redo blood cultures.  His legs are improving.  He has had problems sleeping at home.  He denies chest pain, palpitations,  PND, orthopnea, nausea, vomiting, dizziness, syncope, weight gain, or early satiety.  Device History: SJM CRT-P implanted 07/27/2017  (RA/RV leads are MDT, LV lead SJM) CURRENT DEVICE 06/30/2018: Device system extraction 2/2 pocket infection, poor healing >> temp perm placement SJM dual chamber PPM implanted 07/22/2008 >> RV lead failure with new RV lead and upgrade to CRT-P 05/28/2017   Past Medical History:  Diagnosis Date  . Atrial arrhythmia    status post ablation in [place] with complication including damage to the aortic valve  . Atrial fibrillation ([place])   . AV block, 1st degree   . Bradycardia   . Cluster headache ~ 1980   "went away after I started taking one of the heart RXs" (06/29/2017)  . Diastolic CHF, chronic ([place])   . Diverticulosis   . History of gout   . HTN (hypertension)   . Hypertrophic cardiomyopathy ([place])    s/p myoectomy  . Infection   . Internal hemorrhoids   . Morbid obesity ([place])   . Morbid obesity with BMI of 45.0-49.9, adult ([place])   . Pacemaker  -SJM   . Skin cancer    "burned off RUE X 1; cut off nose X 2" (06/29/2017)  . TIA (transient ischemic attack) 2001  . Tubulovillous adenoma    Past Surgical History:  Procedure Laterality Date    . 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 BIV PACEMAKER INSERTION CRT-P N/A 07/27/2017   Procedure: BIV PACEMAKER INSERTION CRT-P;  Surgeon: [name], MD;  Location: [facility];  Service: Cardiovascular;  Laterality: N/A;  . BIV UPGRADE N/A 05/28/2017   Procedure: BIV [place];  Surgeon: [name], MD;  Location: [facility];  Service: Cardiovascular;  Laterality: N/A;  . CARDIAC CATHETERIZATION     "couple times" (06/29/2017)  . CARDIAC VALVE REPLACEMENT    . COLONOSCOPY    . COLONOSCOPY WITH PROPOFOL N/A 04/13/2016   Procedure: COLONOSCOPY WITH PROPOFOL;  Surgeon: Gatha Mayer, MD;  Location: [facility];  Service: Endoscopy;  Laterality: N/A;  . COLONOSCOPY WITH PROPOFOL N/A 02/28/2019   Procedure: COLONOSCOPY WITH PROPOFOL;  Surgeon: Gatha Mayer, MD;  Location: [facility];  Service: Endoscopy;  Laterality: N/A;  . INSERT / REPLACE / REMOVE PACEMAKER  ~ [address]   . 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  . PACEMAKER LEAD REMOVAL N/A 06/29/2017   Procedure: [id number]  REMOVAL AND Four LEAD REMOVAL;  Surgeon: Evans Lance, MD;  Location: [facility];  Service: Cardiovascular;  Laterality: N/A;  . PACEMAKER REMOVAL  06/29/2017   GERNERATOR  REMOVAL AND Four LEAD REMOVAL; put in external pacemaker (06/29/2017)  . septal  myectomy  07/22/1997  . SKIN CANCER EXCISION    . TEE WITHOUT CARDIOVERSION N/A 03/21/2019   Procedure: TRANSESOPHAGEAL ECHOCARDIOGRAM (TEE);  Surgeon: Jerline Pain, MD;  Location[facility];  Service: Cardiovascular;  Laterality: N/A;    Current Outpatient Medications  Medication Sig Dispense Refill  . acetaminophen (TYLENOL) 325 MG tablet Take 2 tablets (650 mg total) by mouth every 8 (eight) hours as needed for mild pain or fever.    . Amino Acids-Protein Hydrolys (FEEDING SUPPLEMENT, PRO-STAT SUGAR FREE 64,) LIQD Take 30 mLs by mouth 2  (two) times daily. 887 mL 0  . Ascorbic Acid (VITAMIN C) 1000 MG tablet Take 2,000 mg by mouth daily.      Marland Kitchen aspirin 81 MG tablet Take 81 mg by mouth daily.    . benzonatate (TESSALON) 100 MG capsule Take 1 capsule (100 mg total) by mouth 2 (two) times daily as needed for cough. 20 capsule 0  . cephALEXin (KEFLEX) 500 MG capsule Take 1 capsule (500 mg total) by mouth 4 (four) times daily for 10 days. 10 day course when and if he gets another flare 40 capsule 4  . diphenhydramine-acetaminophen (TYLENOL PM) 25-500 MG TABS tablet Take 2 tablets by mouth at bedtime as needed (for sleep).    . Ensure Max Protein (ENSURE MAX PROTEIN) LIQD Take 330 mLs (11 oz total) by mouth daily. 330 mL 12  . ferrous sulfate 325 (65 FE) MG tablet Take 325 mg by mouth daily with breakfast.    . fish oil-omega-3 fatty acids 1000 MG capsule Take 4 g by mouth every morning.     Marland Kitchen HYDROcodone-acetaminophen (NORCO/VICODIN) 5-325 MG tablet Take 1 tablet by mouth every 8 (eight) hours as needed for moderate pain or severe pain. 24 tablet 0  . JANTOVEN 10 MG tablet Take 0.5 tablets (5 mg total) by mouth daily at 6 PM. Take above dose until follow up with your Coumadin clinic then follow their instructions for dosing. 30 tablet 0  . Magnesium 250 MG TABS Take 500-750 mg by mouth See admin instructions. 750 mg in the morning, 500 mg in the evening    . Multiple Vitamin (MULTIVITAMIN) tablet Take 1 tablet by mouth daily.      Marland Kitchen omeprazole (PRILOSEC) 20 MG capsule Take 1 capsule (20 mg total) by mouth daily. 90 capsule 1  . potassium chloride (KLOR-CON) 10 MEQ tablet Take 2 tablets (20 mEq total) by mouth 2 (two) times daily.    Marland Kitchen torsemide (DEMADEX) 20 MG tablet Take 3 tablets (60 mg total) by mouth 2 (two) times daily. Take an extra tablet as needed 560 tablet 3  . warfarin (COUMADIN) 10 MG tablet Take 10 mg by mouth daily. Takes 10 mg po QD; extra 5 mg on Sunday and Wednesday    . Wheat Dextrin (BENEFIBER) CHEW Chew 3 tablets by  mouth daily.     No current facility-administered medications for this visit.    Allergies:   Warfarin and related   Social History: Social History   Socioeconomic History  . Marital status: Married    Spouse name: Not on file  . Number of children: 1  . Years of education: Not on file  . Highest education level: Not on file  Occupational History  . Occupation: Corporate investment banker, retired    Fish farm manager: [employer]  Tobacco Use  . Smoking status: Former Smoker    Years: 10.00    Types: Cigarettes, Pipe, Cigars    Quit  date: 04/10/1977    Years since quitting: 42.0  . Smokeless tobacco: Never Used  Substance and Sexual Activity  . Alcohol use: Yes    Alcohol/week: 2.0 standard drinks    Types: 1 Glasses of wine, 1 Cans of beer per week  . Drug use: No  . Sexual activity: Not Currently  Other Topics Concern  . Not on file  Social History Narrative   Married, one adopted son and one daughter. 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   2 caffeinated beverages daily   Social Determinants of Health   Financial Resource Strain:   . Difficulty of Paying Living Expenses: Not on file  Food Insecurity: No Food Insecurity  . Worried About Charity fundraiser in the Last Year: Never true  . Ran Out of Food in the Last Year: Never true  Transportation Needs: No Transportation Needs  . Lack of Transportation (Medical): No  . Lack of Transportation (Non-Medical): No  Physical Activity:   . Days of Exercise per Week: Not on file  . Minutes of Exercise per Session: Not on file  Stress:   . Feeling of Stress : Not on file  Social Connections:   . Frequency of Communication with Friends and Family: Not on file  . Frequency of Social Gatherings with Friends and Family: Not on file  . Attends Religious Services: Not on file  . Active Member of Clubs or Organizations: Not on file  . Attends Archivist Meetings: Not on file  .  Marital Status: Not on file  Intimate Partner Violence:   . Fear of Current or Ex-Partner: Not on file  . Emotionally Abused: Not on file  . Physically Abused: Not on file  . Sexually Abused: Not on file    Family History: Family History  Problem Relation Age of Onset  . Heart disease Sister   . Uterine cancer Sister   . Obesity Sister        500+ lbs  . Heart disease Brother   . Heart disease Sister   . Heart disease Father   . Colon cancer Father 34  . Crohn's disease Paternal Grandmother      Review of Systems: All other systems reviewed and are otherwise negative except as noted above.   Physical Exam: VS:  BP (!) 118/56   Pulse 63   Ht 5\' 11"  (1.803 m)   Wt (!) 334 lb (151.5 kg)   SpO2 97%   BMI 46.58 kg/m  , BMI Body mass index is 46.58 kg/m.  GEN- The patient is elderly, obese, and chronically ill appearing, alert and oriented x 3 today.   HEENT: normocephalic, atraumatic; sclera clear, conjunctiva pink; hearing intact; oropharynx clear; neck supple  Lungs- Clear to ausculation bilaterally, normal work of breathing.  No wheezes, rales, rhonchi Heart- Regular rate and rhythm  GI- obese, non-tender, non-distended, bowel sounds present  Extremities- no clubbing, cyanosis, +legs wrapped  MS- no significant deformity or atrophy Skin- warm and dry, no rash or lesion; PPM pocket well healed Psych- euthymic mood, full affect Neuro- strength and sensation are intact  PPM Interrogation- reviewed in detail today,  See PACEART report  EKG:  EKG is not ordered today.  Recent Labs: 03/16/2019: B Natriuretic Peptide 105.7 03/29/2019: ALT 16 03/31/2019: Hemoglobin 8.0; Platelets 380 04/17/2019: Magnesium 2.1 04/28/2019: BUN 79; Creatinine, Ser 1.96; Potassium 5.9; Sodium 128   Wt Readings from Last 3 Encounters:  05/02/19 Marland Kitchen)  334 lb (151.5 kg)  04/14/19 (!) 328 lb (148.8 kg)  03/31/19 (!) 337 lb 4.9 oz (153 kg)     Other studies Reviewed: Additional studies/  records that were reviewed today include: Renee's notes, hospital records   Assessment and Plan:  1.  Complete heart block Normal PPM function by recent interrogation ID note reviewed 04/21/19 - plan to repeat blood cultures once off antibiotics (still being treated for cellulitis)  2.  Chronic systolic heart failure He is volume overloaded today in clinic BMET drawn this morning. Will adjust diuretics based on results this afternoon.  He may need to add back Metolazone once weekly.  Follow up in ICM clinic  3.  Paroxysmal atrial fibrillation Continue Warfarin for CHADS2VASC of 5    Current medicines are reviewed at length with the patient today.   The patient does not have concerns regarding his medicines.  The following changes were made today:  none  Labs/ tests ordered today include: none No orders of the defined types were placed in this encounter.    Disposition:   Follow up with Dr [name] 2 weeks virtually    Signed, Chanetta Marshall, NP 05/02/2019 11:33 AM  [place] [address] [place] [address] [phone] (office) [phone] (fax)

## 2019-05-02 ENCOUNTER — Other Ambulatory Visit: Payer: Medicare Other | Admitting: *Deleted

## 2019-05-02 ENCOUNTER — Ambulatory Visit (INDEPENDENT_AMBULATORY_CARE_PROVIDER_SITE_OTHER): Payer: Medicare Other | Admitting: Nurse Practitioner

## 2019-05-02 ENCOUNTER — Other Ambulatory Visit: Payer: Self-pay

## 2019-05-02 ENCOUNTER — Telehealth: Payer: Self-pay

## 2019-05-02 ENCOUNTER — Encounter: Payer: Self-pay | Admitting: Nurse Practitioner

## 2019-05-02 ENCOUNTER — Other Ambulatory Visit: Payer: Medicare Other

## 2019-05-02 VITALS — BP 118/56 | HR 63 | Ht 71.0 in | Wt 334.0 lb

## 2019-05-02 DIAGNOSIS — I480 Paroxysmal atrial fibrillation: Secondary | ICD-10-CM

## 2019-05-02 DIAGNOSIS — Z5181 Encounter for therapeutic drug level monitoring: Secondary | ICD-10-CM | POA: Diagnosis not present

## 2019-05-02 DIAGNOSIS — E875 Hyperkalemia: Secondary | ICD-10-CM

## 2019-05-02 DIAGNOSIS — I4891 Unspecified atrial fibrillation: Secondary | ICD-10-CM | POA: Diagnosis not present

## 2019-05-02 DIAGNOSIS — I5022 Chronic systolic (congestive) heart failure: Secondary | ICD-10-CM

## 2019-05-02 LAB — CUP PACEART INCLINIC DEVICE CHECK
Date Time Interrogation Session: 20210122144042
Implantable Lead Implant Date: 20190419
Implantable Lead Implant Date: 20190419
Implantable Lead Implant Date: 20190419
Implantable Lead Location: 753858
Implantable Lead Location: 753859
Implantable Lead Location: 753860
Implantable Lead Model: 5076
Implantable Lead Model: 5076
Implantable Pulse Generator Implant Date: 20190419
Pulse Gen Model: 3262
Pulse Gen Serial Number: 9008098

## 2019-05-02 LAB — BASIC METABOLIC PANEL
BUN/Creatinine Ratio: 37 — ABNORMAL HIGH (ref 10–24)
BUN: 69 mg/dL — ABNORMAL HIGH (ref 8–27)
CO2: 25 mmol/L (ref 20–29)
Calcium: 8.9 mg/dL (ref 8.6–10.2)
Chloride: 86 mmol/L — ABNORMAL LOW (ref 96–106)
Creatinine, Ser: 1.85 mg/dL — ABNORMAL HIGH (ref 0.76–1.27)
GFR calc Af Amer: 41 mL/min/{1.73_m2} — ABNORMAL LOW (ref >59)
GFR calc non Af Amer: 36 mL/min/{1.73_m2} — ABNORMAL LOW (ref >59)
Glucose: 139 mg/dL — ABNORMAL HIGH (ref 65–99)
Potassium: 3.1 mmol/L — ABNORMAL LOW (ref 3.5–5.2)
Sodium: 130 mmol/L — ABNORMAL LOW (ref 134–144)

## 2019-05-02 NOTE — Telephone Encounter (Signed)
I spoke to the patient with Frank [name]'s recommendation.  He will resume Potassium and I will call him Monday to f/u.  He verbalized understanding.

## 2019-05-02 NOTE — Patient Instructions (Addendum)
Medication Instructions:  NONE *If you need a refill on your cardiac medications before your next appointment, please call your pharmacy*  Lab Work: NONE If you have labs (blood work) drawn today and your tests are completely normal, you will receive your results only by: Frank [name] MyChart Message (if you have MyChart) OR . A paper copy in the mail If you have any lab test that is abnormal or we need to change your treatment, we will call you to review the results.  Testing/Procedures: NONE  Follow-Up: At [facility], you and your health needs are our priority.  As part of our continuing mission to provide you with exceptional heart care, we have created designated Provider Care Teams.  These Care Teams include your primary Cardiologist (physician) and Advanced Practice Providers (APPs -  Physician Assistants and Nurse Practitioners) who all work together to provide you with the care you need, when you need it.  Your next appointment:  2 WEEKS WITH Dr Frank [name]     Other Instructions Remote monitoring is used to monitor your Pacemaker  from home. This monitoring reduces the number of office visits required to check your device to one time per year. It allows [name] to keep an eye on the functioning of your device to ensure it is working properly. You are scheduled for a device check from home on 06/03/19. You may send your transmission at any time that day. If you have a wireless device, the transmission will be sent automatically. After your physician reviews your transmission, you will receive a postcard with your next transmission date.

## 2019-05-03 DIAGNOSIS — L03115 Cellulitis of right lower limb: Secondary | ICD-10-CM | POA: Diagnosis not present

## 2019-05-03 DIAGNOSIS — L139 Bullous disorder, unspecified: Secondary | ICD-10-CM | POA: Diagnosis not present

## 2019-05-03 DIAGNOSIS — B954 Other streptococcus as the cause of diseases classified elsewhere: Secondary | ICD-10-CM | POA: Diagnosis not present

## 2019-05-03 DIAGNOSIS — I890 Lymphedema, not elsewhere classified: Secondary | ICD-10-CM | POA: Diagnosis not present

## 2019-05-03 DIAGNOSIS — N390 Urinary tract infection, site not specified: Secondary | ICD-10-CM | POA: Diagnosis not present

## 2019-05-03 DIAGNOSIS — Z4800 Encounter for change or removal of nonsurgical wound dressing: Secondary | ICD-10-CM | POA: Diagnosis not present

## 2019-05-05 ENCOUNTER — Telehealth: Payer: Self-pay

## 2019-05-05 NOTE — Telephone Encounter (Signed)
I spoke to [place]) [phone] and ordered a BMET (home health draw) for the patient Wednesday or Thursday of this week.  They will reach out to patient and fax results to [name] when complete.

## 2019-05-06 ENCOUNTER — Telehealth: Payer: Self-pay | Admitting: Internal Medicine

## 2019-05-06 DIAGNOSIS — I890 Lymphedema, not elsewhere classified: Secondary | ICD-10-CM | POA: Diagnosis not present

## 2019-05-06 DIAGNOSIS — Z4800 Encounter for change or removal of nonsurgical wound dressing: Secondary | ICD-10-CM | POA: Diagnosis not present

## 2019-05-06 DIAGNOSIS — B954 Other streptococcus as the cause of diseases classified elsewhere: Secondary | ICD-10-CM | POA: Diagnosis not present

## 2019-05-06 DIAGNOSIS — L139 Bullous disorder, unspecified: Secondary | ICD-10-CM | POA: Diagnosis not present

## 2019-05-06 DIAGNOSIS — N390 Urinary tract infection, site not specified: Secondary | ICD-10-CM | POA: Diagnosis not present

## 2019-05-06 DIAGNOSIS — L03115 Cellulitis of right lower limb: Secondary | ICD-10-CM | POA: Diagnosis not present

## 2019-05-06 NOTE — Telephone Encounter (Signed)
Thanks

## 2019-05-06 NOTE — Telephone Encounter (Signed)
Pt c/o swelling: STAT is pt has developed SOB within 24 hours  1) How much weight have you gained and in what time span? 2lbs in a day  2) If swelling, where is the swelling located? legs and stomach  3) Are you currently taking a fluid pill? yes  4) Are you currently SOB? Increased shortness of breath  5) Do you have a log of your daily weights (if so, list)? yes *  6) Have you gained 3 pounds in a day or 5 pounds in a week?  2lbs in a day  7) Have you traveled recently? No     Pt is scheduled for lab work on Friday and pt wants to know if she have lab work before Friday? Please call with pt advice. Do not have to call Charyl back.

## 2019-05-06 NOTE — Telephone Encounter (Signed)
Patient states that his nurse concerned because he has gained 2 lbs overnight. The patient states that this is normal for him and that he usually fluctuates between 332 and 334 lbs. He states that he is not having any more swelling than he typically does but that he has had some increased SOB. He also states that he has not been eating or sleeping very well so he has felt a little bit weaker. Patient saw Chanetta Marshall, NP on 01/22 and has a follow-up virtual appointment with Dr. [name] on 02/04.   Patient is also asking about when he is supposed to get blood work done. I advised him that it looked like home health was supposed to get labs on Wednesday or Thursday but patient states that they told him they were not going to draw them until Friday.   I spoke to the patient's wife who stated that she was concerned about his leg. He had cellulitis in his right leg and he is done with his antibiotics. She reports that now it looks like there is increased redness in his left leg but not warmth. I advised her to reach out to Dr. Tommy Medal who he has been seeing for infectious disease about this.

## 2019-05-07 ENCOUNTER — Encounter: Payer: Self-pay | Admitting: Family Medicine

## 2019-05-07 DIAGNOSIS — N390 Urinary tract infection, site not specified: Secondary | ICD-10-CM | POA: Diagnosis not present

## 2019-05-07 DIAGNOSIS — B954 Other streptococcus as the cause of diseases classified elsewhere: Secondary | ICD-10-CM | POA: Diagnosis not present

## 2019-05-07 DIAGNOSIS — L139 Bullous disorder, unspecified: Secondary | ICD-10-CM | POA: Diagnosis not present

## 2019-05-07 DIAGNOSIS — L03115 Cellulitis of right lower limb: Secondary | ICD-10-CM | POA: Diagnosis not present

## 2019-05-07 DIAGNOSIS — Z4800 Encounter for change or removal of nonsurgical wound dressing: Secondary | ICD-10-CM | POA: Diagnosis not present

## 2019-05-07 DIAGNOSIS — I890 Lymphedema, not elsewhere classified: Secondary | ICD-10-CM | POA: Diagnosis not present

## 2019-05-09 ENCOUNTER — Encounter: Payer: Self-pay | Admitting: *Deleted

## 2019-05-09 ENCOUNTER — Other Ambulatory Visit: Payer: Self-pay | Admitting: *Deleted

## 2019-05-09 DIAGNOSIS — Z4800 Encounter for change or removal of nonsurgical wound dressing: Secondary | ICD-10-CM | POA: Diagnosis not present

## 2019-05-09 DIAGNOSIS — N390 Urinary tract infection, site not specified: Secondary | ICD-10-CM | POA: Diagnosis not present

## 2019-05-09 DIAGNOSIS — B954 Other streptococcus as the cause of diseases classified elsewhere: Secondary | ICD-10-CM | POA: Diagnosis not present

## 2019-05-09 DIAGNOSIS — I890 Lymphedema, not elsewhere classified: Secondary | ICD-10-CM | POA: Diagnosis not present

## 2019-05-09 DIAGNOSIS — L03115 Cellulitis of right lower limb: Secondary | ICD-10-CM | POA: Diagnosis not present

## 2019-05-09 DIAGNOSIS — L139 Bullous disorder, unspecified: Secondary | ICD-10-CM | POA: Diagnosis not present

## 2019-05-09 NOTE — Patient Outreach (Signed)
Frank [name]) Care Management Frank [name] Telephone Outreach Post-[name] discharge day # 39  05/09/2019  Frank [name] April 12, 1945 [id number]  Successful telephone outreach attempt to Frank [name], 74 y/o male referred to [facility] CM12/28/2020by [facility] CMA after EMMI Red Alert for General discharge. Patient had recent hospitalization December 6-21, 2020 for weakness/ AKI, pulmonary edema due to sepsis thought to be related to (R) lower extremity cellulitis. Patient was discharged home to self-care with home health services for PT and RN in place through [facility].Patient has history including, but not limited to, combined CHF with cardiomyopathy; previous MVR/ AVR with pacemaker; HTN; CKD- III; A-Fib on ACT; and OSA.  HIPAA/ identity verified with patient during phone call today, and he reports that he is "doing fine," and he states that he just woke up from a nap.  Patient denies pain/ new recent falls, and he sounds to be in no distress throughout phone call today.  Patient further reports:  -- has all medicationsand takesallas prescribed;denies questions/ concernsaroundcurrent medications; continues self- managing medications, uses weekly pill box; able to independently verbalize accurate understanding of use of diuretics.  Reports has initiated dose of antibiotics as instructed by ID provider, due to signs/ symptoms (L) leg cellulitis that started a few days ago; reports leg "already looks better" and verbalizes understanding to continue taking antibiotics for full course, as prescribed.  -- [facility]- has had visit from PT this morning and expects RN to arrive at any time this afternoon; reports services have been extended and endorses ongoing active participation I disciplines-- patient believes home health PT helping him with ambulation; states he feels stronger  -- All recent  and upcoming provider appointments were reviewed with patient today; patient verbalizes accurate understanding of all and will continue to be transported to appointments by spouse  Self-health management ofchronic disease state of CHF- cardiomyopathy/ OSA/ A-Fib: --patient has resumed daily weight monitoring/ recording at home and verbalizes a good understanding of the purpose of daily weight monitoring; reviewed recent weights at home and today he reports consistent weight ranges between "334-339 lbs," which is higher than previously reported weight ranges; confirmed that this was noted at time of recent cardiology appointment 05/02/2019, and that cardiology notes indicate that provider adjusted diuretics; patient verbalizes accurate report of diuretic dosing based on list in EMR; patient states that no changes were made at time of cardiology office visit. -- discussed and reiterated previously provided education around early vs. late signs/ symptoms yellow CHF zone; discussed goal weight for patient, which he reports today as "under 340 lbs" -- able to independently verbalize signs/ symptoms and corresponding action plan for yellow CHF zone: noted that patient has done a great job notifying his providers when he has questions/ concerns, and this was encouraged -- reports today that previously reported cough 04/23/2019 is better; occasionally using prescribed tessalon prn; denies ongoing issues with cough outside of his baseline. -- reviewed signs/ symptoms lower extremity cellulitis and confirmed that patient believes legs much better since recently starting antibiotics; confirms that home health RN monitors lower extremity edema/ overall status- appearance with each visit  Patientdeniesfurther issues, concerns, or problems today. Iconfirmed that patient hasmy direct phone number, the main [facility] CM office phone number, and the [name] CM 24-hour nurse advice phone number should issues arise prior to next  scheduled Frank [name] outreach within 2-3 weeks. Encouraged patient to contact me directly if needs,  questions, issues, or concerns arise prior to next scheduled outreach next month; patient agreed to do so.  Plan:  Patient will take medications as prescribed and will attend all scheduled provider appointments  Patient will promptly notify care providers for any new concerns/ issues/ problems that arise  Patient will actively participate in home health services as ordered post-[name] discharge  Patient will continue monitoring/ recording daily weightsand follow action plan for weight gain in setting of CHF  [name] outreach to continue with scheduled phone next month unless indicated sooner  Frank [name] CM Care Plan Problem One     Most Recent Value  Care Plan Problem One  High Risk for [name] readmission related to/ as evidenced by recent hospitalization December 6-21, 2020 for sepsis related to lower extremity cellulits  Role Documenting the Problem One  Care Management Coordinator  Care Plan for Problem One  Not Active  [facility] Long Term Goal   Over the next 31 days, patient will not experience [name] readmission, as evidenced by patient reporting and review of EMR during [name] RN CM outreach  Frank [name] [name] Long Term Goal Start Date  04/09/19  Frank [name] Long Term Goal Met Date  05/09/19 Frank [name] met]  Interventions for Problem One Long Term Goal  Confirmed that patient has not experienced [name] re-admission since his last [name] discharge,  discussed recent signs of ongoing cellulitis, along with patient's course of action- provided positive reinforcement for immediately folowing established action plan and notifying care providers  Frank [name] ([name]) CM Short Term Goal #1   Over the next 30 days, patient will attend all scheduled provider office visits as evidenced by patient reporting and review of EMR/ collaboration with PCP team as indicated during [facility] RN CM outreach  [name] CM Short Term Goal #1 Start  Date  04/09/19  [name] CM Short Term Goal #1 Met Date  05/09/19 [Goal met]  Interventions for Short Term Goal #1  Confirmed that patient has attended all recent provider appointments as scheduled and reviewed upcoming provider appointments,  confirmed that patient has ongoing reliable transportation through family members  [facility] CM Short Term Goal #2   Over the next 30 days, patient will actively participate with home health services as ordered post-[name] discharge, as evidenced by patient reporting and collaboration with home health team as indicated during [facility] RN CM outreach  [name] CM Short Term Goal #2 Start Date  04/09/19  [name] CM Short Term Goal #2 Met Date  05/09/19 [Goal Met]  Interventions for Short Term Goal #2  Confirmed that home health services have been extended and that patient continues actively participating in all disciplines as ordered,  reviewed this week's home health visits with patient,  confirmed that patient believes that he is ambulating better with home health PT interventions    [name] CM Care Plan Problem Two     Most Recent Value  Care Plan Problem Two  Need for ongoing reinforcement of self-health management of chronic disease state of CHF, as evidenced by patient reporting during [facility] RN CM outreach  Role Documenting the Problem Two  Care Management Coordinator  Care Plan for Problem Two  Active  Interventions for Problem Two Long Term Goal   Using teach back method, provided and reviewed education around early vs. late signs/ symptoms CHF exacerbation, along with corresponding action plan,  confirmed that previously reported cough is better,  reviewed cardiology office visit for 05/02/2019 with patient,  confirmed that patient continues taking medications (diuretics) as prescribed and is able  to independently verbalize accurate dosing of same  [facility] Long Term Goal  Over next 60 days, patient will promptly report signs/ symptoms yellow CHF zone to care providers, as evidenced by  patient reporting and collaboration with care providers as sindicated during [facility] RN CM outreach  [place] Term Goal Start Date  05/09/19  [facility] CM Short Term Goal #1   Over the next 30 days, patient will continue to monitor and record daily weights at home, as evidenced by patient reporting and review of same during [facility] RN CM outreach  [name] CM Short Term Goal #1 Start Date  05/09/19  Interventions for Short Term Goal #2   Reviewed recent weights at home and reiterated weight gain as earliest indicator of CHF exacerbation/ fluid retention,  discussed other signs/ symptoms yellow CHF zone,  discussed goal weight for patient and action plan for weight gain in setting of CHF     Oneta Rack, RN, BSN, [employer] Coordinator [name] Care Management  [phone]

## 2019-05-13 ENCOUNTER — Other Ambulatory Visit: Payer: Self-pay

## 2019-05-13 ENCOUNTER — Emergency Department (HOSPITAL_COMMUNITY): Payer: Medicare Other

## 2019-05-13 ENCOUNTER — Encounter (HOSPITAL_COMMUNITY): Payer: Self-pay | Admitting: Emergency Medicine

## 2019-05-13 ENCOUNTER — Inpatient Hospital Stay (HOSPITAL_COMMUNITY)
Admission: EM | Admit: 2019-05-13 | Discharge: 2019-05-26 | DRG: 291 | Disposition: A | Discharge status: Hospice | Payer: Medicare Other | Attending: Family Medicine | Admitting: Family Medicine

## 2019-05-13 ENCOUNTER — Telehealth: Payer: Self-pay | Admitting: Internal Medicine

## 2019-05-13 DIAGNOSIS — I071 Rheumatic tricuspid insufficiency: Secondary | ICD-10-CM | POA: Diagnosis not present

## 2019-05-13 DIAGNOSIS — Z66 Do not resuscitate: Secondary | ICD-10-CM

## 2019-05-13 DIAGNOSIS — K7290 Hepatic failure, unspecified without coma: Secondary | ICD-10-CM | POA: Diagnosis present

## 2019-05-13 DIAGNOSIS — R32 Unspecified urinary incontinence: Secondary | ICD-10-CM | POA: Diagnosis not present

## 2019-05-13 DIAGNOSIS — Z79899 Other long term (current) drug therapy: Secondary | ICD-10-CM

## 2019-05-13 DIAGNOSIS — E876 Hypokalemia: Secondary | ICD-10-CM | POA: Diagnosis not present

## 2019-05-13 DIAGNOSIS — D62 Acute posthemorrhagic anemia: Secondary | ICD-10-CM | POA: Diagnosis present

## 2019-05-13 DIAGNOSIS — I361 Nonrheumatic tricuspid (valve) insufficiency: Secondary | ICD-10-CM | POA: Diagnosis not present

## 2019-05-13 DIAGNOSIS — E875 Hyperkalemia: Secondary | ICD-10-CM | POA: Diagnosis present

## 2019-05-13 DIAGNOSIS — I4891 Unspecified atrial fibrillation: Secondary | ICD-10-CM | POA: Diagnosis not present

## 2019-05-13 DIAGNOSIS — K761 Chronic passive congestion of liver: Secondary | ICD-10-CM | POA: Diagnosis present

## 2019-05-13 DIAGNOSIS — I471 Supraventricular tachycardia: Secondary | ICD-10-CM | POA: Diagnosis not present

## 2019-05-13 DIAGNOSIS — R0602 Shortness of breath: Secondary | ICD-10-CM | POA: Diagnosis not present

## 2019-05-13 DIAGNOSIS — N179 Acute kidney failure, unspecified: Secondary | ICD-10-CM | POA: Diagnosis present

## 2019-05-13 DIAGNOSIS — R791 Abnormal coagulation profile: Secondary | ICD-10-CM | POA: Diagnosis not present

## 2019-05-13 DIAGNOSIS — T502X5A Adverse effect of carbonic-anhydrase inhibitors, benzothiadiazides and other diuretics, initial encounter: Secondary | ICD-10-CM | POA: Diagnosis not present

## 2019-05-13 DIAGNOSIS — N184 Chronic kidney disease, stage 4 (severe): Secondary | ICD-10-CM | POA: Diagnosis present

## 2019-05-13 DIAGNOSIS — I421 Obstructive hypertrophic cardiomyopathy: Secondary | ICD-10-CM | POA: Diagnosis not present

## 2019-05-13 DIAGNOSIS — Z8249 Family history of ischemic heart disease and other diseases of the circulatory system: Secondary | ICD-10-CM

## 2019-05-13 DIAGNOSIS — A419 Sepsis, unspecified organism: Secondary | ICD-10-CM | POA: Diagnosis not present

## 2019-05-13 DIAGNOSIS — I10 Essential (primary) hypertension: Secondary | ICD-10-CM | POA: Diagnosis present

## 2019-05-13 DIAGNOSIS — E871 Hypo-osmolality and hyponatremia: Secondary | ICD-10-CM | POA: Diagnosis present

## 2019-05-13 DIAGNOSIS — Z6841 Body Mass Index (BMI) 40.0 and over, adult: Secondary | ICD-10-CM | POA: Diagnosis not present

## 2019-05-13 DIAGNOSIS — G928 Other toxic encephalopathy: Secondary | ICD-10-CM

## 2019-05-13 DIAGNOSIS — T59891A Toxic effect of other specified gases, fumes and vapors, accidental (unintentional), initial encounter: Secondary | ICD-10-CM | POA: Diagnosis not present

## 2019-05-13 DIAGNOSIS — G92 Toxic encephalopathy: Secondary | ICD-10-CM | POA: Diagnosis not present

## 2019-05-13 DIAGNOSIS — Z515 Encounter for palliative care: Secondary | ICD-10-CM | POA: Diagnosis present

## 2019-05-13 DIAGNOSIS — Z86010 Personal history of colonic polyps: Secondary | ICD-10-CM

## 2019-05-13 DIAGNOSIS — I472 Ventricular tachycardia: Secondary | ICD-10-CM | POA: Diagnosis not present

## 2019-05-13 DIAGNOSIS — Z888 Allergy status to other drugs, medicaments and biological substances status: Secondary | ICD-10-CM

## 2019-05-13 DIAGNOSIS — D689 Coagulation defect, unspecified: Secondary | ICD-10-CM | POA: Diagnosis present

## 2019-05-13 DIAGNOSIS — E8770 Fluid overload, unspecified: Secondary | ICD-10-CM

## 2019-05-13 DIAGNOSIS — I5082 Biventricular heart failure: Secondary | ICD-10-CM | POA: Diagnosis present

## 2019-05-13 DIAGNOSIS — I50811 Acute right heart failure: Secondary | ICD-10-CM | POA: Diagnosis not present

## 2019-05-13 DIAGNOSIS — N170 Acute kidney failure with tubular necrosis: Secondary | ICD-10-CM | POA: Diagnosis not present

## 2019-05-13 DIAGNOSIS — R195 Other fecal abnormalities: Secondary | ICD-10-CM | POA: Diagnosis not present

## 2019-05-13 DIAGNOSIS — Z4800 Encounter for change or removal of nonsurgical wound dressing: Secondary | ICD-10-CM | POA: Diagnosis not present

## 2019-05-13 DIAGNOSIS — B954 Other streptococcus as the cause of diseases classified elsewhere: Secondary | ICD-10-CM | POA: Diagnosis not present

## 2019-05-13 DIAGNOSIS — I440 Atrioventricular block, first degree: Secondary | ICD-10-CM | POA: Diagnosis present

## 2019-05-13 DIAGNOSIS — Z681 Body mass index (BMI) 19 or less, adult: Secondary | ICD-10-CM | POA: Diagnosis not present

## 2019-05-13 DIAGNOSIS — Z7901 Long term (current) use of anticoagulants: Secondary | ICD-10-CM

## 2019-05-13 DIAGNOSIS — Z7982 Long term (current) use of aspirin: Secondary | ICD-10-CM

## 2019-05-13 DIAGNOSIS — Z7401 Bed confinement status: Secondary | ICD-10-CM | POA: Diagnosis not present

## 2019-05-13 DIAGNOSIS — E669 Obesity, unspecified: Secondary | ICD-10-CM | POA: Diagnosis not present

## 2019-05-13 DIAGNOSIS — I509 Heart failure, unspecified: Secondary | ICD-10-CM | POA: Diagnosis not present

## 2019-05-13 DIAGNOSIS — I130 Hypertensive heart and chronic kidney disease with heart failure and stage 1 through stage 4 chronic kidney disease, or unspecified chronic kidney disease: Secondary | ICD-10-CM | POA: Diagnosis present

## 2019-05-13 DIAGNOSIS — I5033 Acute on chronic diastolic (congestive) heart failure: Secondary | ICD-10-CM | POA: Diagnosis not present

## 2019-05-13 DIAGNOSIS — I110 Hypertensive heart disease with heart failure: Secondary | ICD-10-CM | POA: Diagnosis not present

## 2019-05-13 DIAGNOSIS — E611 Iron deficiency: Secondary | ICD-10-CM | POA: Diagnosis not present

## 2019-05-13 DIAGNOSIS — D631 Anemia in chronic kidney disease: Secondary | ICD-10-CM | POA: Diagnosis present

## 2019-05-13 DIAGNOSIS — I272 Pulmonary hypertension, unspecified: Secondary | ICD-10-CM | POA: Diagnosis not present

## 2019-05-13 DIAGNOSIS — L03116 Cellulitis of left lower limb: Secondary | ICD-10-CM | POA: Diagnosis not present

## 2019-05-13 DIAGNOSIS — Z954 Presence of other heart-valve replacement: Secondary | ICD-10-CM | POA: Diagnosis not present

## 2019-05-13 DIAGNOSIS — I422 Other hypertrophic cardiomyopathy: Secondary | ICD-10-CM | POA: Diagnosis present

## 2019-05-13 DIAGNOSIS — K7200 Acute and subacute hepatic failure without coma: Secondary | ICD-10-CM | POA: Diagnosis not present

## 2019-05-13 DIAGNOSIS — G9341 Metabolic encephalopathy: Secondary | ICD-10-CM | POA: Diagnosis present

## 2019-05-13 DIAGNOSIS — J9601 Acute respiratory failure with hypoxia: Secondary | ICD-10-CM | POA: Diagnosis present

## 2019-05-13 DIAGNOSIS — I5031 Acute diastolic (congestive) heart failure: Secondary | ICD-10-CM | POA: Diagnosis not present

## 2019-05-13 DIAGNOSIS — G4733 Obstructive sleep apnea (adult) (pediatric): Secondary | ICD-10-CM | POA: Diagnosis present

## 2019-05-13 DIAGNOSIS — D649 Anemia, unspecified: Secondary | ICD-10-CM | POA: Diagnosis not present

## 2019-05-13 DIAGNOSIS — N1832 Chronic kidney disease, stage 3b: Secondary | ICD-10-CM | POA: Diagnosis not present

## 2019-05-13 DIAGNOSIS — Z7902 Long term (current) use of antithrombotics/antiplatelets: Secondary | ICD-10-CM

## 2019-05-13 DIAGNOSIS — N171 Acute kidney failure with acute cortical necrosis: Secondary | ICD-10-CM | POA: Diagnosis not present

## 2019-05-13 DIAGNOSIS — R5381 Other malaise: Secondary | ICD-10-CM | POA: Diagnosis not present

## 2019-05-13 DIAGNOSIS — Z952 Presence of prosthetic heart valve: Secondary | ICD-10-CM | POA: Diagnosis not present

## 2019-05-13 DIAGNOSIS — Z8673 Personal history of transient ischemic attack (TIA), and cerebral infarction without residual deficits: Secondary | ICD-10-CM

## 2019-05-13 DIAGNOSIS — K5901 Slow transit constipation: Secondary | ICD-10-CM | POA: Diagnosis present

## 2019-05-13 DIAGNOSIS — Z87891 Personal history of nicotine dependence: Secondary | ICD-10-CM | POA: Diagnosis not present

## 2019-05-13 DIAGNOSIS — Z20822 Contact with and (suspected) exposure to covid-19: Secondary | ICD-10-CM | POA: Diagnosis present

## 2019-05-13 DIAGNOSIS — L139 Bullous disorder, unspecified: Secondary | ICD-10-CM | POA: Diagnosis not present

## 2019-05-13 DIAGNOSIS — L03115 Cellulitis of right lower limb: Secondary | ICD-10-CM | POA: Diagnosis not present

## 2019-05-13 DIAGNOSIS — I890 Lymphedema, not elsewhere classified: Secondary | ICD-10-CM | POA: Diagnosis not present

## 2019-05-13 DIAGNOSIS — N390 Urinary tract infection, site not specified: Secondary | ICD-10-CM | POA: Diagnosis not present

## 2019-05-13 DIAGNOSIS — L89102 Pressure ulcer of unspecified part of back, stage 2: Secondary | ICD-10-CM | POA: Diagnosis not present

## 2019-05-13 DIAGNOSIS — I50813 Acute on chronic right heart failure: Secondary | ICD-10-CM

## 2019-05-13 DIAGNOSIS — R159 Full incontinence of feces: Secondary | ICD-10-CM | POA: Diagnosis not present

## 2019-05-13 DIAGNOSIS — T45515A Adverse effect of anticoagulants, initial encounter: Secondary | ICD-10-CM | POA: Diagnosis present

## 2019-05-13 DIAGNOSIS — I428 Other cardiomyopathies: Secondary | ICD-10-CM | POA: Diagnosis not present

## 2019-05-13 DIAGNOSIS — Z85828 Personal history of other malignant neoplasm of skin: Secondary | ICD-10-CM

## 2019-05-13 DIAGNOSIS — Z8679 Personal history of other diseases of the circulatory system: Secondary | ICD-10-CM | POA: Diagnosis not present

## 2019-05-13 DIAGNOSIS — I350 Nonrheumatic aortic (valve) stenosis: Secondary | ICD-10-CM | POA: Diagnosis not present

## 2019-05-13 DIAGNOSIS — Z9889 Other specified postprocedural states: Secondary | ICD-10-CM | POA: Diagnosis not present

## 2019-05-13 DIAGNOSIS — E662 Morbid (severe) obesity with alveolar hypoventilation: Secondary | ICD-10-CM | POA: Diagnosis not present

## 2019-05-13 DIAGNOSIS — Z950 Presence of cardiac pacemaker: Secondary | ICD-10-CM | POA: Diagnosis not present

## 2019-05-13 DIAGNOSIS — I088 Other rheumatic multiple valve diseases: Secondary | ICD-10-CM | POA: Diagnosis present

## 2019-05-13 DIAGNOSIS — S80811D Abrasion, right lower leg, subsequent encounter: Secondary | ICD-10-CM | POA: Diagnosis not present

## 2019-05-13 DIAGNOSIS — Z741 Need for assistance with personal care: Secondary | ICD-10-CM | POA: Diagnosis not present

## 2019-05-13 DIAGNOSIS — I5084 End stage heart failure: Secondary | ICD-10-CM | POA: Diagnosis present

## 2019-05-13 DIAGNOSIS — I480 Paroxysmal atrial fibrillation: Secondary | ICD-10-CM | POA: Diagnosis present

## 2019-05-13 DIAGNOSIS — I5041 Acute combined systolic (congestive) and diastolic (congestive) heart failure: Secondary | ICD-10-CM | POA: Diagnosis not present

## 2019-05-13 DIAGNOSIS — M2550 Pain in unspecified joint: Secondary | ICD-10-CM | POA: Diagnosis not present

## 2019-05-13 LAB — BASIC METABOLIC PANEL
Anion gap: 14 (ref 5–15)
BUN: 87 mg/dL — ABNORMAL HIGH (ref 8–23)
CO2: 28 mmol/L (ref 22–32)
Calcium: 9 mg/dL (ref 8.9–10.3)
Chloride: 89 mmol/L — ABNORMAL LOW (ref 98–111)
Creatinine, Ser: 2.67 mg/dL — ABNORMAL HIGH (ref 0.61–1.24)
GFR calc Af Amer: 27 mL/min — ABNORMAL LOW (ref >60)
GFR calc non Af Amer: 23 mL/min — ABNORMAL LOW (ref >60)
Glucose, Bld: 105 mg/dL — ABNORMAL HIGH (ref 70–99)
Potassium: 7 mmol/L (ref 3.5–5.1)
Sodium: 131 mmol/L — ABNORMAL LOW (ref 135–145)

## 2019-05-13 LAB — PROTIME-INR
INR: 7.2 (ref 0.8–1.2)
Prothrombin Time: 61.8 seconds — ABNORMAL HIGH (ref 11.4–15.2)

## 2019-05-13 LAB — BRAIN NATRIURETIC PEPTIDE: B Natriuretic Peptide: 163.6 pg/mL — ABNORMAL HIGH (ref 0.0–100.0)

## 2019-05-13 LAB — CBC
HCT: 25.3 % — ABNORMAL LOW (ref 39.0–52.0)
Hemoglobin: 7.2 g/dL — ABNORMAL LOW (ref 13.0–17.0)
MCH: 28.5 pg (ref 26.0–34.0)
MCHC: 28.5 g/dL — ABNORMAL LOW (ref 30.0–36.0)
MCV: 100 fL (ref 80.0–100.0)
Platelets: 280 10*3/uL (ref 150–400)
RBC: 2.53 MIL/uL — ABNORMAL LOW (ref 4.22–5.81)
RDW: 18.8 % — ABNORMAL HIGH (ref 11.5–15.5)
WBC: 8.2 10*3/uL (ref 4.0–10.5)
nRBC: 0.2 % (ref 0.0–0.2)

## 2019-05-13 LAB — TROPONIN I (HIGH SENSITIVITY)
Troponin I (High Sensitivity): 53 ng/L — ABNORMAL HIGH (ref <18)
Troponin I (High Sensitivity): 58 ng/L — ABNORMAL HIGH (ref <18)

## 2019-05-13 LAB — RESPIRATORY PANEL BY RT PCR (FLU A&B, COVID)
Influenza A by PCR: NEGATIVE
Influenza B by PCR: NEGATIVE
SARS Coronavirus 2 by RT PCR: NEGATIVE

## 2019-05-13 MED ORDER — INSULIN ASPART 100 UNIT/ML ~~LOC~~ SOLN
10.0000 [IU] | Freq: Once | SUBCUTANEOUS | Status: AC
  Administered 2019-05-13: 22:00:00 10 [IU] via INTRAVENOUS

## 2019-05-13 MED ORDER — MORPHINE SULFATE (PF) 2 MG/ML IV SOLN
1.0000 mg | Freq: Once | INTRAVENOUS | Status: AC
  Administered 2019-05-14: 1 mg via INTRAVENOUS
  Filled 2019-05-13: qty 1

## 2019-05-13 MED ORDER — SODIUM ZIRCONIUM CYCLOSILICATE 5 G PO PACK
5.0000 g | PACK | Freq: Once | ORAL | Status: AC
  Administered 2019-05-14: 02:00:00 5 g via ORAL
  Filled 2019-05-13 (×3): qty 1

## 2019-05-13 MED ORDER — SODIUM BICARBONATE 8.4 % IV SOLN
50.0000 meq | Freq: Once | INTRAVENOUS | Status: AC
  Administered 2019-05-13: 50 meq via INTRAVENOUS
  Filled 2019-05-13: qty 50

## 2019-05-13 MED ORDER — SODIUM CHLORIDE 0.9% FLUSH
3.0000 mL | Freq: Once | INTRAVENOUS | Status: AC
  Administered 2019-05-14: 3 mL via INTRAVENOUS

## 2019-05-13 MED ORDER — CALCIUM GLUCONATE-NACL 1-0.675 GM/50ML-% IV SOLN
1.0000 g | Freq: Once | INTRAVENOUS | Status: AC
  Administered 2019-05-13: 22:00:00 1000 mg via INTRAVENOUS
  Filled 2019-05-13: qty 50

## 2019-05-13 MED ORDER — SODIUM CHLORIDE 0.9 % IV SOLN: 1.0000 g | Freq: Once | INTRAVENOUS | Status: DC

## 2019-05-13 MED ORDER — FUROSEMIDE 10 MG/ML IJ SOLN
60.0000 mg | Freq: Once | INTRAMUSCULAR | Status: AC
  Administered 2019-05-13: 22:00:00 60 mg via INTRAVENOUS
  Filled 2019-05-13: qty 6

## 2019-05-13 MED ORDER — DEXTROSE 50 % IV SOLN
50.0000 mL | Freq: Once | INTRAVENOUS | Status: AC
  Administered 2019-05-13: 22:00:00 50 mL via INTRAVENOUS
  Filled 2019-05-13: qty 50

## 2019-05-13 NOTE — ED Triage Notes (Signed)
C/o increased SOB at rest and 5 lb weight gain in 24 hours.  Pt denies pain.

## 2019-05-13 NOTE — Telephone Encounter (Signed)
Spoke with pt regarding his increasing weight gain and SOB.  Pt reports he was unable to weigh today but last weight was 343 which is up considerably from 320 on 04/18/2019.  Pt reports productive cough but has not looked at the color of his sputum.  Pt reports having to sleep in his recliner since hospital discharge.  He reports home health nurse comes twice weekly and has advised pt his lungs were clear on auscultation.  Pt is unsure of the date of last visit.  He states he is not taking anything for the cough as he feels it is r/t his CHF.  Denies CP, wheezing,  fever, nausea or vomiting.  Reports taking meds as prescribed.  Pt advised will send information to Dr [name] for review and advisement.  Pt verbalizes understanding and agrees with plan.

## 2019-05-13 NOTE — ED Provider Notes (Signed)
[facility] EMERGENCY DEPARTMENT Provider Note   CSN: [id number] Arrival date & time: 05/13/19  1730     History Chief Complaint  Patient presents with  . Shortness of Breath    Frank [name] is a 74 y.o. male.  HPI  11yM with dyspnea. Past hx of HTN, TIA, combined CHF, MVR, AVR, afib on coumadin, OSA, NCIM with St. Jude pacemaker, Pulm HTN, morbid obesity.    He has had progressive weight gain and dyspnea over the past several weeks.  He reports he has gained 5 pounds in the past day.  Orthopnea.  Increasing swelling in his legs and weeping fluid.  Occasional nonproductive cough.  No chest pain.  No fevers or chills.  Past Medical History:  Diagnosis Date  . Atrial arrhythmia    status post ablation in [place] with complication including damage to the aortic valve  . Atrial fibrillation ([place])   . AV block, 1st degree   . Bradycardia   . Cluster headache ~ 1980   "went away after I started taking one of the heart RXs" (06/29/2017)  . Diastolic CHF, chronic ([place])   . Diverticulosis   . History of gout   . HTN (hypertension)   . Hypertrophic cardiomyopathy ([place])    s/p myoectomy  . Infection   . Internal hemorrhoids   . Morbid obesity ([place])   . Morbid obesity with BMI of 45.0-49.9, adult ([place])   . Pacemaker  -SJM   . Skin cancer    "burned off RUE X 1; cut off nose X 2" (06/29/2017)  . TIA (transient ischemic attack) 2001  . Tubulovillous adenoma     Patient Active Problem List   Diagnosis Date Noted  . [place] 03/23/2019  . Hypotension 03/20/2019  . Group G streptococcal infection 03/19/2019  . Bacteremia 03/19/2019  . Sepsis ([place]) 03/17/2019  . UTI (urinary tract infection) 03/17/2019  . Acute respiratory failure with hypoxia ([place]) 03/17/2019  . Acute exacerbation of CHF (congestive heart failure) ([place]) 03/17/2019  . AKI (acute kidney injury) ([place]) 03/17/2019  . Change in bowel habits   . Rectal bleeding   . Pulmonary hypertension ([place])  12/17/2017  . 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 OSA (obstructive sleep apnea) 06/08/2017  . NICM (nonischemic cardiomyopathy) ([place]) 05/28/2017  . Chronic combined systolic and diastolic heart failure ([place]) 01/18/2017  . Dyspnea on exertion 07/12/2015  . Encounter for therapeutic drug monitoring 05/19/2013  . Cardiac pacemaker-St.Jude 06/22/2011  . S/P mitral valve replacement-Mechanical 10/10/2010  . Atrial fibrillation/tachycardia 06/08/2010  . TIA (transient ischemic attack) 06/08/2010  . S/P aortic valve repair 06/08/2010  . DIASTOLIC HEART FAILURE, CHRONIC 08/10/2009  . VENTRICULAR TACHYCARDIA 11/23/2008  . MORBID OBESITY 06/10/2007  . Essential hypertension 06/10/2007  . Hypertrophic obstructive cardiomyopathy ([place]) 06/10/2007  . DYSPNEA 06/10/2007  . CT, CHEST, ABNORMAL 06/10/2007  . MELANOMA, HX OF 06/10/2007    Past Surgical History:  Procedure Laterality Date  . 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 BIV PACEMAKER INSERTION CRT-P N/A 07/27/2017   Procedure: BIV PACEMAKER INSERTION CRT-P;  Surgeon: [name], MD;  Location: [facility];  Service: Cardiovascular;  Laterality: N/A;  . BIV UPGRADE N/A 05/28/2017   Procedure: BIV [place];  Surgeon: [name], MD;  Location: [facility];  Service: Cardiovascular;  Laterality: N/A;  . CARDIAC CATHETERIZATION     "couple times" (06/29/2017)  . CARDIAC VALVE REPLACEMENT    .  COLONOSCOPY    . COLONOSCOPY WITH PROPOFOL N/A 04/13/2016   Procedure: COLONOSCOPY WITH PROPOFOL;  Surgeon: Gatha Mayer, MD;  Location: [facility];  Service: Endoscopy;  Laterality: N/A;  . COLONOSCOPY WITH PROPOFOL N/A 02/28/2019   Procedure: COLONOSCOPY WITH PROPOFOL;  Surgeon: Gatha Mayer, MD;  Location: [facility];  Service: Endoscopy;  Laterality: N/A;  . INSERT / REPLACE / REMOVE PACEMAKER  ~ [address] [employer]   . MITRAL VALVE REPLACEMENT   07/22/1997   mechanical; St. Jude  . PACEMAKER LEAD REMOVAL N/A 06/29/2017   Procedure: [id number]  REMOVAL AND Four LEAD REMOVAL;  Surgeon: Evans Lance, MD;  Location: [facility];  Service: Cardiovascular;  Laterality: N/A;  . PACEMAKER REMOVAL  06/29/2017   GERNERATOR  REMOVAL AND Four LEAD REMOVAL; put in external pacemaker (06/29/2017)  . septal myectomy  07/22/1997  . SKIN CANCER EXCISION    . TEE WITHOUT CARDIOVERSION N/A 03/21/2019   Procedure: TRANSESOPHAGEAL ECHOCARDIOGRAM (TEE);  Surgeon: Jerline Pain, MD;  Location: [facility] ENDOSCOPY;  Service: Cardiovascular;  Laterality: N/A;     Family History  Problem Relation Age of Onset  . Heart disease Sister   . Uterine cancer Sister   . Obesity Sister        500+ lbs  . Heart disease Brother   . Heart disease Sister   . Heart disease Father   . Colon cancer Father 31  . Crohn's disease Paternal Grandmother    Social History   Tobacco Use  . Smoking status: Former Smoker    Years: 10.00    Types: Cigarettes, Pipe, Cigars    Quit date: 04/10/1977    Years since quitting: 42.1  . Smokeless tobacco: Never Used  Substance Use Topics  . Alcohol use: Yes    Alcohol/week: 2.0 standard drinks    Types: 1 Glasses of wine, 1 Cans of beer per week  . Drug use: No   Home Medications Prior to Admission medications   Medication Sig Start Date End Date Taking? Authorizing Provider  acetaminophen (TYLENOL) 325 MG tablet Take 2 tablets (650 mg total) by mouth every 8 (eight) hours as needed for mild pain or fever. 03/31/19   Hongalgi, Lenis Dickinson, MD  Amino Acids-Protein Hydrolys (FEEDING SUPPLEMENT, PRO-STAT SUGAR FREE 64,) LIQD Take 30 mLs by mouth 2 (two) times daily. 03/31/19   Hongalgi, Lenis Dickinson, MD  Ascorbic Acid (VITAMIN C) 1000 MG tablet Take 2,000 mg by mouth daily.      [provider]  aspirin 81 MG tablet Take 81 mg by mouth daily.    [provider]  benzonatate (TESSALON) 100 MG capsule Take 1 capsule (100 mg total)  by mouth 2 (two) times daily as needed for cough. 04/22/19   [name], MD  diphenhydramine-acetaminophen (TYLENOL PM) 25-500 MG TABS tablet Take 2 tablets by mouth at bedtime as needed (for sleep).    [provider]  Ensure Max Protein (ENSURE MAX PROTEIN) LIQD Take 330 mLs (11 oz total) by mouth daily. 04/01/19   Hongalgi, Lenis Dickinson, MD  ferrous sulfate 325 (65 FE) MG tablet Take 325 mg by mouth daily with breakfast.    [provider]  fish oil-omega-3 fatty acids 1000 MG capsule Take 4 g by mouth every morning.     [provider]  HYDROcodone-acetaminophen (NORCO/VICODIN) 5-325 MG tablet Take 1 tablet by mouth every 8 (eight) hours as needed for moderate pain or severe pain. 04/09/19  [name], MD  JANTOVEN 10 MG tablet Take 0.5 tablets (5 mg total) by mouth daily at 6 PM. Take above dose until follow up with your Coumadin clinic then follow their instructions for dosing. 03/31/19   Hongalgi, Lenis Dickinson, MD  Magnesium 250 MG TABS Take 500-750 mg by mouth See admin instructions. 750 mg in the morning, 500 mg in the evening    [provider]  Multiple Vitamin (MULTIVITAMIN) tablet Take 1 tablet by mouth daily.      [provider]  omeprazole (PRILOSEC) 20 MG capsule Take 1 capsule (20 mg total) by mouth daily. 04/08/19   [name], MD  potassium chloride (KLOR-CON) 10 MEQ tablet Take 2 tablets (20 mEq total) by mouth 2 (two) times daily. 04/21/19   Baldwin Jamaica, PA-C  torsemide (DEMADEX) 20 MG tablet Take 3 tablets (60 mg total) by mouth 2 (two) times daily. Take an extra tablet as needed 04/21/19   [name], MD  warfarin (COUMADIN) 10 MG tablet Take 10 mg by mouth daily. Takes 10 mg po QD; extra 5 mg on Sunday and Wednesday    [name], MD  Wheat Dextrin (BENEFIBER) CHEW Chew 3 tablets by mouth daily.    [provider]  enoxaparin (LOVENOX) 150 MG/ML injection Inject 1 mL (150 mg total) into the skin every  12 (twelve) hours. 02/20/19 03/16/19  [name], MD  fondaparinux (ARIXTRA) 10 MG/0.8ML SOLN injection Inject 0.8 mLs (10 mg total) into the skin daily. 02/20/19 03/16/19  [name], MD    Allergies    Warfarin and related  Review of Systems   Review of Systems All systems reviewed and negative, other than as noted in HPI.  Physical Exam Updated Vital Signs BP 123/65   Pulse 62   Temp 98.5 F (36.9 C) (Oral)   Resp (!) 24   SpO2 96%   Physical Exam Vitals and nursing note reviewed.  Constitutional:      Appearance: He is well-developed. He is obese.  HENT:     Head: Normocephalic and atraumatic.  Eyes:     General:        Right eye: No discharge.        Left eye: No discharge.     Conjunctiva/sclera: Conjunctivae normal.  Cardiovascular:     Rate and Rhythm: Normal rate and regular rhythm.     Heart sounds: Normal heart sounds. No murmur. No friction rub. No gallop.   Pulmonary:     Effort: Pulmonary effort is normal. No respiratory distress.     Breath sounds: Normal breath sounds.  Abdominal:     General: There is no distension.     Palpations: Abdomen is soft.     Tenderness: There is no abdominal tenderness.  Musculoskeletal:        General: No tenderness.     Cervical back: Neck supple.     Right lower leg: Edema present.     Left lower leg: Edema present.     Comments: Different symmetric lower extremity edema.  Weeping from some areas.  No cellulitis.  Skin:    General: Skin is warm and dry.  Neurological:     Mental Status: He is alert.  Psychiatric:        Behavior: Behavior normal.        Thought Content: Thought content normal.     ED Results / Procedures / Treatments   Labs (all labs ordered are  listed, but only abnormal results are displayed) Labs Reviewed  BASIC METABOLIC PANEL - Abnormal; Notable for the following components:      Result Value   Sodium 131 (*)    Potassium 7.0 (*)    Chloride 89 (*)    Glucose, Bld 105 (*)     BUN 87 (*)    Creatinine, Ser 2.67 (*)    GFR calc non Af Amer 23 (*)    GFR calc Af Amer 27 (*)    All other components within normal limits  CBC - Abnormal; Notable for the following components:   RBC 2.53 (*)    Hemoglobin 7.2 (*)    HCT 25.3 (*)    MCHC 28.5 (*)    RDW 18.8 (*)    All other components within normal limits  BRAIN NATRIURETIC PEPTIDE - Abnormal; Notable for the following components:   B Natriuretic Peptide 163.6 (*)    All other components within normal limits  PROTIME-INR - Abnormal; Notable for the following components:   Prothrombin Time 61.8 (*)    INR 7.2 (*)    All other components within normal limits  PROTIME-INR - Abnormal; Notable for the following components:   Prothrombin Time 64.5 (*)    INR 7.6 (*)    All other components within normal limits  COMPREHENSIVE METABOLIC PANEL - Abnormal; Notable for the following components:   Sodium 132 (*)    Potassium 6.4 (*)    Chloride 91 (*)    BUN 89 (*)    Creatinine, Ser 2.77 (*)    Calcium 8.8 (*)    Albumin 2.5 (*)    Alkaline Phosphatase 140 (*)    Total Bilirubin 1.4 (*)    GFR calc non Af Amer 22 (*)    GFR calc Af Amer 25 (*)    All other components within normal limits  CBC WITH DIFFERENTIAL/PLATELET - Abnormal; Notable for the following components:   RBC 2.26 (*)    Hemoglobin 6.5 (*)    HCT 21.4 (*)    RDW 18.6 (*)    Monocytes Absolute 1.6 (*)    All other components within normal limits  TSH - Abnormal; Notable for the following components:   TSH 4.695 (*)    All other components within normal limits  PROTIME-INR - Abnormal; Notable for the following components:   Prothrombin Time 57.7 (*)    INR 6.6 (*)    All other components within normal limits  CBC - Abnormal; Notable for the following components:   RBC 2.29 (*)    Hemoglobin 6.4 (*)    HCT 21.9 (*)    MCHC 29.2 (*)    RDW 19.0 (*)    nRBC 0.4 (*)    All other components within normal limits  RENAL FUNCTION PANEL -  Abnormal; Notable for the following components:   Sodium 129 (*)    Potassium 6.0 (*)    Chloride 90 (*)    Glucose, Bld 100 (*)    BUN 93 (*)    Creatinine, Ser 2.76 (*)    Calcium 8.6 (*)    Albumin 2.4 (*)    GFR calc non Af Amer 22 (*)    GFR calc Af Amer 26 (*)    All other components within normal limits  VITAMIN B12 - Abnormal; Notable for the following components:   Vitamin B-12 2,244 (*)    All other components within normal limits  IRON AND TIBC - Abnormal; Notable for  the following components:   Saturation Ratios 15 (*)    All other components within normal limits  RETICULOCYTES - Abnormal; Notable for the following components:   Retic Ct Pct 4.3 (*)    RBC. 2.20 (*)    Immature Retic Fract 37.1 (*)    All other components within normal limits  PROTIME-INR - Abnormal; Notable for the following components:   Prothrombin Time 56.8 (*)    INR 6.4 (*)    All other components within normal limits  RENAL FUNCTION PANEL - Abnormal; Notable for the following components:   Sodium 131 (*)    Potassium 5.5 (*)    Chloride 91 (*)    Glucose, Bld 125 (*)    BUN 98 (*)    Creatinine, Ser 2.91 (*)    Calcium 8.7 (*)    Phosphorus 5.7 (*)    Albumin 2.5 (*)    GFR calc non Af Amer 21 (*)    GFR calc Af Amer 24 (*)    All other components within normal limits  MAGNESIUM - Abnormal; Notable for the following components:   Magnesium 3.1 (*)    All other components within normal limits  CBC - Abnormal; Notable for the following components:   RBC 2.50 (*)    Hemoglobin 7.2 (*)    HCT 23.6 (*)    RDW 18.4 (*)    All other components within normal limits  OCCULT BLOOD X 1 CARD TO LAB, STOOL - Abnormal; Notable for the following components:   Fecal Occult Bld POSITIVE (*)    All other components within normal limits  RENAL FUNCTION PANEL - Abnormal; Notable for the following components:   Sodium 130 (*)    Potassium 6.0 (*)    Chloride 89 (*)    Glucose, Bld 121 (*)    BUN  98 (*)    Creatinine, Ser 2.89 (*)    Calcium 8.6 (*)    Phosphorus 5.3 (*)    Albumin 2.5 (*)    GFR calc non Af Amer 21 (*)    GFR calc Af Amer 24 (*)    All other components within normal limits  HEMOGLOBIN AND HEMATOCRIT, BLOOD - Abnormal; Notable for the following components:   Hemoglobin 7.2 (*)    HCT 23.7 (*)    All other components within normal limits  HEMOGLOBIN AND HEMATOCRIT, BLOOD - Abnormal; Notable for the following components:   Hemoglobin 7.4 (*)    HCT 24.2 (*)    All other components within normal limits  PROTIME-INR - Abnormal; Notable for the following components:   Prothrombin Time 58.1 (*)    INR 6.6 (*)    All other components within normal limits  LACTATE DEHYDROGENASE - Abnormal; Notable for the following components:   LDH 300 (*)    All other components within normal limits  HEMOGLOBIN AND HEMATOCRIT, BLOOD - Abnormal; Notable for the following components:   Hemoglobin 7.1 (*)    HCT 23.1 (*)    All other components within normal limits  PROTIME-INR - Abnormal; Notable for the following components:   Prothrombin Time 44.7 (*)    INR 4.8 (*)    All other components within normal limits  RENAL FUNCTION PANEL - Abnormal; Notable for the following components:   Sodium 132 (*)    Chloride 87 (*)    Glucose, Bld 113 (*)    BUN 100 (*)    Creatinine, Ser 2.84 (*)    Calcium 8.8 (*)  Phosphorus 5.3 (*)    Albumin 2.4 (*)    GFR calc non Af Amer 21 (*)    GFR calc Af Amer 25 (*)    Anion gap 16 (*)    All other components within normal limits  MAGNESIUM - Abnormal; Notable for the following components:   Magnesium 3.0 (*)    All other components within normal limits  HEMOGLOBIN AND HEMATOCRIT, BLOOD - Abnormal; Notable for the following components:   Hemoglobin 6.9 (*)    HCT 22.8 (*)    All other components within normal limits  HAPTOGLOBIN - Abnormal; Notable for the following components:   Haptoglobin <10 (*)    All other components within  normal limits  RENAL FUNCTION PANEL - Abnormal; Notable for the following components:   Sodium 133 (*)    Potassium 3.2 (*)    Chloride 91 (*)    Glucose, Bld 109 (*)    BUN 91 (*)    Creatinine, Ser 2.37 (*)    Calcium 8.3 (*)    Phosphorus 5.1 (*)    Albumin 2.3 (*)    GFR calc non Af Amer 27 (*)    GFR calc Af Amer 31 (*)    All other components within normal limits  COOXEMETRY PANEL - Abnormal; Notable for the following components:   Total hemoglobin 6.9 (*)    Carboxyhemoglobin 2.7 (*)    All other components within normal limits  CBC - Abnormal; Notable for the following components:   RBC 2.25 (*)    Hemoglobin 6.5 (*)    HCT 21.9 (*)    MCHC 29.7 (*)    RDW 18.0 (*)    nRBC 0.4 (*)    All other components within normal limits  MAGNESIUM - Abnormal; Notable for the following components:   Magnesium 2.9 (*)    All other components within normal limits  PROTIME-INR - Abnormal; Notable for the following components:   Prothrombin Time 30.0 (*)    INR 2.9 (*)    All other components within normal limits  HEMOGLOBIN AND HEMATOCRIT, BLOOD - Abnormal; Notable for the following components:   Hemoglobin 7.0 (*)    HCT 23.0 (*)    All other components within normal limits  LACTATE DEHYDROGENASE - Abnormal; Notable for the following components:   LDH 238 (*)    All other components within normal limits  TROPONIN I (HIGH SENSITIVITY) - Abnormal; Notable for the following components:   Troponin I (High Sensitivity) 53 (*)    All other components within normal limits  TROPONIN I (HIGH SENSITIVITY) - Abnormal; Notable for the following components:   Troponin I (High Sensitivity) 58 (*)    All other components within normal limits  RESPIRATORY PANEL BY RT PCR (FLU A&B, COVID)  CULTURE, BLOOD (ROUTINE X 2)  CULTURE, BLOOD (ROUTINE X 2)  GLUCOSE, CAPILLARY  FERRITIN  FOLATE  PROTIME-INR  RENAL FUNCTION PANEL  MAGNESIUM  CBC  COOXEMETRY PANEL  TYPE AND SCREEN  PREPARE RBC  (CROSSMATCH)  PREPARE RBC (CROSSMATCH)  PREPARE RBC (CROSSMATCH)  TYPE AND SCREEN    EKG EKG Interpretation  Date/Time:  Tuesday May 13 2019 17:46:51 EST Ventricular Rate:  63 PR Interval:  182 QRS Duration: 158 QT Interval:  530 QTC Calculation: 542 R Axis:   -147 Text Interpretation: Poor data quality Atrial fibrillation Confirmed by Virgel Manifold [phone]) on 05/13/2019 7:59:34 PM   Radiology DG Chest 2 View  Result Date: 05/13/2019 CLINICAL DATA:  Shortness of  breath EXAM: CHEST - 2 VIEW COMPARISON:  March 16, 2019 FINDINGS: There is a dual lead AICD. Multiple sternal wires are present. Mild, chronic appearing increased interstitial lung markings are seen. Very mild atelectasis is noted within the bilateral lung bases. There is no evidence of a pleural effusion or pneumothorax. The cardiac silhouette is markedly enlarged. Multilevel degenerative changes seen throughout the thoracic spine. IMPRESSION: 1. Marked cardiomegaly. 2. Mild, chronic appearing increased interstitial lung markings. A mild superimposed component of interstitial edema cannot be excluded. 3. Mild bibasilar atelectasis. Electronically Signed   By: Virgina Norfolk M.D.   On: 05/13/2019 18:52    Procedures Procedures (including critical care time)  Medications Ordered in ED Medications  sodium chloride flush (NS) 0.9 % injection 3 mL (3 mLs Intravenous Not Given 05/13/19 1924)  sodium bicarbonate injection 50 mEq (has no administration in time range)  insulin aspart (novoLOG) injection 10 Units (has no administration in time range)  dextrose 50 % solution 50 mL (has no administration in time range)  furosemide (LASIX) injection 60 mg (has no administration in time range)  sodium zirconium cyclosilicate (LOKELMA) packet 5 g (has no administration in time range)  calcium gluconate 1 g/ 50 mL sodium chloride IVPB (has no administration in time range)    ED Course  I have reviewed the triage vital signs and  the nursing notes.  Pertinent labs & imaging results that were available during my care of the patient were reviewed by me and considered in my medical decision making (see chart for details).    MDM Rules/Calculators/A&P                      3yM with acute on chronic HF. Significant recent weight gain. Diuresis. Troponin mildly elevated but denies pain. Carrdiology consulted. Medicine to admit.   Final Clinical Impression(s) / ED Diagnoses Final diagnoses:  Acute on chronic congestive heart failure, unspecified heart failure type ([place])  Hypervolemia, unspecified hypervolemia type    Rx / DC Orders ED Discharge Orders    None       Virgel Manifold, MD 05/18/19 1247

## 2019-05-13 NOTE — Consult Note (Signed)
Cardiology Consult    Patient ID: Frank [name] MRN: [id number], DOB/AGE: [birth year]   Admit date: 05/13/2019 Date of Consult: 05/13/2019  Primary Physician: [name], MD Primary Cardiologist: [name], MD Requesting Provider: Gean Birchwood, MD  Patient Profile    Frank [name] is a 74 y.o. male with a history of HCM s/p myomectomy, mechanical MVR, and aortic valve repair (damaged during prior ablation for atrial arrhythmias), diuretic-resistant HFpEF, profound first degree AVB s/p PPM with upgrade to SJM CRT-P following a decline in his LVEF (PM-induced, now recovered), HTN, TIA, paroxysmal AF, OSA/OHS, morbid obesity, and a recent group G streptococcal bacteremia due to cellulitis, but no evidence of valvular/lead vegetation. He is being seen today for the evaluation of dyspnea and weight gain.   History of Present Illness    Frank [name] reports worsening dyspnea for the last week, more acutely worsened over the past 24-48 hours, as well as worsening orthopnea. Per home health nursing, his weight was up to 343 lb yesterday from 320 lb on 1/8; he reports that his weight typically ranges from 334-339 lb, but has gradually been creeping up over the past week with a 5 lb weight gain over 24 hours. He was last hospitalized in December for sepsis due to group G strep bacteremia related to SSTI, and treated for decompensated heart failure at that time as well. He was charted as 11L negative, though per review of that hospitalization, his weight never changed and was discharged back on his current regimen of Demadex 60mg  BID with intermittent metolazone. He reports chronic lower extremity swelling for 30 years, but does seem a little worse. He denies any dietary indiscretion or medication non-compliance. He denies any chest pain, syncope, or palpitations.   ED evaluation notable for AKI with Cr 2.67, BUN 87, and K 7.0 (Cr most recently has ranged from 1.6-2). Hgb 7.2 (has been 8-9  since his recent infection), INR 7.2. BNP 163, highest thus far. Troponin 53. He was treated for the hyperkalemia and given Lasix 60mg  IV.   Past Medical History   Past Medical History:  Diagnosis Date  . Atrial arrhythmia    status post ablation in [place] with complication including damage to the aortic valve  . Atrial fibrillation ([place])   . AV block, 1st degree   . Bradycardia   . Cluster headache ~ 1980   "went away after I started taking one of the heart RXs" (06/29/2017)  . Diastolic CHF, chronic ([place])   . Diverticulosis   . History of gout   . HTN (hypertension)   . Hypertrophic cardiomyopathy ([place])    s/p myoectomy  . Infection   . Internal hemorrhoids   . Morbid obesity ([place])   . Morbid obesity with BMI of 45.0-49.9, adult ([place])   . Pacemaker  -SJM   . Skin cancer    "burned off RUE X 1; cut off nose X 2" (06/29/2017)  . TIA (transient ischemic attack) 2001  . Tubulovillous adenoma     Past Surgical History:  Procedure Laterality Date  . 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 BIV PACEMAKER INSERTION CRT-P N/A 07/27/2017   Procedure: BIV PACEMAKER INSERTION CRT-P;  Surgeon: [name], MD;  Location: [facility] [facility];  Service: Cardiovascular;  Laterality: N/A;  . BIV UPGRADE N/A 05/28/2017   Procedure: BIV [place];  Surgeon: [name], MD;  Location: [place] [facility];  Service:  Cardiovascular;  Laterality: N/A;  . CARDIAC CATHETERIZATION     "couple times" (06/29/2017)  . CARDIAC VALVE REPLACEMENT    . COLONOSCOPY    . COLONOSCOPY WITH PROPOFOL N/A 04/13/2016   Procedure: COLONOSCOPY WITH PROPOFOL;  Surgeon: Gatha Mayer, MD;  Location: [facility];  Service: Endoscopy;  Laterality: N/A;  . COLONOSCOPY WITH PROPOFOL N/A 02/28/2019   Procedure: COLONOSCOPY WITH PROPOFOL;  Surgeon: Gatha Mayer, MD;  Location: [facility];  Service: Endoscopy;  Laterality: N/A;  . INSERT / REPLACE /  REMOVE PACEMAKER  ~ [address]   . 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  . PACEMAKER LEAD REMOVAL N/A 06/29/2017   Procedure: [id number]  REMOVAL AND Four LEAD REMOVAL;  Surgeon: Evans Lance, MD;  Location: [facility];  Service: Cardiovascular;  Laterality: N/A;  . PACEMAKER REMOVAL  06/29/2017   GERNERATOR  REMOVAL AND Four LEAD REMOVAL; put in external pacemaker (06/29/2017)  . septal myectomy  07/22/1997  . SKIN CANCER EXCISION    . TEE WITHOUT CARDIOVERSION N/A 03/21/2019   Procedure: TRANSESOPHAGEAL ECHOCARDIOGRAM (TEE);  Surgeon: Jerline Pain, MD;  Location: [facility];  Service: Cardiovascular;  Laterality: N/A;     Allergies  Allergen Reactions  . Warfarin And Related Other (See Comments)    COUMADIN or JANTOVEN BRAND ONLY** Per pt, was switched to generic in 2001 and had significantly decreased absorption > INR subtherapeutic> pt suffered a TIA and has been on brand name only since    Inpatient Medications    . sodium chloride flush  3 mL Intravenous Once  . sodium zirconium cyclosilicate  5 g Oral Once    Family History    Family History  Problem Relation Age of Onset  . Heart disease Sister   . Uterine cancer Sister   . Obesity Sister        500+ lbs  . Heart disease Brother   . Heart disease Sister   . Heart disease Father   . Colon cancer Father 11  . Crohn's disease Paternal Grandmother    He indicated that his mother is deceased. He indicated that his father is deceased. He indicated that only one of his two sisters is alive. He indicated that his brother is alive. He indicated that his maternal grandmother is deceased. He indicated that his maternal grandfather is deceased. He indicated that his paternal grandmother is deceased. He indicated that his paternal grandfather is deceased.   Social History    Social History   Socioeconomic History  . Marital status: Married    Spouse name: Not on file  . Number of children:  1  . Years of education: Not on file  . Highest education level: Not on file  Occupational History  . Occupation: Corporate investment banker, retired    Fish farm manager: [employer]  Tobacco Use  . Smoking status: Former Smoker    Years: 10.00    Types: Cigarettes, Pipe, Cigars    Quit date: 04/10/1977    Years since quitting: 42.1  . Smokeless tobacco: Never Used  Substance and Sexual Activity  . Alcohol use: Yes    Alcohol/week: 2.0 standard drinks    Types: 1 Glasses of wine, 1 Cans of beer per week  . Drug use: No  . Sexual activity: Not Currently  Other Topics Concern  . Not on file  Social History Narrative   Married, one adopted son and one daughter. He has been  a Corporate investment banker in the [employer] focusing on rubber products.   2 caffeinated beverages daily   Social Determinants of Health   Financial Resource Strain:   . Difficulty of Paying Living Expenses: Not on file  Food Insecurity: No Food Insecurity  . Worried About Charity fundraiser in the Last Year: Never true  . Ran Out of Food in the Last Year: Never true  Transportation Needs: No Transportation Needs  . Lack of Transportation (Medical): No  . Lack of Transportation (Non-Medical): No  Physical Activity:   . Days of Exercise per Week: Not on file  . Minutes of Exercise per Session: Not on file  Stress:   . Feeling of Stress : Not on file  Social Connections:   . Frequency of Communication with Friends and Family: Not on file  . Frequency of Social Gatherings with Friends and Family: Not on file  . Attends Religious Services: Not on file  . Active Member of Clubs or Organizations: Not on file  . Attends Archivist Meetings: Not on file  . Marital Status: Not on file  Intimate Partner Violence:   . Fear of Current or Ex-Partner: Not on file  . Emotionally Abused: Not on file  . Physically Abused: Not on file  . Sexually Abused: Not on file     Review of Systems    General:  No chills,  fever, night sweats or weight changes.  Cardiovascular:  No chest pain, dyspnea on exertion, edema, orthopnea, palpitations, paroxysmal nocturnal dyspnea. Dermatological: No rash, lesions/masses Respiratory: No cough, dyspnea Urologic: No hematuria, dysuria Abdominal:   No nausea, vomiting, diarrhea, bright red blood per rectum, melena, or hematemesis Neurologic:  No visual changes, wkns, changes in mental status. All other systems reviewed and are otherwise negative except as noted above.  Physical Exam    Blood pressure (!) 128/44, pulse (!) 59, temperature 98.5 F (36.9 C), temperature source Oral, resp. rate (!) 24, SpO2 100 %.    No intake or output data in the 24 hours ending 05/13/19 2217 Wt Readings from Last 3 Encounters:  05/02/19 (!) 151.5 kg  04/14/19 (!) 148.8 kg  03/31/19 (!) 153 kg    CONSTITUTIONAL: alert and conversant, well-appearing, nourished, no distress HEENT: oropharynx clear and moist, no mucosal lesions, slight conjunctival pallor, EOM intact, pupils equal. NECK: symmetric, gross JVD with JVP elevated above the ear at 60 degrees  CARDIOVASCULAR: Regular rhythm. Harsh 2/6 early-peaking systolic murmur. Normal S1/S2, crisp mechanical valve click. No S3/S4. Radial pulses 2+. No carotid bruits. PULMONARY/CHEST WALL: no deformities, basilar crackles, normal work of breathing at rest ABDOMINAL: soft, non-tender, non-distended EXTREMITIES: tense edema to thighs, warm and well-perfused SKIN: warm and dry, erythematous distal lower extremities, RLE wrapped (treated for cellulitis) NEUROLOGIC: alert, normal gait, no abnormal movements, cranial nerves grossly intact.   Labs    Cardiac Panel (last 3 results) Recent Labs    05/13/19 1930 05/13/19 2133  TROPONINIHS 53* 58*   Lab Results  Component Value Date   WBC 8.2 05/13/2019   HGB 7.2 (L) 05/13/2019   HCT 25.3 (L) 05/13/2019   MCV 100.0 05/13/2019   PLT 280 05/13/2019    Recent Labs  Lab 05/13/19 1759   NA 131*  K 7.0*  CL 89*  CO2 28  BUN 87*  CREATININE 2.67*  CALCIUM 9.0  GLUCOSE 105*   Lab Results  Component Value Date   CHOL 176 07/28/2015   HDL 34 (L) 07/28/2015  Walls 113 07/28/2015   TRIG 143 07/28/2015   No results found for: [facility]   Radiology Studies    DG Chest 2 View  Result Date: 05/13/2019 CLINICAL DATA:  Shortness of breath EXAM: CHEST - 2 VIEW COMPARISON:  March 16, 2019 FINDINGS: There is a dual lead AICD. Multiple sternal wires are present. Mild, chronic appearing increased interstitial lung markings are seen. Very mild atelectasis is noted within the bilateral lung bases. There is no evidence of a pleural effusion or pneumothorax. The cardiac silhouette is markedly enlarged. Multilevel degenerative changes seen throughout the thoracic spine. IMPRESSION: 1. Marked cardiomegaly. 2. Mild, chronic appearing increased interstitial lung markings. A mild superimposed component of interstitial edema cannot be excluded. 3. Mild bibasilar atelectasis. Electronically Signed   By: Virgina Norfolk M.D.   On: 05/13/2019 18:52   CUP PACEART INCLINIC DEVICE CHECK  Result Date: 05/02/2019 Device check in clinic. see scanned report.   ECG & Cardiac Imaging    TTE 03/19/2019: EF 55-60%, mild LVH, RV volume overload. Severely reduced RV systolic function, RV severely enlarged. No RVH. Pacing leads visualized in the RA and RV. Left atrial size was mild-moderately dilated. Right atrial size was severely dilated.  Aortic valve regurgitation is mild. The aortic valve is severely calcified with restricted leaflet motion.  Doppler interrogation is poor on this study. Vmax 2.5 m/s, Mean gradient ~8.5 mmHG, but suspect this is underestimated. There is likely a mild to moderate degree of aortic stenosis present.  Mechanical mitral valve present. Mean gradient 5 mmHG with heart rate 60 bpm. EOA 5 mmHG. Normal functioning prosthetic valve with stable gradients compared with prior.    Tricuspid valve regurgitation is severe.  With an assumed right atrial pressure of 15 mmHg, the estimated right ventricular systolic pressure is normal at 27.4 mmHg.   TEE 03/21/2019:  1. Left ventricular ejection fraction, by visual estimation, is 55 to 60%. The left ventricle has normal function. Normal left ventricular size. There is no left ventricular hypertrophy.  2. Right ventricle has normal systolic function.The right ventricular size is normal. No increase in right ventricular wall thickness.  3. Both RA and RV pacer wires are normal. No vegetations.  4. Left atrial size was moderately dilated. Right atrial size was normal.  5. The mitral valve has been repaired/replaced. No evidence of mitral valve regurgitation. No evidence of mitral stenosis. Mechanical mitral valve is functioning normally. No abscess. No rocking.  6. The tricuspid valve is normal in structure. Tricuspid valve regurgitation is severe.  7. The aortic valve is tricuspid. Aortic valve regurgitation is mild. Mild to moderate aortic valve sclerosis/calcification without any evidence of aortic stenosis.  8. The pulmonic valve was normal in structure. Pulmonic valve regurgitation is mild.  9. The inferior vena cava is normal in size with greater than 50%  respiratory variability, suggesting right atrial pressure of 3 mmHg.  10. No vegetations. No endocarditis.   ECG: Atrial-biventricular sequential pacing (QRS 177ms) - personally reviewed.  Assessment & Plan    Acute on chronic HFpEF: Unclear true dry weight, but seems to be up as much as 20 lb over the past month. JVP is confounded by his severe TR, but still markedly elevated. It seems there may be progression of the TR, maybe under-diuresed during his last admission. There are also significant discrepancies between echos regarding his RV size/function, possible aortic stenosis, and degree of LV hypertrophy (previously reported as severe, last TEE reports normal LV  wall thickness). Although he did  have some improvement in his LV function initially, his QRS failed to narrow after CRT upgrade (still at least 122ms), possibly contributing to his recurrent decompensation. Additionally, he is not on PAP despite suspected OSA/OHS. Will plan for aggressive diuresis first, followed by reassessment of RV function, TR, aortic valve, and possibly invasive hemodynamics to assess filling pressures once more euvolemic. - Lasix 120mg  IV bolus + infusion at 10 mg/hr.  - Goal at least 2L negative daily. Intermittent metolazone if needed - Consider TTE and RHC once he seems euvolemic, as above - Monitor BMP twice daily and Mag daily with high-volume diuresis - Please obtain strict I&Os and daily standing weight - BP currently at goal  VHD: Mechanical MVR, severe TR, calcific native aortic valve disease (previously repaired per report). On chronic warfarin/aspirin, supratherapeutic INR in setting of AKI - Mechanical valve seems to be working well - Evaluate etiology of TR on TTE as above - ie functional, related to device lead, etc - Check daily INR and hold warfarin until INR is < 3.5  Suspected OSA/OHS:  Monitor nocturnal oximetry once volume status begins to improve and consider need for BiPAP QHS while admitted. Needs outpatient sleep study.  Hyperkalemia: per primary team. Should improve with diuresis as well. AKI is likely cardio-renal. Monitor on telemetry.  Signed, Marykay Lex, MD 05/13/2019, 10:17 PM  For questions or updates, please contact   Please consult [email or web address] for contact info under Cardiology/STEMI.

## 2019-05-13 NOTE — Telephone Encounter (Signed)
Phone call from Mr [facility] home health nurse, Santiago Glad as she is currently in the home with the pt.  She reports pt is even more SOB than he was when he spoke with this RN earlier.  Also reports 5 pound weight gain.  Advised pt should go to ER now.  Pt is agreeable.  Verbalizes understanding and agrees with current plan.

## 2019-05-13 NOTE — Telephone Encounter (Signed)
Pt c/o swelling: STAT is pt has developed SOB within 24 hours  1) How much weight have you gained and in what time span? 5 lbs in 24 hrs  2) If swelling, where is the swelling located?   3) Are you currently taking a fluid pill? yes  4) Are you currently SOB? yes  5) Do you have a log of your daily weights (if so, list)?   6) Have you gained 3 pounds in a day or 5 pounds in a week? 5 pounds overnight   7) Have you traveled recently? No  Home Health nurse is with the pt now. The pt is having SOB and is uncomfortable sitting in a chair. Pt has to lean to be able to get a deep breath. HHN wants to know if the pt needs to go to the ER

## 2019-05-14 ENCOUNTER — Other Ambulatory Visit: Payer: Self-pay | Admitting: *Deleted

## 2019-05-14 ENCOUNTER — Encounter (HOSPITAL_COMMUNITY): Payer: Self-pay | Admitting: Internal Medicine

## 2019-05-14 DIAGNOSIS — I428 Other cardiomyopathies: Secondary | ICD-10-CM

## 2019-05-14 DIAGNOSIS — I5033 Acute on chronic diastolic (congestive) heart failure: Secondary | ICD-10-CM

## 2019-05-14 DIAGNOSIS — Z9889 Other specified postprocedural states: Secondary | ICD-10-CM

## 2019-05-14 DIAGNOSIS — N179 Acute kidney failure, unspecified: Secondary | ICD-10-CM

## 2019-05-14 DIAGNOSIS — N1832 Chronic kidney disease, stage 3b: Secondary | ICD-10-CM

## 2019-05-14 DIAGNOSIS — Z952 Presence of prosthetic heart valve: Secondary | ICD-10-CM

## 2019-05-14 DIAGNOSIS — E875 Hyperkalemia: Secondary | ICD-10-CM | POA: Diagnosis present

## 2019-05-14 DIAGNOSIS — I50813 Acute on chronic right heart failure: Secondary | ICD-10-CM

## 2019-05-14 DIAGNOSIS — I890 Lymphedema, not elsewhere classified: Secondary | ICD-10-CM

## 2019-05-14 DIAGNOSIS — I272 Pulmonary hypertension, unspecified: Secondary | ICD-10-CM

## 2019-05-14 DIAGNOSIS — Z9189 Other specified personal risk factors, not elsewhere classified: Secondary | ICD-10-CM

## 2019-05-14 DIAGNOSIS — R5381 Other malaise: Secondary | ICD-10-CM

## 2019-05-14 DIAGNOSIS — Z8679 Personal history of other diseases of the circulatory system: Secondary | ICD-10-CM

## 2019-05-14 DIAGNOSIS — Z950 Presence of cardiac pacemaker: Secondary | ICD-10-CM

## 2019-05-14 DIAGNOSIS — I5031 Acute diastolic (congestive) heart failure: Secondary | ICD-10-CM

## 2019-05-14 DIAGNOSIS — L03115 Cellulitis of right lower limb: Secondary | ICD-10-CM

## 2019-05-14 DIAGNOSIS — R29898 Other symptoms and signs involving the musculoskeletal system: Secondary | ICD-10-CM

## 2019-05-14 LAB — COMPREHENSIVE METABOLIC PANEL
ALT: 15 U/L (ref 0–44)
AST: 40 U/L (ref 15–41)
Albumin: 2.5 g/dL — ABNORMAL LOW (ref 3.5–5.0)
Alkaline Phosphatase: 140 U/L — ABNORMAL HIGH (ref 38–126)
Anion gap: 12 (ref 5–15)
BUN: 89 mg/dL — ABNORMAL HIGH (ref 8–23)
CO2: 29 mmol/L (ref 22–32)
Calcium: 8.8 mg/dL — ABNORMAL LOW (ref 8.9–10.3)
Chloride: 91 mmol/L — ABNORMAL LOW (ref 98–111)
Creatinine, Ser: 2.77 mg/dL — ABNORMAL HIGH (ref 0.61–1.24)
GFR calc Af Amer: 25 mL/min — ABNORMAL LOW (ref >60)
GFR calc non Af Amer: 22 mL/min — ABNORMAL LOW (ref >60)
Glucose, Bld: 94 mg/dL (ref 70–99)
Potassium: 6.4 mmol/L (ref 3.5–5.1)
Sodium: 132 mmol/L — ABNORMAL LOW (ref 135–145)
Total Bilirubin: 1.4 mg/dL — ABNORMAL HIGH (ref 0.3–1.2)
Total Protein: 7.2 g/dL (ref 6.5–8.1)

## 2019-05-14 LAB — CBC
HCT: 21.9 % — ABNORMAL LOW (ref 39.0–52.0)
Hemoglobin: 6.4 g/dL — CL (ref 13.0–17.0)
MCH: 27.9 pg (ref 26.0–34.0)
MCHC: 29.2 g/dL — ABNORMAL LOW (ref 30.0–36.0)
MCV: 95.6 fL (ref 80.0–100.0)
Platelets: 257 10*3/uL (ref 150–400)
RBC: 2.29 MIL/uL — ABNORMAL LOW (ref 4.22–5.81)
RDW: 19 % — ABNORMAL HIGH (ref 11.5–15.5)
WBC: 7.7 10*3/uL (ref 4.0–10.5)
nRBC: 0.4 % — ABNORMAL HIGH (ref 0.0–0.2)

## 2019-05-14 LAB — PROTIME-INR
INR: 7.6 (ref 0.8–1.2)
INR: 6.6 (ref 0.8–1.2)
Prothrombin Time: 64.5 seconds — ABNORMAL HIGH (ref 11.4–15.2)
Prothrombin Time: 57.7 seconds — ABNORMAL HIGH (ref 11.4–15.2)

## 2019-05-14 LAB — IRON AND TIBC
Iron: 67 ug/dL (ref 45–182)
Saturation Ratios: 15 % — ABNORMAL LOW (ref 17.9–39.5)
TIBC: 435 ug/dL (ref 250–450)
UIBC: 368 ug/dL

## 2019-05-14 LAB — RENAL FUNCTION PANEL
Albumin: 2.4 g/dL — ABNORMAL LOW (ref 3.5–5.0)
Anion gap: 10 (ref 5–15)
BUN: 93 mg/dL — ABNORMAL HIGH (ref 8–23)
CO2: 29 mmol/L (ref 22–32)
Calcium: 8.6 mg/dL — ABNORMAL LOW (ref 8.9–10.3)
Chloride: 90 mmol/L — ABNORMAL LOW (ref 98–111)
Creatinine, Ser: 2.76 mg/dL — ABNORMAL HIGH (ref 0.61–1.24)
GFR calc Af Amer: 26 mL/min — ABNORMAL LOW (ref >60)
GFR calc non Af Amer: 22 mL/min — ABNORMAL LOW (ref >60)
Glucose, Bld: 100 mg/dL — ABNORMAL HIGH (ref 70–99)
Phosphorus: 4.6 mg/dL (ref 2.5–4.6)
Potassium: 6 mmol/L — ABNORMAL HIGH (ref 3.5–5.1)
Sodium: 129 mmol/L — ABNORMAL LOW (ref 135–145)

## 2019-05-14 LAB — CBC WITH DIFFERENTIAL/PLATELET
Abs Immature Granulocytes: 0.07 10*3/uL (ref 0.00–0.07)
Basophils Absolute: 0 10*3/uL (ref 0.0–0.1)
Basophils Relative: 1 %
Eosinophils Absolute: 0.1 10*3/uL (ref 0.0–0.5)
Eosinophils Relative: 2 %
HCT: 21.4 % — ABNORMAL LOW (ref 39.0–52.0)
Hemoglobin: 6.5 g/dL — CL (ref 13.0–17.0)
Immature Granulocytes: 1 %
Lymphocytes Relative: 10 %
Lymphs Abs: 0.8 10*3/uL (ref 0.7–4.0)
MCH: 28.8 pg (ref 26.0–34.0)
MCHC: 30.4 g/dL (ref 30.0–36.0)
MCV: 94.7 fL (ref 80.0–100.0)
Monocytes Absolute: 1.6 10*3/uL — ABNORMAL HIGH (ref 0.1–1.0)
Monocytes Relative: 20 %
Neutro Abs: 5.5 10*3/uL (ref 1.7–7.7)
Neutrophils Relative %: 66 %
Platelets: 266 10*3/uL (ref 150–400)
RBC: 2.26 MIL/uL — ABNORMAL LOW (ref 4.22–5.81)
RDW: 18.6 % — ABNORMAL HIGH (ref 11.5–15.5)
WBC: 8.2 10*3/uL (ref 4.0–10.5)
nRBC: 0.2 % (ref 0.0–0.2)

## 2019-05-14 LAB — VITAMIN B12: Vitamin B-12: 2244 pg/mL — ABNORMAL HIGH (ref 180–914)

## 2019-05-14 LAB — FERRITIN: Ferritin: 40 ng/mL (ref 24–336)

## 2019-05-14 LAB — RETICULOCYTES
Immature Retic Fract: 37.1 % — ABNORMAL HIGH (ref 2.3–15.9)
RBC.: 2.2 MIL/uL — ABNORMAL LOW (ref 4.22–5.81)
Retic Count, Absolute: 93.5 10*3/uL (ref 19.0–186.0)
Retic Ct Pct: 4.3 % — ABNORMAL HIGH (ref 0.4–3.1)

## 2019-05-14 LAB — GLUCOSE, CAPILLARY: Glucose-Capillary: 96 mg/dL (ref 70–99)

## 2019-05-14 LAB — PREPARE RBC (CROSSMATCH)

## 2019-05-14 LAB — FOLATE: Folate: 11.4 ng/mL (ref >5.9)

## 2019-05-14 LAB — TSH: TSH: 4.695 u[IU]/mL — ABNORMAL HIGH (ref 0.350–4.500)

## 2019-05-14 MED ORDER — ACETAMINOPHEN 650 MG RE SUPP: 650.0000 mg | Freq: Four times a day (QID) | RECTAL | Status: DC | PRN

## 2019-05-14 MED ORDER — ORAL CARE MOUTH RINSE
15.0000 mL | Freq: Two times a day (BID) | OROMUCOSAL | Status: DC
  Administered 2019-05-14 – 2019-05-26 (×21): 15 mL via OROMUCOSAL

## 2019-05-14 MED ORDER — WARFARIN - PHARMACIST DOSING INPATIENT: Freq: Every day | Status: DC

## 2019-05-14 MED ORDER — FERROUS SULFATE 325 (65 FE) MG PO TABS
325.0000 mg | ORAL_TABLET | Freq: Every day | ORAL | Status: DC
  Administered 2019-05-14 – 2019-05-23 (×10): 325 mg via ORAL
  Filled 2019-05-14 (×10): qty 1

## 2019-05-14 MED ORDER — MAGNESIUM OXIDE 400 (241.3 MG) MG PO TABS
400.0000 mg | ORAL_TABLET | Freq: Every evening | ORAL | Status: DC
  Administered 2019-05-14: 18:00:00 400 mg via ORAL
  Filled 2019-05-14: qty 1

## 2019-05-14 MED ORDER — FUROSEMIDE 10 MG/ML IJ SOLN
30.0000 mg/h | INTRAVENOUS | Status: DC
  Administered 2019-05-14 – 2019-05-15 (×2): 10 mg/h via INTRAVENOUS
  Administered 2019-05-16 – 2019-05-17 (×3): 20 mg/h via INTRAVENOUS
  Administered 2019-05-17: 25 mg/h via INTRAVENOUS
  Administered 2019-05-18: 30 mg/h via INTRAVENOUS
  Administered 2019-05-18: 25 mg/h via INTRAVENOUS
  Administered 2019-05-19 – 2019-05-23 (×9): 30 mg/h via INTRAVENOUS
  Filled 2019-05-14: qty 21
  Filled 2019-05-14 (×2): qty 25
  Filled 2019-05-14 (×2): qty 21
  Filled 2019-05-14 (×4): qty 25
  Filled 2019-05-14: qty 21
  Filled 2019-05-14 (×10): qty 25

## 2019-05-14 MED ORDER — ACETAMINOPHEN 325 MG PO TABS
650.0000 mg | ORAL_TABLET | Freq: Four times a day (QID) | ORAL | Status: DC | PRN
  Administered 2019-05-14: 10:00:00 650 mg via ORAL
  Filled 2019-05-14: qty 2

## 2019-05-14 MED ORDER — PANTOPRAZOLE SODIUM 40 MG PO TBEC
40.0000 mg | DELAYED_RELEASE_TABLET | Freq: Every day | ORAL | Status: DC
  Administered 2019-05-14 – 2019-05-15 (×2): 40 mg via ORAL
  Filled 2019-05-14 (×2): qty 1

## 2019-05-14 MED ORDER — OXYCODONE HCL 5 MG PO TABS
5.0000 mg | ORAL_TABLET | Freq: Four times a day (QID) | ORAL | Status: DC | PRN
  Administered 2019-05-14 (×2): 5 mg via ORAL
  Filled 2019-05-14 (×2): qty 1

## 2019-05-14 MED ORDER — ACETAMINOPHEN 500 MG PO TABS
1000.0000 mg | ORAL_TABLET | Freq: Three times a day (TID) | ORAL | Status: DC
  Administered 2019-05-14 – 2019-05-26 (×36): 1000 mg via ORAL
  Filled 2019-05-14 (×37): qty 2

## 2019-05-14 MED ORDER — SODIUM ZIRCONIUM CYCLOSILICATE 10 G PO PACK
10.0000 g | PACK | Freq: Once | ORAL | Status: AC
  Administered 2019-05-14: 08:00:00 10 g via ORAL
  Filled 2019-05-14: qty 1

## 2019-05-14 MED ORDER — AMOXICILLIN 500 MG PO CAPS
500.0000 mg | ORAL_CAPSULE | Freq: Three times a day (TID) | ORAL | Status: DC
  Administered 2019-05-14 – 2019-05-20 (×19): 500 mg via ORAL
  Filled 2019-05-14 (×20): qty 1

## 2019-05-14 MED ORDER — FUROSEMIDE 10 MG/ML IJ SOLN
120.0000 mg | Freq: Once | INTRAVENOUS | Status: AC
  Administered 2019-05-14: 02:00:00 120 mg via INTRAVENOUS
  Filled 2019-05-14: qty 10

## 2019-05-14 MED ORDER — PHYTONADIONE 5 MG PO TABS
2.5000 mg | ORAL_TABLET | Freq: Once | ORAL | Status: AC
  Administered 2019-05-14: 08:00:00 2.5 mg via ORAL
  Filled 2019-05-14: qty 1

## 2019-05-14 MED ORDER — HYDROCODONE-ACETAMINOPHEN 5-325 MG PO TABS
1.0000 | ORAL_TABLET | Freq: Three times a day (TID) | ORAL | Status: DC | PRN
  Administered 2019-05-14: 1 via ORAL
  Filled 2019-05-14: qty 1

## 2019-05-14 MED ORDER — MAGNESIUM OXIDE 400 (241.3 MG) MG PO TABS
600.0000 mg | ORAL_TABLET | Freq: Every day | ORAL | Status: DC
  Administered 2019-05-14 – 2019-05-16 (×2): 600 mg via ORAL
  Filled 2019-05-14 (×2): qty 2

## 2019-05-14 MED ORDER — SODIUM CHLORIDE 0.9% IV SOLUTION: Freq: Once | INTRAVENOUS | Status: DC

## 2019-05-14 NOTE — Patient Outreach (Signed)
[facility]) Care Management [place] Telephone [facility] Coordination  05/14/2019  PAXTON BINNS [birth year] [id number]  [facility] CM Telephone Outreach Care Coordination re:  Shelia Magallon, 74 y/o male referred to [facility] CM12/28/2020by [facility] CMA after EMMI Red Alert for General discharge. Patient had recent hospitalization December 6-21, 2020 for weakness/ AKI, pulmonary edema due to sepsis thought to be related to (R) lower extremity cellulitis. Patient was discharged home to self-care with home health services for PT and RN in place through [place].Patient has history including, but not limited to, combined CHF with cardiomyopathy; previous MVR/ AVR with pacemaker; HTN; CKD- III; A-Fib on ACT; and OSA.  Secure communication via EMR received from [place] notifying of patient's new hospital admission yesterday for CHF exacerbation; current admission occurred 43 days after patient's last hospital discharge.  Plan:  [facility] RN CM will follow patient's progress while hospitalized and collaborate with [facility] liaison's for discharge disposition.  Oneta Rack, RN, BSN, [employer] [facility] Care Management  [phone]

## 2019-05-14 NOTE — Progress Notes (Signed)
Patient refused CPAP.

## 2019-05-14 NOTE — Progress Notes (Signed)
PROGRESS NOTE  Frank [name] SNK:[id number] DOB: [birth year]   PCP: [name], MD  Patient is from: Home.  Lives with his wife.  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 Fib, lymphedema, CKD-4, debility, morbid obesity, recurrent hospitalization for sepsis secondary to Streptococcus from RLE cellulitis presenting with progressive dyspnea and weight gain.   In ED, hemodynamically stable except for brief tachycardia to 124.  Saturation 98% on room air. CXR consistent with CHF.  K7.0. Cr 2.67 (baseline~1.8).  INR 7.2.  Hgb 7.2 (baseline about 8.0).  EKG revealed paced rhythm.  COVID-19 negative.  Received IV Lasix 120 mg once and was started on Lasix drip by cardiologist.  Also received bicarb, IV insulin and D50, and admitted for CHF exacerbation, AKI, hyperkalemia, supratherapeutic INR and anemia.  Patient received additional dose of Lokelma 10 g and oral vitamin K 2.5 mg.   Subjective: Admitted early this morning.  Complains pain in his lower back and backside.  Feels tired.  Denies chest pain or dyspnea.  Denies GI or UTI symptoms.  Endorses dark stool but on iron.  Reports occasional hematochezia from his hemorrhoid.  Patient's wife at bedside. Asked questions and concern about infection.   Objective: Vitals:   05/14/19 0657 05/14/19 0659 05/14/19 0803 05/14/19 1132  BP: (!) 119/40  (!) 120/53 (!) 133/40  Pulse: 61 60 60 64  Resp: 19 18    Temp:   98.1 F (36.7 C) 97.9 F (36.6 C)  TempSrc:   Oral Oral  SpO2: 91% (!) 88% 100% 97%  Weight:      Height:        Intake/Output Summary (Last 24 hours) at 05/14/2019 1555 Last data filed at 05/14/2019 1300 Gross per 24 hour  Intake 680.35 ml  Output 800 ml  Net -119.65 ml   Filed Weights   05/14/19 0027 05/14/19 0654  Weight: (!) 157.4 kg (!) 157.4 kg    Examination:  GENERAL: No acute distress.  Appears well.  HEENT:  MMM.  Vision and hearing grossly intact.  NECK: Supple.  No apparent JVD but difficult exam due to body habitus.Marland Kitchen  RESP:  No IWOB.  Fair aeration bilaterally. CVS:  RRR.  2/6 SEM over RUSB and LUSB. ABD/GI/GU: Bowel sounds present. Soft. Non tender.  MSK/EXT: Bilateral lower extremity lymphedema with erythema. SKIN: Erythema in both LEs.  Tree bark skin change in RLE. NEURO: Awake and oriented appropriately.  No apparent focal neuro deficit other than generalized weakness. PSYCH: Calm. Normal affect.   Procedures:  None  Assessment & Plan: Acute on chronic diastolic CHF: Echo in 93/79 with EF of 55 to 60%.  Has significant fluid overload with massive lymphedema.  Presented with dyspnea and weight gain.  CXR concerning for CHF.  -On IV Lasix drip per cardiology -Monitor fluid status, renal function and electrolytes -Sodium restrictions.  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  -Hyperkalemia improving-expect further improvement with IV diuretics as above. -Continue monitoring. -If no improvement in renal function, will obtain renal ultrasound.  Supratherapeutic INR: patient with supratherapeutic INR on warfarin likely due to renal failure. -Received p.o. vitamin K 2.5 mg.  INR improved.  Continue monitoring -Warfarin on hold.  Mechanical MVR/AVR: INR supratherapeutic. -Hold warfarin  At risk for sleep apnea/OHS: Body mass index is 48.4 kg/m. -Nightly CPAP  Recent hospitalization for group G Streptococcus bacteremia from RLE cellulitis in patient with lymphedema -  Completed antibiotic course with penicillin G followed by oral Zyvox for a total of 14 days through 12/22. -Evaluated by ID, Dr. Tommy Medal on 1/11 and prescribed Augmentin for 2 more weeks with refill. -Will continue oral amoxicillin and Florastor. -Obtain blood culture.  Paroxysmal atrial fibrillation: NSR.  Not on rate or rhythm control. -Warfarin on hold in the setting of supratherapeutic  INR.  Acute on chronic blood loss anemia in patient with anemia of chronic disease-this is a combination of chronic bleeding from his hemorrhoid and anemia of chronic disease from renal failure.  -Hgb ~8.0 (baseline)> 7.2 (admit)> 6.5>2u   Chronic pain:  -Scheduled Tylenol with as needed oxycodone.   Generalized weakness/debility  Lymphedema/possible right LE cellulitis -Diuretics and antibiotics as above -Appreciate WOC  Morbid obesity: Body mass index is 48.4 kg/m. -Encourage lifestyle change to lose weight        DVT prophylaxis: None.  INR supratherapeutic. Code Status: Full code-confirmed with patient. Family Communication: Updated patient's wife at bedside.   Discharge barrier: Multiple medical issues as above Patient is from: Home Final disposition: TBD based on clinical course and therapy evaluation.  Consultants: Cardiology   Microbiology summarized: OYDXA-12 and influenza PCR negative. Blood cultures pending.  Sch Meds:  Scheduled Meds: . sodium chloride   Intravenous Once  . acetaminophen  1,000 mg Oral Q8H  . ferrous sulfate  325 mg Oral Q breakfast  . magnesium oxide  400 mg Oral QPM  . magnesium oxide  600 mg Oral Daily  . mouth rinse  15 mL Mouth Rinse BID  . pantoprazole  40 mg Oral Daily  . Warfarin - Pharmacist Dosing Inpatient   Does not apply q1800   Continuous Infusions: . furosemide (LASIX) infusion 10 mg/hr (05/14/19 0233)   PRN Meds:.oxyCODONE  Antimicrobials: Anti-infectives (From admission, onward)   None       I have personally reviewed the following labs and images: CBC: Recent Labs  Lab 05/13/19 1759 05/14/19 0435 05/14/19 0819  WBC 8.2 8.2 7.7  NEUTROABS  --  5.5  --   HGB 7.2* 6.5* 6.4*  HCT 25.3* 21.4* 21.9*  MCV 100.0 94.7 95.6  PLT 280 266 257   BMP &GFR Recent Labs  Lab 05/13/19 1759 05/14/19 0435 05/14/19 1210  NA 131* 132* 129*  K 7.0* 6.4* 6.0*  CL 89* 91* 90*  CO2 28 29 29   GLUCOSE 105* 94 100*   BUN 87* 89* 93*  CREATININE 2.67* 2.77* 2.76*  CALCIUM 9.0 8.8* 8.6*  PHOS  --   --  4.6   Estimated Creatinine Clearance: 37.5 mL/min (A) (by C-G formula based on SCr of 2.76 mg/dL (H)). Liver & Pancreas: Recent Labs  Lab 05/14/19 0435 05/14/19 1210  AST 40  --   ALT 15  --   ALKPHOS 140*  --   BILITOT 1.4*  --   PROT 7.2  --   ALBUMIN 2.5* 2.4*   No results for input(s): LIPASE, AMYLASE in the last 168 hours. No results for input(s): AMMONIA in the last 168 hours. Diabetic: No results for input(s): HGBA1C in the last 72 hours. Recent Labs  Lab 05/14/19 1128  GLUCAP 96   Cardiac Enzymes: No results for input(s): CKTOTAL, CKMB, CKMBINDEX, TROPONINI in the last 168 hours. No results for input(s): PROBNP in the last 8760 hours. Coagulation Profile: Recent Labs  Lab 05/13/19 2133 05/14/19 0435 05/14/19 1428  INR 7.2* 7.6* 6.6*   Thyroid Function Tests: Recent Labs    05/14/19  0435  TSH 4.695*   Lipid Profile: No results for input(s): CHOL, HDL, LDLCALC, TRIG, CHOLHDL, LDLDIRECT in the last 72 hours. Anemia Panel: No results for input(s): VITAMINB12, FOLATE, FERRITIN, TIBC, IRON, RETICCTPCT in the last 72 hours. Urine analysis:    Component Value Date/Time   COLORURINE YELLOW 03/17/2019 0054   APPEARANCEUR CLOUDY (A) 03/17/2019 0054   LABSPEC 1.006 03/17/2019 0054   PHURINE 6.0 03/17/2019 0054   GLUCOSEU NEGATIVE 03/17/2019 0054   HGBUR SMALL (A) 03/17/2019 0054   BILIRUBINUR NEGATIVE 03/17/2019 0054   KETONESUR NEGATIVE 03/17/2019 0054   PROTEINUR 30 (A) 03/17/2019 0054   NITRITE NEGATIVE 03/17/2019 0054   LEUKOCYTESUR LARGE (A) 03/17/2019 0054   Sepsis Labs: Invalid input(s): PROCALCITONIN, Wilsey  Microbiology: Recent Results (from the past 240 hour(s))  Respiratory Panel by RT PCR (Flu A&B, Covid) - Nasopharyngeal Swab     Status: None   Collection Time: 05/13/19  8:19 PM   Specimen: Nasopharyngeal Swab  Result Value Ref Range Status    SARS Coronavirus 2 by RT PCR NEGATIVE NEGATIVE Final    Comment: (NOTE) SARS-CoV-2 target nucleic acids are NOT DETECTED. The SARS-CoV-2 RNA is generally detectable in upper respiratoy specimens during the acute phase of infection. The lowest concentration of SARS-CoV-2 viral copies this assay can detect is 131 copies/mL. A negative result does not preclude SARS-Cov-2 infection and should not be used as the sole basis for treatment or other patient management decisions. A negative result may occur with  improper specimen collection/handling, submission of specimen other than nasopharyngeal swab, presence of viral mutation(s) within the areas targeted by this assay, and inadequate number of viral copies (<131 copies/mL). A negative result must be combined with clinical observations, patient history, and epidemiological information. The expected result is Negative. Fact Sheet for Patients:  PinkCheek.be Fact Sheet for Healthcare Providers:  GravelBags.it This test is not yet ap proved or cleared by the [place] FDA and  has been authorized for detection and/or diagnosis of SARS-CoV-2 by FDA under an Emergency Use Authorization (EUA). This EUA will remain  in effect (meaning this test can be used) for the duration of the COVID-19 declaration under Section 564(b)(1) of the Act, 21 U.S.C. section 360bbb-3(b)(1), unless the authorization is terminated or revoked sooner.    Influenza A by PCR NEGATIVE NEGATIVE Final   Influenza B by PCR NEGATIVE NEGATIVE Final    Comment: (NOTE) The Xpert Xpress SARS-CoV-2/FLU/RSV assay is intended as an aid in  the diagnosis of influenza from Nasopharyngeal swab specimens and  should not be used as a sole basis for treatment. Nasal washings and  aspirates are unacceptable for Xpert Xpress SARS-CoV-2/FLU/RSV  testing. Fact Sheet for Patients: PinkCheek.be Fact  Sheet for Healthcare Providers: GravelBags.it This test is not yet approved or cleared by the [place] FDA and  has been authorized for detection and/or diagnosis of SARS-CoV-2 by  FDA under an Emergency Use Authorization (EUA). This EUA will remain  in effect (meaning this test can be used) for the duration of the  Covid-19 declaration under Section 564(b)(1) of the Act, 21  U.S.C. section 360bbb-3(b)(1), unless the authorization is  terminated or revoked. Performed at [facility], [place] [address], [place], [place] [address]     Radiology Studies: DG Chest 2 View  Result Date: 05/13/2019 CLINICAL DATA:  Shortness of breath EXAM: CHEST - 2 VIEW COMPARISON:  March 16, 2019 FINDINGS: There is a dual lead AICD. Multiple sternal wires are present. Mild, chronic  appearing increased interstitial lung markings are seen. Very mild atelectasis is noted within the bilateral lung bases. There is no evidence of a pleural effusion or pneumothorax. The cardiac silhouette is markedly enlarged. Multilevel degenerative changes seen throughout the thoracic spine. IMPRESSION: 1. Marked cardiomegaly. 2. Mild, chronic appearing increased interstitial lung markings. A mild superimposed component of interstitial edema cannot be excluded. 3. Mild bibasilar atelectasis. Electronically Signed   By: Virgina Norfolk M.D.   On: 05/13/2019 18:52   45 minutes with more than 50% spent in reviewing records, counseling patient/family and coordinating care.   Marivel Mcclarty T. Naranja  If 7PM-7AM, please contact night-coverage [email or web address] Password [facility] 05/14/2019, 3:55 PM

## 2019-05-14 NOTE — Progress Notes (Addendum)
Progress Note  Patient Name: Frank [name] Date of Encounter: 05/14/2019  Primary Cardiologist: [name], MD   Subjective   Appearing sick. No complains. Sleepy.   Inpatient Medications    Scheduled Meds: . sodium chloride   Intravenous Once  . ferrous sulfate  325 mg Oral Q breakfast  . magnesium oxide  400 mg Oral QPM  . magnesium oxide  600 mg Oral Daily  . mouth rinse  15 mL Mouth Rinse BID  . pantoprazole  40 mg Oral Daily  . sodium chloride flush  3 mL Intravenous Once  . Warfarin - Pharmacist Dosing Inpatient   Does not apply q1800   Continuous Infusions: . furosemide (LASIX) infusion 10 mg/hr (05/14/19 0233)   PRN Meds: acetaminophen **OR** acetaminophen, HYDROcodone-acetaminophen   Vital Signs    Vitals:   05/14/19 0654 05/14/19 0657 05/14/19 0659 05/14/19 0803  BP:  (!) 119/40  (!) 120/53  Pulse: 62 61 60 60  Resp: 15 19 18    Temp:    98.1 F (36.7 C)  TempSrc:    Oral  SpO2: 92% 91% (!) 88% 100%  Weight: (!) 157.4 kg     Height:        Intake/Output Summary (Last 24 hours) at 05/14/2019 0940 Last data filed at 05/14/2019 0656 Gross per 24 hour  Intake 320.35 ml  Output 800 ml  Net -479.65 ml   Last 3 Weights 05/14/2019 05/14/2019 05/02/2019  Weight (lbs) 347 lb 0.1 oz 347 lb 0.1 oz 334 lb  Weight (kg) 157.4 kg 157.4 kg 151.501 kg      Telemetry    AV paced rhythm - Personally Reviewed  ECG    No new tracing   Physical Exam   GEN: pale obese ill appearing elderly male in no acute distress.   Neck: + JVD Cardiac: RRR, + murmurs, rubs, or gallops.  Respiratory: Clear to auscultation bilaterally. GI: Soft, nontender, non-distended  MS: 2-3 +edema; No deformity. Neuro:  Nonfocal  Psych: Normal affect   Labs    High Sensitivity Troponin:   Recent Labs  Lab 05/13/19 1930 05/13/19 2133  TROPONINIHS 53* 58*      Chemistry Recent Labs  Lab 05/13/19 1759 05/14/19 0435  NA 131* 132*  K 7.0* 6.4*  CL 89* 91*  CO2 28 29    GLUCOSE 105* 94  BUN 87* 89*  CREATININE 2.67* 2.77*  CALCIUM 9.0 8.8*  PROT  --  7.2  ALBUMIN  --  2.5*  AST  --  40  ALT  --  15  ALKPHOS  --  140*  BILITOT  --  1.4*  GFRNONAA 23* 22*  GFRAA 27* 25*  ANIONGAP 14 12     Hematology Recent Labs  Lab 05/13/19 1759 05/14/19 0435 05/14/19 0819  WBC 8.2 8.2 7.7  RBC 2.53* 2.26* 2.29*  HGB 7.2* 6.5* 6.4*  HCT 25.3* 21.4* 21.9*  MCV 100.0 94.7 95.6  MCH 28.5 28.8 27.9  MCHC 28.5* 30.4 29.2*  RDW 18.8* 18.6* 19.0*  PLT 280 266 257    BNP Recent Labs  Lab 05/13/19 1930  BNP 163.6*     DDimer No results for input(s): DDIMER in the last 168 hours.   Radiology    DG Chest 2 View  Result Date: 05/13/2019 CLINICAL DATA:  Shortness of breath EXAM: CHEST - 2 VIEW COMPARISON:  March 16, 2019 FINDINGS: There is a dual lead AICD. Multiple sternal wires are present. Mild, chronic appearing increased interstitial  lung markings are seen. Very mild atelectasis is noted within the bilateral lung bases. There is no evidence of a pleural effusion or pneumothorax. The cardiac silhouette is markedly enlarged. Multilevel degenerative changes seen throughout the thoracic spine. IMPRESSION: 1. Marked cardiomegaly. 2. Mild, chronic appearing increased interstitial lung markings. A mild superimposed component of interstitial edema cannot be excluded. 3. Mild bibasilar atelectasis. Electronically Signed   By: Virgina Norfolk M.D.   On: 05/13/2019 18:52    Cardiac Studies   Transesophageal Echo 03/21/2019 IMPRESSIONS    1. Left ventricular ejection fraction, by visual estimation, is 55 to  60%. The left ventricle has normal function. Normal left ventricular size.  There is no left ventricular hypertrophy.  2. The left ventricle has no regional wall motion abnormalities.  3. Global right ventricle has normal systolic function.The right  ventricular size is normal. No increase in right ventricular wall  thickness.  4. Both RA and RV  pacer wires are normal. No vegetations.  5. Left atrial size was moderately dilated.  6. Right atrial size was normal.  7. The mitral valve has been repaired/replaced. No evidence of mitral  valve regurgitation. No evidence of mitral stenosis.  8. Mechanical mitral valve is functioning normally. No abscess. No  rocking.  9. The tricuspid valve is normal in structure. Tricuspid valve  regurgitation is severe.  10. The aortic valve is tricuspid. Aortic valve regurgitation is mild.  Mild to moderate aortic valve sclerosis/calcification without any evidence  of aortic stenosis.  11. The pulmonic valve was normal in structure. Pulmonic valve  regurgitation is mild.  12. A pacer wire is visualized.  13. The inferior vena cava is normal in size with greater than 50%  respiratory variability, suggesting right atrial pressure of 3 mmHg.  14. No vegetations. No endocarditis.   TTE 03/19/19 1. No evidence of infective endocarditis on this study. A TEE is  recommended to exclude endocarditis.  2. The patient is s/p myomectomy with mechanical MVR and AoV repair  (reported in record). Overall, the EF remains unchanged with prior. The  mechanical MV prosthesis is functioning normally. The AoV is severely  calcified with restricted movement. I  suspect there is at least mild to moderate aortic stenosis, but doppler  interrogation on this study is suboptimal. The tricuspid regurgitation on  this study is severe with systolic hepatic vein flow reversal (not well  evaluated on last study). Findings  compared directly with study from 02/14/2019.  3. Left ventricular ejection fraction, by visual estimation, is 55 to  60%. The left ventricle has normal function. There is mildly increased  left ventricular hypertrophy.  4. Definity contrast agent was given IV to delineate the left ventricular  endocardial borders.  5. Left ventricular diastolic function could not be evaluated.  6. Right  ventricular volume overload.  7. The left ventricle has no regional wall motion abnormalities.  8. Global right ventricle has severely reduced systolic function.The  right ventricular size is severely enlarged. No increase in right  ventricular wall thickness.  9. Left atrial size was mild-moderately dilated.  10. Right atrial size was severely dilated.  11. The mitral valve has been repaired/replaced. No evidence of mitral  valve regurgitation.  12. Mechanical mitral valve present. Mean gradient 5 mmHG with heart rate  60 bpm. EOA 5 mmHG. Normal functioning prosthetic valve with stable  gradients compared with prior.  13. The tricuspid valve is grossly normal. Tricuspid valve regurgitation  is severe.  14. Aortic valve regurgitation  is mild. Mild to moderate aortic valve  stenosis.  15. The aortic valve is severely calcified with restricted leaflet motion.  Doppler interrogation is poor on this study. Vmax 2.5 m/s, Mean gradient  ~8.5 mmHG, but suspect this is severely underestimated. There is likely a  mild to moderate degree of  aortic stenosis present.  16. The pulmonic valve was grossly normal. Pulmonic valve regurgitation is  not visualized.  17. Mild plaque invoving the aortic root.  18. Normal pulmonary artery systolic pressure.  19. The tricuspid regurgitant velocity is 1.76 m/s, and with an assumed  right atrial pressure of 15 mmHg, the estimated right ventricular systolic  pressure is normal at 27.4 mmHg.  20. A pacer wire is visualized in the RA and RV.   Patient Profile     74 y.o. 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   Assessment & Plan    1. Acute on chronic diastolic CHF -His baseline weight is 335-340. His volume overload likely driven by anemia. Continue lasix drip. Net I & O negative 479cc. On foley.   2. Valvular heart disease and chronic anticoagulation  -  Mechanical MVR, severe TR, calcific native aortic valve disease  - Coumadin on hold for elevated INR, given vita K 2.5mg  x 1  3. Hyperkalemia - Given Lokema 10mg  x 2, follow closely on lasix drip  4. Acute on chronic anemia - Baseline hemoglobin around 8.5 - Hgb of 7.2>>6.5>>6.4 this morning - Plan to transfuse   5. Suspected OSA/OHS - Will need outpatient sleep study  6. Recent bacteremia 2nd to RLE cellulitis - Would looks okay  For questions or updates, please contact [place] Please consult [email or web address] for contact info under    Signed, Leanor Kail, PA  05/14/2019, 9:40 AM    I have seen and examined the patient along with Leanor Kail, PA .  I have reviewed the chart, notes and new data.  I agree with PA/NP's note.  Key new complaints: Appears very tired, falls asleep during conversation (with oxygen saturation correlating with his level of alertness).  Has been sleeping sitting up for "years".  He is not sure whether his degree of orthopnea is worse than usual. Key examination changes: Morbidly obese, severe chronic appearing brawny edema of the lower extremities, JVD to angle of jaw with prominent V waves, crisp prosthetic valve clicks, short early systolic murmur heard on either side of the sternal border consistent with severe tricuspid insufficiency. Key new findings / data: Reviewed previous transthoracic and transesophageal echocardiograms.  His TR is not just severe, but torrential.  Gradients across the mitral valve prosthesis were consistent with "moderate stenosis"  even when his hemoglobin was higher than it is on this admission (8-9 in December, 6-6.5 on this admission).  TEE shows normal movement of both discs of the prosthesis.  No evidence of pannus formation.  Increasing gradients are related to the anemia.  PLAN: - Suspect that hyperkalemia/acute on chronic renal insufficiency and coagulopathy are due to worsening right heart failure with diminished cardiac output. -Chronic right heart  failure is due to longstanding sleep disordered breathing and morbid obesity -Right heart failure is worsened by superimposed severe tricuspid insufficiency (partly functional, but also possibly related to numerous pacemaker leads and previous extraction) -Finally, current acute decompensation is probably due to increased pulmonary venous pressures due to increased gradients across the mitral valve prosthesis in the setting of severe anemia.  Suspect he will improve following blood transfusion. On intravenous furosemide continuous infusion and potassium supplements have been stopped. I do not think adjustment of his CRT-P settings will have any impact.  He has normal left ventricular systolic function, the problems are related to increased mitral bioprosthesis gradients and right heart failure. Needs treatment for OSA.  Sanda Klein, MD, [place] [phone] 05/14/2019, 10:14 AM

## 2019-05-14 NOTE — Consult Note (Signed)
[facility] Nurse Consult Note: Reason for Consult: Bilateral lower extremity venous insufficiency.  Right anterior lower leg bullae ruptured and moist.  Scattered circumferentially  Wound type:inflammatory Pressure Injury POA: NA Measurement: 8 cm x 8 cm erythema with scattered nonintact lesions to right lower leg  Wound MRA:JHHI and moist Drainage (amount, consistency, odor) minimal weeping   Periwound:edema to bilateral lower legs.  Erythema and chronic skin changes.  Dressing procedure/placement/frequency: Cleanse bilateral lower legs with soap and water and pat dry.  Apply xeroform to blisters on right lower leg.  COver with abd pads.  Wrap both legs with kerlix from below toes to below knee. Secure with self adherent coban.  Change daily.  Bedside RN to perform.   Will not follow at this time.  Please re-consult if needed.  Domenic Moras MSN, RN, FNP-BC [facility] Wound, Ostomy, Continence Nurse Pager [phone]

## 2019-05-14 NOTE — Progress Notes (Signed)
ANTICOAGULATION CONSULT NOTE - Initial Consult  Pharmacy Consult for Coumadin Indication: Atrial fibrillation and mechanical heart valve  Allergies  Allergen Reactions  . Warfarin And Related Other (See Comments)    COUMADIN or JANTOVEN BRAND ONLY** Per pt, was switched to generic in 2001 and had significantly decreased absorption > INR subtherapeutic> pt suffered a TIA and has been on brand name only since     Patient Measurements: Height: 5\' 11"  (180.3 cm) Weight: (!) 347 lb 0.1 oz (157.4 kg) IBW/kg (Calculated) : 75.3  Vital Signs: Temp: 98 F (36.7 C) (02/03 0153) Temp Source: Oral (02/03 0153) BP: 127/60 (02/03 0227) Pulse Rate: 62 (02/03 0227)  Labs: Recent Labs    05/13/19 1759 05/13/19 1930 05/13/19 2133  HGB 7.2*  --   --   HCT 25.3*  --   --   PLT 280  --   --   LABPROT  --   --  61.8*  INR  --   --  7.2*  CREATININE 2.67*  --   --   TROPONINIHS  --  53* 58*    Estimated Creatinine Clearance: 38.8 mL/min (A) (by C-G formula based on SCr of 2.67 mg/dL (H)).   Medical History: Past Medical History:  Diagnosis Date  . Atrial arrhythmia    status post ablation in [place] with complication including damage to the aortic valve  . Atrial fibrillation ([place])   . AV block, 1st degree   . Bradycardia   . Cluster headache ~ 1980   "went away after I started taking one of the heart RXs" (06/29/2017)  . Diastolic CHF, chronic ([place])   . Diverticulosis   . History of gout   . HTN (hypertension)   . Hypertrophic cardiomyopathy ([place])    s/p myoectomy  . Infection   . Internal hemorrhoids   . Morbid obesity ([place])   . Morbid obesity with BMI of 45.0-49.9, adult ([place])   . Pacemaker  -SJM   . Skin cancer    "burned off RUE X 1; cut off nose X 2" (06/29/2017)  . TIA (transient ischemic attack) 2001  . Tubulovillous adenoma     Medications:  No current facility-administered medications on file prior to encounter.   Current Outpatient Medications on File Prior to  Encounter  Medication Sig Dispense Refill  . acetaminophen (TYLENOL) 325 MG tablet Take 2 tablets (650 mg total) by mouth every 8 (eight) hours as needed for mild pain or fever.    . Amino Acids-Protein Hydrolys (FEEDING SUPPLEMENT, PRO-STAT SUGAR FREE 64,) LIQD Take 30 mLs by mouth 2 (two) times daily. 887 mL 0  . Ascorbic Acid (VITAMIN C) 1000 MG tablet Take 2,000 mg by mouth daily.      Marland Kitchen aspirin 81 MG tablet Take 81 mg by mouth daily.    . benzonatate (TESSALON) 100 MG capsule Take 1 capsule (100 mg total) by mouth 2 (two) times daily as needed for cough. 20 capsule 0  . diphenhydramine-acetaminophen (TYLENOL PM) 25-500 MG TABS tablet Take 2 tablets by mouth at bedtime as needed (for sleep).    . Ensure Max Protein (ENSURE MAX PROTEIN) LIQD Take 330 mLs (11 oz total) by mouth daily. 330 mL 12  . ferrous sulfate 325 (65 FE) MG tablet Take 325 mg by mouth daily with breakfast.    . fish oil-omega-3 fatty acids 1000 MG capsule Take 4 g by mouth every morning.     Marland Kitchen HYDROcodone-acetaminophen (NORCO/VICODIN) 5-325 MG tablet Take 1 tablet  by mouth every 8 (eight) hours as needed for moderate pain or severe pain. 24 tablet 0  . JANTOVEN 10 MG tablet Take 0.5 tablets (5 mg total) by mouth daily at 6 PM. Take above dose until follow up with your Coumadin clinic then follow their instructions for dosing. 30 tablet 0  . Magnesium 250 MG TABS Take 500-750 mg by mouth See admin instructions. 750 mg in the morning, 500 mg in the evening    . Multiple Vitamin (MULTIVITAMIN) tablet Take 1 tablet by mouth daily.      Marland Kitchen omeprazole (PRILOSEC) 20 MG capsule Take 1 capsule (20 mg total) by mouth daily. 90 capsule 1  . potassium chloride (KLOR-CON) 10 MEQ tablet Take 2 tablets (20 mEq total) by mouth 2 (two) times daily.    Marland Kitchen torsemide (DEMADEX) 20 MG tablet Take 3 tablets (60 mg total) by mouth 2 (two) times daily. Take an extra tablet as needed 560 tablet 3  . warfarin (COUMADIN) 10 MG tablet Take 10 mg by mouth  daily. Takes 10 mg po QD; extra 5 mg on Sunday and Wednesday    . Wheat Dextrin (BENEFIBER) CHEW Chew 3 tablets by mouth daily.    . [DISCONTINUED] enoxaparin (LOVENOX) 150 MG/ML injection Inject 1 mL (150 mg total) into the skin every 12 (twelve) hours. 7 mL 0  . [DISCONTINUED] fondaparinux (ARIXTRA) 10 MG/0.8ML SOLN injection Inject 0.8 mLs (10 mg total) into the skin daily. 4 mL 1     Assessment: 74 y.o. male admitted with CHF, h/o Afib and MVR, to continue Coumadin .  INR today supratherapeutic   Goal of Therapy:  INR 2.5-3.5 Monitor platelets by anticoagulation protocol: Yes   Plan:  No Coumadin today F/U daily INR  Navreet Bolda, Bronson Curb 05/14/2019,4:25 AM

## 2019-05-14 NOTE — Consult Note (Signed)
   [facility] Inpatient Consult   05/14/2019  Frank [name] Apr 21, 1945 [id number]   Patient is currently active with [facility] for chronic disease management services.  Patient has been engaged by a [facility].  Our community based plan of care has focused on disease management and community resource support.   Medicare record review reveals per History and Physical today which includes but not limited to as follows:  Frank [name] is a 74 y.o. male with history of hypertrophic cardiomyopathy status post myomectomy with history of mechanical mitral valve and aortic valve repair with history of pacemaker placement atrial fibrillation chronic kidney disease who was admitted and discharged on December 2020 for sepsis secondary Streptococcus from the right leg cellulitis antibiotic course was recently finished has been experiencing increasing shortness of breath which is acutely worsened over the last few days and patient states he has gained at least 5 pounds in the last 24 hours.  Denies any chest pain fever chills nausea vomiting abdominal pain or diarrhea.  Patient states he has been compliant with his torsemide.  Plan: Follow up with patient's [facility] RNCM to make aware of admission and for any barriers to care. Also, follow up Inpatient Transition Of Care [TOC] team member to make aware that [facility] following.   Of note, [facility] Care Management services does not replace or interfere with any services that are needed or arranged by inpatient [facility] care management team.  For additional questions or referrals please contact:  [name], RN BSN [facility] Liaison  [phone] business mobile phone Toll free office [phone]  Fax number: [phone] [email or web address]

## 2019-05-14 NOTE — H&P (Addendum)
History and Physical    GERSON FAUTH QIW:[id number] DOB: 11/12/1945 DOA: 05/13/2019  PCP: [name], MD  Patient coming from: Home.  Chief Complaint: Shortness of breath.  HPI: Frank [name] is a 74 y.o. male with history of hypertrophic cardiomyopathy status post myomectomy with history of mechanical mitral valve and aortic valve repair with history of pacemaker placement atrial fibrillation chronic kidney disease who was admitted and discharged on December 2020 for sepsis secondary Streptococcus from the right leg cellulitis antibiotic course was recently finished has been experiencing increasing shortness of breath which is acutely worsened over the last few days and patient states he has gained at least 5 pounds in the last 24 hours.  Denies any chest pain fever chills nausea vomiting abdominal pain or diarrhea.  Patient states he has been compliant with his torsemide.  ED Course: In the ER patient was hypoxic chest x-ray showing congestion on exam patient has significant edema and abdominal distention.  Labs show potassium of 7 creatinine 2.67 increased from 1.8 about 10 days ago.  INR was supratherapeutic at 7.2 hemoglobin 7.2 baseline is around 8.  Patient does not have any obvious bleeding per the patient.  EKG shows paced rhythm COVID-19 was negative.  Patient was given 120 mg IV bolus of Lasix and started on Lasix drip by the cardiologist.  For the hyperkalemia patient was given Lokelma 5 g followed by bicarbonate IV insulin D50.  Review of Systems: As per HPI, rest all negative.   Past Medical History:  Diagnosis Date  . Atrial arrhythmia    status post ablation in [place] with complication including damage to the aortic valve  . Atrial fibrillation ([place])   . AV block, 1st degree   . Bradycardia   . Cluster headache ~ 1980   "went away after I started taking one of the heart RXs" (06/29/2017)  . Diastolic CHF, chronic ([place])   . Diverticulosis   . History of gout     . HTN (hypertension)   . Hypertrophic cardiomyopathy ([place])    s/p myoectomy  . Infection   . Internal hemorrhoids   . Morbid obesity ([place])   . Morbid obesity with BMI of 45.0-49.9, adult ([place])   . Pacemaker  -SJM   . Skin cancer    "burned off RUE X 1; cut off nose X 2" (06/29/2017)  . TIA (transient ischemic attack) 2001  . Tubulovillous adenoma     Past Surgical History:  Procedure Laterality Date  . 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 BIV PACEMAKER INSERTION CRT-P N/A 07/27/2017   Procedure: BIV PACEMAKER INSERTION CRT-P;  Surgeon: [name], MD;  Location: [facility];  Service: Cardiovascular;  Laterality: N/A;  . BIV UPGRADE N/A 05/28/2017   Procedure: BIV [place];  Surgeon: [name], MD;  Location: [facility];  Service: Cardiovascular;  Laterality: N/A;  . CARDIAC CATHETERIZATION     "couple times" (06/29/2017)  . CARDIAC VALVE REPLACEMENT    . COLONOSCOPY    . COLONOSCOPY WITH PROPOFOL N/A 04/13/2016   Procedure: COLONOSCOPY WITH PROPOFOL;  Surgeon: Gatha Mayer, MD;  Location: [facility];  Service: Endoscopy;  Laterality: N/A;  . COLONOSCOPY WITH PROPOFOL N/A 02/28/2019   Procedure: COLONOSCOPY WITH PROPOFOL;  Surgeon: Gatha Mayer, MD;  Location: [facility];  Service: Endoscopy;  Laterality: N/A;  . INSERT / REPLACE / REMOVE PACEMAKER  ~ 2010  [employer]   . 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  . PACEMAKER LEAD REMOVAL N/A 06/29/2017   Procedure: [id number]  REMOVAL AND Four LEAD REMOVAL;  Surgeon: Evans Lance, MD;  Location: [facility];  Service: Cardiovascular;  Laterality: N/A;  . PACEMAKER REMOVAL  06/29/2017   GERNERATOR  REMOVAL AND Four LEAD REMOVAL; put in external pacemaker (06/29/2017)  . septal myectomy  07/22/1997  . SKIN CANCER EXCISION    . TEE WITHOUT CARDIOVERSION N/A 03/21/2019   Procedure: TRANSESOPHAGEAL ECHOCARDIOGRAM  (TEE);  Surgeon: Jerline Pain, MD;  Location: [facility] END[facility]COPY;  Service: Cardiovascular;  Laterality: N/A;     reports that he quit smoking about 42 years ago. His smoking use included cigarettes, pipe, and cigars. He quit after 10.00 years of use. He has never used smokeless tobacco. He reports current alcohol use of about 2.0 standard drinks of alcohol per week. He reports that he does not use drugs.  Allergies  Allergen Reactions  . Warfarin And Related Other (See Comments)    COUMADIN or JANTOVEN BRAND ONLY** Per pt, was switched to generic in 2001 and had significantly decreased absorption > INR subtherapeutic> pt suffered a TIA and has been on brand name only since     Family History  Problem Relation Age of Onset  . Heart disease Sister   . Uterine cancer Sister   . Obesity Sister        500+ lbs  . Heart disease Brother   . Heart disease Sister   . Heart disease Father   . Colon cancer Father 81  . Crohn's disease Paternal Grandmother     Prior to Admission medications   Medication Sig Start Date End Date Taking? Authorizing Provider  acetaminophen (TYLENOL) 325 MG tablet Take 2 tablets (650 mg total) by mouth every 8 (eight) hours as needed for mild pain or fever. 03/31/19   Hongalgi, Lenis Dickinson, MD  Amino Acids-Protein Hydrolys (FEEDING SUPPLEMENT, PRO-STAT SUGAR FREE 64,) LIQD Take 30 mLs by mouth 2 (two) times daily. 03/31/19   Hongalgi, Lenis Dickinson, MD  Ascorbic Acid (VITAMIN C) 1000 MG tablet Take 2,000 mg by mouth daily.      [provider]  aspirin 81 MG tablet Take 81 mg by mouth daily.    [provider]  benzonatate (TESSALON) 100 MG capsule Take 1 capsule (100 mg total) by mouth 2 (two) times daily as needed for cough. 04/22/19   [name], MD  diphenhydramine-acetaminophen (TYLENOL PM) 25-500 MG TABS tablet Take 2 tablets by mouth at bedtime as needed (for sleep).    [provider]  Ensure Max Protein (ENSURE MAX PROTEIN) LIQD Take 330  mLs (11 oz total) by mouth daily. 04/01/19   Hongalgi, Lenis Dickinson, MD  ferrous sulfate 325 (65 FE) MG tablet Take 325 mg by mouth daily with breakfast.    [provider]  fish oil-omega-3 fatty acids 1000 MG capsule Take 4 g by mouth every morning.     [provider]  HYDROcodone-acetaminophen (NORCO/VICODIN) 5-325 MG tablet Take 1 tablet by mouth every 8 (eight) hours as needed for moderate pain or severe pain. 04/09/19   [name], MD  JANTOVEN 10 MG tablet Take 0.5 tablets (5 mg total) by mouth daily at 6 PM. Take above dose until follow up with your Coumadin clinic then follow their instructions for dosing. 03/31/19   Hongalgi, Lenis Dickinson, MD  Magnesium 250 MG TABS Take  500-750 mg by mouth See admin instructions. 750 mg in the morning, 500 mg in the evening    [provider]  Multiple Vitamin (MULTIVITAMIN) tablet Take 1 tablet by mouth daily.      [provider]  omeprazole (PRILOSEC) 20 MG capsule Take 1 capsule (20 mg total) by mouth daily. 04/08/19   [name], MD  potassium chloride (KLOR-CON) 10 MEQ tablet Take 2 tablets (20 mEq total) by mouth 2 (two) times daily. 04/21/19   Baldwin Jamaica, PA-C  torsemide (DEMADEX) 20 MG tablet Take 3 tablets (60 mg total) by mouth 2 (two) times daily. Take an extra tablet as needed 04/21/19   [name], MD  warfarin (COUMADIN) 10 MG tablet Take 10 mg by mouth daily. Takes 10 mg po QD; extra 5 mg on Sunday and Wednesday    [name], MD  Wheat Dextrin (BENEFIBER) CHEW Chew 3 tablets by mouth daily.    [provider]  enoxaparin (LOVENOX) 150 MG/ML injection Inject 1 mL (150 mg total) into the skin every 12 (twelve) hours. 02/20/19 03/16/19  [name], MD  fondaparinux (ARIXTRA) 10 MG/0.8ML SOLN injection Inject 0.8 mLs (10 mg total) into the skin daily. 02/20/19 03/16/19  [name], MD    Physical Exam: Constitutional: Moderately built and nourished. Vitals:   05/14/19  0127 05/14/19 0153 05/14/19 0157 05/14/19 0227  BP: (!) 128/46 (!) 128/46 133/69 127/60  Pulse: 63 63 (!) 59 62  Resp: 19 (!) 24 (!) 21 14  Temp:  98 F (36.7 C)    TempSrc:  Oral    SpO2: 95% 93% 90% 93%  Weight:      Height:       Eyes: Anicteric no pallor. ENMT: No discharge from the ears eyes nose or mouth. Neck: No mass felt.  No neck rigidity. Respiratory: No rhonchi or crepitations. Cardiovascular: S1-S2 heard. Abdomen: Mildly distended nontender bowel sounds present. Musculoskeletal: Bilateral lower extremity edema present. Skin: Chronic skin changes in both lower extremities.  Dressing on the right lower extremity. Neurologic: Alert awake oriented time place and person.  Moves all extremities. Psychiatric: Appears normal per normal affect.   Labs on Admission: I have personally reviewed following labs and imaging studies  CBC: Recent Labs  Lab 05/13/19 1759  WBC 8.2  HGB 7.2*  HCT 25.3*  MCV 100.0  PLT 536   Basic Metabolic Panel: Recent Labs  Lab 05/13/19 1759  NA 131*  K 7.0*  CL 89*  CO2 28  GLUCOSE 105*  BUN 87*  CREATININE 2.67*  CALCIUM 9.0   GFR: Estimated Creatinine Clearance: 38.8 mL/min (A) (by C-G formula based on SCr of 2.67 mg/dL (H)). Liver Function Tests: No results for input(s): AST, ALT, ALKPHOS, BILITOT, PROT, ALBUMIN in the last 168 hours. No results for input(s): LIPASE, AMYLASE in the last 168 hours. No results for input(s): AMMONIA in the last 168 hours. Coagulation Profile: Recent Labs  Lab 05/13/19 2133  INR 7.2*   Cardiac Enzymes: No results for input(s): CKTOTAL, CKMB, CKMBINDEX, TROPONINI in the last 168 hours. BNP (last 3 results) No results for input(s): PROBNP in the last 8760 hours. HbA1C: No results for input(s): HGBA1C in the last 72 hours. CBG: No results for input(s): GLUCAP in the last 168 hours. Lipid Profile: No results for input(s): CHOL, HDL, LDLCALC, TRIG, CHOLHDL, LDLDIRECT in the last 72  hours. Thyroid Function Tests: No results for input(s): TSH, T4TOTAL, FREET4, T3FREE, THYROIDAB in  the last 72 hours. Anemia Panel: No results for input(s): VITAMINB12, FOLATE, FERRITIN, TIBC, IRON, RETICCTPCT in the last 72 hours. Urine analysis:    Component Value Date/Time   COLORURINE YELLOW 03/17/2019 0054   APPEARANCEUR CLOUDY (A) 03/17/2019 0054   LABSPEC 1.006 03/17/2019 0054   PHURINE 6.0 03/17/2019 0054   GLUCOSEU NEGATIVE 03/17/2019 0054   HGBUR SMALL (A) 03/17/2019 0054   BILIRUBINUR NEGATIVE 03/17/2019 0054   KETONESUR NEGATIVE 03/17/2019 0054   PROTEINUR 30 (A) 03/17/2019 0054   NITRITE NEGATIVE 03/17/2019 0054   LEUKOCYTESUR LARGE (A) 03/17/2019 0054   Sepsis Labs: @LABRCNTIP (procalcitonin:4,lacticidven:4) ) Recent Results (from the past 240 hour(s))  Respiratory Panel by RT PCR (Flu A&B, Covid) - Nasopharyngeal Swab     Status: None   Collection Time: 05/13/19  8:19 PM   Specimen: Nasopharyngeal Swab  Result Value Ref Range Status   SARS Coronavirus 2 by RT PCR NEGATIVE NEGATIVE Final    Comment: (NOTE) SARS-CoV-2 target nucleic acids are NOT DETECTED. The SARS-CoV-2 RNA is generally detectable in upper respiratoy specimens during the acute phase of infection. The lowest concentration of SARS-CoV-2 viral copies this assay can detect is 131 copies/mL. A negative result does not preclude SARS-Cov-2 infection and should not be used as the sole basis for treatment or other patient management decisions. A negative result may occur with  improper specimen collection/handling, submission of specimen other than nasopharyngeal swab, presence of viral mutation(s) within the areas targeted by this assay, and inadequate number of viral copies (<131 copies/mL). A negative result must be combined with clinical observations, patient history, and epidemiological information. The expected result is Negative. Fact Sheet for Patients:   PinkCheek.be Fact Sheet for Healthcare Providers:  GravelBags.it This test is not yet ap proved or cleared by the [place] FDA and  has been authorized for detection and/or diagnosis of SARS-CoV-2 by FDA under an Emergency Use Authorization (EUA). This EUA will remain  in effect (meaning this test can be used) for the duration of the COVID-19 declaration under Section 564(b)(1) of the Act, 21 U.S.C. section 360bbb-3(b)(1), unless the authorization is terminated or revoked sooner.    Influenza A by PCR NEGATIVE NEGATIVE Final   Influenza B by PCR NEGATIVE NEGATIVE Final    Comment: (NOTE) The Xpert Xpress SARS-CoV-2/FLU/RSV assay is intended as an aid in  the diagnosis of influenza from Nasopharyngeal swab specimens and  should not be used as a sole basis for treatment. Nasal washings and  aspirates are unacceptable for Xpert Xpress SARS-CoV-2/FLU/RSV  testing. Fact Sheet for Patients: PinkCheek.be Fact Sheet for Healthcare Providers: GravelBags.it This test is not yet approved or cleared by the [place] FDA and  has been authorized for detection and/or diagnosis of SARS-CoV-2 by  FDA under an Emergency Use Authorization (EUA). This EUA will remain  in effect (meaning this test can be used) for the duration of the  Covid-19 declaration under Section 564(b)(1) of the Act, 21  U.S.C. section 360bbb-3(b)(1), unless the authorization is  terminated or revoked. Performed at [facility], [place] [address] [place], [place] [address]      Radiological Exams on Admission: DG Chest 2 View  Result Date: 05/13/2019 CLINICAL DATA:  Shortness of breath EXAM: CHEST - 2 VIEW COMPARISON:  March 16, 2019 FINDINGS: There is a dual lead AICD. Multiple sternal wires are present. Mild, chronic appearing increased interstitial lung markings are seen. Very mild atelectasis  is noted within the bilateral lung bases. There is no  evidence of a pleural effusion or pneumothorax. The cardiac silhouette is markedly enlarged. Multilevel degenerative changes seen throughout the thoracic spine. IMPRESSION: 1. Marked cardiomegaly. 2. Mild, chronic appearing increased interstitial lung markings. A mild superimposed component of interstitial edema cannot be excluded. 3. Mild bibasilar atelectasis. Electronically Signed   By: Virgina Norfolk M.D.   On: 05/13/2019 18:52    EKG: Independently reviewed.  Paced rhythm.  Assessment/Plan Principal Problem:   Acute CHF (congestive heart failure) (HCC) Active Problems:   Essential hypertension   S/P mitral valve replacement-Mechanical   NICM (nonischemic cardiomyopathy) (HCC)   Pulmonary hypertension (HCC)   ARF (acute renal failure) (HCC)   Hyperkalemia    1. Acute on chronic diastolic CHF last EF measured and December 2020 was 55 to 60%.  Appreciate cardiology input patient is presently on Lasix drip.  Closely follow intake output metabolic panel.  Daily weights. 2. Acute on chronic kidney disease stage IV with hyperkalemia likely secondary to cardiorenal syndrome.  Patient is on Lasix drip.  Patient did receive Lokelma, bicarbonate D50 and IV insulin for the hyperkalemia.  Follow metabolic panel closely along with intake output. 3. Coagulopathy secondary to warfarin.  Being monitored by pharmacy. 4. Mechanical mitral valve replacement and aortic valve repair presently on Coumadin.  Followed by pharmacy for Coumadin dosing. 5. Anemia likely from renal disease further worsening of hemoglobin noted.  Follow CBC closely. 6. Suspected sleep apnea obesity hypoventilation for which cardiology is requested possible need for BiPAP at bedtime.  Will order that. 7. Recent admission for Streptococcus bacteremia secondary right lower extremity cellulitis.  Has improved.  Will get wound team consult.  Given the acute respiratory failure with  hypoxia will need close monitoring for any further deterioration in inpatient status.  Addendum -patient's morning labs show potassium of 6.4 with INR further worsening from 7.2-7.6 hemoglobin is dropped to 6.5.  Discussed with pharmacy at this time who recommended to give Lokelma 10 g and vitamin K 2.5 mg p.o. and I have also ordered a type and screen and repeat CBC in another hour.   DVT prophylaxis: Warfarin presently coagulopathy. Code Status: Full code. Family Communication: Discussed with patient. Disposition Plan: To be determined. Consults called: Cardiology. Admission status: Inpatient.   Rise Patience MD [facility] Pager [phone].  If 7PM-7AM, please contact night-coverage [email or web address] Password [email or web address]  05/14/2019, 4:18 AM

## 2019-05-15 ENCOUNTER — Inpatient Hospital Stay (HOSPITAL_COMMUNITY): Payer: Medicare Other

## 2019-05-15 ENCOUNTER — Telehealth: Payer: Medicare Other | Admitting: Internal Medicine

## 2019-05-15 DIAGNOSIS — I350 Nonrheumatic aortic (valve) stenosis: Secondary | ICD-10-CM

## 2019-05-15 DIAGNOSIS — E611 Iron deficiency: Secondary | ICD-10-CM

## 2019-05-15 DIAGNOSIS — R791 Abnormal coagulation profile: Secondary | ICD-10-CM

## 2019-05-15 DIAGNOSIS — I361 Nonrheumatic tricuspid (valve) insufficiency: Secondary | ICD-10-CM

## 2019-05-15 LAB — MAGNESIUM: Magnesium: 3.1 mg/dL — ABNORMAL HIGH (ref 1.7–2.4)

## 2019-05-15 LAB — RENAL FUNCTION PANEL
Albumin: 2.5 g/dL — ABNORMAL LOW (ref 3.5–5.0)
Albumin: 2.5 g/dL — ABNORMAL LOW (ref 3.5–5.0)
Anion gap: 11 (ref 5–15)
Anion gap: 13 (ref 5–15)
BUN: 98 mg/dL — ABNORMAL HIGH (ref 8–23)
BUN: 98 mg/dL — ABNORMAL HIGH (ref 8–23)
CO2: 29 mmol/L (ref 22–32)
CO2: 28 mmol/L (ref 22–32)
Calcium: 8.7 mg/dL — ABNORMAL LOW (ref 8.9–10.3)
Calcium: 8.6 mg/dL — ABNORMAL LOW (ref 8.9–10.3)
Chloride: 91 mmol/L — ABNORMAL LOW (ref 98–111)
Chloride: 89 mmol/L — ABNORMAL LOW (ref 98–111)
Creatinine, Ser: 2.91 mg/dL — ABNORMAL HIGH (ref 0.61–1.24)
Creatinine, Ser: 2.89 mg/dL — ABNORMAL HIGH (ref 0.61–1.24)
GFR calc Af Amer: 24 mL/min — ABNORMAL LOW (ref >60)
GFR calc Af Amer: 24 mL/min — ABNORMAL LOW (ref >60)
GFR calc non Af Amer: 21 mL/min — ABNORMAL LOW (ref >60)
GFR calc non Af Amer: 21 mL/min — ABNORMAL LOW (ref >60)
Glucose, Bld: 125 mg/dL — ABNORMAL HIGH (ref 70–99)
Glucose, Bld: 121 mg/dL — ABNORMAL HIGH (ref 70–99)
Phosphorus: 5.7 mg/dL — ABNORMAL HIGH (ref 2.5–4.6)
Phosphorus: 5.3 mg/dL — ABNORMAL HIGH (ref 2.5–4.6)
Potassium: 5.5 mmol/L — ABNORMAL HIGH (ref 3.5–5.1)
Potassium: 6 mmol/L — ABNORMAL HIGH (ref 3.5–5.1)
Sodium: 131 mmol/L — ABNORMAL LOW (ref 135–145)
Sodium: 130 mmol/L — ABNORMAL LOW (ref 135–145)

## 2019-05-15 LAB — CBC
HCT: 23.6 % — ABNORMAL LOW (ref 39.0–52.0)
Hemoglobin: 7.2 g/dL — ABNORMAL LOW (ref 13.0–17.0)
MCH: 28.8 pg (ref 26.0–34.0)
MCHC: 30.5 g/dL (ref 30.0–36.0)
MCV: 94.4 fL (ref 80.0–100.0)
Platelets: 256 10*3/uL (ref 150–400)
RBC: 2.5 MIL/uL — ABNORMAL LOW (ref 4.22–5.81)
RDW: 18.4 % — ABNORMAL HIGH (ref 11.5–15.5)
WBC: 8.1 10*3/uL (ref 4.0–10.5)
nRBC: 0 % (ref 0.0–0.2)

## 2019-05-15 LAB — HEMOGLOBIN AND HEMATOCRIT, BLOOD
HCT: 23.7 % — ABNORMAL LOW (ref 39.0–52.0)
HCT: 24.2 % — ABNORMAL LOW (ref 39.0–52.0)
Hemoglobin: 7.2 g/dL — ABNORMAL LOW (ref 13.0–17.0)
Hemoglobin: 7.4 g/dL — ABNORMAL LOW (ref 13.0–17.0)

## 2019-05-15 LAB — OCCULT BLOOD X 1 CARD TO LAB, STOOL: Fecal Occult Bld: POSITIVE — AB

## 2019-05-15 LAB — LACTATE DEHYDROGENASE: LDH: 300 U/L — ABNORMAL HIGH (ref 98–192)

## 2019-05-15 LAB — ECHOCARDIOGRAM COMPLETE
Height: 71 in
Weight: 5488 oz

## 2019-05-15 LAB — PROTIME-INR
INR: 6.4 (ref 0.8–1.2)
INR: 6.6 (ref 0.8–1.2)
Prothrombin Time: 56.8 seconds — ABNORMAL HIGH (ref 11.4–15.2)
Prothrombin Time: 58.1 seconds — ABNORMAL HIGH (ref 11.4–15.2)

## 2019-05-15 MED ORDER — ACETAZOLAMIDE 250 MG PO TABS
250.0000 mg | ORAL_TABLET | Freq: Two times a day (BID) | ORAL | Status: DC
  Administered 2019-05-15 – 2019-05-26 (×22): 250 mg via ORAL
  Filled 2019-05-15 (×24): qty 1

## 2019-05-15 MED ORDER — PHYTONADIONE 5 MG PO TABS
2.5000 mg | ORAL_TABLET | Freq: Once | ORAL | Status: AC
  Administered 2019-05-15: 2.5 mg via ORAL
  Filled 2019-05-15: qty 1

## 2019-05-15 MED ORDER — SODIUM CHLORIDE 0.9 % IV SOLN
510.0000 mg | Freq: Once | INTRAVENOUS | Status: AC
  Administered 2019-05-15: 510 mg via INTRAVENOUS
  Filled 2019-05-15: qty 17

## 2019-05-15 MED ORDER — LIP MEDEX EX OINT
TOPICAL_OINTMENT | CUTANEOUS | Status: DC | PRN
  Filled 2019-05-15: qty 7

## 2019-05-15 MED ORDER — VITAMIN K1 10 MG/ML IJ SOLN
5.0000 mg | Freq: Once | INTRAMUSCULAR | Status: AC
  Administered 2019-05-15: 5 mg via SUBCUTANEOUS
  Filled 2019-05-15: qty 0.5

## 2019-05-15 MED ORDER — PANTOPRAZOLE SODIUM 40 MG IV SOLR
40.0000 mg | Freq: Two times a day (BID) | INTRAVENOUS | Status: DC
  Administered 2019-05-15: 21:00:00 40 mg via INTRAVENOUS
  Filled 2019-05-15: qty 40

## 2019-05-15 NOTE — Progress Notes (Signed)
Critical Result - INR = 6.6. Dr Cyndia Skeeters notified. Order received for Vit K Sq.

## 2019-05-15 NOTE — Progress Notes (Signed)
PROGRESS NOTE  Frank [name] YDX:[id number] DOB: 08/06/1945   PCP: [name], MD  Patient is from: Home.  Lives with his wife.  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 Fib, lymphedema, CKD-4, debility, morbid obesity, recurrent hospitalization for sepsis secondary to Streptococcus from RLE cellulitis presenting with progressive dyspnea and weight gain.   In ED, hemodynamically stable except for brief tachycardia to 124.  Saturation 98% on room air. CXR consistent with CHF.  K7.0. Cr 2.67 (baseline~1.8).  INR 7.2.  Hgb 7.2 (baseline about 8.0).  EKG revealed paced rhythm.  COVID-19 negative.  Received IV Lasix 120 mg once and was started on Lasix drip by cardiologist.  Also received bicarb, IV insulin and D50, and admitted for CHF exacerbation, AKI, hyperkalemia, supratherapeutic INR and anemia.  Patient received additional dose of Lokelma 10 g and oral vitamin K 2.5 mg.   Subjective: No major events overnight or this morning.  Reports feeling well this morning.  No complaints.  Denies chest pain, dyspnea, cough, GI or UTI symptoms.  He says he had a bowel movement earlier this morning.  He says his stool is always dark due to iron.  Objective: Vitals:   05/14/19 2132 05/14/19 2147 05/15/19 0130 05/15/19 0454  BP:  (!) 121/57 112/85 131/69  Pulse: 64 64  68  Resp: 19 17 19 16   Temp:  98 F (36.7 C) 97.9 F (36.6 C) 98 F (36.7 C)  TempSrc:  Oral Oral Oral  SpO2: 96% 97% 96% 91%  Weight:    (!) 155.6 kg  Height:        Intake/Output Summary (Last 24 hours) at 05/15/2019 1206 Last data filed at 05/15/2019 7209 Gross per 24 hour  Intake 1052.88 ml  Output 750 ml  Net 302.88 ml   Filed Weights   05/14/19 0027 05/14/19 0654 05/15/19 0454  Weight: (!) 157.4 kg (!) 157.4 kg (!) 155.6 kg    Examination:  GENERAL: No acute distress.  Appears well.  HEENT: MMM.  Vision and  hearing grossly intact.  NECK: Supple.  No apparent JVD but difficult exam due to body habitus.Marland Kitchen  RESP:  No IWOB. Good air movement bilaterally. CVS:  RRR.  2/6 SEM over RUSB and LUSB.  Heart sounds normal.  ABD/GI/GU: Bowel sounds present. Soft. Non tender.  MSK/EXT: Unna boot over bilateral lower extremities SKIN: Unna boot over bilateral lower extremities. NEURO: Awake, alert and oriented appropriately.  No apparent focal neuro deficit. PSYCH: Calm. Normal affect.  In good spirits.  Procedures:  None  Assessment & Plan: Acute on chronic diastolic CHF: Echo in 47/09 with EF of 55 to 60%, moderate LVH, moderate LAE and severe RAE, moderate to severe TVR.  Had significant fluid overload with massive lymphedema.  Presented with dyspnea and weight gain.  CXR concerning for CHF.  Feels better this morning.  I and O incomplete.  Creatinine slightly up. -On IV Lasix drip per cardiology -Monitor fluid status, renal function and electrolytes -Sodium and fluid restrictions.  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  -Hyperkalemia improving-expect further improvement with IV diuretics as above. -Continue monitoring.  Acute on chronic blood loss anemia in in the setting of supratherapeutic INR: Colo on 03/10/2019 revealed diverticulosis and internal and external hemorrhoid thought to be the source of his bleeding at that time. -Hgb ~8.0 (baseline)> 7.2 (admit)> 6.5>2u>7.2 -Additional p.o. vitamin K 2.5 mg once. -  Monitor H&H every 8 hours -Change p.o. Protonix to IV every 12 hours. -Consult [facility].  Supratherapeutic INR: patient with supratherapeutic INR on warfarin likely due to renal failure. -P.o. vitamin K 2.5 mg once. -Warfarin on hold. -Recheck INR in the afternoon  Hyponatremia: Likely due to renal failure.  Relatively stable. -Continue monitoring  Mechanical MVR/AVR: INR remains supratherapeutic. -Hold warfarin -Repeat p.o. vitamin K 2.5 mg once  At risk  for sleep apnea/OHS: Body mass index is 47.84 kg/m. -Nightly CPAP-refusing.  Recent hospitalization for group G Streptococcus bacteremia from RLE cellulitis in patient with lymphedema -Completed antibiotic course with penicillin G followed by oral Zyvox for a total of 14 days through 12/22. -Evaluated by ID, Dr. Tommy Medal on 1/11 and prescribed Augmentin for 2 more weeks with refill. -Blood cultures negative x2 this admission. -Continue oral amoxicillin and Florastor.  Paroxysmal atrial fibrillation: NSR.  Not on rate or rhythm control. -Warfarin on hold in the setting of supratherapeutic INR.   Chronic pain: Improved. -Continue scheduled Tylenol with as needed oxycodone.   Generalized weakness/debility -PT/OT-recommend CIR.  Lymphedema/possible right LE cellulitis -Diuretics and antibiotics as above -Appreciate WOC  Morbid obesity: Body mass index is 47.84 kg/m. -Encourage lifestyle change to lose weight        DVT prophylaxis: None.  INR supratherapeutic. Code Status: Full code-confirmed with patient. Family Communication: Updated patient's wife at bedside on 2/4.   Discharge barrier: Multiple medical issues as above Patient is from: Home Final disposition: Likely CIR once medically stable.  Consultants: Cardiology   Microbiology summarized: [id number] and influenza PCR negative. Blood cultures pending.  Sch Meds:  Scheduled Meds: . sodium chloride   Intravenous Once  . acetaminophen  1,000 mg Oral Q8H  . amoxicillin  500 mg Oral Q8H  . ferrous sulfate  325 mg Oral Q breakfast  . magnesium oxide  400 mg Oral QPM  . magnesium oxide  600 mg Oral Daily  . mouth rinse  15 mL Mouth Rinse BID  . pantoprazole  40 mg Oral Daily  . Warfarin - Pharmacist Dosing Inpatient   Does not apply q1800   Continuous Infusions: . furosemide (LASIX) infusion 10 mg/hr (05/15/19 0623)   PRN Meds:.lip balm, oxyCODONE  Antimicrobials: Anti-infectives (From admission, onward)    Start     Dose/Rate Route Frequency Ordered Stop   05/14/19 1700  amoxicillin (AMOXIL) capsule 500 mg     500 mg Oral Every 8 hours 05/14/19 1620         I have personally reviewed the following labs and images: CBC: Recent Labs  Lab 05/13/19 1759 05/14/19 0435 05/14/19 0819 05/15/19 0559 05/15/19 1109  WBC 8.2 8.2 7.7 8.1  --   NEUTROABS  --  5.5  --   --   --   HGB 7.2* 6.5* 6.4* 7.2* 7.2*  HCT 25.3* 21.4* 21.9* 23.6* 23.7*  MCV 100.0 94.7 95.6 94.4  --   PLT 280 266 257 256  --    BMP &GFR Recent Labs  Lab 05/13/19 1759 05/14/19 0435 05/14/19 1210 05/15/19 0559  NA 131* 132* 129* 131*  K 7.0* 6.4* 6.0* 5.5*  CL 89* 91* 90* 91*  CO2 28 29 29 29   GLUCOSE 105* 94 100* 125*  BUN 87* 89* 93* 98*  CREATININE 2.67* 2.77* 2.76* 2.91*  CALCIUM 9.0 8.8* 8.6* 8.7*  MG  --   --   --  3.1*  PHOS  --   --  4.6 5.7*   Estimated  Creatinine Clearance: 35.4 mL/min (A) (by C-G formula based on SCr of 2.91 mg/dL (H)). Liver & Pancreas: Recent Labs  Lab 05/14/19 0435 05/14/19 1210 05/15/19 0559  AST 40  --   --   ALT 15  --   --   ALKPHOS 140*  --   --   BILITOT 1.4*  --   --   PROT 7.2  --   --   ALBUMIN 2.5* 2.4* 2.5*   No results for input(s): LIPASE, AMYLASE in the last 168 hours. No results for input(s): AMMONIA in the last 168 hours. Diabetic: No results for input(s): HGBA1C in the last 72 hours. Recent Labs  Lab 05/14/19 1128  GLUCAP 96   Cardiac Enzymes: No results for input(s): CKTOTAL, CKMB, CKMBINDEX, TROPONINI in the last 168 hours. No results for input(s): PROBNP in the last 8760 hours. Coagulation Profile: Recent Labs  Lab 05/13/19 2133 05/14/19 0435 05/14/19 1428 05/15/19 0559  INR 7.2* 7.6* 6.6* 6.4*   Thyroid Function Tests: Recent Labs    05/14/19 0435  TSH 4.695*   Lipid Profile: No results for input(s): CHOL, HDL, LDLCALC, TRIG, CHOLHDL, LDLDIRECT in the last 72 hours. Anemia Panel: Recent Labs    05/14/19 1730 05/14/19 1733    VITAMINB12  --  2,244*  FOLATE  --  11.4  FERRITIN  --  40  TIBC  --  435  IRON  --  67  RETICCTPCT 4.3*  --    Urine analysis:    Component Value Date/Time   COLORURINE YELLOW 03/17/2019 0054   APPEARANCEUR CLOUDY (A) 03/17/2019 0054   LABSPEC 1.006 03/17/2019 0054   PHURINE 6.0 03/17/2019 0054   GLUCOSEU NEGATIVE 03/17/2019 0054   HGBUR SMALL (A) 03/17/2019 0054   BILIRUBINUR NEGATIVE 03/17/2019 0054   [place] 03/17/2019 0054   PROTEINUR 30 (A) 03/17/2019 0054   NITRITE NEGATIVE 03/17/2019 0054   LEUKOCYTESUR LARGE (A) 03/17/2019 0054   Sepsis Labs: Invalid input(s): PROCALCITONIN, [place]  Microbiology: Recent Results (from the past 240 hour(s))  Respiratory Panel by RT PCR (Flu A&B, Covid) - Nasopharyngeal Swab     Status: None   Collection Time: 05/13/19  8:19 PM   Specimen: Nasopharyngeal Swab  Result Value Ref Range Status   SARS Coronavirus 2 by RT PCR NEGATIVE NEGATIVE Final    Comment: (NOTE) SARS-CoV-2 target nucleic acids are NOT DETECTED. The SARS-CoV-2 RNA is generally detectable in upper respiratoy specimens during the acute phase of infection. The lowest concentration of SARS-CoV-2 viral copies this assay can detect is 131 copies/mL. A negative result does not preclude SARS-Cov-2 infection and should not be used as the sole basis for treatment or other patient management decisions. A negative result may occur with  improper specimen collection/handling, submission of specimen other than nasopharyngeal swab, presence of viral mutation(s) within the areas targeted by this assay, and inadequate number of viral copies (<131 copies/mL). A negative result must be combined with clinical observations, patient history, and epidemiological information. The expected result is Negative. Fact Sheet for Patients:  PinkCheek.be Fact Sheet for Healthcare Providers:  GravelBags.it This test is  not yet ap proved or cleared by the [place] FDA and  has been authorized for detection and/or diagnosis of SARS-CoV-2 by FDA under an Emergency Use Authorization (EUA). This EUA will remain  in effect (meaning this test can be used) for the duration of the COVID-19 declaration under Section 564(b)(1) of the Act, 21 U.S.C. section 360bbb-3(b)(1), unless the authorization is terminated or  revoked sooner.    Influenza A by PCR NEGATIVE NEGATIVE Final   Influenza B by PCR NEGATIVE NEGATIVE Final    Comment: (NOTE) The Xpert Xpress SARS-CoV-2/FLU/RSV assay is intended as an aid in  the diagnosis of influenza from Nasopharyngeal swab specimens and  should not be used as a sole basis for treatment. Nasal washings and  aspirates are unacceptable for Xpert Xpress SARS-CoV-2/FLU/RSV  testing. Fact Sheet for Patients: PinkCheek.be Fact Sheet for Healthcare Providers: GravelBags.it This test is not yet approved or cleared by the [place] FDA and  has been authorized for detection and/or diagnosis of SARS-CoV-2 by  FDA under an Emergency Use Authorization (EUA). This EUA will remain  in effect (meaning this test can be used) for the duration of the  Covid-19 declaration under Section 564(b)(1) of the Act, 21  U.S.C. section 360bbb-3(b)(1), unless the authorization is  terminated or revoked. Performed at [facility], [place] [address] [place], [place] [address]   Culture, blood (routine x 2)     Status: None (Preliminary result)   Collection Time: 05/14/19  2:28 PM   Specimen: BLOOD RIGHT HAND  Result Value Ref Range Status   Specimen Description BLOOD RIGHT HAND  Final   Special Requests   Final    BOTTLES DRAWN AEROBIC ONLY Blood Culture adequate volume   Culture   Final    NO GROWTH < 24 HOURS Performed at [facility], [place] [address] [place], [place] [address]    Report Status PENDING  Incomplete  Culture,  blood (routine x 2)     Status: None (Preliminary result)   Collection Time: 05/14/19  2:34 PM   Specimen: BLOOD LEFT HAND  Result Value Ref Range Status   Specimen Description BLOOD LEFT HAND  Final   Special Requests   Final    BOTTLES DRAWN AEROBIC ONLY Blood Culture adequate volume   Culture   Final    NO GROWTH < 24 HOURS Performed at [facility], [place] [address], [place], [place] [address]    Report Status PENDING  Incomplete    Radiology Studies: No results found.  Maura Braaten T. [place]  If 7PM-7AM, please contact night-coverage [email or web address] Password [facility] 05/15/2019, 12:06 PM

## 2019-05-15 NOTE — Progress Notes (Addendum)
Progress Note  Patient Name: Frank [name] Date of Encounter: 05/15/2019  Primary Cardiologist: [name], MD   Subjective   Pt feeling much better today. Denies SOB, chest pain. Had a 7 beat run of VT early this AM.   Inpatient Medications    Scheduled Meds: . sodium chloride   Intravenous Once  . acetaminophen  1,000 mg Oral Q8H  . amoxicillin  500 mg Oral Q8H  . ferrous sulfate  325 mg Oral Q breakfast  . magnesium oxide  400 mg Oral QPM  . magnesium oxide  600 mg Oral Daily  . mouth rinse  15 mL Mouth Rinse BID  . pantoprazole  40 mg Oral Daily  . phytonadione  2.5 mg Oral Once  . Warfarin - Pharmacist Dosing Inpatient   Does not apply q1800   Continuous Infusions: . furosemide (LASIX) infusion 10 mg/hr (05/15/19 0623)   PRN Meds: oxyCODONE   Vital Signs    Vitals:   05/14/19 2132 05/14/19 2147 05/15/19 0130 05/15/19 0454  BP:  (!) 121/57 112/85 131/69  Pulse: 64 64  68  Resp: 19 17 19 16   Temp:  98 F (36.7 C) 97.9 F (36.6 C) 98 F (36.7 C)  TempSrc:  Oral Oral Oral  SpO2: 96% 97% 96% 91%  Weight:    (!) 155.6 kg  Height:        Intake/Output Summary (Last 24 hours) at 05/15/2019 0755 Last data filed at 05/15/2019 5284 Gross per 24 hour  Intake 1292.88 ml  Output -  Net 1292.88 ml   Filed Weights   05/14/19 0027 05/14/19 0654 05/15/19 0454  Weight: (!) 157.4 kg (!) 157.4 kg (!) 155.6 kg    Physical Exam   General: Obese, NAD Neck: Negative for carotid bruits. No JVD Lungs:Clear to ausculation bilaterally. No wheezes, rales, or rhonchi. Breathing is unlabored. Cardiovascular: RRR with S1 S2. + murmur Abdomen: Soft, non-tender, non-distended. No obvious abdominal masses. Extremities: Bilateral LE edema with evidence of cellulitis>>wraps. DP pulses 1+ bilaterally Neuro: Alert and oriented. No focal deficits. No facial asymmetry. MAE spontaneously. Psych: Responds to questions appropriately with normal affect.    Labs    Chemistry  Recent Labs  Lab 05/14/19 0435 05/14/19 1210 05/15/19 0559  NA 132* 129* 131*  K 6.4* 6.0* 5.5*  CL 91* 90* 91*  CO2 29 29 29   GLUCOSE 94 100* 125*  BUN 89* 93* 98*  CREATININE 2.77* 2.76* 2.91*  CALCIUM 8.8* 8.6* 8.7*  PROT 7.2  --   --   ALBUMIN 2.5* 2.4* 2.5*  AST 40  --   --   ALT 15  --   --   ALKPHOS 140*  --   --   BILITOT 1.4*  --   --   GFRNONAA 22* 22* 21*  GFRAA 25* 26* 24*  ANIONGAP 12 10 11      Hematology Recent Labs  Lab 05/14/19 0435 05/14/19 0435 05/14/19 0819 05/14/19 1730 05/15/19 0559  WBC 8.2  --  7.7  --  8.1  RBC 2.26*   < > 2.29* 2.20* 2.50*  HGB 6.5*  --  6.4*  --  7.2*  HCT 21.4*  --  21.9*  --  23.6*  MCV 94.7  --  95.6  --  94.4  MCH 28.8  --  27.9  --  28.8  MCHC 30.4  --  29.2*  --  30.5  RDW 18.6*  --  19.0*  --  18.4*  PLT 266  --  257  --  256   < > = values in this interval not displayed.    Cardiac EnzymesNo results for input(s): TROPONINI in the last 168 hours. No results for input(s): TROPIPOC in the last 168 hours.   BNP Recent Labs  Lab 05/13/19 1930  BNP 163.6*     DDimer No results for input(s): DDIMER in the last 168 hours.   Radiology    DG Chest 2 View  Result Date: 05/13/2019 CLINICAL DATA:  Shortness of breath EXAM: CHEST - 2 VIEW COMPARISON:  March 16, 2019 FINDINGS: There is a dual lead AICD. Multiple sternal wires are present. Mild, chronic appearing increased interstitial lung markings are seen. Very mild atelectasis is noted within the bilateral lung bases. There is no evidence of a pleural effusion or pneumothorax. The cardiac silhouette is markedly enlarged. Multilevel degenerative changes seen throughout the thoracic spine. IMPRESSION: 1. Marked cardiomegaly. 2. Mild, chronic appearing increased interstitial lung markings. A mild superimposed component of interstitial edema cannot be excluded. 3. Mild bibasilar atelectasis. Electronically Signed   By: Virgina Norfolk M.D.   On: 05/13/2019 18:52    Telemetry    05/15/19 NSR, AV paced in the 60's. One episode VT, 7 beat early this AM - Personally Reviewed  ECG    No new tracing as of 05/15/19- Personally Reviewed  Cardiac Studies   Transesophageal Echo 03/21/2019 IMPRESSIONS    1. Left ventricular ejection fraction, by visual estimation, is 55 to  60%. The left ventricle has normal function. Normal left ventricular size.  There is no left ventricular hypertrophy.  2. The left ventricle has no regional wall motion abnormalities.  3. Global right ventricle has normal systolic function.The right  ventricular size is normal. No increase in right ventricular wall  thickness.  4. Both RA and RV pacer wires are normal. No vegetations.  5. Left atrial size was moderately dilated.  6. Right atrial size was normal.  7. The mitral valve has been repaired/replaced. No evidence of mitral  valve regurgitation. No evidence of mitral stenosis.  8. Mechanical mitral valve is functioning normally. No abscess. No  rocking.  9. The tricuspid valve is normal in structure. Tricuspid valve  regurgitation is severe.  10. The aortic valve is tricuspid. Aortic valve regurgitation is mild.  Mild to moderate aortic valve sclerosis/calcification without any evidence  of aortic stenosis.  11. The pulmonic valve was normal in structure. Pulmonic valve  regurgitation is mild.  12. A pacer wire is visualized.  13. The inferior vena cava is normal in size with greater than 50%  respiratory variability, suggesting right atrial pressure of 3 mmHg.  14. No vegetations. No endocarditis.   TTE 03/19/19 1. No evidence of infective endocarditis on this study. A TEE is  recommended to exclude endocarditis.  2. The patient is s/p myomectomy with mechanical MVR and AoV repair  (reported in record). Overall, the EF remains unchanged with prior. The  mechanical MV prosthesis is functioning normally. The AoV is severely  calcified with restricted  movement. I  suspect there is at least mild to moderate aortic stenosis, but doppler  interrogation on this study is suboptimal. The tricuspid regurgitation on  this study is severe with systolic hepatic vein flow reversal (not well  evaluated on last study). Findings  compared directly with study from 02/14/2019.  3. Left ventricular ejection fraction, by visual estimation, is 55 to  60%. The left ventricle has normal function. There is  mildly increased  left ventricular hypertrophy.  4. Definity contrast agent was given IV to delineate the left ventricular  endocardial borders.  5. Left ventricular diastolic function could not be evaluated.  6. Right ventricular volume overload.  7. The left ventricle has no regional wall motion abnormalities.  8. Global right ventricle has severely reduced systolic function.The  right ventricular size is severely enlarged. No increase in right  ventricular wall thickness.  9. Left atrial size was mild-moderately dilated.  10. Right atrial size was severely dilated.  11. The mitral valve has been repaired/replaced. No evidence of mitral  valve regurgitation.  12. Mechanical mitral valve present. Mean gradient 5 mmHG with heart rate  60 bpm. EOA 5 mmHG. Normal functioning prosthetic valve with stable  gradients compared with prior.  13. The tricuspid valve is grossly normal. Tricuspid valve regurgitation  is severe.  14. Aortic valve regurgitation is mild. Mild to moderate aortic valve  stenosis.  15. The aortic valve is severely calcified with restricted leaflet motion.  Doppler interrogation is poor on this study. Vmax 2.5 m/s, Mean gradient  ~8.5 mmHG, but suspect this is severely underestimated. There is likely a  mild to moderate degree of  aortic stenosis present.  16. The pulmonic valve was grossly normal. Pulmonic valve regurgitation is  not visualized.  17. Mild plaque invoving the aortic root.  18. Normal pulmonary artery systolic  pressure.  19. The tricuspid regurgitant velocity is 1.76 m/s, and with an assumed  right atrial pressure of 15 mmHg, the estimated right ventricular systolic  pressure is normal at 27.4 mmHg.  20. A pacer wire is visualized in the RA and RV.   Patient Profile     74 y.o. 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   Assessment & Plan    1. 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  -Continue IV Lasix infusion  -Breathing improved -Baseline weights appear to be in the 335-340lb range.  -Currently on Lasix  -Weight, 343lb today>>>down from 347lb 2/3 -I&O, net positive 46ml   2. Valvular heart disease and chronic anticoagulation:  -Mechanical MVR, severe TR, calcific native aortic valve disease -Coumadin on hold for elevated INR, given vita K 2.5mg  x 1  3. Hyperkalemia: -Given Lokema 10mg  x 2, follow closely on lasix drip -K+ today>>>5.5  -One 7 beat VT episode this AM>>no recurrence   4. Acute on chronic anemia -Baseline hemoglobin around 8.5 -Hgb of 7.2 yesterday  -Transfused PRBCs per primary   5. Suspected OSA/OHS: -Will need outpatient sleep study  6. Recent bacteremia 2nd to RLE cellulitis -Stable with wraps to BLE   Signed, Kathyrn Drown NP-C  [place] Pager: [phone] 05/15/2019, 7:55 AM     For questions or updates, please contact   Please consult [email or web address] for contact info under Cardiology/STEMI.  I have seen and examined the patient along with Kathyrn Drown NP-C.  I have reviewed the  chart, notes and new data.  I agree with PA/NP's note.  Key new complaints: Feeling much better.  More alert and breathing more comfortably.  Edema improving. Key examination changes: Ins/outs only show meager diuresis, but reportedly weight is down by 4 pounds.  Definite new wrinkling with a reduced edema in the left lower extremity (he reports that the right lower extremity is always much more swollen ever since he had one of his ablation procedures).  Lungs are clear.  The jugular veins are difficult to see but appear to be inflated all the way to the angle of the jaw with prominent V waves.  Short systolic murmur on either side of the sternum, likely representing severe tricuspid insufficiency. Key new findings / data: Potassium markedly improved.  The renal function is marginally worse.  Increase in hemoglobin less than expected after 2 units PRBC.  Hemoglobin still low at 7.2.  Iron studies show low transferrin saturation at 15% and relatively low ferritin at 40 consistent with iron deficiency.  PLAN: Continue intravenous diuretics. Monitor potassium daily. Hemoccult stools.  Note this was positive in December 2020. Check LDH for possible hemolysis. May need additional transfusion. Repeat echo and consider right heart catheterization once we have achieved substantial diuresis.  Reassess tricuspid regurgitation to see how much of it may be functional due to RV dilation and how much may be permanent related to previous device lead extraction. Consider assistance from the heart failure service.   Sanda Klein, MD, [place] [phone] 05/15/2019, 10:37 AM

## 2019-05-15 NOTE — Progress Notes (Signed)
Rehab Admissions Coordinator Note:  Patient was screened by Cleatrice Burke for appropriateness for an Inpatient Acute Rehab Consult per OT recs. .  At this time, we are recommending Inpatient Rehab consult. I will place order per protocol for rehab assessment.   Cleatrice Burke RN MSN 05/15/2019, 11:28 AM  I can be reached at [phone].

## 2019-05-15 NOTE — Consult Note (Addendum)
Advanced Heart Failure Team Consult Note   Primary Physician: [name], Frank [name] PCP-Cardiologist:  [name], Frank [name]  Reason for Consultation: Heart Failure   HPI:    Frank [name] is seen today for evaluation of heart failure  at the request of  Dr Cyndia Skeeters.    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 Says he had sleep study and it was negative for sleep apnea on the written report. He is not interested in another sleep study.   In November 2020 he had colonoscopy for rectal bleeding and anemia. Had ext/int hemorrhoids. Has had ongoing black stools.   Admitted back December 2020 and treated for A/C diastolic heart failure group G streptococcal bacteremia due to cellulitis. On TEE there was no evidence of valvular/lead vegetation.  He was discharged on augmentin for 10 days. His noted he missed 3 doses after discharge due to late dismissal from the hospital. Says legs initially al little better but he later started keflex per Dr Drucilla Schmidt on 1/25.   Over the last couple of week he has had a 20 pound weight gain. Having more difficulty walking around.   Presented to [facility] with increased dyspnea and leg edema. CXR with vascular congestion. Given IV lasix bolus followed by lasix drip at 10 mg per hour. Started on  lasix. Hyperkalemia treated in the ED with lokelma.  Pertinent admission labs included creatinine 2,67, BUN 87, K 7, hgb 7.2, INR 7.6 and BNP 163, HS Trop 53.   Sluggish urine output noted. GI consulted for anemia. INR has been supra-therapeutic. Possible EGD.   He denies SOB. He does not use oxygen at home.   Echo 05/15/19  Left ventricular ejection fraction, by visual estimation, is 55  to 60%. The left ventricle has normal function. Left ventricular septal wall thickness was mildly increased. There is no left ventricular hypertrophy. 2. The left ventricle has no regional wall motion abnormalities. 3. 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 4. Global right ventricle has moderately reduced systolic function.The right ventricular size is moderately enlarged. No increase in right ventricular wall thickness. 5. Left atrial size was moderately dilated. 6. Right atrial size was severely dilated. 7. The mitral valve has been repaired/replaced. No evidence of mitral valve regurgitation. 8. Normal appearing mechanical MVR Compared to TTE done 03/19/19 mean gradient gone from 5-6.5 mmHg with MVA 2.4-> 2.1 cm2 by PT1/2. 9. The tricuspid valve is degenerative. 10. The tricuspid valve is degenerative. 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 11. Pacing wire across TV seems redundant and possibly interferes with TV This and previous lead removal likely contributes to patients TR and right heart failure. 12. Aortic valve regurgitation is trivial. Mild aortic valve stenosis. 13. Historically the AV has been repaired after surgery for HOCM/Ablation damaged valve. The right and non coronary cusps are thickened/calcified and may have been plicated Compared to TTE done 03/19/19 mean gradient gone from 9-> 11 mmHg and peak from 16 to 22 mmHg. 14. Pulmonic regurgitation is mild. 15. The pulmonic valve was not well visualized. Pulmonic valve regurgitation is mild. 16. Mildly elevated pulmonary artery systolic pressure. 17. The interatrial septum was not well visualized. Review of Systems: [y] = yes, [ ]  = no   .  General: Weight gain [ Y]; Weight loss [ ] ; Anorexia [ ] ; Fatigue [ Y]; Fever [ ] ; Chills [ ] ; Weakness [Y ]  . Cardiac: Chest pain/pressure [ ] ; Resting SOB [ ] ; Exertional SOB [Y ]; Orthopnea [ ] ; Pedal Edema [ ] ; Palpitations [ ] ; Syncope [ ] ;  Presyncope [ ] ; Paroxysmal nocturnal dyspnea[ ]   . Pulmonary: Cough [ ] ; Wheezing[ ] ; Hemoptysis[ ] ; Sputum [ ] ; Snoring [ ]   . GI: Vomiting[ ] ; Dysphagia[ ] ; Melena[ ] ; Hematochezia [ ] ; Heartburn[ ] ; Abdominal pain [ ] ; Constipation [ ] ; Diarrhea [ ] ; BRBPR [ ]   . GU: Hematuria[ ] ; Dysuria [ ] ; Nocturia[ ]   . Vascular: Pain in legs with walking [ ] ; Pain in feet with lying flat [ ] ; Non-healing sores [ ] ; Stroke [ ] ; TIA [ ] ; Slurred speech [ ] ;  . Neuro: Headaches[ ] ; Vertigo[ ] ; Seizures[ ] ; Paresthesias[ ] ;Blurred vision [ ] ; Diplopia [ ] ; Vision changes [ ]   . Ortho/Skin: Arthritis [ ] ; Joint pain [Y ]; Muscle pain [ ] ; Joint swelling [ ] ; Back Pain [ ] ; Rash [ ]   . Psych: Depression[ ] ; Anxiety[ ]   . Heme: Bleeding problems [ ] ; Clotting disorders [ ] ; Anemia [ ]   . Endocrine: Diabetes [ ] ; Thyroid dysfunction[ ]   Home Medications Prior to Admission medications   Medication Sig Start Date End Date Taking? Authorizing Provider  acetaminophen (TYLENOL) 325 MG tablet Take 2 tablets (650 mg total) by mouth every 8 (eight) hours as needed for mild pain or fever. 03/31/19  Yes Hongalgi, Lenis Dickinson, Frank [name]  Amino Acids-Protein Hydrolys (FEEDING SUPPLEMENT, PRO-STAT SUGAR FREE 64,) LIQD Take 30 mLs by mouth 2 (two) times daily. Patient taking differently: Take 30 mLs by mouth daily as needed (For Protein).  03/31/19  Yes Hongalgi, Lenis Dickinson, Frank [name]  Ascorbic Acid (VITAMIN C) 1000 MG tablet Take 2,000 mg by mouth daily.     Yes Provider, Historical, Frank [name]  aspirin 81 MG tablet Take 81 mg by mouth daily.   Yes Provider, Historical, Frank [name]  benzonatate (TESSALON) 100 MG capsule Take 1 capsule (100 mg total) by mouth 2 (two) times daily as needed for cough. 04/22/19  Yes [name], Frank [name]  Camphor-Eucalyptus-Menthol (VICKS VAPORUB EX) Apply 1 application topically daily as needed (For congestion).   Yes Provider, Historical, Frank [name]  cephALEXin (KEFLEX) 500 MG capsule Take 500 mg by mouth 4 (four) times daily. Take for 10  days   Yes Provider, Historical, Frank [name]  diphenhydramine-acetaminophen (TYLENOL PM) 25-500 MG TABS tablet Take 2 tablets by mouth at bedtime as needed (for sleep).   Yes Provider, Historical, Frank [name]  Ensure Plus (ENSURE PLUS) LIQD Take 237 mLs by mouth 2 (two) times daily between meals.   Yes Provider, Historical, Frank [name]  ferrous sulfate 325 (65 FE) MG tablet Take 325 mg by mouth daily with breakfast.   Yes Provider, Historical, Frank [name]  fish oil-omega-3 fatty acids 1000 MG capsule Take 4 g by mouth every morning.    Yes Provider, Historical, Frank [name]  HYDROcodone-acetaminophen (NORCO/VICODIN) 5-325 MG tablet Take 1 tablet by mouth every 8 (eight) hours as needed for moderate pain or severe pain. 04/09/19  Yes [name], Frank [name]  Magnesium 250 MG TABS Take 500-750 mg by mouth See admin instructions. 750 mg in the morning, 500 mg in the evening   Yes Provider, Historical, Frank [name]  Multiple Vitamin (MULTIVITAMIN) tablet Take 1 tablet by mouth daily.     Yes Provider, Historical, Frank [name]  omeprazole (PRILOSEC) 20  MG capsule Take 1 capsule (20 mg total) by mouth daily. 04/08/19  Yes [name], Frank [name]  potassium chloride (KLOR-CON) 10 MEQ tablet Take 2 tablets (20 mEq total) by mouth 2 (two) times daily. Patient taking differently: Take 30 mEq by mouth 2 (two) times daily.  04/21/19  Yes Baldwin Jamaica, PA-C  torsemide (DEMADEX) 20 MG tablet Take 3 tablets (60 mg total) by mouth 2 (two) times daily. Take an extra tablet as needed 04/21/19  Yes [name], Frank [name]  warfarin (COUMADIN) 10 MG tablet Take 10-15 mg by mouth See admin instructions. Takes 10 mg po QD; extra 5 mg on Sunday and Wednesday    Yes [name], Frank [name]  Wheat Dextrin (BENEFIBER) CHEW Chew 3 tablets by mouth daily.   Yes Provider, Historical, Frank [name]  Ensure Max Protein (ENSURE MAX PROTEIN) LIQD Take 330 mLs (11 oz total) by mouth daily. Patient not taking: Reported on 05/14/2019 04/01/19   Modena Jansky, Frank [name]  JANTOVEN 10 MG tablet Take 0.5 tablets (5 mg total) by  mouth daily at 6 PM. Take above dose until follow up with your Coumadin clinic then follow their instructions for dosing. Patient not taking: Reported on 05/14/2019 03/31/19   Modena Jansky, Frank [name]  enoxaparin (LOVENOX) 150 MG/ML injection Inject 1 mL (150 mg total) into the skin every 12 (twelve) hours. 02/20/19 03/16/19  [name], Frank [name]  fondaparinux (ARIXTRA) 10 MG/0.8ML SOLN injection Inject 0.8 mLs (10 mg total) into the skin daily. 02/20/19 03/16/19  [name], Frank [name]    Past Medical History: Past Medical History:  Diagnosis Date  . Atrial arrhythmia    status post ablation in [place] with complication including damage to the aortic valve  . Atrial fibrillation ([place])   . AV block, 1st degree   . Bradycardia   . Cluster headache ~ 1980   "went away after I started taking one of the heart RXs" (06/29/2017)  . Diastolic CHF, chronic ([place])   . Diverticulosis   . History of gout   . HTN (hypertension)   . Hypertrophic cardiomyopathy ([place])    s/p myoectomy  . Infection   . Internal hemorrhoids   . Morbid obesity ([place])   . Morbid obesity with BMI of 45.0-49.9, adult ([place])   . Pacemaker  -SJM   . Skin cancer    "burned off RUE X 1; cut off nose X 2" (06/29/2017)  . TIA (transient ischemic attack) 2001  . Tubulovillous adenoma     Past Surgical History: Past Surgical History:  Procedure Laterality Date  . 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 BIV PACEMAKER INSERTION CRT-P N/A 07/27/2017   Procedure: BIV PACEMAKER INSERTION CRT-P;  Surgeon: [name], Frank [name];  Location: [place] [facility];  Service: Cardiovascular;  Laterality: N/A;  . BIV UPGRADE N/A 05/28/2017   Procedure: BIV [place];  Surgeon: [name], Frank [name];  Location: [place] [facility];  Service: Cardiovascular;  Laterality: N/A;  . CARDIAC CATHETERIZATION     "couple times" (06/29/2017)  . CARDIAC VALVE REPLACEMENT    . COLONOSCOPY    . COLONOSCOPY  WITH PROPOFOL N/A 04/13/2016   Procedure: COLONOSCOPY WITH PROPOFOL;  Surgeon: Gatha Mayer, Frank [name];  Location: [facility];  Service: Endoscopy;  Laterality: N/A;  . COLONOSCOPY WITH PROPOFOL N/A 02/28/2019   Procedure: COLONOSCOPY WITH PROPOFOL;  Surgeon: Gatha Mayer, Frank [name];  Location: [facility];  Service: Endoscopy;  Laterality: N/A;  . INSERT / REPLACE / REMOVE PACEMAKER  ~ [address] Jude [employer]   . 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  . PACEMAKER LEAD REMOVAL N/A 06/29/2017   Procedure: [id number]  REMOVAL AND Four LEAD REMOVAL;  Surgeon: Evans Lance, Frank [name];  Location: [place];  Service: Cardiovascular;  Laterality: N/A;  . PACEMAKER REMOVAL  06/29/2017   GERNERATOR  REMOVAL AND Four LEAD REMOVAL; put in external pacemaker (06/29/2017)  . septal myectomy  07/22/1997  . SKIN CANCER EXCISION    . TEE WITHOUT CARDIOVERSION N/A 03/21/2019   Procedure: TRANSESOPHAGEAL ECHOCARDIOGRAM (TEE);  Surgeon: Jerline Pain, Frank [name];  Location: [facility] ENDOSCOPY;  Service: Cardiovascular;  Laterality: N/A;    Family History: Family History  Problem Relation Age of Onset  . Heart disease Sister   . Uterine cancer Sister   . Obesity Sister        500+ lbs  . Heart disease Brother   . Heart disease Sister   . Heart disease Father   . Colon cancer Father 41  . Crohn's disease Paternal Grandmother     Social History: Social History   Socioeconomic History  . Marital status: Married    Spouse name: Not on file  . Number of children: 1  . Years of education: Not on file  . Highest education level: Not on file  Occupational History  . Occupation: Corporate investment banker, retired    Fish farm manager: [employer]  Tobacco Use  . Smoking status: Former Smoker    Years: 10.00    Types: Cigarettes, Pipe, Cigars    Quit date: 04/10/1977    Years since quitting: 42.1  . Smokeless tobacco: Never Used  Substance and Sexual Activity  . Alcohol use: Yes    Alcohol/week: 2.0 standard  drinks    Types: 1 Glasses of wine, 1 Cans of beer per week  . Drug use: No  . Sexual activity: Not Currently  Other Topics Concern  . Not on file  Social History Narrative   Married, one adopted son and one daughter. 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   2 caffeinated beverages daily   Social Determinants of Health   Financial Resource Strain:   . Difficulty of Paying Living Expenses: Not on file  Food Insecurity: No Food Insecurity  . Worried About Charity fundraiser in the Last Year: Never true  . Ran Out of Food in the Last Year: Never true  Transportation Needs: No Transportation Needs  . Lack of Transportation (Medical): No  . Lack of Transportation (Non-Medical): No  Physical Activity:   . Days of Exercise per Week: Not on file  . Minutes of Exercise per Session: Not on file  Stress:   . Feeling of Stress : Not on file  Social Connections:   . Frequency of Communication with Friends and Family: Not on file  . Frequency of Social Gatherings with Friends and Family: Not on file  . Attends Religious Services: Not on file  . Active Member of Clubs or Organizations: Not on file  . Attends Archivist Meetings: Not on file  . Marital Status: Not on file    Allergies:  Allergies  Allergen Reactions  . Warfarin And Related Other (See Comments)    COUMADIN or JANTOVEN BRAND ONLY** Per pt, was switched to generic in 2001 and had significantly decreased absorption > INR subtherapeutic> pt  suffered a TIA and has been on brand name only since     Objective:    Vital Signs:   Temp:  [97.5 F (36.4 C)-98.3 F (36.8 C)] 97.5 F (36.4 C) (02/04 1359) Pulse Rate:  [60-68] 60 (02/04 1359) Resp:  [7-19] 15 (02/04 0800) BP: (112-136)/(53-85) 135/59 (02/04 1359) SpO2:  [91 %-100 %] 100 % (02/04 1359) Weight:  [155.6 kg] 155.6 kg (02/04 0454) Last BM Date: 05/13/19  Weight change: Filed Weights   05/14/19 0027 05/14/19  0654 05/15/19 0454  Weight: (!) 157.4 kg (!) 157.4 kg (!) 155.6 kg    Intake/Output:   Intake/Output Summary (Last 24 hours) at 05/15/2019 1444 Last data filed at 05/15/2019 1300 Gross per 24 hour  Intake 1437.89 ml  Output 750 ml  Net 687.89 ml      Physical Exam    General:  Pale , Sitting on the side of the bed. . No resp difficulty HEENT: normal Neck: supple. JVP to jaw . Carotids 2+ bilat; no bruits. No lymphadenopathy or thyromegaly appreciated. Cor: PMI nondisplaced. Regular rate & rhythm. No rubs, gallops. TR  Lungs: clear on 2 liters [place] Abdomen: soft, nontender, nondistended. No hepatosplenomegaly. No bruits or masses. Good bowel sounds. Extremities: no cyanosis, clubbing, rash, R and LLE 3+ with unna boots.  Neuro: alert & orientedx3, cranial nerves grossly intact. moves all 4 extremities w/o difficulty. Affect pleasant   Telemetry   A V Paced 66 Personally reviewed   EKG    SR 62 bpm   Labs   Basic Metabolic Panel: Recent Labs  Lab 05/13/19 1759 05/13/19 1759 05/14/19 0435 05/14/19 1210 05/15/19 0559  NA 131*  --  132* 129* 131*  K 7.0*  --  6.4* 6.0* 5.5*  CL 89*  --  91* 90* 91*  CO2 28  --  29 29 29   GLUCOSE 105*  --  94 100* 125*  BUN 87*  --  89* 93* 98*  CREATININE 2.67*  --  2.77* 2.76* 2.91*  CALCIUM 9.0   < > 8.8* 8.6* 8.7*  MG  --   --   --   --  3.1*  PHOS  --   --   --  4.6 5.7*   < > = values in this interval not displayed.    Liver Function Tests: Recent Labs  Lab 05/14/19 0435 05/14/19 1210 05/15/19 0559  AST 40  --   --   ALT 15  --   --   ALKPHOS 140*  --   --   BILITOT 1.4*  --   --   PROT 7.2  --   --   ALBUMIN 2.5* 2.4* 2.5*   No results for input(s): LIPASE, AMYLASE in the last 168 hours. No results for input(s): AMMONIA in the last 168 hours.  CBC: Recent Labs  Lab 05/13/19 1759 05/14/19 0435 05/14/19 0819 05/15/19 0559 05/15/19 1109  WBC 8.2 8.2 7.7 8.1  --   NEUTROABS  --  5.5  --   --   --   HGB 7.2*  6.5* 6.4* 7.2* 7.2*  HCT 25.3* 21.4* 21.9* 23.6* 23.7*  MCV 100.0 94.7 95.6 94.4  --   PLT 280 266 257 256  --     Cardiac Enzymes: No results for input(s): CKTOTAL, CKMB, CKMBINDEX, TROPONINI in the last 168 hours.  BNP: BNP (last 3 results) Recent Labs    03/16/19 2359 05/13/19 1930  BNP 105.7* 163.6*    ProBNP (last 3  results) No results for input(s): PROBNP in the last 8760 hours.   CBG: Recent Labs  Lab 05/14/19 1128  GLUCAP 96    Coagulation Studies: Recent Labs    05/13/19 July 02, 2131 05/14/19 0435 05/14/19 1428 05/15/19 0559  LABPROT 61.8* 64.5* 57.7* 56.8*  INR 7.2* 7.6* 6.6* 6.4*     Imaging   ECHOCARDIOGRAM COMPLETE  Result Date: 05/15/2019   ECHOCARDIOGRAM REPORT   Patient Name:   DUWAYNE MATTERS Schara Date of Exam: 05/15/2019 Medical Rec #:  [id number]       Height:       71.0 in Accession #:    [id number]      Weight:       343.0 lb Date of Birth:  16-Dec-1945      BSA:          2.65 m Patient Age:    29 years        BP:           131/69 mmHg Patient Gender: M               HR:           60 bpm. 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 CHF and Hypertrophic Cardiomyopathy,                 Pacemaker, Arrythmias:Atrial Fibrillation; Risk                 Factors:Hypertension.  Sonographer:    Mikki Santee RDCS (AE) Referring Phys: [phone] [facility] CRO[facility]U  Sonographer Comments: Image acquisition challenging due to patient body habitus. IMPRESSIONS  1. Left ventricular ejection fraction, by visual estimation, is 55 to 60%. The left ventricle has normal function. Left ventricular septal wall thickness was mildly increased. There is no left ventricular hypertrophy.  2. The left ventricle has no regional wall motion abnormalities.  3. Post septal myectomy for HOCM mild residual septal  hypertrophy.  4. Global right ventricle has moderately reduced systolic function.The right ventricular size is moderately enlarged. No increase in right ventricular wall thickness.  5. Left atrial size was moderately dilated.  6. Right atrial size was severely dilated.  7. The mitral valve has been repaired/replaced. No evidence of mitral valve regurgitation.  8. Normal appearing mechanical MVR Compared to TTE done 03/19/19 mean gradient gone from 5-6.5 mmHg with MVA 2.4-> 2.1 cm2 by PT1/2.  9. The tricuspid valve is degenerative. 10. The tricuspid valve is degenerative. 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 11. Pacing wire across TV seems redundant and possibly interferes with TV This and previous lead removal likely contributes to patients TR and right heart failure. 12. Aortic valve regurgitation is trivial. Mild aortic valve stenosis. 13. Historically the AV has been repaired after surgery for HOCM/Ablation damaged valve. The right and non coronary cusps are thickened/calcified and may have been plicated Compared to TTE done 03/19/19 mean gradient gone from 9-> 11 mmHg and peak from 16  to 22 mmHg. 14. Pulmonic regurgitation is mild. 15. The pulmonic valve was not well visualized. Pulmonic valve regurgitation is mild. 16. Mildly elevated pulmonary artery systolic pressure. 17. The interatrial septum was not well visualized. FINDINGS  Left Ventricle: Left ventricular ejection fraction, by visual  estimation, is 55 to 60%. The left ventricle has normal function. The left ventricle has no regional wall motion abnormalities. The left ventricular internal cavity size was the left ventricle is normal in size. There is no left ventricular hypertrophy. 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 Right Ventricle: The right ventricular size is moderately enlarged. No increase in right ventricular wall thickness. Global RV systolic function is has moderately reduced systolic function. The  tricuspid regurgitant velocity is 2.45 m/s, and with an assumed right atrial pressure of 15 mmHg, the estimated right ventricular systolic pressure is mildly elevated at 39.0 mmHg. Left Atrium: Left atrial size was moderately dilated. Right Atrium: Right atrial size was severely dilated Pericardium: There is no evidence of pericardial effusion. Mitral Valve: The mitral valve has been repaired/replaced. No evidence of mitral valve regurgitation. MV peak gradient, 18.7 mmHg. Normal appearing mechanical MVR Compared to TTE done 03/19/19 mean gradient gone from 5-6.5 mmHg with MVA 2.4-> 2.1 cm2 by PT1/2. Tricuspid Valve: The tricuspid valve is degenerative in appearance. 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 Pacing wire across TV seems redundant and possibly interferes with TV This and previous lead removal likely contributes to patients TR and right heart failure. Aortic Valve: The aortic valve has been repaired/replaced. Aortic valve regurgitation is trivial. Mild aortic stenosis is present. Aortic valve mean gradient measures 11.0 mmHg. Aortic valve peak gradient measures 22.3 mmHg. Aortic valve area, by VTI measures 1.69 cm. Historically the AV has been repaired after surgery for HOCM/Ablation damaged valve. The right and non coronary cusps are thickened/calcified and may have been plicated Compared to TTE done 03/19/19 mean gradient gone from 9-> 11 mmHg and peak from 16 to 22 mmHg. Pulmonic Valve: The pulmonic valve was not well visualized. Pulmonic valve regurgitation is mild. Pulmonic regurgitation is mild. Aorta: The aortic root is normal in size and structure. IAS/Shunts: The interatrial septum was not well visualized.  LEFT VENTRICLE PLAX 2D LVOT diam:     2.40 cm LVOT Area:     4.52 cm  RIGHT VENTRICLE RV S prime:     9.90 cm/s TAPSE (M-mode): 1.8 cm LEFT ATRIUM              Index       RIGHT ATRIUM           Index LA Vol (A2C):   151.0 ml 56.94 ml/m RA Area:     30.90 cm LA Vol (A4C):   137.0  ml 51.66 ml/m RA Volume:   109.00 ml 41.10 ml/m LA Biplane Vol: 148.0 ml 55.81 ml/m  AORTIC VALVE AV Area (Vmax):    1.47 cm AV Area (Vmean):   1.57 cm AV Area (VTI):     1.69 cm AV Vmax:           236.33 cm/s AV Vmean:          153.000 cm/s AV VTI:            0.507 m AV Peak Grad:      22.3 mmHg AV Mean Grad:      11.0 mmHg LVOT Vmax:         76.70 cm/s LVOT Vmean:        53.100 cm/s LVOT VTI:          0.190 m LVOT/AV VTI ratio: 0.37 MITRAL VALVE                         TRICUSPID VALVE  MV Area (PHT): 2.13 cm              TR Peak grad:   24.0 mmHg MV Peak grad:  18.7 mmHg             TR Vmax:        254.00 cm/s MV Mean grad:  6.5 mmHg MV Vmax:       2.16 m/s              SHUNTS MV Vmean:      109.5 cm/s            Systemic VTI:  0.19 m MV VTI:        0.61 m                Systemic Diam: 2.40 cm MV PHT:        103.24 msec MV Decel Time: 356 msec MV E velocity: 172.00 cm/s 103 cm/s MV A velocity: 60.60 cm/s  70.3 cm/s MV E/A ratio:  2.84        1.5  Frank [name] Electronically signed by Frank [name] Signature Date/Time: 05/15/2019/12:48:20 PM    Final       Medications:     Current Medications: . sodium chloride   Intravenous Once  . acetaminophen  1,000 mg Oral Q8H  . amoxicillin  500 mg Oral Q8H  . ferrous sulfate  325 mg Oral Q breakfast  . magnesium oxide  400 mg Oral QPM  . magnesium oxide  600 mg Oral Daily  . mouth rinse  15 mL Mouth Rinse BID  . pantoprazole (PROTONIX) IV  40 mg Intravenous Q12H  . Warfarin - Pharmacist Dosing Inpatient   Does not apply q1800     Infusions: . furosemide (LASIX) infusion 10 mg/hr (05/15/19 3267)       Assessment/Plan   1. 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  -Volume status elevated with sluggish diuresis noted.  Increae lasix to 20 mg per hour.   -Add diamox 250 mg twice a day.  - May need to add milrinone to support RV and assist with diuresis.  - RHC once diuresed.  - Unable to place picc at  his last hospitalization due to scar tissue   2. Anemia  GI consulted.  - Had colonoscopy in 02/2019 with ext/int hemmorhoids.  -Hgb down. No obvious source.  -Check LDH for hemolysis  3. Mechanical MVR  -INR supratherapeutic .  -Coumadin has been on hold.   4. Tricuspid Disease - Once diuresed will likely need TEE to further evaluate -Not likely surgical candidate for valve repair with previous surgery.  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 Supra Therapeutic INR  INR 6.6 off coumadin.   6.  Cellulitis  LLE on antibiotics.   7. AKI on CKD  -Baseline creatinine < 2.0  -Creatinine on admit 2.67  8. Obesity  Body mass index is 47.84 kg/m.    Length of Stay: 2  Frank Grinder, Frank [name]  05/15/2019, 2:44 PM  Advanced Heart Failure Team Pager [phone] (M-F; 7a - 4p)  Please contact [facility] for night-coverage after hours (4p -7a ) and weekends on [email or web address]   Patient seen and examined with the above-signed Advanced Practice Provider and/or Housestaff. I personally reviewed laboratory data, imaging studies and relevant notes. I independently examined the patient and formulated the important aspects of the plan. I have edited the note to reflect any of my changes or  salient points. I have personally discussed the plan with the patient and/or family.  Very complicated case as described above. 74 y/o male with morbid obesity, OSA/OHS and HOCM s/p previous myomectomy, h/o AoV perforation requiring repair. Frank s/p mechanical MVR. 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 H/o lead removal and replacement due to bacteremia. Recently admitted with Group G strep bacteremia.   Now readmitted with severe RHF and massive volume overload with severe anemia.   On review of TEE from 12/20   LVEF normal. AV thickened with at least mild AS. 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 RV moderate dysfunction.   On  exam General:  Chronically ill appearing. No resp difficulty HEENT: normal Neck: supple. JVP to ear with prominent CV waves. Carotids 2+ bilat; no bruits. No lymphadenopathy or thryomegaly appreciated. Cor: PMI nondisplaced. Regular rate & rhythm. Mechanical s1. 2/6 AS 2/6 TR Lungs: clear Abdomen: obese soft, nontender, + distended. No hepatosplenomegaly. No bruits or masses. Good bowel sounds. Extremities: no cyanosis, clubbing, rash, 3-4+ edema. With unna boots Neuro: alert & orientedx3, cranial nerves grossly intact. moves all 4 extremities w/o difficulty. Affect pleasant  He has severe RHF with massive volume overload. There is wide open TR on TEE. Unclear if this is related to valve tethering from pacer wire or (more likely) partially valve destruction from previous lead removal. Ideally would need TVR but this is not possible for him.  Would focus on aggressive diuresis and see how good we can get him. May need inotrope support. Will also need w/u for hemolysis.  Plan - increase lasix gtt to 20 - add diamox - check LDH - eventual RHC when INR down.  - low threshold to add milrinone.   Prognosis guarded.   Frank Bickers, Frank [name]  7:01 PM

## 2019-05-15 NOTE — Evaluation (Signed)
Physical Therapy Evaluation Patient Details Name: Frank [name] MRN: [id number] DOB: 1945-10-08 Today's Date: 05/15/2019   History of Present Illness  Pt is a 74 y/o male with PMH of diverticulosis, hemorrhoids, a fib, complete heart block s/p PPM, hypertropic obstructive cardiomyopathy s/p myectomy with mechanical mitral valve replacement and aortic valve repair, CHF, HTN, TIA, LB lymphedema presenting to ED, SOB and back pain.  Pt with cellulitis of R LE, CHF, and questionable infection.  Clinical Impression   Patient received in bed, sleeping but easily woken and willing to participate in therapy. Limited session to sidesteps at EOB only due to elevated INR at eval and chair in room not appropriate given BLE edema. See below for mobility/assist levels. Moves slowly and needs extended time/effort with rest breaks in between transitions but generally min guard to light MinA with RW. He was left sitting at EOB with family providing direct supervision and OT right outside room to assist back to bed when ready. Given his report of difficulty with stairs to enter home as well as a recent fall at home, might be appropriate for CIR to improve function and safety prior to return home.     Follow Up Recommendations CIR;Supervision for mobility/OOB    Equipment Recommendations  Other (comment)(defer to next setting)    Recommendations for Other Services       Precautions / Restrictions Precautions Precautions: Fall Restrictions Weight Bearing Restrictions: No      Mobility  Bed Mobility Overal bed mobility: Needs Assistance Bed Mobility: Supine to Sit     Supine to sit: Min assist;+2 for physical assistance Sit to supine: +2 for physical assistance;Mod assist   General bed mobility comments: pt takes a lot of time to mobilize but evenutally can mobilize well given this time. Uses bed height adjustments and rails to assist in bed mobility.  Would not mobilze as well without these.   Primarily uses lift chair at home.  Transfers Overall transfer level: Needs assistance Equipment used: Rolling walker (2 wheeled) Transfers: Sit to/from Stand Sit to Stand: Min assist Stand pivot transfers: Min assist;+2 physical assistance       General transfer comment: Pt utilized walker and needed cues for hand placment and safety.  Ambulation/Gait             General Gait Details: deferred gait due to elevated INR- able to take side steps up EOB with min guard x2  Stairs            Wheelchair Mobility    Modified Rankin (Stroke Patients Only)       Balance Overall balance assessment: Needs assistance Sitting-balance support: Feet supported Sitting balance-Leahy Scale: Good     Standing balance support: Bilateral upper extremity supported;During functional activity Standing balance-Leahy Scale: Poor Standing balance comment: Pt requires outside assist to remain standing.                             Pertinent Vitals/Pain Pain Assessment: Faces Faces Pain Scale: Hurts little more Pain Location: leg, catheter area when moving because of catheter cap(resolved after moving) and low back. Pain Descriptors / Indicators: Aching Pain Intervention(s): Limited activity within patient's tolerance;Repositioned    Home Living Family/patient expects to be discharged to:: Private residence Living Arrangements: Spouse/significant other Available Help at Discharge: Family Type of Home: House Home Access: Stairs to enter Entrance Stairs-Rails: Left Entrance Stairs-Number of Steps: 7 Home Layout: One level Home Equipment: Environmental consultant -  2 wheels;Other (comment)(lift chair) Additional Comments: Pt has lift chair he sleeps in most of the time.    Prior Function Level of Independence: Needs assistance   Gait / Transfers Assistance Needed: walks in home with walker, gets assist with some adls depending on the day.  Did have issues getting back into home when  d/c'd home in Dec.  7 steps difficult to manage.  ADL's / Homemaking Assistance Needed: wife does all homemaking and occasionally assists with adls. Pt does bed bathes or sponge baths. Does not get into shower.        Hand Dominance   Dominant Hand: Right    Extremity/Trunk Assessment   Upper Extremity Assessment Upper Extremity Assessment: Defer to OT evaluation    Lower Extremity Assessment Lower Extremity Assessment: Generalized weakness    Cervical / Trunk Assessment Cervical / Trunk Assessment: Normal  Communication   Communication: No difficulties  Cognition Arousal/Alertness: Awake/alert Behavior During Therapy: WFL for tasks assessed/performed Overall Cognitive Status: Within Functional Limits for tasks assessed                                 General Comments: baseline      General Comments General comments (skin integrity, edema, etc.): erythemous and edematous skin B LEs, swapped out plastic tape securing catheter line to paper tape to reduce skin breakdown. Mouth still actively bleeding.    Exercises     Assessment/Plan    PT Assessment Patient needs continued PT services  PT Problem List Decreased strength;Obesity;Decreased activity tolerance;Decreased safety awareness;Decreased balance;Decreased mobility;Decreased coordination       PT Treatment Interventions DME instruction;Balance training;Gait training;Stair training;Functional mobility training;Patient/family education;Therapeutic activities;Therapeutic exercise    PT Goals (Current goals can be found in the Care Plan section)  Acute Rehab PT Goals Patient Stated Goal: to feel stronger and to sleep PT Goal Formulation: With patient Time For Goal Achievement: 05/29/19 Potential to Achieve Goals: Good    Frequency Min 3X/week   Barriers to discharge        Co-evaluation               AM-PAC PT "6 Clicks" Mobility  Outcome Measure Help needed turning from your back to  your side while in a flat bed without using bedrails?: A Little Help needed moving from lying on your back to sitting on the side of a flat bed without using bedrails?: A Little Help needed moving to and from a bed to a chair (including a wheelchair)?: A Little Help needed standing up from a chair using your arms (e.g., wheelchair or bedside chair)?: A Little Help needed to walk in hospital room?: A Little Help needed climbing 3-5 steps with a railing? : A Lot 6 Click Score: 17    End of Session   Activity Tolerance: Patient tolerated treatment well Patient left: in bed;with call bell/phone within reach;with family/visitor present(family providing direct supervision and OT right outside door to assist if needed)   PT Visit Diagnosis: Muscle weakness (generalized) (M62.81);Difficulty in walking, not elsewhere classified (R26.2)    Time: 7628-3151 PT Time Calculation (min) (ACUTE ONLY): 33 min   Charges:   PT Evaluation $PT Eval Moderate Complexity: 1 Mod          Windell Norfolk, DPT, PN1   Supplemental Physical Therapist [place]    Pager [phone] Acute Rehab Office [phone]

## 2019-05-15 NOTE — Progress Notes (Signed)
  Echocardiogram 2D Echocardiogram has been performed.  Jennette Dubin 05/15/2019, 12:14 PM

## 2019-05-15 NOTE — Progress Notes (Signed)
Inpatient Rehabilitation Admissions Coordinator  Inpatient rehab consult received. I met with patient at bedside with his wife. We discussed goals and expectations of an inpt rehab admit. His wife is an SLP and has managed a pediatric AIR in the past. Patient's goals is to be directly discharged home if able, but is open to further discussion of a possible inpt rehab admit if needed when he is medically ready. I will follow his progress.  Danne Baxter, RN, MSN Rehab Admissions Coordinator [phone] 05/15/2019 1:43 PM

## 2019-05-15 NOTE — Consult Note (Addendum)
[facility] Consult: 1:12 PM 05/15/2019  LOS: 2 days    Referring Provider: Dr Gloriajean Dell  Primary Care Physician:  [name], MD Primary Gastroenterologist:  Dr. Carlean Purl  Reason for Consultation: Acute on chronic anemia.   HPI: Frank [name] is a 74 y.o. male.  PMH morbid obesity.  Combined syst/diast HF. Hypertropic cm, s/p myomectomy/ AoV repair/mecahanical MVR. Echo 03/2019 with EF of 55 to 60%, moderate LVH, moderate LAE, severe RAE, moderate to severe TVR.  PAF.  On chronic Warfarin.  Pacemaker in situ.  CKD, stage 3.  Normocytic anemia, chronic oral iron.   Chronic lower extremity edema.  03/2019 admission for streptococcal sepsis from right LE cellulitis.  Patient debilitated at baseline.  Adenomatous, sessile serrated and TVA colon polyps dating back to at least 2004 with recurrences 2007, 2011, 2018.   2011 Colonoscopy.  Polyp surveillance study.  Colon polyps.  Diverticulosis.  Internal hemorrhoids. 04/13/2016 Colonoscopy.  Adenomatous polyp surveillance.  4 polyps removed from sigmoid, transverse, ascending, cecum.  Sigmoid diverticulosis. 02/2019 Colonoscopy: For rectal bleeding, anemia.  Ext/int hemorrhoids.  Left sided diverticulosis.  No fresh, old blood. No previous upper endoscopy.    Started oral iron per Dr. Carlean Purl after the colonoscopy in November.  Also takes Omeprazole 20 mg/day Stools black ever since but no rectal bleeding.  No GI upset, nausea, vomiting.  He has had at least 2 courses of antibiotics, Augmentin then Keflex for cellulitis initially in his right leg but then occurred in the left leg.  Was about day 6/10 of Keflex as of yesterday.  Also reports frequent epistaxis that he has had for years and has probably had 2 or 3 events in the last couple of weeks which are mild to  moderately severe in volume.  He has never regained his previous strength since preadmission in December.  Presented with progressive dyspnea over the last few days, 5 pound weight gain within 24 hours.  No chest pain, fever, chills, N/V, abdominal pain, diarrhea or change in the color of his stools. Hgb 6.4 >> 2 PRBCs >> 7.2, MCV 94.  Baseline of 8.2-8.7 in 03/2019. Iron, TIBC normal.  Low iron sats at 15%.  Ferritin 40.  Folate, B12 okay. Hyperkalemic at 6.4.  Hyponatremic at 129. AKI. INR 7.6 >> 6.4. COVID-19 negative. CXR shows congestion and patient significantly edematous with abdominal distention on exam.  Treatment measures include IV Lasix bolus and drip, Lokelma, IV bicarb and insulin/D50, amoxicillin, oral vitamin K. S/p 2 PRBCs. Patient's breathing feels better today.  Father died in his 26s with colon cancer.    Past Medical History:  Diagnosis Date  . Atrial arrhythmia    status post ablation in [place] with complication including damage to the aortic valve  . Atrial fibrillation ([place])   . AV block, 1st degree   . Bradycardia   . Cluster headache ~ 1980   "went away after I started taking one of the heart RXs" (06/29/2017)  . Diastolic CHF, chronic ([place])   . Diverticulosis   . History of  gout   . HTN (hypertension)   . Hypertrophic cardiomyopathy ([place])    s/p myoectomy  . Infection   . Internal hemorrhoids   . Morbid obesity ([place])   . Morbid obesity with BMI of 45.0-49.9, adult ([place])   . Pacemaker  -SJM   . Skin cancer    "burned off RUE X 1; cut off nose X 2" (06/29/2017)  . TIA (transient ischemic attack) 2001  . Tubulovillous adenoma     Past Surgical History:  Procedure Laterality Date  . 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 BIV PACEMAKER INSERTION CRT-P N/A 07/27/2017   Procedure: BIV PACEMAKER INSERTION CRT-P;  Surgeon: [name], MD;  Location: [facility];  Service:  Cardiovascular;  Laterality: N/A;  . BIV UPGRADE N/A 05/28/2017   Procedure: BIV [place];  Surgeon: [name], MD;  Location: [facility];  Service: Cardiovascular;  Laterality: N/A;  . CARDIAC CATHETERIZATION     "couple times" (06/29/2017)  . CARDIAC VALVE REPLACEMENT    . COLONOSCOPY    . COLONOSCOPY WITH PROPOFOL N/A 04/13/2016   Procedure: COLONOSCOPY WITH PROPOFOL;  Surgeon: Gatha Mayer, MD;  Location: [facility];  Service: Endoscopy;  Laterality: N/A;  . COLONOSCOPY WITH PROPOFOL N/A 02/28/2019   Procedure: COLONOSCOPY WITH PROPOFOL;  Surgeon: Gatha Mayer, MD;  Location: [facility];  Service: Endoscopy;  Laterality: N/A;  . INSERT / REPLACE / REMOVE PACEMAKER  ~ [address] [employer]   . 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  . PACEMAKER LEAD REMOVAL N/A 06/29/2017   Procedure: [id number]  REMOVAL AND Four LEAD REMOVAL;  Surgeon: Evans Lance, MD;  Location: [facility];  Service: Cardiovascular;  Laterality: N/A;  . PACEMAKER REMOVAL  06/29/2017   GERNERATOR  REMOVAL AND Four LEAD REMOVAL; put in external pacemaker (06/29/2017)  . septal myectomy  07/22/1997  . SKIN CANCER EXCISION    . TEE WITHOUT CARDIOVERSION N/A 03/21/2019   Procedure: TRANSESOPHAGEAL ECHOCARDIOGRAM (TEE);  Surgeon: Jerline Pain, MD;  Location: [facility] ENDOSCOPY;  Service: Cardiovascular;  Laterality: N/A;    Prior to Admission medications   Medication Sig Start Date End Date Taking? Authorizing Provider  acetaminophen (TYLENOL) 325 MG tablet Take 2 tablets (650 mg total) by mouth every 8 (eight) hours as needed for mild pain or fever. 03/31/19  Yes Hongalgi, Lenis Dickinson, MD  Amino Acids-Protein Hydrolys (FEEDING SUPPLEMENT, PRO-STAT SUGAR FREE 64,) LIQD Take 30 mLs by mouth 2 (two) times daily. Patient taking differently: Take 30 mLs by mouth daily as needed (For Protein).  03/31/19  Yes Hongalgi, Lenis Dickinson, MD  Ascorbic Acid (VITAMIN C) 1000 MG tablet Take 2,000 mg by mouth  daily.     Yes [provider]  aspirin 81 MG tablet Take 81 mg by mouth daily.   Yes [provider]  benzonatate (TESSALON) 100 MG capsule Take 1 capsule (100 mg total) by mouth 2 (two) times daily as needed for cough. 04/22/19  Yes [name], MD  Camphor-Eucalyptus-Menthol (VICKS VAPORUB EX) Apply 1 application topically daily as needed (For congestion).   Yes [provider]  cephALEXin (KEFLEX) 500 MG capsule Take 500 mg by mouth 4 (four) times daily. Take for 10 days   Yes [provider]  diphenhydramine-acetaminophen (TYLENOL PM) 25-500 MG TABS tablet Take 2 tablets by mouth at bedtime as needed (for sleep).   Yes  [provider]  Ensure Plus (ENSURE PLUS) LIQD Take 237 mLs by mouth 2 (two) times daily between meals.   Yes [provider]  ferrous sulfate 325 (65 FE) MG tablet Take 325 mg by mouth daily with breakfast.   Yes [provider]  fish oil-omega-3 fatty acids 1000 MG capsule Take 4 g by mouth every morning.    Yes [provider]  HYDROcodone-acetaminophen (NORCO/VICODIN) 5-325 MG tablet Take 1 tablet by mouth every 8 (eight) hours as needed for moderate pain or severe pain. 04/09/19  Yes [name], MD  Magnesium 250 MG TABS Take 500-750 mg by mouth See admin instructions. 750 mg in the morning, 500 mg in the evening   Yes [provider]  Multiple Vitamin (MULTIVITAMIN) tablet Take 1 tablet by mouth daily.     Yes [provider]  omeprazole (PRILOSEC) 20 MG capsule Take 1 capsule (20 mg total) by mouth daily. 04/08/19  Yes [name], MD  potassium chloride (KLOR-CON) 10 MEQ tablet Take 2 tablets (20 mEq total) by mouth 2 (two) times daily. Patient taking differently: Take 30 mEq by mouth 2 (two) times daily.  04/21/19  Yes Baldwin Jamaica, PA-C  torsemide (DEMADEX) 20 MG tablet Take 3 tablets (60 mg total) by mouth 2 (two) times daily. Take an extra tablet as needed  04/21/19  Yes [name], MD  warfarin (COUMADIN) 10 MG tablet Take 10-15 mg by mouth See admin instructions. Takes 10 mg po QD; extra 5 mg on Sunday and Wednesday    Yes Banks, Shannon R, MD  Wheat Dextrin (BENEFIBER) CHEW Chew 3 tablets by mouth daily.   Yes [provider]  Ensure Max Protein (ENSURE MAX PROTEIN) LIQD Take 330 mLs (11 oz total) by mouth daily. Patient not taking: Reported on 05/14/2019 04/01/19   Hongalgi, Anand D, MD  JANTOVEN 10 MG tablet Take 0.5 tablets (5 mg total) by mouth daily at 6 PM. Take above dose until follow up with your Coumadin clinic then follow their instructions for dosing. Patient not taking: Reported on 05/14/2019 03/31/19   Hongalgi, Anand D, MD  enoxaparin (LOVENOX) 150 MG/ML injection Inject 1 mL (150 mg total) into the skin every 12 (twelve) hours. 02/20/19 03/16/19  Klein, Steven C, MD  fondaparinux (ARIXTRA) 10 MG/0.8ML SOLN injection Inject 0.8 mLs (10 mg total) into the skin daily. 02/20/19 03/16/19  Klein, Steven C, MD    Scheduled Meds: . sodium chloride   Intravenous Once  . acetaminophen  1,000 mg Oral Q8H  . amoxicillin  500 mg Oral Q8H  . ferrous sulfate  325 mg Oral Q breakfast  . magnesium oxide  400 mg Oral QPM  . magnesium oxide  600 mg Oral Daily  . mouth rinse  15 mL Mouth Rinse BID  . pantoprazole (PROTONIX) IV  40 mg Intravenous Q12H  . Warfarin - Pharmacist Dosing Inpatient   Does not apply q1800   Infusions: . furosemide (LASIX) infusion 10 mg/hr (05/15/19 0623)   PRN Meds: lip balm, oxyCODONE   Allergies as of 05/13/2019 - Review Complete 05/13/2019  Allergen Reaction Noted  . Warfarin and related Other (See Comments) 01/17/2017    Family History  Problem Relation Age of Onset  . Heart disease Sister   . Uterine cancer Sister   . Obesity Sister        50 0+ lbs  . Heart disease Brother   . Heart disease Sister   . Heart disease  Father   . Colon cancer Father 82  . Crohn's disease Paternal Grandmother       Social History   Socioeconomic History  . Marital status: Married    Spouse name: Not on file  . Number of children: 1  . Years of education: Not on file  . Highest education level: Not on file  Occupational History  . Occupation: Corporate investment banker, retired    Fish farm manager: [employer]  Tobacco Use  . Smoking status: Former Smoker    Years: 10.00    Types: Cigarettes, Pipe, Cigars    Quit date: 04/10/1977    Years since quitting: 42.1  . Smokeless tobacco: Never Used  Substance and Sexual Activity  . Alcohol use: Yes    Alcohol/week: 2.0 standard drinks    Types: 1 Glasses of wine, 1 Cans of beer per week  . Drug use: No  . Sexual activity: Not Currently  Other Topics Concern  . Not on file  Social History Narrative   Married, one adopted son and one daughter. 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   2 caffeinated beverages daily   Social Determinants of Health   Financial Resource Strain:   . Difficulty of Paying Living Expenses: Not on file  Food Insecurity: No Food Insecurity  . Worried About Charity fundraiser in the Last Year: Never true  . Ran Out of Food in the Last Year: Never true  Transportation Needs: No Transportation Needs  . Lack of Transportation (Medical): No  . Lack of Transportation (Non-Medical): No  Physical Activity:   . Days of Exercise per Week: Not on file  . Minutes of Exercise per Session: Not on file  Stress:   . Feeling of Stress : Not on file  Social Connections:   . Frequency of Communication with Friends and Family: Not on file  . Frequency of Social Gatherings with Friends and Family: Not on file  . Attends Religious Services: Not on file  . Active Member of Clubs or Organizations: Not on file  . Attends Archivist Meetings: Not on file  . Marital Status: Not on file  Intimate Partner Violence:   . Fear of Current or Ex-Partner: Not on file  . Emotionally Abused: Not on  file  . Physically Abused: Not on file  . Sexually Abused: Not on file    REVIEW OF SYSTEMS: Constitutional: Weakness, debilitated. ENT:  No nose bleeds Pulm: See HPI. CV:  No palpitations, no LE edema.  No angina. GU:  No hematuria, no frequency GI: No dysphagia.  See HPI. Heme: See HPI. Transfusions: See HPI. Neuro:  No headaches, no peripheral tingling or numbness.  No dizziness, no syncope. Derm: Decades of lower extremity edema.  More recent problems with cellulitis as per HPI. Endocrine:  No sweats or chills.  No polyuria or dysuria Immunization: Not queried. Travel:  None beyond local counties in last few months.    PHYSICAL EXAM: Vital signs in last 24 hours: Vitals:   05/15/19 0130 05/15/19 0454  BP: 112/85 131/69  Pulse:  68  Resp: 19 16  Temp: 97.9 F (36.6 C) 98 F (36.7 C)  SpO2: 96% 91%   Wt Readings from Last 3 Encounters:  05/15/19 (!) 155.6 kg  05/02/19 (!) 151.5 kg  04/14/19 (!) 148.8 kg   General: Morbidly obese, looks chronically ill but alert, comfortable. Head: No facial asymmetry or swelling.  No signs of  head trauma. Eyes: No scleral icterus, no conjunctival pallor.  EOMI. Ears: Not hard of hearing Nose: No congestion or discharge Mouth: Oral mucosa pink, moist, clear.  Good dentition.  Tongue midline Neck: Obese.  No JVD or thyromegaly appreciated Lungs: No labored breathing while sitting at the side of the bed.  No cough.  Lungs clear bilaterally. Heart: RRR with mechanical 2/6 systolic murmur.  S1, S2 present. Abdomen: Obese.  Not tender.  No HSM, masses, bruits, hernias..   Rectal: Did not perform rectal exam Musc/Skeltl: No joint redness or swelling. Extremities: Right >> L lower extremity swelling.  Redness more pronounced in the skin on left leg.  Both legs were Ace wrapped. Neurologic: Alert.  Oriented x3.  No tremor, no gross weakness. Skin: Lower extremity swelling and cellulitis looking changes on the left leg Few scattered  telangiectasia across the upper trunk and upper back. Tattoos: None observed Nodes: No cervical adenopathy Psych: Calm, pleasant, cooperative.  Affect WNL  Intake/Output from previous day: 02/03 0701 - 02/04 0700 In: 1292.9 [P.O.:360; I.V.:236.3; Blood:696.6] Out: 750 [Urine:750] Intake/Output this shift: No intake/output data recorded.  LAB RESULTS: Recent Labs    05/14/19 0435 05/14/19 0435 05/14/19 0819 05/15/19 0559 05/15/19 1109  WBC 8.2  --  7.7 8.1  --   HGB 6.5*   < > 6.4* 7.2* 7.2*  HCT 21.4*   < > 21.9* 23.6* 23.7*  PLT 266  --  257 256  --    < > = values in this interval not displayed.   BMET Lab Results  Component Value Date   NA 131 (L) 05/15/2019   NA 129 (L) 05/14/2019   NA 132 (L) 05/14/2019   K 5.5 (H) 05/15/2019   K 6.0 (H) 05/14/2019   K 6.4 (HH) 05/14/2019   CL 91 (L) 05/15/2019   CL 90 (L) 05/14/2019   CL 91 (L) 05/14/2019   CO2 29 05/15/2019   CO2 29 05/14/2019   CO2 29 05/14/2019   GLUCOSE 125 (H) 05/15/2019   GLUCOSE 100 (H) 05/14/2019   GLUCOSE 94 05/14/2019   BUN 98 (H) 05/15/2019   BUN 93 (H) 05/14/2019   BUN 89 (H) 05/14/2019   CREATININE 2.91 (H) 05/15/2019   CREATININE 2.76 (H) 05/14/2019   CREATININE 2.77 (H) 05/14/2019   CALCIUM 8.7 (L) 05/15/2019   CALCIUM 8.6 (L) 05/14/2019   CALCIUM 8.8 (L) 05/14/2019   LFT Recent Labs    05/14/19 0435 05/14/19 1210 05/15/19 0559  PROT 7.2  --   --   ALBUMIN 2.5* 2.4* 2.5*  AST 40  --   --   ALT 15  --   --   ALKPHOS 140*  --   --   BILITOT 1.4*  --   --    PT/INR Lab Results  Component Value Date   INR 6.4 (HH) 05/15/2019   INR 6.6 (HH) 05/14/2019   INR 7.6 (HH) 05/14/2019   Hepatitis Panel No results for input(s): HEPBSAG, HCVAB, HEPAIGM, HEPBIGM in the last 72 hours. C-Diff No components found for: CDIFF Lipase  No results found for: LIPASE  Drugs of Abuse  No results found for: LABOPIA, COCAINSCRNUR, LABBENZ, AMPHETMU, THCU, LABBARB   RADIOLOGY STUDIES: DG  Chest 2 View  Result Date: 05/13/2019 CLINICAL DATA:  Shortness of breath EXAM: CHEST - 2 VIEW COMPARISON:  March 16, 2019 FINDINGS: There is a dual lead AICD. Multiple sternal wires are present. Mild, chronic appearing increased interstitial lung markings are seen. Very mild  atelectasis is noted within the bilateral lung bases. There is no evidence of a pleural effusion or pneumothorax. The cardiac silhouette is markedly enlarged. Multilevel degenerative changes seen throughout the thoracic spine. IMPRESSION: 1. Marked cardiomegaly. 2. Mild, chronic appearing increased interstitial lung markings. A mild superimposed component of interstitial edema cannot be excluded. 3. Mild bibasilar atelectasis. Electronically Signed   By: Virgina Norfolk M.D.   On: 05/13/2019 18:52   ECHOCARDIOGRAM COMPLETE  Result Date: 05/15/2019   ECHOCARDIOGRAM REPORT   Patient Name:   RODGER GIANGREGORIO Beshears Date of Exam: 05/15/2019 Medical Rec #:  [id number]       Height:       71.0 in Accession #:    [id number]      Weight:       343.0 lb Date of Birth:  [birth year]      BSA:          2.65 m Patient Age:    41 years        BP:           131/69 mmHg Patient Gender: M               HR:           60 bpm. 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 CHF and Hypertrophic Cardiomyopathy,                 Pacemaker, Arrythmias:Atrial Fibrillation; Risk                 Factors:Hypertension.  Sonographer:    Mikki Santee RDCS (AE) Referring Phys: [phone] [facility]  Sonographer Comments: Image acquisition challenging due to patient body habitus. IMPRESSIONS  1. Left ventricular ejection fraction, by visual estimation, is 55 to 60%. The left ventricle has normal function. Left ventricular septal wall thickness was mildly increased. There is no left  ventricular hypertrophy.  2. The left ventricle has no regional wall motion abnormalities.  3. 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  4. Global right ventricle has moderately reduced systolic function.The right ventricular size is moderately enlarged. No increase in right ventricular wall thickness.  5. Left atrial size was moderately dilated.  6. Right atrial size was severely dilated.  7. The mitral valve has been repaired/replaced. No evidence of mitral valve regurgitation.  8. Normal appearing mechanical MVR Compared to TTE done 03/19/19 mean gradient gone from 5-6.5 mmHg with MVA 2.4-> 2.1 cm2 by PT1/2.  9. The tricuspid valve is degenerative. 10. The tricuspid valve is degenerative. 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 11. Pacing wire across TV seems redundant and possibly interferes with TV This and previous lead removal likely contributes to patients TR and right heart failure. 12. Aortic valve regurgitation is trivial. Mild aortic valve stenosis. 13. Historically the AV has been repaired after surgery for HOCM/Ablation damaged valve. The right and non coronary cusps are thickened/calcified and may have been plicated Compared to TTE done 03/19/19 mean gradient gone from 9-> 11 mmHg and peak from 16  to 22 mmHg. 14. Pulmonic regurgitation is mild. 15. The pulmonic valve was not well visualized. Pulmonic valve regurgitation is mild. 16. Mildly elevated pulmonary artery systolic pressure. 17. The interatrial septum  was not well visualized. FINDINGS  Left Ventricle: Left ventricular ejection fraction, by visual estimation, is 55 to 60%. The left ventricle has normal function. The left ventricle has no regional wall motion abnormalities. The left ventricular internal cavity size was the left ventricle is normal in size. There is no left ventricular hypertrophy. 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 Right Ventricle: The right ventricular size is  moderately enlarged. No increase in right ventricular wall thickness. Global RV systolic function is has moderately reduced systolic function. The tricuspid regurgitant velocity is 2.45 m/s, and with an assumed right atrial pressure of 15 mmHg, the estimated right ventricular systolic pressure is mildly elevated at 39.0 mmHg. Left Atrium: Left atrial size was moderately dilated. Right Atrium: Right atrial size was severely dilated Pericardium: There is no evidence of pericardial effusion. Mitral Valve: The mitral valve has been repaired/replaced. No evidence of mitral valve regurgitation. MV peak gradient, 18.7 mmHg. Normal appearing mechanical MVR Compared to TTE done 03/19/19 mean gradient gone from 5-6.5 mmHg with MVA 2.4-> 2.1 cm2 by PT1/2. Tricuspid Valve: The tricuspid valve is degenerative in appearance. 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 Pacing wire across TV seems redundant and possibly interferes with TV This and previous lead removal likely contributes to patients TR and right heart failure. Aortic Valve: The aortic valve has been repaired/replaced. Aortic valve regurgitation is trivial. Mild aortic stenosis is present. Aortic valve mean gradient measures 11.0 mmHg. Aortic valve peak gradient measures 22.3 mmHg. Aortic valve area, by VTI measures 1.69 cm. Historically the AV has been repaired after surgery for HOCM/Ablation damaged valve. The right and non coronary cusps are thickened/calcified and may have been plicated Compared to TTE done 03/19/19 mean gradient gone from 9-> 11 mmHg and peak from 16 to 22 mmHg. Pulmonic Valve: The pulmonic valve was not well visualized. Pulmonic valve regurgitation is mild. Pulmonic regurgitation is mild. Aorta: The aortic root is normal in size and structure. IAS/Shunts: The interatrial septum was not well visualized.  LEFT VENTRICLE PLAX 2D LVOT diam:     2.40 cm LVOT Area:     4.52 cm  RIGHT VENTRICLE RV S prime:     9.90 cm/s TAPSE (M-mode): 1.8 cm  LEFT ATRIUM              Index       RIGHT ATRIUM           Index LA Vol (A2C):   151.0 ml 56.94 ml/m RA Area:     30.90 cm LA Vol (A4C):   137.0 ml 51.66 ml/m RA Volume:   109.00 ml 41.10 ml/m LA Biplane Vol: 148.0 ml 55.81 ml/m  AORTIC VALVE AV Area (Vmax):    1.47 cm AV Area (Vmean):   1.57 cm AV Area (VTI):     1.69 cm AV Vmax:           236.33 cm/s AV Vmean:          153.000 cm/s AV VTI:            0.507 m AV Peak Grad:      22.3 mmHg AV Mean Grad:      11.0 mmHg LVOT Vmax:         76.70 cm/s LVOT Vmean:        53.100 cm/s LVOT VTI:          0.190 m LVOT/AV VTI ratio: 0.37 MITRAL VALVE  TRICUSPID VALVE MV Area (PHT): 2.13 cm              TR Peak grad:   24.0 mmHg MV Peak grad:  18.7 mmHg             TR Vmax:        254.00 cm/s MV Mean grad:  6.5 mmHg MV Vmax:       2.16 m/s              SHUNTS MV Vmean:      109.5 cm/s            Systemic VTI:  0.19 m MV VTI:        0.61 m                Systemic Diam: 2.40 cm MV PHT:        103.24 msec MV Decel Time: 356 msec MV E velocity: 172.00 cm/s 103 cm/s MV A velocity: 60.60 cm/s  70.3 cm/s MV E/A ratio:  2.84        1.5  Jenkins Rouge MD Electronically signed by Jenkins Rouge MD Signature Date/Time: 05/15/2019/12:48:20 PM    Final       IMPRESSION:   *    Acute on chronic anemia.  Despite daily oral iron for the last 2 to 3 months.  In setting of iatrogenic coagulopathy.  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  INR supratherapeutic.  Has received 2 doses oral vitamin K.  Coumadin on hold.  *    AKI.  CKD stage III at baseline.  Per kalemia.  *    CHF.  Acute on chronic diastolic CHF. Hypertrophic cardiomyopathy.  Pacemaker in situ. Echocardiogram performed today, results pending.  *    Recurrent lower extremity cellulitis.  Initial incident was associated with strep G sepsis in early 03/2019.    *    Hx recurrent adenomatous and assess colon polyps.  Colonoscopy in 02/2019 anemia and bleeding per rectum  showed hemorrhoids, diverticulosis but no recurrent polyps.   PLAN:     *  EGD when felt stable??  Dr Rush Landmark will attend pt for GI needs.  Dose Feraheme ordered.     Azucena Freed  05/15/2019, 1:12 PM Phone [phone]

## 2019-05-15 NOTE — Progress Notes (Signed)
ANTICOAGULATION CONSULT NOTE   Pharmacy Consult for Coumadin Indication: Atrial fibrillation and mechanical heart valve  Allergies  Allergen Reactions  . Warfarin And Related Other (See Comments)    COUMADIN or JANTOVEN BRAND ONLY** Per pt, was switched to generic in 2001 and had significantly decreased absorption > INR subtherapeutic> pt suffered a TIA and has been on brand name only since     Patient Measurements: Height: 5\' 11"  (180.3 cm) Weight: (!) 343 lb (155.6 kg) IBW/kg (Calculated) : 75.3  Vital Signs: Temp: 97.5 F (36.4 C) (02/04 1359) Temp Source: Oral (02/04 1359) BP: 135/59 (02/04 1359) Pulse Rate: 60 (02/04 1359)  Labs: Recent Labs    05/13/19 1930 05/13/19 2133 05/13/19 2133 05/14/19 0435 05/14/19 0435 05/14/19 0819 05/14/19 0819 05/14/19 1210 05/14/19 1428 05/15/19 0559 05/15/19 1109  HGB  --   --    < > 6.5*   < > 6.4*   < >  --   --  7.2* 7.2*  HCT  --   --    < > 21.4*   < > 21.9*  --   --   --  23.6* 23.7*  PLT  --   --    < > 266  --  257  --   --   --  256  --   LABPROT  --  61.8*   < > 64.5*  --   --   --   --  57.7* 56.8*  --   INR  --  7.2*   < > 7.6*  --   --   --   --  6.6* 6.4*  --   CREATININE  --   --    < > 2.77*  --   --   --  2.76*  --  2.91*  --   TROPONINIHS 53* 58*  --   --   --   --   --   --   --   --   --    < > = values in this interval not displayed.    Estimated Creatinine Clearance: 35.4 mL/min (A) (by C-G formula based on SCr of 2.91 mg/dL (H)).   Medical History: Past Medical History:  Diagnosis Date  . Atrial arrhythmia    status post ablation in [place] with complication including damage to the aortic valve  . Atrial fibrillation ([place])   . AV block, 1st degree   . Bradycardia   . Cluster headache ~ 1980   "went away after I started taking one of the heart RXs" (06/29/2017)  . Diastolic CHF, chronic ([place])   . Diverticulosis   . History of gout   . HTN (hypertension)   . 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 Infection   . Internal hemorrhoids   . Morbid obesity ([place])   . Morbid obesity with BMI of 45.0-49.9, adult ([place])   . Pacemaker  -SJM   . Skin cancer    "burned off RUE X 1; cut off nose X 2" (06/29/2017)  . TIA (transient ischemic attack) 2001  . Tubulovillous adenoma     Medications:  No current facility-administered medications on file prior to encounter.   Current Outpatient Medications on File Prior to Encounter  Medication Sig Dispense Refill  . acetaminophen (TYLENOL) 325 MG tablet Take 2 tablets (650 mg total) by mouth every 8 (eight) hours as needed for mild pain or fever.    . Amino Acids-Protein Hydrolys (FEEDING SUPPLEMENT,  PRO-STAT SUGAR FREE 64,) LIQD Take 30 mLs by mouth 2 (two) times daily. (Patient taking differently: Take 30 mLs by mouth daily as needed (For Protein). ) 887 mL 0  . Ascorbic Acid (VITAMIN C) 1000 MG tablet Take 2,000 mg by mouth daily.      Marland Kitchen aspirin 81 MG tablet Take 81 mg by mouth daily.    . benzonatate (TESSALON) 100 MG capsule Take 1 capsule (100 mg total) by mouth 2 (two) times daily as needed for cough. 20 capsule 0  . Camphor-Eucalyptus-Menthol (VICKS VAPORUB EX) Apply 1 application topically daily as needed (For congestion).    . cephALEXin (KEFLEX) 500 MG capsule Take 500 mg by mouth 4 (four) times daily. Take for 10 days    . diphenhydramine-acetaminophen (TYLENOL PM) 25-500 MG TABS tablet Take 2 tablets by mouth at bedtime as needed (for sleep).    . Ensure Plus (ENSURE PLUS) LIQD Take 237 mLs by mouth 2 (two) times daily between meals.    . ferrous sulfate 325 (65 FE) MG tablet Take 325 mg by mouth daily with breakfast.    . fish oil-omega-3 fatty acids 1000 MG capsule Take 4 g by mouth every morning.     Marland Kitchen HYDROcodone-acetaminophen (NORCO/VICODIN) 5-325 MG tablet Take 1 tablet by mouth every 8 (eight) hours as needed for moderate pain or severe pain. 24 tablet 0  . Magnesium 250 MG TABS Take 500-750 mg by mouth See admin  instructions. 750 mg in the morning, 500 mg in the evening    . Multiple Vitamin (MULTIVITAMIN) tablet Take 1 tablet by mouth daily.      Marland Kitchen omeprazole (PRILOSEC) 20 MG capsule Take 1 capsule (20 mg total) by mouth daily. 90 capsule 1  . potassium chloride (KLOR-CON) 10 MEQ tablet Take 2 tablets (20 mEq total) by mouth 2 (two) times daily. (Patient taking differently: Take 30 mEq by mouth 2 (two) times daily. )    . torsemide (DEMADEX) 20 MG tablet Take 3 tablets (60 mg total) by mouth 2 (two) times daily. Take an extra tablet as needed 560 tablet 3  . warfarin (COUMADIN) 10 MG tablet Take 10-15 mg by mouth See admin instructions. Takes 10 mg po QD; extra 5 mg on Sunday and Wednesday     . Wheat Dextrin (BENEFIBER) CHEW Chew 3 tablets by mouth daily.    . Ensure Max Protein (ENSURE MAX PROTEIN) LIQD Take 330 mLs (11 oz total) by mouth daily. (Patient not taking: Reported on 05/14/2019) 330 mL 12  . JANTOVEN 10 MG tablet Take 0.5 tablets (5 mg total) by mouth daily at 6 PM. Take above dose until follow up with your Coumadin clinic then follow their instructions for dosing. (Patient not taking: Reported on 05/14/2019) 30 tablet 0  . [DISCONTINUED] enoxaparin (LOVENOX) 150 MG/ML injection Inject 1 mL (150 mg total) into the skin every 12 (twelve) hours. 7 mL 0  . [DISCONTINUED] fondaparinux (ARIXTRA) 10 MG/0.8ML SOLN injection Inject 0.8 mLs (10 mg total) into the skin daily. 4 mL 1     Assessment: 74 y.o. male admitted with CHF, h/o Afib and MVR, to continue Coumadin . INR today continues to be supratherapeutic at 6.4, hgb low but stable at 7.2.    Goal of Therapy:  INR 2.5-3.5 Monitor platelets by anticoagulation protocol: Yes   Plan:  No Coumadin today Vitamin k 2.5mg  ordered this am F/U daily INR  Erin Hearing PharmD., BCPS Clinical Pharmacist 05/15/2019 2:36 PM

## 2019-05-15 NOTE — Evaluation (Signed)
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  Pt with cellulitis of R LE, CHF, and questionable infection.   Clinical Impression   Pt admitted with the above diagnosis and has the deficits outlined below. Pt would benefit from cont OT to increase independence with LE adls and adl mobility to take the burden off his wife to assist with adls. Pt moves well given extra time.  Pt has had several admissions in last few months.  Pt may benefit from inpatient rehab, if agreeable, to attempt to increase strength and endurance enough to increase independence in adls to S to mod I level.  Wife does work and pt is home alone at times.  Unusure if pt would be agreeable to this plan but feel he would benefit most from this type of rehab.  If pt refuses, pt will need HHOT.  Do not feel pt would be safe at home alone at all at this time.      Follow Up Recommendations  CIR    Equipment Recommendations  (tbd)    Recommendations for Other Services Rehab consult     Precautions / Restrictions Precautions Precautions: Fall Restrictions Weight Bearing Restrictions: No      Mobility Bed Mobility Overal bed mobility: Needs Assistance Bed Mobility: Supine to Sit;Sit to Supine     Supine to sit: Min assist;+2 for physical assistance Sit to supine: +2 for physical assistance;Mod assist   General bed mobility comments: pt takes a lot of time to mobilize but evenutally can mobilize well given this time. Uses bed height adjustments and rails to assist in bed mobility.  Would not mobilze as well without these.  Primarily  uses lift chair at home.  Transfers Overall transfer level: Needs assistance Equipment used: 2 person hand held assist;Rolling walker (2 wheeled) Transfers: Sit to/from [facility] Sit to Stand: Min assist Stand pivot transfers: Min assist;+2 physical assistance       General transfer comment: Pt utilized walker and needed cues for hand placment and safety.    Balance Overall balance assessment: Needs assistance Sitting-balance support: Feet supported Sitting balance-Leahy Scale: Good     Standing balance support: Bilateral upper extremity supported;During functional activity Standing balance-Leahy Scale: Poor Standing balance comment: Pt requires outside assist to remain standing.                           ADL either performed or assessed with clinical judgement   ADL Overall ADL's : Needs assistance/impaired Eating/Feeding: Set up;Sitting   Grooming: Set up;Sitting   Upper Body Bathing: Set up;Sitting   Lower Body Bathing: Moderate assistance;Sit to/from stand   Upper Body Dressing : Minimal assistance;Sitting   Lower Body Dressing: Maximal assistance;Sit to/from stand   Toilet Transfer: Minimal assistance;RW;BSC;Stand-pivot   Toileting- Clothing Manipulation and Hygiene: Minimal assistance;Sit to/from stand;Cueing for compensatory techniques       Functional mobility during ADLs: Minimal assistance;Rolling walker General ADL Comments: Pt does well with UE adls.  Needs assist with LE adls.     Vision Baseline Vision/History: No visual deficits Patient Visual Report: No change from baseline Vision  Assessment?: No apparent visual deficits     Perception Perception Perception Tested?: No   Praxis Praxis Praxis tested?: Within functional limits    Pertinent Vitals/Pain Pain Assessment: Faces Faces Pain Scale: Hurts little more Pain Location: leg, catheter area when moving because of catheter cap(resolved after moving) and low  back. Pain Descriptors / Indicators: Aching Pain Intervention(s): Monitored during session;Repositioned     Hand Dominance Right   Extremity/Trunk Assessment Upper Extremity Assessment Upper Extremity Assessment: Overall WFL for tasks assessed   Lower Extremity Assessment Lower Extremity Assessment: Defer to PT evaluation       Communication Communication Communication: No difficulties   Cognition Arousal/Alertness: Awake/alert Behavior During Therapy: WFL for tasks assessed/performed Overall Cognitive Status: Within Functional Limits for tasks assessed                                 General Comments: baseline   General Comments  Pt with edemoutous skin in BLEs R more than left.  Removed plastic tape from leg to secure catheter and replaced with paper tape.    Exercises     Shoulder Instructions      Home Living Family/patient expects to be discharged to:: Private residence Living Arrangements: Spouse/significant other Available Help at Discharge: Family Type of Home: House Home Access: Stairs to enter Technical brewer of Steps: 7 Entrance Stairs-Rails: Left Home Layout: One level     Bathroom Shower/Tub: Teacher, early years/pre: [place]: Environmental consultant - 2 wheels;Other (comment)(lift chair)   Additional Comments: Pt has lift chair he sleeps in most of the time.      Prior Functioning/Environment Level of Independence: Needs assistance  Gait / Transfers Assistance Needed: walks in home with walker, gets assist with some adls depending on the day.  Did have issues getting back into home when d/c'd home in Dec.  7 steps difficult to manage. ADL's / Homemaking Assistance Needed: wife does all homemaking and occasionally assists with adls. Pt does bed bathes or sponge baths. Does not get into shower.            OT Problem List: Decreased strength;Decreased activity tolerance;Impaired balance (sitting and/or  standing);Decreased knowledge of use of DME or AE;Pain      OT Treatment/Interventions: Self-care/ADL training;Therapeutic activities;Balance training;DME and/or AE instruction    OT Goals(Current goals can be found in the care plan section) Acute Rehab OT Goals Patient Stated Goal: to feel stronger and to sleep OT Goal Formulation: With patient Time For Goal Achievement: 05/29/19 Potential to Achieve Goals: Good ADL Goals Pt Will Perform Grooming: with supervision;standing Pt Will Perform Lower Body Bathing: with supervision;with adaptive equipment;sit to/from stand Pt Will Perform Lower Body Dressing: with min assist;with adaptive equipment;sit to/from stand Pt Will Transfer to Toilet: with supervision;ambulating  OT Frequency: Min 2X/week   Barriers to D/C:    wife can help.  Unsure if available 24/7.       Co-evaluation              AM-PAC OT "6 Clicks" Daily Activity     Outcome Measure Help from another person eating meals?: None Help from another person taking care of personal grooming?: A Little Help from another person toileting, which includes using toliet, bedpan, or urinal?: A Lot Help from another person bathing (including washing, rinsing, drying)?: A Lot Help from another person to put on and taking off regular upper  body clothing?: A Little Help from another person to put on and taking off regular lower body clothing?: A Lot 6 Click Score: 16   End of Session Equipment Utilized During Treatment: Surveyor, mining Communication: Mobility status  Activity Tolerance: Patient limited by fatigue Patient left: in bed;with family/visitor present;with call bell/phone within reach  OT Visit Diagnosis: Unsteadiness on feet (R26.81)                Time: 1002-1040 OT Time Calculation (min): 38 min Charges:  OT General Charges $OT Visit: 1 Visit OT Evaluation $OT Eval Moderate Complexity: 1 Mod  Glenford Peers 05/15/2019, 11:00 AM

## 2019-05-16 ENCOUNTER — Inpatient Hospital Stay: Payer: Self-pay

## 2019-05-16 DIAGNOSIS — Z7901 Long term (current) use of anticoagulants: Secondary | ICD-10-CM

## 2019-05-16 DIAGNOSIS — I50811 Acute right heart failure: Secondary | ICD-10-CM

## 2019-05-16 DIAGNOSIS — D649 Anemia, unspecified: Secondary | ICD-10-CM

## 2019-05-16 DIAGNOSIS — R195 Other fecal abnormalities: Secondary | ICD-10-CM

## 2019-05-16 LAB — HEMOGLOBIN AND HEMATOCRIT, BLOOD
HCT: 23.1 % — ABNORMAL LOW (ref 39.0–52.0)
HCT: 22.8 % — ABNORMAL LOW (ref 39.0–52.0)
Hemoglobin: 7.1 g/dL — ABNORMAL LOW (ref 13.0–17.0)
Hemoglobin: 6.9 g/dL — CL (ref 13.0–17.0)

## 2019-05-16 LAB — RENAL FUNCTION PANEL
Albumin: 2.4 g/dL — ABNORMAL LOW (ref 3.5–5.0)
Anion gap: 16 — ABNORMAL HIGH (ref 5–15)
BUN: 100 mg/dL — ABNORMAL HIGH (ref 8–23)
CO2: 29 mmol/L (ref 22–32)
Calcium: 8.8 mg/dL — ABNORMAL LOW (ref 8.9–10.3)
Chloride: 87 mmol/L — ABNORMAL LOW (ref 98–111)
Creatinine, Ser: 2.84 mg/dL — ABNORMAL HIGH (ref 0.61–1.24)
GFR calc Af Amer: 25 mL/min — ABNORMAL LOW (ref >60)
GFR calc non Af Amer: 21 mL/min — ABNORMAL LOW (ref >60)
Glucose, Bld: 113 mg/dL — ABNORMAL HIGH (ref 70–99)
Phosphorus: 5.3 mg/dL — ABNORMAL HIGH (ref 2.5–4.6)
Potassium: 4.9 mmol/L (ref 3.5–5.1)
Sodium: 132 mmol/L — ABNORMAL LOW (ref 135–145)

## 2019-05-16 LAB — PROTIME-INR
INR: 4.8 (ref 0.8–1.2)
Prothrombin Time: 44.7 seconds — ABNORMAL HIGH (ref 11.4–15.2)

## 2019-05-16 LAB — MAGNESIUM: Magnesium: 3 mg/dL — ABNORMAL HIGH (ref 1.7–2.4)

## 2019-05-16 MED ORDER — PANTOPRAZOLE SODIUM 40 MG PO TBEC
40.0000 mg | DELAYED_RELEASE_TABLET | Freq: Two times a day (BID) | ORAL | Status: DC
  Administered 2019-05-16 – 2019-05-26 (×21): 40 mg via ORAL
  Filled 2019-05-16 (×21): qty 1

## 2019-05-16 MED ORDER — SODIUM CHLORIDE 0.9% FLUSH
10.0000 mL | INTRAVENOUS | Status: DC | PRN
  Administered 2019-05-19: 23:00:00 10 mL

## 2019-05-16 MED ORDER — PANTOPRAZOLE SODIUM 40 MG PO TBEC: 40.0000 mg | DELAYED_RELEASE_TABLET | Freq: Every day | ORAL | Status: DC

## 2019-05-16 MED ORDER — MILRINONE LACTATE IN DEXTROSE 20-5 MG/100ML-% IV SOLN
0.1250 ug/kg/min | INTRAVENOUS | Status: DC
  Administered 2019-05-16 – 2019-05-23 (×10): 0.125 ug/kg/min via INTRAVENOUS
  Filled 2019-05-16 (×10): qty 100

## 2019-05-16 MED ORDER — SODIUM CHLORIDE 0.9% IV SOLUTION: Freq: Once | INTRAVENOUS | Status: DC

## 2019-05-16 NOTE — Progress Notes (Signed)
ANTICOAGULATION CONSULT NOTE   Pharmacy Consult for Coumadin Indication: Atrial fibrillation and mechanical heart valve  Allergies  Allergen Reactions  . Warfarin And Related Other (See Comments)    COUMADIN or JANTOVEN BRAND ONLY** Per pt, was switched to generic in 2001 and had significantly decreased absorption > INR subtherapeutic> pt suffered a TIA and has been on brand name only since     Patient Measurements: Height: 5\' 11"  (180.3 cm) Weight: (!) 345 lb 0.3 oz (156.5 kg) IBW/kg (Calculated) : 75.3  Vital Signs: Temp: 98.2 F (36.8 C) (02/05 0359) Temp Source: Oral (02/05 0359) BP: 141/71 (02/05 0839) Pulse Rate: 67 (02/05 0839)  Labs: Recent Labs    05/13/19 1930 05/13/19 2133 05/13/19 2133 05/14/19 0435 05/14/19 0435 05/14/19 0819 05/14/19 1210 05/15/19 0559 05/15/19 0559 05/15/19 1109 05/15/19 1109 05/15/19 1518 05/15/19 1910 05/16/19 0309  HGB  --   --    < > 6.5*   < > 6.4*   < > 7.2*   < > 7.2*   < >  --  7.4* 7.1*  HCT  --   --    < > 21.4*   < > 21.9*   < > 23.6*   < > 23.7*  --   --  24.2* 23.1*  PLT  --   --    < > 266  --  257  --  256  --   --   --   --   --   --   LABPROT  --  61.8*   < > 64.5*  --   --    < > 56.8*  --   --   --  58.1*  --  44.7*  INR  --  7.2*   < > 7.6*  --   --    < > 6.4*  --   --   --  6.6*  --  4.8*  CREATININE  --   --    < > 2.77*  --   --    < > 2.91*  --   --   --  2.89*  --  2.84*  TROPONINIHS 53* 58*  --   --   --   --   --   --   --   --   --   --   --   --    < > = values in this interval not displayed.    Estimated Creatinine Clearance: 36.4 mL/min (A) (by C-G formula based on SCr of 2.84 mg/dL (H)).   Medical History: Past Medical History:  Diagnosis Date  . Atrial arrhythmia    status post ablation in [place] with complication including damage to the aortic valve  . Atrial fibrillation ([place])   . AV block, 1st degree   . Bradycardia   . Cluster headache ~ 1980   "went away after I started taking one of  the heart RXs" (06/29/2017)  . Diastolic CHF, chronic ([place])   . Diverticulosis   . History of gout   . HTN (hypertension)   . 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 Infection   . Internal hemorrhoids   . Morbid obesity ([place])   . Morbid obesity with BMI of 45.0-49.9, adult ([place])   . Pacemaker  -SJM   . Skin cancer    "burned off RUE X 1; cut off nose X 2" (06/29/2017)  . TIA (transient ischemic attack) 2001  . Tubulovillous adenoma  Medications:  No current facility-administered medications on file prior to encounter.   Current Outpatient Medications on File Prior to Encounter  Medication Sig Dispense Refill  . acetaminophen (TYLENOL) 325 MG tablet Take 2 tablets (650 mg total) by mouth every 8 (eight) hours as needed for mild pain or fever.    . Amino Acids-Protein Hydrolys (FEEDING SUPPLEMENT, PRO-STAT SUGAR FREE 64,) LIQD Take 30 mLs by mouth 2 (two) times daily. (Patient taking differently: Take 30 mLs by mouth daily as needed (For Protein). ) 887 mL 0  . Ascorbic Acid (VITAMIN C) 1000 MG tablet Take 2,000 mg by mouth daily.      Marland Kitchen aspirin 81 MG tablet Take 81 mg by mouth daily.    . benzonatate (TESSALON) 100 MG capsule Take 1 capsule (100 mg total) by mouth 2 (two) times daily as needed for cough. 20 capsule 0  . Camphor-Eucalyptus-Menthol (VICKS VAPORUB EX) Apply 1 application topically daily as needed (For congestion).    . cephALEXin (KEFLEX) 500 MG capsule Take 500 mg by mouth 4 (four) times daily. Take for 10 days    . diphenhydramine-acetaminophen (TYLENOL PM) 25-500 MG TABS tablet Take 2 tablets by mouth at bedtime as needed (for sleep).    . Ensure Plus (ENSURE PLUS) LIQD Take 237 mLs by mouth 2 (two) times daily between meals.    . ferrous sulfate 325 (65 FE) MG tablet Take 325 mg by mouth daily with breakfast.    . fish oil-omega-3 fatty acids 1000 MG capsule Take 4 g by mouth every morning.     Marland Kitchen HYDROcodone-acetaminophen (NORCO/VICODIN) 5-325 MG  tablet Take 1 tablet by mouth every 8 (eight) hours as needed for moderate pain or severe pain. 24 tablet 0  . Magnesium 250 MG TABS Take 500-750 mg by mouth See admin instructions. 750 mg in the morning, 500 mg in the evening    . Multiple Vitamin (MULTIVITAMIN) tablet Take 1 tablet by mouth daily.      Marland Kitchen omeprazole (PRILOSEC) 20 MG capsule Take 1 capsule (20 mg total) by mouth daily. 90 capsule 1  . potassium chloride (KLOR-CON) 10 MEQ tablet Take 2 tablets (20 mEq total) by mouth 2 (two) times daily. (Patient taking differently: Take 30 mEq by mouth 2 (two) times daily. )    . torsemide (DEMADEX) 20 MG tablet Take 3 tablets (60 mg total) by mouth 2 (two) times daily. Take an extra tablet as needed 560 tablet 3  . warfarin (COUMADIN) 10 MG tablet Take 10-15 mg by mouth See admin instructions. Takes 10 mg po QD; extra 5 mg on Sunday and Wednesday     . Wheat Dextrin (BENEFIBER) CHEW Chew 3 tablets by mouth daily.    . Ensure Max Protein (ENSURE MAX PROTEIN) LIQD Take 330 mLs (11 oz total) by mouth daily. (Patient not taking: Reported on 05/14/2019) 330 mL 12  . JANTOVEN 10 MG tablet Take 0.5 tablets (5 mg total) by mouth daily at 6 PM. Take above dose until follow up with your Coumadin clinic then follow their instructions for dosing. (Patient not taking: Reported on 05/14/2019) 30 tablet 0  . [DISCONTINUED] enoxaparin (LOVENOX) 150 MG/ML injection Inject 1 mL (150 mg total) into the skin every 12 (twelve) hours. 7 mL 0  . [DISCONTINUED] fondaparinux (ARIXTRA) 10 MG/0.8ML SOLN injection Inject 0.8 mLs (10 mg total) into the skin daily. 4 mL 1     Assessment: 74 y.o. male admitted with CHF, h/o Afib and MVR, to continue Coumadin.  INR today continues to be supratherapeutic but down to 4.8, hgb low but stable at 7.1.   With plans for possible RHC will continue to hold warfarin and will follow along for possible need of heparin bridge. It does appear that he received 7.5mg  of vitamin k yesterday so would  expect INR to continue to drop significantly overnight.    Goal of Therapy:  INR 2.5-3.5 Monitor platelets by anticoagulation protocol: Yes   Plan:  Continue to hold warfarin - will discuss with cardiology need for heparin bridge once INR drops into the 2-3 range if cath is planned next week  Erin Hearing PharmD., BCPS Clinical Pharmacist 05/16/2019 9:44 AM

## 2019-05-16 NOTE — Progress Notes (Signed)
PROGRESS NOTE  Frank [name] XTA:[id number] DOB: 1945-06-28   PCP: [name], MD  Patient is from: Home.  Lives with his wife.  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 Fib, lymphedema, CKD-4, debility, morbid obesity, recurrent hospitalization for sepsis secondary to Streptococcus from RLE cellulitis presenting with progressive dyspnea and weight gain.   In ED, hemodynamically stable except for brief tachycardia to 124.  Saturation 98% on room air. CXR consistent with CHF.  K7.0. Cr 2.67 (baseline~1.8).  INR 7.2.  Hgb 7.2 (baseline about 8.0).  EKG revealed paced rhythm.  COVID-19 negative.  Received IV Lasix 120 mg once and was started on Lasix drip by cardiologist.  Also received bicarb, IV insulin and D50, and admitted for CHF exacerbation, AKI, hyperkalemia, supratherapeutic INR and anemia.  Patient received additional dose of Lokelma 10 g and oral vitamin K 2.5 mg.   Subjective: No major events overnight or this morning.  Reports restless night and difficulty sleeping.  He is also sad that his wife is not visiting or staying more due visiting our limitations.  He denies chest pain, dyspnea or GI symptoms.   Objective: Vitals:   05/16/19 0359 05/16/19 0655 05/16/19 0839 05/16/19 1445  BP: 136/70  (!) 141/71 (!) 111/52  Pulse: 60  67 68  Resp: 14  14 16   Temp: 98.2 F (36.8 C)   97.7 F (36.5 C)  TempSrc: Oral   Oral  SpO2: 100%  99% 100%  Weight:  (!) 156.5 kg    Height:        Intake/Output Summary (Last 24 hours) at 05/16/2019 1518 Last data filed at 05/16/2019 1325 Gross per 24 hour  Intake 1447.52 ml  Output 2050 ml  Net -602.48 ml   Filed Weights   05/14/19 0654 05/15/19 0454 05/16/19 0655  Weight: (!) 157.4 kg (!) 155.6 kg (!) 156.5 kg    Examination:  GENERAL: No apparent distress.  Nontoxic. HEENT: MMM.  Vision and hearing grossly intact.  NECK:  Supple.  No apparent JVD but difficult exam due to body habitus.Marland Kitchen  RESP:  No IWOB. Good air movement bilaterally. CVS:  RRR.  2/6 SEM over RUSB and LUSB.   ABD/GI/GU: Bowel sounds present. Soft. Non tender.  MSK/EXT: Unna boots over bilateral lower extremities. SKIN: Unna boot over bilateral lower extremities. NEURO: Awake, alert and oriented appropriately.  No apparent focal neuro deficit. PSYCH: Calm. Normal affect.   Procedures:  None  Assessment & Plan: Acute on chronic diastolic CHF: Echo in 65/53 with EF of 55 to 60%, moderate LVH, moderate LAE and severe RAE, moderate to severe TVR.  Had significant fluid overload with massive lymphedema.  Presented with dyspnea and weight gain.  CXR concerning for CHF.  I and O incomplete but 1.3 L UOP overnight.  Renal function relatively stable. -On IV Lasix drip and p.o. Diamox per cardiology -Monitor fluid status, renal function and electrolytes -Sodium and fluid restrictions.  AKI on CKD-4/azotemia: Baseline  Cr 1.8-2.0 > 2.67 (admit) >> 2.84.  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  -Renal function relatively stable on IV Lasix drip. -Continue monitoring.  Acute on chronic blood loss anemia in in the setting of supratherapeutic INR: Hgb ~8.0 (baseline)> 7.2 (admit)> 6.5>2u>7.2> 7.1.  FOBT positive.  INR improved after vitamin K.  Colo on 03/10/2019 with diverticulosis and hemorrhoids.  Hemodynamically stable. -Monitor H&H every 8 hours -Continue Protonix to IV every 12 hours. -  Consult [facility] following-maybe EGD when INR allows. -Follow haptoglobin  Supratherapeutic INR: INR 7.2> 7.6> vitamin K>> 4.8 -Warfarin on hold. -Recheck INR in the morning.  Hyponatremia: Likely due to renal failure.  Relatively stable. -Continue monitoring  Mechanical MVR/AVR: INR remains supratherapeutic. -Hold warfarin  At risk for sleep apnea/OHS: Body mass index is 48.12 kg/m. -Nightly CPAP-refusing.  Recent hospitalization for group G Streptococcus  bacteremia from RLE cellulitis in patient with lymphedema -Completed antibiotic course with penicillin G followed by oral Zyvox for a total of 14 days through 12/22. -Evaluated by ID, Dr. Tommy Medal on 1/11 and prescribed Augmentin for 2 more weeks with refill. -Blood cultures negative x2 this admission. -Continue oral amoxicillin and Florastor.  Paroxysmal atrial fibrillation: NSR.  Not on rate or rhythm control. -Warfarin on hold in the setting of supratherapeutic INR.   Chronic pain:  -Continue scheduled Tylenol with as needed oxycodone.   Generalized weakness/debility -PT/OT-recommend CIR.  Lymphedema/possible right LE cellulitis -Diuretics and antibiotics as above -Appreciate WOC  Morbid obesity: Body mass index is 48.12 kg/m. -Encourage lifestyle change to lose weight  Goal of care: Patient is full code but interested in palliative care consult. -Palliative care consulted        DVT prophylaxis: None.  INR supratherapeutic. Code Status: Full code-confirmed with patient. Family Communication: Patient and RN.  Updated patient's wife at bedside on 2/4.   Discharge barrier: Multiple medical issues as above Patient is from: Home Final disposition: Likely CIR once medically stable.  Consultants: Cardiology   Microbiology summarized: [id number] and influenza PCR negative. Blood cultures pending.  Sch Meds:  Scheduled Meds: . sodium chloride   Intravenous Once  . acetaminophen  1,000 mg Oral Q8H  . acetaZOLAMIDE  250 mg Oral BID  . amoxicillin  500 mg Oral Q8H  . ferrous sulfate  325 mg Oral Q breakfast  . mouth rinse  15 mL Mouth Rinse BID  . pantoprazole  40 mg Oral BID   Continuous Infusions: . furosemide (LASIX) infusion 20 mg/hr (05/16/19 1320)   PRN Meds:.lip balm, oxyCODONE  Antimicrobials: Anti-infectives (From admission, onward)   Start     Dose/Rate Route Frequency Ordered Stop   05/14/19 1700  amoxicillin (AMOXIL) capsule 500 mg     500 mg Oral Every  8 hours 05/14/19 1620         I have personally reviewed the following labs and images: CBC: Recent Labs  Lab 05/13/19 1759 05/13/19 1759 05/14/19 0435 05/14/19 0435 05/14/19 0819 05/15/19 0559 05/15/19 1109 05/15/19 1910 05/16/19 0309  WBC 8.2  --  8.2  --  7.7 8.1  --   --   --   NEUTROABS  --   --  5.5  --   --   --   --   --   --   HGB 7.2*   < > 6.5*   < > 6.4* 7.2* 7.2* 7.4* 7.1*  HCT 25.3*   < > 21.4*   < > 21.9* 23.6* 23.7* 24.2* 23.1*  MCV 100.0  --  94.7  --  95.6 94.4  --   --   --   PLT 280  --  266  --  257 256  --   --   --    < > = values in this interval not displayed.   BMP &GFR Recent Labs  Lab 05/14/19 0435 05/14/19 1210 05/15/19 0559 05/15/19 1518 05/16/19 0309  NA 132* 129* 131* 130* 132*  K 6.4*  6.0* 5.5* 6.0* 4.9  CL 91* 90* 91* 89* 87*  CO2 29 29 29 28 29   GLUCOSE 94 100* 125* 121* 113*  BUN 89* 93* 98* 98* 100*  CREATININE 2.77* 2.76* 2.91* 2.89* 2.84*  CALCIUM 8.8* 8.6* 8.7* 8.6* 8.8*  MG  --   --  3.1*  --  3.0*  PHOS  --  4.6 5.7* 5.3* 5.3*   Estimated Creatinine Clearance: 36.4 mL/min (A) (by C-G formula based on SCr of 2.84 mg/dL (H)). Liver & Pancreas: Recent Labs  Lab 05/14/19 0435 05/14/19 1210 05/15/19 0559 05/15/19 1518 05/16/19 0309  AST 40  --   --   --   --   ALT 15  --   --   --   --   ALKPHOS 140*  --   --   --   --   BILITOT 1.4*  --   --   --   --   PROT 7.2  --   --   --   --   ALBUMIN 2.5* 2.4* 2.5* 2.5* 2.4*   No results for input(s): LIPASE, AMYLASE in the last 168 hours. No results for input(s): AMMONIA in the last 168 hours. Diabetic: No results for input(s): HGBA1C in the last 72 hours. Recent Labs  Lab 05/14/19 1128  GLUCAP 96   Cardiac Enzymes: No results for input(s): CKTOTAL, CKMB, CKMBINDEX, TROPONINI in the last 168 hours. No results for input(s): PROBNP in the last 8760 hours. Coagulation Profile: Recent Labs  Lab 05/14/19 0435 05/14/19 1428 05/15/19 0559 05/15/19 1518 05/16/19 0309   INR 7.6* 6.6* 6.4* 6.6* 4.8*   Thyroid Function Tests: Recent Labs    05/14/19 0435  TSH 4.695*   Lipid Profile: No results for input(s): CHOL, HDL, LDLCALC, TRIG, CHOLHDL, LDLDIRECT in the last 72 hours. Anemia Panel: Recent Labs    05/14/19 1730 05/14/19 1733  VITAMINB12  --  2,244*  FOLATE  --  11.4  FERRITIN  --  40  TIBC  --  435  IRON  --  67  RETICCTPCT 4.3*  --    Urine analysis:    Component Value Date/Time   COLORURINE YELLOW 03/17/2019 0054   APPEARANCEUR CLOUDY (A) 03/17/2019 0054   LABSPEC 1.006 03/17/2019 0054   PHURINE 6.0 03/17/2019 0054   GLUCOSEU NEGATIVE 03/17/2019 0054   HGBUR SMALL (A) 03/17/2019 0054   BILIRUBINUR NEGATIVE 03/17/2019 0054   [place] 03/17/2019 0054   PROTEINUR 30 (A) 03/17/2019 0054   NITRITE NEGATIVE 03/17/2019 0054   LEUKOCYTESUR LARGE (A) 03/17/2019 0054   Sepsis Labs: Invalid input(s): PROCALCITONIN, [place]  Microbiology: Recent Results (from the past 240 hour(s))  Respiratory Panel by RT PCR (Flu A&B, Covid) - Nasopharyngeal Swab     Status: None   Collection Time: 05/13/19  8:19 PM   Specimen: Nasopharyngeal Swab  Result Value Ref Range Status   SARS Coronavirus 2 by RT PCR NEGATIVE NEGATIVE Final    Comment: (NOTE) SARS-CoV-2 target nucleic acids are NOT DETECTED. The SARS-CoV-2 RNA is generally detectable in upper respiratoy specimens during the acute phase of infection. The lowest concentration of SARS-CoV-2 viral copies this assay can detect is 131 copies/mL. A negative result does not preclude SARS-Cov-2 infection and should not be used as the sole basis for treatment or other patient management decisions. A negative result may occur with  improper specimen collection/handling, submission of specimen other than nasopharyngeal swab, presence of viral mutation(s) within the areas targeted by this assay, and  inadequate number of viral copies (<131 copies/mL). A negative result must be combined  with clinical observations, patient history, and epidemiological information. The expected result is Negative. Fact Sheet for Patients:  PinkCheek.be Fact Sheet for Healthcare Providers:  GravelBags.it This test is not yet ap proved or cleared by the [place] FDA and  has been authorized for detection and/or diagnosis of SARS-CoV-2 by FDA under an Emergency Use Authorization (EUA). This EUA will remain  in effect (meaning this test can be used) for the duration of the COVID-19 declaration under Section 564(b)(1) of the Act, 21 U.S.C. section 360bbb-3(b)(1), unless the authorization is terminated or revoked sooner.    Influenza A by PCR NEGATIVE NEGATIVE Final   Influenza B by PCR NEGATIVE NEGATIVE Final    Comment: (NOTE) The Xpert Xpress SARS-CoV-2/FLU/RSV assay is intended as an aid in  the diagnosis of influenza from Nasopharyngeal swab specimens and  should not be used as a sole basis for treatment. Nasal washings and  aspirates are unacceptable for Xpert Xpress SARS-CoV-2/FLU/RSV  testing. Fact Sheet for Patients: PinkCheek.be Fact Sheet for Healthcare Providers: GravelBags.it This test is not yet approved or cleared by the [place] FDA and  has been authorized for detection and/or diagnosis of SARS-CoV-2 by  FDA under an Emergency Use Authorization (EUA). This EUA will remain  in effect (meaning this test can be used) for the duration of the  Covid-19 declaration under Section 564(b)(1) of the Act, 21  U.S.C. section 360bbb-3(b)(1), unless the authorization is  terminated or revoked. Performed at [facility], [place] [address] [place], [place] [address]   Culture, blood (routine x 2)     Status: None (Preliminary result)   Collection Time: 05/14/19  2:28 PM   Specimen: BLOOD RIGHT HAND  Result Value Ref Range Status   Specimen Description  BLOOD RIGHT HAND  Final   Special Requests   Final    BOTTLES DRAWN AEROBIC ONLY Blood Culture adequate volume   Culture   Final    NO GROWTH 2 DAYS Performed at [facility], [place] [address] [place], [place] [address]    Report Status PENDING  Incomplete  Culture, blood (routine x 2)     Status: None (Preliminary result)   Collection Time: 05/14/19  2:34 PM   Specimen: BLOOD LEFT HAND  Result Value Ref Range Status   Specimen Description BLOOD LEFT HAND  Final   Special Requests   Final    BOTTLES DRAWN AEROBIC ONLY Blood Culture adequate volume   Culture   Final    NO GROWTH 2 DAYS Performed at [facility], [place] [address], [place], [place] [address]    Report Status PENDING  Incomplete    Radiology Studies: [name] EKG SITE RITE  Result Date: 05/16/2019 If Site Rite image not attached, placement could not be confirmed due to current cardiac rhythm.   Emilyrose Darrah T. Deuel  If 7PM-7AM, please contact night-coverage [email or web address] Password [facility] 05/16/2019, 3:18 PM

## 2019-05-16 NOTE — Procedures (Signed)
Patient refusing CPAP for the night. 

## 2019-05-16 NOTE — Progress Notes (Signed)
Daily Rounding Note  05/16/2019, 9:18 AM  LOS: 3 days   SUBJECTIVE:   Chief complaint: anemia, FOBT +, supratherapeutic INR    1.3 L urine output 2/4.  750 mL thus far today.   Breathing slightly better.   Pt displeased with restricted visiting hours as he relys on his wife for assistance and she can not stay bedside later in PM.   No N/V, abd pain.   OBJECTIVE:         Vital signs in last 24 hours:    Temp:  [97.5 F (36.4 C)-98.2 F (36.8 C)] 98.2 F (36.8 C) (02/05 0359) Pulse Rate:  [60-67] 67 (02/05 0839) Resp:  [14-15] 14 (02/05 0839) BP: (135-152)/(52-71) 141/71 (02/05 0839) SpO2:  [98 %-100 %] 99 % (02/05 0839) Weight:  [156.5 kg] 156.5 kg (02/05 0655) Last BM Date: 05/15/19 Filed Weights   05/14/19 0654 05/15/19 0454 05/16/19 0655  Weight: (!) 157.4 kg (!) 155.6 kg (!) 156.5 kg   General: obese, acutely/chronically ill looking   Heart: RRR with metallic valve click  Chest: clear bil in front.  Dyspnea at rest and w speach Abdomen: obese, soft, NT.  Active BS  Extremities: signif bil LE edema and RLE erythema.  Both legs ace-wrapped to knees Neuro/Psych:  Oriented x 3.  Fluid speech.  No tremors.  Moves all 4 limbs.    Intake/Output from previous day: 02/04 0701 - 02/05 0700 In: 938.6 [P.O.:800; I.V.:138.6] Out: 1300 [Urine:1300]  Intake/Output this shift: Total I/O In: -  Out: 750 [Urine:750]  Lab Results: Recent Labs    05/14/19 0435 05/14/19 0435 05/14/19 0819 05/14/19 0819 05/15/19 0559 05/15/19 0559 05/15/19 1109 05/15/19 1910 05/16/19 0309  WBC 8.2  --  7.7  --  8.1  --   --   --   --   HGB 6.5*   < > 6.4*   < > 7.2*   < > 7.2* 7.4* 7.1*  HCT 21.4*   < > 21.9*   < > 23.6*   < > 23.7* 24.2* 23.1*  PLT 266  --  257  --  256  --   --   --   --    < > = values in this interval not displayed.   BMET Recent Labs    05/15/19 0559 05/15/19 1518 05/16/19 0309  NA 131* 130* 132*  K  5.5* 6.0* 4.9  CL 91* 89* 87*  CO2 29 28 29   GLUCOSE 125* 121* 113*  BUN 98* 98* 100*  CREATININE 2.91* 2.89* 2.84*  CALCIUM 8.7* 8.6* 8.8*   LFT Recent Labs    05/14/19 0435 05/14/19 1210 05/15/19 0559 05/15/19 1518 05/16/19 0309  PROT 7.2  --   --   --   --   ALBUMIN 2.5*   < > 2.5* 2.5* 2.4*  AST 40  --   --   --   --   ALT 15  --   --   --   --   ALKPHOS 140*  --   --   --   --   BILITOT 1.4*  --   --   --   --    < > = values in this interval not displayed.   PT/INR Recent Labs    05/15/19 1518 05/16/19 0309  LABPROT 58.1* 44.7*  INR 6.6* 4.8*   Hepatitis Panel No results for input(s): HEPBSAG, HCVAB, HEPAIGM, HEPBIGM in the last  72 hours.  Studies/Results: ECHOCARDIOGRAM COMPLETE  Result Date: 05/15/2019 IMPRESSIONS  1. Left ventricular ejection fraction, by visual estimation, is 55 to 60%. The left ventricle has normal function. Left ventricular septal wall thickness was mildly increased. There is no left ventricular hypertrophy.  2. The left ventricle has no regional wall motion abnormalities.  3. 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  4. Global right ventricle has moderately reduced systolic function.The right ventricular size is moderately enlarged. No increase in right ventricular wall thickness.  5. Left atrial size was moderately dilated.  6. Right atrial size was severely dilated.  7. The mitral valve has been repaired/replaced. No evidence of mitral valve regurgitation.  8. Normal appearing mechanical MVR Compared to TTE done 03/19/19 mean gradient gone from 5-6.5 mmHg with MVA 2.4-> 2.1 cm2 by PT1/2.  9. The tricuspid valve is degenerative. 10. The tricuspid valve is degenerative. 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 11. Pacing wire across TV seems redundant and possibly interferes with TV This and previous lead removal likely contributes to patients TR and right heart failure. 12. Aortic valve regurgitation is trivial. Mild  aortic valve stenosis. 13. Historically the AV has been repaired after surgery for HOCM/Ablation damaged valve. The right and non coronary cusps are thickened/calcified and may have been plicated Compared to TTE done 03/19/19 mean gradient gone from 9-> 11 mmHg and peak from 16  to 22 mmHg. 14. Pulmonic regurgitation is mild. 15. The pulmonic valve was not well visualized. Pulmonic valve regurgitation is mild. 16. Mildly elevated pulmonary artery systolic pressure. 17. The interatrial septum was not well visualized. FINDINGS  Left Ventricle: Left ventricular ejection fraction, by visual estimation, is 55 to 60%. The left ventricle has normal function. The left ventricle has no regional wall motion abnormalities. The left ventricular internal cavity size was the left ventricle is normal in size. There is no left ventricular hypertrophy. 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 Right Ventricle: The right ventricular size is moderately enlarged. No increase in right ventricular wall thickness. Global RV systolic function is has moderately reduced systolic function. The tricuspid regurgitant velocity is 2.45 m/s, and with an assumed right atrial pressure of 15 mmHg, the estimated right ventricular systolic pressure is mildly elevated at 39.0 mmHg. Left Atrium: Left atrial size was moderately dilated. Right Atrium: Right atrial size was severely dilated Pericardium: There is no evidence of pericardial effusion. Mitral Valve: The mitral valve has been repaired/replaced. No evidence of mitral valve regurgitation. MV peak gradient, 18.7 mmHg. Normal appearing mechanical MVR Compared to TTE done 03/19/19 mean gradient gone from 5-6.5 mmHg with MVA 2.4-> 2.1 cm2 by PT1/2. Tricuspid Valve: The tricuspid valve is degenerative in appearance. 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 Pacing wire across TV seems redundant and possibly interferes with TV This and previous lead removal likely contributes  to patients TR and right heart failure. Aortic Valve: The aortic valve has been repaired/replaced. Aortic valve regurgitation is trivial. Mild aortic stenosis is present. Aortic valve mean gradient measures 11.0 mmHg. Aortic valve peak gradient measures 22.3 mmHg. Aortic valve area, by VTI measures 1.69 cm. Historically the AV has been repaired after surgery for HOCM/Ablation damaged valve. The right and non coronary cusps are thickened/calcified and may have been plicated Compared to TTE done 03/19/19 mean gradient gone from 9-> 11 mmHg and peak from 16 to 22 mmHg. Pulmonic Valve: The pulmonic valve was not well visualized. Pulmonic valve regurgitation is mild. Pulmonic regurgitation is mild. Aorta: The  aortic root is normal in size and structure. IAS/Shunts: The interatrial septum was not well visualized.Electronically signed by Jenkins Rouge MD Signature Date/Time: 05/15/2019/12:48:20 PM    Final    Scheduled Meds: . sodium chloride   Intravenous Once  . acetaminophen  1,000 mg Oral Q8H  . acetaZOLAMIDE  250 mg Oral BID  . amoxicillin  500 mg Oral Q8H  . ferrous sulfate  325 mg Oral Q breakfast  . magnesium oxide  400 mg Oral QPM  . magnesium oxide  600 mg Oral Daily  . mouth rinse  15 mL Mouth Rinse BID  . pantoprazole (PROTONIX) IV  40 mg Intravenous Q12H  . Warfarin - Pharmacist Dosing Inpatient   Does not apply q1800   Continuous Infusions: . furosemide (LASIX) infusion 20 mg/hr (05/16/19 0054)   PRN Meds:.lip balm, oxyCODONE   ASSESMENT:   *    Acute on chronic anemia. Normocytic.  In setting of iatrogenic coagulopathy. FOBT +, chronic/stable dark stool on po iron since 03/11/19. S/p 2 PRBCs, Hgb 6.4 >> 7.1.  Feraheme 2/4.    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  Severe TR per current echo.   INR supratherapeutic. 2 doses oral, 1 dose SQ vitamin K.  Coumadin on hold. INR 7.6 >> 4.8.    *    AKI.  CKD stage III at baseline.  Hyperkalemia.    *    CHF.  Acute  on chronic diastolic CHF. Myomectomy for HOCM.  Pacemaker in situ.  *    Recurrent lower extremity cellulitis.  Initial incident with strep G sepsis in early 03/2019.    *    Hx recurrent adenomatous and assess colon polyps.  Colonoscopy in 02/2019 anemia and bleeding per rectum showed hemorrhoids, diverticulosis but no recurrent polyps.    PLAN   *   BID PPI.  Continue this through 2/20, then drop to 1 x daily.   Observe CBC.  No EGD unless refractory anemia in setting of corrected/therapeutic INR.  *   Haptoglobin per Dr Doyne Keel rec.      Frank [name]  05/16/2019, 9:18 AM Phone [phone]

## 2019-05-16 NOTE — Progress Notes (Signed)
Spoke via secure chat with Lyda Jester PA-C re: PICC order, patient does not need PICC right now. MD order midline insertion.

## 2019-05-16 NOTE — Progress Notes (Signed)
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  Pt with cellulitis of R LE, CHF, and questionable infection.    PT Comments    Patient received in bed, very fatigued today; wife present and  Assisted with session, reports he really did not sleep well as he was having nightmares all night last night. Patient agreeable for getting to EOB, able to do so with MinAx2 and use of bed features. Too fatigued to try standing today but this is likely due to lack of sleep. 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 He was left sitting at EOB with all needs met, bed alarm active and spouse providing direct supervision, RN aware. Discussed plan for therapy Monday and he is agreeable to attempting ambulation at that point after hopefully getting more sleep over the weekend.     Follow Up Recommendations  CIR;Supervision for mobility/OOB     Equipment Recommendations  Other (comment)(defer)    Recommendations for Other Services       Precautions / Restrictions Precautions Precautions: Fall Restrictions Weight Bearing Restrictions: No    Mobility  Bed Mobility Overal bed mobility: Needs Assistance Bed Mobility: Supine to Sit     Supine to sit: Min assist;+2 for physical assistance     General bed mobility comments: pt takes a lot of time to mobilize but evenutally can mobilize well given this time. Uses bed height adjustments and rails  to assist in bed mobility.  Would not mobilze as well without these.  Primarily uses lift chair at home.  Transfers                 General transfer comment: deferred- fatigue  Ambulation/Gait             General Gait Details: deferred- fatigue although INR improved today   Stairs             Wheelchair Mobility    Modified Rankin (Stroke Patients Only)       Balance Overall balance assessment: Needs assistance Sitting-balance support: Feet supported Sitting balance-Leahy Scale: Good                                      Cognition Arousal/Alertness: Awake/alert Behavior During Therapy: WFL for tasks assessed/performed Overall Cognitive Status: Within Functional Limits for tasks assessed                                 General Comments: baseline, more fatigued today and did not sleep well last night either but willing to participate      Exercises      General Comments General comments (skin integrity, edema, etc.): now on male suction catheter so pain from condom cath resolved      Pertinent Vitals/Pain Pain Assessment: Faces Pain Score: 0-No pain Pain Intervention(s): Limited activity within patient's tolerance;Monitored during  session    Home Living                      Prior Function            PT Goals (current goals can now be found in the care plan section) Acute Rehab PT Goals Patient Stated Goal: to feel stronger and to sleep PT Goal Formulation: With patient Time For Goal Achievement: 05/29/19 Potential to Achieve Goals: Good Progress towards PT goals: Progressing toward goals    Frequency    Min 3X/week      PT Plan Current plan remains appropriate    Co-evaluation              AM-PAC PT "6 Clicks" Mobility   Outcome Measure  Help needed turning from your back to your side while in a flat bed without using bedrails?: A Little Help needed moving from lying on your back  to sitting on the side of a flat bed without using bedrails?: A Lot Help needed moving to and from a bed to a chair (including a wheelchair)?: A Little Help needed standing up from a chair using your arms (e.g., wheelchair or bedside chair)?: A Little Help needed to walk in hospital room?: A Little Help needed climbing 3-5 steps with a railing? : A Lot 6 Click Score: 16    End of Session   Activity Tolerance: Patient tolerated treatment well Patient left: in bed;with call bell/phone within reach;with family/visitor present;with bed alarm set(sitting at EOB, family providing direct supervision and RN aware)   PT Visit Diagnosis: Muscle weakness (generalized) (M62.81);Difficulty in walking, not elsewhere classified (R26.2)     Time: 1753-0104 PT Time Calculation (min) (ACUTE ONLY): 39 min  Charges:  $Therapeutic Activity: 8-22 mins $Self Care/Home Management: 23-37                     Windell Norfolk, DPT, PN1   Supplemental Physical Therapist [place]    Pager [phone] Acute Rehab Office [phone]

## 2019-05-16 NOTE — Progress Notes (Signed)
Advanced Heart Failure Rounding Note   Subjective:    Lasix gtt increased to 20/hr last night. Diamox added. Was incontinent of urine last night. Seems like urine output improved some but not markedly. Weight unchanged.   Fatigued. Denies SOB, orthopnea or PND. Received IV iron today.  Bun/Cr stable at 100/2.8  Objective:   Weight Range:  Vital Signs:   Temp:  [97.7 F (36.5 C)-98.2 F (36.8 C)] 97.7 F (36.5 C) (02/05 1445) Pulse Rate:  [60-68] 68 (02/05 1445) Resp:  [14-16] 16 (02/05 1445) BP: (111-152)/(52-71) 111/52 (02/05 1445) SpO2:  [98 %-100 %] 100 % (02/05 1445) Weight:  [156.5 kg] 156.5 kg (02/05 0655) Last BM Date: 05/15/19  Weight change: Filed Weights   05/14/19 0654 05/15/19 0454 05/16/19 0655  Weight: (!) 157.4 kg (!) 155.6 kg (!) 156.5 kg    Intake/Output:   Intake/Output Summary (Last 24 hours) at 05/16/2019 1527 Last data filed at 05/16/2019 1325 Gross per 24 hour  Intake 1447.52 ml  Output 2050 ml  Net -602.48 ml     Physical Exam: General:  Morbidly obese male sitting up in bed. Chronically ill appearing No resp difficulty HEENT: normal Neck: supple. JVP to ear . Carotids 2+ bilat; no bruits. No lymphadenopathy or thryomegaly appreciated. Cor: PMI nondisplaced. Irregular rate & rhythm. 2/6 TR 2/6 AS mechanical s1 Lungs: clear Abdomen: obese soft, nontender, + distended. No hepatosplenomegaly. No bruits or masses. Good bowel sounds. Extremities: no cyanosis, clubbing, rash, 3+ edema + UNNA boots  Neuro: alert & orientedx3, cranial nerves grossly intact. moves all 4 extremities w/o difficulty. Affect pleasant  Telemetry: AV paced 60-70s Personally reviewed   Labs: Basic Metabolic Panel: Recent Labs  Lab 05/14/19 0435 05/14/19 0435 05/14/19 1210 05/14/19 1210 05/15/19 0559 05/15/19 1518 05/16/19 0309  NA 132*  --  129*  --  131* 130* 132*  K 6.4*  --  6.0*  --  5.5* 6.0* 4.9  CL 91*  --  90*  --  91* 89* 87*  CO2 29  --  29  --  29  28 29   GLUCOSE 94  --  100*  --  125* 121* 113*  BUN 89*  --  93*  --  98* 98* 100*  CREATININE 2.77*  --  2.76*  --  2.91* 2.89* 2.84*  CALCIUM 8.8*   < > 8.6*   < > 8.7* 8.6* 8.8*  MG  --   --   --   --  3.1*  --  3.0*  PHOS  --   --  4.6  --  5.7* 5.3* 5.3*   < > = values in this interval not displayed.    Liver Function Tests: Recent Labs  Lab 05/14/19 0435 05/14/19 1210 05/15/19 0559 05/15/19 1518 05/16/19 0309  AST 40  --   --   --   --   ALT 15  --   --   --   --   ALKPHOS 140*  --   --   --   --   BILITOT 1.4*  --   --   --   --   PROT 7.2  --   --   --   --   ALBUMIN 2.5* 2.4* 2.5* 2.5* 2.4*   No results for input(s): LIPASE, AMYLASE in the last 168 hours. No results for input(s): AMMONIA in the last 168 hours.  CBC: Recent Labs  Lab 05/13/19 1759 05/13/19 1759 05/14/19 0435 05/14/19 0435 05/14/19  7062 05/15/19 0559 05/15/19 1109 05/15/19 1910 05/16/19 0309  WBC 8.2  --  8.2  --  7.7 8.1  --   --   --   NEUTROABS  --   --  5.5  --   --   --   --   --   --   HGB 7.2*   < > 6.5*   < > 6.4* 7.2* 7.2* 7.4* 7.1*  HCT 25.3*   < > 21.4*   < > 21.9* 23.6* 23.7* 24.2* 23.1*  MCV 100.0  --  94.7  --  95.6 94.4  --   --   --   PLT 280  --  266  --  257 256  --   --   --    < > = values in this interval not displayed.    Cardiac Enzymes: No results for input(s): CKTOTAL, CKMB, CKMBINDEX, TROPONINI in the last 168 hours.  BNP: BNP (last 3 results) Recent Labs    03/16/19 2359 05/13/19 1930  BNP 105.7* 163.6*    ProBNP (last 3 results) No results for input(s): PROBNP in the last 8760 hours.    Other results:  Imaging: ECHOCARDIOGRAM COMPLETE  Result Date: 05/15/2019   ECHOCARDIOGRAM REPORT   Patient Name:   Frank [name] Date of Exam: 05/15/2019 Medical Rec #:  [id number]       Height:       71.0 in Accession #:    [id number]      Weight:       343.0 lb Date of Birth:  Dec 01, 1945      BSA:          2.65 m Patient Age:    74 years        BP:            131/69 mmHg Patient Gender: M               HR:           60 bpm. 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 CHF and Hypertrophic Cardiomyopathy,                 Pacemaker, Arrythmias:Atrial Fibrillation; Risk                 Factors:Hypertension.  Sonographer:    Mikki Santee RDCS (AE) Referring Phys: [phone] [facility] CR[facility]  Sonographer Comments: Image acquisition challenging due to patient body habitus. IMPRESSIONS  1. Left ventricular ejection fraction, by visual estimation, is 55 to 60%. The left ventricle has normal function. Left ventricular septal wall thickness was mildly increased. There is no left ventricular hypertrophy.  2. The left ventricle has no regional wall motion abnormalities.  3. 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  4. Global right ventricle has moderately reduced systolic function.The right ventricular size is moderately enlarged. No increase in right ventricular wall thickness.  5. Left atrial size was moderately dilated.  6. Right atrial size was severely dilated.  7. The mitral valve has been repaired/replaced. No evidence of mitral valve regurgitation.  8. Normal appearing mechanical MVR Compared to TTE done 03/19/19 mean gradient gone from 5-6.5 mmHg with MVA 2.4-> 2.1 cm2  by PT1/2.  9. The tricuspid valve is degenerative. 10. The tricuspid valve is degenerative. 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 11. Pacing wire across TV seems redundant and possibly interferes with TV This and previous lead removal likely contributes to patients TR and right heart failure. 12. Aortic valve regurgitation is trivial. Mild aortic valve stenosis. 13. Historically the AV has been repaired after surgery for HOCM/Ablation damaged valve. The right and non coronary cusps are  thickened/calcified and may have been plicated Compared to TTE done 03/19/19 mean gradient gone from 9-> 11 mmHg and peak from 16  to 22 mmHg. 14. Pulmonic regurgitation is mild. 15. The pulmonic valve was not well visualized. Pulmonic valve regurgitation is mild. 16. Mildly elevated pulmonary artery systolic pressure. 17. The interatrial septum was not well visualized. FINDINGS  Left Ventricle: Left ventricular ejection fraction, by visual estimation, is 55 to 60%. The left ventricle has normal function. The left ventricle has no regional wall motion abnormalities. The left ventricular internal cavity size was the left ventricle is normal in size. There is no left ventricular hypertrophy. 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 Right Ventricle: The right ventricular size is moderately enlarged. No increase in right ventricular wall thickness. Global RV systolic function is has moderately reduced systolic function. The tricuspid regurgitant velocity is 2.45 m/s, and with an assumed right atrial pressure of 15 mmHg, the estimated right ventricular systolic pressure is mildly elevated at 39.0 mmHg. Left Atrium: Left atrial size was moderately dilated. Right Atrium: Right atrial size was severely dilated Pericardium: There is no evidence of pericardial effusion. Mitral Valve: The mitral valve has been repaired/replaced. No evidence of mitral valve regurgitation. MV peak gradient, 18.7 mmHg. Normal appearing mechanical MVR Compared to TTE done 03/19/19 mean gradient gone from 5-6.5 mmHg with MVA 2.4-> 2.1 cm2 by PT1/2. Tricuspid Valve: The tricuspid valve is degenerative in appearance. 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 Pacing wire across TV seems redundant and possibly interferes with TV This and previous lead removal likely contributes to patients TR and right heart failure. Aortic Valve: The aortic valve has been repaired/replaced. Aortic valve regurgitation is trivial. Mild  aortic stenosis is present. Aortic valve mean gradient measures 11.0 mmHg. Aortic valve peak gradient measures 22.3 mmHg. Aortic valve area, by VTI measures 1.69 cm. Historically the AV has been repaired after surgery for HOCM/Ablation damaged valve. The right and non coronary cusps are thickened/calcified and may have been plicated Compared to TTE done 03/19/19 mean gradient gone from 9-> 11 mmHg and peak from 16 to 22 mmHg. Pulmonic Valve: The pulmonic valve was not well visualized. Pulmonic valve regurgitation is mild. Pulmonic regurgitation is mild. Aorta: The aortic root is normal in size and structure. IAS/Shunts: The interatrial septum was not well visualized.  LEFT VENTRICLE PLAX 2D LVOT diam:     2.40 cm LVOT Area:     4.52 cm  RIGHT VENTRICLE RV S prime:     9.90 cm/s TAPSE (M-mode): 1.8 cm LEFT ATRIUM              Index       RIGHT ATRIUM           Index LA Vol (A2C):   151.0 ml 56.94 ml/m RA Area:     30.90 cm LA Vol (A4C):   137.0 ml 51.66 ml/m RA Volume:   109.00 ml 41.10 ml/m LA Biplane Vol: 148.0 ml 55.81 ml/m  AORTIC VALVE AV Area (Vmax):    1.47 cm AV  Area (Vmean):   1.57 cm AV Area (VTI):     1.69 cm AV Vmax:           236.33 cm/s AV Vmean:          153.000 cm/s AV VTI:            0.507 m AV Peak Grad:      22.3 mmHg AV Mean Grad:      11.0 mmHg LVOT Vmax:         76.70 cm/s LVOT Vmean:        53.100 cm/s LVOT VTI:          0.190 m LVOT/AV VTI ratio: 0.37 MITRAL VALVE                         TRICUSPID VALVE MV Area (PHT): 2.13 cm              TR Peak grad:   24.0 mmHg MV Peak grad:  18.7 mmHg             TR Vmax:        254.00 cm/s MV Mean grad:  6.5 mmHg MV Vmax:       2.16 m/s              SHUNTS MV Vmean:      109.5 cm/s            Systemic VTI:  0.19 m MV VTI:        0.61 m                Systemic Diam: 2.40 cm MV PHT:        103.24 msec MV Decel Time: 356 msec MV E velocity: 172.00 cm/s 103 cm/s MV A velocity: 60.60 cm/s  70.3 cm/s MV E/A ratio:  2.84        1.5  Jenkins Rouge MD  Electronically signed by Jenkins Rouge MD Signature Date/Time: 05/15/2019/12:48:20 PM    Final    [name] EKG SITE RITE  Result Date: 05/16/2019 If Site Rite image not attached, placement could not be confirmed due to current cardiac rhythm.     Medications:     Scheduled Medications: . sodium chloride   Intravenous Once  . acetaminophen  1,000 mg Oral Q8H  . acetaZOLAMIDE  250 mg Oral BID  . amoxicillin  500 mg Oral Q8H  . ferrous sulfate  325 mg Oral Q breakfast  . mouth rinse  15 mL Mouth Rinse BID  . pantoprazole  40 mg Oral BID     Infusions: . furosemide (LASIX) infusion 20 mg/hr (05/16/19 1320)     PRN Medications:  lip balm, oxyCODONE   Assessment/Plan:   1. 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  - He is massively volume overloaded with severe R-sided HF in the setting of wide open TR (which I suspect is due to torn TV) - Not responding well to lasix gtt at 20 with diamox - Add milrinone 0.125  2. 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  - not candidate for surgery  3. AKI on CKD  -Baseline creatinine < 2.0  -Now 2.8 - suspect cardiorenal.  - add milrinone  4. Anemia  - GI consulted.  - Had colonoscopy in 02/2019 with ext/int hemmorhoids.  - LDH mildly elevated at 300. Suspect component of valvular hemolysis as well - GI and primary  team managing - INR high. Drifting down Discussed dosing with PharmD personally.   5. Mechanical MVR with moderate MS -INR supratherapeutic .  -Coumadin has been on hold.  - Discussed dosing with PharmD personally.  6. Supra Therapeutic INR  INR 6.6 off coumadin.   7. Moderate AS - medical therapy   8. Obesity  Body mass index is 47.84 kg/m.   Length of Stay: 3   Glori Bickers MD 05/16/2019, 3:27 PM  Advanced Heart Failure Team Pager [phone] (M-F; 7a - 4p)  Please contact [facility] for night-coverage after hours (4p -7a ) and weekends on  [email or web address]

## 2019-05-17 LAB — RENAL FUNCTION PANEL
Albumin: 2.3 g/dL — ABNORMAL LOW (ref 3.5–5.0)
Anion gap: 12 (ref 5–15)
BUN: 91 mg/dL — ABNORMAL HIGH (ref 8–23)
CO2: 30 mmol/L (ref 22–32)
Calcium: 8.3 mg/dL — ABNORMAL LOW (ref 8.9–10.3)
Chloride: 91 mmol/L — ABNORMAL LOW (ref 98–111)
Creatinine, Ser: 2.37 mg/dL — ABNORMAL HIGH (ref 0.61–1.24)
GFR calc Af Amer: 31 mL/min — ABNORMAL LOW (ref >60)
GFR calc non Af Amer: 27 mL/min — ABNORMAL LOW (ref >60)
Glucose, Bld: 109 mg/dL — ABNORMAL HIGH (ref 70–99)
Phosphorus: 5.1 mg/dL — ABNORMAL HIGH (ref 2.5–4.6)
Potassium: 3.2 mmol/L — ABNORMAL LOW (ref 3.5–5.1)
Sodium: 133 mmol/L — ABNORMAL LOW (ref 135–145)

## 2019-05-17 LAB — COOXEMETRY PANEL
Carboxyhemoglobin: 2.7 % — ABNORMAL HIGH (ref 0.5–1.5)
Methemoglobin: 1.4 % (ref 0.0–1.5)
O2 Saturation: 68.2 %
Total hemoglobin: 6.9 g/dL — CL (ref 12.0–16.0)

## 2019-05-17 LAB — CBC
HCT: 21.9 % — ABNORMAL LOW (ref 39.0–52.0)
Hemoglobin: 6.5 g/dL — CL (ref 13.0–17.0)
MCH: 28.9 pg (ref 26.0–34.0)
MCHC: 29.7 g/dL — ABNORMAL LOW (ref 30.0–36.0)
MCV: 97.3 fL (ref 80.0–100.0)
Platelets: 233 10*3/uL (ref 150–400)
RBC: 2.25 MIL/uL — ABNORMAL LOW (ref 4.22–5.81)
RDW: 18 % — ABNORMAL HIGH (ref 11.5–15.5)
WBC: 7 10*3/uL (ref 4.0–10.5)
nRBC: 0.4 % — ABNORMAL HIGH (ref 0.0–0.2)

## 2019-05-17 LAB — TYPE AND SCREEN
ABO/RH(D): B POS
Antibody Screen: NEGATIVE

## 2019-05-17 LAB — PREPARE RBC (CROSSMATCH)

## 2019-05-17 LAB — HAPTOGLOBIN: Haptoglobin: <10 mg/dL — ABNORMAL LOW (ref 34–355)

## 2019-05-17 LAB — HEMOGLOBIN AND HEMATOCRIT, BLOOD
HCT: 23 % — ABNORMAL LOW (ref 39.0–52.0)
Hemoglobin: 7 g/dL — ABNORMAL LOW (ref 13.0–17.0)

## 2019-05-17 LAB — MAGNESIUM: Magnesium: 2.9 mg/dL — ABNORMAL HIGH (ref 1.7–2.4)

## 2019-05-17 LAB — LACTATE DEHYDROGENASE: LDH: 238 U/L — ABNORMAL HIGH (ref 98–192)

## 2019-05-17 LAB — PROTIME-INR
INR: 2.9 — ABNORMAL HIGH (ref 0.8–1.2)
Prothrombin Time: 30 seconds — ABNORMAL HIGH (ref 11.4–15.2)

## 2019-05-17 MED ORDER — POTASSIUM CHLORIDE CRYS ER 20 MEQ PO TBCR
40.0000 meq | EXTENDED_RELEASE_TABLET | Freq: Two times a day (BID) | ORAL | Status: AC
  Administered 2019-05-17 (×2): 40 meq via ORAL
  Filled 2019-05-17 (×2): qty 2

## 2019-05-17 MED ORDER — POLYETHYLENE GLYCOL 3350 17 G PO PACK: 17.0000 g | PACK | Freq: Two times a day (BID) | ORAL | Status: DC | PRN

## 2019-05-17 MED ORDER — SODIUM CHLORIDE 0.9% IV SOLUTION: Freq: Once | INTRAVENOUS | Status: AC

## 2019-05-17 MED ORDER — POTASSIUM CHLORIDE CRYS ER 20 MEQ PO TBCR
40.0000 meq | EXTENDED_RELEASE_TABLET | Freq: Once | ORAL | Status: AC
  Administered 2019-05-17: 14:00:00 40 meq via ORAL
  Filled 2019-05-17: qty 2

## 2019-05-17 NOTE — Progress Notes (Signed)
Advanced Heart Failure Rounding Note   Subjective:    Milrinone started 2/5 for severe RV failure. Remains on lasix gtt at 20 and diamox.   Weight down 1 pound. BUN/CR significantly improved. Co-ox 68% (via midline PICC)  Hgb 6.9 -> 6.5 haptoglobin < 10. INR 2.9   Objective:   Weight Range:  Vital Signs:   Temp:  [97.5 F (36.4 C)-98.1 F (36.7 C)] 98.1 F (36.7 C) (02/06 1004) Pulse Rate:  [51-127] 127 (02/06 1004) Resp:  [13-20] 13 (02/06 1004) BP: (94-133)/(37-72) 99/49 (02/06 1004) SpO2:  [94 %-100 %] 96 % (02/06 1004) Weight:  [156.1 kg] 156.1 kg (02/06 0554) Last BM Date: 05/15/19  Weight change: Filed Weights   05/15/19 0454 05/16/19 0655 05/17/19 0554  Weight: (!) 155.6 kg (!) 156.5 kg (!) 156.1 kg    Intake/Output:   Intake/Output Summary (Last 24 hours) at 05/17/2019 1124 Last data filed at 05/17/2019 0900 Gross per 24 hour  Intake 1515.21 ml  Output 3250 ml  Net -1734.79 ml     Physical Exam: General:  Morbidly obese male sitting up in bed. Chronically ill appearing No resp difficult HEENT: normal Neck: supple. JVPto ear. Carotids 2+ bilat; no bruits. No lymphadenopathy or thryomegaly appreciated. Cor: PMI nondisplaced. Regular rate & rhythm. 2/6 AS 2/6 TR mechanical s1 Lungs: clear Abdomen: obese soft, nontender, nondistended. No hepatosplenomegaly. No bruits or masses. Good bowel sounds. Extremities: no cyanosis, clubbing, rash, 3+ edema +UNNA Neuro: alert & orientedx3, cranial nerves grossly intact. moves all 4 extremities w/o difficulty. Affect pleasant  Telemetry: AV paced 60-70s Personally reviewed   Labs: Basic Metabolic Panel: Recent Labs  Lab 05/14/19 1210 05/14/19 1210 05/15/19 0559 05/15/19 0559 05/15/19 1518 05/16/19 0309 05/17/19 0614  NA 129*  --  131*  --  130* 132* 133*  K 6.0*  --  5.5*  --  6.0* 4.9 3.2*  CL 90*  --  91*  --  89* 87* 91*  CO2 29  --  29  --  28 29 30   GLUCOSE 100*  --  125*  --  121* 113* 109*  BUN  93*  --  98*  --  98* 100* 91*  CREATININE 2.76*  --  2.91*  --  2.89* 2.84* 2.37*  CALCIUM 8.6*   < > 8.7*   < > 8.6* 8.8* 8.3*  MG  --   --  3.1*  --   --  3.0* 2.9*  PHOS 4.6  --  5.7*  --  5.3* 5.3* 5.1*   < > = values in this interval not displayed.    Liver Function Tests: Recent Labs  Lab 05/14/19 0435 05/14/19 0435 05/14/19 1210 05/15/19 0559 05/15/19 1518 05/16/19 0309 05/17/19 0614  AST 40  --   --   --   --   --   --   ALT 15  --   --   --   --   --   --   ALKPHOS 140*  --   --   --   --   --   --   BILITOT 1.4*  --   --   --   --   --   --   PROT 7.2  --   --   --   --   --   --   ALBUMIN 2.5*   < > 2.4* 2.5* 2.5* 2.4* 2.3*   < > = values in this interval not displayed.  No results for input(s): LIPASE, AMYLASE in the last 168 hours. No results for input(s): AMMONIA in the last 168 hours.  CBC: Recent Labs  Lab 05/13/19 1759 05/13/19 1759 05/14/19 0435 05/14/19 0435 05/14/19 0819 05/14/19 0819 05/15/19 0559 05/15/19 0559 05/15/19 1109 05/15/19 1910 05/16/19 0309 05/16/19 1641 05/17/19 0614  WBC 8.2  --  8.2  --  7.7  --  8.1  --   --   --   --   --  7.0  NEUTROABS  --   --  5.5  --   --   --   --   --   --   --   --   --   --   HGB 7.2*   < > 6.5*   < > 6.4*   < > 7.2*   < > 7.2* 7.4* 7.1* 6.9* 6.5*  HCT 25.3*   < > 21.4*   < > 21.9*   < > 23.6*   < > 23.7* 24.2* 23.1* 22.8* 21.9*  MCV 100.0  --  94.7  --  95.6  --  94.4  --   --   --   --   --  97.3  PLT 280  --  266  --  257  --  256  --   --   --   --   --  233   < > = values in this interval not displayed.    Cardiac Enzymes: No results for input(s): CKTOTAL, CKMB, CKMBINDEX, TROPONINI in the last 168 hours.  BNP: BNP (last 3 results) Recent Labs    03/16/19 2359 05/13/19 1930  BNP 105.7* 163.6*    ProBNP (last 3 results) No results for input(s): PROBNP in the last 8760 hours.    Other results:  Imaging: ECHOCARDIOGRAM COMPLETE  Result Date: 05/15/2019   ECHOCARDIOGRAM REPORT    Patient Name:   Frank [name] Raynor Date of Exam: 05/15/2019 Medical Rec #:  [id number]       Height:       71.0 in Accession #:    [id number]      Weight:       343.0 lb Date of Birth:  1946/03/05      BSA:          2.65 m Patient Age:    74 years        BP:           131/69 mmHg Patient Gender: M               HR:           60 bpm. 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 CHF and Hypertrophic Cardiomyopathy,                 Pacemaker, Arrythmias:Atrial Fibrillation; Risk                 Factors:Hypertension.  Sonographer:    Mikki Santee RDCS (AE) Referring Phys: [phone] [facility]  Sonographer Comments: Image acquisition challenging due to patient body habitus. IMPRESSIONS  1. Left ventricular ejection fraction, by visual estimation, is 55 to 60%. The left ventricle has normal function. Left ventricular septal wall thickness  was mildly increased. There is no left ventricular hypertrophy.  2. The left ventricle has no regional wall motion abnormalities.  3. 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  4. Global right ventricle has moderately reduced systolic function.The right ventricular size is moderately enlarged. No increase in right ventricular wall thickness.  5. Left atrial size was moderately dilated.  6. Right atrial size was severely dilated.  7. The mitral valve has been repaired/replaced. No evidence of mitral valve regurgitation.  8. Normal appearing mechanical MVR Compared to TTE done 03/19/19 mean gradient gone from 5-6.5 mmHg with MVA 2.4-> 2.1 cm2 by PT1/2.  9. The tricuspid valve is degenerative. 10. The tricuspid valve is degenerative. 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 11. Pacing wire across TV seems redundant and possibly interferes with TV This and previous  lead removal likely contributes to patients TR and right heart failure. 12. Aortic valve regurgitation is trivial. Mild aortic valve stenosis. 13. Historically the AV has been repaired after surgery for HOCM/Ablation damaged valve. The right and non coronary cusps are thickened/calcified and may have been plicated Compared to TTE done 03/19/19 mean gradient gone from 9-> 11 mmHg and peak from 16  to 22 mmHg. 14. Pulmonic regurgitation is mild. 15. The pulmonic valve was not well visualized. Pulmonic valve regurgitation is mild. 16. Mildly elevated pulmonary artery systolic pressure. 17. The interatrial septum was not well visualized. FINDINGS  Left Ventricle: Left ventricular ejection fraction, by visual estimation, is 55 to 60%. The left ventricle has normal function. The left ventricle has no regional wall motion abnormalities. The left ventricular internal cavity size was the left ventricle is normal in size. There is no left ventricular hypertrophy. 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 Right Ventricle: The right ventricular size is moderately enlarged. No increase in right ventricular wall thickness. Global RV systolic function is has moderately reduced systolic function. The tricuspid regurgitant velocity is 2.45 m/s, and with an assumed right atrial pressure of 15 mmHg, the estimated right ventricular systolic pressure is mildly elevated at 39.0 mmHg. Left Atrium: Left atrial size was moderately dilated. Right Atrium: Right atrial size was severely dilated Pericardium: There is no evidence of pericardial effusion. Mitral Valve: The mitral valve has been repaired/replaced. No evidence of mitral valve regurgitation. MV peak gradient, 18.7 mmHg. Normal appearing mechanical MVR Compared to TTE done 03/19/19 mean gradient gone from 5-6.5 mmHg with MVA 2.4-> 2.1 cm2 by PT1/2. Tricuspid Valve: The tricuspid valve is degenerative in appearance. 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  Pacing wire across TV seems redundant and possibly interferes with TV This and previous lead removal likely contributes to patients TR and right heart failure. Aortic Valve: The aortic valve has been repaired/replaced. Aortic valve regurgitation is trivial. Mild aortic stenosis is present. Aortic valve mean gradient measures 11.0 mmHg. Aortic valve peak gradient measures 22.3 mmHg. Aortic valve area, by VTI measures 1.69 cm. Historically the AV has been repaired after surgery for HOCM/Ablation damaged valve. The right and non coronary cusps are thickened/calcified and may have been plicated Compared to TTE done 03/19/19 mean gradient gone from 9-> 11 mmHg and peak from 16 to 22 mmHg. Pulmonic Valve: The pulmonic valve was not well visualized. Pulmonic valve regurgitation is mild. Pulmonic regurgitation is mild. Aorta: The aortic root is normal in size and structure. IAS/Shunts: The interatrial septum was not well visualized.  LEFT VENTRICLE PLAX 2D LVOT diam:     2.40 cm LVOT Area:  4.52 cm  RIGHT VENTRICLE RV S prime:     9.90 cm/s TAPSE (M-mode): 1.8 cm LEFT ATRIUM              Index       RIGHT ATRIUM           Index LA Vol (A2C):   151.0 ml 56.94 ml/m RA Area:     30.90 cm LA Vol (A4C):   137.0 ml 51.66 ml/m RA Volume:   109.00 ml 41.10 ml/m LA Biplane Vol: 148.0 ml 55.81 ml/m  AORTIC VALVE AV Area (Vmax):    1.47 cm AV Area (Vmean):   1.57 cm AV Area (VTI):     1.69 cm AV Vmax:           236.33 cm/s AV Vmean:          153.000 cm/s AV VTI:            0.507 m AV Peak Grad:      22.3 mmHg AV Mean Grad:      11.0 mmHg LVOT Vmax:         76.70 cm/s LVOT Vmean:        53.100 cm/s LVOT VTI:          0.190 m LVOT/AV VTI ratio: 0.37 MITRAL VALVE                         TRICUSPID VALVE MV Area (PHT): 2.13 cm              TR Peak grad:   24.0 mmHg MV Peak grad:  18.7 mmHg             TR Vmax:        254.00 cm/s MV Mean grad:  6.5 mmHg MV Vmax:       2.16 m/s              SHUNTS MV Vmean:      109.5 cm/s             Systemic VTI:  0.19 m MV VTI:        0.61 m                Systemic Diam: 2.40 cm MV PHT:        103.24 msec MV Decel Time: 356 msec MV E velocity: 172.00 cm/s 103 cm/s MV A velocity: 60.60 cm/s  70.3 cm/s MV E/A ratio:  2.84        1.5  Jenkins Rouge MD Electronically signed by Jenkins Rouge MD Signature Date/Time: 05/15/2019/12:48:20 PM    Final    [name] EKG SITE RITE  Result Date: 05/16/2019 If Site Rite image not attached, placement could not be confirmed due to current cardiac rhythm.    Medications:     Scheduled Medications: . sodium chloride   Intravenous Once  . sodium chloride   Intravenous Once  . acetaminophen  1,000 mg Oral Q8H  . acetaZOLAMIDE  250 mg Oral BID  . amoxicillin  500 mg Oral Q8H  . ferrous sulfate  325 mg Oral Q breakfast  . mouth rinse  15 mL Mouth Rinse BID  . pantoprazole  40 mg Oral BID  . potassium chloride  40 mEq Oral BID  . potassium chloride  40 mEq Oral Once    Infusions: . furosemide (LASIX) infusion 20 mg/hr (05/17/19 0309)  . milrinone 0.125 mcg/kg/min (05/16/19 1836)    PRN Medications: lip  balm, oxyCODONE, sodium chloride flush   Assessment/Plan:   1. 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  - He is massively volume overloaded with severe R-sided HF in the setting of wide open TR (which I suspect is due to torn TV) - Was not responding well to lasix gtt at 20 with diamox. Milrinone added 2/5 - Co-ox 68% on milrinone 0.125.  - Renal function improved. Diuresis picking up but still massively overloaded.  - Increase lasix gtt to 25  2. 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  - not candidate for surgery  3. AKI on CKD  -Baseline creatinine < 2.0  - Was up to 2.8. Milrinone aded 2/5. Now 2.3 - suspect cardiorenal.  - continue milrinone  4. Anemia  - GI consulted.  - Had colonoscopy in 02/2019 with ext/int hemmorhoids.  - LDH mildly elevated at 300. Haptoglobin <10. Suspect  component of valvular hemolysis as well. Repeat LDH - GI and primary team managing. Agree with trasnfusion  - INR 2.9. Will likely need to restart coumadin (versus heparin bridge) soon with mechanical MVR. Discussed dosing with PharmD personally.   5. Mechanical MVR with moderate MS - INR supratherapeutic .  - Coumadin has been on hold.  - See discussion abve  6. Moderate AS - medical therapy   7. Obesity  Body mass index is 47.84 kg/m.  8. Hypokalemia - supp   Length of Stay: 4   Nakoma Gotwalt MD 05/17/2019, 11:24 AM  Advanced Heart Failure Team Pager [phone] (M-F; 7a - 4p)  Please contact [facility] for night-coverage after hours (4p -7a ) and weekends on [email or web address]

## 2019-05-17 NOTE — Progress Notes (Signed)
PROGRESS NOTE  Frank [name]:[id number] DOB: [birth year]   PCP: Frank Ruddy, MD  Patient is from: Home.  Lives with his wife.  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 Fib, lymphedema, CKD-4, debility, morbid obesity, recurrent hospitalization for sepsis secondary to Streptococcus from RLE cellulitis presenting with progressive dyspnea and weight gain.   In ED, hemodynamically stable except for brief tachycardia to 124.  Saturation 98% on room air. CXR consistent with CHF.  K7.0. Cr 2.67 (baseline~1.8).  INR 7.2.  Hgb 7.2 (baseline about 8.0).  EKG revealed paced rhythm.  COVID-19 negative.  Received IV Lasix 120 mg once and was started on Lasix drip by cardiologist.  Also received bicarb, IV insulin and D50, and admitted for CHF exacerbation, AKI, hyperkalemia, supratherapeutic INR and anemia.  Cardiology consulted and started on Lasix drip.  Hypokalemia resolved with Lokelma and IV Lasix.  INR improved after vitamin K.  Hemoglobin dropped requiring intermittent blood transfusions.  FOBT positive.  GI consulted.  Subjective: No major events overnight or this morning.  He is a sleepy this morning but wakes to voice.  States that he is uncomfortable in his bed due to his back.  He denies chest pain.  He says his breathing is fair.  Denies abdominal pain.  Hemoglobin dropped further to 6.5 even after 1 unit of blood overnight.  INR improved to 2.9  Objective: Vitals:   05/17/19 1300 05/17/19 1359 05/17/19 1400 05/17/19 1402  BP:    (!) 112/53  Pulse: 99 (!) 53 (!) 59 (!) 59  Resp: (!) 24 15 15 14   Temp:    (!) 97.5 F (36.4 C)  TempSrc:    Oral  SpO2: 92% 97% 96% 100%  Weight:      Height:        Intake/Output Summary (Last 24 hours) at 05/17/2019 1450 Last data filed at 05/17/2019 1400 Gross per 24 hour  Intake 2030.21 ml  Output 3500 ml  Net -1469.79 ml   Filed Weights   05/15/19 0454 05/16/19 0655 05/17/19 0554  Weight: (!) 155.6 kg (!) 156.5 kg (!) 156.1 kg    Examination:  GENERAL: No apparent distress.  Sleepy but wakes to voice. HEENT: MMM.  Vision and hearing grossly intact.  NECK: Supple.  Difficult to assess JVD RESP:  No IWOB.  Fair aeration bilaterally CVS:  RRR.  2/6 SEM over RUSB and LUSB ABD/GI/GU: Bowel sounds present. Soft. Non tender.  MSK/EXT: Unna boots over bilateral lower extremities.  No edema but improved SKIN: Unna boots over bilateral lower extremities NEURO: Sleepy but wakes to voice.  Fairly oriented.  No apparent focal neuro deficit other than global weakness mainly in the lower extremities PSYCH: Calm. Normal affect.  Procedures:  None  Assessment & Plan: Acute on chronic diastolic CHF: Echo in 25/05 with EF of 55 to 60%, moderate LVH, moderate LAE and severe RAE, moderate to severe TVR.  Had significant fluid overload with massive lymphedema.  Presented with dyspnea and weight gain.  CXR concerning for CHF. Had 3.4 L and 2 unmeasured voids in the last 24 hours.  Renal function improved. -Appreciate care by advanced heart failure team -On IV Lasix drip, Diamox and milrinone -Monitor fluid status, renal function and electrolytes -Sodium and fluid restrictions.  AKI on CKD-4/azotemia: Baseline  Cr 1.8-2.0 > 2.67 (admit) >> 2.84> 2.37.  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  -Continue monitoring.  Acute on  chronic blood loss anemia in in the setting of supratherapeutic INR: Hgb ~8.0 (baseline)> 7.2 (admit)> 6.5>2u>7.2> 7.1> 6.9>1u> 6.5>1u.  FOBT positive.  INR 2.9, improved after vitamin K.  LDH slightly elevated.  Haptoglobin low at 10.  Concern about valvular hemolysis.  -Transfuse 1 more unit -Monitor H&H every 8 hours -Continue Protonix to IV every 12 hours. -GI following-maybe EGD when INR allows.  Supratherapeutic INR: INR 7.2> 7.6> vitamin K>> 4.8> 2.9 -Warfarin on hold. -Recheck INR in the morning.  Hyponatremia: Likely  due to renal failure.  Relatively stable. -Continue monitoring  Mechanical MVR/AVR: INR remains supratherapeutic. -Hold warfarin  Severe TR: Concern for partial TV tear on TEE in 03/2019.  Not a candidate for surgery.  At risk for sleep apnea/OHS: Body mass index is 48 kg/m. -Nightly CPAP-refusing.  Recent hospitalization for group G Streptococcus bacteremia from RLE cellulitis in patient with lymphedema -Completed antibiotic course with penicillin G followed by oral Zyvox for a total of 14 days through 12/22. -Evaluated by ID, Dr. Tommy Medal on 1/11 and prescribed Augmentin for 2 more weeks with refill. -Blood cultures negative x2 this admission. -Received amoxicillin 2/3-2/6.  Will discontinue and monitor off antibiotics.  Paroxysmal atrial fibrillation: NSR.  Not on rate or rhythm control. -Warfarin on hold in the setting of supratherapeutic INR.   Chronic pain:  -Continue scheduled Tylenol with as needed oxycodone.   Generalized weakness/debility -PT/OT-recommended CIR.  Lymphedema/possible right LE cellulitis -Diuretics as above. -Appreciate WOC  Morbid obesity: Body mass index is 48 kg/m. -Encourage lifestyle change to lose weight  Goal of care: Patient is full code but interested in palliative care input. -Palliative care consulted        DVT prophylaxis: None.  INR supratherapeutic. Code Status: Full code-confirmed with patient. Family Communication: Patient and RN.  No family member at bedside today.  Discharge barrier: Multiple medical issues as above Patient is from: Home Final disposition: Likely CIR once medically stable.  Consultants: Cardiology   Microbiology summarized: [id number] and influenza PCR negative. Blood cultures negative so far  Sch Meds:  Scheduled Meds: . sodium chloride   Intravenous Once  . sodium chloride   Intravenous Once  . acetaminophen  1,000 mg Oral Q8H  . acetaZOLAMIDE  250 mg Oral BID  . amoxicillin  500 mg Oral Q8H  .  ferrous sulfate  325 mg Oral Q breakfast  . mouth rinse  15 mL Mouth Rinse BID  . pantoprazole  40 mg Oral BID  . potassium chloride  40 mEq Oral BID   Continuous Infusions: . furosemide (LASIX) infusion 20 mg/hr (05/17/19 0309)  . milrinone 0.125 mcg/kg/min (05/17/19 1135)   PRN Meds:.lip balm, oxyCODONE, sodium chloride flush  Antimicrobials: Anti-infectives (From admission, onward)   Start     Dose/Rate Route Frequency Ordered Stop   05/14/19 1700  amoxicillin (AMOXIL) capsule 500 mg     500 mg Oral Every 8 hours 05/14/19 1620         I have personally reviewed the following labs and images: CBC: Recent Labs  Lab 05/13/19 1759 05/13/19 1759 05/14/19 0435 05/14/19 0435 05/14/19 0819 05/14/19 0819 05/15/19 0559 05/15/19 0559 05/15/19 1109 05/15/19 1910 05/16/19 0309 05/16/19 1641 05/17/19 0614  WBC 8.2  --  8.2  --  7.7  --  8.1  --   --   --   --   --  7.0  NEUTROABS  --   --  5.5  --   --   --   --   --   --   --   --   --   --  HGB 7.2*   < > 6.5*   < > 6.4*   < > 7.2*   < > 7.2* 7.4* 7.1* 6.9* 6.5*  HCT 25.3*   < > 21.4*   < > 21.9*   < > 23.6*   < > 23.7* 24.2* 23.1* 22.8* 21.9*  MCV 100.0  --  94.7  --  95.6  --  94.4  --   --   --   --   --  97.3  PLT 280  --  266  --  257  --  256  --   --   --   --   --  233   < > = values in this interval not displayed.   BMP &GFR Recent Labs  Lab 05/14/19 1210 05/15/19 0559 05/15/19 1518 05/16/19 0309 05/17/19 0614  NA 129* 131* 130* 132* 133*  K 6.0* 5.5* 6.0* 4.9 3.2*  CL 90* 91* 89* 87* 91*  CO2 29 29 28 29 30   GLUCOSE 100* 125* 121* 113* 109*  BUN 93* 98* 98* 100* 91*  CREATININE 2.76* 2.91* 2.89* 2.84* 2.37*  CALCIUM 8.6* 8.7* 8.6* 8.8* 8.3*  MG  --  3.1*  --  3.0* 2.9*  PHOS 4.6 5.7* 5.3* 5.3* 5.1*   Estimated Creatinine Clearance: 43.5 mL/min (A) (by C-G formula based on SCr of 2.37 mg/dL (H)). Liver & Pancreas: Recent Labs  Lab 05/14/19 0435 05/14/19 0435 05/14/19 1210 05/15/19 0559  05/15/19 1518 05/16/19 0309 05/17/19 0614  AST 40  --   --   --   --   --   --   ALT 15  --   --   --   --   --   --   ALKPHOS 140*  --   --   --   --   --   --   BILITOT 1.4*  --   --   --   --   --   --   PROT 7.2  --   --   --   --   --   --   ALBUMIN 2.5*   < > 2.4* 2.5* 2.5* 2.4* 2.3*   < > = values in this interval not displayed.   No results for input(s): LIPASE, AMYLASE in the last 168 hours. No results for input(s): AMMONIA in the last 168 hours. Diabetic: No results for input(s): HGBA1C in the last 72 hours. Recent Labs  Lab 05/14/19 1128  GLUCAP 96   Cardiac Enzymes: No results for input(s): CKTOTAL, CKMB, CKMBINDEX, TROPONINI in the last 168 hours. No results for input(s): PROBNP in the last 8760 hours. Coagulation Profile: Recent Labs  Lab 05/14/19 1428 05/15/19 0559 05/15/19 1518 05/16/19 0309 05/17/19 0614  INR 6.6* 6.4* 6.6* 4.8* 2.9*   Thyroid Function Tests: No results for input(s): TSH, T4TOTAL, FREET4, T3FREE, THYROIDAB in the last 72 hours. Lipid Profile: No results for input(s): CHOL, HDL, LDLCALC, TRIG, CHOLHDL, LDLDIRECT in the last 72 hours. Anemia Panel: Recent Labs    05/14/19 1730 05/14/19 1733  VITAMINB12  --  2,244*  FOLATE  --  11.4  FERRITIN  --  40  TIBC  --  435  IRON  --  67  RETICCTPCT 4.3*  --    Urine analysis:    Component Value Date/Time   COLORURINE YELLOW 03/17/2019 0054   APPEARANCEUR CLOUDY (A) 03/17/2019 0054   LABSPEC 1.006 03/17/2019 0054   PHURINE 6.0 03/17/2019 0054   GLUCOSEU  NEGATIVE 03/17/2019 0054   HGBUR SMALL (A) 03/17/2019 0054   BILIRUBINUR NEGATIVE 03/17/2019 [place] 03/17/2019 0054   PROTEINUR 30 (A) 03/17/2019 0054   NITRITE NEGATIVE 03/17/2019 0054   LEUKOCYTESUR LARGE (A) 03/17/2019 0054   Sepsis Labs: Invalid input(s): PROCALCITONIN, [place]  Microbiology: Recent Results (from the past 240 hour(s))  Respiratory Panel by RT PCR (Flu A&B, Covid) - Nasopharyngeal  Swab     Status: None   Collection Time: 05/13/19  8:19 PM   Specimen: Nasopharyngeal Swab  Result Value Ref Range Status   SARS Coronavirus 2 by RT PCR NEGATIVE NEGATIVE Final    Comment: (NOTE) SARS-CoV-2 target nucleic acids are NOT DETECTED. The SARS-CoV-2 RNA is generally detectable in upper respiratoy specimens during the acute phase of infection. The lowest concentration of SARS-CoV-2 viral copies this assay can detect is 131 copies/mL. A negative result does not preclude SARS-Cov-2 infection and should not be used as the sole basis for treatment or other patient management decisions. A negative result may occur with  improper specimen collection/handling, submission of specimen other than nasopharyngeal swab, presence of viral mutation(s) within the areas targeted by this assay, and inadequate number of viral copies (<131 copies/mL). A negative result must be combined with clinical observations, patient history, and epidemiological information. The expected result is Negative. Fact Sheet for Patients:  PinkCheek.be Fact Sheet for Healthcare Providers:  GravelBags.it This test is not yet ap proved or cleared by the [place] FDA and  has been authorized for detection and/or diagnosis of SARS-CoV-2 by FDA under an Emergency Use Authorization (EUA). This EUA will remain  in effect (meaning this test can be used) for the duration of the COVID-19 declaration under Section 564(b)(1) of the Act, 21 U.S.C. section 360bbb-3(b)(1), unless the authorization is terminated or revoked sooner.    Influenza A by PCR NEGATIVE NEGATIVE Final   Influenza B by PCR NEGATIVE NEGATIVE Final    Comment: (NOTE) The Xpert Xpress SARS-CoV-2/FLU/RSV assay is intended as an aid in  the diagnosis of influenza from Nasopharyngeal swab specimens and  should not be used as a sole basis for treatment. Nasal washings and  aspirates are  unacceptable for Xpert Xpress SARS-CoV-2/FLU/RSV  testing. Fact Sheet for Patients: PinkCheek.be Fact Sheet for Healthcare Providers: GravelBags.it This test is not yet approved or cleared by the [place] FDA and  has been authorized for detection and/or diagnosis of SARS-CoV-2 by  FDA under an Emergency Use Authorization (EUA). This EUA will remain  in effect (meaning this test can be used) for the duration of the  Covid-19 declaration under Section 564(b)(1) of the Act, 21  U.S.C. section 360bbb-3(b)(1), unless the authorization is  terminated or revoked. Performed at [facility], [place] [address], [place], [place] [address]   Culture, blood (routine x 2)     Status: None (Preliminary result)   Collection Time: 05/14/19  2:28 PM   Specimen: BLOOD RIGHT HAND  Result Value Ref Range Status   Specimen Description BLOOD RIGHT HAND  Final   Special Requests   Final    BOTTLES DRAWN AEROBIC ONLY Blood Culture adequate volume   Culture   Final    NO GROWTH 3 DAYS Performed at [facility], [place] [address], [place], [place] [address]    Report Status PENDING  Incomplete  Culture, blood (routine x 2)     Status: None (Preliminary result)   Collection Time: 05/14/19  2:34 PM  Specimen: BLOOD LEFT HAND  Result Value Ref Range Status   Specimen Description BLOOD LEFT HAND  Final   Special Requests   Final    BOTTLES DRAWN AEROBIC ONLY Blood Culture adequate volume   Culture   Final    NO GROWTH 3 DAYS Performed at [facility], [address] [place], [place] [address]    Report Status PENDING  Incomplete    Radiology Studies: No results found.  Pranika Finks T. [place]  If 7PM-7AM, please contact night-coverage [email or web address] Password [email or web address] 05/17/2019, 2:50 PM

## 2019-05-17 NOTE — Progress Notes (Signed)
Patient's wife states that he does not wear a cpap at home. RT will monitor as needed.

## 2019-05-17 NOTE — Progress Notes (Signed)
ANTICOAGULATION CONSULT NOTE   Pharmacy Consult for Coumadin Indication: Atrial fibrillation and mechanical heart valve  Allergies  Allergen Reactions  . Warfarin And Related Other (See Comments)    COUMADIN or JANTOVEN BRAND ONLY** Per pt, was switched to generic in 2001 and had significantly decreased absorption > INR subtherapeutic> pt suffered a TIA and has been on brand name only since     Patient Measurements: Height: 5\' 11"  (180.3 cm) Weight: (!) 344 lb 2.2 oz (156.1 kg) IBW/kg (Calculated) : 75.3  Vital Signs: Temp: 97.5 F (36.4 C) (02/06 0630) Temp Source: Oral (02/06 0630) BP: 100/56 (02/06 0630) Pulse Rate: 94 (02/06 0326)  Labs: Recent Labs    05/14/19 0819 05/14/19 1210 05/15/19 0559 05/15/19 1109 05/15/19 1518 05/15/19 1910 05/16/19 0309 05/16/19 0309 05/16/19 1641 05/17/19 0614  HGB 6.4*   < > 7.2*   < >  --    < > 7.1*   < > 6.9* 6.5*  HCT 21.9*   < > 23.6*   < >  --    < > 23.1*  --  22.8* 21.9*  PLT 257  --  256  --   --   --   --   --   --  233  LABPROT  --    < > 56.8*  --  58.1*  --  44.7*  --   --  30.0*  INR  --    < > 6.4*  --  6.6*  --  4.8*  --   --  2.9*  CREATININE  --    < > 2.91*  --  2.89*  --  2.84*  --   --  2.37*   < > = values in this interval not displayed.    Estimated Creatinine Clearance: 43.5 mL/min (A) (by C-G formula based on SCr of 2.37 mg/dL (H)).   Medical History: Past Medical History:  Diagnosis Date  . Atrial arrhythmia    status post ablation in [place] with complication including damage to the aortic valve  . Atrial fibrillation ([place])   . AV block, 1st degree   . Bradycardia   . Cluster headache ~ 1980   "went away after I started taking one of the heart RXs" (06/29/2017)  . Diastolic CHF, chronic ([place])   . Diverticulosis   . History of gout   . HTN (hypertension)   . 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 Infection   . Internal hemorrhoids   . Morbid obesity ([place])   . Morbid obesity  with BMI of 45.0-49.9, adult ([place])   . Pacemaker  -SJM   . Skin cancer    "burned off RUE X 1; cut off nose X 2" (06/29/2017)  . TIA (transient ischemic attack) 2001  . Tubulovillous adenoma     Medications:  No current facility-administered medications on file prior to encounter.   Current Outpatient Medications on File Prior to Encounter  Medication Sig Dispense Refill  . acetaminophen (TYLENOL) 325 MG tablet Take 2 tablets (650 mg total) by mouth every 8 (eight) hours as needed for mild pain or fever.    . Amino Acids-Protein Hydrolys (FEEDING SUPPLEMENT, PRO-STAT SUGAR FREE 64,) LIQD Take 30 mLs by mouth 2 (two) times daily. (Patient taking differently: Take 30 mLs by mouth daily as needed (For Protein). ) 887 mL 0  . Ascorbic Acid (VITAMIN C) 1000 MG tablet Take 2,000 mg by mouth daily.      Marland Kitchen aspirin  81 MG tablet Take 81 mg by mouth daily.    . benzonatate (TESSALON) 100 MG capsule Take 1 capsule (100 mg total) by mouth 2 (two) times daily as needed for cough. 20 capsule 0  . Camphor-Eucalyptus-Menthol (VICKS VAPORUB EX) Apply 1 application topically daily as needed (For congestion).    . cephALEXin (KEFLEX) 500 MG capsule Take 500 mg by mouth 4 (four) times daily. Take for 10 days    . diphenhydramine-acetaminophen (TYLENOL PM) 25-500 MG TABS tablet Take 2 tablets by mouth at bedtime as needed (for sleep).    . Ensure Plus (ENSURE PLUS) LIQD Take 237 mLs by mouth 2 (two) times daily between meals.    . ferrous sulfate 325 (65 FE) MG tablet Take 325 mg by mouth daily with breakfast.    . fish oil-omega-3 fatty acids 1000 MG capsule Take 4 g by mouth every morning.     Marland Kitchen HYDROcodone-acetaminophen (NORCO/VICODIN) 5-325 MG tablet Take 1 tablet by mouth every 8 (eight) hours as needed for moderate pain or severe pain. 24 tablet 0  . Magnesium 250 MG TABS Take 500-750 mg by mouth See admin instructions. 750 mg in the morning, 500 mg in the evening    . Multiple Vitamin (MULTIVITAMIN) tablet  Take 1 tablet by mouth daily.      Marland Kitchen omeprazole (PRILOSEC) 20 MG capsule Take 1 capsule (20 mg total) by mouth daily. 90 capsule 1  . potassium chloride (KLOR-CON) 10 MEQ tablet Take 2 tablets (20 mEq total) by mouth 2 (two) times daily. (Patient taking differently: Take 30 mEq by mouth 2 (two) times daily. )    . torsemide (DEMADEX) 20 MG tablet Take 3 tablets (60 mg total) by mouth 2 (two) times daily. Take an extra tablet as needed 560 tablet 3  . warfarin (COUMADIN) 10 MG tablet Take 10-15 mg by mouth See admin instructions. Takes 10 mg po QD; extra 5 mg on Sunday and Wednesday     . Wheat Dextrin (BENEFIBER) CHEW Chew 3 tablets by mouth daily.    . Ensure Max Protein (ENSURE MAX PROTEIN) LIQD Take 330 mLs (11 oz total) by mouth daily. (Patient not taking: Reported on 05/14/2019) 330 mL 12  . JANTOVEN 10 MG tablet Take 0.5 tablets (5 mg total) by mouth daily at 6 PM. Take above dose until follow up with your Coumadin clinic then follow their instructions for dosing. (Patient not taking: Reported on 05/14/2019) 30 tablet 0  . [DISCONTINUED] enoxaparin (LOVENOX) 150 MG/ML injection Inject 1 mL (150 mg total) into the skin every 12 (twelve) hours. 7 mL 0  . [DISCONTINUED] fondaparinux (ARIXTRA) 10 MG/0.8ML SOLN injection Inject 0.8 mLs (10 mg total) into the skin daily. 4 mL 1     Assessment: 74 y.o. male admitted with CHF, h/o Afib and MVR, to continue Coumadin.   INR trending down to 2.9 today after held doses and vitamin K. Hgb 6.5 - receiving transfusion, plt 233.   With plans for possible RHC will continue to hold warfarin and will follow along for possible need of heparin bridge.    Goal of Therapy:  INR 2.5-3.5 Monitor platelets by anticoagulation protocol: Yes   Plan:  Continue to hold warfarin - no bridge needed at this time Monitor INR, CBC, and for s/sx of bleeding   Antonietta Jewel, PharmD, BCCCP Clinical Pharmacist  Phone: [phone]  Please check AMION for all [place] phone  numbers After 10:00 PM, call [place] [phone] 05/17/2019 7:29 AM

## 2019-05-17 NOTE — Progress Notes (Signed)
Gastroenterology Inpatient Follow-up Note   PATIENT IDENTIFICATION  Frank [name] is a 74 y.o. 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 fib status post ablation, colon polyps who presented with anemia and occult positive stools in the setting of supratherapeutic INR. Hospital Day: 5  SUBJECTIVE  Hemoglobin has decreased without overt evidence of haptoglobin elevation.  INR decreasing today finally to 2.9 after many days of being in the sixes or above.  Hemoglobin down trended slightly but no overt GI bleeding has been noted.  Transfusion given yesterday into today.  No other significant GI complaints the patient today.   OBJECTIVE  Scheduled Inpatient Medications:  . sodium chloride   Intravenous Once  . sodium chloride   Intravenous Once  . acetaminophen  1,000 mg Oral Q8H  . acetaZOLAMIDE  250 mg Oral BID  . amoxicillin  500 mg Oral Q8H  . ferrous sulfate  325 mg Oral Q breakfast  . mouth rinse  15 mL Mouth Rinse BID  . pantoprazole  40 mg Oral BID   Continuous Inpatient Infusions:  . furosemide (LASIX) infusion 25 mg/hr (05/17/19 1636)  . milrinone 0.125 mcg/kg/min (05/17/19 1135)   PRN Inpatient Medications: lip balm, oxyCODONE, polyethylene glycol, sodium chloride flush   Physical Examination  Temp:  [97.5 F (36.4 C)-98.1 F (36.7 C)] 97.9 F (36.6 C) (02/06 2003) Pulse Rate:  [51-127] 92 (02/06 2154) Resp:  [13-24] 22 (02/06 2154) BP: (84-120)/(37-57) 119/46 (02/06 2154) SpO2:  [92 %-100 %] 96 % (02/06 2003) Weight:  [156.1 kg] 156.1 kg (02/06 0554) Temp (24hrs), Avg:97.7 F (36.5 C), Min:97.5 F (36.4 C), Max:98.1 F (36.7 C)  Weight: (!) 156.1 kg GEN: Chronically ill-appearing gentleman PSYCH: Cooperative EYE: Conjunctivae pink, sclerae anicteric ENT: MMM CV: Nontachycardic with systolic murmur present RESP: Rales appreciated GI:  NABS, soft, protuberant, nontender, without rebound or guarding  MSK/EXT: Lower extremity edema present SKIN: No jaundice NEURO:  Alert & Oriented x 3, no focal deficits   Review of Data   Laboratory Studies   Recent Labs  Lab 05/15/19 1518 05/15/19 1518 05/16/19 0309 05/16/19 0309 05/17/19 0614  NA 130*   < > 132*   < > 133*  K 6.0*   < > 4.9   < > 3.2*  CL 89*   < > 87*   < > 91*  CO2 28   < > 29   < > 30  BUN 98*   < > 100*   < > 91*  CREATININE 2.89*   < > 2.84*   < > 2.37*  GLUCOSE 121*   < > 113*   < > 109*  CALCIUM 8.6*   < > 8.8*   < > 8.3*  MG  --   --  3.0*   < > 2.9*  PHOS 5.3*  --  5.3*  --  5.1*   < > = values in this interval not displayed.   Recent Labs  Lab 05/14/19 0435  AST 40  ALT 15  ALKPHOS 140*    Recent Labs  Lab 05/14/19 0819 05/14/19 0819 05/15/19 0559 05/15/19 1109 05/17/19 0614 05/17/19 0614 05/17/19 1644  WBC 7.7   < > 8.1  --  7.0  --   --   HGB 6.4*   < > 7.2*   < > 6.5*   < > 7.0*  HCT 21.9*   < >  23.6*   < > 21.9*   < > 23.0*  PLT 257  --  256  --  233  --   --    < > = values in this interval not displayed.   Recent Labs  Lab 05/15/19 1518 05/16/19 0309 05/17/19 0614  INR 6.6* 4.8* 2.9*   MELD-Na score: 39 at 05/16/2019  3:09 AM MELD score: 35 at 05/16/2019  3:09 AM Calculated from: Serum Creatinine: 2.84 mg/dL at 05/16/2019  3:09 AM Serum Sodium: 132 mmol/L at 05/16/2019  3:09 AM Total Bilirubin: 1.4 mg/dL at 05/14/2019  4:35 AM INR(ratio): 6.6 at 05/14/2019  2:28 PM Age: 63 years 1 month  Imaging Studies  No new imaging studies to review  GI Procedures and Studies  No new relevant studies to review   ASSESSMENT  Frank [name] is a 74 y.o. 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 fib status post ablation, colon polyps who presented with anemia and occult positive stools in the setting of  supratherapeutic INR.  Patient is a high risk individual for endoscopic evaluation but he continues to have no overt GI bleeding however blood counts continue to drop.  If the patient continues to have a transfusion dependent anemia, once his heart failure has been stabilized then I think an endoscopy may be worthwhile to consider but I do not feel that he is ready for that at this time that we may have more issues should we try to pursue at this time.  If the patient has overt evidence of bleeding (melena/hematochezia/maroon stools) please take a photo and document in the media chart.  However, we will reassess the patient on Monday trend his hemoglobin over the rest of the weekend but call us if overt GI bleeding is noted.  In regards to his anticoagulation I think now that he is no longer supratherapeutic that if heparin drip were to be initiated it may be reasonable to see how he does with that but hold off Coumadin being restarted at 2 the dose will defer that discussion to cardiology and medicine.   PLAN/RECOMMENDATIONS  Trend hemoglobin over the weekend GI will see back on Monday unless overt GI bleeding occurs Query heparin drip at this time due to his mitral valve and to evaluate should he have recurrent bleeding that we can stop quickly Continue PPI twice daily at this time Hold on endoscopic evaluation for now and pursue only if patient becomes transfusion dependent and his heart failure is better controlled   Please page/call with questions or concerns.   Justice Britain, MD [facility] Office # [phone]    LOS: 4 days  Irving Copas  05/17/2019, 10:31 PM

## 2019-05-18 DIAGNOSIS — N170 Acute kidney failure with tubular necrosis: Secondary | ICD-10-CM

## 2019-05-18 DIAGNOSIS — Z515 Encounter for palliative care: Secondary | ICD-10-CM

## 2019-05-18 LAB — BPAM RBC
Blood Product Expiration Date: 202103012359
Blood Product Expiration Date: 202103022359
Blood Product Expiration Date: 202103022359
Blood Product Expiration Date: 202103092359
ISSUE DATE / TIME: 202102031553
ISSUE DATE / TIME: 202102032120
ISSUE DATE / TIME: 202102060324
ISSUE DATE / TIME: 202102060942
Unit Type and Rh: 7300
Unit Type and Rh: 7300
Unit Type and Rh: 7300
Unit Type and Rh: 7300

## 2019-05-18 LAB — CBC
HCT: 22.7 % — ABNORMAL LOW (ref 39.0–52.0)
Hemoglobin: 7 g/dL — ABNORMAL LOW (ref 13.0–17.0)
MCH: 29.3 pg (ref 26.0–34.0)
MCHC: 30.8 g/dL (ref 30.0–36.0)
MCV: 95 fL (ref 80.0–100.0)
Platelets: 257 10*3/uL (ref 150–400)
RBC: 2.39 MIL/uL — ABNORMAL LOW (ref 4.22–5.81)
RDW: 17.6 % — ABNORMAL HIGH (ref 11.5–15.5)
WBC: 8.6 10*3/uL (ref 4.0–10.5)
nRBC: 0.5 % — ABNORMAL HIGH (ref 0.0–0.2)

## 2019-05-18 LAB — COOXEMETRY PANEL
Carboxyhemoglobin: 2.7 % — ABNORMAL HIGH (ref 0.5–1.5)
Methemoglobin: 1.2 % (ref 0.0–1.5)
O2 Saturation: 76 %
Total hemoglobin: 7.9 g/dL — ABNORMAL LOW (ref 12.0–16.0)

## 2019-05-18 LAB — TYPE AND SCREEN
ABO/RH(D): B POS
Antibody Screen: NEGATIVE
Unit division: 0
Unit division: 0
Unit division: 0
Unit division: 0

## 2019-05-18 LAB — RENAL FUNCTION PANEL
Albumin: 2.6 g/dL — ABNORMAL LOW (ref 3.5–5.0)
Anion gap: 14 (ref 5–15)
BUN: 81 mg/dL — ABNORMAL HIGH (ref 8–23)
CO2: 29 mmol/L (ref 22–32)
Calcium: 8.3 mg/dL — ABNORMAL LOW (ref 8.9–10.3)
Chloride: 94 mmol/L — ABNORMAL LOW (ref 98–111)
Creatinine, Ser: 2.06 mg/dL — ABNORMAL HIGH (ref 0.61–1.24)
GFR calc Af Amer: 36 mL/min — ABNORMAL LOW (ref >60)
GFR calc non Af Amer: 31 mL/min — ABNORMAL LOW (ref >60)
Glucose, Bld: 108 mg/dL — ABNORMAL HIGH (ref 70–99)
Phosphorus: 4 mg/dL (ref 2.5–4.6)
Potassium: 3.2 mmol/L — ABNORMAL LOW (ref 3.5–5.1)
Sodium: 137 mmol/L (ref 135–145)

## 2019-05-18 LAB — PROTIME-INR
INR: 2.3 — ABNORMAL HIGH (ref 0.8–1.2)
Prothrombin Time: 25.5 seconds — ABNORMAL HIGH (ref 11.4–15.2)

## 2019-05-18 LAB — AMMONIA: Ammonia: 110 umol/L — ABNORMAL HIGH (ref 9–35)

## 2019-05-18 LAB — HEPARIN LEVEL (UNFRACTIONATED): Heparin Unfractionated: 0.51 IU/mL (ref 0.30–0.70)

## 2019-05-18 LAB — HEMOGLOBIN AND HEMATOCRIT, BLOOD
HCT: 25 % — ABNORMAL LOW (ref 39.0–52.0)
Hemoglobin: 7.5 g/dL — ABNORMAL LOW (ref 13.0–17.0)

## 2019-05-18 LAB — MAGNESIUM: Magnesium: 2.6 mg/dL — ABNORMAL HIGH (ref 1.7–2.4)

## 2019-05-18 MED ORDER — HEPARIN (PORCINE) 25000 UT/250ML-% IV SOLN
1300.0000 [IU]/h | INTRAVENOUS | Status: DC
  Administered 2019-05-18: 10:00:00 1300 [IU]/h via INTRAVENOUS
  Administered 2019-05-19: 05:00:00 1250 [IU]/h via INTRAVENOUS
  Administered 2019-05-19 – 2019-05-25 (×7): 1300 [IU]/h via INTRAVENOUS
  Filled 2019-05-18 (×10): qty 250

## 2019-05-18 MED ORDER — SENNA 8.6 MG PO TABS
2.0000 | ORAL_TABLET | Freq: Every day | ORAL | Status: DC
  Administered 2019-05-18 – 2019-05-19 (×2): 17.2 mg via ORAL
  Filled 2019-05-18 (×2): qty 2

## 2019-05-18 MED ORDER — METOLAZONE 5 MG PO TABS
2.5000 mg | ORAL_TABLET | Freq: Once | ORAL | Status: AC
  Administered 2019-05-18: 2.5 mg via ORAL
  Filled 2019-05-18: qty 1

## 2019-05-18 MED ORDER — SENNA 8.6 MG PO TABS: 1.0000 | ORAL_TABLET | Freq: Every day | ORAL | Status: DC

## 2019-05-18 MED ORDER — LACTULOSE 10 GM/15ML PO SOLN
20.0000 g | Freq: Three times a day (TID) | ORAL | Status: DC
  Administered 2019-05-18 – 2019-05-19 (×2): 20 g via ORAL
  Filled 2019-05-18 (×2): qty 30

## 2019-05-18 MED ORDER — GUAIFENESIN-DM 100-10 MG/5ML PO SYRP
5.0000 mL | ORAL_SOLUTION | ORAL | Status: DC | PRN
  Administered 2019-05-18: 18:00:00 5 mL via ORAL
  Filled 2019-05-18: qty 5

## 2019-05-18 MED ORDER — POTASSIUM CHLORIDE CRYS ER 20 MEQ PO TBCR: 30.0000 meq | EXTENDED_RELEASE_TABLET | ORAL | Status: DC

## 2019-05-18 MED ORDER — POTASSIUM CHLORIDE CRYS ER 20 MEQ PO TBCR
40.0000 meq | EXTENDED_RELEASE_TABLET | Freq: Two times a day (BID) | ORAL | Status: AC
  Administered 2019-05-18 (×2): 40 meq via ORAL
  Filled 2019-05-18 (×2): qty 2

## 2019-05-18 MED ORDER — POLYETHYLENE GLYCOL 3350 17 G PO PACK
17.0000 g | PACK | Freq: Once | ORAL | Status: AC
  Administered 2019-05-18: 16:00:00 17 g via ORAL
  Filled 2019-05-18: qty 1

## 2019-05-18 MED ORDER — POTASSIUM CHLORIDE CRYS ER 10 MEQ PO TBCR
30.0000 meq | EXTENDED_RELEASE_TABLET | ORAL | Status: AC
  Administered 2019-05-18 (×2): 30 meq via ORAL
  Filled 2019-05-18 (×2): qty 3

## 2019-05-18 NOTE — Progress Notes (Signed)
ANTICOAGULATION CONSULT NOTE   Pharmacy Consult for Coumadin Indication: Atrial fibrillation and mechanical heart valve  Allergies  Allergen Reactions  . Warfarin And Related Other (See Comments)    COUMADIN or JANTOVEN BRAND ONLY** Per pt, was switched to generic in 2001 and had significantly decreased absorption > INR subtherapeutic> pt suffered a TIA and has been on brand name only since     Patient Measurements: Height: 5\' 11"  (180.3 cm) Weight: (!) 342 lb 6.4 oz (155.3 kg) IBW/kg (Calculated) : 75.3  Vital Signs: Temp: 97.6 F (36.4 C) (02/07 1606) Temp Source: Oral (02/07 1606) BP: 110/93 (02/07 1606) Pulse Rate: 98 (02/07 1606)  Labs: Recent Labs    05/16/19 0309 05/16/19 1641 05/17/19 0614 05/17/19 0614 05/17/19 1644 05/17/19 1644 05/18/19 0455 05/18/19 1641  HGB 7.1*   < > 6.5*   < > 7.0*   < > 7.0* 7.5*  HCT 23.1*   < > 21.9*   < > 23.0*  --  22.7* 25.0*  PLT  --   --  233  --   --   --  257  --   LABPROT 44.7*  --  30.0*  --   --   --  25.5*  --   INR 4.8*  --  2.9*  --   --   --  2.3*  --   HEPARINUNFRC  --   --   --   --   --   --   --  0.51  CREATININE 2.84*  --  2.37*  --   --   --  2.06*  --    < > = values in this interval not displayed.    Estimated Creatinine Clearance: 49.9 mL/min (A) (by C-G formula based on SCr of 2.06 mg/dL (H)).    Assessment: 74 y.o. male admitted with CHF, h/o Afib and mechMVR, holding warfarin for possible RHC, on heparin with lower goal for possible GIB.   Received 1 unit RBC today, H/H responded appropriately. INR trending down to 2.3 today after held doses and vitamin K. HL slightly supratherapaeutic at 0.51. Per RN, last melena episode was 2/5 and no issues with heparin infusion. Will decrease rate slightly to keep in goal.   Goal of Therapy:  Heparin level: 0.3-0.5 INR 2.5-3.5 Monitor platelets by anticoagulation protocol: Yes   Plan:  Continue to hold warfarin  Decrease heparin to 1250 units/hr  Monitor CBC,  HL, and for s/sx of bleeding   Benetta Spar, PharmD, BCPS, [facility] Clinical Pharmacist  Please check AMION for all [place] phone numbers After 10:00 PM, call [place]

## 2019-05-18 NOTE — Progress Notes (Signed)
ANTICOAGULATION CONSULT NOTE   Pharmacy Consult for Coumadin Indication: Atrial fibrillation and mechanical heart valve  Allergies  Allergen Reactions  . Warfarin And Related Other (See Comments)    COUMADIN or JANTOVEN BRAND ONLY** Per pt, was switched to generic in 2001 and had significantly decreased absorption > INR subtherapeutic> pt suffered a TIA and has been on brand name only since     Patient Measurements: Height: 5\' 11"  (180.3 cm) Weight: (!) 342 lb 6.4 oz (155.3 kg) IBW/kg (Calculated) : 75.3  Vital Signs: Temp: 97.6 F (36.4 C) (02/07 0618) Temp Source: Axillary (02/07 0618) BP: 106/43 (02/07 0618) Pulse Rate: 97 (02/07 0618)  Labs: Recent Labs    05/16/19 0309 05/16/19 1641 05/17/19 0614 05/17/19 0614 05/17/19 1644 05/18/19 0455  HGB 7.1*   < > 6.5*   < > 7.0* 7.0*  HCT 23.1*   < > 21.9*  --  23.0* 22.7*  PLT  --   --  233  --   --  257  LABPROT 44.7*  --  30.0*  --   --  25.5*  INR 4.8*  --  2.9*  --   --  2.3*  CREATININE 2.84*  --  2.37*  --   --  2.06*   < > = values in this interval not displayed.    Estimated Creatinine Clearance: 49.9 mL/min (A) (by C-G formula based on SCr of 2.06 mg/dL (H)).   Medical History: Past Medical History:  Diagnosis Date  . Atrial arrhythmia    status post ablation in [place] with complication including damage to the aortic valve  . Atrial fibrillation ([place])   . AV block, 1st degree   . Bradycardia   . Cluster headache ~ 1980   "went away after I started taking one of the heart RXs" (06/29/2017)  . Diastolic CHF, chronic ([place])   . Diverticulosis   . History of gout   . HTN (hypertension)   . Hypertrophic cardiomyopathy ([place])    s/p myoectomy  . Infection   . Internal hemorrhoids   . Morbid obesity ([place])   . Morbid obesity with BMI of 45.0-49.9, adult ([place])   . Pacemaker  -SJM   . Skin cancer    "burned off RUE X 1; cut off nose X 2" (06/29/2017)  . TIA (transient ischemic attack) 2001  . Tubulovillous  adenoma     Medications:  No current facility-administered medications on file prior to encounter.   Current Outpatient Medications on File Prior to Encounter  Medication Sig Dispense Refill  . acetaminophen (TYLENOL) 325 MG tablet Take 2 tablets (650 mg total) by mouth every 8 (eight) hours as needed for mild pain or fever.    . Amino Acids-Protein Hydrolys (FEEDING SUPPLEMENT, PRO-STAT SUGAR FREE 64,) LIQD Take 30 mLs by mouth 2 (two) times daily. (Patient taking differently: Take 30 mLs by mouth daily as needed (For Protein). ) 887 mL 0  . Ascorbic Acid (VITAMIN C) 1000 MG tablet Take 2,000 mg by mouth daily.      Marland Kitchen aspirin 81 MG tablet Take 81 mg by mouth daily.    . benzonatate (TESSALON) 100 MG capsule Take 1 capsule (100 mg total) by mouth 2 (two) times daily as needed for cough. 20 capsule 0  . Camphor-Eucalyptus-Menthol (VICKS VAPORUB EX) Apply 1 application topically daily as needed (For congestion).    . cephALEXin (KEFLEX) 500 MG capsule Take 500 mg by mouth 4 (four) times daily. Take for 10 days    .  diphenhydramine-acetaminophen (TYLENOL PM) 25-500 MG TABS tablet Take 2 tablets by mouth at bedtime as needed (for sleep).    . Ensure Plus (ENSURE PLUS) LIQD Take 237 mLs by mouth 2 (two) times daily between meals.    . ferrous sulfate 325 (65 FE) MG tablet Take 325 mg by mouth daily with breakfast.    . fish oil-omega-3 fatty acids 1000 MG capsule Take 4 g by mouth every morning.     Marland Kitchen HYDROcodone-acetaminophen (NORCO/VICODIN) 5-325 MG tablet Take 1 tablet by mouth every 8 (eight) hours as needed for moderate pain or severe pain. 24 tablet 0  . Magnesium 250 MG TABS Take 500-750 mg by mouth See admin instructions. 750 mg in the morning, 500 mg in the evening    . Multiple Vitamin (MULTIVITAMIN) tablet Take 1 tablet by mouth daily.      Marland Kitchen omeprazole (PRILOSEC) 20 MG capsule Take 1 capsule (20 mg total) by mouth daily. 90 capsule 1  . potassium chloride (KLOR-CON) 10 MEQ tablet Take 2  tablets (20 mEq total) by mouth 2 (two) times daily. (Patient taking differently: Take 30 mEq by mouth 2 (two) times daily. )    . torsemide (DEMADEX) 20 MG tablet Take 3 tablets (60 mg total) by mouth 2 (two) times daily. Take an extra tablet as needed 560 tablet 3  . warfarin (COUMADIN) 10 MG tablet Take 10-15 mg by mouth See admin instructions. Takes 10 mg po QD; extra 5 mg on Sunday and Wednesday     . Wheat Dextrin (BENEFIBER) CHEW Chew 3 tablets by mouth daily.    . Ensure Max Protein (ENSURE MAX PROTEIN) LIQD Take 330 mLs (11 oz total) by mouth daily. (Patient not taking: Reported on 05/14/2019) 330 mL 12  . JANTOVEN 10 MG tablet Take 0.5 tablets (5 mg total) by mouth daily at 6 PM. Take above dose until follow up with your Coumadin clinic then follow their instructions for dosing. (Patient not taking: Reported on 05/14/2019) 30 tablet 0  . [DISCONTINUED] enoxaparin (LOVENOX) 150 MG/ML injection Inject 1 mL (150 mg total) into the skin every 12 (twelve) hours. 7 mL 0  . [DISCONTINUED] fondaparinux (ARIXTRA) 10 MG/0.8ML SOLN injection Inject 0.8 mLs (10 mg total) into the skin daily. 4 mL 1     Assessment: 75 y.o. male admitted with CHF, h/o Afib and MVR, to continue Coumadin.   INR trending down to 2.3 today after held doses and vitamin K. Hgb remains stable at 7 - receiving transfusion x2 2/6, plt 257. LDH down to 238.  With plans for possible RHC will continue to hold warfarin and will utilize heparin infusion with low goal.    Goal of Therapy:  Heparin level: 0.3-0.5 INR 2.5-3.5 Monitor platelets by anticoagulation protocol: Yes   Plan:  Continue to hold warfarin  Start heparin infusion at 1300 units/hr without bolus >> order heparin level in 8 hours Monitor INR, CBC, HL, and for s/sx of bleeding   Antonietta Jewel, PharmD, BCCCP Clinical Pharmacist  Phone: [phone]  Please check AMION for all [place] phone numbers After 10:00 PM, call [place] [phone] 05/18/2019 8:38  AM

## 2019-05-18 NOTE — Consult Note (Signed)
                                                                                 Consultation Note Date: 05/18/2019   Patient Name: Frank [name]  DOB: 03/11/1946  MRN: [id number]  Age / Sex: 74 y.o., male  PCP: Banks, Shannon R, MD Referring Physician: Gonfa, Taye T, MD  Reason for Consultation: Establishing goals of care and Psychosocial/spiritual support  HPI/Patient Profile: 74 y.o. male  with past medical history of chronic heart failure with preserved ejection fraction, HOCM s/p myomectomy, valvular disease with MVR, atrial fibrillation, pacemaker placement, CKD 4, and morbid obesity who was admitted on 05/13/2019 with massive fluid overload and LE edema attributed to right sided heart failure.  He was recently hospitaliized for sepsis due to group G strep bacteremia from right lower extremity nonpurulent cellulitis.    Clinical Assessment and Goals of Care:  I have reviewed medical records including EPIC notes, labs and imaging, received report from Dr. Bensimhon, examined the patient and met at bedside with the patient and his wife [name]  to discuss diagnosis prognosis, GOC, EOL wishes, disposition and options.  I introduced Palliative Medicine as specialized medical care for people living with serious illness. It focuses on providing relief from the symptoms and stress of a serious illness.   We discussed a brief life review of the patient. Sterlin appears very fatigued and defers to [name] to answer most of my questions.  Jakyle is from [place] and [name] is from [place].  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  Tevis has two children from a previous marriage (1 son and 1 dtr).  His loves include researching and building his family history as well as [place] - where they plan to build a house and live.  We discussed his current  illness and what it means in the larger context of his on-going co-morbidities.  Specifically we discussed the 35 years he has suffered with heart failure.  He has been a warrior continuing to fight.  We also discussed his acute on chronic renal failure.  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  [name] expressed her dis-satisfaction with the communication regarding his medical care including discharge instructions from the hospital and the lack of knowledge about his CKD.  She stated that after his last TEE she was told his heart was "fine".  She feels had communication about his CKD and heart been more "honest" then perhaps he would be in better condition now.   I listened with empathy.  I attempted to elicit values and goals of care important to the patient.  Trevionne expressed that if he ever lost interest in his family history and making plans for [place] and if he was unable to recognize or communicate with [name] he would rather not receive aggressive medical treatment.   He expressed that he had been intubated for his heart surgery and remained intubated for a period of time after that - he would rather not be intubated again.     Then he deferred to [name] for her input.  [name] has current medical knowledge and began to tear up and we discussed EOL interventions.    [name] expressed that at this point they are open to all treatment options.  However she understands that Bonifacio has a severe problem with his TV and that if he arrested resuscitation may not lead to a good outcome.    As we had been talking for quite a while and the conversation was intense we agreed to take a break for this evening and talk again tomorrow.  Questions and concerns were addressed.  The family was encouraged to call with questions or concerns.   Primary Decision Maker:  PATIENT.  Natan and [name] make decisions together.  She is his surrogate if he is unable to speak for himself.    SUMMARY OF RECOMMENDATIONS     Will increase senna  dose.  He has had difficulty sleeping at home and in the hospital - I will not attempt any symptom management for insomnia as he is too fragile.  PMT will follow up on 2/8 and attempt to communicate specifically and clearly about his current condition and health status.  Code Status/Advance Care Planning:  Full.  To be discussed further on 2/8   Symptom Management:   Per primary.  Additional Recommendations (Limitations, Scope, Preferences):  Full Scope Treatment  Psycho-social/Spiritual:   Desire for further Chaplaincy support: welcomed  Prognosis:  Less than 6 months due to advanced heart failure, incompetent tricuspid valve (not a surgical candidate), CKD 4, recent GI bleeding, and severe infection.    Discharge Planning: To Be Determined  Most likely home with Home Health and Palliative or possibly Hospice.      Primary Diagnoses: Present on Admission: . Pulmonary hypertension (HCC) . Essential hypertension . Hyperkalemia   I have reviewed the medical record, interviewed the patient and family, and examined the patient. The following aspects are pertinent.  Past Medical History:  Diagnosis Date  . Atrial arrhythmia    status post ablation in [place] with complication including damage to the aortic valve  . Atrial fibrillation ([facility])   . AV block, 1st degree   . Bradycardia   . Cluster headache ~ 1980   "went away after I started taking one of the heart RXs" (06/29/2017)  . Diastolic CHF, chronic ([facility])   . Diverticulosis   . History of gout   . HTN (hypertension)   . Hypertrophic cardiomyopathy ([facility])    s/p myoectomy  . Infection   . Internal hemorrhoids   . Morbid obesity ([facility])   . Morbid obesity with BMI of 45.0-49.9, adult ([facility])   . Pacemaker  -SJM   . Skin cancer    "burned off RUE X 1; cut off nose X 2" (06/29/2017)  . TIA (transient ischemic attack) 2001  . Tubulovillous adenoma    Social History   Socioeconomic History  . Marital status:  Married    Spouse name: Not on file  . Number of children: 1  . Years of education: Not on file  . Highest education level: Not on file  Occupational History  . Occupation: quality engineer, retired    Employer: [employer]  Tobacco Use  . Smoking status: Former Smoker    Years: 10.00    Types: Cigarettes, Pipe, Cigars    Quit date: 04/10/1977    Years since quitting: 42.1  . Smokeless tobacco: Never Used  Substance and Sexual Activity  . Alcohol   use: Yes    Alcohol/week: 2.0 standard drinks    Types: 1 Glasses of wine, 1 Cans of beer per week  . Drug use: No  . Sexual activity: Not Currently  Other Topics Concern  . Not on file  Social History Narrative   Married, one adopted son and one daughter. 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   2 caffeinated beverages daily   Social Determinants of Health   Financial Resource Strain:   . Difficulty of Paying Living Expenses: Not on file  Food Insecurity: No Food Insecurity  . Worried About Running Out of Food in the Last Year: Never true  . Ran Out of Food in the Last Year: Never true  Transportation Needs: No Transportation Needs  . Lack of Transportation (Medical): No  . Lack of Transportation (Non-Medical): No  Physical Activity:   . Days of Exercise per Week: Not on file  . Minutes of Exercise per Session: Not on file  Stress:   . Feeling of Stress : Not on file  Social Connections:   . Frequency of Communication with Friends and Family: Not on file  . Frequency of Social Gatherings with Friends and Family: Not on file  . Attends Religious Services: Not on file  . Active Member of Clubs or Organizations: Not on file  . Attends Club or Organization Meetings: Not on file  . Marital Status: Not on file   Family History  Problem Relation Age of Onset  . Heart disease Sister   . Uterine cancer Sister   . Obesity Sister        500+ lbs  . Heart disease Brother   . Heart  disease Sister   . Heart disease Father   . Colon cancer Father 68  . Crohn's disease Paternal Grandmother    Scheduled Meds: . sodium chloride   Intravenous Once  . sodium chloride   Intravenous Once  . acetaminophen  1,000 mg Oral Q8H  . acetaZOLAMIDE  250 mg Oral BID  . amoxicillin  500 mg Oral Q8H  . ferrous sulfate  325 mg Oral Q breakfast  . mouth rinse  15 mL Mouth Rinse BID  . pantoprazole  40 mg Oral BID  . potassium chloride  40 mEq Oral BID  . senna  2 tablet Oral QHS   Continuous Infusions: . furosemide (LASIX) infusion 30 mg/hr (05/18/19 1423)  . heparin 1,250 Units/hr (05/18/19 1833)  . milrinone 0.125 mcg/kg/min (05/18/19 0523)   PRN Meds:.guaiFENesin-dextromethorphan, lip balm, oxyCODONE, polyethylene glycol, sodium chloride flush Allergies  Allergen Reactions  . Warfarin And Related Other (See Comments)    COUMADIN or JANTOVEN BRAND ONLY** Per pt, was switched to generic in 2001 and had significantly decreased absorption > INR subtherapeutic> pt suffered a TIA and has been on brand name only since    Review of Systems insomnia, constipation, SOB, fatigue  Physical Exam  Well developed very pleasant, chronically ill appearing gentleman. Appears very fatigued and drifts off at times during our conversation Obese, sitting on edge of bed with increased work of breathing. LE with massive edema.  Wrapped.  Vital Signs: BP (!) 110/93 (BP Location: Left Arm)   Pulse 98   Temp 97.6 F (36.4 C) (Oral)   Resp 12   Ht 5' 11" (1.803 m)   Wt (!) 155.3 kg   SpO2 100%   BMI 47.76 kg/m  Pain Scale: 0-10   Pain Score: 0-No pain     SpO2: SpO2: 100 % O2 Device:SpO2: 100 % O2 Flow Rate: .O2 Flow Rate (L/min): 2 L/min  IO: Intake/output summary:   Intake/Output Summary (Last 24 hours) at 05/18/2019 1841 Last data filed at 05/18/2019 1500 Gross per 24 hour  Intake 118 ml  Output 2000 ml  Net -1882 ml    LBM: Last BM Date: 05/15/19 Baseline Weight: Weight: (!)  157.4 kg Most recent weight: Weight: (!) 155.3 kg     Palliative Assessment/Data: 40%     Time In: 2:00 Time Out: 3:30 Time Total: 90 min. Visit consisted of counseling and education dealing with the complex and emotionally intense issues surrounding the need for palliative care and symptom management in the setting of serious and potentially life-threatening illness. Greater than 50%  of this time was spent counseling and coordinating care related to the above assessment and plan.  Signed by: Florentina Jenny, PA-C Palliative Medicine Team.  Please contact Palliative Medicine Team phone at [phone] for questions and concerns.  For individual provider: See Shea Evans

## 2019-05-18 NOTE — Progress Notes (Signed)
Advanced Heart Failure Rounding Note   Subjective:    Milrinone started 2/5 for severe RV failure. Lasix gtt increased 20-> 25 yesterday. Urine output picked up to 2.6L. Weight down 2 pounds.   More lethargic today but arousable. Says no BM since Thursday.Denies SOB.   BUN/CR continue to improve rapidly with milrinone. Co-ox 76% (via midline PICC)  Hgb 6.9 -> 6.5 -> 1u -> 7.0 haptoglobin < 10. INR 2.3  LDH 300 -> 238   Objective:   Weight Range:  Vital Signs:   Temp:  [97.5 F (36.4 C)-97.9 F (36.6 C)] 97.6 F (36.4 C) (02/07 0618) Pulse Rate:  [53-97] 95 (02/07 0800) Resp:  [12-22] 12 (02/07 0800) BP: (84-119)/(43-57) 106/43 (02/07 0618) SpO2:  [96 %-100 %] 100 % (02/07 0800) Weight:  [155.3 kg] 155.3 kg (02/07 0618) Last BM Date: 05/15/19  Weight change: Filed Weights   05/16/19 0655 05/17/19 0554 05/18/19 0618  Weight: (!) 156.5 kg (!) 156.1 kg (!) 155.3 kg    Intake/Output:   Intake/Output Summary (Last 24 hours) at 05/18/2019 1325 Last data filed at 05/18/2019 0220 Gross per 24 hour  Intake 433 ml  Output 2050 ml  Net -1617 ml     Physical Exam: General:  Morbidly obese male sitting up in bed. Chronically ill appearing. More lethargic No resp difficulty HEENT: normal Neck: supple. JVP to ear Carotids 2+ bilat; no bruits. No lymphadenopathy or thryomegaly appreciated. Cor: PMI nondisplaced. Regular rate & rhythm. mech s1, 2/6 TR and AS Lungs: clear Abdomen: markedly obese soft, nontender, nondistended. No hepatosplenomegaly. No bruits or masses. Good bowel sounds. Extremities: no cyanosis, clubbing, rash, 2-3+ edema + UNNA  Neuro: lethargic but arousable and non-focal   Telemetry: AV paced 80-90s Personally reviewed   Labs: Basic Metabolic Panel: Recent Labs  Lab 05/15/19 0559 05/15/19 0559 05/15/19 1518 05/15/19 1518 05/16/19 0309 05/17/19 0614 05/18/19 0455  NA 131*  --  130*  --  132* 133* 137  K 5.5*  --  6.0*  --  4.9 3.2* 3.2*  CL  91*  --  89*  --  87* 91* 94*  CO2 29  --  28  --  29 30 29   GLUCOSE 125*  --  121*  --  113* 109* 108*  BUN 98*  --  98*  --  100* 91* 81*  CREATININE 2.91*  --  2.89*  --  2.84* 2.37* 2.06*  CALCIUM 8.7*   < > 8.6*   < > 8.8* 8.3* 8.3*  MG 3.1*  --   --   --  3.0* 2.9* 2.6*  PHOS 5.7*  --  5.3*  --  5.3* 5.1* 4.0   < > = values in this interval not displayed.    Liver Function Tests: Recent Labs  Lab 05/14/19 0435 05/14/19 1210 05/15/19 0559 05/15/19 1518 05/16/19 0309 05/17/19 0614 05/18/19 0455  AST 40  --   --   --   --   --   --   ALT 15  --   --   --   --   --   --   ALKPHOS 140*  --   --   --   --   --   --   BILITOT 1.4*  --   --   --   --   --   --   PROT 7.2  --   --   --   --   --   --  ALBUMIN 2.5*   < > 2.5* 2.5* 2.4* 2.3* 2.6*   < > = values in this interval not displayed.   No results for input(s): LIPASE, AMYLASE in the last 168 hours. No results for input(s): AMMONIA in the last 168 hours.  CBC: Recent Labs  Lab 05/14/19 0435 05/14/19 0435 05/14/19 0819 05/14/19 0819 05/15/19 0559 05/15/19 1109 05/16/19 0309 05/16/19 1641 05/17/19 0614 05/17/19 1644 05/18/19 0455  WBC 8.2  --  7.7  --  8.1  --   --   --  7.0  --  8.6  NEUTROABS 5.5  --   --   --   --   --   --   --   --   --   --   HGB 6.5*   < > 6.4*   < > 7.2*   < > 7.1* 6.9* 6.5* 7.0* 7.0*  HCT 21.4*   < > 21.9*   < > 23.6*   < > 23.1* 22.8* 21.9* 23.0* 22.7*  MCV 94.7  --  95.6  --  94.4  --   --   --  97.3  --  95.0  PLT 266  --  257  --  256  --   --   --  233  --  257   < > = values in this interval not displayed.    Cardiac Enzymes: No results for input(s): CKTOTAL, CKMB, CKMBINDEX, TROPONINI in the last 168 hours.  BNP: BNP (last 3 results) Recent Labs    03/16/19 2359 05/13/19 1930  BNP 105.7* 163.6*    ProBNP (last 3 results) No results for input(s): PROBNP in the last 8760 hours.    Other results:  Imaging: No results found.   Medications:     Scheduled  Medications: . sodium chloride   Intravenous Once  . sodium chloride   Intravenous Once  . acetaminophen  1,000 mg Oral Q8H  . acetaZOLAMIDE  250 mg Oral BID  . amoxicillin  500 mg Oral Q8H  . ferrous sulfate  325 mg Oral Q breakfast  . mouth rinse  15 mL Mouth Rinse BID  . pantoprazole  40 mg Oral BID  . potassium chloride  30 mEq Oral Q4H  . potassium chloride  40 mEq Oral BID    Infusions: . furosemide (LASIX) infusion 25 mg/hr (05/18/19 0225)  . heparin 1,300 Units/hr (05/18/19 0941)  . milrinone 0.125 mcg/kg/min (05/18/19 0523)    PRN Medications: lip balm, oxyCODONE, polyethylene glycol, sodium chloride flush   Assessment/Plan:   1. 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  - He is massively volume overloaded with severe R-sided HF in the setting of wide open TR (which I suspect is due to torn TV) - Was not responding well to lasix gtt at 20 with diamox. Milrinone added 2/5 - Co-ox 76% on milrinone 0.125.  - Renal function improving with milrinone  Diuresis picking up but still massively overloaded.  - Increase lasix gtt to 30. Continue diamox Will give a dose of metoalzone  2. 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  - not candidate for surgery - no change  3. AKI on CKD  -Baseline creatinine < 2.0  - Was up to 2.8. Milrinone aded 2/5. Now 2.3 -> 2.06 - suspect cardiorenal.  - continue milrinone  4. Anemia  - GI consulted.  - Had colonoscopy in 02/2019 with ext/int hemmorhoids.  -  LDH mildly elevated at 300 -> 238. Haptoglobin <10. Suspect component of valvular hemolysis but do not feel that this is high-grade process to explain all of his anemia.  - GI and primary team managing. Agree with trasnfusion  - INR 2.3. Will start heparin givem mech MVR. Discussed dosing with PharmD personally. - Holding on repeat endoscopy for now  5. Mechanical MVR with moderate MS - INR down to 2.3   - Coumadin has been on  hold. Start heparin  - See discussion abve  6. Moderate AS - medical therapy   7. Obesity  Body mass index is 47.84 kg/m.  8. Hypokalemia - supp  9. Lethargy - check ABG and ammonia.   Length of Stay: 5   Glori Bickers MD 05/18/2019, 1:25 PM  Advanced Heart Failure Team Pager [phone] (M-F; [place])  Please contact [facility] for night-coverage after hours (4p -7a ) and weekends on [email or web address]

## 2019-05-18 NOTE — Progress Notes (Signed)
     [facility] Progress Note  CC:  Anemia, occult positive stools.   Subjective:  He is sitting up at the bedside eating lunch. Family present. No abdominal pain. Last BM which was melenic  was on Thurs 2/4. No melena or rectal bleeding since then.    Objective:  Vital signs in last 24 hours: Temp:  [97.6 F (36.4 C)-97.9 F (36.6 C)] 97.6 F (36.4 C) (02/07 0618) Pulse Rate:  [92-97] 95 (02/07 0800) Resp:  [12-22] 12 (02/07 0800) BP: (84-119)/(43-57) 106/43 (02/07 0618) SpO2:  [96 %-100 %] 100 % (02/07 0800) Weight:  [155.3 kg] 155.3 kg (02/07 0618) Last BM Date: 05/15/19 General:  Morbidly obese male alert in NAD. Heart:  RRR, 3/6 systolic murmur.  Pulm:  Fine bibasilar crackles bilaterally.  Abdomen: Grossly obese, soft, nontender.  Extremities:  3 + pitting edema bilateral lower extremities.  Neurologic:  Alert and  oriented x4;  grossly normal neurologically. Psych:  Alert and cooperative. Normal mood and affect.  Intake/Output from previous day: 02/06 0701 - 02/07 0700 In: 1343.2 [P.O.:558; I.V.:470.2; Blood:315] Out: 2650 [Urine:2650] Intake/Output this shift: No intake/output data recorded.  Lab Results: Recent Labs    05/17/19 0614 05/17/19 1644 05/18/19 0455  WBC 7.0  --  8.6  HGB 6.5* 7.0* 7.0*  HCT 21.9* 23.0* 22.7*  PLT 233  --  257   BMET Recent Labs    05/16/19 0309 05/17/19 0614 05/18/19 0455  NA 132* 133* 137  K 4.9 3.2* 3.2*  CL 87* 91* 94*  CO2 29 30 29   GLUCOSE 113* 109* 108*  BUN 100* 91* 81*  CREATININE 2.84* 2.37* 2.06*  CALCIUM 8.8* 8.3* 8.3*   LFT Recent Labs    05/18/19 0455  ALBUMIN 2.6*   PT/INR Recent Labs    05/17/19 0614 05/18/19 0455  LABPROT 30.0* 25.5*  INR 2.9* 2.3*   Hepatitis Panel No results for input(s): HEPBSAG, HCVAB, HEPAIGM, HEPBIGM in the last 72 hours.  No results found.  Assessment / Plan:  60. 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 Admission Hg 6.5. He received a total of 4 units PRBs since admission. Feraheme x 1 on 2/4. Admission INR 6.6 down to 2.3.  -Repeat H/H and INR in am -Continue PPI b id. -No plans for endoscopic evaluation for now, purse if patient becomes transfusion  dependent and when heart failure is better controlled.  -Heparin gtt was restarted today at 1300 u/hr -Continue to monitor for active GI bleeding/melena.   2. History of colon polyps. Last colonoscopy 02/2019 showed diverticulosis and hemorrhoids.   3. AKI. CKD stage III.  Further recommendations per Dr.  Rush Landmark   Principal Problem:   Acute CHF (congestive heart failure) ([place]) Active Problems:   Essential hypertension   Acute on chronic diastolic heart failure (HCC)   S/P mitral valve replacement-Mechanical   NICM (nonischemic cardiomyopathy) ([place])   Pulmonary hypertension ([place])   Acute renal failure superimposed on stage 3b chronic kidney disease (HCC)   Hyperkalemia   Acute on chronic right heart failure ([place])   Status post biventricular pacemaker   Anemia   Heme positive stool   Anticoagulated     LOS: 5 days   Noralyn Pick  05/18/2019, 2:36 PM

## 2019-05-18 NOTE — Progress Notes (Signed)
Patient refusing ABG at this time. Patient is very awake, alert and oriented. Wife stated that when MD was in room earlier, that this was not the case. He wishes to hold off. No distress noted, SAT 100% on RA. Will page MD

## 2019-05-18 NOTE — Plan of Care (Signed)
  Problem: Clinical Measurements: Goal: Ability to maintain clinical measurements within normal limits will improve Outcome: Progressing Goal: Will remain free from infection Outcome: Progressing   

## 2019-05-18 NOTE — Progress Notes (Signed)
PROGRESS NOTE  Frank [name]:[id number] DOB: January 15, 1946   PCP: [name], MD  Patient is from: Home.  Lives with his wife.  DOA: 05/13/2019 LOS: 5  Brief Narrative / Interim history: 74 year old male with HCM s/p myomectomy, mechanical MVR/AVR on warfarin, PPM, A. Fib, lymphedema, CKD-4, debility, morbid obesity, recurrent hospitalization for sepsis secondary to Streptococcus from RLE cellulitis presenting with progressive dyspnea and weight gain.   In ED, hemodynamically stable except for brief tachycardia to 124.  Saturation 98% on room air. CXR consistent with CHF.  K7.0. Cr 2.67 (baseline~1.8).  INR 7.2.  Hgb 7.2 (baseline about 8.0).  EKG revealed paced rhythm.  COVID-19 negative.  Received IV Lasix 120 mg once and was started on Lasix drip by cardiologist.  Also received bicarb, IV insulin and D50, and admitted for CHF exacerbation, AKI, hyperkalemia, supratherapeutic INR and anemia.  Cardiology consulted and started on Lasix drip.  Hypokalemia resolved with Lokelma and IV Lasix.  INR improved after vitamin K.  Hemoglobin dropped requiring intermittent blood transfusions.  FOBT positive.  GI consulted.  Subjective: No major events overnight or this morning.  He claims he had a panic attack after a nightmare last night.  He says he is very hungry and not happy that the breakfast is not there yet.  Denies chest pain or dyspnea.  He is adamant about CPAP at night.  He says he never wore and will never.  UOP about 2.65 L.  Creatinine improved.  INR 2.3.  Hgb stable at 7.0.  Objective: Vitals:   05/17/19 1402 05/17/19 2003 05/17/19 2154 05/18/19 0618  BP: (!) 112/53 (!) 84/57 (!) 119/46 (!) 106/43  Pulse: (!) 59 96 92 97  Resp: 14 18 (!) 22 13  Temp: (!) 97.5 F (36.4 C) 97.9 F (36.6 C)  97.6 F (36.4 C)  TempSrc: Oral Oral  Axillary  SpO2: 100% 96%  100%  Weight:    (!) 155.3 kg  Height:    5\' 11"  (1.803 m)    Intake/Output Summary (Last 24 hours) at 05/18/2019  1226 Last data filed at 05/18/2019 0220 Gross per 24 hour  Intake 433 ml  Output 2050 ml  Net -1617 ml   Filed Weights   05/16/19 0655 05/17/19 0554 05/18/19 0618  Weight: (!) 156.5 kg (!) 156.1 kg (!) 155.3 kg    Examination:  GENERAL: No apparent distress.  Sitting on the edge of the bed. HEENT: MMM.  Vision and hearing grossly intact.  NECK: Supple.  No apparent JVD but difficult to assess.  RESP:  No IWOB. Good air movement bilaterally. CVS:  RRR.  2/6 SEM over RUSB and LUSB. ABD/GI/GU: Bowel sounds present. Soft. Non tender.  MSK/EXT: Unna boots over bilateral lower extremities.  Edema improved. SKIN: Unna boot over bilateral lower extremities. NEURO: Awake, alert and oriented appropriately.  No apparent focal neuro deficit. PSYCH: Calm. Normal affect.  Procedures:  None  Assessment & Plan: Acute on chronic diastolic CHF: Echo in 31/54 with EF of 55 to 60%, moderate LVH, moderate LAE and severe RAE, moderate to severe TVR.  Had significant fluid overload with massive lymphedema.  Presented with dyspnea and weight gain.  CXR concerning for CHF. Had 2.7 UOP in the last 24 hours.  Renal function improving. -Advanced HF team managing- IV Lasix drip, Diamox and milrinone -Monitor fluid status, renal function and electrolytes -Sodium and fluid restrictions.  AKI on CKD-4/azotemia: Baseline  Cr 1.8-2.0 > 2.67 (admit) >> 2.84> 2.37> 2.06.  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  Improving with diuretics as above -Continue monitoring.  Acute on chronic blood loss anemia in in the setting of supratherapeutic INR: Hgb ~8.0 (baseline)> 7.2 (admit)> 6.5>2u>7.2> 7.1> 6.9>1u> 6.5>1u> 7.0.  FOBT positive.  INR 2.3.  LDH slightly elevated.  Haptoglobin low at 10.  Concern about valvular hemolysis.  -Monitor H&H -Continue Protonix to IV every 12 hours. -GI following-EGD when stable from cardiac standpoint-okay to restart heparin given mechanical valve  Mechanical MVR/AVR: INR subtherapeutic  today Severe TR: Concern for partial TV tear on TEE in 03/2019.  Not a candidate for surgery. Supratherapeutic INR: INR 7.2> 7.6> vitamin K>> 4.8> 2.3 -Warfarin on hold. -Okay to start heparin per GI  Paroxysmal atrial fibrillation: NSR.  Not on rate or rhythm control. -Anticoagulation as above  Hyponatremia: Likely due to renal failure.  Relatively stable. -Continue monitoring  Hypokalemia: Likely due to diuretics. -Replenish and recheck  At risk for sleep apnea/OHS: Body mass index is 47.76 kg/m.  Refuses nightly CPAP. -Discontinued   Recent hospitalization for group G Streptococcus bacteremia from RLE cellulitis in patient with lymphedema -Completed antibiotic course with penicillin G followed by oral Zyvox for a total of 14 days through 12/22. -Evaluated by ID, Dr. Tommy Medal on 1/11 and prescribed Augmentin for 2 more weeks with refill. -Blood cultures negative x2 this admission. -Received amoxicillin 2/3-2/6.  Monitor off antibiotics.   Chronic pain:  -Continue scheduled Tylenol with as needed oxycodone.   Generalized weakness/debility -PT/OT-recommended CIR.  Lymphedema/possible right LE cellulitis: Improving. -Diuretics as above. -Appreciate WOC  Morbid obesity: Body mass index is 47.76 kg/m. -Encourage lifestyle change to lose weight  Goal of care: Patient is full code but interested in palliative care input. -Palliative care consulted        DVT prophylaxis: None.  INR supratherapeutic. Code Status: Full code-confirmed with patient. Family Communication: Patient and RN.  No family member at bedside today.  Discharge barrier: Multiple medical issues as above Patient is from: Home Final disposition: Likely CIR once medically stable.  Consultants: Cardiology   Microbiology summarized: [id number] and influenza PCR negative. Blood cultures negative so far  Sch Meds:  Scheduled Meds: . sodium chloride   Intravenous Once  . sodium chloride   Intravenous  Once  . acetaminophen  1,000 mg Oral Q8H  . acetaZOLAMIDE  250 mg Oral BID  . amoxicillin  500 mg Oral Q8H  . ferrous sulfate  325 mg Oral Q breakfast  . mouth rinse  15 mL Mouth Rinse BID  . pantoprazole  40 mg Oral BID  . potassium chloride  30 mEq Oral Q4H  . potassium chloride  40 mEq Oral BID   Continuous Infusions: . furosemide (LASIX) infusion 25 mg/hr (05/18/19 0225)  . heparin 1,300 Units/hr (05/18/19 0941)  . milrinone 0.125 mcg/kg/min (05/18/19 0523)   PRN Meds:.lip balm, oxyCODONE, polyethylene glycol, sodium chloride flush  Antimicrobials: Anti-infectives (From admission, onward)   Start     Dose/Rate Route Frequency Ordered Stop   05/14/19 1700  amoxicillin (AMOXIL) capsule 500 mg     500 mg Oral Every 8 hours 05/14/19 1620         I have personally reviewed the following labs and images: CBC: Recent Labs  Lab 05/14/19 0435 05/14/19 0435 05/14/19 0819 05/14/19 0819 05/15/19 0559 05/15/19 1109 05/16/19 0309 05/16/19 1641 05/17/19 0614 05/17/19 1644 05/18/19 0455  WBC 8.2  --  7.7  --  8.1  --   --   --  7.0  --  8.6  NEUTROABS 5.5  --   --   --   --   --   --   --   --   --   --   HGB 6.5*   < > 6.4*   < > 7.2*   < > 7.1* 6.9* 6.5* 7.0* 7.0*  HCT 21.4*   < > 21.9*   < > 23.6*   < > 23.1* 22.8* 21.9* 23.0* 22.7*  MCV 94.7  --  95.6  --  94.4  --   --   --  97.3  --  95.0  PLT 266  --  257  --  256  --   --   --  233  --  257   < > = values in this interval not displayed.   BMP &GFR Recent Labs  Lab 05/15/19 0559 05/15/19 1518 05/16/19 0309 05/17/19 0614 05/18/19 0455  NA 131* 130* 132* 133* 137  K 5.5* 6.0* 4.9 3.2* 3.2*  CL 91* 89* 87* 91* 94*  CO2 29 28 29 30 29   GLUCOSE 125* 121* 113* 109* 108*  BUN 98* 98* 100* 91* 81*  CREATININE 2.91* 2.89* 2.84* 2.37* 2.06*  CALCIUM 8.7* 8.6* 8.8* 8.3* 8.3*  MG 3.1*  --  3.0* 2.9* 2.6*  PHOS 5.7* 5.3* 5.3* 5.1* 4.0   Estimated Creatinine Clearance: 49.9 mL/min (A) (by C-G formula based on SCr of  2.06 mg/dL (H)). Liver & Pancreas: Recent Labs  Lab 05/14/19 0435 05/14/19 1210 05/15/19 0559 05/15/19 1518 05/16/19 0309 05/17/19 2694 05/18/19 0455  AST 40  --   --   --   --   --   --   ALT 15  --   --   --   --   --   --   ALKPHOS 140*  --   --   --   --   --   --   BILITOT 1.4*  --   --   --   --   --   --   PROT 7.2  --   --   --   --   --   --   ALBUMIN 2.5*   < > 2.5* 2.5* 2.4* 2.3* 2.6*   < > = values in this interval not displayed.   No results for input(s): LIPASE, AMYLASE in the last 168 hours. No results for input(s): AMMONIA in the last 168 hours. Diabetic: No results for input(s): HGBA1C in the last 72 hours. Recent Labs  Lab 05/14/19 1128  GLUCAP 96   Cardiac Enzymes: No results for input(s): CKTOTAL, CKMB, CKMBINDEX, TROPONINI in the last 168 hours. No results for input(s): PROBNP in the last 8760 hours. Coagulation Profile: Recent Labs  Lab 05/15/19 0559 05/15/19 1518 05/16/19 0309 05/17/19 0614 05/18/19 0455  INR 6.4* 6.6* 4.8* 2.9* 2.3*   Thyroid Function Tests: No results for input(s): TSH, T4TOTAL, FREET4, T3FREE, THYROIDAB in the last 72 hours. Lipid Profile: No results for input(s): CHOL, HDL, LDLCALC, TRIG, CHOLHDL, LDLDIRECT in the last 72 hours. Anemia Panel: No results for input(s): VITAMINB12, FOLATE, FERRITIN, TIBC, IRON, RETICCTPCT in the last 72 hours. Urine analysis:    Component Value Date/Time   COLORURINE YELLOW 03/17/2019 0054   APPEARANCEUR CLOUDY (A) 03/17/2019 0054   LABSPEC 1.006 03/17/2019 0054   PHURINE 6.0 03/17/2019 0054   GLUCOSEU NEGATIVE 03/17/2019 0054   HGBUR SMALL (A) 03/17/2019 0054   BILIRUBINUR NEGATIVE 03/17/2019 0054   KETONESUR NEGATIVE 03/17/2019  [place] (A) 03/17/2019 0054   NITRITE NEGATIVE 03/17/2019 0054   LEUKOCYTESUR LARGE (A) 03/17/2019 0054   Sepsis Labs: Invalid input(s): PROCALCITONIN, [place]  Microbiology: Recent Results (from the past 240 hour(s))  Respiratory  Panel by RT PCR (Flu A&B, Covid) - Nasopharyngeal Swab     Status: None   Collection Time: 05/13/19  8:19 PM   Specimen: Nasopharyngeal Swab  Result Value Ref Range Status   SARS Coronavirus 2 by RT PCR NEGATIVE NEGATIVE Final    Comment: (NOTE) SARS-CoV-2 target nucleic acids are NOT DETECTED. The SARS-CoV-2 RNA is generally detectable in upper respiratoy specimens during the acute phase of infection. The lowest concentration of SARS-CoV-2 viral copies this assay can detect is 131 copies/mL. A negative result does not preclude SARS-Cov-2 infection and should not be used as the sole basis for treatment or other patient management decisions. A negative result may occur with  improper specimen collection/handling, submission of specimen other than nasopharyngeal swab, presence of viral mutation(s) within the areas targeted by this assay, and inadequate number of viral copies (<131 copies/mL). A negative result must be combined with clinical observations, patient history, and epidemiological information. The expected result is Negative. Fact Sheet for Patients:  PinkCheek.be Fact Sheet for Healthcare Providers:  GravelBags.it This test is not yet ap proved or cleared by the [place] FDA and  has been authorized for detection and/or diagnosis of SARS-CoV-2 by FDA under an Emergency Use Authorization (EUA). This EUA will remain  in effect (meaning this test can be used) for the duration of the COVID-19 declaration under Section 564(b)(1) of the Act, 21 U.S.C. section 360bbb-3(b)(1), unless the authorization is terminated or revoked sooner.    Influenza A by PCR NEGATIVE NEGATIVE Final   Influenza B by PCR NEGATIVE NEGATIVE Final    Comment: (NOTE) The Xpert Xpress SARS-CoV-2/FLU/RSV assay is intended as an aid in  the diagnosis of influenza from Nasopharyngeal swab specimens and  should not be used as a sole basis for  treatment. Nasal washings and  aspirates are unacceptable for Xpert Xpress SARS-CoV-2/FLU/RSV  testing. Fact Sheet for Patients: PinkCheek.be Fact Sheet for Healthcare Providers: GravelBags.it This test is not yet approved or cleared by the [place] FDA and  has been authorized for detection and/or diagnosis of SARS-CoV-2 by  FDA under an Emergency Use Authorization (EUA). This EUA will remain  in effect (meaning this test can be used) for the duration of the  Covid-19 declaration under Section 564(b)(1) of the Act, 21  U.S.C. section 360bbb-3(b)(1), unless the authorization is  terminated or revoked. Performed at [facility], [place] [address] [place], [place] [address]   Culture, blood (routine x 2)     Status: None (Preliminary result)   Collection Time: 05/14/19  2:28 PM   Specimen: BLOOD RIGHT HAND  Result Value Ref Range Status   Specimen Description BLOOD RIGHT HAND  Final   Special Requests   Final    BOTTLES DRAWN AEROBIC ONLY Blood Culture adequate volume   Culture   Final    NO GROWTH 4 DAYS Performed at [facility], [place] [address] [place], [place] [address]    Report Status PENDING  Incomplete  Culture, blood (routine x 2)     Status: None (Preliminary result)   Collection Time: 05/14/19  2:34 PM   Specimen: BLOOD LEFT HAND  Result Value Ref Range Status   Specimen Description BLOOD LEFT HAND  Final  Special Requests   Final    BOTTLES DRAWN AEROBIC ONLY Blood Culture adequate volume   Culture   Final    NO GROWTH 4 DAYS Performed at [facility], [place] [address], [place], [place] [address]    Report Status PENDING  Incomplete    Radiology Studies: No results found.  Ansen Sayegh T. Oldham  If 7PM-7AM, please contact night-coverage [email or web address] Password [facility] 05/18/2019, 12:26 PM

## 2019-05-19 ENCOUNTER — Encounter: Payer: Self-pay | Admitting: *Deleted

## 2019-05-19 DIAGNOSIS — K5901 Slow transit constipation: Secondary | ICD-10-CM

## 2019-05-19 DIAGNOSIS — E876 Hypokalemia: Secondary | ICD-10-CM

## 2019-05-19 DIAGNOSIS — I5041 Acute combined systolic (congestive) and diastolic (congestive) heart failure: Secondary | ICD-10-CM

## 2019-05-19 DIAGNOSIS — Z515 Encounter for palliative care: Secondary | ICD-10-CM

## 2019-05-19 LAB — MAGNESIUM: Magnesium: 2.4 mg/dL (ref 1.7–2.4)

## 2019-05-19 LAB — RENAL FUNCTION PANEL
Albumin: 2.6 g/dL — ABNORMAL LOW (ref 3.5–5.0)
Anion gap: 16 — ABNORMAL HIGH (ref 5–15)
BUN: 69 mg/dL — ABNORMAL HIGH (ref 8–23)
CO2: 32 mmol/L (ref 22–32)
Calcium: 8.7 mg/dL — ABNORMAL LOW (ref 8.9–10.3)
Chloride: 91 mmol/L — ABNORMAL LOW (ref 98–111)
Creatinine, Ser: 1.91 mg/dL — ABNORMAL HIGH (ref 0.61–1.24)
GFR calc Af Amer: 40 mL/min — ABNORMAL LOW (ref >60)
GFR calc non Af Amer: 34 mL/min — ABNORMAL LOW (ref >60)
Glucose, Bld: 109 mg/dL — ABNORMAL HIGH (ref 70–99)
Phosphorus: 4.1 mg/dL (ref 2.5–4.6)
Potassium: 2.9 mmol/L — ABNORMAL LOW (ref 3.5–5.1)
Sodium: 139 mmol/L (ref 135–145)

## 2019-05-19 LAB — COOXEMETRY PANEL
Carboxyhemoglobin: 2.7 % — ABNORMAL HIGH (ref 0.5–1.5)
Methemoglobin: 1.2 % (ref 0.0–1.5)
O2 Saturation: 65.3 %
Total hemoglobin: 7.3 g/dL — ABNORMAL LOW (ref 12.0–16.0)

## 2019-05-19 LAB — CBC
HCT: 23.5 % — ABNORMAL LOW (ref 39.0–52.0)
Hemoglobin: 7.3 g/dL — ABNORMAL LOW (ref 13.0–17.0)
MCH: 29.9 pg (ref 26.0–34.0)
MCHC: 31.1 g/dL (ref 30.0–36.0)
MCV: 96.3 fL (ref 80.0–100.0)
Platelets: 260 10*3/uL (ref 150–400)
RBC: 2.44 MIL/uL — ABNORMAL LOW (ref 4.22–5.81)
RDW: 18.4 % — ABNORMAL HIGH (ref 11.5–15.5)
WBC: 7.9 10*3/uL (ref 4.0–10.5)
nRBC: 0.6 % — ABNORMAL HIGH (ref 0.0–0.2)

## 2019-05-19 LAB — HEPARIN LEVEL (UNFRACTIONATED): Heparin Unfractionated: 0.23 IU/mL — ABNORMAL LOW (ref 0.30–0.70)

## 2019-05-19 LAB — PROTIME-INR
INR: 2.3 — ABNORMAL HIGH (ref 0.8–1.2)
Prothrombin Time: 25.4 seconds — ABNORMAL HIGH (ref 11.4–15.2)

## 2019-05-19 MED ORDER — LACTULOSE 10 GM/15ML PO SOLN
30.0000 g | Freq: Three times a day (TID) | ORAL | Status: DC
  Administered 2019-05-19 – 2019-05-20 (×3): 30 g via ORAL
  Filled 2019-05-19 (×3): qty 45

## 2019-05-19 MED ORDER — FLEET ENEMA 7-19 GM/118ML RE ENEM
1.0000 | ENEMA | Freq: Once | RECTAL | Status: AC
  Administered 2019-05-19: 1 via RECTAL

## 2019-05-19 MED ORDER — POTASSIUM CHLORIDE CRYS ER 10 MEQ PO TBCR
40.0000 meq | EXTENDED_RELEASE_TABLET | Freq: Three times a day (TID) | ORAL | Status: DC
  Administered 2019-05-19 – 2019-05-20 (×3): 40 meq via ORAL
  Filled 2019-05-19 (×4): qty 4

## 2019-05-19 MED ORDER — POTASSIUM CHLORIDE CRYS ER 20 MEQ PO TBCR: 40.0000 meq | EXTENDED_RELEASE_TABLET | Freq: Three times a day (TID) | ORAL | Status: DC

## 2019-05-19 MED ORDER — POTASSIUM CHLORIDE CRYS ER 10 MEQ PO TBCR
40.0000 meq | EXTENDED_RELEASE_TABLET | Freq: Two times a day (BID) | ORAL | Status: DC
  Administered 2019-05-19: 40 meq via ORAL
  Filled 2019-05-19: qty 2

## 2019-05-19 NOTE — Progress Notes (Signed)
[place] for Coumadin / heparin  Indication: Atrial fibrillation and mechanical heart valve  Allergies  Allergen Reactions  . Warfarin And Related Other (See Comments)    COUMADIN or JANTOVEN BRAND ONLY** Per pt, was switched to generic in 2001 and had significantly decreased absorption > INR subtherapeutic> pt suffered a TIA and has been on brand name only since     Patient Measurements: Height: 5\' 11"  (180.3 cm) Weight: (!) 341 lb 7.9 oz (154.9 kg) IBW/kg (Calculated) : 75.3  Vital Signs: Temp: 98.2 F (36.8 C) (02/08 0452) Temp Source: Axillary (02/08 0452) BP: 119/68 (02/08 0452) Pulse Rate: 65 (02/08 0452)  Labs: Recent Labs    05/17/19 0614 05/17/19 1644 05/18/19 0455 05/18/19 0455 05/18/19 1641 05/19/19 0500  HGB 6.5*   < > 7.0*   < > 7.5* 7.3*  HCT 21.9*   < > 22.7*  --  25.0* 23.5*  PLT 233  --  257  --   --  260  LABPROT 30.0*  --  25.5*  --   --  25.4*  INR 2.9*  --  2.3*  --   --  2.3*  HEPARINUNFRC  --   --   --   --  0.51 0.23*  CREATININE 2.37*  --  2.06*  --   --  1.91*   < > = values in this interval not displayed.    Estimated Creatinine Clearance: 53.7 mL/min (A) (by C-G formula based on SCr of 1.91 mg/dL (H)).    Assessment: 74 y.o. male admitted with CHF, h/o Afib and mechMVR, holding warfarin for possible procedures, on heparin with lower goal for possible recent GIB , h/h 7/23.    INR trending down to 2.3 today after held doses and vitamin K. Heaprin drip 1250 uts.hr  HL 0.2. Per RN, last melena episode was 2/5 and no issues with heparin infusion.  Goal of Therapy:  Heparin level: 0.3-0.5 INR 2.5-3.5 Monitor platelets by anticoagulation protocol: Yes   Plan:  Continue to hold warfarin  Increase  heparin to 1300 units/hr  Monitor CBC, HL, and for s/sx of bleeding   [name] Pharm.D. CPP, BCPS Clinical Pharmacist [phone] 05/19/2019 3:25 PM     Please check AMION for all [facility]  phone numbers After 10:00 PM, call [place] [phone]

## 2019-05-19 NOTE — Care Management Important Message (Signed)
Important Message  Patient Details  Name: Frank [name] MRN: [id number] Date of Birth: 11-23-1945   Medicare Important Message Given:  Yes     Shelda Altes 05/19/2019, 4:04 PM

## 2019-05-19 NOTE — Progress Notes (Signed)
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  Pt with cellulitis of R LE, CHF, and questionable infection.   OT comments  Patient having discomfort today due to constipation. Encourage/educate patient on importance of mobility to facilitate bowel movements. With increased time, use of bed rails and min x2 patient come to sitting position. Patient emotional reporting soreness d/t constipation, allow for rest break and ice chips. Attempt x2 with cues for body mechanics to stand from edge of bed and transfer to bedside commode however pt unable to clear his hips. Patient requesting to sit at edge of bed longer and have ice chips, RN made aware.   O2 at EOB reading in 70s however poor wave form. Repositioned patient's hand and donned 2L O2 with readings of 97-98% at EOB.    Follow Up Recommendations  CIR    Equipment Recommendations  Other (comment)(defer to next venue)       Precautions / Restrictions Precautions Precautions: Fall Restrictions Weight Bearing Restrictions: No       Mobility Bed Mobility Overal bed mobility: Needs Assistance Bed Mobility: Supine to Sit     Supine to sit: Min assist;+2 for physical assistance     General bed mobility comments: for LE management and to elevate trunk to EOB; use of bed features and rails, increased time and effort  Transfers Overall transfer level: Needs assistance Equipment used: Rolling walker (2 wheeled) Transfers: Sit to/from Stand Sit to Stand: Total assist;+2 physical assistance         General transfer comment: attempted x2 with  2-person totalA, did not initiate to assist in stand and thus unable to even clear hips from bed    Balance Overall balance assessment: Needs assistance Sitting-balance support: Feet supported;Bilateral upper extremity supported Sitting balance-Leahy Scale: Good     Standing balance support: Bilateral upper extremity supported;During functional activity Standing balance-Leahy Scale: Poor Standing balance comment: unable to come to standing this session                           ADL either performed or assessed with clinical judgement   ADL Overall ADL's : Needs assistance/impaired Eating/Feeding: Set up;Sitting Eating/Feeding Details (indicate cue type and reason): eat ice chips                 Lower Body Dressing: Total assistance;Bed level Lower Body Dressing Details (indicate cue type and reason): donning socks   Toilet Transfer Details (indicate cue type and reason): unable this date, attempted x2 to stand from edge of bed to sit on commode however patient unable to power with with assist x2           General ADL Comments: decreased strength, activity tolerance this session requiring increased assistance                Cognition Arousal/Alertness: Awake/alert Behavior During Therapy: WFL for tasks assessed/performed;Flat affect Overall Cognitive Status: Within Functional Limits for tasks assessed  General Comments: patient more emotional due to discomfort from constipation, requiring increased cues to redirect to task.               General Comments donned 2L O2 seated EOB, reading 98%. without O2 reading in 70s however did not have good wave form/repositioned patient's hand    Pertinent Vitals/ Pain       Pain Assessment: Faces Faces Pain Scale: Hurts even more Pain Location: soreness from "constipation" Pain Descriptors / Indicators: Aching;Sore Pain Intervention(s): Limited activity within  patient's tolerance;Monitored during session         Frequency  Min 2X/week        Progress Toward Goals  OT Goals(current goals can now be found in the care plan section)  Progress towards OT goals: Progressing toward goals  Acute Rehab OT Goals Patient Stated Goal: to feel stronger and to sleep OT Goal Formulation: With patient Time For Goal Achievement: 05/29/19 Potential to Achieve Goals: Good ADL Goals Pt Will Perform Grooming: with supervision;standing Pt Will Perform Lower Body Bathing: with supervision;with adaptive equipment;sit to/from stand Pt Will Perform Lower Body Dressing: with min assist;with adaptive equipment;sit to/from stand Pt Will Transfer to Toilet: with supervision;ambulating  Plan Discharge plan remains appropriate    Co-evaluation    PT/OT/SLP Co-Evaluation/Treatment: Yes Reason for Co-Treatment: For patient/therapist safety;To address functional/ADL transfers PT goals addressed during session: Mobility/safety with mobility;Strengthening/ROM OT goals addressed during session: ADL's and self-care      AM-PAC OT "6 Clicks" Daily Activity     Outcome Measure   Help from another person eating meals?: None Help from another person taking care of personal grooming?: A Little Help from another person toileting, which includes using toliet, bedpan, or urinal?: A Lot Help from another person bathing (including washing, rinsing, drying)?: A Lot Help from another person to put on and taking off regular upper body clothing?: A Little Help from another person to put on and taking off regular lower body clothing?: A Lot 6 Click Score: 16    End of Session Equipment Utilized During Treatment: Rolling walker  OT Visit Diagnosis: Unsteadiness on feet (R26.81)   Activity Tolerance Patient limited by fatigue   Patient Left in bed;with call bell/phone within reach;with bed alarm set   Nurse Communication Mobility status        Time: 1000-1031 OT  Time Calculation (min): 31 min  Charges: OT General Charges $OT Visit: 1 Visit OT Treatments $Self Care/Home Management : 8-22 mins  Shon Millet OT OT office: [place] 05/19/2019, 11:53 AM

## 2019-05-19 NOTE — Progress Notes (Signed)
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  Pt with cellulitis of R LE, CHF, and questionable infection.    PT Comments    Patient received in bed, clearly quite fatigued and appearing to be in some pain today but participatory. Able to get to EOB with MinAx2, attempted standing but unable even with 2 person assist due to poor initiation from patient. He became quite upset due to constipation pain, requested to sit at EOB for awhile- RN aware. Session quite limited due to pain, and he required increased time/effort for all tasks. Continue to recommend CIR. He was left at EOB with all needs met, bed alarm active.     Follow Up Recommendations  CIR;Supervision for mobility/OOB     Equipment Recommendations  Other (comment)(defer)    Recommendations for Other Services       Precautions / Restrictions Precautions Precautions: Fall Restrictions Weight Bearing Restrictions: No    Mobility  Bed Mobility Overal bed mobility: Needs Assistance Bed Mobility: Supine to Sit     Supine to sit: Min assist;+2 for physical assistance     General bed mobility comments: for LE management and to elevate trunk to EOB; use of bed features and rails, increased time and effort  Transfers Overall transfer level: Needs assistance Equipment used: Rolling walker (2 wheeled) Transfers: Sit to/from Stand Sit to Stand: Total assist;+2 physical assistance         General transfer comment: attempted x2 with 2-person totalA, did not initiate to assist in stand and thus unable to even clear hips from  bed  Ambulation/Gait             General Gait Details: deferred   Stairs             Wheelchair Mobility    Modified Rankin (Stroke Patients Only)       Balance Overall balance assessment: Needs assistance Sitting-balance support: Feet supported;Bilateral upper extremity supported Sitting balance-Leahy Scale: Good     Standing balance support: Bilateral upper extremity supported;During functional activity Standing balance-Leahy Scale: Poor Standing balance comment: Pt requires outside assist to remain standing.                            Cognition Arousal/Alertness: Awake/alert Behavior During Therapy: WFL for tasks assessed/performed;Flat affect Overall Cognitive Status: Within Functional Limits for tasks assessed                                 General Comments: more on the fatigued side today but participatory, very emotionally labile today and states it is due to pain from his constipation      Exercises      General Comments General comments (skin integrity, edema, etc.): on male suction catheter      Pertinent Vitals/Pain Pain Assessment: Faces Faces Pain Scale: Hurts little more Pain Location: generalized soreness/discomfort Pain Descriptors / Indicators: Aching;Sore Pain Intervention(s): Limited activity within patient's tolerance;Monitored during session    Home Living  Prior Function            PT Goals (current goals can now be found in the care plan section) Acute Rehab PT Goals Patient Stated Goal: to feel stronger and to sleep PT Goal Formulation: With patient Time For Goal Achievement: 05/29/19 Potential to Achieve Goals: Good Progress towards PT goals: Not progressing toward goals - comment(limited by pain today)    Frequency    Min 3X/week      PT Plan Current plan remains appropriate    Co-evaluation PT/OT/SLP Co-Evaluation/Treatment: Yes Reason for  Co-Treatment: To address functional/ADL transfers;For patient/therapist safety PT goals addressed during session: Mobility/safety with mobility;Strengthening/ROM        AM-PAC PT "6 Clicks" Mobility   Outcome Measure  Help needed turning from your back to your side while in a flat bed without using bedrails?: A Little Help needed moving from lying on your back to sitting on the side of a flat bed without using bedrails?: A Lot Help needed moving to and from a bed to a chair (including a wheelchair)?: A Lot Help needed standing up from a chair using your arms (e.g., wheelchair or bedside chair)?: A Lot Help needed to walk in hospital room?: A Lot Help needed climbing 3-5 steps with a railing? : A Lot 6 Click Score: 13    End of Session   Activity Tolerance: Patient limited by pain Patient left: in bed;with call bell/phone within reach;with bed alarm set(sitting at EOB) Nurse Communication: Mobility status PT Visit Diagnosis: Muscle weakness (generalized) (M62.81);Difficulty in walking, not elsewhere classified (R26.2)     Time: 2230-0979 PT Time Calculation (min) (ACUTE ONLY): 34 min  Charges:  $Therapeutic Activity: 8-22 mins(co-tx with OT)                     Windell Norfolk, DPT, PN1   Supplemental Physical Therapist [place]    Pager [phone] Acute Rehab Office [phone]

## 2019-05-19 NOTE — Progress Notes (Signed)
PROGRESS NOTE  Frank [name] NOM:[id number] DOB: Sep 04, 1945   PCP: [name], MD  Patient is from: Home.  Lives with his wife.  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 Fib, lymphedema, CKD-4, debility, morbid obesity, recurrent hospitalization for sepsis secondary to Streptococcus from RLE cellulitis presenting with progressive dyspnea and weight gain.   In ED, hemodynamically stable except for brief tachycardia to 124.  Saturation 98% on room air. CXR consistent with CHF.  K7.0. Cr 2.67 (baseline~1.8).  INR 7.2.  Hgb 7.2 (baseline about 8.0).  EKG revealed paced rhythm.  COVID-19 negative.  Received IV Lasix 120 mg once and was started on Lasix drip by cardiologist.  Also received bicarb, IV insulin and D50, and admitted for CHF exacerbation, AKI, hyperkalemia, supratherapeutic INR and anemia.  Cardiology consulted and started on Lasix drip.  Hypokalemia resolved with Lokelma and IV Lasix.  INR improved after vitamin K.  Hemoglobin dropped requiring intermittent blood transfusions.  FOBT positive.  GI consulted and not planning EGD given his cardiac comorbidity unless signs of profuse bleeding.  Anticoagulation resumed with heparin drip.  Subjective: No major events overnight or this morning.  Very sleepy.  Briefly wakes to voice and falls back to sleep.  Oriented to self and place.  No complaints.  He is on room air.  Objective: Vitals:   05/18/19 0800 05/18/19 1606 05/18/19 2207 05/19/19 0452  BP:  (!) 110/93 117/61 119/68  Pulse: 95 98 95 65  Resp: 12  14 17   Temp:  97.6 F (36.4 C) 98.7 F (37.1 C) 98.2 F (36.8 C)  TempSrc:  Oral Oral Axillary  SpO2: 100% 100% 93% 95%  Weight:    (!) 154.9 kg  Height:        Intake/Output Summary (Last 24 hours) at 05/19/2019 1123 Last data filed at 05/19/2019 0654 Gross per 24 hour  Intake 1701.78 ml  Output 3600 ml  Net -1898.22  ml   Filed Weights   05/17/19 0554 05/18/19 0618 05/19/19 0452  Weight: (!) 156.1 kg (!) 155.3 kg (!) 154.9 kg    Examination:  GENERAL: No apparent distress.  Sleepy but wakes to voice. HEENT: MMM.  Vision and hearing grossly intact.  NECK: Supple.  Difficult to assess JVD due to body habitus RESP:  No IWOB. Good air movement bilaterally. CVS:  RRR.  2/6 SEM over RUSB and LUSB. ABD/GI/GU: Bowel sounds present. Soft. Non tender but limited exam due to body habitus.  MSK/EXT: Unna boots over bilateral lower extremities.  2+ edema in RLE.  1+ edema in LLE SKIN: Unna boot over bilateral lower extremities. NEURO: Sleepy but wakes to voice.  Oriented to self and place.  No apparent focal neuro deficit. PSYCH: Sleepy and lethargic  Procedures:  None  Assessment & Plan: Acute on chronic diastolic CHF: Echo in 96/28 with EF of 55 to 60%, moderate LVH, moderate LAE and severe RAE, moderate to severe TVR.  Had significant fluid overload with massive lymphedema.  Presented with dyspnea and weight gain.  CXR concerning for CHF. Had 3.6 L UOP in the last 24 hours.  Renal function improving. -Advanced HF team managing- IV Lasix drip, Diamox and milrinone -Monitor fluid status, renal function and electrolytes -Sodium and fluid restrictions.  Acute metabolic encephalopathy likely due to hepatic encephalopathy: Ammonia elevated to 110.  No documented history of cirrhosis but at risk due to morbid obesity.  Could  also have hypercarbia given history of OSA and morbid obesity.  Has been refusing CPAP and ABG.  He was somnolent but awakes to voice when I evaluated him.  More alert when seen by cardiology later in the morning. -Continue lactulose -Minimal oxygen to maintain saturation above 90%.  AKI on CKD-4/azotemia: Baseline  Cr 1.8-2.0 > 2.67 (admit) >> 2.84>>1.91.  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  Improving with diuretics as above -Continue monitoring.  Acute on chronic blood loss anemia in in the  setting of supratherapeutic INR: Hgb ~8.0 (baseline)> 7.2 (admit)> 6.5>2u>7.2> 7.1> 6.9>1u> 6.5>1u> 7.0> 7.3.  FOBT positive.  INR 2.3.  LDH slightly elevated.  Haptoglobin low at 10.  Concern about valvular hemolysis.  -GI recs-no EGD given his cardiac comorbidity unless previously bleeding. Continue Protonix to IV every 12 hours.  Okay to resume anticoagulation with IV heparin.  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  Not a candidate for surgery. Supratherapeutic INR: INR 7.2> 7.6> vitamin K>> 4.8> 2.3 -Warfarin on hold. -Heparin drip  Paroxysmal atrial fibrillation: NSR.  Not on rate or rhythm control. -Anticoagulation as above  Hyponatremia: Likely due to renal failure.  Resolved.  Hypokalemia: Likely due to diuretics. -Scheduled K-Dur 40 mEq 3 times daily  At risk for sleep apnea/OHS: Body mass index is 47.63 kg/m.  Refuses nightly CPAP. -Discontinued   Recent hospitalization for group G Streptococcus bacteremia from RLE cellulitis in patient with lymphedema -Completed antibiotic course with penicillin G followed by oral Zyvox for a total of 14 days through 12/22. -Evaluated by ID, Dr. Tommy Medal on 1/11 and prescribed Augmentin for 2 more weeks with refill. -Blood cultures negative x2 this admission. -Received amoxicillin 2/3-2/6.  Monitor off antibiotics.   Chronic pain:  -Continue scheduled Tylenol with as needed oxycodone.   Generalized weakness/debility -PT/OT-recommended CIR.  Lymphedema/possible right LE cellulitis: Improving. -Diuretics as above. -Appreciate WOC  Morbid obesity: Body mass index is 47.63 kg/m. -Encourage lifestyle change to lose weight  Goal of care: Patient is full code.  Significant comorbidities as above poor prognosis.  Entertain palliative care consult, and was interested.  Palliative care consulted -Follow palliative care recommendations          DVT prophylaxis: On heparin drip Code Status: Full code-confirmed with patient. Family Communication: Updated patient's wife over the phone.  Discharge barrier: Multiple medical issues as above Patient is from: Home Final disposition: To be determined based on clinical progress  Consultants: Cardiology   Microbiology summarized: [id number] and influenza PCR negative. Blood cultures negative so far  Sch Meds:  Scheduled Meds: . sodium chloride   Intravenous Once  . sodium chloride   Intravenous Once  . acetaminophen  1,000 mg Oral Q8H  . acetaZOLAMIDE  250 mg Oral BID  . amoxicillin  500 mg Oral Q8H  . ferrous sulfate  325 mg Oral Q breakfast  . lactulose  20 g Oral TID  . mouth rinse  15 mL Mouth Rinse BID  . pantoprazole  40 mg Oral BID  . potassium chloride  40 mEq Oral BID  . senna  2 tablet Oral QHS   Continuous Infusions: . furosemide (LASIX) infusion 30 mg/hr (05/18/19 2321)  . heparin 1,250 Units/hr (05/19/19 0501)  . milrinone 0.125 mcg/kg/min (05/18/19 2244)   PRN Meds:.guaiFENesin-dextromethorphan, lip balm, oxyCODONE, polyethylene glycol, sodium chloride flush  Antimicrobials: Anti-infectives (From admission, onward)   Start     Dose/Rate Route Frequency Ordered  Stop   05/14/19 1700  amoxicillin (AMOXIL) capsule 500 mg     500 mg Oral Every 8 hours 05/14/19 1620         I have personally reviewed the following labs and images: CBC: Recent Labs  Lab 05/14/19 0435 05/14/19 0435 05/14/19 0819 05/14/19 0819 05/15/19 0559 05/15/19 1109 05/17/19 0614 05/17/19 1644 05/18/19 0455 05/18/19 1641 05/19/19 0500  WBC 8.2   < > 7.7  --  8.1  --  7.0  --  8.6  --  7.9  NEUTROABS 5.5  --   --   --   --   --   --   --   --   --   --   HGB 6.5*   < > 6.4*   < > 7.2*   < > 6.5* 7.0* 7.0* 7.5* 7.3*  HCT 21.4*   < > 21.9*   < > 23.6*   < > 21.9* 23.0* 22.7* 25.0* 23.5*  MCV 94.7   < > 95.6  --  94.4  --  97.3  --  95.0  --  96.3  PLT 266   < > 257  --  256   --  233  --  257  --  260   < > = values in this interval not displayed.   BMP &GFR Recent Labs  Lab 05/15/19 0559 05/15/19 0559 05/15/19 1518 05/16/19 0309 05/17/19 0614 05/18/19 0455 05/19/19 0500  NA 131*   < > 130* 132* 133* 137 139  K 5.5*   < > 6.0* 4.9 3.2* 3.2* 2.9*  CL 91*   < > 89* 87* 91* 94* 91*  CO2 29   < > 28 29 30 29  32  GLUCOSE 125*   < > 121* 113* 109* 108* 109*  BUN 98*   < > 98* 100* 91* 81* 69*  CREATININE 2.91*   < > 2.89* 2.84* 2.37* 2.06* 1.91*  CALCIUM 8.7*   < > 8.6* 8.8* 8.3* 8.3* 8.7*  MG 3.1*  --   --  3.0* 2.9* 2.6* 2.4  PHOS 5.7*   < > 5.3* 5.3* 5.1* 4.0 4.1   < > = values in this interval not displayed.   Estimated Creatinine Clearance: 53.7 mL/min (A) (by C-G formula based on SCr of 1.91 mg/dL (H)). Liver & Pancreas: Recent Labs  Lab 05/14/19 0435 05/14/19 1210 05/15/19 1518 05/16/19 0309 05/17/19 3295 05/18/19 0455 05/19/19 0500  AST 40  --   --   --   --   --   --   ALT 15  --   --   --   --   --   --   ALKPHOS 140*  --   --   --   --   --   --   BILITOT 1.4*  --   --   --   --   --   --   PROT 7.2  --   --   --   --   --   --   ALBUMIN 2.5*   < > 2.5* 2.4* 2.3* 2.6* 2.6*   < > = values in this interval not displayed.   No results for input(s): LIPASE, AMYLASE in the last 168 hours. Recent Labs  Lab 05/18/19 1641  AMMONIA 110*   Diabetic: No results for input(s): HGBA1C in the last 72 hours. Recent Labs  Lab 05/14/19 1128  GLUCAP 96   Cardiac Enzymes: No results for  input(s): CKTOTAL, CKMB, CKMBINDEX, TROPONINI in the last 168 hours. No results for input(s): PROBNP in the last 8760 hours. Coagulation Profile: Recent Labs  Lab 05/15/19 1518 05/16/19 0309 05/17/19 0614 05/18/19 0455 05/19/19 0500  INR 6.6* 4.8* 2.9* 2.3* 2.3*   Thyroid Function Tests: No results for input(s): TSH, T4TOTAL, FREET4, T3FREE, THYROIDAB in the last 72 hours. Lipid Profile: No results for input(s): CHOL, HDL, LDLCALC, TRIG, CHOLHDL,  LDLDIRECT in the last 72 hours. Anemia Panel: No results for input(s): VITAMINB12, FOLATE, FERRITIN, TIBC, IRON, RETICCTPCT in the last 72 hours. Urine analysis:    Component Value Date/Time   COLORURINE YELLOW 03/17/2019 0054   APPEARANCEUR CLOUDY (A) 03/17/2019 0054   LABSPEC 1.006 03/17/2019 0054   PHURINE 6.0 03/17/2019 0054   GLUCOSEU NEGATIVE 03/17/2019 0054   HGBUR SMALL (A) 03/17/2019 0054   BILIRUBINUR NEGATIVE 03/17/2019 0054   KETONESUR NEGATIVE 03/17/2019 0054   PROTEINUR 30 (A) 03/17/2019 0054   NITRITE NEGATIVE 03/17/2019 0054   LEUKOCYTESUR LARGE (A) 03/17/2019 0054   Sepsis Labs: Invalid input(s): PROCALCITONIN, [place]  Microbiology: Recent Results (from the past 240 hour(s))  Respiratory Panel by RT PCR (Flu A&B, Covid) - Nasopharyngeal Swab     Status: None   Collection Time: 05/13/19  8:19 PM   Specimen: Nasopharyngeal Swab  Result Value Ref Range Status   SARS Coronavirus 2 by RT PCR NEGATIVE NEGATIVE Final    Comment: (NOTE) SARS-CoV-2 target nucleic acids are NOT DETECTED. The SARS-CoV-2 RNA is generally detectable in upper respiratoy specimens during the acute phase of infection. The lowest concentration of SARS-CoV-2 viral copies this assay can detect is 131 copies/mL. A negative result does not preclude SARS-Cov-2 infection and should not be used as the sole basis for treatment or other patient management decisions. A negative result may occur with  improper specimen collection/handling, submission of specimen other than nasopharyngeal swab, presence of viral mutation(s) within the areas targeted by this assay, and inadequate number of viral copies (<131 copies/mL). A negative result must be combined with clinical observations, patient history, and epidemiological information. The expected result is Negative. Fact Sheet for Patients:  PinkCheek.be Fact Sheet for Healthcare Providers:    GravelBags.it This test is not yet ap proved or cleared by the [place] FDA and  has been authorized for detection and/or diagnosis of SARS-CoV-2 by FDA under an Emergency Use Authorization (EUA). This EUA will remain  in effect (meaning this test can be used) for the duration of the COVID-19 declaration under Section 564(b)(1) of the Act, 21 U.S.C. section 360bbb-3(b)(1), unless the authorization is terminated or revoked sooner.    Influenza A by PCR NEGATIVE NEGATIVE Final   Influenza B by PCR NEGATIVE NEGATIVE Final    Comment: (NOTE) The Xpert Xpress SARS-CoV-2/FLU/RSV assay is intended as an aid in  the diagnosis of influenza from Nasopharyngeal swab specimens and  should not be used as a sole basis for treatment. Nasal washings and  aspirates are unacceptable for Xpert Xpress SARS-CoV-2/FLU/RSV  testing. Fact Sheet for Patients: PinkCheek.be Fact Sheet for Healthcare Providers: GravelBags.it This test is not yet approved or cleared by the [place] FDA and  has been authorized for detection and/or diagnosis of SARS-CoV-2 by  FDA under an Emergency Use Authorization (EUA). This EUA will remain  in effect (meaning this test can be used) for the duration of the  Covid-19 declaration under Section 564(b)(1) of the Act, 21  U.S.C. section 360bbb-3(b)(1), unless the authorization is  terminated or revoked.  Performed at [facility], [place] [address] [place], [place] [address]   Culture, blood (routine x 2)     Status: None (Preliminary result)   Collection Time: 05/14/19  2:28 PM   Specimen: BLOOD RIGHT HAND  Result Value Ref Range Status   Specimen Description BLOOD RIGHT HAND  Final   Special Requests   Final    BOTTLES DRAWN AEROBIC ONLY Blood Culture adequate volume   Culture   Final    NO GROWTH 4 DAYS Performed at [facility], [place] [address] [place], [place]  [address]    Report Status PENDING  Incomplete  Culture, blood (routine x 2)     Status: None (Preliminary result)   Collection Time: 05/14/19  2:34 PM   Specimen: BLOOD LEFT HAND  Result Value Ref Range Status   Specimen Description BLOOD LEFT HAND  Final   Special Requests   Final    BOTTLES DRAWN AEROBIC ONLY Blood Culture adequate volume   Culture   Final    NO GROWTH 4 DAYS Performed at [facility], [place] [address], [place],  [address]    Report Status PENDING  Incomplete    Radiology Studies: No results found.  Donatello Kleve T. Napoleon  If 7PM-7AM, please contact night-coverage [email or web address] Password [facility] 05/19/2019, 11:23 AM

## 2019-05-19 NOTE — Progress Notes (Signed)
Advanced Heart Failure Rounding Note   Subjective:    Was lethargic yesterday. Ammonia checked 110. Lactulose started. More alert today. Denies CP or SOB.   BUN/CR continue to improv with milrinone. Co-ox 65% (via midline PICC). Lasix gtt up to 30. Down another pound.   Hgb drifting back down. Now on heparin   Objective:   Weight Range:  Vital Signs:   Temp:  [97.6 F (36.4 C)-98.7 F (37.1 C)] 98.2 F (36.8 C) (02/08 0452) Pulse Rate:  [65-98] 65 (02/08 0452) Resp:  [14-17] 17 (02/08 0452) BP: (110-119)/(61-93) 119/68 (02/08 0452) SpO2:  [93 %-100 %] 95 % (02/08 0452) Weight:  [154.9 kg] 154.9 kg (02/08 0452) Last BM Date: 05/15/19  Weight change: Filed Weights   05/17/19 0554 05/18/19 0618 05/19/19 0452  Weight: (!) 156.1 kg (!) 155.3 kg (!) 154.9 kg    Intake/Output:   Intake/Output Summary (Last 24 hours) at 05/19/2019 0956 Last data filed at 05/19/2019 0654 Gross per 24 hour  Intake 1701.78 ml  Output 3600 ml  Net -1898.22 ml     Physical Exam: General:  Morbidly obese male sitting up in bed. Chronically ill appearing. More lethargic No resp difficulty HEENT: normal Neck: supple. JVP to ear . Carotids 2+ bilat; no bruits. No lymphadenopathy or thryomegaly appreciated. Cor: PMI nondisplaced. Regular rate & rhythm. 2/6 TR, 2/6 AS + mech s1 Lungs: dull at bases Abdomen: soft, nontender, nondistended. No hepatosplenomegaly. No bruits or masses. Good bowel sounds. Extremities: no cyanosis, clubbing, rash, 2+ edema + UNNA boots Neuro: alert & orientedx3, cranial nerves grossly intact. moves all 4 extremities w/o difficulty. Affect pleasant    Telemetry: AV paced 60-80s Personally reviewed   Labs: Basic Metabolic Panel: Recent Labs  Lab 05/15/19 0559 05/15/19 0559 05/15/19 1518 05/15/19 1518 05/16/19 0309 05/16/19 0309 05/17/19 7001 05/18/19 0455 05/19/19 0500  NA 131*   < > 130*  --  132*  --  133* 137 139  K 5.5*   < > 6.0*  --  4.9  --  3.2*  3.2* 2.9*  CL 91*   < > 89*  --  87*  --  91* 94* 91*  CO2 29   < > 28  --  29  --  30 29 32  GLUCOSE 125*   < > 121*  --  113*  --  109* 108* 109*  BUN 98*   < > 98*  --  100*  --  91* 81* 69*  CREATININE 2.91*   < > 2.89*  --  2.84*  --  2.37* 2.06* 1.91*  CALCIUM 8.7*   < > 8.6*   < > 8.8*   < > 8.3* 8.3* 8.7*  MG 3.1*  --   --   --  3.0*  --  2.9* 2.6* 2.4  PHOS 5.7*   < > 5.3*  --  5.3*  --  5.1* 4.0 4.1   < > = values in this interval not displayed.    Liver Function Tests: Recent Labs  Lab 05/14/19 0435 05/14/19 1210 05/15/19 1518 05/16/19 0309 05/17/19 7494 05/18/19 0455 05/19/19 0500  AST 40  --   --   --   --   --   --   ALT 15  --   --   --   --   --   --   ALKPHOS 140*  --   --   --   --   --   --  BILITOT 1.4*  --   --   --   --   --   --   PROT 7.2  --   --   --   --   --   --   ALBUMIN 2.5*   < > 2.5* 2.4* 2.3* 2.6* 2.6*   < > = values in this interval not displayed.   No results for input(s): LIPASE, AMYLASE in the last 168 hours. Recent Labs  Lab 05/18/19 1641  AMMONIA 110*    CBC: Recent Labs  Lab 05/14/19 0435 05/14/19 0435 05/14/19 0819 05/14/19 0819 05/15/19 0559 05/15/19 1109 05/17/19 0614 05/17/19 1644 05/18/19 0455 05/18/19 1641 05/19/19 0500  WBC 8.2   < > 7.7  --  8.1  --  7.0  --  8.6  --  7.9  NEUTROABS 5.5  --   --   --   --   --   --   --   --   --   --   HGB 6.5*   < > 6.4*   < > 7.2*   < > 6.5* 7.0* 7.0* 7.5* 7.3*  HCT 21.4*   < > 21.9*   < > 23.6*   < > 21.9* 23.0* 22.7* 25.0* 23.5*  MCV 94.7   < > 95.6  --  94.4  --  97.3  --  95.0  --  96.3  PLT 266   < > 257  --  256  --  233  --  257  --  260   < > = values in this interval not displayed.    Cardiac Enzymes: No results for input(s): CKTOTAL, CKMB, CKMBINDEX, TROPONINI in the last 168 hours.  BNP: BNP (last 3 results) Recent Labs    03/16/19 2359 05/13/19 1930  BNP 105.7* 163.6*    ProBNP (last 3 results) No results for input(s): PROBNP in the last 8760  hours.    Other results:  Imaging: No results found.   Medications:     Scheduled Medications: . sodium chloride   Intravenous Once  . sodium chloride   Intravenous Once  . acetaminophen  1,000 mg Oral Q8H  . acetaZOLAMIDE  250 mg Oral BID  . amoxicillin  500 mg Oral Q8H  . ferrous sulfate  325 mg Oral Q breakfast  . lactulose  20 g Oral TID  . mouth rinse  15 mL Mouth Rinse BID  . pantoprazole  40 mg Oral BID  . potassium chloride  40 mEq Oral BID  . senna  2 tablet Oral QHS    Infusions: . furosemide (LASIX) infusion 30 mg/hr (05/18/19 2321)  . heparin 1,250 Units/hr (05/19/19 0501)  . milrinone 0.125 mcg/kg/min (05/18/19 2244)    PRN Medications: guaiFENesin-dextromethorphan, lip balm, oxyCODONE, polyethylene glycol, sodium chloride flush   Assessment/Plan:   1. 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  - He is massively volume overloaded with severe R-sided HF in the setting of wide open TR (which I suspect is due to torn TV) - Was not responding well to lasix gtt at 20 with diamox. Milrinone added 2/5 - Co-ox 65% on milrinone 0.125.  - Renal function improving with milrinone now 1.91  Diuresis picking up but still overloaded - Continue lasix gtt at 30. Continue diamox Will give another dose of metoalzone  2. 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  - not candidate for surgery - no change  3. AKI on CKD  -Baseline creatinine < 2.0  - Was up to 2.8. Milrinone aded 2/5. Now 2.3 -> 2.06 -> 1.9 - suspect cardiorenal.  - continue milrinone  4. Anemia  - GI consulted.  - Had colonoscopy in 02/2019 with ext/int hemmorhoids.  - LDH mildly elevated at 300 -> 238. Haptoglobin <10. Suspect component of valvular hemolysis but do not feel that this is high-grade process to explain all of his anemia.  - GI and primary team managing.  - INR 2.3. Cotninue heparin givem mech MVR. Discussed dosing with PharmD  personally.  5. Mechanical MVR with moderate MS - INR down to 2.3   - Coumadin has been on hold. Now on heparin  - See discussion abve  6. Moderate AS - medical therapy   7. Obesity  Body mass index is 47.84 kg/m.  8. Hypokalemia - supp  9. Lethargy - Ammonia 100. 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 [place] - Appreciate Palliative input. I worry his short-term prognosis is quite poor.    Length of Stay: 6   Glori Bickers MD 05/19/2019, 9:56 AM  Advanced Heart Failure Team Pager [phone] (M-F; [place])  Please contact [facility] for night-coverage after hours (4p -7a ) and weekends on [email or web address]

## 2019-05-19 NOTE — Progress Notes (Addendum)
Daily Progress Note   Patient Name: Frank [name]       Date: 05/19/2019 DOB: [birth year]  Age: 74 y.o. MRN#: [id number] Attending Physician: Mercy Riding, MD Primary Care Physician: [name], MD Admit Date: 05/13/2019  Reason for Consultation/Follow-up: Establishing goals of care, Non pain symptom management, Pain control and Psychosocial/spiritual support  Subjective: As I entered the room the patient and his wife are both at bedside crying.  The patient is having severe pain due to constipation.  Christy Cabin crew) and the [employer] helped get the patient in bed and I attempted a gentle disimpaction 2x.  Dark black wet stool was present on my glove.  However as we removed minimal stool Christy administered a fleet enema.  The patient and his wife were in too much distress to discuss goals of care.   I will return tomorrow to have a straight forward discussion with the couple and discuss code status.   Assessment: Advanced right heart failure.  Creatinine slightly improved.  Concern for cardiac cirrhosis with an ammonia level of 110.   Patient Profile/HPI:  74 y.o. male  with past medical history of chronic heart failure with preserved ejection fraction, HOCM s/p myomectomy, valvular disease with MVR, atrial fibrillation, pacemaker placement, CKD 4, and morbid obesity who was admitted on 05/13/2019 with massive fluid overload and LE edema attributed to right sided heart failure.  He was recently hospitaliized for sepsis due to group G strep bacteremia from right lower extremity nonpurulent cellulitis.     Length of Stay: 6  Current Medications: Scheduled Meds:  . sodium chloride   Intravenous Once  . sodium chloride   Intravenous Once  . acetaminophen  1,000 mg Oral Q8H  .  acetaZOLAMIDE  250 mg Oral BID  . amoxicillin  500 mg Oral Q8H  . ferrous sulfate  325 mg Oral Q breakfast  . lactulose  30 g Oral TID  . mouth rinse  15 mL Mouth Rinse BID  . pantoprazole  40 mg Oral BID  . potassium chloride  40 mEq Oral TID  . senna  2 tablet Oral QHS    Continuous Infusions: . furosemide (LASIX) infusion 30 mg/hr (05/18/19 2321)  . heparin 1,300 Units/hr (05/19/19 1619)  . milrinone 0.125 mcg/kg/min (05/18/19 2244)    PRN Meds:  guaiFENesin-dextromethorphan, lip balm, oxyCODONE, polyethylene glycol, sodium chloride flush  Physical Exam        Very pleasant chronically ill obese male, crying in distress, complaining of pain in his rectum.  Vital Signs: BP 119/68 (BP Location: Left Arm)   Pulse 65   Temp 98.2 F (36.8 C) (Axillary)   Resp 17   Ht 5\' 11"  (1.803 m)   Wt (!) 154.9 kg   SpO2 95%   BMI 47.63 kg/m  SpO2: SpO2: 95 % O2 Device: O2 Device: Nasal Cannula O2 Flow Rate: O2 Flow Rate (L/min): 2 L/min  Intake/output summary:   Intake/Output Summary (Last 24 hours) at 05/19/2019 1638 Last data filed at 05/19/2019 0654 Gross per 24 hour  Intake 791.55 ml  Output 2400 ml  Net -1608.45 ml   LBM: Last BM Date: 05/15/19 Baseline Weight: Weight: (!) 157.4 kg Most recent weight: Weight: (!) 154.9 kg       Palliative Assessment/Data: 40%      Patient Active Problem List   Diagnosis Date Noted  . Palliative care by specialist   . Anemia   . Heme positive stool   . Anticoagulated   . Acute renal failure superimposed on stage 3b chronic kidney disease ([place]) 05/14/2019  . Hyperkalemia 05/14/2019  . Acute on chronic right heart failure ([place])   . Status post biventricular pacemaker   . Acute CHF (congestive heart failure) ([place]) 05/13/2019  . [place] 03/23/2019  . Hypotension 03/20/2019  . Group G streptococcal infection 03/19/2019  . Bacteremia 03/19/2019  . Sepsis ([place]) 03/17/2019  . UTI (urinary tract infection) 03/17/2019  . Acute  respiratory failure with hypoxia ([place]) 03/17/2019  . Acute exacerbation of CHF (congestive heart failure) ([place]) 03/17/2019  . AKI (acute kidney injury) ([place]) 03/17/2019  . Change in bowel habits   . Rectal bleeding   . Pulmonary hypertension ([place]) 12/17/2017  . Complete heart block ([place]) 07/28/2017  . OSA (obstructive sleep apnea) 06/08/2017  . NICM (nonischemic cardiomyopathy) ([place]) 05/28/2017  . Chronic combined systolic and diastolic heart failure ([place]) 01/18/2017  . Dyspnea on exertion 07/12/2015  . Encounter for therapeutic drug monitoring 05/19/2013  . Cardiac pacemaker-St.Jude 06/22/2011  . S/P mitral valve replacement-Mechanical 10/10/2010  . Atrial fibrillation/tachycardia 06/08/2010  . TIA (transient ischemic attack) 06/08/2010  . S/P aortic valve repair 06/08/2010  . Acute on chronic diastolic heart failure ([place]) 08/10/2009  . VENTRICULAR TACHYCARDIA 11/23/2008  . MORBID OBESITY 06/10/2007  . Essential hypertension 06/10/2007  . 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 DYSPNEA 06/10/2007  . CT, CHEST, ABNORMAL 06/10/2007  . MELANOMA, HX OF 06/10/2007    Palliative Care Plan    Recommendations/Plan:  PMT will return tomorrow to have further [place] discussions with family.    Goals of Care and Additional Recommendations:  Limitations on Scope of Treatment: Full Scope Treatment  Code Status:  Full code  Prognosis:   Unable to determine.  He is at high risk for acute decompensation and unfortunately death given his respiratory, cardiac, renal and hepatic status.   Discharge Planning:  To Be Determined  Care plan was discussed with Wife.  Thank you for allowing the Palliative Medicine Team to assist in the care of this patient.  Total time spent:  35 min.     Greater than 50%  of this time was spent counseling and coordinating care related to the above assessment and plan.  Florentina Jenny, PA-C Palliative Medicine  Please contact  Palliative MedicineTeam phone at [phone] for questions  and concerns between 7 am - 7 pm.   Please see AMION for individual provider pager numbers.

## 2019-05-20 DIAGNOSIS — T59891A Toxic effect of other specified gases, fumes and vapors, accidental (unintentional), initial encounter: Secondary | ICD-10-CM

## 2019-05-20 DIAGNOSIS — Z66 Do not resuscitate: Secondary | ICD-10-CM

## 2019-05-20 DIAGNOSIS — K7200 Acute and subacute hepatic failure without coma: Secondary | ICD-10-CM

## 2019-05-20 DIAGNOSIS — G9341 Metabolic encephalopathy: Secondary | ICD-10-CM

## 2019-05-20 DIAGNOSIS — G92 Toxic encephalopathy: Secondary | ICD-10-CM

## 2019-05-20 DIAGNOSIS — G928 Other toxic encephalopathy: Secondary | ICD-10-CM

## 2019-05-20 LAB — AMMONIA: Ammonia: 96 umol/L — ABNORMAL HIGH (ref 9–35)

## 2019-05-20 LAB — RENAL FUNCTION PANEL
Albumin: 2.5 g/dL — ABNORMAL LOW (ref 3.5–5.0)
Anion gap: 13 (ref 5–15)
BUN: 62 mg/dL — ABNORMAL HIGH (ref 8–23)
CO2: 31 mmol/L (ref 22–32)
Calcium: 8.6 mg/dL — ABNORMAL LOW (ref 8.9–10.3)
Chloride: 95 mmol/L — ABNORMAL LOW (ref 98–111)
Creatinine, Ser: 1.78 mg/dL — ABNORMAL HIGH (ref 0.61–1.24)
GFR calc Af Amer: 44 mL/min — ABNORMAL LOW (ref >60)
GFR calc non Af Amer: 38 mL/min — ABNORMAL LOW (ref >60)
Glucose, Bld: 108 mg/dL — ABNORMAL HIGH (ref 70–99)
Phosphorus: 4.1 mg/dL (ref 2.5–4.6)
Potassium: 2.3 mmol/L — CL (ref 3.5–5.1)
Sodium: 139 mmol/L (ref 135–145)

## 2019-05-20 LAB — BASIC METABOLIC PANEL
Anion gap: 13 (ref 5–15)
BUN: 58 mg/dL — ABNORMAL HIGH (ref 8–23)
CO2: 30 mmol/L (ref 22–32)
Calcium: 8.3 mg/dL — ABNORMAL LOW (ref 8.9–10.3)
Chloride: 95 mmol/L — ABNORMAL LOW (ref 98–111)
Creatinine, Ser: 1.86 mg/dL — ABNORMAL HIGH (ref 0.61–1.24)
GFR calc Af Amer: 41 mL/min — ABNORMAL LOW (ref >60)
GFR calc non Af Amer: 36 mL/min — ABNORMAL LOW (ref >60)
Glucose, Bld: 120 mg/dL — ABNORMAL HIGH (ref 70–99)
Potassium: 2.6 mmol/L — CL (ref 3.5–5.1)
Sodium: 138 mmol/L (ref 135–145)

## 2019-05-20 LAB — COOXEMETRY PANEL
Carboxyhemoglobin: 1.9 % — ABNORMAL HIGH (ref 0.5–1.5)
Methemoglobin: 0.6 % (ref 0.0–1.5)
O2 Saturation: 86.4 %
Total hemoglobin: 14.1 g/dL (ref 12.0–16.0)

## 2019-05-20 LAB — BLOOD GAS, ARTERIAL
Acid-Base Excess: 9.8 mmol/L — ABNORMAL HIGH (ref 0.0–2.0)
Bicarbonate: 34.1 mmol/L — ABNORMAL HIGH (ref 20.0–28.0)
FIO2: 21
O2 Saturation: 92.8 %
Patient temperature: 37
pCO2 arterial: 48.2 mmHg — ABNORMAL HIGH (ref 32.0–48.0)
pH, Arterial: 7.463 — ABNORMAL HIGH (ref 7.350–7.450)
pO2, Arterial: 62.8 mmHg — ABNORMAL LOW (ref 83.0–108.0)

## 2019-05-20 LAB — CBC
HCT: 23.4 % — ABNORMAL LOW (ref 39.0–52.0)
Hemoglobin: 7.2 g/dL — ABNORMAL LOW (ref 13.0–17.0)
MCH: 29.9 pg (ref 26.0–34.0)
MCHC: 30.8 g/dL (ref 30.0–36.0)
MCV: 97.1 fL (ref 80.0–100.0)
Platelets: 257 10*3/uL (ref 150–400)
RBC: 2.41 MIL/uL — ABNORMAL LOW (ref 4.22–5.81)
RDW: 19.2 % — ABNORMAL HIGH (ref 11.5–15.5)
WBC: 7 10*3/uL (ref 4.0–10.5)
nRBC: 0.7 % — ABNORMAL HIGH (ref 0.0–0.2)

## 2019-05-20 LAB — HEPARIN LEVEL (UNFRACTIONATED): Heparin Unfractionated: 0.63 IU/mL (ref 0.30–0.70)

## 2019-05-20 LAB — MAGNESIUM: Magnesium: 2.2 mg/dL (ref 1.7–2.4)

## 2019-05-20 LAB — PROTIME-INR
INR: 2.2 — ABNORMAL HIGH (ref 0.8–1.2)
Prothrombin Time: 24 seconds — ABNORMAL HIGH (ref 11.4–15.2)

## 2019-05-20 MED ORDER — SPIRONOLACTONE 25 MG PO TABS
50.0000 mg | ORAL_TABLET | Freq: Every day | ORAL | Status: DC
  Administered 2019-05-20 – 2019-05-21 (×2): 50 mg via ORAL
  Filled 2019-05-20 (×3): qty 2

## 2019-05-20 MED ORDER — POTASSIUM CHLORIDE 10 MEQ/100ML IV SOLN
10.0000 meq | INTRAVENOUS | Status: AC
  Administered 2019-05-20 (×3): 10 meq via INTRAVENOUS
  Filled 2019-05-20 (×3): qty 100

## 2019-05-20 MED ORDER — SPIRONOLACTONE 25 MG PO TABS: 25.0000 mg | ORAL_TABLET | Freq: Every day | ORAL | Status: DC

## 2019-05-20 MED ORDER — POTASSIUM CHLORIDE 20 MEQ PO PACK
40.0000 meq | PACK | ORAL | Status: DC
  Filled 2019-05-20 (×3): qty 2

## 2019-05-20 MED ORDER — POTASSIUM CHLORIDE CRYS ER 20 MEQ PO TBCR
40.0000 meq | EXTENDED_RELEASE_TABLET | ORAL | Status: AC
  Administered 2019-05-20 – 2019-05-21 (×4): 40 meq via ORAL
  Filled 2019-05-20 (×4): qty 2

## 2019-05-20 MED ORDER — LACTULOSE 10 GM/15ML PO SOLN
20.0000 g | Freq: Three times a day (TID) | ORAL | Status: DC
  Administered 2019-05-20 (×2): 20 g via ORAL
  Filled 2019-05-20 (×2): qty 30

## 2019-05-20 MED ORDER — POTASSIUM CHLORIDE 10 MEQ/100ML IV SOLN: 10.0000 meq | INTRAVENOUS | Status: DC

## 2019-05-20 NOTE — Progress Notes (Signed)
PROGRESS NOTE  Frank [name] WFU:[id number] DOB: [birth year]   PCP: [name], MD  Patient is from: Home.  Lives with his wife.  DOA: 05/13/2019 LOS: 7  Brief Narrative / Interim history: 74 year old male with HCM s/p myomectomy, mechanical MVR/AVR on warfarin, PPM, A. Fib, lymphedema, CKD-4, debility, morbid obesity, recurrent hospitalization for sepsis secondary to Streptococcus from RLE cellulitis presenting with progressive dyspnea and weight gain.   In ED, hemodynamically stable except for brief tachycardia to 124.  Saturation 98% on room air. CXR consistent with CHF.  K7.0. Cr 2.67 (baseline~1.8).  INR 7.2.  Hgb 7.2 (baseline about 8.0).  EKG revealed paced rhythm.  COVID-19 negative.  Received IV Lasix 120 mg once and was started on Lasix drip by cardiologist.  Also received bicarb, IV insulin and D50, and admitted for CHF exacerbation, AKI, hyperkalemia, supratherapeutic INR and anemia.  Cardiology consulted and started on Lasix drip.  Hypokalemia resolved with Lokelma and IV Lasix.  INR improved after vitamin K.  Hemoglobin dropped requiring intermittent blood transfusions.  FOBT positive.  GI consulted and not planning EGD given his cardiac comorbidity unless signs of profuse bleeding.  Anticoagulation resumed with heparin drip.  Subjective: No major events overnight or this morning.  Per RN, patient was awake and alert earlier this morning.  He is now drowsy.  Wakes to voice and falls back to sleep.  Feels tired.  He says he was up last night due to multiple bowel movements. He denies chest pain.  Breathing "okay".   Objective: Vitals:   05/20/19 0300 05/20/19 0408 05/20/19 0910 05/20/19 1109  BP:  (!) 128/55 (!) 106/47 (!) 112/56  Pulse:  75 (!) 122 96  Resp:  14 (!) 24 13  Temp:  (!) 97.5 F (36.4 C) 97.9 F (36.6 C)   TempSrc:  Oral Oral   SpO2:  98% 100% (!) 84%  Weight: (!) 152.3 kg     Height:        Intake/Output Summary (Last 24 hours) at 05/20/2019  1400 Last data filed at 05/20/2019 1100 Gross per 24 hour  Intake 1047.83 ml  Output 2300 ml  Net -1252.17 ml   Filed Weights   05/18/19 0618 05/19/19 0452 05/20/19 0300  Weight: (!) 155.3 kg (!) 154.9 kg (!) 152.3 kg    Examination:  GENERAL: Sleepy and fatigued HEENT: MMM.  Vision and hearing grossly intact.  NECK: Supple.  Difficult to assess JVD due to body habitus. RESP: On 2 L by [place].  Fair aeration bilaterally but limited exam due to body habitus. CVS:  RRR.  2/6 SEM over RUSB and LUSB. ABD/GI/GU: Bowel sounds present. Soft. Non tender.  MSK/EXT: Ace wrap over bilateral LE's.  2+ edema in LE.  1+ edema in LLE. SKIN: Ace wrap over bilateral LEs. NEURO: Drowsy and fatigued.  Wakes to voice but falls back to sleep.  No apparent focal neuro deficit. PSYCH: Appears fatigued and lethargic.  Procedures:  None  Assessment & Plan: Acute on chronic diastolic CHF: Echo in 20/25 with EF of 55 to 60%, moderate LVH, moderate LAE and severe RAE, moderate to severe TVR.  Had significant fluid overload with massive lymphedema.  Presented with dyspnea and weight gain.  CXR concerning for CHF.  I and O incomplete but 1.7 L UOP charted overnight.  Renal function improving. -Advanced HF team managing- IV Lasix drip, diamox and milrinone.  Added Aldactone mainly for hypokalemia. -Monitor fluid status, renal function and electrolytes -Sodium and fluid restrictions.  Acute metabolic encephalopathy likely due to hepatic encephalopathy: Ammonia 110> 98.  No documented history of cirrhosis but at risk due to morbid obesity.  ABG reassuring.  He is sleepy and fatigued this morning.  Reportedly awake and alert earlier. -Decrease lactulose to 20 g 3 times daily-had multiple BMs last night. -Minimal oxygen to maintain saturation above 90%.  AKI on CKD-4/azotemia: Baseline  Cr 1.8-2.0 > 2.67 (admit) >> 2.84>>1.91> 1.78.  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  Improving with diuretics as above -Continue  monitoring.  Acute on chronic blood loss anemia in in the setting of supratherapeutic INR: Hgb ~8.0 (baseline)> 7.2 (admit)>> 6.5>>4u>7.0>> 7.2.   INR 7.6>>2.2. FOBT positive.  Elevated LDH and low haptoglobin concerning for some element of valvular hemolysis.  H&H low but seems to be stable now. 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  Not a candidate for surgery. Supratherapeutic INR: INR 7.2> 7.6> vitamin K>> 4.8> 2.2 -Warfarin on hold. -Heparin drip  Paroxysmal atrial fibrillation: NSR.  Not on rate or rhythm control. -Anticoagulation as above  Hyponatremia: Likely due to renal failure.  Resolved.  Hypokalemia: K2.3.  Likely due to diuretics and lactulose.  Magnesium within normal. -Received IV KCl 30 mEq. -Continue K-Dur 40 mEq 3 times daily -Add Aldactone 50 mg daily. -Reduce lactulose as above.  At risk for sleep apnea/OHS: Body mass index is 46.83 kg/m.  Refuses nightly CPAP. -Discontinued   Recent hospitalization for group G Streptococcus bacteremia from RLE cellulitis in patient with lymphedema -Completed antibiotic course with penicillin G followed by oral Zyvox for a total of 14 days through 12/22. -Evaluated by ID, Dr. Tommy Medal on 1/11 and prescribed Augmentin for 2 more weeks with refill. -Blood cultures negative x2 this admission. -Received amoxicillin 2/3-2/9.  Monitor off antibiotics.   Chronic pain:  -Continue scheduled Tylenol with as needed oxycodone.   Generalized weakness/debility -PT/OT-recommended CIR.  Lymphedema/possible right LE cellulitis: Improving. -Diuretics as above. -Appreciate WOC  Morbid obesity: Body mass index is 46.83 kg/m. -Encourage lifestyle change to lose weight  Goal of  care: Patient is full code.  Significant comorbidities as above poor prognosis.  Entertain palliative care consult, and was interested.  Palliative care consulted -Follow palliative care recommendations        DVT prophylaxis: On heparin drip Code Status: Full code-confirmed with patient. Family Communication: Updated patient's wife at bedside.  Discharge barrier: Multiple medical issues as above Patient is from: Home Final disposition: To be determined based on clinical progress  Consultants: Cardiology   Microbiology summarized: [id number] and influenza PCR negative. Blood cultures negative so far  Sch Meds:  Scheduled Meds: . sodium chloride   Intravenous Once  . sodium chloride   Intravenous Once  . acetaminophen  1,000 mg Oral Q8H  . acetaZOLAMIDE  250 mg Oral BID  . ferrous sulfate  325 mg Oral Q breakfast  . lactulose  20 g Oral TID  . mouth rinse  15 mL Mouth Rinse BID  . pantoprazole  40 mg Oral BID  . potassium chloride  40 mEq Oral TID  . spironolactone  50 mg Oral Daily   Continuous Infusions: . furosemide (LASIX) infusion 30 mg/hr (05/20/19 1325)  . heparin 1,300 Units/hr (05/19/19 1619)  . milrinone 0.125 mcg/kg/min (05/20/19 0730)  PRN Meds:.lip balm, oxyCODONE, polyethylene glycol, sodium chloride flush  Antimicrobials: Anti-infectives (From admission, onward)   Start     Dose/Rate Route Frequency Ordered Stop   05/14/19 1700  amoxicillin (AMOXIL) capsule 500 mg  Status:  Discontinued     500 mg Oral Every 8 hours 05/14/19 1620 05/20/19 1359       I have personally reviewed the following labs and images: CBC: Recent Labs  Lab 05/14/19 0435 05/14/19 0819 05/15/19 0559 05/15/19 1109 05/17/19 0614 05/17/19 0614 05/17/19 1644 05/18/19 0455 05/18/19 1641 05/19/19 0500 05/20/19 0343  WBC 8.2   < > 8.1  --  7.0  --   --  8.6  --  7.9 7.0  NEUTROABS 5.5  --   --   --   --   --   --   --   --   --   --   HGB 6.5*   < > 7.2*   < > 6.5*   < >  7.0* 7.0* 7.5* 7.3* 7.2*  HCT 21.4*   < > 23.6*   < > 21.9*   < > 23.0* 22.7* 25.0* 23.5* 23.4*  MCV 94.7   < > 94.4  --  97.3  --   --  95.0  --  96.3 97.1  PLT 266   < > 256  --  233  --   --  257  --  260 257   < > = values in this interval not displayed.   BMP &GFR Recent Labs  Lab 05/16/19 0309 05/17/19 0614 05/18/19 0455 05/19/19 0500 05/20/19 0343  NA 132* 133* 137 139 139  K 4.9 3.2* 3.2* 2.9* 2.3*  CL 87* 91* 94* 91* 95*  CO2 29 30 29  32 31  GLUCOSE 113* 109* 108* 109* 108*  BUN 100* 91* 81* 69* 62*  CREATININE 2.84* 2.37* 2.06* 1.91* 1.78*  CALCIUM 8.8* 8.3* 8.3* 8.7* 8.6*  MG 3.0* 2.9* 2.6* 2.4 2.2  PHOS 5.3* 5.1* 4.0 4.1 4.1   Estimated Creatinine Clearance: 57.1 mL/min (A) (by C-G formula based on SCr of 1.78 mg/dL (H)). Liver & Pancreas: Recent Labs  Lab 05/14/19 0435 05/14/19 1210 05/16/19 0309 05/17/19 6578 05/18/19 0455 05/19/19 0500 05/20/19 0343  AST 40  --   --   --   --   --   --   ALT 15  --   --   --   --   --   --   ALKPHOS 140*  --   --   --   --   --   --   BILITOT 1.4*  --   --   --   --   --   --   PROT 7.2  --   --   --   --   --   --   ALBUMIN 2.5*   < > 2.4* 2.3* 2.6* 2.6* 2.5*   < > = values in this interval not displayed.   No results for input(s): LIPASE, AMYLASE in the last 168 hours. Recent Labs  Lab 05/18/19 1641 05/20/19 0343  AMMONIA 110* 96*   Diabetic: No results for input(s): HGBA1C in the last 72 hours. Recent Labs  Lab 05/14/19 1128  GLUCAP 96   Cardiac Enzymes: No results for input(s): CKTOTAL, CKMB, CKMBINDEX, TROPONINI in the last 168 hours. No results for input(s): PROBNP in the last 8760 hours. Coagulation Profile: Recent Labs  Lab 05/16/19 0309 05/17/19 4696 05/18/19 0455 05/19/19  0500 05/20/19 0343  INR 4.8* 2.9* 2.3* 2.3* 2.2*   Thyroid Function Tests: No results for input(s): TSH, T4TOTAL, FREET4, T3FREE, THYROIDAB in the last 72 hours. Lipid Profile: No results for input(s): CHOL, HDL,  LDLCALC, TRIG, CHOLHDL, LDLDIRECT in the last 72 hours. Anemia Panel: No results for input(s): VITAMINB12, FOLATE, FERRITIN, TIBC, IRON, RETICCTPCT in the last 72 hours. Urine analysis:    Component Value Date/Time   COLORURINE YELLOW 03/17/2019 0054   APPEARANCEUR CLOUDY (A) 03/17/2019 0054   LABSPEC 1.006 03/17/2019 0054   PHURINE 6.0 03/17/2019 0054   GLUCOSEU NEGATIVE 03/17/2019 0054   HGBUR SMALL (A) 03/17/2019 0054   BILIRUBINUR NEGATIVE 03/17/2019 0054   KETONESUR NEGATIVE 03/17/2019 0054   PROTEINUR 30 (A) 03/17/2019 0054   NITRITE NEGATIVE 03/17/2019 0054   LEUKOCYTESUR LARGE (A) 03/17/2019 0054   Sepsis Labs: Invalid input(s): PROCALCITONIN, [place]  Microbiology: Recent Results (from the past 240 hour(s))  Respiratory Panel by RT PCR (Flu A&B, Covid) - Nasopharyngeal Swab     Status: None   Collection Time: 05/13/19  8:19 PM   Specimen: Nasopharyngeal Swab  Result Value Ref Range Status   SARS Coronavirus 2 by RT PCR NEGATIVE NEGATIVE Final    Comment: (NOTE) SARS-CoV-2 target nucleic acids are NOT DETECTED. The SARS-CoV-2 RNA is generally detectable in upper respiratoy specimens during the acute phase of infection. The lowest concentration of SARS-CoV-2 viral copies this assay can detect is 131 copies/mL. A negative result does not preclude SARS-Cov-2 infection and should not be used as the sole basis for treatment or other patient management decisions. A negative result may occur with  improper specimen collection/handling, submission of specimen other than nasopharyngeal swab, presence of viral mutation(s) within the areas targeted by this assay, and inadequate number of viral copies (<131 copies/mL). A negative result must be combined with clinical observations, patient history, and epidemiological information. The expected result is Negative. Fact Sheet for Patients:  PinkCheek.be Fact Sheet for Healthcare Providers:   GravelBags.it This test is not yet ap proved or cleared by the [place] FDA and  has been authorized for detection and/or diagnosis of SARS-CoV-2 by FDA under an Emergency Use Authorization (EUA). This EUA will remain  in effect (meaning this test can be used) for the duration of the COVID-19 declaration under Section 564(b)(1) of the Act, 21 U.S.C. section 360bbb-3(b)(1), unless the authorization is terminated or revoked sooner.    Influenza A by PCR NEGATIVE NEGATIVE Final   Influenza B by PCR NEGATIVE NEGATIVE Final    Comment: (NOTE) The Xpert Xpress SARS-CoV-2/FLU/RSV assay is intended as an aid in  the diagnosis of influenza from Nasopharyngeal swab specimens and  should not be used as a sole basis for treatment. Nasal washings and  aspirates are unacceptable for Xpert Xpress SARS-CoV-2/FLU/RSV  testing. Fact Sheet for Patients: PinkCheek.be Fact Sheet for Healthcare Providers: GravelBags.it This test is not yet approved or cleared by the [place] FDA and  has been authorized for detection and/or diagnosis of SARS-CoV-2 by  FDA under an Emergency Use Authorization (EUA). This EUA will remain  in effect (meaning this test can be used) for the duration of the  Covid-19 declaration under Section 564(b)(1) of the Act, 21  U.S.C. section 360bbb-3(b)(1), unless the authorization is  terminated or revoked. Performed at [facility], [place] [address] [place], [place] [address]   Culture, blood (routine x 2)     Status: None (Preliminary result)   Collection Time: 05/14/19  2:28 PM   Specimen: BLOOD RIGHT HAND  Result Value Ref Range Status   Specimen Description BLOOD RIGHT HAND  Final   Special Requests   Final    BOTTLES DRAWN AEROBIC ONLY Blood Culture adequate volume   Culture   Final    NO GROWTH 4 DAYS Performed at [facility], [address], [place], [place]  [address]    Report Status PENDING  Incomplete  Culture, blood (routine x 2)     Status: None (Preliminary result)   Collection Time: 05/14/19  2:34 PM   Specimen: BLOOD LEFT HAND  Result Value Ref Range Status   Specimen Description BLOOD LEFT HAND  Final   Special Requests   Final    BOTTLES DRAWN AEROBIC ONLY Blood Culture adequate volume   Culture   Final    NO GROWTH 4 DAYS Performed at [facility], [place] [address], [place], [place] [address]    Report Status PENDING  Incomplete    Radiology Studies: No results found.  [place] [address] [place]  If 7PM-7AM, please contact night-coverage [email or web address] Password [email or web address] 05/20/2019, 2:00 PM

## 2019-05-20 NOTE — Progress Notes (Signed)
Inpatient Rehabilitation Admissions Coordinator  I will follow at a distance.  Danne Baxter, RN, MSN Rehab Admissions Coordinator [phone] 05/20/2019 10:22 AM

## 2019-05-20 NOTE — Progress Notes (Signed)
[place] for Coumadin / heparin  Indication: Atrial fibrillation and mechanical heart valve  Allergies  Allergen Reactions  . Warfarin And Related Other (See Comments)    COUMADIN or JANTOVEN BRAND ONLY** Per pt, was switched to generic in 2001 and had significantly decreased absorption > INR subtherapeutic> pt suffered a TIA and has been on brand name only since     Patient Measurements: Height: 5\' 11"  (180.3 cm) Weight: (!) 335 lb 12.2 oz (152.3 kg) IBW/kg (Calculated) : 75.3  Vital Signs: Temp: 97.9 F (36.6 C) (02/09 0910) Temp Source: Oral (02/09 0910) BP: 112/56 (02/09 1109) Pulse Rate: 96 (02/09 1109)  Labs: Recent Labs    05/18/19 0455 05/18/19 0455 05/18/19 1641 05/18/19 1641 05/19/19 0500 05/20/19 0343  HGB 7.0*   < > 7.5*   < > 7.3* 7.2*  HCT 22.7*   < > 25.0*  --  23.5* 23.4*  PLT 257  --   --   --  260 257  LABPROT 25.5*  --   --   --  25.4* 24.0*  INR 2.3*  --   --   --  2.3* 2.2*  HEPARINUNFRC  --   --  0.51  --  0.23* 0.63  CREATININE 2.06*  --   --   --  1.91* 1.78*   < > = values in this interval not displayed.    Estimated Creatinine Clearance: 57.1 mL/min (A) (by C-G formula based on SCr of 1.78 mg/dL (H)).    Assessment: 74 y.o. male admitted with CHF, h/o Afib and mechMVR, holding warfarin for possible procedures, on heparin with lower goal for possible recent GIB , h/h 7/23.    INR trending down to 2.2 today after held doses and vitamin K. Heaprin drip 1300  uts/hr  Running in PICC line HL 0.6 at goal  Per RN, last melena episode was 2/5 h/h low stable  Goal of Therapy:  Heparin level: 0.3-0.5 INR 2.5-3.5 Monitor platelets by anticoagulation protocol: Yes   Plan:  Continue to hold warfarin  Continue  heparin  1300 units/hr  Monitor CBC, HL, and for s/sx of bleeding   [name] Pharm.D. CPP, BCPS Clinical Pharmacist [phone] 05/20/2019 12:56 PM     Please check AMION for all [place] phone numbers After 10:00 PM, call [place] [phone]

## 2019-05-20 NOTE — Progress Notes (Addendum)
Advanced Heart Failure Rounding Note   Subjective:    Lethargic today. Ammonia level 96. Getting scheduled lactose tid. RN reports he just had a BM.   Co-ox 86% (via midline PICC). Continues on Lasix gtt 30 mg/hr + diamox. Received dose of metolazone yesterday. -1.6L in UOP. Wt down an additional 6 lb but remains markedly volume overloaded.   SCr improving w/ diuresis, 2.06>>1.91>>1.78. K 2.3. Mg 2.2  Hgb stable at 7.2. INR 2.2.   Objective:   Weight Range:  Vital Signs:   Temp:  [97.5 F (36.4 C)-98.7 F (37.1 C)] 97.9 F (36.6 C) (02/09 0910) Pulse Rate:  [64-122] 122 (02/09 0910) Resp:  [12-24] 24 (02/09 0910) BP: (106-128)/(47-55) 106/47 (02/09 0910) SpO2:  [96 %-100 %] 100 % (02/09 0910) Weight:  [152.3 kg] 152.3 kg (02/09 0300) Last BM Date: 05/20/19  Weight change: Filed Weights   05/18/19 0618 05/19/19 0452 05/20/19 0300  Weight: (!) 155.3 kg (!) 154.9 kg (!) 152.3 kg    Intake/Output:   Intake/Output Summary (Last 24 hours) at 05/20/2019 0174 Last data filed at 05/20/2019 9449 Gross per 24 hour  Intake 927.83 ml  Output 2000 ml  Net -1072.17 ml     Physical Exam: General:  Chronically ill appearing, morbidly obese elderly WM, lethargic but opens eyes to command HEENT: normal  Anicteric  Neck: supple. Difficult to assess JVD (body habitus and bearded) Carotids 2+ bilat; no bruits. No lymphadenopathy or thryomegaly appreciated. Cor: PMI nondisplaced. Irregular rhythm. Regular rate 2/6 TR, 2/6 AS + mech s1 Lungs: decreased BS anteriorly no wheeze Abdomen: obese, soft, nontender, nondistended. No hepatosplenomegaly. No bruits or masses. Good bowel sounds. Extremities: no cyanosis, clubbing, rash, 2 bilateral LEE edema w/ chronic venous stasis dermatitis + bilateral unna boots, upper extremities edematous  Neuro: lethargic this am but responsive, cranial nerves grossly intact. moves all 4 extremities w/o difficulty.    Telemetry: AV paced 60-80s, NSVT  Up  to 5 beats Personally reviewed   Labs: Basic Metabolic Panel: Recent Labs  Lab 05/16/19 0309 05/16/19 0309 05/17/19 6759 05/17/19 0614 05/18/19 0455 05/19/19 0500 05/20/19 0343  NA 132*  --  133*  --  137 139 139  K 4.9  --  3.2*  --  3.2* 2.9* 2.3*  CL 87*  --  91*  --  94* 91* 95*  CO2 29  --  30  --  29 32 31  GLUCOSE 113*  --  109*  --  108* 109* 108*  BUN 100*  --  91*  --  81* 69* 62*  CREATININE 2.84*  --  2.37*  --  2.06* 1.91* 1.78*  CALCIUM 8.8*   < > 8.3*   < > 8.3* 8.7* 8.6*  MG 3.0*  --  2.9*  --  2.6* 2.4 2.2  PHOS 5.3*  --  5.1*  --  4.0 4.1 4.1   < > = values in this interval not displayed.    Liver Function Tests: Recent Labs  Lab 05/14/19 0435 05/14/19 1210 05/16/19 0309 05/17/19 0614 05/18/19 0455 05/19/19 0500 05/20/19 0343  AST 40  --   --   --   --   --   --   ALT 15  --   --   --   --   --   --   ALKPHOS 140*  --   --   --   --   --   --   BILITOT 1.4*  --   --   --   --   --   --  PROT 7.2  --   --   --   --   --   --   ALBUMIN 2.5*   < > 2.4* 2.3* 2.6* 2.6* 2.5*   < > = values in this interval not displayed.   No results for input(s): LIPASE, AMYLASE in the last 168 hours. Recent Labs  Lab 05/18/19 1641 05/20/19 0343  AMMONIA 110* 96*    CBC: Recent Labs  Lab 05/14/19 0435 05/14/19 0819 05/15/19 0559 05/15/19 1109 05/17/19 0614 05/17/19 0614 05/17/19 1644 05/18/19 0455 05/18/19 1641 05/19/19 0500 05/20/19 0343  WBC 8.2   < > 8.1  --  7.0  --   --  8.6  --  7.9 7.0  NEUTROABS 5.5  --   --   --   --   --   --   --   --   --   --   HGB 6.5*   < > 7.2*   < > 6.5*   < > 7.0* 7.0* 7.5* 7.3* 7.2*  HCT 21.4*   < > 23.6*   < > 21.9*   < > 23.0* 22.7* 25.0* 23.5* 23.4*  MCV 94.7   < > 94.4  --  97.3  --   --  95.0  --  96.3 97.1  PLT 266   < > 256  --  233  --   --  257  --  260 257   < > = values in this interval not displayed.    Cardiac Enzymes: No results for input(s): CKTOTAL, CKMB, CKMBINDEX, TROPONINI in the last 168  hours.  BNP: BNP (last 3 results) Recent Labs    03/16/19 2359 05/13/19 1930  BNP 105.7* 163.6*    ProBNP (last 3 results) No results for input(s): PROBNP in the last 8760 hours.    Other results:  Imaging: No results found.   Medications:     Scheduled Medications: . sodium chloride   Intravenous Once  . sodium chloride   Intravenous Once  . acetaminophen  1,000 mg Oral Q8H  . acetaZOLAMIDE  250 mg Oral BID  . amoxicillin  500 mg Oral Q8H  . ferrous sulfate  325 mg Oral Q breakfast  . lactulose  30 g Oral TID  . mouth rinse  15 mL Mouth Rinse BID  . pantoprazole  40 mg Oral BID  . potassium chloride  40 mEq Oral TID  . spironolactone  50 mg Oral Daily    Infusions: . furosemide (LASIX) infusion 30 mg/hr (05/20/19 0514)  . heparin 1,300 Units/hr (05/19/19 1619)  . milrinone 0.125 mcg/kg/min (05/20/19 0730)  . potassium chloride 10 mEq (05/20/19 0854)    PRN Medications: guaiFENesin-dextromethorphan, lip balm, oxyCODONE, polyethylene glycol, sodium chloride flush   Assessment/Plan:   1. 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  - He is massively volume overloaded with severe R-sided HF in the setting of wide open TR (which I suspect is due to torn TV) - Was not responding well to lasix gtt at 20 with diamox. Milrinone added 2/5 - Co-ox 86% on milrinone 0.125.  - Renal function improving with milrinone now 1.78.  Diuresing and Wt down additional 6 lb but still overloaded. - Continue lasix gtt at 30. Continue diamox. Needs aggressive K supplementation (2.3).  - may give another dose of metolazone later today after adequate K supplementation   2. 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  -  not candidate for surgery - no change  3. AKI on CKD  -Baseline creatinine < 2.0  - Was up to 2.8. Milrinone aded 2/5. Improving, now 2.3 -> 2.06 -> 1.9->1.78 - suspect cardiorenal.  - continue milrinone  4.  Anemia  - GI consulted.  - Had colonoscopy in 02/2019 with ext/int hemmorhoids.  - LDH mildly elevated at 300 -> 238. Haptoglobin <10. Suspect component of valvular hemolysis but do not feel that this is high-grade process to explain all of his anemia.  - GI and primary team managing.  - INR 2.2. Cotninue heparin given mech MVR. Discussed dosing with PharmD personally. - Hgb stable at 7.2. transfuse of <7.0   5. Mechanical MVR with moderate MS - INR down to 2.2   - Coumadin has been on hold. Now on heparin  - See discussion abve  6. Moderate AS - medical therapy   7. Obesity  -Body mass index is 47.84 kg/m.  8. Hypokalemia - K 2.3. Mg ok at 2.2 - aggressive supp, 10 mEq IV qh x 3 + PO, 40 mEq TID - hold additional metolazone this am - repeat BMP at 1400   9. Lethargy - Ammonia 96.  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 NSVT - brief runs, up to 5 beats - supp K per above  11. GOC - Worry his short-term prognosis is quite poor.  - Appreciate Palliative input. - He currently remains FULL Code but PMT plans to return today to have further [place] discussions w/ family.    Length of Stay: [place] PA-C 05/20/2019, 9:23 AM  Advanced Heart Failure Team Pager [phone] (M-F; 7a - 4p)  Please contact [facility] for night-coverage after hours (4p -7a ) and weekends on [email or web address]  Patient seen and examined with the above-signed Advanced Practice Provider and/or Housestaff. I personally reviewed laboratory data, imaging studies and relevant notes. I independently examined the patient and formulated the important aspects of the plan. I have edited the note to reflect any of my changes or salient points. I have personally discussed the plan with the patient and/or family.  Remains very tenuous. Lethargic this am. Ammonia elevated. Now on lactulose.   Renal function and co-ox improving on milrinone.   Volume status  improving on high-dose diuretics. K 2.3  Hgb continues to fall. No obvious bleeding. Will repeat LDH  Remains on heparin for mechanical MV  Continue IV lasix and milrinone for now. Supp K very aggressively. Prognosis very tenuous.   Will eventually need formal RHC if we can get him to that point.   Palliative Care involved   Glori Bickers, MD  10:07 AM

## 2019-05-20 NOTE — Progress Notes (Signed)
Daily Progress Note   Patient Name: Frank [name]       Date: 05/20/2019 DOB: 1946/01/29  Age: 74 y.o. MRN#: [id number] Attending Physician: Mercy Riding, MD Primary Care Physician: [name], MD Admit Date: 05/13/2019  Reason for Consultation/Follow-up: Establishing goals of care and Psychosocial/spiritual support  Subjective: Spent approximately 1 hour with wife discussing patient's current status - what is fixable and what is not - as well as plans for the future.  [name] has seen at least two other family members thru end of life.  She acknowledges that she sees some of the signs.  We discussed his heart failure, liver failure and CKD.  [name] comments on how weak he has become.  Unless he improves she will not be able to manage him at home.     We discussed SNF vs Hospice House.  At SNF she would not be able to visit him - this is unacceptable.    We discussed code status  - [name] understands that CPR would be ineffective and inappropriate.  She agrees to DNR code status but does want him supported if he has not arrested.  We talked about prognosis.  [name] estimates he has approximate 1 month - may 6 at the outside.  She plans to call his children tonight to talk with them about it.  We talked about following up on beneficiary information and a will as well as other arrangements.  Empathetic listening and support provided.  All questions answered to the best of my ability.   Assessment: Patient encephalopathic and sleeping.  End stage heart failure.   Patient Profile/HPI:  74 y.o. male  with past medical history of chronic heart failure with preserved ejection fraction, HOCM s/p myomectomy, valvular disease with MVR, atrial fibrillation, pacemaker placement, CKD 4, and morbid  obesity who was admitted on 05/13/2019 with massive fluid overload and LE edema attributed to right sided heart failure.  He was recently hospitaliized for sepsis due to group G strep bacteremia from right lower extremity nonpurulent cellulitis.    Length of Stay: 7  Current Medications: Scheduled Meds:  . sodium chloride   Intravenous Once  . sodium chloride   Intravenous Once  . acetaminophen  1,000 mg Oral Q8H  . acetaZOLAMIDE  250 mg Oral BID  . ferrous sulfate  325 mg Oral Q breakfast  . lactulose  20 g Oral TID  . mouth rinse  15 mL Mouth Rinse BID  . pantoprazole  40 mg Oral BID  . potassium chloride  40 mEq Oral TID  . spironolactone  50 mg Oral Daily    Continuous Infusions: . furosemide (LASIX) infusion 30 mg/hr (05/20/19 1325)  . heparin 1,300 Units/hr (05/19/19 1619)  . milrinone 0.125 mcg/kg/min (05/20/19 0730)  . potassium chloride      PRN Meds: lip balm, oxyCODONE, polyethylene glycol, sodium chloride flush  Physical Exam        Well developed male, sleeping peacefully.  Did not wake to exam. CV rrr with murmur Resp no distress, no frank w/c/r Abdomen obese soft, nd   Vital Signs: BP (!) 112/56   Pulse 96   Temp 97.9 F (36.6 C) (Oral)   Resp 13   Ht 5\' 11"  (1.803 m)   Wt (!) 152.3 kg   SpO2 (!) 84%   BMI 46.83 kg/m  SpO2: SpO2: (!) 84 % O2 Device: O2 Device: Room Air O2 Flow Rate: O2 Flow Rate (L/min): 2 L/min  Intake/output summary:   Intake/Output Summary (Last 24 hours) at 05/20/2019 1504 Last data filed at 05/20/2019 1157 Gross per 24 hour  Intake 1167.83 ml  Output 2300 ml  Net -1132.17 ml   LBM: Last BM Date: 05/20/19 Baseline Weight: Weight: (!) 157.4 kg Most recent weight: Weight: (!) 152.3 kg       Palliative Assessment/Data: 20%      Patient Active Problem List   Diagnosis Date Noted  . Slow transit constipation   . Palliative care encounter   . Palliative care by specialist   . Anemia   . Heme positive stool   .  Anticoagulated   . Acute renal failure superimposed on stage 3b chronic kidney disease ([place]) 05/14/2019  . Hyperkalemia 05/14/2019  . Acute on chronic right heart failure ([place])   . Status post biventricular pacemaker   . Acute CHF (congestive heart failure) ([place]) 05/13/2019  . [place] 03/23/2019  . Hypotension 03/20/2019  . Group G streptococcal infection 03/19/2019  . Bacteremia 03/19/2019  . Sepsis ([place]) 03/17/2019  . UTI (urinary tract infection) 03/17/2019  . Acute respiratory failure with hypoxia ([place]) 03/17/2019  . Acute exacerbation of CHF (congestive heart failure) ([place]) 03/17/2019  . AKI (acute kidney injury) ([place]) 03/17/2019  . Change in bowel habits   . Rectal bleeding   . Pulmonary hypertension ([place]) 12/17/2017  . Complete heart block ([place]) 07/28/2017  . OSA (obstructive sleep apnea) 06/08/2017  . NICM (nonischemic cardiomyopathy) ([place]) 05/28/2017  . Chronic combined systolic and diastolic heart failure ([place]) 01/18/2017  . Dyspnea on exertion 07/12/2015  . Encounter for therapeutic drug monitoring 05/19/2013  . Cardiac pacemaker-St.Jude 06/22/2011  . S/P mitral valve replacement-Mechanical 10/10/2010  . Atrial fibrillation/tachycardia 06/08/2010  . TIA (transient ischemic attack) 06/08/2010  . S/P aortic valve repair 06/08/2010  . Acute on chronic diastolic heart failure ([place]) 08/10/2009  . VENTRICULAR TACHYCARDIA 11/23/2008  . MORBID OBESITY 06/10/2007  . Essential hypertension 06/10/2007  . 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 DYSPNEA 06/10/2007  . CT, CHEST, ABNORMAL 06/10/2007  . MELANOMA, HX OF 06/10/2007    Palliative Care Plan    Recommendations/Plan:  3 day trial of continued aggressive therapy to see if he can improve.  If so we will consider further therapy with the end goal of going home with Palliative care or Hospice.  If  he does not improve and remains as he is now or worse the family will choose [place].   Their first choice is [facility],  Second choice is Insurance underwriter ([place]).  Code status changed to DNR.  Please do not place purple bracelet at this time.  As patient is encephalopathic [facility] visitor policy allows for his support person to be here at all times.  I will talk with his RN about allowing her to stay after visiting hours.  Goals of Care and Additional Recommendations:  Limitations on Scope of Treatment: Full Scope Treatment  Code Status:  DNR  Prognosis:  < 3 months.  Unfortunately given his acute and chronic co morbidities he is at high risk for acute decline and death.  Discharge Planning:  To Be Determined  Care plan was discussed with Wife, RN, TRH MD.  Thank you for allowing the Palliative Medicine Team to assist in the care of this patient.  Total time spent:  75 min Time in:  2:00 pm Time out:  3:15 pm     Greater than 50%  of this time was spent counseling and coordinating care related to the above assessment and plan.  Florentina Jenny, PA-C Palliative Medicine  Please contact Palliative MedicineTeam phone at [phone] for questions and concerns between 7 am - 7 pm.   Please see AMION for individual provider pager numbers.

## 2019-05-21 LAB — RENAL FUNCTION PANEL
Albumin: 2.4 g/dL — ABNORMAL LOW (ref 3.5–5.0)
Anion gap: 12 (ref 5–15)
BUN: 54 mg/dL — ABNORMAL HIGH (ref 8–23)
CO2: 32 mmol/L (ref 22–32)
Calcium: 8.6 mg/dL — ABNORMAL LOW (ref 8.9–10.3)
Chloride: 94 mmol/L — ABNORMAL LOW (ref 98–111)
Creatinine, Ser: 2 mg/dL — ABNORMAL HIGH (ref 0.61–1.24)
GFR calc Af Amer: 38 mL/min — ABNORMAL LOW (ref >60)
GFR calc non Af Amer: 33 mL/min — ABNORMAL LOW (ref >60)
Glucose, Bld: 104 mg/dL — ABNORMAL HIGH (ref 70–99)
Phosphorus: 3.7 mg/dL (ref 2.5–4.6)
Potassium: 2.6 mmol/L — CL (ref 3.5–5.1)
Sodium: 138 mmol/L (ref 135–145)

## 2019-05-21 LAB — CBC
HCT: 24.7 % — ABNORMAL LOW (ref 39.0–52.0)
Hemoglobin: 7.4 g/dL — ABNORMAL LOW (ref 13.0–17.0)
MCH: 30.1 pg (ref 26.0–34.0)
MCHC: 30 g/dL (ref 30.0–36.0)
MCV: 100.4 fL — ABNORMAL HIGH (ref 80.0–100.0)
Platelets: 265 10*3/uL (ref 150–400)
RBC: 2.46 MIL/uL — ABNORMAL LOW (ref 4.22–5.81)
RDW: 20.2 % — ABNORMAL HIGH (ref 11.5–15.5)
WBC: 6.4 10*3/uL (ref 4.0–10.5)
nRBC: 0.6 % — ABNORMAL HIGH (ref 0.0–0.2)

## 2019-05-21 LAB — BASIC METABOLIC PANEL
Anion gap: 13 (ref 5–15)
BUN: 51 mg/dL — ABNORMAL HIGH (ref 8–23)
CO2: 32 mmol/L (ref 22–32)
Calcium: 8.7 mg/dL — ABNORMAL LOW (ref 8.9–10.3)
Chloride: 93 mmol/L — ABNORMAL LOW (ref 98–111)
Creatinine, Ser: 1.95 mg/dL — ABNORMAL HIGH (ref 0.61–1.24)
GFR calc Af Amer: 39 mL/min — ABNORMAL LOW (ref >60)
GFR calc non Af Amer: 34 mL/min — ABNORMAL LOW (ref >60)
Glucose, Bld: 119 mg/dL — ABNORMAL HIGH (ref 70–99)
Potassium: 3.2 mmol/L — ABNORMAL LOW (ref 3.5–5.1)
Sodium: 138 mmol/L (ref 135–145)

## 2019-05-21 LAB — HEPARIN LEVEL (UNFRACTIONATED)
Heparin Unfractionated: 0.93 IU/mL — ABNORMAL HIGH (ref 0.30–0.70)
Heparin Unfractionated: 0.43 IU/mL (ref 0.30–0.70)

## 2019-05-21 LAB — HEPATIC FUNCTION PANEL
ALT: 16 U/L (ref 0–44)
AST: 46 U/L — ABNORMAL HIGH (ref 15–41)
Albumin: 2.4 g/dL — ABNORMAL LOW (ref 3.5–5.0)
Alkaline Phosphatase: 105 U/L (ref 38–126)
Bilirubin, Direct: 0.5 mg/dL — ABNORMAL HIGH (ref 0.0–0.2)
Indirect Bilirubin: 0.7 mg/dL (ref 0.3–0.9)
Total Bilirubin: 1.2 mg/dL (ref 0.3–1.2)
Total Protein: 7 g/dL (ref 6.5–8.1)

## 2019-05-21 LAB — COOXEMETRY PANEL
Carboxyhemoglobin: 3.1 % — ABNORMAL HIGH (ref 0.5–1.5)
Methemoglobin: 1.1 % (ref 0.0–1.5)
O2 Saturation: 94.3 %
Total hemoglobin: 7.9 g/dL — ABNORMAL LOW (ref 12.0–16.0)

## 2019-05-21 LAB — MAGNESIUM: Magnesium: 2.1 mg/dL (ref 1.7–2.4)

## 2019-05-21 LAB — AMMONIA: Ammonia: 92 umol/L — ABNORMAL HIGH (ref 9–35)

## 2019-05-21 LAB — PROTIME-INR
INR: 2.2 — ABNORMAL HIGH (ref 0.8–1.2)
Prothrombin Time: 24.1 seconds — ABNORMAL HIGH (ref 11.4–15.2)

## 2019-05-21 MED ORDER — POTASSIUM CHLORIDE 10 MEQ/100ML IV SOLN
10.0000 meq | INTRAVENOUS | Status: AC
  Administered 2019-05-21 (×4): 10 meq via INTRAVENOUS
  Filled 2019-05-21 (×4): qty 100

## 2019-05-21 MED ORDER — POTASSIUM CHLORIDE 20 MEQ PO PACK
40.0000 meq | PACK | ORAL | Status: AC
  Administered 2019-05-21 (×2): 40 meq via ORAL
  Filled 2019-05-21 (×4): qty 2

## 2019-05-21 MED ORDER — POTASSIUM CHLORIDE CRYS ER 20 MEQ PO TBCR
40.0000 meq | EXTENDED_RELEASE_TABLET | ORAL | Status: DC
  Administered 2019-05-21: 08:00:00 40 meq via ORAL
  Filled 2019-05-21: qty 2

## 2019-05-21 MED ORDER — LACTULOSE 10 GM/15ML PO SOLN
20.0000 g | Freq: Three times a day (TID) | ORAL | Status: DC
  Administered 2019-05-21 – 2019-05-24 (×8): 20 g via ORAL
  Filled 2019-05-21 (×8): qty 30

## 2019-05-21 MED ORDER — POTASSIUM CHLORIDE 10 MEQ/100ML IV SOLN
10.0000 meq | INTRAVENOUS | Status: AC
  Administered 2019-05-22 (×2): 10 meq via INTRAVENOUS
  Filled 2019-05-21 (×2): qty 100

## 2019-05-21 MED ORDER — LACTULOSE 10 GM/15ML PO SOLN: 30.0000 g | Freq: Three times a day (TID) | ORAL | Status: DC

## 2019-05-21 NOTE — Progress Notes (Signed)
Inpatient Rehabilitation Admissions Coordinator  I will sign off at this time.   Danne Baxter, RN, MSN Rehab Admissions Coordinator [phone] 05/21/2019 10:38 AM

## 2019-05-21 NOTE — Progress Notes (Signed)
Chart reviewed.   I chatted with [name] on the phone for a while and attempted to provide support.  Per [name] is a touch better today.    [name] is working to give Husam his pills - it is taking quite some time.  She and her family are doing an excellent job of keeping Gunner's spirits up.  [name] requested the ability to bring Reyce heart healthy food from home. I will place an order to allow her to do so.  I'll follow up with Francee Piccolo and [name] on Saturday afternoon to assess his progress.  If any needs arise prior to Saturday please do not hesitate to call our office.  [name] has our office number as well.  Florentina Jenny, PA-C Palliative Medicine Office:  [phone]  No charge note.

## 2019-05-21 NOTE — Progress Notes (Signed)
Occupational Therapy Treatment Patient Details Name: Frank [name] MRN: [id number] DOB: 1945/09/04 Today's Date: 05/21/2019    History of present illness Pt is a 74 y/o male with PMH of diverticulosis, hemorrhoids, a fib, complete heart block s/p PPM, hypertropic obstructive cardiomyopathy s/p myectomy with mechanical mitral valve replacement and aortic valve repair, CHF, HTN, TIA, LB lymphedema presenting to ED, SOB and back pain.  Pt with cellulitis of R LE, CHF, and questionable infection.   OT comments  Patient demonstrated minimal progress towards goals in skilled OT session. Patient's session encompassed introduction of AE, and bed mobility. Pt at EOB upon therapist's arrival with spouse present. Pt oriented, however would nod off and required increased cues to stay awake and sustain attention. Pt required max verbal cues for sequencing as well as mod/max A in order to use reacher and sock aid (would benefit greatly from further education). Pt wanting to get back in bed at end of session, and utilized spouse and 2 hand held assist to power up to stand (mod A) and then took 4 steps to head of bed. Pt required mod/max A of 2 to bring legs back in bed. At this time, pt does not have the sustained energy to transition to CIR, and per spousal report "the prognosis is not looking good". Pt would continue to benefit from therapy acutely in order to regain strength and endurance in order to participate in ADL tasks. Will continue to follow.    Follow Up Recommendations  SNF(Pt not progressing to sustain activity at CIR)    Equipment Recommendations  Other (comment)(defer to next venue of care)    Recommendations for Other Services      Precautions / Restrictions Precautions Precautions: Fall Restrictions Weight Bearing Restrictions: No       Mobility Bed Mobility Overal bed mobility: Needs Assistance         Sit to supine: +2 for physical assistance;Mod assist   General bed  mobility comments: for LE management and to elevate trunk to EOB; use of bed features and rails, increased time and effort  Transfers Overall transfer level: Needs assistance Equipment used: 2 person hand held assist(family present in room, requested 2 person handheld assist) Transfers: Sit to/from Stand Sit to Stand: Mod assist;+2 physical assistance         General transfer comment: stood with +2 assistance with spouse, able to take 4 side steps towards [facility]    Balance Overall balance assessment: Needs assistance Sitting-balance support: Feet supported;Bilateral upper extremity supported Sitting balance-Leahy Scale: Good     Standing balance support: Bilateral upper extremity supported;During functional activity Standing balance-Leahy Scale: Poor Standing balance comment: Able to stand, however required 2 handheld assist                           ADL either performed or assessed with clinical judgement   ADL Overall ADL's : Needs assistance/impaired                     Lower Body Dressing: Maximal assistance;With adaptive equipment;Cueing for sequencing Lower Body Dressing Details (indicate cue type and reason): Introduced Microbiologist, required max A and max vc's for thoroughness Toilet Transfer: +2 for physical assistance;Moderate assistance;Cueing for sequencing Toilet Transfer Details (indicate cue type and reason): Simulated with caregiver in standing with plus 2 mod A, side steps to [facility]         Functional mobility during ADLs: Maximal  assistance;Cueing for safety General ADL Comments: decreased strength, activity tolerance this session requiring increased assistance, severely limited by fatigue     Vision Baseline Vision/History: No visual deficits Patient Visual Report: No change from baseline Vision Assessment?: No apparent visual deficits   Perception     Praxis      Cognition Arousal/Alertness: Lethargic Behavior During  Therapy: Flat affect;WFL for tasks assessed/performed Overall Cognitive Status: Impaired/Different from baseline Area of Impairment: Following commands                   Current Attention Level: Selective   Following Commands: Follows one step commands inconsistently       General Comments: Pt demonstrating extreme fatigue in session, but oriented x4, required cues to regain attention to task        Exercises     Shoulder Instructions       General Comments      Pertinent Vitals/ Pain       Pain Assessment: Faces Faces Pain Scale: Hurts whole lot Pain Location: Left abdomen Pain Descriptors / Indicators: Discomfort;Aching;Grimacing;Guarding;Moaning;Shooting Pain Intervention(s): Limited activity within patient's tolerance;Monitored during session;Repositioned  Home Living                                          Prior Functioning/Environment              Frequency  Min 2X/week        Progress Toward Goals  OT Goals(current goals can now be found in the care plan section)     Acute Rehab OT Goals Patient Stated Goal: to feel stronger and to sleep OT Goal Formulation: With patient Time For Goal Achievement: 05/29/19 Potential to Achieve Goals: Good  Plan Discharge plan needs to be updated    Co-evaluation          OT goals addressed during session: Proper use of Adaptive equipment and DME;ADL's and self-care      AM-PAC OT "6 Clicks" Daily Activity     Outcome Measure   Help from another person eating meals?: A Little Help from another person taking care of personal grooming?: A Little Help from another person toileting, which includes using toliet, bedpan, or urinal?: A Lot Help from another person bathing (including washing, rinsing, drying)?: A Lot Help from another person to put on and taking off regular upper body clothing?: A Little Help from another person to put on and taking off regular lower body clothing?: A  Lot 6 Click Score: 15    End of Session Equipment Utilized During Treatment: Other (comment)(2 hand held assist with sposue per spousal request)  OT Visit Diagnosis: Unsteadiness on feet (R26.81)   Activity Tolerance Patient limited by lethargy;Patient limited by fatigue   Patient Left in bed;with call bell/phone within reach   Nurse Communication Mobility status(Replace condom cath)        Time: 0355-9741 OT Time Calculation (min): 27 min  Charges: OT General Charges $OT Visit: 1 Visit OT Treatments $Self Care/Home Management : 23-37 mins  Mims. Zeola Brys, COTA/L Acute Rehabilitation Services [place] 05/21/2019, 12:19 PM

## 2019-05-21 NOTE — Progress Notes (Signed)
[place] for Coumadin / heparin  Indication: Atrial fibrillation and mechanical heart valve  Allergies  Allergen Reactions  . Warfarin And Related Other (See Comments)    COUMADIN or JANTOVEN BRAND ONLY** Per pt, was switched to generic in 2001 and had significantly decreased absorption > INR subtherapeutic> pt suffered a TIA and has been on brand name only since     Patient Measurements: Height: 5\' 11"  (180.3 cm) Weight: (!) 330 lb 0.5 oz (149.7 kg) IBW/kg (Calculated) : 75.3  Vital Signs: Temp: 98.4 F (36.9 C) (02/10 0612) Temp Source: Oral (02/10 0612) BP: 110/50 (02/10 0612) Pulse Rate: 63 (02/10 0612)  Labs: Recent Labs    05/19/19 0500 05/19/19 0500 05/20/19 0343 05/20/19 1350 05/21/19 0500 05/21/19 0952  HGB 7.3*   < > 7.2*  --  7.4*  --   HCT 23.5*  --  23.4*  --  24.7*  --   PLT 260  --  257  --  265  --   LABPROT 25.4*  --  24.0*  --  24.1*  --   INR 2.3*  --  2.2*  --  2.2*  --   HEPARINUNFRC 0.23*   < > 0.63  --  0.93* 0.43  CREATININE 1.91*   < > 1.78* 1.86* 2.00*  --    < > = values in this interval not displayed.    Estimated Creatinine Clearance: 50.4 mL/min (A) (by C-G formula based on SCr of 2 mg/dL (H)).    Assessment: 74 y.o. male admitted with CHF, h/o Afib and mechMVR, holding warfarin for possible procedures, on heparin with lower goal for possible recent GIB , h/h 7/23.    INR trending down to 2.2  after held doses since admit  and vitamin K. Heaprin drip 1300  uts/hr HL 0.4 at goal.  No bleeding, low h/h stable   Goal of Therapy:  Heparin level: 0.3-0.5 INR 2.5-3.5 Monitor platelets by anticoagulation protocol: Yes   Plan:  Continue to hold warfarin  Continue  heparin  1300 units/hr  Monitor CBC, HL, and for s/sx of bleeding   [name] Pharm.D. CPP, BCPS Clinical Pharmacist [phone] 05/21/2019 10:56 AM     Please check AMION for all [place] phone numbers After 10:00 PM, call  [place] [phone]

## 2019-05-21 NOTE — Progress Notes (Signed)
Advanced Heart Failure Rounding Note   Subjective:    Remains weak.Tremulous  Ammonia 96 -> 92  On milrinone 0.125 and lasix gtt at 30. Arlyce Harman added yesterday   Co-ox 94% (?). Creatinine up 1.86 -> 2.0   Weight down 5 pounds overnigh   SCr  2.06>>1.91>>1.78 -> 1.86 -> 2.0. K 2.6. Mg 2.2  Hgb stable at 7.2 -> 7.4. INR 2.2. (on heparin) No melena  Objective:   Weight Range:  Vital Signs:   Temp:  [97.5 F (36.4 C)-98.4 F (36.9 C)] 98.4 F (36.9 C) (02/10 0612) Pulse Rate:  [43-122] 63 (02/10 0612) Resp:  [13-24] 16 (02/10 0612) BP: (106-122)/(47-81) 110/50 (02/10 0612) SpO2:  [84 %-100 %] 95 % (02/10 0612) Weight:  [118.9 kg] 118.9 kg (02/10 0615) Last BM Date: 05/20/19  Weight change: Filed Weights   05/19/19 0452 05/20/19 0300 05/21/19 0615  Weight: (!) 154.9 kg (!) 152.3 kg 118.9 kg    Intake/Output:   Intake/Output Summary (Last 24 hours) at 05/21/2019 0840 Last data filed at 05/21/2019 0347 Gross per 24 hour  Intake 975.4 ml  Output 3150 ml  Net -2174.6 ml     Physical Exam: General:  Chronically ill appearing, morbidly obese  tremulous HEENT: normal  Anicteric  Neck: supple. JVP to jaw. Carotids 2+ bilat; no bruits. No lymphadenopathy or thryomegaly appreciated. Cor: PMI nondisplaced. Regular rate & rhythm. 2/6 AS, 2/6 TR mech s1 Lungs: clear Abdomen: obese soft, nontender, nondistended. No hepatosplenomegaly. No bruits or masses. Good bowel sounds. Extremities: no cyanosis, clubbing, rash, 3-4+ edema into thighs Neuro: alert & orientedx3, cranial nerves grossly intact. moves all 4 extremities w/o difficulty. Affect pleasant + asterixis  Telemetry: AV paced 60-80s,   Multiple runs SVT Personally reviewed   Labs: Basic Metabolic Panel: Recent Labs  Lab 05/17/19 0614 05/17/19 0614 05/18/19 0455 05/18/19 0455 05/19/19 0500 05/19/19 0500 05/20/19 0343 05/20/19 1350 05/21/19 0500  NA 133*   < > 137  --  139  --  139 138 138  K 3.2*   < >  3.2*  --  2.9*  --  2.3* 2.6* 2.6*  CL 91*   < > 94*  --  91*  --  95* 95* 94*  CO2 30   < > 29  --  32  --  31 30 32  GLUCOSE 109*   < > 108*  --  109*  --  108* 120* 104*  BUN 91*   < > 81*  --  69*  --  62* 58* 54*  CREATININE 2.37*   < > 2.06*  --  1.91*  --  1.78* 1.86* 2.00*  CALCIUM 8.3*   < > 8.3*   < > 8.7*   < > 8.6* 8.3* 8.6*  MG 2.9*  --  2.6*  --  2.4  --  2.2  --  2.1  PHOS 5.1*  --  4.0  --  4.1  --  4.1  --  3.7   < > = values in this interval not displayed.    Liver Function Tests: Recent Labs  Lab 05/17/19 0614 05/18/19 0455 05/19/19 0500 05/20/19 0343 05/21/19 0500  ALBUMIN 2.3* 2.6* 2.6* 2.5* 2.4*   No results for input(s): LIPASE, AMYLASE in the last 168 hours. Recent Labs  Lab 05/18/19 1641 05/20/19 0343 05/21/19 0500  AMMONIA 110* 96* 92*    CBC: Recent Labs  Lab 05/17/19 0614 05/17/19 1644 05/18/19 0455 05/18/19 1641 05/19/19 0500 05/20/19  0343 05/21/19 0500  WBC 7.0  --  8.6  --  7.9 7.0 6.4  HGB 6.5*   < > 7.0* 7.5* 7.3* 7.2* 7.4*  HCT 21.9*   < > 22.7* 25.0* 23.5* 23.4* 24.7*  MCV 97.3  --  95.0  --  96.3 97.1 100.4*  PLT 233  --  257  --  260 257 265   < > = values in this interval not displayed.    Cardiac Enzymes: No results for input(s): CKTOTAL, CKMB, CKMBINDEX, TROPONINI in the last 168 hours.  BNP: BNP (last 3 results) Recent Labs    03/16/19 2359 05/13/19 1930  BNP 105.7* 163.6*    ProBNP (last 3 results) No results for input(s): PROBNP in the last 8760 hours.    Other results:  Imaging: No results found.   Medications:     Scheduled Medications: . sodium chloride   Intravenous Once  . sodium chloride   Intravenous Once  . acetaminophen  1,000 mg Oral Q8H  . acetaZOLAMIDE  250 mg Oral BID  . ferrous sulfate  325 mg Oral Q breakfast  . lactulose  30 g Oral TID  . mouth rinse  15 mL Mouth Rinse BID  . pantoprazole  40 mg Oral BID  . potassium chloride  40 mEq Oral Q3H  . spironolactone  50 mg Oral  Daily    Infusions: . furosemide (LASIX) infusion 30 mg/hr (05/20/19 2331)  . heparin 1,300 Units/hr (05/20/19 2219)  . milrinone 0.125 mcg/kg/min (05/21/19 0400)  . potassium chloride 10 mEq (05/21/19 0811)    PRN Medications: lip balm, oxyCODONE, polyethylene glycol, sodium chloride flush   Assessment/Plan:   1. 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  - Remains markedly volume overloaded with severe R-sided HF in the setting of wide open TR (which I suspect is due to torn TV) - Was not responding well to lasix gtt at 20 with diamox. Milrinone added 2/5 - Co-ox 90% on milrinone 0.125.  - Renal function improved with milrinone. Now trending back up after addition of spiro. Continue to diurese as toelrated - Continue lasix gtt at 30. Continue diamox. Supp K aggressively  2. 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  - not candidate for surgery - no change  3. AKI on CKD  -Baseline creatinine < 2.0  - Was up to 2.8. Milrinone aded 2/5. Cr trend 2.3 -> 2.06 -> 1.9->1.78-. 2.0 - suspect cardiorenal.  - continue milrinone  4. Anemia  - GI consulted.  - Had colonoscopy in 02/2019 with ext/int hemmorhoids.  - LDH mildly elevated at 300 -> 238. Haptoglobin <10. Suspect component of valvular hemolysis but do not feel that this is high-grade process to explain all of his anemia.  - Primary team managing.  - INR 2.2. Cotninue heparin given mech MVR. Discussed dosing with PharmD personally. - Hgb stable at 7.4. transfuse of <7.0   5. Mechanical MVR with moderate MS - INR down to 2.2   - Coumadin has been on hold. Now on heparin  - See discussion abve  6. Moderate AS - medical therapy   7. Obesity  -Body mass index is 47.84 kg/m.  8. Hypokalemia - K 2.6. Mg ok at 2.2 - continue aggressive supp - spiro added - hold additional metolazone this am - repeat BMP at 1400   9. Lethargy due to hepatic encephalopathy -  Ammonia 96 -. 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 NSVT/SVT - supp K per above  11. GOC - Continues to deteriorata - Short-term prognosis is quite poor.  - Appreciate Palliative input. I believe strongly he needs Hospice but they are not there yet   Length of Stay: 8   Glori Bickers MD 05/21/2019, 8:40 AM  Advanced Heart Failure Team Pager [phone] (M-F; [place])  Please contact [facility] for night-coverage after hours (4p -7a ) and weekends on [email or web address]

## 2019-05-21 NOTE — Progress Notes (Signed)
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  Pt with cellulitis of R LE, CHF, and questionable infection.    PT Comments    Patient received in bed, extremely fatigued and very lethargic. Spouse present, reports he was doing OK then a wave of fatigue just hit him hard around 1:45pm. He is fading in and of out sleep, smiling and nodding to jokes made by PT and tech, but adamantly shaking his head "no" when asked if he would like to try PT this afternoon. Focused session on discussing POC with his spouse while he slept- at this point CIR is not viably in the picture, they do not want to go to SNF as she/family will not be able to visit, but from conversations with other providers hospice may be an option. Also discussed how hard they would like therapy to push at this point given his poor prognosis, and she states that as long as mobility is something that will potentially  help him they do want [name] to keep working with him. Their ultimate goal is to try to get to their [place] home if able and if this is feasible medically. Updated recommendations as appropriate today. Will continue to follow as long as he is medically appropriate and he/family are agreeable.   Patient suffers from poor functional activity tolerance and muscle weakness which impairs their ability to perform daily activities like ambulate in the home.  A walker alone will not resolve the issues with performing activities of daily living. A wheelchair will allow patient to safely perform daily activities.  The patient can self propel in the home or has a  caregiver who can provide assistance.      Follow Up Recommendations  Other (comment);Supervision/Assistance - 24 hour(potentially hospice; HHPT if hospice allows)     Equipment Recommendations  Rolling walker with 5" wheels;3in1 (PT);Wheelchair (measurements PT);Wheelchair cushion (measurements PT);Hospital bed    Recommendations for Other Services       Precautions / Restrictions Precautions Precautions: Fall Restrictions Weight Bearing Restrictions: No    Mobility  Bed Mobility Overal bed mobility: Needs Assistance     General bed mobility comments: defer- fatigue/lethargy  Transfers         General transfer comment: defer- fatigue/lethargy  Ambulation/Gait             General Gait Details: deferred- fatigue/lethargy   Stairs             Wheelchair Mobility    Modified Rankin (Stroke Patients Only)       Balance Overall balance assessment: Needs assistance Sitting-balance support: Feet supported;Bilateral upper extremity supported Sitting balance-Leahy Scale: Good     Standing balance support: Bilateral upper extremity supported;During functional activity Standing balance-Leahy Scale: Poor Standing balance comment: Able to stand, however required 2 handheld assist                            Cognition Arousal/Alertness: Lethargic Behavior During Therapy: Flat affect Overall Cognitive Status: Impaired/Different from baseline Area of Impairment: Following commands  Current Attention Level: Selective   Following Commands: Follows one step commands inconsistently       General Comments: extremely fatigued and lethargic, drifting in and out of sleep; adamantly declined OOB activity due to fatigue      Exercises      General Comments General comments (skin integrity, edema, etc.): session focus on POC discussion with spouse, patient too fatigued and lethargic to do any activity this afternoon       Pertinent Vitals/Pain Pain Assessment: Faces Faces Pain Scale: Hurts little more Pain Location: Left abdomen/generalized Pain Descriptors / Indicators: Discomfort;Aching;Grimacing;Guarding;Moaning;Shooting Pain Intervention(s): Limited activity within patient's tolerance    Home Living                      Prior Function            PT Goals (current goals can now be found in the care plan section) Acute Rehab PT Goals Patient Stated Goal: to feel stronger and to sleep PT Goal Formulation: With patient Time For Goal Achievement: 05/29/19 Potential to Achieve Goals: Good Progress towards PT goals: Not progressing toward goals - comment(limited by fatigue/lethargy)    Frequency    Min 3X/week      PT Plan Discharge plan needs to be updated;Equipment recommendations need to be updated    Co-evaluation            AM-PAC PT "6 Clicks" Mobility   Outcome Measure  Help needed turning from your back to your side while in a flat bed without using bedrails?: A Little Help needed moving from lying on your back to sitting on the side of a flat bed without using bedrails?: A Lot Help needed moving to and from a bed to a chair (including a wheelchair)?: A Lot Help needed standing up from a chair using your arms (e.g., wheelchair or bedside chair)?: A Lot Help needed to walk in hospital room?: A Lot Help needed climbing 3-5 steps with a railing? : A Lot 6 Click Score: 13    End of Session   Activity Tolerance: Patient limited by fatigue;Patient limited by lethargy Patient left: in bed;with call bell/phone within reach;with bed alarm set;with family/visitor present   PT Visit Diagnosis: Muscle weakness (generalized) (M62.81);Difficulty in walking, not elsewhere classified (R26.2)     Time: 9728-2060 PT Time Calculation (min) (ACUTE ONLY): 18 min  Charges:     No charge visit                    Windell Norfolk, DPT, PN1   Supplemental Physical Therapist [place]    Pager [phone] Acute Rehab Office [phone]

## 2019-05-21 NOTE — Progress Notes (Signed)
PROGRESS NOTE  Frank [name] JJO:[id number] DOB: [birth year]   PCP: [name], MD  Patient is from: Home.  Lives with his wife.  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 Fib, lymphedema, CKD-4, debility, morbid obesity, recurrent hospitalization for sepsis secondary to Streptococcus from RLE cellulitis presenting with progressive dyspnea and weight gain.   In ED, hemodynamically stable except for brief tachycardia to 124.  Saturation 98% on room air. CXR consistent with CHF.  K7.0. Cr 2.67 (baseline~1.8).  INR 7.2.  Hgb 7.2 (baseline about 8.0).  EKG revealed paced rhythm.  COVID-19 negative.  Received IV Lasix 120 mg once and was started on Lasix drip by cardiologist.  Also received bicarb, IV insulin and D50, and admitted for CHF exacerbation, AKI, hyperkalemia, supratherapeutic INR and anemia.  Cardiology consulted and started on Lasix drip.  Hypokalemia resolved with Lokelma and IV Lasix.  INR improved after vitamin K.  Hemoglobin dropped requiring intermittent blood transfusions.  FOBT positive.  GI consulted and not planning EGD given his cardiac comorbidity unless signs of profuse bleeding.  Anticoagulation resumed with heparin drip.  Palliative care consulted and met with patient and wife on 2/9.  CODE STATUS changed to DNR/DNI. Plan to readdress [place] 3 days later if no improvement.  Subjective: No major events overnight or this morning.  Complaining about the taste of KCl and leg pain from the Ace wrap.  He denies chest pain or shortness of breath.  He is on room air.  He is awake, alert and oriented x4 this morning.   Objective: Vitals:   05/20/19 2019 05/21/19 0612 05/21/19 0615 05/21/19 0851  BP: 122/81 (!) 110/50    Pulse: (!) 43 63    Resp: 14 16    Temp: (!) 97.5 F (36.4 C) 98.4 F (36.9 C)    TempSrc: Oral Oral    SpO2: 94% 95%    Weight:   118.9 kg (!) 149.7 kg   Height:        Intake/Output Summary (Last 24 hours) at 05/21/2019 1132 Last data filed at 05/21/2019 1601 Gross per 24 hour  Intake 975.4 ml  Output 2500 ml  Net -1524.6 ml   Filed Weights   05/20/19 0300 05/21/19 0615 05/21/19 0851  Weight: (!) 152.3 kg 118.9 kg (!) 149.7 kg    Examination:  GENERAL: Chronically ill-appearing.  No apparent distress. HEENT: MMM.  Vision and hearing grossly intact.  NECK: Supple.  Notable JVD. RESP:  No IWOB.  Fair aeration bilaterally but limited exam due to body habitus. CVS:  RRR.  2/6 SEM over RUSB and LUSB. ABD/GI/GU: Bowel sounds present. Soft. Non tender.  MSK/EXT:  Moves extremities.  2+ edema bilaterally, right > left SKIN: no apparent skin lesion or wound.  Ace wrap over lower extremities. NEURO: Awake, alert and oriented appropriately.  No apparent focal neuro deficit other than generalized weakness.Marland Kitchen PSYCH: Somewhat anxious.  Procedures:  None  Assessment & Plan: Acute on chronic diastolic CHF: Echo in 09/32 with EF of 55 to 60%, moderate LVH, moderate LAE and severe RAE, moderate to severe TVR.  Had significant fluid overload with massive lymphedema.  Presented with dyspnea and weight gain.  CXR concerning for CHF.  3.2 L UOP/24 hours.  Creatinine slightly up. -Advanced HF team managing- IV Lasix drip, diamox and milrinone.  -Added Aldactone due to hypokalemia -Monitor fluid status, renal function and electrolytes -Sodium and fluid  restrictions.  Acute metabolic encephalopathy likely due to hepatic encephalopathy: Ammonia 110> 98> 92.  No documented history of cirrhosis but at risk due to morbid obesity.  ABG reassuring.  AAO x4 this morning.  -Continue lactulose to 20 g 3 times daily-had 4 BMs the last 24hrs -Minimal oxygen to maintain saturation above 90%.  AKI on CKD-4/azotemia: Baseline  Cr 1.8-2.0 > 2.67 (admit) >> 2.84>>1.91> 1.78> 2.0.  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 -Continue monitoring.  Acute on chronic blood loss  anemia in in the setting of supratherapeutic INR: Hgb ~8.0 (baseline)> 7.2 (admit)>> 6.5>>4u>7.0>> 7.2>7.4.   INR 7.6>>2.2. FOBT positive.  Elevated LDH and low haptoglobin concerning for some element of valvular hemolysis.  H&H low but seems to be stable now. 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  Not a candidate for surgery. Supratherapeutic INR: INR 7.2> 7.6> vitamin K>> 4.8> 2.2 -Warfarin on hold. -Heparin drip  Paroxysmal atrial fibrillation: NSR.  Not on rate or rhythm control. -Anticoagulation as above  Hyponatremia: Likely due to renal failure.  Resolved.  Hypokalemia: K 2.6.  Likely due to diuretics and lactulose.  Magnesium within normal. -IV KCl 40 mEq + p.o. KCl 40 mEq 3 times daily -Continue Aldactone 50 mg daily. -Recheck BMP in the afternoon  At risk for sleep apnea/OHS: Body mass index is 46.03 kg/m.  Refuses nightly CPAP.  Recent ABG without significant hypercarbia. -Discontinued   Recent hospitalization for group G Streptococcus bacteremia from RLE cellulitis in patient with lymphedema -Completed antibiotic course with penicillin G followed by oral Zyvox for a total of 14 days through 12/22. -Evaluated by ID, Dr. Tommy Medal on 1/11 and prescribed Augmentin for 2 more weeks with refill. -Blood cultures negative x2 this admission. -Received amoxicillin 2/3-2/9.  Monitor off antibiotics.   Chronic pain:  -Continue scheduled Tylenol with as needed oxycodone.   Generalized weakness/debility -PT/OT-recommended CIR.  Lymphedema/possible right LE cellulitis: Improving. -Diuretics as above. -Appreciate WOC  Morbid obesity: Body mass index is 46.03 kg/m. -Encourage lifestyle change to lose weight  Goal of  care/DNR/DNI: Patient is full code.  Significant comorbidities as above poor prognosis.  Entertain palliative care consult, and was interested.  Palliative care consulted and met with patient and family.   -2/9-DNR/DNI and readdress GOC 3 days later if no improvement.         DVT prophylaxis: On heparin drip Code Status: DNR/DNI Family Communication: Updated patient's wife at bedside.  Discharge barrier: Complex multiple medical issues as above Patient is from: Home Final disposition: To be determined based on clinical progress  Consultants: Cardiology   Microbiology summarized: [id number] and influenza PCR negative. Blood cultures negative so far  Sch Meds:  Scheduled Meds: . sodium chloride   Intravenous Once  . sodium chloride   Intravenous Once  . acetaminophen  1,000 mg Oral Q8H  . acetaZOLAMIDE  250 mg Oral BID  . ferrous sulfate  325 mg Oral Q breakfast  . lactulose  20 g Oral TID  . mouth rinse  15 mL Mouth Rinse BID  . pantoprazole  40 mg Oral BID  . potassium chloride  40 mEq Oral Q3H  . spironolactone  50 mg Oral Daily   Continuous Infusions: . furosemide (LASIX) infusion 30 mg/hr (05/21/19 0854)  .  heparin 1,300 Units/hr (05/20/19 2219)  . milrinone 0.125 mcg/kg/min (05/21/19 0400)   PRN Meds:.lip balm, oxyCODONE, polyethylene glycol, sodium chloride flush  Antimicrobials: Anti-infectives (From admission, onward)   Start     Dose/Rate Route Frequency Ordered Stop   05/14/19 1700  amoxicillin (AMOXIL) capsule 500 mg  Status:  Discontinued     500 mg Oral Every 8 hours 05/14/19 1620 05/20/19 1359       I have personally reviewed the following labs and images: CBC: Recent Labs  Lab 05/17/19 0614 05/17/19 1644 05/18/19 0455 05/18/19 1641 05/19/19 0500 05/20/19 0343 05/21/19 0500  WBC 7.0  --  8.6  --  7.9 7.0 6.4  HGB 6.5*   < > 7.0* 7.5* 7.3* 7.2* 7.4*  HCT 21.9*   < > 22.7* 25.0* 23.5* 23.4* 24.7*  MCV 97.3  --  95.0  --  96.3 97.1 100.4*  PLT  233  --  257  --  260 257 265   < > = values in this interval not displayed.   BMP &GFR Recent Labs  Lab 05/17/19 0614 05/17/19 0614 05/18/19 0455 05/19/19 0500 05/20/19 0343 05/20/19 1350 05/21/19 0500  NA 133*   < > 137 139 139 138 138  K 3.2*   < > 3.2* 2.9* 2.3* 2.6* 2.6*  CL 91*   < > 94* 91* 95* 95* 94*  CO2 30   < > 29 32 31 30 32  GLUCOSE 109*   < > 108* 109* 108* 120* 104*  BUN 91*   < > 81* 69* 62* 58* 54*  CREATININE 2.37*   < > 2.06* 1.91* 1.78* 1.86* 2.00*  CALCIUM 8.3*   < > 8.3* 8.7* 8.6* 8.3* 8.6*  MG 2.9*  --  2.6* 2.4 2.2  --  2.1  PHOS 5.1*  --  4.0 4.1 4.1  --  3.7   < > = values in this interval not displayed.   Estimated Creatinine Clearance: 50.4 mL/min (A) (by C-G formula based on SCr of 2 mg/dL (H)). Liver & Pancreas: Recent Labs  Lab 05/17/19 0614 05/18/19 0455 05/19/19 0500 05/20/19 0343 05/21/19 0500  ALBUMIN 2.3* 2.6* 2.6* 2.5* 2.4*   No results for input(s): LIPASE, AMYLASE in the last 168 hours. Recent Labs  Lab 05/18/19 1641 05/20/19 0343 05/21/19 0500  AMMONIA 110* 96* 92*   Diabetic: No results for input(s): HGBA1C in the last 72 hours. No results for input(s): GLUCAP in the last 168 hours. Cardiac Enzymes: No results for input(s): CKTOTAL, CKMB, CKMBINDEX, TROPONINI in the last 168 hours. No results for input(s): PROBNP in the last 8760 hours. Coagulation Profile: Recent Labs  Lab 05/17/19 0614 05/18/19 0455 05/19/19 0500 05/20/19 0343 05/21/19 0500  INR 2.9* 2.3* 2.3* 2.2* 2.2*   Thyroid Function Tests: No results for input(s): TSH, T4TOTAL, FREET4, T3FREE, THYROIDAB in the last 72 hours. Lipid Profile: No results for input(s): CHOL, HDL, LDLCALC, TRIG, CHOLHDL, LDLDIRECT in the last 72 hours. Anemia Panel: No results for input(s): VITAMINB12, FOLATE, FERRITIN, TIBC, IRON, RETICCTPCT in the last 72 hours. Urine analysis:    Component Value Date/Time   COLORURINE YELLOW 03/17/2019 0054   APPEARANCEUR CLOUDY (A)  03/17/2019 0054   LABSPEC 1.006 03/17/2019 0054   PHURINE 6.0 03/17/2019 0054   GLUCOSEU NEGATIVE 03/17/2019 0054   HGBUR SMALL (A) 03/17/2019 0054   BILIRUBINUR NEGATIVE 03/17/2019 [place] 03/17/2019 0054   PROTEINUR 30 (A) 03/17/2019 0054   NITRITE NEGATIVE 03/17/2019 0054  LEUKOCYTESUR LARGE (A) 03/17/2019 0054   Sepsis Labs: Invalid input(s): PROCALCITONIN, [place]  Microbiology: Recent Results (from the past 240 hour(s))  Respiratory Panel by RT PCR (Flu A&B, Covid) - Nasopharyngeal Swab     Status: None   Collection Time: 05/13/19  8:19 PM   Specimen: Nasopharyngeal Swab  Result Value Ref Range Status   SARS Coronavirus 2 by RT PCR NEGATIVE NEGATIVE Final    Comment: (NOTE) SARS-CoV-2 target nucleic acids are NOT DETECTED. The SARS-CoV-2 RNA is generally detectable in upper respiratoy specimens during the acute phase of infection. The lowest concentration of SARS-CoV-2 viral copies this assay can detect is 131 copies/mL. A negative result does not preclude SARS-Cov-2 infection and should not be used as the sole basis for treatment or other patient management decisions. A negative result may occur with  improper specimen collection/handling, submission of specimen other than nasopharyngeal swab, presence of viral mutation(s) within the areas targeted by this assay, and inadequate number of viral copies (<131 copies/mL). A negative result must be combined with clinical observations, patient history, and epidemiological information. The expected result is Negative. Fact Sheet for Patients:  PinkCheek.be Fact Sheet for Healthcare Providers:  GravelBags.it This test is not yet ap proved or cleared by the [place] FDA and  has been authorized for detection and/or diagnosis of SARS-CoV-2 by FDA under an Emergency Use Authorization (EUA). This EUA will remain  in effect (meaning this test can  be used) for the duration of the COVID-19 declaration under Section 564(b)(1) of the Act, 21 U.S.C. section 360bbb-3(b)(1), unless the authorization is terminated or revoked sooner.    Influenza A by PCR NEGATIVE NEGATIVE Final   Influenza B by PCR NEGATIVE NEGATIVE Final    Comment: (NOTE) The Xpert Xpress SARS-CoV-2/FLU/RSV assay is intended as an aid in  the diagnosis of influenza from Nasopharyngeal swab specimens and  should not be used as a sole basis for treatment. Nasal washings and  aspirates are unacceptable for Xpert Xpress SARS-CoV-2/FLU/RSV  testing. Fact Sheet for Patients: PinkCheek.be Fact Sheet for Healthcare Providers: GravelBags.it This test is not yet approved or cleared by the [place] FDA and  has been authorized for detection and/or diagnosis of SARS-CoV-2 by  FDA under an Emergency Use Authorization (EUA). This EUA will remain  in effect (meaning this test can be used) for the duration of the  Covid-19 declaration under Section 564(b)(1) of the Act, 21  U.S.C. section 360bbb-3(b)(1), unless the authorization is  terminated or revoked. Performed at [facility], [place] [address], [place], [place] [address]   Culture, blood (routine x 2)     Status: None (Preliminary result)   Collection Time: 05/14/19  2:28 PM   Specimen: BLOOD RIGHT HAND  Result Value Ref Range Status   Specimen Description BLOOD RIGHT HAND  Final   Special Requests   Final    BOTTLES DRAWN AEROBIC ONLY Blood Culture adequate volume   Culture   Final    NO GROWTH 4 DAYS Performed at [facility], [place] [address], [place], [place] [address]    Report Status PENDING  Incomplete  Culture, blood (routine x 2)     Status: None (Preliminary result)   Collection Time: 05/14/19  2:34 PM   Specimen: BLOOD LEFT HAND  Result Value Ref Range Status   Specimen Description BLOOD LEFT HAND  Final   Special Requests   Final     BOTTLES DRAWN AEROBIC ONLY Blood Culture adequate volume  Culture   Final    NO GROWTH 4 DAYS Performed at [facility], [place] [address] [place], [place] [address]    Report Status PENDING  Incomplete    Radiology Studies: No results found.  Wynter Isaacs T. Parker  If 7PM-7AM, please contact night-coverage [email or web address] Password [facility] 05/21/2019, 11:32 AM

## 2019-05-22 DIAGNOSIS — N171 Acute kidney failure with acute cortical necrosis: Secondary | ICD-10-CM

## 2019-05-22 LAB — RENAL FUNCTION PANEL
Albumin: 2.5 g/dL — ABNORMAL LOW (ref 3.5–5.0)
Anion gap: 13 (ref 5–15)
BUN: 51 mg/dL — ABNORMAL HIGH (ref 8–23)
CO2: 32 mmol/L (ref 22–32)
Calcium: 8.8 mg/dL — ABNORMAL LOW (ref 8.9–10.3)
Chloride: 93 mmol/L — ABNORMAL LOW (ref 98–111)
Creatinine, Ser: 1.9 mg/dL — ABNORMAL HIGH (ref 0.61–1.24)
GFR calc Af Amer: 40 mL/min — ABNORMAL LOW (ref >60)
GFR calc non Af Amer: 35 mL/min — ABNORMAL LOW (ref >60)
Glucose, Bld: 112 mg/dL — ABNORMAL HIGH (ref 70–99)
Phosphorus: 2.9 mg/dL (ref 2.5–4.6)
Potassium: 3.2 mmol/L — ABNORMAL LOW (ref 3.5–5.1)
Sodium: 138 mmol/L (ref 135–145)

## 2019-05-22 LAB — PROTIME-INR
INR: 2.2 — ABNORMAL HIGH (ref 0.8–1.2)
Prothrombin Time: 24.4 seconds — ABNORMAL HIGH (ref 11.4–15.2)

## 2019-05-22 LAB — MAGNESIUM: Magnesium: 2.2 mg/dL (ref 1.7–2.4)

## 2019-05-22 LAB — COOXEMETRY PANEL
Carboxyhemoglobin: 2.6 % — ABNORMAL HIGH (ref 0.5–1.5)
Methemoglobin: 0.3 % (ref 0.0–1.5)
O2 Saturation: 89.4 %
Total hemoglobin: 7.4 g/dL — ABNORMAL LOW (ref 12.0–16.0)

## 2019-05-22 LAB — POTASSIUM: Potassium: 3.2 mmol/L — ABNORMAL LOW (ref 3.5–5.1)

## 2019-05-22 LAB — HEPARIN LEVEL (UNFRACTIONATED): Heparin Unfractionated: 0.47 IU/mL (ref 0.30–0.70)

## 2019-05-22 MED ORDER — POTASSIUM CHLORIDE 10 MEQ/100ML IV SOLN
10.0000 meq | INTRAVENOUS | Status: AC
  Administered 2019-05-22 (×4): 10 meq via INTRAVENOUS
  Filled 2019-05-22 (×4): qty 100

## 2019-05-22 MED ORDER — POTASSIUM CHLORIDE CRYS ER 20 MEQ PO TBCR: 40.0000 meq | EXTENDED_RELEASE_TABLET | Freq: Once | ORAL | Status: DC

## 2019-05-22 MED ORDER — POTASSIUM CHLORIDE CRYS ER 10 MEQ PO TBCR
40.0000 meq | EXTENDED_RELEASE_TABLET | Freq: Once | ORAL | Status: AC
  Administered 2019-05-22: 40 meq via ORAL
  Filled 2019-05-22: qty 4

## 2019-05-22 MED ORDER — LIDOCAINE 5 % EX PTCH
1.0000 | MEDICATED_PATCH | CUTANEOUS | Status: DC
  Administered 2019-05-22 – 2019-05-26 (×5): 1 via TRANSDERMAL
  Filled 2019-05-22 (×5): qty 1

## 2019-05-22 MED ORDER — METOLAZONE 5 MG PO TABS
5.0000 mg | ORAL_TABLET | Freq: Once | ORAL | Status: AC
  Administered 2019-05-22: 21:00:00 5 mg via ORAL
  Filled 2019-05-22: qty 1

## 2019-05-22 MED ORDER — SPIRONOLACTONE 25 MG PO TABS
100.0000 mg | ORAL_TABLET | Freq: Every day | ORAL | Status: DC
  Administered 2019-05-22 – 2019-05-24 (×3): 100 mg via ORAL
  Filled 2019-05-22 (×3): qty 4

## 2019-05-22 MED ORDER — POTASSIUM CHLORIDE 20 MEQ PO PACK
40.0000 meq | PACK | Freq: Once | ORAL | Status: DC
  Filled 2019-05-22: qty 2

## 2019-05-22 NOTE — Plan of Care (Signed)

## 2019-05-22 NOTE — Progress Notes (Addendum)
Advanced Heart Failure Rounding Note   Subjective:     On milrinone 0.125 . Co-ox 89%   On lasix gtt at 30 gtt + diamox + spiro. 2L in UOP yesterday. Wt unchanged, still at 330 lb.  Creatinine trending down, 2.0>>1.95>>1.90   More alert today. Sitting up on side of bed eating ice cream. Wife present at bedside.    Objective:   Weight Range:  Vital Signs:   Temp:  [98.2 F (36.8 C)-98.3 F (36.8 C)] 98.3 F (36.8 C) (02/11 0639) Pulse Rate:  [80] 80 (02/11 0639) Resp:  [17-18] 18 (02/11 0800) BP: (105-123)/(52-66) 123/66 (02/11 0639) SpO2:  [93 %-98 %] 98 % (02/11 0800) Weight:  [149.9 kg] 149.9 kg (02/11 0630) Last BM Date: 05/22/19  Weight change: Filed Weights   05/21/19 0615 05/21/19 0851 05/22/19 0630  Weight: 118.9 kg (!) 149.7 kg (!) 149.9 kg    Intake/Output:   Intake/Output Summary (Last 24 hours) at 05/22/2019 1250 Last data filed at 05/22/2019 1000 Gross per 24 hour  Intake 2602.77 ml  Output 3600 ml  Net -997.23 ml     Physical Exam: General: obese and chronically ill WM, + resting tremor, No respiratory difficulty, sitting up on side of bed HEENT: normal, anicteric  Neck: supple. elevated JVD. Carotids 2+ bilat; no bruits. No lymphadenopathy or thyromegaly appreciated. Cor: PMI nondisplaced. Regular rate & rhythm. + mechanical valve sounds, 2/6 AS murmur, 2/6 TR murmur Lungs: bibasilar crackles Abdomen: obese, soft, nontender, nondistended. No hepatosplenomegaly. No bruits or masses. Good bowel sounds. Extremities: no cyanosis, clubbing, rash, edematous upper extremities, 2+ bilateral LEE + unna boots  Neuro: alert & oriented x 3, cranial nerves grossly intact. moves all 4 extremities w/o difficulty. Affect pleasant.   Telemetry: AV paced 60-80s,   Multiple runs NSVT up to 11 beats Personally reviewed   Labs: Basic Metabolic Panel: Recent Labs  Lab 05/18/19 0455 05/18/19 0455 05/19/19 0500 05/19/19 0500 05/20/19 0343 05/20/19 0343  05/20/19 1350 05/20/19 1350 05/21/19 0500 05/21/19 1720 05/22/19 0428  NA 137   < > 139   < > 139  --  138  --  138 138 138  K 3.2*   < > 2.9*   < > 2.3*  --  2.6*  --  2.6* 3.2* 3.2*  CL 94*   < > 91*   < > 95*  --  95*  --  94* 93* 93*  CO2 29   < > 32   < > 31  --  30  --  32 32 32  GLUCOSE 108*   < > 109*   < > 108*  --  120*  --  104* 119* 112*  BUN 81*   < > 69*   < > 62*  --  58*  --  54* 51* 51*  CREATININE 2.06*   < > 1.91*   < > 1.78*  --  1.86*  --  2.00* 1.95* 1.90*  CALCIUM 8.3*   < > 8.7*   < > 8.6*   < > 8.3*   < > 8.6* 8.7* 8.8*  MG 2.6*  --  2.4  --  2.2  --   --   --  2.1  --  2.2  PHOS 4.0  --  4.1  --  4.1  --   --   --  3.7  --  2.9   < > = values in this interval not displayed.  Liver Function Tests: Recent Labs  Lab 05/19/19 0500 05/20/19 0343 05/21/19 0500 05/21/19 1720 05/22/19 0428  AST  --   --   --  46*  --   ALT  --   --   --  16  --   ALKPHOS  --   --   --  105  --   BILITOT  --   --   --  1.2  --   PROT  --   --   --  7.0  --   ALBUMIN 2.6* 2.5* 2.4* 2.4* 2.5*   No results for input(s): LIPASE, AMYLASE in the last 168 hours. Recent Labs  Lab 05/18/19 1641 05/20/19 0343 05/21/19 0500  AMMONIA 110* 96* 92*    CBC: Recent Labs  Lab 05/17/19 0614 05/17/19 1644 05/18/19 0455 05/18/19 1641 05/19/19 0500 05/20/19 0343 05/21/19 0500  WBC 7.0  --  8.6  --  7.9 7.0 6.4  HGB 6.5*   < > 7.0* 7.5* 7.3* 7.2* 7.4*  HCT 21.9*   < > 22.7* 25.0* 23.5* 23.4* 24.7*  MCV 97.3  --  95.0  --  96.3 97.1 100.4*  PLT 233  --  257  --  260 257 265   < > = values in this interval not displayed.    Cardiac Enzymes: No results for input(s): CKTOTAL, CKMB, CKMBINDEX, TROPONINI in the last 168 hours.  BNP: BNP (last 3 results) Recent Labs    03/16/19 2359 05/13/19 1930  BNP 105.7* 163.6*    ProBNP (last 3 results) No results for input(s): PROBNP in the last 8760 hours.    Other results:  Imaging: No results found.   Medications:      Scheduled Medications: . sodium chloride   Intravenous Once  . sodium chloride   Intravenous Once  . acetaminophen  1,000 mg Oral Q8H  . acetaZOLAMIDE  250 mg Oral BID  . ferrous sulfate  325 mg Oral Q breakfast  . lactulose  20 g Oral TID  . lidocaine  1 patch Transdermal Q24H  . mouth rinse  15 mL Mouth Rinse BID  . pantoprazole  40 mg Oral BID  . spironolactone  100 mg Oral Daily    Infusions: . furosemide (LASIX) infusion 30 mg/hr (05/22/19 1153)  . heparin 1,300 Units/hr (05/22/19 0836)  . milrinone 0.125 mcg/kg/min (05/22/19 0953)  . potassium chloride 10 mEq (05/22/19 1151)    PRN Medications: lip balm, oxyCODONE, polyethylene glycol, sodium chloride flush   Assessment/Plan:   1. 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  - Remains markedly volume overloaded with severe R-sided HF in the setting of wide open TR (which I suspect is due to torn TV) - Was not responding well to lasix gtt at 20 with diamox. Milrinone added 2/5 - Co-ox 89% on milrinone 0.125.  - Renal function improved with milrinone. Went up after addition of spiro but trending back down. - 2L in UOP yesterday - Continue lasix gtt at 30. Continue diamox (CO2 normalized). Supp K aggressively - ? Adding back metolazone   2. 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  - not candidate for surgery - no change  3. AKI on CKD  -Baseline creatinine < 2.0  - Was up to 2.8. Milrinone aded 2/5. Cr trend 2.3 -> 2.06 -> 1.9->1.78-. 2.0->1.9 - suspect cardiorenal.  - continue milrinone  4. Anemia  - GI consulted.  - Had colonoscopy  in 02/2019 with ext/int hemmorhoids.  - LDH mildly elevated at 300 -> 238. Haptoglobin <10. Suspect component of valvular hemolysis but do not feel that this is high-grade process to explain all of his anemia.  - Primary team managing.  - INR 2.2. Continue heparin given mech MVR. Discussed dosing with PharmD personally. - Hgb  7.4 yesterday. Repeat CBC in am. Transfuse of <7.0   5. Mechanical MVR with moderate MS - INR down to 2.2   - Coumadin has been on hold. Now on heparin  - See discussion abve  6. Moderate AS - medical therapy   7. Obesity  -Body mass index is 47.84 kg/m.  8. Hypokalemia - K 3.2 today  - give Kdur 40 mEq x 1 - continue spiro - follow BMP  9. Lethargy due to hepatic encephalopathy - elevated Ammonia levels this admit. 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 NSVT/SVT - supp K per above  11. GOC - Continues to deteriorata - Short-term prognosis is quite poor.  - Appreciate Palliative input. Believe strongly he needs Hospice but they are not there yet  - Palliative care team to continue discussions  Length of Stay: [place] PA-C 05/22/2019, 12:50 PM  Advanced Heart Failure Team Pager [phone] (M-F; 7a - 4p)  Please contact [facility] for night-coverage after hours (4p -7a ) and weekends on [email or web address]  Patient seen and examined with the above-signed Advanced Practice Provider and/or Housestaff. I personally reviewed laboratory data, imaging studies and relevant notes. I independently examined the patient and formulated the important aspects of the plan. I have edited the note to reflect any of my changes or salient points. I have personally discussed the plan with the patient and/or family.  Remains tenuous. Co-ox improved on milrinone. Diuresing on IV lasix but unfortunately weight not changed. UNNA boots replaced. Creatinine improved. K 3.2. On heparin for MVR.  He continues to be quite weak and lethargic. Remains on lactulose.   Despite aggressive care he really is not appreciably any better. I do not see any rel way that we will be able to care for him effectively outside of the hospital without hospice support.   Will give a dose of metolazone 5 today to see if diuresis  increases  Glori Bickers, MD  5:25 PM

## 2019-05-22 NOTE — Progress Notes (Signed)
Met Mr and Mrs Macmaster after consult from [facility] this morning.  Mr. Bennison appeared to be in pain and gave indications of wanting to sleep.  Initiated caring relationship with Mrs. Coad, affirming and validating the choices she making at this time for her husband's future.  Identified her spiritual strengths and applauded her courage.  Escorted Mrs. Geissinger to [employer] for lunch and back to the room.  Mrs. Stmartin expressed grief over their situation.  Had brief prayer with her.  Plan to follow up again as long as Mr. Goostree is here.

## 2019-05-22 NOTE — Progress Notes (Signed)
PROGRESS NOTE  Frank [name] DXA:[id number] DOB: July 14, 1945   PCP: [name], MD  Patient is from: Home.  Lives with his wife.  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 Fib, lymphedema, CKD-4, debility, morbid obesity, recurrent hospitalization for sepsis secondary to Streptococcus from RLE cellulitis presenting with progressive dyspnea and weight gain.   In ED, hemodynamically stable except for brief tachycardia to 124.  Saturation 98% on room air. CXR consistent with CHF.  K7.0. Cr 2.67 (baseline~1.8).  INR 7.2.  Hgb 7.2 (baseline about 8.0).  EKG revealed paced rhythm.  COVID-19 negative.  Received IV Lasix 120 mg once and was started on Lasix drip by cardiologist.  Also received bicarb, IV insulin and D50, and admitted for CHF exacerbation, AKI, hyperkalemia, supratherapeutic INR and anemia.  Cardiology consulted and started on Lasix drip.  Hypokalemia resolved with Lokelma and IV Lasix.  INR improved after vitamin K.  Hemoglobin dropped requiring intermittent blood transfusions.  FOBT positive.  GI consulted and not planning EGD given his cardiac comorbidity unless signs of profuse bleeding.  Anticoagulation resumed with heparin drip.  Palliative care consulted and met with patient and wife on 2/9.  CODE STATUS changed to DNR/DNI. Plan to readdress [place] 3 days later if no improvement.  Subjective: No major events overnight or this morning.  Has not had a good night due to left chest wall pain.  Looks tired and sleepy this morning.  No other complaints.  Wife at bedside.  Objective: Vitals:   05/21/19 0851 05/21/19 1427 05/22/19 0630 05/22/19 0639  BP:  (!) 105/52  123/66  Pulse:    80  Resp:    17  Temp:  98.2 F (36.8 C)  98.3 F (36.8 C)  TempSrc:  Oral  Oral  SpO2:    93%  Weight: (!) 149.7 kg  (!) 149.9 kg   Height:        Intake/Output Summary (Last 24 hours) at  05/22/2019 1214 Last data filed at 05/22/2019 1000 Gross per 24 hour  Intake 2602.77 ml  Output 3600 ml  Net -997.23 ml   Filed Weights   05/21/19 0615 05/21/19 0851 05/22/19 0630  Weight: 118.9 kg (!) 149.7 kg (!) 149.9 kg    Examination:  GENERAL: Chronically ill-appearing and fatigued. HEENT: MMM.  Vision and hearing grossly intact.  NECK: Supple.  Notable JVD. RESP:  No IWOB. Good air movement bilaterally. CVS:  RRR.  2/6 SEM over RUSB and LUSB. ABD/GI/GU: Bowel sounds present. Soft. Non tender.  MSK/EXT: 2+ pitting edema bilaterally.  Ace wrap over both legs. SKIN: Ace wrap over both legs. NEURO: Drowsy and lethargic.  Fairly oriented..  No apparent focal neuro deficit. PSYCH: Appears lethargic  Procedures:  None  Assessment & Plan: Acute on chronic diastolic CHF: Echo in 20/94 with EF of 55 to 60%, moderate LVH, moderate LAE and severe RAE, moderate to severe TVR.  Had significant fluid overload with massive lymphedema.  Presented with dyspnea and weight gain.  CXR concerning for CHF.  2.1 L UOP/24 hours.  Creatinine is stable. -Advanced HF team managing- IV Lasix drip, diamox and milrinone.  -Increased Aldactone to 100 mg daily mainly for hypokalemia. -Monitor fluid status, renal function and electrolytes -Sodium and fluid restrictions.  Acute metabolic encephalopathy likely due to hepatic encephalopathy: Ammonia 110> 98> 92.  No documented history of cirrhosis but at risk due to morbid obesity.  ABG reassuring.  Somewhat drowsy and lethargic this morning. -Continue lactulose to 20 g 3 times daily-had 3 BMs/24 hours charted. -Minimal oxygen to maintain saturation above 90%.  AKI on CKD-4/azotemia: Baseline  Cr 1.8-2.0 > 2.67 (admit) >> 2.84>>1.91> 1.78> 1.9.  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 -Continue monitoring.  Acute on chronic blood loss anemia in in the setting of supratherapeutic INR: Hgb ~8.0 (baseline)> 7.2 (admit)>> 6.5>>4u>7.0>> 7.2>7.4.   INR 7.6>>2.2. FOBT  positive.  Elevated LDH and low haptoglobin concerning for some element of valvular hemolysis.  H&H low but seems to be stable now. 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  Not a candidate for surgery. Supratherapeutic INR: INR 7.2> 7.6> vitamin K>> 4.8> 2.2 -Continue heparin.  Warfarin on hold.  Paroxysmal atrial fibrillation: NSR.  Not on rate or rhythm control. -Anticoagulation as above  Hyponatremia: Likely due to renal failure.  Resolved.  Hypokalemia: K3.2..  Likely due to diuretics and lactulose.  Magnesium within normal. -IV KCl 40 mEq -Increase Aldactone to 100 mg daily. -Continue monitoring  At risk for sleep apnea/OHS: Body mass index is 46.1 kg/m.  Refuses nightly CPAP.  Recent ABG without significant hypercarbia. -Discontinued   Recent hospitalization for group G Streptococcus bacteremia from RLE cellulitis in patient with lymphedema -Completed antibiotic course with penicillin G followed by oral Zyvox for a total of 14 days through 12/22. -Evaluated by ID, Dr. Tommy Medal on 1/11 and prescribed Augmentin for 2 more weeks with refill. -Blood cultures negative x2 this admission. -Received amoxicillin 2/3-2/9.  Monitor off antibiotics.   Left chest wall pain: noted small bruise laterally.  Tender to palpation. -Continue scheduled Tylenol with as needed oxycodone.  -Lidoderm to left chest wall pain.  Generalized weakness/debility -PT/OT  Lymphedema/possible right LE cellulitis: Improving. -Diuretics as above. -Appreciate WOC  Morbid obesity: Body mass index is 46.1 kg/m. -Encourage lifestyle change to lose weight  Goal of care/DNR/DNI: Patient is full code.  Significant comorbidities as above poor prognosis.   Entertain palliative care consult, and was interested.  Palliative care consulted and met with patient and family.   -2/9-DNR/DNI and readdress [place] 3 days later if no improvement.         DVT prophylaxis: On heparin drip Code Status: DNR/DNI Family Communication: Updated patient's wife at bedside.  Discharge barrier: Complex multiple medical issues as above Patient is from: Home Final disposition: To be determined based on clinical progress  Consultants: Cardiology   Microbiology summarized: [id number] and influenza PCR negative. Blood cultures negative so far  Sch Meds:  Scheduled Meds: . sodium chloride   Intravenous Once  . sodium chloride   Intravenous Once  . acetaminophen  1,000 mg Oral Q8H  . acetaZOLAMIDE  250 mg Oral BID  . ferrous sulfate  325 mg Oral Q breakfast  . lactulose  20 g Oral TID  . mouth rinse  15 mL Mouth Rinse BID  . pantoprazole  40 mg Oral BID  . spironolactone  100 mg Oral Daily   Continuous Infusions: . furosemide (LASIX) infusion 30 mg/hr (05/22/19 1153)  . heparin 1,300 Units/hr (05/22/19 0836)  . milrinone 0.125 mcg/kg/min (05/22/19 0953)  . potassium chloride 10 mEq (05/22/19 1151)   PRN Meds:.lip balm, oxyCODONE, polyethylene glycol, sodium chloride flush  Antimicrobials:  Anti-infectives (From admission, onward)   Start     Dose/Rate Route Frequency Ordered Stop   05/14/19 1700  amoxicillin (AMOXIL) capsule 500 mg  Status:  Discontinued     500 mg Oral Every 8 hours 05/14/19 1620 05/20/19 1359       I have personally reviewed the following labs and images: CBC: Recent Labs  Lab 05/17/19 0614 05/17/19 1644 05/18/19 0455 05/18/19 1641 05/19/19 0500 05/20/19 0343 05/21/19 0500  WBC 7.0  --  8.6  --  7.9 7.0 6.4  HGB 6.5*   < > 7.0* 7.5* 7.3* 7.2* 7.4*  HCT 21.9*   < > 22.7* 25.0* 23.5* 23.4* 24.7*  MCV 97.3  --  95.0  --  96.3 97.1 100.4*  PLT 233  --  257  --  260 257 265   < > = values in this interval not displayed.    BMP &GFR Recent Labs  Lab 05/18/19 0455 05/18/19 0455 05/19/19 0500 05/19/19 0500 05/20/19 0343 05/20/19 1350 05/21/19 0500 05/21/19 1720 05/22/19 0428  NA 137   < > 139   < > 139 138 138 138 138  K 3.2*   < > 2.9*   < > 2.3* 2.6* 2.6* 3.2* 3.2*  CL 94*   < > 91*   < > 95* 95* 94* 93* 93*  CO2 29   < > 32   < > 31 30 32 32 32  GLUCOSE 108*   < > 109*   < > 108* 120* 104* 119* 112*  BUN 81*   < > 69*   < > 62* 58* 54* 51* 51*  CREATININE 2.06*   < > 1.91*   < > 1.78* 1.86* 2.00* 1.95* 1.90*  CALCIUM 8.3*   < > 8.7*   < > 8.6* 8.3* 8.6* 8.7* 8.8*  MG 2.6*  --  2.4  --  2.2  --  2.1  --  2.2  PHOS 4.0  --  4.1  --  4.1  --  3.7  --  2.9   < > = values in this interval not displayed.   Estimated Creatinine Clearance: 53 mL/min (A) (by C-G formula based on SCr of 1.9 mg/dL (H)). Liver & Pancreas: Recent Labs  Lab 05/19/19 0500 05/20/19 0343 05/21/19 0500 05/21/19 1720 05/22/19 0428  AST  --   --   --  46*  --   ALT  --   --   --  16  --   ALKPHOS  --   --   --  105  --   BILITOT  --   --   --  1.2  --   PROT  --   --   --  7.0  --   ALBUMIN 2.6* 2.5* 2.4* 2.4* 2.5*   No results for input(s): LIPASE, AMYLASE in the last 168 hours. Recent Labs  Lab 05/18/19 1641 05/20/19 0343 05/21/19 0500  AMMONIA 110* 96* 92*   Diabetic: No results for input(s): HGBA1C in the last 72 hours. No results for input(s): GLUCAP in the last 168 hours. Cardiac Enzymes: No results for input(s): CKTOTAL, CKMB, CKMBINDEX, TROPONINI in the last 168 hours. No results for input(s): PROBNP in the last 8760 hours. Coagulation Profile: Recent Labs  Lab 05/18/19 0455 05/19/19 0500 05/20/19 0343 05/21/19 0500 05/22/19 0428  INR 2.3* 2.3* 2.2* 2.2* 2.2*   Thyroid Function Tests: No results for input(s): TSH, T4TOTAL, FREET4, T3FREE, THYROIDAB in the last 72 hours. Lipid  Profile: No results for input(s): CHOL, HDL, LDLCALC, TRIG, CHOLHDL, LDLDIRECT in the last 72 hours. Anemia Panel: No  results for input(s): VITAMINB12, FOLATE, FERRITIN, TIBC, IRON, RETICCTPCT in the last 72 hours. Urine analysis:    Component Value Date/Time   COLORURINE YELLOW 03/17/2019 0054   APPEARANCEUR CLOUDY (A) 03/17/2019 0054   LABSPEC 1.006 03/17/2019 0054   PHURINE 6.0 03/17/2019 0054   GLUCOSEU NEGATIVE 03/17/2019 0054   HGBUR SMALL (A) 03/17/2019 0054   BILIRUBINUR NEGATIVE 03/17/2019 0054   KETONESUR NEGATIVE 03/17/2019 0054   PROTEINUR 30 (A) 03/17/2019 0054   NITRITE NEGATIVE 03/17/2019 0054   LEUKOCYTESUR LARGE (A) 03/17/2019 0054   Sepsis Labs: Invalid input(s): PROCALCITONIN, [place]  Microbiology: Recent Results (from the past 240 hour(s))  Respiratory Panel by RT PCR (Flu A&B, Covid) - Nasopharyngeal Swab     Status: None   Collection Time: 05/13/19  8:19 PM   Specimen: Nasopharyngeal Swab  Result Value Ref Range Status   SARS Coronavirus 2 by RT PCR NEGATIVE NEGATIVE Final    Comment: (NOTE) SARS-CoV-2 target nucleic acids are NOT DETECTED. The SARS-CoV-2 RNA is generally detectable in upper respiratoy specimens during the acute phase of infection. The lowest concentration of SARS-CoV-2 viral copies this assay can detect is 131 copies/mL. A negative result does not preclude SARS-Cov-2 infection and should not be used as the sole basis for treatment or other patient management decisions. A negative result may occur with  improper specimen collection/handling, submission of specimen other than nasopharyngeal swab, presence of viral mutation(s) within the areas targeted by this assay, and inadequate number of viral copies (<131 copies/mL). A negative result must be combined with clinical observations, patient history, and epidemiological information. The expected result is Negative. Fact Sheet for Patients:  PinkCheek.be Fact Sheet for Healthcare Providers:  GravelBags.it This test is not yet ap proved or  cleared by the [place] FDA and  has been authorized for detection and/or diagnosis of SARS-CoV-2 by FDA under an Emergency Use Authorization (EUA). This EUA will remain  in effect (meaning this test can be used) for the duration of the COVID-19 declaration under Section 564(b)(1) of the Act, 21 U.S.C. section 360bbb-3(b)(1), unless the authorization is terminated or revoked sooner.    Influenza A by PCR NEGATIVE NEGATIVE Final   Influenza B by PCR NEGATIVE NEGATIVE Final    Comment: (NOTE) The Xpert Xpress SARS-CoV-2/FLU/RSV assay is intended as an aid in  the diagnosis of influenza from Nasopharyngeal swab specimens and  should not be used as a sole basis for treatment. Nasal washings and  aspirates are unacceptable for Xpert Xpress SARS-CoV-2/FLU/RSV  testing. Fact Sheet for Patients: PinkCheek.be Fact Sheet for Healthcare Providers: GravelBags.it This test is not yet approved or cleared by the [place] FDA and  has been authorized for detection and/or diagnosis of SARS-CoV-2 by  FDA under an Emergency Use Authorization (EUA). This EUA will remain  in effect (meaning this test can be used) for the duration of the  Covid-19 declaration under Section 564(b)(1) of the Act, 21  U.S.C. section 360bbb-3(b)(1), unless the authorization is  terminated or revoked. Performed at [facility], [place] [address], [place], [place] [address]   Culture, blood (routine x 2)     Status: None (Preliminary result)   Collection Time: 05/14/19  2:28 PM   Specimen: BLOOD RIGHT HAND  Result Value Ref Range Status   Specimen Description BLOOD RIGHT HAND  Final   Special Requests   Final  BOTTLES DRAWN AEROBIC ONLY Blood Culture adequate volume   Culture   Final    NO GROWTH 4 DAYS Performed at [facility], [place] [address] [place], [place] [address]    Report Status PENDING  Incomplete  Culture, blood (routine x 2)      Status: None (Preliminary result)   Collection Time: 05/14/19  2:34 PM   Specimen: BLOOD LEFT HAND  Result Value Ref Range Status   Specimen Description BLOOD LEFT HAND  Final   Special Requests   Final    BOTTLES DRAWN AEROBIC ONLY Blood Culture adequate volume   Culture   Final    NO GROWTH 4 DAYS Performed at [facility], [place] [address], [place], [place] [address]    Report Status PENDING  Incomplete    Radiology Studies: No results found.  Ana Liaw T. [place]  If 7PM-7AM, please contact night-coverage [email or web address] Password [facility] 05/22/2019, 12:14 PM

## 2019-05-22 NOTE — Progress Notes (Signed)
Physical Therapy Treatment Patient Details Name: Frank [name] MRN: [id number] DOB: 01/23/1946 Today's Date: 05/22/2019    History of Present Illness Pt is a 74 y/o male with PMH of diverticulosis, hemorrhoids, a fib, complete heart block s/p PPM, hypertropic obstructive cardiomyopathy s/p myectomy with mechanical mitral valve replacement and aortic valve repair, CHF, HTN, TIA, LB lymphedema presenting to ED, SOB and back pain.  Pt with cellulitis of R LE, CHF, and questionable infection.    PT Comments    Patient received sitting at EOB, falling asleep sitting and asking to be put back to bed due to condom cath needing to be replaced. Assisted spouse in putting him back, required MaxAx2 this afternoon due to fatigue and pain. He declined any other activity with PT today, and was left in bed with spouse present and all needs otherwise met. Will need to discuss potential for staying on PT caseload due to slow progress and poor prognosis at this time.     Follow Up Recommendations  Other (comment);Supervision/Assistance - 24 hour(potentially hospice with HHPT if hospice allows)     Equipment Recommendations  Rolling walker with 5" wheels;3in1 (PT);Wheelchair (measurements PT);Wheelchair cushion (measurements PT);Hospital bed    Recommendations for Other Services       Precautions / Restrictions Precautions Precautions: Fall Restrictions Weight Bearing Restrictions: No    Mobility  Bed Mobility Overal bed mobility: Needs Assistance Bed Mobility: Sit to Supine       Sit to supine: Max assist;+2 for physical assistance   General bed mobility comments: heavy assist due to fatigue and back pain  Transfers                 General transfer comment: defer- fatigue/lethargy  Ambulation/Gait             General Gait Details: deferred- fatigue/lethargy   Stairs             Wheelchair Mobility    Modified Rankin (Stroke Patients Only)       Balance  Overall balance assessment: Needs assistance Sitting-balance support: Feet supported;Bilateral upper extremity supported Sitting balance-Leahy Scale: Good     Standing balance support: Bilateral upper extremity supported;During functional activity Standing balance-Leahy Scale: Poor Standing balance comment: Able to stand, however required 2 handheld assist                            Cognition Arousal/Alertness: Lethargic Behavior During Therapy: Flat affect Overall Cognitive Status: Impaired/Different from baseline Area of Impairment: Attention;Memory;Following commands;Safety/judgement;Awareness;Problem solving                   Current Attention Level: Sustained Memory: Decreased short-term memory Following Commands: Follows one step commands inconsistently Safety/Judgement: Decreased awareness of safety;Decreased awareness of deficits Awareness: Intellectual Problem Solving: Slow processing;Decreased initiation General Comments: fatigued at EOB and drifting in and out of sleep, asking to be put back to bed      Exercises      General Comments        Pertinent Vitals/Pain Pain Assessment: Faces Faces Pain Scale: Hurts even more Pain Location: back Pain Descriptors / Indicators: Discomfort;Aching;Grimacing;Guarding;Moaning;Shooting Pain Intervention(s): Limited activity within patient's tolerance;Monitored during session    Home Living                      Prior Function            PT Goals (current goals can now be  found in the care plan section) Acute Rehab PT Goals Patient Stated Goal: to feel stronger and to sleep PT Goal Formulation: With patient Time For Goal Achievement: 05/29/19 Potential to Achieve Goals: Good Progress towards PT goals: Not progressing toward goals - comment(limited by fatigue/pain/lethargy)    Frequency    Min 3X/week      PT Plan Current plan remains appropriate    Co-evaluation               AM-PAC PT "6 Clicks" Mobility   Outcome Measure  Help needed turning from your back to your side while in a flat bed without using bedrails?: A Lot Help needed moving from lying on your back to sitting on the side of a flat bed without using bedrails?: A Lot Help needed moving to and from a bed to a chair (including a wheelchair)?: A Lot Help needed standing up from a chair using your arms (e.g., wheelchair or bedside chair)?: A Lot Help needed to walk in hospital room?: A Lot Help needed climbing 3-5 steps with a railing? : Total 6 Click Score: 11    End of Session   Activity Tolerance: Patient limited by fatigue;Patient limited by lethargy Patient left: in bed;with call bell/phone within reach;with family/visitor present Nurse Communication: Other (comment)(needs condom cath replaced) PT Visit Diagnosis: Muscle weakness (generalized) (M62.81);Difficulty in walking, not elsewhere classified (R26.2)     Time: 1456-1510 PT Time Calculation (min) (ACUTE ONLY): 14 min  Charges:  $Therapeutic Activity: 8-22 mins                     Windell Norfolk, DPT, PN1   Supplemental Physical Therapist [place]    Pager [phone] Acute Rehab Office [phone]

## 2019-05-22 NOTE — Progress Notes (Signed)
[facility] for Coumadin / heparin  Indication: Atrial fibrillation and mechanical heart valve  Allergies  Allergen Reactions  . Warfarin And Related Other (See Comments)    COUMADIN or JANTOVEN BRAND ONLY** Per pt, was switched to generic in 2001 and had significantly decreased absorption > INR subtherapeutic> pt suffered a TIA and has been on brand name only since     Patient Measurements: Height: 5\' 11"  (180.3 cm) Weight: (!) 330 lb 8 oz (149.9 kg) IBW/kg (Calculated) : 75.3  Vital Signs: Temp: 98.3 F (36.8 C) (02/11 0639) Temp Source: Oral (02/11 0639) BP: 123/66 (02/11 0639) Pulse Rate: 80 (02/11 0639)  Labs: Recent Labs    05/20/19 0343 05/20/19 1350 05/21/19 0500 05/21/19 0952 05/21/19 1720 05/22/19 0428  HGB 7.2*  --  7.4*  --   --   --   HCT 23.4*  --  24.7*  --   --   --   PLT 257  --  265  --   --   --   LABPROT 24.0*  --  24.1*  --   --  24.4*  INR 2.2*  --  2.2*  --   --  2.2*  HEPARINUNFRC 0.63  --  0.93* 0.43  --  0.47  CREATININE 1.78*   < > 2.00*  --  1.95* 1.90*   < > = values in this interval not displayed.    Estimated Creatinine Clearance: 53 mL/min (A) (by C-G formula based on SCr of 1.9 mg/dL (H)).    Assessment: 58 yoM on warfarin PTA for AF and mMVR admitted with CHF. Warfarin held for possible procedures and vitamin K given earlier in admit. Heparin remains therapeutic, INR 2.2, no CBC today.  Goal of Therapy:  Heparin level: 0.3-0.5 INR 2.5-3.5 Monitor platelets by anticoagulation protocol: Yes   Plan:  -Heparin 1300 units/h -Holding warfarin - will need to decide about restarting soon if no procedures planned -Daily INR, CBC, heparin level   Arrie Senate, PharmD, BCPS Clinical Pharmacist [phone] Please check AMION for all [place] numbers 05/22/2019

## 2019-05-23 DIAGNOSIS — Z7189 Other specified counseling: Secondary | ICD-10-CM

## 2019-05-23 DIAGNOSIS — Z66 Do not resuscitate: Secondary | ICD-10-CM

## 2019-05-23 LAB — CBC
HCT: 25.7 % — ABNORMAL LOW (ref 39.0–52.0)
Hemoglobin: 7.5 g/dL — ABNORMAL LOW (ref 13.0–17.0)
MCH: 30 pg (ref 26.0–34.0)
MCHC: 29.2 g/dL — ABNORMAL LOW (ref 30.0–36.0)
MCV: 102.8 fL — ABNORMAL HIGH (ref 80.0–100.0)
Platelets: 261 10*3/uL (ref 150–400)
RBC: 2.5 MIL/uL — ABNORMAL LOW (ref 4.22–5.81)
RDW: 21.2 % — ABNORMAL HIGH (ref 11.5–15.5)
WBC: 7.3 10*3/uL (ref 4.0–10.5)
nRBC: 0.4 % — ABNORMAL HIGH (ref 0.0–0.2)

## 2019-05-23 LAB — RENAL FUNCTION PANEL
Albumin: 2.5 g/dL — ABNORMAL LOW (ref 3.5–5.0)
Anion gap: 12 (ref 5–15)
BUN: 46 mg/dL — ABNORMAL HIGH (ref 8–23)
CO2: 31 mmol/L (ref 22–32)
Calcium: 8.9 mg/dL (ref 8.9–10.3)
Chloride: 94 mmol/L — ABNORMAL LOW (ref 98–111)
Creatinine, Ser: 1.95 mg/dL — ABNORMAL HIGH (ref 0.61–1.24)
GFR calc Af Amer: 39 mL/min — ABNORMAL LOW (ref >60)
GFR calc non Af Amer: 34 mL/min — ABNORMAL LOW (ref >60)
Glucose, Bld: 108 mg/dL — ABNORMAL HIGH (ref 70–99)
Phosphorus: 3.6 mg/dL (ref 2.5–4.6)
Potassium: 3.2 mmol/L — ABNORMAL LOW (ref 3.5–5.1)
Sodium: 137 mmol/L (ref 135–145)

## 2019-05-23 LAB — CULTURE, BLOOD (ROUTINE X 2)
Culture: NO GROWTH
Culture: NO GROWTH
Special Requests: ADEQUATE
Special Requests: ADEQUATE

## 2019-05-23 LAB — HEPARIN LEVEL (UNFRACTIONATED): Heparin Unfractionated: 0.41 IU/mL (ref 0.30–0.70)

## 2019-05-23 LAB — AMMONIA: Ammonia: 84 umol/L — ABNORMAL HIGH (ref 9–35)

## 2019-05-23 LAB — PROTIME-INR
INR: 2 — ABNORMAL HIGH (ref 0.8–1.2)
Prothrombin Time: 22.7 seconds — ABNORMAL HIGH (ref 11.4–15.2)

## 2019-05-23 LAB — COOXEMETRY PANEL
Carboxyhemoglobin: 3.1 % — ABNORMAL HIGH (ref 0.5–1.5)
Methemoglobin: 1.2 % (ref 0.0–1.5)
O2 Saturation: 86.2 %
Total hemoglobin: 7.7 g/dL — ABNORMAL LOW (ref 12.0–16.0)

## 2019-05-23 LAB — MAGNESIUM: Magnesium: 2.2 mg/dL (ref 1.7–2.4)

## 2019-05-23 MED ORDER — TORSEMIDE 20 MG PO TABS
100.0000 mg | ORAL_TABLET | Freq: Two times a day (BID) | ORAL | Status: DC
  Administered 2019-05-23 – 2019-05-26 (×7): 100 mg via ORAL
  Filled 2019-05-23 (×7): qty 5

## 2019-05-23 MED ORDER — WARFARIN - PHARMACIST DOSING INPATIENT: Freq: Every day | Status: DC

## 2019-05-23 MED ORDER — POTASSIUM CHLORIDE CRYS ER 20 MEQ PO TBCR
30.0000 meq | EXTENDED_RELEASE_TABLET | ORAL | Status: DC
  Administered 2019-05-23 (×2): 30 meq via ORAL
  Filled 2019-05-23 (×2): qty 1

## 2019-05-23 MED ORDER — RIFAXIMIN 200 MG PO TABS
400.0000 mg | ORAL_TABLET | Freq: Three times a day (TID) | ORAL | Status: DC
  Administered 2019-05-23 – 2019-05-24 (×4): 400 mg via ORAL
  Filled 2019-05-23 (×6): qty 2

## 2019-05-23 MED ORDER — POTASSIUM CHLORIDE ER 10 MEQ PO TBCR
30.0000 meq | EXTENDED_RELEASE_TABLET | ORAL | Status: AC
  Administered 2019-05-23 (×2): 30 meq via ORAL
  Filled 2019-05-23 (×3): qty 3

## 2019-05-23 MED ORDER — POTASSIUM CHLORIDE CRYS ER 20 MEQ PO TBCR: 30.0000 meq | EXTENDED_RELEASE_TABLET | ORAL | Status: DC

## 2019-05-23 MED ORDER — WARFARIN SODIUM 7.5 MG PO TABS
7.5000 mg | ORAL_TABLET | Freq: Once | ORAL | Status: AC
  Administered 2019-05-23: 7.5 mg via ORAL
  Filled 2019-05-23: qty 1

## 2019-05-23 NOTE — Progress Notes (Signed)
PROGRESS NOTE  Frank [name] MRN:[id number] DOB: 10/10/1945   PCP: Banks, Shannon R, MD  Patient is from: Home.  Lives with his wife.  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 Fib, lymphedema, CKD-4, debility, morbid obesity, recurrent hospitalization for sepsis secondary to Streptococcus from RLE cellulitis presenting with progressive dyspnea and weight gain.   In ED, hemodynamically stable except for brief tachycardia to 124.  Saturation 98% on room air. CXR consistent with CHF.  K7.0. Cr 2.67 (baseline~1.8).  INR 7.2.  Hgb 7.2 (baseline about 8.0).  EKG revealed paced rhythm.  COVID-19 negative.  Received IV Lasix 120 mg once and was started on Lasix drip by cardiologist.  Also received bicarb, IV insulin and D50, and admitted for CHF exacerbation, AKI, hyperkalemia, supratherapeutic INR and anemia.  Cardiology consulted and started on Lasix drip.  Hypokalemia resolved with Lokelma and IV Lasix.  INR improved after vitamin K.  Hemoglobin dropped requiring intermittent blood transfusions.  FOBT positive.  GI consulted and not planning EGD given his cardiac comorbidity unless signs of profuse bleeding.  Anticoagulation resumed with heparin drip.  Palliative care consulted and met with patient and wife on 2/9.  CODE STATUS changed to DNR/DNI. Plan to readdress GOC 3 days later if no improvement.  Patient failed to make significant progress clinically despite good diuresis and improvement in most of his numbers.  After long discussion between cardiology, wife and patient, the consensus was to transition to oral torsemide 100 mg twice daily, restart warfarin and transferred to residential hospice on 05/26/2019.   Subjective: No major events overnight or this morning.  Patient and wife had extensive discussion with advanced heart failure team this morning. The plan is to "get him feel better as  much as possible and transferred to residential hospice on 05/26/2019" He denies chest pain or dyspnea.  Feels tired.  Has been sitting on the edge of the bed  Objective: Vitals:   05/22/19 1440 05/22/19 2059 05/23/19 0441 05/23/19 0614  BP: 126/68 (!) 111/47 (!) 110/50   Pulse: 83 60 94   Resp:  17 15   Temp: (!) 97.5 F (36.4 C) 97.8 F (36.6 C) 98.2 F (36.8 C)   TempSrc: Oral Oral Oral   SpO2: 100% 92% 93%   Weight:    (!) 148.4 kg  Height:        Intake/Output Summary (Last 24 hours) at 05/23/2019 1248 Last data filed at 05/23/2019 0944 Gross per 24 hour  Intake 2199.82 ml  Output 1150 ml  Net 1049.82 ml   Filed Weights   05/21/19 0851 05/22/19 0630 05/23/19 0614  Weight: (!) 149.7 kg (!) 149.9 kg (!) 148.4 kg    Examination:  GENERAL: Chronically ill-appearing.  Appears fatigued and lethargic sitting on the edge of the bed. HEENT: MMM.  Vision and hearing grossly intact.  NECK: Supple.  Notable JVD RESP: On RA.  No IWOB.  Fair aeration bilaterally. CVS:  RRR.  2/6 SEM over RUSB and LUSB.  Mechanical valve sound. ABD/GI/GU: Bowel sounds present. Soft. Non tender MSK/EXT: 2+ pitting edema bilaterally.  Ace wrap over bilateral LEs. SKIN: no apparent skin lesion or wound NEURO: Awake, alert and oriented fairly.  No apparent focal neuro deficit. PSYCH: Calm.  Lethargic Procedures:  None  Assessment & Plan: Acute on chronic diastolic CHF: Echo in 12/20 with EF of 55 to 60%, moderate LVH, moderate LAE and severe   RAE, moderate to severe TVR.  Had significant fluid overload with massive lymphedema.  Presented with dyspnea and weight gain.  CXR concerning for CHF.  2.7 L and 2 unmeasured voids.  Renal function is stable. -Advanced HF team managing-started torsemide 100 mg twice daily. -Continue Aldactone to 100 mg daily mainly for hypokalemia. -Monitor fluid status, renal function and electrolytes -Sodium and fluid restrictions.  Acute metabolic encephalopathy likely due  to hepatic encephalopathy: Ammonia 110> 98> 92> 84.  No documented history of cirrhosis but at risk due to morbid obesity.  ABG reassuring.  Somewhat drowsy and lethargic this morning. -Increase lactulose to 30 g 3 times daily  AKI on CKD-4/azotemia: Baseline  Cr 1.8-2.0 > 2.67 (admit) >> 2.84>>1.91> 1.78> 1.95.  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 -Continue monitoring.  Acute on chronic blood loss anemia in in the setting of supratherapeutic INR: Hgb ~8.0 (baseline)> 7.2 (admit)>> 6.5>>4u>7.0>> 7.2>7.4.   INR 7.6>>2.0. FOBT positive.  Elevated LDH and low haptoglobin concerning for some element of valvular hemolysis.  H&H low but seems to be stable now. 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  Not a candidate for surgery. Supratherapeutic INR: INR 7.2> 7.6> vitamin K>> 4.8> 2.0 -Warfarin resumed.  Paroxysmal atrial fibrillation: NSR.  Not on rate or rhythm control. -Anticoagulation as above  Hyponatremia: Likely due to renal failure.  Resolved.  Hypokalemia: K3.2..  Likely due to diuretics and lactulose.  Magnesium within normal. -P.o. KCl 30 mEq every 3 hours x4. -Aldactone to 100 mg daily. -Continue monitoring  At risk for sleep apnea/OHS: Body mass index is 45.63 kg/m.  Refuses nightly CPAP.  Recent ABG without significant hypercarbia. -Discontinued CPAP.  Recent hospitalization for group G Streptococcus bacteremia from RLE cellulitis in patient with lymphedema -Completed antibiotic course with penicillin G followed by oral Zyvox for a total of 14 days through 12/22. -Evaluated by ID, Dr. Van Dam on 1/11 and prescribed Augmentin for 2 more weeks with refill. -Blood cultures negative x2 this admission. -Received amoxicillin  2/3-2/9.  Monitor off antibiotics.   Left chest wall pain: noted small bruise laterally.  Tender to palpation. -Continue scheduled Tylenol with as needed oxycodone.  -Lidoderm to left chest wall pain.  Generalized weakness/debility -PT/OT  Lymphedema/possible right LE cellulitis: Improving. -Diuretics as above. -Appreciate WOC  Morbid obesity: Body mass index is 45.63 kg/m. -Encourage lifestyle change to lose weight  Goal of care/DNR/DNI: Patient is full code.  Significant comorbidities as above poor prognosis.  Entertain palliative care consult, and was interested.  Palliative care consulted and met with patient and family.   -2/9-DNR/DNI  -2/12-plan for transfer to residential hospice on 2/15 -Palliative care following-May need further clarification on goal of care         DVT prophylaxis: On heparin drip Code Status: DNR/DNI Family Communication: Updated patient's wife at bedside.  Discharge barrier: Complex multiple medical issues as above Patient is from: Home Final disposition: [facility] on 2/15  Consultants: Cardiology   Microbiology summarized: COVID-19 and influenza PCR negative. Blood cultures negative so far  Sch Meds:  Scheduled Meds: . sodium chloride   Intravenous Once  . sodium chloride   Intravenous Once  . acetaminophen  1,000 mg Oral Q8H  . acetaZOLAMIDE  250 mg Oral BID  .   ferrous sulfate  325 mg Oral Q breakfast  . lactulose  20 g Oral TID  . lidocaine  1 patch Transdermal Q24H  . mouth rinse  15 mL Mouth Rinse BID  . pantoprazole  40 mg Oral BID  . potassium chloride  30 mEq Oral Q3H  . rifaximin  400 mg Oral TID  . spironolactone  100 mg Oral Daily  . torsemide  100 mg Oral BID  . warfarin  7.5 mg Oral ONCE-1800  . Warfarin - Pharmacist Dosing Inpatient   Does not apply q1800   Continuous Infusions: . heparin 1,300 Units/hr (05/23/19 0443)   PRN Meds:.lip balm, oxyCODONE, polyethylene glycol, sodium chloride  flush  Antimicrobials: Anti-infectives (From admission, onward)   Start     Dose/Rate Route Frequency Ordered Stop   05/23/19 1000  rifaximin (XIFAXAN) tablet 400 mg     400 mg Oral 3 times daily 05/23/19 0916     05/14/19 1700  amoxicillin (AMOXIL) capsule 500 mg  Status:  Discontinued     500 mg Oral Every 8 hours 05/14/19 1620 05/20/19 1359       I have personally reviewed the following labs and images: CBC: Recent Labs  Lab 05/18/19 0455 05/18/19 0455 05/18/19 1641 05/19/19 0500 05/20/19 0343 05/21/19 0500 05/23/19 0515  WBC 8.6  --   --  7.9 7.0 6.4 7.3  HGB 7.0*   < > 7.5* 7.3* 7.2* 7.4* 7.5*  HCT 22.7*   < > 25.0* 23.5* 23.4* 24.7* 25.7*  MCV 95.0  --   --  96.3 97.1 100.4* 102.8*  PLT 257  --   --  260 257 265 261   < > = values in this interval not displayed.   BMP &GFR Recent Labs  Lab 05/19/19 0500 05/19/19 0500 05/20/19 0343 05/20/19 0343 05/20/19 1350 05/20/19 1350 05/21/19 0500 05/21/19 1720 05/22/19 0428 05/22/19 1820 05/23/19 0515  NA 139   < > 139   < > 138  --  138 138 138  --  137  K 2.9*   < > 2.3*   < > 2.6*   < > 2.6* 3.2* 3.2* 3.2* 3.2*  CL 91*   < > 95*   < > 95*  --  94* 93* 93*  --  94*  CO2 32   < > 31   < > 30  --  32 32 32  --  31  GLUCOSE 109*   < > 108*   < > 120*  --  104* 119* 112*  --  108*  BUN 69*   < > 62*   < > 58*  --  54* 51* 51*  --  46*  CREATININE 1.91*   < > 1.78*   < > 1.86*  --  2.00* 1.95* 1.90*  --  1.95*  CALCIUM 8.7*   < > 8.6*   < > 8.3*  --  8.6* 8.7* 8.8*  --  8.9  MG 2.4  --  2.2  --   --   --  2.1  --  2.2  --  2.2  PHOS 4.1  --  4.1  --   --   --  3.7  --  2.9  --  3.6   < > = values in this interval not displayed.   Estimated Creatinine Clearance: 51.4 mL/min (A) (by C-G formula based on SCr of 1.95 mg/dL (H)). Liver & Pancreas: Recent Labs  Lab 05/20/19 0343 05/21/19 0500 05/21/19  1720 05/22/19 0428 05/23/19 0515  AST  --   --  46*  --   --   ALT  --   --  16  --   --   ALKPHOS  --   --  105   --   --   BILITOT  --   --  1.2  --   --   PROT  --   --  7.0  --   --   ALBUMIN 2.5* 2.4* 2.4* 2.5* 2.5*   No results for input(s): LIPASE, AMYLASE in the last 168 hours. Recent Labs  Lab 05/18/19 1641 05/20/19 0343 05/21/19 0500 05/23/19 0515  AMMONIA 110* 96* 92* 84*   Diabetic: No results for input(s): HGBA1C in the last 72 hours. No results for input(s): GLUCAP in the last 168 hours. Cardiac Enzymes: No results for input(s): CKTOTAL, CKMB, CKMBINDEX, TROPONINI in the last 168 hours. No results for input(s): PROBNP in the last 8760 hours. Coagulation Profile: Recent Labs  Lab 05/19/19 0500 05/20/19 0343 05/21/19 0500 05/22/19 0428 05/23/19 0515  INR 2.3* 2.2* 2.2* 2.2* 2.0*   Thyroid Function Tests: No results for input(s): TSH, T4TOTAL, FREET4, T3FREE, THYROIDAB in the last 72 hours. Lipid Profile: No results for input(s): CHOL, HDL, LDLCALC, TRIG, CHOLHDL, LDLDIRECT in the last 72 hours. Anemia Panel: No results for input(s): VITAMINB12, FOLATE, FERRITIN, TIBC, IRON, RETICCTPCT in the last 72 hours. Urine analysis:    Component Value Date/Time   COLORURINE YELLOW 03/17/2019 0054   APPEARANCEUR CLOUDY (A) 03/17/2019 0054   LABSPEC 1.006 03/17/2019 0054   PHURINE 6.0 03/17/2019 0054   GLUCOSEU NEGATIVE 03/17/2019 0054   HGBUR SMALL (A) 03/17/2019 0054   BILIRUBINUR NEGATIVE 03/17/2019 0054   KETONESUR NEGATIVE 03/17/2019 0054   PROTEINUR 30 (A) 03/17/2019 0054   NITRITE NEGATIVE 03/17/2019 0054   LEUKOCYTESUR LARGE (A) 03/17/2019 0054   Sepsis Labs: Invalid input(s): PROCALCITONIN, [place]  Microbiology: Recent Results (from the past 240 hour(s))  Respiratory Panel by RT PCR (Flu A&B, Covid) - Nasopharyngeal Swab     Status: None   Collection Time: 05/13/19  8:19 PM   Specimen: Nasopharyngeal Swab  Result Value Ref Range Status   SARS Coronavirus 2 by RT PCR NEGATIVE NEGATIVE Final    Comment: (NOTE) SARS-CoV-2 target nucleic acids are NOT  DETECTED. The SARS-CoV-2 RNA is generally detectable in upper respiratoy specimens during the acute phase of infection. The lowest concentration of SARS-CoV-2 viral copies this assay can detect is 131 copies/mL. A negative result does not preclude SARS-Cov-2 infection and should not be used as the sole basis for treatment or other patient management decisions. A negative result may occur with  improper specimen collection/handling, submission of specimen other than nasopharyngeal swab, presence of viral mutation(s) within the areas targeted by this assay, and inadequate number of viral copies (<131 copies/mL). A negative result must be combined with clinical observations, patient history, and epidemiological information. The expected result is Negative. Fact Sheet for Patients:  PinkCheek.be Fact Sheet for Healthcare Providers:  GravelBags.it This test is not yet ap proved or cleared by the [place] FDA and  has been authorized for detection and/or diagnosis of SARS-CoV-2 by FDA under an Emergency Use Authorization (EUA). This EUA will remain  in effect (meaning this test can be used) for the duration of the COVID-19 declaration under Section 564(b)(1) of the Act, 21 U.S.C. section 360bbb-3(b)(1), unless the authorization is terminated or revoked sooner.    Influenza A by PCR NEGATIVE  NEGATIVE Final   Influenza B by PCR NEGATIVE NEGATIVE Final    Comment: (NOTE) The Xpert Xpress SARS-CoV-2/FLU/RSV assay is intended as an aid in  the diagnosis of influenza from Nasopharyngeal swab specimens and  should not be used as a sole basis for treatment. Nasal washings and  aspirates are unacceptable for Xpert Xpress SARS-CoV-2/FLU/RSV  testing. Fact Sheet for Patients: PinkCheek.be Fact Sheet for Healthcare Providers: GravelBags.it This test is not yet approved or cleared  by the [place] FDA and  has been authorized for detection and/or diagnosis of SARS-CoV-2 by  FDA under an Emergency Use Authorization (EUA). This EUA will remain  in effect (meaning this test can be used) for the duration of the  Covid-19 declaration under Section 564(b)(1) of the Act, 21  U.S.C. section 360bbb-3(b)(1), unless the authorization is  terminated or revoked. Performed at [facility], [place] [address] [place], [place] [address]   Culture, blood (routine x 2)     Status: None   Collection Time: 05/14/19  2:28 PM   Specimen: BLOOD RIGHT HAND  Result Value Ref Range Status   Specimen Description BLOOD RIGHT HAND  Final   Special Requests   Final    BOTTLES DRAWN AEROBIC ONLY Blood Culture adequate volume   Culture   Final    NO GROWTH 9 DAYS Performed at [facility], [place] [address] [place], [place] [address]    Report Status 05/23/2019 FINAL  Final  Culture, blood (routine x 2)     Status: None   Collection Time: 05/14/19  2:34 PM   Specimen: BLOOD LEFT HAND  Result Value Ref Range Status   Specimen Description BLOOD LEFT HAND  Final   Special Requests   Final    BOTTLES DRAWN AEROBIC ONLY Blood Culture adequate volume   Culture   Final    NO GROWTH 9 DAYS Performed at [facility], [place] [address] [place], [place] [address]    Report Status 05/23/2019 FINAL  Final    Radiology Studies: No results found.  Dorris Pierre T. [place]  If 7PM-7AM, please contact night-coverage [email or web address] Password [facility] 05/23/2019, 12:48 PM

## 2019-05-23 NOTE — Plan of Care (Signed)

## 2019-05-23 NOTE — TOC Initial Note (Signed)
Transition of Care [facility]) - Initial/Assessment Note    Patient Details  Name: Frank [name] MRN: [id number] Date of Birth: [birth year]  [facility] CM/SW Contact:    Arvella Merles, LCSW Phone Number: 05/23/2019, 4:35 PM  Clinical Narrative:                 CSW received consult for possible hospice placement at time of discharge. CSW spoke with patient's wife [name] at bedside regarding hospice at time of discharge. Patient's wife stated she is unable to care for patient at home and hospice is the best option at this time. Patient's wife reports preference for [facility].   CSW contacted [facility] [phone] and received voicemail due to it being after hours. CSW faxed referral to [facility] [phone]. No further questions reported at this time. CSW to continue to follow and assist with discharge planning needs.   Expected Discharge Plan: [place] Barriers to Discharge: Continued Medical Work up   Patient Goals and CMS Choice   CMS Medicare.gov Compare Post Acute Care list provided to:: Patient Represenative (must comment)([name], wife)    Expected Discharge Plan and Services Expected Discharge Plan: [place]       Living arrangements for the past 2 months: Single Family Home                           HH Arranged: Social Work [facility] Agency: [facility] Date [place]: 05/23/19 Time [place]: [place]    Prior Living Arrangements/Services Living arrangements for the past 2 months: [place] with:: Spouse Patient language and need for interpreter reviewed:: Yes Do you feel safe going back to the place where you live?: Yes      Need for Family Participation in Patient Care: Yes (Comment) Care giver support system in place?: Yes (comment)   Criminal Activity/Legal Involvement Pertinent to Current Situation/Hospitalization: No -  Comment as needed  Activities of Daily Living Home Assistive Devices/Equipment: Bedside commode/3-in-1, Walker (specify type) ADL Screening (condition at time of admission) Patient's cognitive ability adequate to safely complete daily activities?: Yes Is the patient deaf or have difficulty hearing?: No Does the patient have difficulty seeing, even when wearing glasses/contacts?: No Does the patient have difficulty concentrating, remembering, or making decisions?: No Patient able to express need for assistance with ADLs?: Yes Does the patient have difficulty dressing or bathing?: Yes Independently performs ADLs?: No Communication: Independent Dressing (OT): Needs assistance Is this a change from baseline?: Pre-admission baseline Grooming: Needs assistance Is this a change from baseline?: Pre-admission baseline Feeding: Needs assistance Is this a change from baseline?: Pre-admission baseline Bathing: Needs assistance Is this a change from baseline?: Pre-admission baseline Toileting: Needs assistance Is this a change from baseline?: Pre-admission baseline In/Out Bed: Needs assistance Is this a change from baseline?: Pre-admission baseline Walks in Home: Independent with device (comment) Does the patient have difficulty walking or climbing stairs?: Yes Weakness of Legs: Both Weakness of Arms/Hands: Both  Permission Sought/Granted Permission sought to share information with : Facility Sport and exercise psychologist, Family Supports    Share Information with NAME: [name]  Permission granted to share info w AGENCY: Hospice  Permission granted to share info w Relationship: Wife  Permission granted to share info w Contact Information: [phone]  Emotional Assessment   Attitude/Demeanor/Rapport: Unable to Assess Affect (typically observed): Unable to Assess Orientation: : Oriented to  Self, Oriented to Place, Oriented to  Time, Oriented to Situation Alcohol / Substance Use: Not  Applicable Psych Involvement: No (comment)  Admission diagnosis:  Acute CHF (congestive heart failure) (HCC) [I50.9] Patient Active Problem List   Diagnosis Date Noted  . Encephalopathy due to ammonia   . DNR (do not resuscitate)   . Slow transit constipation   . Palliative care encounter   . Palliative care by specialist   . Anemia   . Heme positive stool   . Anticoagulated   . Acute renal failure superimposed on stage 3b chronic kidney disease ([place]) 05/14/2019  . Hyperkalemia 05/14/2019  . Acute on chronic right heart failure ([place])   . Status post biventricular pacemaker   . Acute CHF (congestive heart failure) ([place]) 05/13/2019  . [place] 03/23/2019  . Hypotension 03/20/2019  . Group G streptococcal infection 03/19/2019  . Bacteremia 03/19/2019  . Sepsis ([place]) 03/17/2019  . UTI (urinary tract infection) 03/17/2019  . Acute respiratory failure with hypoxia ([place]) 03/17/2019  . Acute exacerbation of CHF (congestive heart failure) ([place]) 03/17/2019  . AKI (acute kidney injury) ([place]) 03/17/2019  . Change in bowel habits   . Rectal bleeding   . Pulmonary hypertension ([place]) 12/17/2017  . Complete heart block ([place]) 07/28/2017  . OSA (obstructive sleep apnea) 06/08/2017  . NICM (nonischemic cardiomyopathy) ([place]) 05/28/2017  . Chronic combined systolic and diastolic heart failure ([place]) 01/18/2017  . Dyspnea on exertion 07/12/2015  . Encounter for therapeutic drug monitoring 05/19/2013  . Cardiac pacemaker-[employer] 06/22/2011  . S/P mitral valve replacement-Mechanical 10/10/2010  . Atrial fibrillation/tachycardia 06/08/2010  . TIA (transient ischemic attack) 06/08/2010  . S/P aortic valve repair 06/08/2010  . Acute on chronic diastolic heart failure ([place]) 08/10/2009  . VENTRICULAR TACHYCARDIA 11/23/2008  . MORBID OBESITY 06/10/2007  . Essential hypertension 06/10/2007  . 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 DYSPNEA 06/10/2007  . CT, CHEST, ABNORMAL  06/10/2007  . MELANOMA, HX OF 06/10/2007   PCP:  [name], MD Pharmacy:   [facility] [address], [place] - [address] [place] [place] [address] Phone: [phone] Fax: [phone]  [facility] [place] [address] [place] [address] Phone: [phone] Fax: [phone]     Social Determinants of Health (SDOH) Interventions    Readmission Risk Interventions No flowsheet data found.

## 2019-05-23 NOTE — Progress Notes (Signed)
Return visit to Mr. Mckinny.  He acknowledged my presence but appeared to be in too much pain to talk.  [name], his wife, talked quite a bit.  Mr. Nakanishi unable to find a comfortable place in the bed and exhibited being in pain; restless.  Excused myself from the room because I felt our conversation was disruptive for Mr. Matthew.  Asked his wife to have nurse page me if she [name]) was able to take a break out of the room and wanted to speak with me.  Plan to continue visiting Mr. Behanna for the duration of his stay.    [place] Chaplain Resident

## 2019-05-23 NOTE — Progress Notes (Signed)
PT Cancellation Note  Patient Details Name: Frank [name] MRN: [id number] DOB: 05-02-1945   Cancelled Treatment:    Reason Eval/Treat Not Completed: Other (comment) made multiple attempts to see patient today- on first attempt he was fast asleep (but wife proudly reports he stood and took sidesteps along bed earlier), on second attempt he was receiving care from nursing. Will continue to follow acutely.    Windell Norfolk, DPT, PN1   Supplemental Physical Therapist [facility]    Pager [phone] Acute Rehab Office [phone]

## 2019-05-23 NOTE — Progress Notes (Signed)
[place] for Coumadin / heparin  Indication: Atrial fibrillation and mechanical heart valve  Allergies  Allergen Reactions  . Warfarin And Related Other (See Comments)    COUMADIN or JANTOVEN BRAND ONLY** Per pt, was switched to generic in 2001 and had significantly decreased absorption > INR subtherapeutic> pt suffered a TIA and has been on brand name only since     Patient Measurements: Height: 5\' 11"  (180.3 cm) Weight: (!) 327 lb 2.6 oz (148.4 kg) IBW/kg (Calculated) : 75.3  Vital Signs: Temp: 98.2 F (36.8 C) (02/12 0441) Temp Source: Oral (02/12 0441) BP: 110/50 (02/12 0441) Pulse Rate: 94 (02/12 0441)  Labs: Recent Labs    05/21/19 0500 05/21/19 0500 05/21/19 0952 05/21/19 1720 05/22/19 0428 05/23/19 0515  HGB 7.4*  --   --   --   --  7.5*  HCT 24.7*  --   --   --   --  25.7*  PLT 265  --   --   --   --  261  LABPROT 24.1*  --   --   --  24.4* 22.7*  INR 2.2*  --   --   --  2.2* 2.0*  HEPARINUNFRC 0.93*   < > 0.43  --  0.47 0.41  CREATININE 2.00*   < >  --  1.95* 1.90* 1.95*   < > = values in this interval not displayed.    Estimated Creatinine Clearance: 51.4 mL/min (A) (by C-G formula based on SCr of 1.95 mg/dL (H)).    Assessment: 60 yoM on warfarin PTA for AF and mMVR admitted with CHF. Warfarin held for possible procedures and vitamin K given earlier in admit with supratherapeutic INR on admit. Heparin remains therapeutic, INR 2.0, CBC stable. Plan to restart warfarin today in anticipation of discharge Monday. Home dose below but noted that pt has poor appetite and INR was elevated on admit so will be cautious in restarting.  *Home dose = 10mg  daily except 15mg  Wed  Goal of Therapy:  Heparin level: 0.3-0.5 INR 2.5-3.5 Monitor platelets by anticoagulation protocol: Yes   Plan:  -Heparin 1300 units/h -Warfarin 7.5mg  PO x1 -Daily INR, CBC, heparin level   Arrie Senate, PharmD, BCPS Clinical  Pharmacist [phone] Please check AMION for all [place] numbers 05/23/2019

## 2019-05-23 NOTE — Progress Notes (Signed)
Manufacturing engineer [facility])  Referral received for residential hospice at [facility] in [place] from Dr. Hilma Favors. Spoke with wife, offered support and acknowledged referral.  Pt was referred to [place] too (family lived in [place], but wife works in [place]).  Wife states she wants whichever facility will accept him.  Pt is not eligible for residential hospice at this time at [facility] per Dr. Gilford Rile with [facility].  He does not currently meet the admission criteria, unless he were to decompensate further.  Per Dr. Gilford Rile, she feels that the pt has > 2 weeks to live.  [facility] will support as needed.  Please let us know if we can be of further help.  Venia Carbon RN, BSN, [facility] Liaison (in [place]) [phone]

## 2019-05-23 NOTE — Progress Notes (Addendum)
Advanced Heart Failure Rounding Note   Subjective:     On milrinone 0.125 . Co-ox 86%   On lasix gtt at 30 gtt + diamox + spiro. 2.6L in UOP yesterday. Wt down 3 pounds (20 pounds total) .  Creatinine stable 1.95  Still very weak. C/o pain around his condom cath. Unable to stand. On heparin. No bleeding.   Continues with lactulose 20 tid. Ammonia down to 86  Objective:   Weight Range:  Vital Signs:   Temp:  [97.5 F (36.4 C)-98.2 F (36.8 C)] 98.2 F (36.8 C) (02/12 0441) Pulse Rate:  [60-94] 94 (02/12 0441) Resp:  [15-17] 15 (02/12 0441) BP: (110-126)/(47-68) 110/50 (02/12 0441) SpO2:  [92 %-100 %] 93 % (02/12 0441) Weight:  [148.4 kg] 148.4 kg (02/12 0614) Last BM Date: 05/22/19  Weight change: Filed Weights   05/21/19 0851 05/22/19 0630 05/23/19 0614  Weight: (!) 149.7 kg (!) 149.9 kg (!) 148.4 kg    Intake/Output:   Intake/Output Summary (Last 24 hours) at 05/23/2019 0907 Last data filed at 05/23/2019 0645 Gross per 24 hour  Intake 2295.03 ml  Output 2650 ml  Net -354.97 ml     Physical Exam: General: obese and chronically ill WM, + resting tremor, No respiratory difficulty, sitting up on side of bed HEENT: normal Neck: supple JVP to jaw  Carotids 2+ bilat; no bruits. No lymphadenopathy or thryomegaly appreciated. Cor: PMI nondisplaced. Regular rate & rhythm. Mech s1, 2/6 AS/TR Lungs: clear Abdomen: obese soft, nontender, nondistended. No hepatosplenomegaly. No bruits or masses. Good bowel sounds. Extremities: no cyanosis, clubbing, rash, 3-4+ edema into thighs + uNNA Neuro: alert & orientedx3, cranial nerves grossly intact. moves all 4 extremities w/o difficulty. Very weak   Telemetry: AV paced 70-90 Personally reviewed   Labs: Basic Metabolic Panel: Recent Labs  Lab 05/19/19 0500 05/19/19 0500 05/20/19 0343 05/20/19 0343 05/20/19 1350 05/20/19 1350 05/21/19 0500 05/21/19 0500 05/21/19 1720 05/22/19 0428 05/22/19 1820 05/23/19 0515    NA 139   < > 139   < > 138  --  138  --  138 138  --  137  K 2.9*   < > 2.3*   < > 2.6*   < > 2.6*  --  3.2* 3.2* 3.2* 3.2*  CL 91*   < > 95*   < > 95*  --  94*  --  93* 93*  --  94*  CO2 32   < > 31   < > 30  --  32  --  32 32  --  31  GLUCOSE 109*   < > 108*   < > 120*  --  104*  --  119* 112*  --  108*  BUN 69*   < > 62*   < > 58*  --  54*  --  51* 51*  --  46*  CREATININE 1.91*   < > 1.78*   < > 1.86*  --  2.00*  --  1.95* 1.90*  --  1.95*  CALCIUM 8.7*   < > 8.6*   < > 8.3*   < > 8.6*   < > 8.7* 8.8*  --  8.9  MG 2.4  --  2.2  --   --   --  2.1  --   --  2.2  --  2.2  PHOS 4.1  --  4.1  --   --   --  3.7  --   --  2.9  --  3.6   < > = values in this interval not displayed.    Liver Function Tests: Recent Labs  Lab 05/20/19 0343 05/21/19 0500 05/21/19 1720 05/22/19 0428 05/23/19 0515  AST  --   --  46*  --   --   ALT  --   --  16  --   --   ALKPHOS  --   --  105  --   --   BILITOT  --   --  1.2  --   --   PROT  --   --  7.0  --   --   ALBUMIN 2.5* 2.4* 2.4* 2.5* 2.5*   No results for input(s): LIPASE, AMYLASE in the last 168 hours. Recent Labs  Lab 05/20/19 0343 05/21/19 0500 05/23/19 0515  AMMONIA 96* 92* 84*    CBC: Recent Labs  Lab 05/18/19 0455 05/18/19 0455 05/18/19 1641 05/19/19 0500 05/20/19 0343 05/21/19 0500 05/23/19 0515  WBC 8.6  --   --  7.9 7.0 6.4 7.3  HGB 7.0*   < > 7.5* 7.3* 7.2* 7.4* 7.5*  HCT 22.7*   < > 25.0* 23.5* 23.4* 24.7* 25.7*  MCV 95.0  --   --  96.3 97.1 100.4* 102.8*  PLT 257  --   --  260 257 265 261   < > = values in this interval not displayed.    Cardiac Enzymes: No results for input(s): CKTOTAL, CKMB, CKMBINDEX, TROPONINI in the last 168 hours.  BNP: BNP (last 3 results) Recent Labs    03/16/19 2359 05/13/19 1930  BNP 105.7* 163.6*    ProBNP (last 3 results) No results for input(s): PROBNP in the last 8760 hours.    Other results:  Imaging: No results found.   Medications:     Scheduled  Medications: . sodium chloride   Intravenous Once  . sodium chloride   Intravenous Once  . acetaminophen  1,000 mg Oral Q8H  . acetaZOLAMIDE  250 mg Oral BID  . ferrous sulfate  325 mg Oral Q breakfast  . lactulose  20 g Oral TID  . lidocaine  1 patch Transdermal Q24H  . mouth rinse  15 mL Mouth Rinse BID  . pantoprazole  40 mg Oral BID  . potassium chloride  30 mEq Oral Q3H  . spironolactone  100 mg Oral Daily    Infusions: . furosemide (LASIX) infusion 30 mg/hr (05/23/19 0616)  . heparin 1,300 Units/hr (05/23/19 0443)  . milrinone 0.125 mcg/kg/min (05/23/19 0438)    PRN Medications: lip balm, oxyCODONE, polyethylene glycol, sodium chloride flush   Assessment/Plan:   1. 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  - Remains markedly volume overloaded with severe R-sided HF in the setting of wide open TR (which I suspect is due to torn TV) - Was not responding well to lasix gtt at 20 with diamox. Milrinone added 2/5 - Co-ox 86% on milrinone 0.125.  - Renal function improved with milrinone.  - Urine output good but weight loss quite sluggish.  2. 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  - not candidate for surgery - no change  3. AKI on CKD  - Likely cardio renal in nature - improved on milrinone  4. Anemia  - suspect combination of GI ad low-grade valvular hemolysis - hgb 7.5 today  5. Mechanical MVR with moderate MS - INR down to 2.0 - Coumadin has been  on hold. Now on heparin    6. Moderate AS - medical therapy   7. Obesity  -Body mass index is 47.84 kg/m.  8. Hypokalemia - K 3.2 today  - continue spiro - follow BMP  9. 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 Add rifaximin while in hospital only to help get ammonia down before d/c  10. NSVT/SVT - supp K per above  11. GOC - I had long talk with him and  his wife today and they are very thoughtful about this. We discussed the fact that although his numbers look some better on paper he has clinically not made any progress and perhaps has even gotten worse. He is uncomfortable. I think there is very little chance he will make substantial recovery. They are now interested in residential hospice on Monday and would like o use the weekend to get his ammonia down as much as possible.  - Plan as below - d/w TRH personally.  1. Stop milrinone and IV lasix. Start po torsemide 100 bid 2. Restart warfarin given mechanical MV. D/w PharmD. Will try to get INR close to 2.5 by Monday (He has previously used Axitra as bridge if needed) 3. Continue lactulose 30 tid. Add rifaxamin while in hospital (not for d/c) 4. Plan d/c to [place] on Monday. I d/w Dr. Hilma Favors who will see and arrange for him  Total time spent 45 minutes. Over half that time spent discussing above.   Glori Bickers, MD  9:17 AM     Length of Stay: 10

## 2019-05-24 DIAGNOSIS — I509 Heart failure, unspecified: Secondary | ICD-10-CM

## 2019-05-24 LAB — MAGNESIUM: Magnesium: 2.3 mg/dL (ref 1.7–2.4)

## 2019-05-24 LAB — CBC
HCT: 26.5 % — ABNORMAL LOW (ref 39.0–52.0)
Hemoglobin: 7.9 g/dL — ABNORMAL LOW (ref 13.0–17.0)
MCH: 30.2 pg (ref 26.0–34.0)
MCHC: 29.8 g/dL — ABNORMAL LOW (ref 30.0–36.0)
MCV: 101.1 fL — ABNORMAL HIGH (ref 80.0–100.0)
Platelets: 240 10*3/uL (ref 150–400)
RBC: 2.62 MIL/uL — ABNORMAL LOW (ref 4.22–5.81)
RDW: 21.5 % — ABNORMAL HIGH (ref 11.5–15.5)
WBC: 10.8 10*3/uL — ABNORMAL HIGH (ref 4.0–10.5)
nRBC: 0.6 % — ABNORMAL HIGH (ref 0.0–0.2)

## 2019-05-24 LAB — RENAL FUNCTION PANEL
Albumin: 2.5 g/dL — ABNORMAL LOW (ref 3.5–5.0)
Anion gap: 12 (ref 5–15)
BUN: 51 mg/dL — ABNORMAL HIGH (ref 8–23)
CO2: 32 mmol/L (ref 22–32)
Calcium: 9.2 mg/dL (ref 8.9–10.3)
Chloride: 95 mmol/L — ABNORMAL LOW (ref 98–111)
Creatinine, Ser: 2.16 mg/dL — ABNORMAL HIGH (ref 0.61–1.24)
GFR calc Af Amer: 34 mL/min — ABNORMAL LOW (ref >60)
GFR calc non Af Amer: 30 mL/min — ABNORMAL LOW (ref >60)
Glucose, Bld: 97 mg/dL (ref 70–99)
Phosphorus: 4.2 mg/dL (ref 2.5–4.6)
Potassium: 3.8 mmol/L (ref 3.5–5.1)
Sodium: 139 mmol/L (ref 135–145)

## 2019-05-24 LAB — PROTIME-INR
INR: 2.2 — ABNORMAL HIGH (ref 0.8–1.2)
Prothrombin Time: 24.5 seconds — ABNORMAL HIGH (ref 11.4–15.2)

## 2019-05-24 LAB — HEPARIN LEVEL (UNFRACTIONATED): Heparin Unfractionated: 0.39 IU/mL (ref 0.30–0.70)

## 2019-05-24 LAB — AMMONIA: Ammonia: 89 umol/L — ABNORMAL HIGH (ref 9–35)

## 2019-05-24 MED ORDER — SPIRONOLACTONE 25 MG PO TABS
25.0000 mg | ORAL_TABLET | Freq: Every day | ORAL | Status: DC
  Administered 2019-05-25 – 2019-05-26 (×2): 25 mg via ORAL
  Filled 2019-05-24 (×2): qty 1

## 2019-05-24 MED ORDER — LACTULOSE 10 GM/15ML PO SOLN: 20.0000 g | Freq: Every day | ORAL | Status: DC

## 2019-05-24 MED ORDER — LACTULOSE 10 GM/15ML PO SOLN
20.0000 g | Freq: Two times a day (BID) | ORAL | Status: DC
  Administered 2019-05-24 – 2019-05-25 (×2): 20 g via ORAL
  Filled 2019-05-24 (×2): qty 30

## 2019-05-24 MED ORDER — WARFARIN SODIUM 7.5 MG PO TABS
7.5000 mg | ORAL_TABLET | Freq: Once | ORAL | Status: AC
  Administered 2019-05-24: 7.5 mg via ORAL
  Filled 2019-05-24: qty 1

## 2019-05-24 NOTE — Progress Notes (Addendum)
[facility] ([facility])  Pt is eligible for inpatient admission to hospice home per Dr. Konrad Dolores. Currently, there are no beds available today but we will place Frank [name] on the waiting list.  Thank you, Venia Carbon RN, BSN, [facility] Liaison (in [place]) [phone]  **request for [facility] from PMT, updated [facility].  Will update family, TOC and hospital team once bed is available.

## 2019-05-24 NOTE — Care Management (Signed)
[place] has an extensive waiting list and family also inquired about [facility]'s residential facility. [facility] contacted.  Otila Kluver relays that Mr Mavis is on the waiting list there as well and there are 3 people ahead of him.  They are not able to give a potential time frame for admission.

## 2019-05-24 NOTE — Progress Notes (Signed)
Discussed with Dr. Hilma Favors this am.  Will discuss further with [facility] to ensure they truly have an accurate picture of his clinical condition.   Florentina Jenny, PA-C Palliative Medicine Office:  [phone]

## 2019-05-24 NOTE — Progress Notes (Signed)
With updated information Mr. Chavarin meets criteria for admission to [facility] however they have a long waiting list.    Will request TOC to please try [facility] in [place] to determine if they could take him sooner.    Florentina Jenny, PA-C Palliative Medicine Office:  [phone]

## 2019-05-24 NOTE — Progress Notes (Addendum)
Daily Progress Note   Patient Name: Frank [name]       Date: 05/24/2019 DOB: 1945/06/13  Age: 74 y.o. MRN#: [id number] Attending Physician: Mercy Riding, MD Primary Care Physician: [name], MD Admit Date: 05/13/2019  Chart reviewed.  Discussed with primary team, [facility] and TOC   Reason for Consultation/Follow-up: Establishing goals of care and Psychosocial/spiritual support  Subjective: Frank [name] is alert and orientated today.  Before my visit he told Frank [name] he wanted to be a full code.    Frank [name] and I had a long conversation (60+ min).  We discussed his current state - new cardiac cirrhosis with encephalopathy, kidney failure, long term heart failure issues.  Frank [name] admitted he hates taking lactulose and wants to get out of the hospital.  With Frank [name]'s permission I spoke frankly but gently to Frank [name] about his prognosis.  Frank [name] understands his prognosis is very short.  We talked about DNR.  It comes into play only if you arrest.  I described a DNR scenario as well as a full code scenario - in which [name] would end up on full life support if the code was successful.  After listening to the two scenarios Frank [name] choose DNR.  I believe his motivation is that he would not want Frank [name] to have to suffer thru making decisions about being on or off of life support.  We talked about disposition.  Frank [name] remembers Dr. Haroldine [name] explaining that we have done all we can do, and that his next hope is at [name].  I explained [name] is completely focused on his comfort - no IVs etc.  Frank [name] and Frank [name] talked with me extensively about medications that they want at [facility].  [name] would like to continue his coumadin and torsemide if possible.  I explained that another Doctor will manage his  care at [facility] and that Doctor will make the most appropriate decisions for him at that time.  [name] began to explain that he will need XX amount of potassium and XX amount of magnesium.  I explained that his kidney function is fluctuating and it will likely not be appropriate to take a lot of potassium - we need to put it in the hands of the Doctor at [name].  Frank [name] was OK with doing that.  Frank [name]  and Frank [name] were hopeful that he could continue taking some lactulose as he is "brighter" today.   I strongly encouraged the couple to talk with his kids while he is so good today.  Frank [name] understood that this good mental status will be fleeting.   Assessment: Milrinone discontinued.  Creatinine beginning to increase.  Very fatigued but mentally clearer today.  I emphasized that this will be fleeting.  Nearing EOL.   Patient Profile/HPI:    74 y.o. male  with past medical history of chronic heart failure with preserved ejection fraction, HOCM s/p myomectomy, valvular disease with MVR, atrial fibrillation, pacemaker placement, CKD 4, and morbid obesity who was admitted on 05/13/2019 with massive fluid overload and LE edema attributed to right sided heart failure.  He was recently hospitaliized for sepsis due to group G strep bacteremia from right lower extremity nonpurulent cellulitis.     Length of Stay: 11  Current Medications: Scheduled Meds:  . sodium chloride   Intravenous Once  . sodium chloride   Intravenous Once  . acetaminophen  1,000 mg Oral Q8H  . acetaZOLAMIDE  250 mg Oral BID  . [START ON 05/25/2019] lactulose  20 g Oral Daily  . lidocaine  1 patch Transdermal Q24H  . mouth rinse  15 mL Mouth Rinse BID  . pantoprazole  40 mg Oral BID  . rifaximin  400 mg Oral TID  . [START ON 05/25/2019] spironolactone  25 mg Oral Daily  . torsemide  100 mg Oral BID  . warfarin  7.5 mg Oral ONCE-1800  . Warfarin - Pharmacist Dosing Inpatient   Does not apply q1800    Continuous Infusions: .  heparin 1,300 Units/hr (05/23/19 2328)    PRN Meds: lip balm, oxyCODONE, polyethylene glycol, sodium chloride flush  Physical Exam      Well developed chronically ill appearing male, Awake A&O, very pleasant, sitting in recliner Quickly appeared fatigued.   Vital Signs: BP 115/63   Pulse 89   Temp 98 F (36.7 C) (Oral)   Resp 16   Ht 5\' 11"  (1.803 m)   Wt (!) 149.8 kg   SpO2 93%   BMI 46.06 kg/m  SpO2: SpO2: 93 % O2 Device: O2 Device: Room Air O2 Flow Rate: O2 Flow Rate (L/min): 2 L/min  Intake/output summary:   Intake/Output Summary (Last 24 hours) at 05/24/2019 1602 Last data filed at 05/24/2019 0600 Gross per 24 hour  Intake 1140.21 ml  Output 501 ml  Net 639.21 ml   LBM: Last BM Date: 05/23/19 Baseline Weight: Weight: (!) 157.4 kg Most recent weight: Weight: (!) 149.8 kg       Palliative Assessment/Data: 20 - 30%    Flowsheet Rows     Most Recent Value  Intake Tab  Referral Department  Hospitalist  Unit at Time of Referral  Cardiac/Telemetry Unit  Palliative Care Primary Diagnosis  Cardiac  Date Notified  05/16/19  Palliative Care Type  New Palliative care  Reason for referral  Clarify Goals of Care  Date of Admission  05/13/19  Date first seen by Palliative Care  05/18/19  # of days Palliative referral response time  2 Day(s)  # of days IP prior to Palliative referral  3  Clinical Assessment  Psychosocial & Spiritual Assessment  Palliative Care Outcomes      Patient Active Problem List   Diagnosis Date Noted  . Encephalopathy due to ammonia   . DNR (do not resuscitate)   . Slow transit constipation   .  Palliative care encounter   . Palliative care by specialist   . Anemia   . Heme positive stool   . Anticoagulated   . Acute renal failure superimposed on stage 3b chronic kidney disease ([place]) 05/14/2019  . Hyperkalemia 05/14/2019  . Acute on chronic right heart failure ([place])   . Status post biventricular pacemaker   . Acute CHF (congestive  heart failure) ([place]) 05/13/2019  . [place] 03/23/2019  . Hypotension 03/20/2019  . Group G streptococcal infection 03/19/2019  . Bacteremia 03/19/2019  . Sepsis ([place]) 03/17/2019  . UTI (urinary tract infection) 03/17/2019  . Acute respiratory failure with hypoxia ([place]) 03/17/2019  . Acute exacerbation of CHF (congestive heart failure) ([place]) 03/17/2019  . AKI (acute kidney injury) ([place]) 03/17/2019  . Change in bowel habits   . Rectal bleeding   . Pulmonary hypertension ([place]) 12/17/2017  . Complete heart block ([place]) 07/28/2017  . OSA (obstructive sleep apnea) 06/08/2017  . NICM (nonischemic cardiomyopathy) ([place]) 05/28/2017  . Chronic combined systolic and diastolic heart failure ([place]) 01/18/2017  . Dyspnea on exertion 07/12/2015  . Encounter for therapeutic drug monitoring 05/19/2013  . Cardiac pacemaker-[employer]ude 06/22/2011  . S/P mitral valve replacement-Mechanical 10/10/2010  . Atrial fibrillation/tachycardia 06/08/2010  . TIA (transient ischemic attack) 06/08/2010  . S/P aortic valve repair 06/08/2010  . Acute on chronic diastolic heart failure ([place]) 08/10/2009  . VENTRICULAR TACHYCARDIA 11/23/2008  . MORBID OBESITY 06/10/2007  . Essential hypertension 06/10/2007  . 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 DYSPNEA 06/10/2007  . CT, CHEST, ABNORMAL 06/10/2007  . MELANOMA, HX OF 06/10/2007    Palliative Care Plan    Recommendations/Plan:  DNR  Confirmed directly with patient.  Patient and wife want to transfer to [facility] as soon as a bed is available.  Patient and wife ask if oral lactulose, lasix, and coumadin (10 mg daily) can be continued at BP.  I will defer to BP MD.  Feel patient will decline rapidly now that milrinone is discontinued.  Wife could bring coumadin from home if that would be helpful.  Goals of Care and Additional Recommendations:  Limitations on Scope of Treatment: Do not escalate treatment.  No further invasive procedures.  Will  transition to full comfort on discharge from the hospital.  Code Status:  DNR  Prognosis:   < 2 weeks given end stage heart failure, open tricuspid valve, cardiac cirrhosis with recurrent encephalopathy, and recurrent infections.   Discharge Planning:  Hospice facility  Care plan was discussed with TRH MD, Patient, wife and RN  Thank you for allowing the Palliative Medicine Team to assist in the care of this patient.  Total time spent:  75 min. Time in 2:30 Time out 3:45     Greater than 50%  of this time was spent counseling and coordinating care related to the above assessment and plan.  Florentina Jenny, PA-C Palliative Medicine  Please contact Palliative MedicineTeam phone at [phone] for questions and concerns between 7 am - 7 pm.   Please see AMION for individual provider pager numbers.

## 2019-05-24 NOTE — Progress Notes (Signed)
Frank [name] for Coumadin / heparin  Indication: Atrial fibrillation and mechanical heart valve  Allergies  Allergen Reactions  . Warfarin And Related Other (See Comments)    COUMADIN or JANTOVEN BRAND ONLY** Per pt, was switched to generic in 2001 and had significantly decreased absorption > INR subtherapeutic> pt suffered a TIA and has been on brand name only since     Patient Measurements: Height: 5\' 11"  (180.3 cm) Weight: (!) 330 lb 4 oz (149.8 kg) IBW/kg (Calculated) : 75.3  Vital Signs: Temp: 98.4 F (36.9 C) (02/13 0406) Temp Source: Axillary (02/13 0406) BP: 107/66 (02/13 0410) Pulse Rate: 88 (02/13 0410)  Labs: Recent Labs    05/22/19 0428 05/23/19 0515 05/24/19 0400  HGB  --  7.5* 7.9*  HCT  --  25.7* 26.5*  PLT  --  261 240  LABPROT 24.4* 22.7* 24.5*  INR 2.2* 2.0* 2.2*  HEPARINUNFRC 0.47 0.41 0.39  CREATININE 1.90* 1.95* 2.16*    Estimated Creatinine Clearance: 46.6 mL/min (A) (by C-G formula based on SCr of 2.16 mg/dL (H)).    Assessment: 88 yoM on warfarin PTA for AF and mMVR admitted with CHF. Warfarin held for possible procedures and vitamin K given earlier in admit with supratherapeutic INR on admit. Heparin remains therapeutic, INR 2.0, CBC stable. Plan to restart warfarin today in anticipation of discharge Monday. Home dose below but noted that pt has poor appetite and INR was elevated on admit so will be cautious in restarting. -INR 2.0>>2.2, hg= 7.9  *Home dose = 10mg  daily except 15mg  Wed  Goal of Therapy:  Heparin level: 0.3-0.5 INR 2.5-3.5 Monitor platelets by anticoagulation protocol: Yes   Plan:  -Heparin 1300 units/h -Warfarin 7.5mg  PO x1 -Daily INR, CBC, heparin level   Hildred Laser, PharmD Clinical Pharmacist **Pharmacist phone directory can now be found on [email or web address] ([email or web address]).  Listed under [place].

## 2019-05-24 NOTE — Progress Notes (Signed)
PROGRESS NOTE  Frank [name] BHA:[id number] DOB: 06-Mar-1946   PCP: [name], MD  Patient is from: Home.  Lives with his wife.  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 Fib, lymphedema, CKD-4, debility, morbid obesity, recurrent hospitalization for sepsis secondary to Streptococcus from RLE cellulitis presenting with progressive dyspnea and weight gain.   In ED, hemodynamically stable except for brief tachycardia to 124.  Saturation 98% on room air. CXR consistent with CHF.  K7.0. Cr 2.67 (baseline~1.8).  INR 7.2.  Hgb 7.2 (baseline about 8.0).  EKG revealed paced rhythm.  COVID-19 negative.  Received IV Lasix 120 mg once and was started on Lasix drip by cardiologist.  Also received bicarb, IV insulin and D50, and admitted for CHF exacerbation, AKI, hyperkalemia, supratherapeutic INR and anemia.  Cardiology consulted and started on Lasix drip.  Hypokalemia resolved with Lokelma and IV Lasix.  INR improved after vitamin K.  Hemoglobin dropped requiring intermittent blood transfusions.  FOBT positive.  GI consulted and not planning EGD given his cardiac comorbidity unless signs of profuse bleeding.  Anticoagulation resumed with heparin drip.  Palliative care consulted and met with patient and wife on 2/9.  CODE STATUS changed to DNR/DNI.  Patient failed to make significant progress clinically despite good diuresis and improvement in most of his numbers.  After long discussion between cardiology, wife and patient, the consensus was to transition to oral torsemide 100 mg twice daily, restart warfarin and transferred to residential hospice on 05/26/2019.   Patient has been accepted to [facility] but no bed available yet.  TOC consulted by palliative care to explore other options.  Subjective: No major events overnight or this morning.  Minimal urine output on oral  torsemide.  Renal function worse.  He denies pain or dyspnea but tired.  Wife at bedside.   Objective: Vitals:   05/23/19 1845 05/23/19 2052 05/24/19 0406 05/24/19 0410  BP: 100/81 (!) 101/50  107/66  Pulse:  89  88  Resp: _0 Temp:  98.7 F (37.1 C) 98.4 F (36.9 C)   TempSrc:  Oral Axillary   SpO2:  95%  97%  Weight:   (!) 149.8 kg   Height:        Intake/Output Summary (Last 24 hours) at 05/24/2019 1129 Last data filed at 05/24/2019 0600 Gross per 24 hour  Intake 1240.21 ml  Output 501 ml  Net 739.21 ml   Filed Weights   05/22/19 0630 05/23/19 0614 05/24/19 0406  Weight: (!) 149.9 kg (!) 148.4 kg (!) 149.8 kg    Examination:  GENERAL: Chronically ill-appearing.  Lethargic. HEENT: MMM.  Vision and hearing grossly intact.  RESP: Some increased work of breathing.  Fair aeration bilaterally. CVS:  RRR.  2/6 SEM over RUSB and LUSB.  Mechanical valve sounds. ABD/GI/GU: Bowel sounds present. Soft. Non tender.  MSK/EXT: 2+ edema bilaterally.  Ace wrap over bilateral lower extremities. SKIN: no apparent skin lesion or wound NEURO: Somewhat drowsy but awakes to voice easily responds to question appropriately.  No apparent focal neuro deficits PSYCH: Very lethargic.  No acute distress.   Procedures:  None  Assessment & Plan: Acute on chronic diastolic CHF: Echo in 97/35 with EF of 55 to 60%, moderate LVH, moderate LAE and severe RAE, moderate to severe TVR.  Had significant fluid overload with massive lymphedema.  Aggressively diuresed with Lasix drip, milrinone and Diamox.  Now minimal urine output on torsemide after stopping milrinone.  Renal function worse.  Overall, patient is in decline. -Advanced HF team managing -Monitor fluid status, renal function and electrolytes -Plan for transfer to residential hospice-doubt utility of cardiac meds at this point.  Acute metabolic encephalopathy likely due to hepatic encephalopathy:  Recent ABG reassuring.Ammonia 110> > 84>  89.  -Continue lactulose to 30 g 3 times daily  AKI on CKD-4/azotemia: Baseline  Cr 1.8-2.0 > 2.67 (admit) >> 2.84>> 1.78>> 2.16.  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 -Continue monitoring.  Acute on chronic blood loss anemia in in the setting of supratherapeutic INR: Hgb ~8.0 (baseline)> 7.2 (admit)>> 6.5>>4u>7.0>> 7.2>> 7.9.   INR 7.6>>2.0> 2.2. FOBT positive.  Elevated LDH and low haptoglobin concerning for some element of valvular hemolysis.  H&H low but seems to be stable now. -GI signed off. -Warfarin and heparin resumed-H&H stable.  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  Not a candidate for surgery. Supratherapeutic INR: INR 7.2> 7.6> vitamin K>> 4.8> 2.2 -Warfarin resumed.  Still on heparin bridge.   Paroxysmal atrial fibrillation: NSR.  Not on rate or rhythm control. -Anticoagulation as above  Hyponatremia: Likely due to renal failure.  Resolved.  Hypokalemia: Resolved. -Reduced Aldactone to 25 mg daily. -Continue monitoring  At risk for sleep apnea/OHS: Body mass index is 46.06 kg/m.  Refuses nightly CPAP.  Recent ABG reassuring. -Discontinued CPAP.  Group G Streptococcus bacteremia from RLE cellulitis in patient with lymphedema -Completed antibiotic course with penicillin G followed by oral Zyvox for a total of 14 days through 12/22 followed by oral Augmentin and amoxicillin through 2/9.   Left chest wall pain: Resolved.  Generalized weakness/debility -PT/OT  Lymphedema/possible right LE cellulitis:  -Diuretics as above. -Appreciate WOC  Morbid obesity: Body mass index is 46.06 kg/m. -Encourage lifestyle change to lose weight  Goal of care/DNR/DNI: Patient is full code.  Significant comorbidities as above poor prognosis.  Entertain palliative care consult, and was interested.  Palliative care consulted and met with patient and family.   -2/9-DNR/DNI  -12/12-Residential hospice on 2/15. May be sooner if  bed is available since patient is declining.   -PMT and TOC following.         DVT prophylaxis: On heparin and warfarin. Code Status: DNR/DNI Family Communication: Updated patient's wife at bedside.  Discharge barrier: Residential hospice. Patient is from: Home Final disposition: Residential hospice.   Consultants: Cardiology   Microbiology summarized: [id number] and influenza PCR negative. Blood cultures negative so far  Sch Meds:  Scheduled Meds:  sodium chloride   Intravenous Once   sodium chloride   Intravenous Once   acetaminophen  1,000 mg Oral Q8H   acetaZOLAMIDE  250 mg Oral BID   lactulose  20 g Oral TID   lidocaine  1 patch Transdermal Q24H   mouth rinse  15 mL Mouth Rinse BID   pantoprazole  40 mg Oral BID   rifaximin  400 mg Oral TID   spironolactone  100 mg Oral Daily   torsemide  100 mg Oral BID   warfarin  7.5 mg Oral ONCE-1800   Warfarin - Pharmacist Dosing Inpatient   Does not apply q1800   Continuous Infusions:  heparin 1,300 Units/hr (05/23/19 2328)   PRN Meds:.lip balm, oxyCODONE, polyethylene glycol, sodium chloride flush  Antimicrobials: Anti-infectives (From admission, onward)   Start     Dose/Rate Route Frequency Ordered Stop   05/23/19 1000  rifaximin (XIFAXAN) tablet 400  mg     400 mg Oral 3 times daily 05/23/19 0916     05/14/19 1700  amoxicillin (AMOXIL) capsule 500 mg  Status:  Discontinued     500 mg Oral Every 8 hours 05/14/19 1620 05/20/19 1359       I have personally reviewed the following labs and images: CBC: Recent Labs  Lab 05/19/19 0500 05/20/19 0343 05/21/19 0500 05/23/19 0515 05/24/19 0400  WBC 7.9 7.0 6.4 7.3 10.8*  HGB 7.3* 7.2* 7.4* 7.5* 7.9*  HCT 23.5* 23.4* 24.7* 25.7* 26.5*  MCV 96.3 97.1 100.4* 102.8* 101.1*  PLT 260 257 265 261 240   BMP &GFR Recent Labs  Lab 05/20/19 0343 05/20/19 1350 05/21/19 0500 05/21/19 0500 05/21/19 1720 05/22/19 0428 05/22/19 1820 05/23/19 0515  05/24/19 0400  NA 139   < > 138  --  138 138  --  137 139  K 2.3*   < > 2.6*   < > 3.2* 3.2* 3.2* 3.2* 3.8  CL 95*   < > 94*  --  93* 93*  --  94* 95*  CO2 31   < > 32  --  32 32  --  31 32  GLUCOSE 108*   < > 104*  --  119* 112*  --  108* 97  BUN 62*   < > 54*  --  51* 51*  --  46* 51*  CREATININE 1.78*   < > 2.00*  --  1.95* 1.90*  --  1.95* 2.16*  CALCIUM 8.6*   < > 8.6*  --  8.7* 8.8*  --  8.9 9.2  MG 2.2  --  2.1  --   --  2.2  --  2.2 2.3  PHOS 4.1  --  3.7  --   --  2.9  --  3.6 4.2   < > = values in this interval not displayed.   Estimated Creatinine Clearance: 46.6 mL/min (A) (by C-G formula based on SCr of 2.16 mg/dL (H)). Liver & Pancreas: Recent Labs  Lab 05/21/19 0500 05/21/19 1720 05/22/19 0428 05/23/19 0515 05/24/19 0400  AST  --  46*  --   --   --   ALT  --  16  --   --   --   ALKPHOS  --  105  --   --   --   BILITOT  --  1.2  --   --   --   PROT  --  7.0  --   --   --   ALBUMIN 2.4* 2.4* 2.5* 2.5* 2.5*   No results for input(s): LIPASE, AMYLASE in the last 168 hours. Recent Labs  Lab 05/18/19 1641 05/20/19 0343 05/21/19 0500 05/23/19 0515 05/24/19 1045  AMMONIA 110* 96* 92* 84* 89*   Diabetic: No results for input(s): HGBA1C in the last 72 hours. No results for input(s): GLUCAP in the last 168 hours. Cardiac Enzymes: No results for input(s): CKTOTAL, CKMB, CKMBINDEX, TROPONINI in the last 168 hours. No results for input(s): PROBNP in the last 8760 hours. Coagulation Profile: Recent Labs  Lab 05/20/19 0343 05/21/19 0500 05/22/19 0428 05/23/19 0515 05/24/19 0400  INR 2.2* 2.2* 2.2* 2.0* 2.2*   Thyroid Function Tests: No results for input(s): TSH, T4TOTAL, FREET4, T3FREE, THYROIDAB in the last 72 hours. Lipid Profile: No results for input(s): CHOL, HDL, LDLCALC, TRIG, CHOLHDL, LDLDIRECT in the last 72 hours. Anemia Panel: No results for input(s): VITAMINB12, FOLATE, FERRITIN, TIBC, IRON, RETICCTPCT in the last  72 hours. Urine analysis:     Component Value Date/Time   COLORURINE YELLOW 03/17/2019 0054   APPEARANCEUR CLOUDY (A) 03/17/2019 0054   LABSPEC 1.006 03/17/2019 0054   PHURINE 6.0 03/17/2019 0054   GLUCOSEU NEGATIVE 03/17/2019 0054   HGBUR SMALL (A) 03/17/2019 0054   BILIRUBINUR NEGATIVE 03/17/2019 0054   KETONESUR NEGATIVE 03/17/2019 0054   PROTEINUR 30 (A) 03/17/2019 0054   NITRITE NEGATIVE 03/17/2019 0054   LEUKOCYTESUR LARGE (A) 03/17/2019 0054   Sepsis Labs: Invalid input(s): PROCALCITONIN, [place]  Microbiology: Recent Results (from the past 240 hour(s))  Culture, blood (routine x 2)     Status: None   Collection Time: 05/14/19  2:28 PM   Specimen: BLOOD RIGHT HAND  Result Value Ref Range Status   Specimen Description BLOOD RIGHT HAND  Final   Special Requests   Final    BOTTLES DRAWN AEROBIC ONLY Blood Culture adequate volume   Culture   Final    NO GROWTH 9 DAYS Performed at [facility], [address], [place], [place] [address]    Report Status 05/23/2019 FINAL  Final  Culture, blood (routine x 2)     Status: None   Collection Time: 05/14/19  2:34 PM   Specimen: BLOOD LEFT HAND  Result Value Ref Range Status   Specimen Description BLOOD LEFT HAND  Final   Special Requests   Final    BOTTLES DRAWN AEROBIC ONLY Blood Culture adequate volume   Culture   Final    NO GROWTH 9 DAYS Performed at [facility], [place] [address] [place], [place] [address]    Report Status 05/23/2019 FINAL  Final    Radiology Studies: No results found.  Damyia Strider T. [place]  If 7PM-7AM, please contact night-coverage [email or web address] Password [facility] 05/24/2019, 11:29 AM

## 2019-05-25 DIAGNOSIS — I071 Rheumatic tricuspid insufficiency: Secondary | ICD-10-CM

## 2019-05-25 LAB — CBC
HCT: 26.5 % — ABNORMAL LOW (ref 39.0–52.0)
Hemoglobin: 7.8 g/dL — ABNORMAL LOW (ref 13.0–17.0)
MCH: 30.1 pg (ref 26.0–34.0)
MCHC: 29.4 g/dL — ABNORMAL LOW (ref 30.0–36.0)
MCV: 102.3 fL — ABNORMAL HIGH (ref 80.0–100.0)
Platelets: 225 10*3/uL (ref 150–400)
RBC: 2.59 MIL/uL — ABNORMAL LOW (ref 4.22–5.81)
RDW: 21.3 % — ABNORMAL HIGH (ref 11.5–15.5)
WBC: 6.1 10*3/uL (ref 4.0–10.5)
nRBC: 0.5 % — ABNORMAL HIGH (ref 0.0–0.2)

## 2019-05-25 LAB — RENAL FUNCTION PANEL
Albumin: 2.4 g/dL — ABNORMAL LOW (ref 3.5–5.0)
Anion gap: 12 (ref 5–15)
BUN: 52 mg/dL — ABNORMAL HIGH (ref 8–23)
CO2: 32 mmol/L (ref 22–32)
Calcium: 9.2 mg/dL (ref 8.9–10.3)
Chloride: 94 mmol/L — ABNORMAL LOW (ref 98–111)
Creatinine, Ser: 2.38 mg/dL — ABNORMAL HIGH (ref 0.61–1.24)
GFR calc Af Amer: 31 mL/min — ABNORMAL LOW (ref >60)
GFR calc non Af Amer: 26 mL/min — ABNORMAL LOW (ref >60)
Glucose, Bld: 82 mg/dL (ref 70–99)
Phosphorus: 4.6 mg/dL (ref 2.5–4.6)
Potassium: 3.6 mmol/L (ref 3.5–5.1)
Sodium: 138 mmol/L (ref 135–145)

## 2019-05-25 LAB — AMMONIA: Ammonia: 101 umol/L — ABNORMAL HIGH (ref 9–35)

## 2019-05-25 LAB — PROTIME-INR
INR: 2.5 — ABNORMAL HIGH (ref 0.8–1.2)
Prothrombin Time: 27 seconds — ABNORMAL HIGH (ref 11.4–15.2)

## 2019-05-25 LAB — HEPARIN LEVEL (UNFRACTIONATED): Heparin Unfractionated: 0.32 IU/mL (ref 0.30–0.70)

## 2019-05-25 LAB — MAGNESIUM: Magnesium: 2.4 mg/dL (ref 1.7–2.4)

## 2019-05-25 MED ORDER — LACTULOSE 10 GM/15ML PO SOLN
20.0000 g | Freq: Three times a day (TID) | ORAL | Status: DC
  Administered 2019-05-25 – 2019-05-26 (×3): 20 g via ORAL
  Filled 2019-05-25 (×3): qty 30

## 2019-05-25 MED ORDER — WARFARIN SODIUM 7.5 MG PO TABS
7.5000 mg | ORAL_TABLET | Freq: Once | ORAL | Status: AC
  Administered 2019-05-25: 17:00:00 7.5 mg via ORAL
  Filled 2019-05-25: qty 1

## 2019-05-25 NOTE — Progress Notes (Signed)
[place] for Coumadin / heparin  Indication: Atrial fibrillation and mechanical heart valve  Allergies  Allergen Reactions  . Warfarin And Related Other (See Comments)    COUMADIN or JANTOVEN BRAND ONLY** Per pt, was switched to generic in 2001 and had significantly decreased absorption > INR subtherapeutic> pt suffered a TIA and has been on brand name only since     Patient Measurements: Height: 5\' 11"  (180.3 cm) Weight: (!) 328 lb 0.7 oz (148.8 kg) IBW/kg (Calculated) : 75.3  Vital Signs: Temp: 98 F (36.7 C) (02/14 0352) Temp Source: Oral (02/14 0352) BP: 124/58 (02/14 0352) Pulse Rate: 83 (02/14 0352)  Labs: Recent Labs    05/23/19 0515 05/23/19 0515 05/24/19 0400 05/25/19 0513  HGB 7.5*   < > 7.9* 7.8*  HCT 25.7*  --  26.5* 26.5*  PLT 261  --  240 225  LABPROT 22.7*  --  24.5* 27.0*  INR 2.0*  --  2.2* 2.5*  HEPARINUNFRC 0.41  --  0.39 0.32  CREATININE 1.95*  --  2.16* 2.38*   < > = values in this interval not displayed.    Estimated Creatinine Clearance: 42.2 mL/min (A) (by C-G formula based on SCr of 2.38 mg/dL (H)).    Assessment: 43 yoM on warfarin PTA for AF and mMVR admitted with CHF. Warfarin held for possible procedures and vitamin K given earlier in admit with supratherapeutic INR on admit. Heparin remains therapeutic, INR 2.0, CBC stable. Plan to restart warfarin today in anticipation of discharge Monday. Home dose below but noted that pt has poor appetite and INR was elevated on admit so will be cautious in restarting. -INR 2.0>>2.2>>2.5, hg= 7.8  *Home dose = 10mg  daily except 15mg  Wed  Goal of Therapy:  Heparin level: 0.3-0.5 INR 2.5-3.5 Monitor platelets by anticoagulation protocol: Yes   Plan:  -Heparin 1300 units/hr; consider discontinuing today -Warfarin 7.5mg  PO x1 -Daily INR, CBC, heparin level -At discharge wound consider warfarin 7.5mg  po daily with close INR monitoring   Hildred Laser,  PharmD Clinical Pharmacist **Pharmacist phone directory can now be found on [email or web address] ([email or web address]).  Listed under [place].

## 2019-05-25 NOTE — Care Management (Signed)
Per Anderson Malta with [facility], patient can transfer to [facility] tomorrow morning.

## 2019-05-25 NOTE — Progress Notes (Signed)
PROGRESS NOTE  Frank [name] STM:[id number] DOB: May 18, 1945   PCP: [name], MD  Patient is from: Home.  Lives with his wife.  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 Fib, lymphedema, CKD-4, debility, morbid obesity, recurrent hospitalization for sepsis secondary to Streptococcus from RLE cellulitis presenting with progressive dyspnea and weight gain.   In ED, hemodynamically stable except for brief tachycardia to 124.  Saturation 98% on room air. CXR consistent with CHF.  K7.0. Cr 2.67 (baseline~1.8).  INR 7.2.  Hgb 7.2 (baseline about 8.0).  EKG revealed paced rhythm.  COVID-19 negative.  Received IV Lasix 120 mg once and was started on Lasix drip by cardiologist.  Also received bicarb, IV insulin and D50, and admitted for CHF exacerbation, AKI, hyperkalemia, supratherapeutic INR and anemia.  Cardiology consulted and started on Lasix drip.  Hypokalemia resolved with Lokelma and IV Lasix.  INR improved after vitamin K.  Hemoglobin dropped requiring intermittent blood transfusions.  FOBT positive.  GI consulted and not planning EGD given his cardiac comorbidity unless signs of profuse bleeding.  Anticoagulation resumed with heparin drip.  Palliative care consulted and met with patient and wife on 2/9.  CODE STATUS changed to DNR/DNI.  Patient failed to make significant progress clinically despite good diuresis and improvement in most of his numbers.  After long discussion between cardiology, wife and patient, the consensus was to transition to oral torsemide 100 mg twice daily, restart warfarin and transferred to residential hospice on 05/26/2019.   Patient has been accepted to [facility] but no bed available yet.  TOC consulted by palliative care to explore other options.  Assessment & Plan: Acute on chronic diastolic CHF: Echo in 89/21 with EF of 55 to 60%,  moderate LVH, moderate LAE and severe RAE, moderate to severe TVR.  Had significant fluid overload with massive lymphedema.  Aggressively diuresed with Lasix drip, milrinone and Diamox.  Now minimal urine output on torsemide after stopping milrinone.  Renal function worse.  Overall, patient is in decline. -Advanced HF team managing -Monitor fluid status, renal function and electrolytes -Plan for transfer to residential hospice-doubt utility of cardiac meds at this point.  Acute metabolic encephalopathy likely due to hepatic encephalopathy: Patient alert and oriented however he looks tired.  Per patient and wife, he has not had any bowel movement in last 24 hours.  Ammonia rising slowly.  Will increase frequency of his lactulose to 20 mg 3 times daily.  AKI on CKD-4/azotemia: Baseline  Cr 1.8-2.0 > 2.67 (admit) >> 2.84>> 1.78>> 2.16> 2.38.  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  Continues to be on diuretics.  Acute on chronic blood loss anemia in in the setting of supratherapeutic INR: Hgb ~8.0 (baseline)> 7.2 (admit)>> 6.5>>4u>7.0>> 7.2>> 7.9> 7.8.   INR 7.6>>2.0> 2.2. FOBT positive.  Elevated LDH and low haptoglobin concerning for some element of valvular hemolysis.  H&H low but seems to be stable now. -GI signed off. INR therapeutic.  Continue Coumadin.  Discontinue heparin.  Mechanical MVR/AVR: Therapeutic INR today.  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  Not a candidate for surgery. Supratherapeutic INR: INR 7.2> 7.6> vitamin K>> 4.8> 2.2> 2.  Paroxysmal atrial fibrillation: NSR.  Not on rate or rhythm control. -Anticoagulation as above  Hyponatremia: Likely due to renal failure.  Resolved.  Hypokalemia: Resolved. -Reduced Aldactone to 25 mg daily. -Continue monitoring  At risk for sleep apnea/OHS:  Body mass index is 45.75 kg/m.  Refuses nightly CPAP.  Recent ABG reassuring. -Discontinued CPAP.  Group G Streptococcus bacteremia from RLE cellulitis in patient with  lymphedema -Completed antibiotic course with penicillin G followed by oral Zyvox for a total of 14 days through 12/22 followed by oral Augmentin and amoxicillin through 2/9.   Left chest wall pain: Resolved.  Generalized weakness/debility -PT/OT  Lymphedema/possible right LE cellulitis:  -Diuretics as above. -Appreciate WOC  Morbid obesity: Body mass index is 45.75 kg/m. -Encourage lifestyle change to lose weight  Goal of care: Significant comorbidities as above poor prognosis.  Appreciate PMD involvement.  Family has agreed for hospice home.  Will potentially have a bed at [facility] tomorrow and will be discharged.   DVT prophylaxis: Coumadin Code Status: DNR/DNI Family Communication: Patient's wife at the bedside.  Long discussion.  Discharge barrier: Residential hospice bed. Patient is from: Home Final disposition: [facility] (life sciences).  Consultants: Cardiology   Subjective: Seen and examined.  Wife at the bedside.  Patient has no complaints.  Objective: Vitals:   05/25/19 0352 05/25/19 0500 05/25/19 1000 05/25/19 1300  BP: (!) 124/58   123/65  Pulse: 83  86 87  Resp: 12  15 15   Temp: 98 F (36.7 C)   98 F (36.7 C)  TempSrc: Oral   Oral  SpO2: 93%  99% 95%  Weight:  (!) 148.8 kg    Height:        Intake/Output Summary (Last 24 hours) at 05/25/2019 1349 Last data filed at 05/25/2019 1323 Gross per 24 hour  Intake 1003.99 ml  Output 1575 ml  Net -571.01 ml   Filed Weights   05/23/19 0614 05/24/19 0406 05/25/19 0500  Weight: (!) 148.4 kg (!) 149.8 kg (!) 148.8 kg    Examination:  General exam: Appears calm and comfortable  Respiratory system: Clear to auscultation. Respiratory effort normal. Cardiovascular system: S1 & S2 heard, RRR. No JVD, murmurs, rubs, gallops or clicks.  +1 pitting edema bilateral lower extremity Gastrointestinal system: Abdomen is nondistended, soft and nontender. No organomegaly or masses felt. Normal bowel sounds heard.  Central nervous system: Alert and oriented. No focal neurological deficits. Extremities: Symmetric 5 x 5 power. Skin: No rashes, lesions or ulcers.    Procedures:  None  Microbiology summarized: [id number] and influenza PCR negative. Blood cultures negative so far  Sch Meds:  Scheduled Meds: . sodium chloride   Intravenous Once  . sodium chloride   Intravenous Once  . acetaminophen  1,000 mg Oral Q8H  . acetaZOLAMIDE  250 mg Oral BID  . lactulose  20 g Oral BID  . lidocaine  1 patch Transdermal Q24H  . mouth rinse  15 mL Mouth Rinse BID  . pantoprazole  40 mg Oral BID  . spironolactone  25 mg Oral Daily  . torsemide  100 mg Oral BID  . warfarin  7.5 mg Oral ONCE-1800  . Warfarin - Pharmacist Dosing Inpatient   Does not apply q1800   Continuous Infusions: . heparin 1,300 Units/hr (05/25/19 1314)   PRN Meds:.lip balm, oxyCODONE, polyethylene glycol, sodium chloride flush  Antimicrobials: Anti-infectives (From admission, onward)   Start     Dose/Rate Route Frequency Ordered Stop   05/23/19 1000  rifaximin (XIFAXAN) tablet 400 mg  Status:  Discontinued     400 mg Oral 3 times daily 05/23/19 0916 05/24/19 1603   05/14/19 1700  amoxicillin (AMOXIL) capsule 500 mg  Status:  Discontinued     500 mg  Oral Every 8 hours 05/14/19 1620 05/20/19 1359       I have personally reviewed the following labs and images: CBC: Recent Labs  Lab 05/20/19 0343 05/21/19 0500 05/23/19 0515 05/24/19 0400 05/25/19 0513  WBC 7.0 6.4 7.3 10.8* 6.1  HGB 7.2* 7.4* 7.5* 7.9* 7.8*  HCT 23.4* 24.7* 25.7* 26.5* 26.5*  MCV 97.1 100.4* 102.8* 101.1* 102.3*  PLT 257 265 261 240 225   BMP &GFR Recent Labs  Lab 05/21/19 0500 05/21/19 0500 05/21/19 1720 05/21/19 1720 05/22/19 0428 05/22/19 1820 05/23/19 0515 05/24/19 0400 05/25/19 0513  NA 138   < > 138  --  138  --  137 139 138  K 2.6*   < > 3.2*   < > 3.2* 3.2* 3.2* 3.8 3.6  CL 94*   < > 93*  --  93*  --  94* 95* 94*  CO2 32   < > 32  --   32  --  31 32 32  GLUCOSE 104*   < > 119*  --  112*  --  108* 97 82  BUN 54*   < > 51*  --  51*  --  46* 51* 52*  CREATININE 2.00*   < > 1.95*  --  1.90*  --  1.95* 2.16* 2.38*  CALCIUM 8.6*   < > 8.7*  --  8.8*  --  8.9 9.2 9.2  MG 2.1  --   --   --  2.2  --  2.2 2.3 2.4  PHOS 3.7  --   --   --  2.9  --  3.6 4.2 4.6   < > = values in this interval not displayed.   Estimated Creatinine Clearance: 42.2 mL/min (A) (by C-G formula based on SCr of 2.38 mg/dL (H)). Liver & Pancreas: Recent Labs  Lab 05/21/19 1720 05/22/19 0428 05/23/19 0515 05/24/19 0400 05/25/19 0513  AST 46*  --   --   --   --   ALT 16  --   --   --   --   ALKPHOS 105  --   --   --   --   BILITOT 1.2  --   --   --   --   PROT 7.0  --   --   --   --   ALBUMIN 2.4* 2.5* 2.5* 2.5* 2.4*   No results for input(s): LIPASE, AMYLASE in the last 168 hours. Recent Labs  Lab 05/20/19 0343 05/21/19 0500 05/23/19 0515 05/24/19 1045 05/25/19 0513  AMMONIA 96* 92* 84* 89* 101*   Diabetic: No results for input(s): HGBA1C in the last 72 hours. No results for input(s): GLUCAP in the last 168 hours. Cardiac Enzymes: No results for input(s): CKTOTAL, CKMB, CKMBINDEX, TROPONINI in the last 168 hours. No results for input(s): PROBNP in the last 8760 hours. Coagulation Profile: Recent Labs  Lab 05/21/19 0500 05/22/19 0428 05/23/19 0515 05/24/19 0400 05/25/19 0513  INR 2.2* 2.2* 2.0* 2.2* 2.5*   Thyroid Function Tests: No results for input(s): TSH, T4TOTAL, FREET4, T3FREE, THYROIDAB in the last 72 hours. Lipid Profile: No results for input(s): CHOL, HDL, LDLCALC, TRIG, CHOLHDL, LDLDIRECT in the last 72 hours. Anemia Panel: No results for input(s): VITAMINB12, FOLATE, FERRITIN, TIBC, IRON, RETICCTPCT in the last 72 hours. Urine analysis:    Component Value Date/Time   COLORURINE YELLOW 03/17/2019 0054   APPEARANCEUR CLOUDY (A) 03/17/2019 0054   LABSPEC 1.006 03/17/2019 0054   PHURINE 6.0 03/17/2019 0054  GLUCOSEU NEGATIVE 03/17/2019 0054   HGBUR SMALL (A) 03/17/2019 0054   BILIRUBINUR NEGATIVE 03/17/2019 [place] 03/17/2019 0054   PROTEINUR 30 (A) 03/17/2019 0054   NITRITE NEGATIVE 03/17/2019 0054   LEUKOCYTESUR LARGE (A) 03/17/2019 0054   Sepsis Labs: Invalid input(s): PROCALCITONIN, [place]  Microbiology: No results found for this or any previous visit (from the past 240 hour(s)).  Radiology Studies: No results found.  Darliss Cheney, MD [facility]  If 7PM-7AM, please contact night-coverage [email or web address] 05/25/2019, 1:49 PM

## 2019-05-25 NOTE — Progress Notes (Signed)
Daily Progress Note   Patient Name: Frank [name]       Date: 05/25/2019 DOB: 1945/08/08  Age: 74 y.o. MRN#: [id number] Attending Physician: Darliss Cheney, MD Primary Care Physician: [name], MD Admit Date: 05/13/2019  Reason for Consultation/Follow-up: Establishing goals of care and Psychosocial/spiritual support  Subjective: Audrie Gallus and Francee Piccolo at bedside.  Oddie is sleeping comfortably.  He does wake when prompted and then slips back to sleep. He had a difficult night due to mishaps with the suction catheter.  He and [name] are relieved they are supposed to discharge to [facility].  Fortunately [name] has spoken to several friends who have experienced [facility] and they have offered her reassurance.    We will continue labs tomorrow am.  [name] wants to get one final reading to be able to follow any trends in creatinine and INR.   She tells me Wesam is taking the lactulose, but has not really had a significant BM today.    He had some discomfort today from laying in bed for a long period.  Getting up to chair and tylenol relieved his pain.  Martino wakes.  He denies pain.  He doesn't feel he needs any additional medication for sleep.  Per [name] he has enjoyed several phone calls today with family and friends.  [name] and I talked a bit more about ensuring [place]'s affairs are in order.   Assessment: Patient comfortable.  Creatinine trending up.  He appears to be more sleepy today.   Patient Profile/HPI:  74 y.o. male  with past medical history of chronic heart failure with preserved ejection fraction, HOCM s/p myomectomy, valvular disease with MVR, atrial fibrillation, pacemaker placement, CKD 4, and morbid obesity who was admitted on 05/13/2019 with massive fluid overload  and LE edema attributed to right sided heart failure.  He was recently hospitaliized for sepsis due to group G strep bacteremia from right lower extremity nonpurulent cellulitis.      Length of Stay: 12  Current Medications: Scheduled Meds:  . sodium chloride   Intravenous Once  . sodium chloride   Intravenous Once  . acetaminophen  1,000 mg Oral Q8H  . acetaZOLAMIDE  250 mg Oral BID  . lactulose  20 g Oral TID  . lidocaine  1 patch Transdermal Q24H  .  mouth rinse  15 mL Mouth Rinse BID  . pantoprazole  40 mg Oral BID  . spironolactone  25 mg Oral Daily  . torsemide  100 mg Oral BID  . warfarin  7.5 mg Oral ONCE-1800  . Warfarin - Pharmacist Dosing Inpatient   Does not apply q1800    Continuous Infusions:   PRN Meds: lip balm, oxyCODONE, polyethylene glycol, sodium chloride flush  Physical Exam       Well developed very pleasant lethargic gentleman.  Appears chronically ill.  +Pallor CV rrr with 3/6 murmur Resp no distress on 2L Abdomen soft, nt, nd   Vital Signs: BP 123/65 (BP Location: Left Arm)   Pulse 87   Temp 98 F (36.7 C) (Oral)   Resp 15   Ht 5\' 11"  (1.803 m)   Wt (!) 148.8 kg   SpO2 95%   BMI 45.75 kg/m  SpO2: SpO2: 95 % O2 Device: O2 Device: Room Air O2 Flow Rate: O2 Flow Rate (L/min): 2 L/min  Intake/output summary:   Intake/Output Summary (Last 24 hours) at 05/25/2019 1700 Last data filed at 05/25/2019 1527 Gross per 24 hour  Intake 674.79 ml  Output 1575 ml  Net -900.21 ml   LBM: Last BM Date: 05/23/19 Baseline Weight: Weight: (!) 157.4 kg Most recent weight: Weight: (!) 148.8 kg       Palliative Assessment/Data: 20%    Flowsheet Rows     Most Recent Value  Intake Tab  Referral Department  Hospitalist  Unit at Time of Referral  Cardiac/Telemetry Unit  Palliative Care Primary Diagnosis  Cardiac  Date Notified  05/16/19  Palliative Care Type  New Palliative care  Reason for referral  Clarify Goals of Care  Date of Admission   05/13/19  Date first seen by Palliative Care  05/18/19  # of days Palliative referral response time  2 Day(s)  # of days IP prior to Palliative referral  3  Clinical Assessment  Psychosocial & Spiritual Assessment  Palliative Care Outcomes      Patient Active Problem List   Diagnosis Date Noted  . Encephalopathy due to ammonia   . DNR (do not resuscitate)   . Slow transit constipation   . Palliative care encounter   . Palliative care by specialist   . Anemia   . Heme positive stool   . Anticoagulated   . Acute renal failure superimposed on stage 3b chronic kidney disease ([place]) 05/14/2019  . Hyperkalemia 05/14/2019  . Acute on chronic right heart failure ([place])   . Status post biventricular pacemaker   . Acute CHF (congestive heart failure) ([place]) 05/13/2019  . [place] 03/23/2019  . Hypotension 03/20/2019  . Group G streptococcal infection 03/19/2019  . Bacteremia 03/19/2019  . Sepsis ([place]) 03/17/2019  . UTI (urinary tract infection) 03/17/2019  . Acute respiratory failure with hypoxia ([place]) 03/17/2019  . Acute exacerbation of CHF (congestive heart failure) ([place]) 03/17/2019  . AKI (acute kidney injury) ([place]) 03/17/2019  . Change in bowel habits   . Rectal bleeding   . Pulmonary hypertension ([place]) 12/17/2017  . Complete heart block ([place]) 07/28/2017  . OSA (obstructive sleep apnea) 06/08/2017  . NICM (nonischemic cardiomyopathy) ([place]) 05/28/2017  . Chronic combined systolic and diastolic heart failure ([place]) 01/18/2017  . Dyspnea on exertion 07/12/2015  . Encounter for therapeutic drug monitoring 05/19/2013  . Cardiac pacemaker-[employer]ude 06/22/2011  . S/P mitral valve replacement-Mechanical 10/10/2010  . Atrial fibrillation/tachycardia 06/08/2010  . TIA (transient ischemic attack) 06/08/2010  . S/P  aortic valve repair 06/08/2010  . Acute on chronic diastolic heart failure ([place]) 08/10/2009  . VENTRICULAR TACHYCARDIA 11/23/2008  . MORBID OBESITY 06/10/2007  . Essential  hypertension 06/10/2007  . 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 DYSPNEA 06/10/2007  . CT, CHEST, ABNORMAL 06/10/2007  . MELANOMA, HX OF 06/10/2007    Palliative Care Plan    Recommendations/Plan:  Will DC to [facility] tomorrow.  Continue current care until discharge.  Please DC with midline in place for convenience if needed by BP.  Will do labs in am at [place] request.  Goals of Care and Additional Recommendations:  Limitations on Scope of Treatment: No further invasive treatments.  Focus on Comfort.  Code Status:  DNR  Prognosis:  Days to less than 2 weeks.  Discharge Planning:  Hospice facility  Care plan was discussed with Wife, RN.  Thank you for allowing the Palliative Medicine Team to assist in the care of this patient.  Total time spent:  35 min.     Greater than 50%  of this time was spent counseling and coordinating care related to the above assessment and plan.  Florentina Jenny, PA-C Palliative Medicine  Please contact Palliative MedicineTeam phone at [phone] for questions and concerns between 7 am - 7 pm.   Please see AMION for individual provider pager numbers.

## 2019-05-26 LAB — BASIC METABOLIC PANEL
Anion gap: 15 (ref 5–15)
BUN: 52 mg/dL — ABNORMAL HIGH (ref 8–23)
CO2: 33 mmol/L — ABNORMAL HIGH (ref 22–32)
Calcium: 9 mg/dL (ref 8.9–10.3)
Chloride: 90 mmol/L — ABNORMAL LOW (ref 98–111)
Creatinine, Ser: 2.46 mg/dL — ABNORMAL HIGH (ref 0.61–1.24)
GFR calc Af Amer: 29 mL/min — ABNORMAL LOW (ref >60)
GFR calc non Af Amer: 25 mL/min — ABNORMAL LOW (ref >60)
Glucose, Bld: 86 mg/dL (ref 70–99)
Potassium: 3.4 mmol/L — ABNORMAL LOW (ref 3.5–5.1)
Sodium: 138 mmol/L (ref 135–145)

## 2019-05-26 LAB — PROTIME-INR
INR: 3.2 — ABNORMAL HIGH (ref 0.8–1.2)
Prothrombin Time: 32.9 seconds — ABNORMAL HIGH (ref 11.4–15.2)

## 2019-05-26 MED ORDER — SPIRONOLACTONE 25 MG PO TABS: 25.0000 mg | ORAL_TABLET | Freq: Every day | ORAL | 0 refills | Status: DC

## 2019-05-26 MED ORDER — HYDROMORPHONE HCL 1 MG/ML IJ SOLN: 0.5000 mg | INTRAMUSCULAR | Status: DC | PRN

## 2019-05-26 MED ORDER — TORSEMIDE 100 MG PO TABS: 100.0000 mg | ORAL_TABLET | Freq: Two times a day (BID) | ORAL | 0 refills | Status: AC

## 2019-05-26 MED ORDER — LACTULOSE 10 GM/15ML PO SOLN: 20.0000 g | Freq: Three times a day (TID) | ORAL | 0 refills | Status: AC

## 2019-05-26 MED ORDER — POTASSIUM CHLORIDE CRYS ER 10 MEQ PO TBCR
40.0000 meq | EXTENDED_RELEASE_TABLET | Freq: Once | ORAL | Status: AC
  Administered 2019-05-26: 40 meq via ORAL
  Filled 2019-05-26: qty 4

## 2019-05-26 MED ORDER — POTASSIUM CHLORIDE CRYS ER 20 MEQ PO TBCR
40.0000 meq | EXTENDED_RELEASE_TABLET | Freq: Once | ORAL | Status: DC
  Filled 2019-05-26: qty 2

## 2019-05-26 MED ORDER — WARFARIN SODIUM 7.5 MG PO TABS: 7.5000 mg | ORAL_TABLET | Freq: Every day | ORAL | 0 refills | Status: AC

## 2019-05-26 MED ORDER — WARFARIN SODIUM 5 MG PO TABS: 5.0000 mg | ORAL_TABLET | Freq: Once | ORAL | Status: DC

## 2019-05-26 MED ORDER — ACETAZOLAMIDE 250 MG PO TABS: 250.0000 mg | ORAL_TABLET | Freq: Two times a day (BID) | ORAL | 0 refills | Status: AC

## 2019-05-26 NOTE — Progress Notes (Signed)
Report called to [facility], [place]. Patient and wife notified of transfer.  Awaiting PTAR for transfer.

## 2019-05-26 NOTE — Discharge Summary (Signed)
Physician Discharge Summary  Frank [name] MWU:[id number] DOB: 1945-06-09 DOA: 05/13/2019  PCP: [name], MD  Admit date: 05/13/2019 Discharge date: 05/26/2019  Admitted From:  Disposition: [facility]  Recommendations for Outpatient Follow-up:  1. Follow up with PCP in 1-2 weeks 2. Please obtain BMP/CBC in one week 3. Please follow up on the following pending results:  Home Health: Home Equipment/Devices: None  Discharge Condition: Fair CODE STATUS: DNR Diet recommendation: Cardiac/low-sodium  Subjective: Seen and examined.  No family member present.  He has no complaints.  Feels comfortable.  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 Fib, lymphedema, CKD-4, debility, morbid obesity, recurrent hospitalization for sepsis secondary to Streptococcus from RLE cellulitis presented with progressive dyspnea and weight gain.   In ED, hemodynamically stable except for brief tachycardia to 124.  Saturation 98% on room air. CXR consistent with CHF.  K7.0. Cr 2.67 (baseline~1.8).  INR 7.2.  Hgb 7.2 (baseline about 8.0).  EKG revealed paced rhythm.  COVID-19 negative.  Received IV Lasix 120 mg once and was started on Lasix drip by cardiologist.  Also received bicarb, IV insulin and D50, and admitted for CHF exacerbation, AKI, hyperkalemia, supratherapeutic INR and anemia.  Cardiology consulted and started on Lasix drip.  Hyperkalemia resolved with Lokelma and IV Lasix.  INR improved after vitamin K.  Hemoglobin dropped requiring intermittent blood transfusions, 7.2 (admit)>> 6.5>>4u>7.0>> 7.2>> 7.9> 7.8.  FOBT positive.  GI consulted and did not recommend EGD given his cardiac comorbidity unless signs of profuse bleeding.  Anticoagulation resumed with heparin drip and his hemoglobin remained stable. Palliative care consulted and met with patient and wife on 2/9.  CODE STATUS changed to DNR/DNI. Patient failed to make  significant progress clinically despite good diuresis and improvement in most of his numbers.  After long discussion between cardiology, wife and patient, the consensus was to transition to oral torsemide 100 mg twice daily, restart warfarin, continue acetazolamide and transfer to residential hospice on 05/26/2019.  Patient has remained stable for the past 2 days that I have seen.  He has a bed at [facility] and he is being discharged today.  Prior to hospitalization, patient was on 10 to 15 mg of Coumadin however he presented with supratherapeutic INR and since past 3 to 4 days, he has been getting Coumadin of 7.5 mg which has improved his INR from subtherapeutic to therapeutic currently so I am plan to discharge him on 7.5 mg Coumadin.  I am also discharging him on lactulose 30 mg p.o. 3 times daily to prevent acute hepatic encephalopathy.  His potassium is slightly low today.  He will receive potassium replacement today before discharge.  Discharge Diagnoses:  Principal Problem:   Acute CHF (congestive heart failure) (HCC) Active Problems:   Essential hypertension   Acute on chronic diastolic heart failure (HCC)   S/P mitral valve replacement-Mechanical   NICM (nonischemic cardiomyopathy) (HCC)   Pulmonary hypertension (HCC)   Acute renal failure superimposed on stage 3b chronic kidney disease (HCC)   Hyperkalemia   Acute on chronic right heart failure (HCC)   Status post biventricular pacemaker   Anemia   Heme positive stool   Anticoagulated   Palliative care by specialist   Slow transit constipation   Palliative care encounter   Encephalopathy due to ammonia   DNR (do not resuscitate)   Tricuspid valve insufficiency    Discharge Instructions  Discharge Instructions    Discharge patient   Complete  by: As directed    Discharge disposition: 50-Hospice/Home   Discharge patient date: 05/26/2019     Allergies as of 05/26/2019      Reactions   Warfarin And Related Other (See  Comments)   COUMADIN or JANTOVEN BRAND ONLY** Per pt, was switched to generic in 2001 and had significantly decreased absorption > INR subtherapeutic> pt suffered a TIA and has been on brand name only since       Medication List    STOP taking these medications   cephALEXin 500 MG capsule Commonly known as: KEFLEX     TAKE these medications   acetaminophen 325 MG tablet Commonly known as: TYLENOL Take 2 tablets (650 mg total) by mouth every 8 (eight) hours as needed for mild pain or fever.   acetaZOLAMIDE 250 MG tablet Commonly known as: DIAMOX Take 1 tablet (250 mg total) by mouth 2 (two) times daily.   aspirin 81 MG tablet Take 81 mg by mouth daily.   Benefiber Chew Chew 3 tablets by mouth daily.   benzonatate 100 MG capsule Commonly known as: TESSALON Take 1 capsule (100 mg total) by mouth 2 (two) times daily as needed for cough.   diphenhydramine-acetaminophen 25-500 MG Tabs tablet Commonly known as: TYLENOL PM Take 2 tablets by mouth at bedtime as needed (for sleep).   Ensure Plus Liqd Take 237 mLs by mouth 2 (two) times daily between meals.   Ensure Max Protein Liqd Take 330 mLs (11 oz total) by mouth daily.   feeding supplement (PRO-STAT SUGAR FREE 64) Liqd Take 30 mLs by mouth 2 (two) times daily. What changed:   when to take this  reasons to take this   ferrous sulfate 325 (65 FE) MG tablet Take 325 mg by mouth daily with breakfast.   fish oil-omega-3 fatty acids 1000 MG capsule Take 4 g by mouth every morning.   HYDROcodone-acetaminophen 5-325 MG tablet Commonly known as: NORCO/VICODIN Take 1 tablet by mouth every 8 (eight) hours as needed for moderate pain or severe pain.   Jantoven 10 MG tablet Generic drug: warfarin Take as directed. If you are unsure how to take this medication, talk to your nurse or doctor. Original instructions: Take 0.5 tablets (5 mg total) by mouth daily at 6 PM. Take above dose until follow up with your Coumadin clinic  then follow their instructions for dosing. What changed: Another medication with the same name was changed. Make sure you understand how and when to take each.   warfarin 7.5 MG tablet Commonly known as: COUMADIN Take as directed. If you are unsure how to take this medication, talk to your nurse or doctor. Original instructions: Take 1 tablet (7.5 mg total) by mouth daily at 6 PM. Takes 10 mg po QD; extra 5 mg on Sunday and Wednesday What changed:   medication strength  how much to take  when to take this   lactulose 10 GM/15ML solution Commonly known as: CHRONULAC Take 30 mLs (20 g total) by mouth 3 (three) times daily.   Magnesium 250 MG Tabs Take 500-750 mg by mouth See admin instructions. 750 mg in the morning, 500 mg in the evening   multivitamin tablet Take 1 tablet by mouth daily.   omeprazole 20 MG capsule Commonly known as: PRILOSEC Take 1 capsule (20 mg total) by mouth daily.   potassium chloride 10 MEQ tablet Commonly known as: KLOR-CON Take 2 tablets (20 mEq total) by mouth 2 (two) times daily. What changed: how much to  take   torsemide 100 MG tablet Commonly known as: DEMADEX Take 1 tablet (100 mg total) by mouth 2 (two) times daily. What changed:   medication strength  how much to take  additional instructions   VICKS VAPORUB EX Apply 1 application topically daily as needed (For congestion).   vitamin C 1000 MG tablet Take 2,000 mg by mouth daily.      Follow-up Information    [name], MD Follow up in 1 week(s).   Specialty: Family Medicine Contact information: [place] [address] [phone]        [name], MD .   Specialty: Cardiology Contact information: [phone] [place] [address] [phone]          Allergies  Allergen Reactions  . Warfarin And Related Other (See Comments)    COUMADIN or JANTOVEN BRAND ONLY** Per pt, was switched to generic in 2001 and  had significantly decreased absorption > INR subtherapeutic> pt suffered a TIA and has been on brand name only since     Consultations: Cardiology and PMT   Procedures/Studies: DG Chest 2 View  Result Date: 05/13/2019 CLINICAL DATA:  Shortness of breath EXAM: CHEST - 2 VIEW COMPARISON:  March 16, 2019 FINDINGS: There is a dual lead AICD. Multiple sternal wires are present. Mild, chronic appearing increased interstitial lung markings are seen. Very mild atelectasis is noted within the bilateral lung bases. There is no evidence of a pleural effusion or pneumothorax. The cardiac silhouette is markedly enlarged. Multilevel degenerative changes seen throughout the thoracic spine. IMPRESSION: 1. Marked cardiomegaly. 2. Mild, chronic appearing increased interstitial lung markings. A mild superimposed component of interstitial edema cannot be excluded. 3. Mild bibasilar atelectasis. Electronically Signed   By: Virgina Norfolk M.D.   On: 05/13/2019 18:52   ECHOCARDIOGRAM COMPLETE  Result Date: 05/15/2019   ECHOCARDIOGRAM REPORT   Patient Name:   URIYAH RASKA Spiers Date of Exam: 05/15/2019 Medical Rec #:  [id number]       Height:       71.0 in Accession #:    [id number]      Weight:       343.0 lb Date of Birth:  [birth year]      BSA:          2.65 m Patient Age:    74 years        BP:           131/69 mmHg Patient Gender: M               HR:           60 bpm. 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 CHF and Hypertrophic Cardiomyopathy,                 Pacemaker, Arrythmias:Atrial Fibrillation; Risk                 Factors:Hypertension.  Sonographer:    Mikki Santee RDCS (AE) Referring Phys: [id number] [facility]  Sonographer Comments: Image acquisition challenging due to patient body habitus. IMPRESSIONS  1. Left  ventricular ejection fraction, by visual estimation, is 55 to 60%. The left ventricle has normal function.  Left ventricular septal wall thickness was mildly increased. There is no left ventricular hypertrophy.  2. The left ventricle has no regional wall motion abnormalities.  3. 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  4. Global right ventricle has moderately reduced systolic function.The right ventricular size is moderately enlarged. No increase in right ventricular wall thickness.  5. Left atrial size was moderately dilated.  6. Right atrial size was severely dilated.  7. The mitral valve has been repaired/replaced. No evidence of mitral valve regurgitation.  8. Normal appearing mechanical MVR Compared to TTE done 03/19/19 mean gradient gone from 5-6.5 mmHg with MVA 2.4-> 2.1 cm2 by PT1/2.  9. The tricuspid valve is degenerative. 10. The tricuspid valve is degenerative. 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 11. Pacing wire across TV seems redundant and possibly interferes with TV This and previous lead removal likely contributes to patients TR and right heart failure. 12. Aortic valve regurgitation is trivial. Mild aortic valve stenosis. 13. Historically the AV has been repaired after surgery for HOCM/Ablation damaged valve. The right and non coronary cusps are thickened/calcified and may have been plicated Compared to TTE done 03/19/19 mean gradient gone from 9-> 11 mmHg and peak from 16  to 22 mmHg. 14. Pulmonic regurgitation is mild. 15. The pulmonic valve was not well visualized. Pulmonic valve regurgitation is mild. 16. Mildly elevated pulmonary artery systolic pressure. 17. The interatrial septum was not well visualized. FINDINGS  Left Ventricle: Left ventricular ejection fraction, by visual estimation, is 55 to 60%. The left ventricle has normal function. The left ventricle has no regional wall motion abnormalities. The left ventricular internal cavity size was the left  ventricle is normal in size. There is no left ventricular hypertrophy. 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 Right Ventricle: The right ventricular size is moderately enlarged. No increase in right ventricular wall thickness. Global RV systolic function is has moderately reduced systolic function. The tricuspid regurgitant velocity is 2.45 m/s, and with an assumed right atrial pressure of 15 mmHg, the estimated right ventricular systolic pressure is mildly elevated at 39.0 mmHg. Left Atrium: Left atrial size was moderately dilated. Right Atrium: Right atrial size was severely dilated Pericardium: There is no evidence of pericardial effusion. Mitral Valve: The mitral valve has been repaired/replaced. No evidence of mitral valve regurgitation. MV peak gradient, 18.7 mmHg. Normal appearing mechanical MVR Compared to TTE done 03/19/19 mean gradient gone from 5-6.5 mmHg with MVA 2.4-> 2.1 cm2 by PT1/2. Tricuspid Valve: The tricuspid valve is degenerative in appearance. 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 Pacing wire across TV seems redundant and possibly interferes with TV This and previous lead removal likely contributes to patients TR and right heart failure. Aortic Valve: The aortic valve has been repaired/replaced. Aortic valve regurgitation is trivial. Mild aortic stenosis is present. Aortic valve mean gradient measures 11.0 mmHg. Aortic valve peak gradient measures 22.3 mmHg. Aortic valve area, by VTI measures 1.69 cm. Historically the AV has been repaired after surgery for HOCM/Ablation damaged valve. The right and non coronary cusps are thickened/calcified and may have been plicated Compared to TTE done 03/19/19 mean gradient gone from 9-> 11 mmHg and peak from 16 to 22 mmHg. Pulmonic Valve: The pulmonic valve was not well visualized. Pulmonic valve regurgitation is mild. Pulmonic regurgitation is mild. Aorta: The aortic root is normal in size and structure. IAS/Shunts: The  interatrial septum was not well visualized.  LEFT VENTRICLE PLAX 2D LVOT diam:     2.40 cm LVOT  Area:     4.52 cm  RIGHT VENTRICLE RV S prime:     9.90 cm/s TAPSE (M-mode): 1.8 cm LEFT ATRIUM              Index       RIGHT ATRIUM           Index LA Vol (A2C):   151.0 ml 56.94 ml/m RA Area:     30.90 cm LA Vol (A4C):   137.0 ml 51.66 ml/m RA Volume:   109.00 ml 41.10 ml/m LA Biplane Vol: 148.0 ml 55.81 ml/m  AORTIC VALVE AV Area (Vmax):    1.47 cm AV Area (Vmean):   1.57 cm AV Area (VTI):     1.69 cm AV Vmax:           236.33 cm/s AV Vmean:          153.000 cm/s AV VTI:            0.507 m AV Peak Grad:      22.3 mmHg AV Mean Grad:      11.0 mmHg LVOT Vmax:         76.70 cm/s LVOT Vmean:        53.100 cm/s LVOT VTI:          0.190 m LVOT/AV VTI ratio: 0.37 MITRAL VALVE                         TRICUSPID VALVE MV Area (PHT): 2.13 cm              TR Peak grad:   24.0 mmHg MV Peak grad:  18.7 mmHg             TR Vmax:        254.00 cm/s MV Mean grad:  6.5 mmHg MV Vmax:       2.16 m/s              SHUNTS MV Vmean:      109.5 cm/s            Systemic VTI:  0.19 m MV VTI:        0.61 m                Systemic Diam: 2.40 cm MV PHT:        103.24 msec MV Decel Time: 356 msec MV E velocity: 172.00 cm/s 103 cm/s MV A velocity: 60.60 cm/s  70.3 cm/s MV E/A ratio:  2.84        1.5  Jenkins Rouge MD Electronically signed by Jenkins Rouge MD Signature Date/Time: 05/15/2019/12:48:20 PM    Final    CUP PACEART INCLINIC DEVICE CHECK  Result Date: 05/02/2019 Device check in clinic. see scanned report.  [name] EKG SITE RITE  Result Date: 05/16/2019 If Site Rite image not attached, placement could not be confirmed due to current cardiac rhythm.    Discharge Exam: Vitals:   05/26/19 0320 05/26/19 0807  BP: (!) 108/51 128/63  Pulse: 81 79  Resp: 16 17  Temp: 97.8 F (36.6 C)   SpO2: 91% 93%   Vitals:   05/25/19 1300 05/25/19 1722 05/26/19 0320 05/26/19 0807  BP: 123/65 (!) 109/59 (!) 108/51 128/63  Pulse: 87 81 81  79  Resp: 15 12 16 17   Temp: 98 F (36.7 C)  97.8 F (36.6 C)   TempSrc: Oral  Oral   SpO2: 95% 96% 91% 93%  Weight:   Marland Kitchen)  141.2 kg   Height:        General: Pt is alert, awake, not in acute distress Cardiovascular: RRR, S1/S2 +, no rubs, no gallops Respiratory: Diminished breath sounds at the bases, no wheezing, no rhonchi Abdominal: Soft, NT, ND, bowel sounds + Extremities: +1 pitting edema bilateral lower extremity, no cyanosis    The results of significant diagnostics from this hospitalization (including imaging, microbiology, ancillary and laboratory) are listed below for reference.     Microbiology: No results found for this or any previous visit (from the past 240 hour(s)).   Labs: BNP (last 3 results) Recent Labs    03/16/19 2359 05/13/19 1930  BNP 105.7* 841.6*   Basic Metabolic Panel: Recent Labs  Lab 05/21/19 0500 05/21/19 1720 05/22/19 0428 05/22/19 0428 05/22/19 1820 05/23/19 0515 05/24/19 0400 05/25/19 0513 05/26/19 0522  NA 138   < > 138  --   --  137 139 138 138  K 2.6*   < > 3.2*   < > 3.2* 3.2* 3.8 3.6 3.4*  CL 94*   < > 93*  --   --  94* 95* 94* 90*  CO2 32   < > 32  --   --  31 32 32 33*  GLUCOSE 104*   < > 112*  --   --  108* 97 82 86  BUN 54*   < > 51*  --   --  46* 51* 52* 52*  CREATININE 2.00*   < > 1.90*  --   --  1.95* 2.16* 2.38* 2.46*  CALCIUM 8.6*   < > 8.8*  --   --  8.9 9.2 9.2 9.0  MG 2.1  --  2.2  --   --  2.2 2.3 2.4  --   PHOS 3.7  --  2.9  --   --  3.6 4.2 4.6  --    < > = values in this interval not displayed.   Liver Function Tests: Recent Labs  Lab 05/21/19 1720 05/22/19 0428 05/23/19 0515 05/24/19 0400 05/25/19 0513  AST 46*  --   --   --   --   ALT 16  --   --   --   --   ALKPHOS 105  --   --   --   --   BILITOT 1.2  --   --   --   --   PROT 7.0  --   --   --   --   ALBUMIN 2.4* 2.5* 2.5* 2.5* 2.4*   No results for input(s): LIPASE, AMYLASE in the last 168 hours. Recent Labs  Lab 05/20/19 0343  05/21/19 0500 05/23/19 0515 05/24/19 1045 05/25/19 0513  AMMONIA 96* 92* 84* 89* 101*   CBC: Recent Labs  Lab 05/20/19 0343 05/21/19 0500 05/23/19 0515 05/24/19 0400 05/25/19 0513  WBC 7.0 6.4 7.3 10.8* 6.1  HGB 7.2* 7.4* 7.5* 7.9* 7.8*  HCT 23.4* 24.7* 25.7* 26.5* 26.5*  MCV 97.1 100.4* 102.8* 101.1* 102.3*  PLT 257 265 261 240 225   Cardiac Enzymes: No results for input(s): CKTOTAL, CKMB, CKMBINDEX, TROPONINI in the last 168 hours. BNP: Invalid input(s): POCBNP CBG: No results for input(s): GLUCAP in the last 168 hours. D-Dimer No results for input(s): DDIMER in the last 72 hours. Hgb A1c No results for input(s): HGBA1C in the last 72 hours. Lipid Profile No results for input(s): CHOL, HDL, LDLCALC, TRIG, CHOLHDL, LDLDIRECT in the last 72 hours. Thyroid function studies No  results for input(s): TSH, T4TOTAL, T3FREE, THYROIDAB in the last 72 hours.  Invalid input(s): FREET3 Anemia work up No results for input(s): VITAMINB12, FOLATE, FERRITIN, TIBC, IRON, RETICCTPCT in the last 72 hours. Urinalysis    Component Value Date/Time   COLORURINE YELLOW 03/17/2019 0054   APPEARANCEUR CLOUDY (A) 03/17/2019 0054   LABSPEC 1.006 03/17/2019 0054   PHURINE 6.0 03/17/2019 0054   GLUCOSEU NEGATIVE 03/17/2019 0054   HGBUR SMALL (A) 03/17/2019 0054   BILIRUBINUR NEGATIVE 03/17/2019 0054   KETONESUR NEGATIVE 03/17/2019 0054   PROTEINUR 30 (A) 03/17/2019 0054   NITRITE NEGATIVE 03/17/2019 0054   LEUKOCYTESUR LARGE (A) 03/17/2019 0054   Sepsis Labs Invalid input(s): PROCALCITONIN,  WBC,  LACTICIDVEN Microbiology No results found for this or any previous visit (from the past 240 hour(s)).   Time coordinating discharge: Over 30 minutes  SIGNED:   Darliss Cheney, MD  [facility] 05/26/2019, 9:49 AM  If 7PM-7AM, please contact night-coverage [email or web address]

## 2019-05-26 NOTE — Care Management Important Message (Signed)
Important Message  Patient Details  Name: Frank [name] MRN: [id number] Date of Birth: July 07, 1945   Medicare Important Message Given:  Yes     Memory Argue 05/26/2019, 12:51 PM

## 2019-05-26 NOTE — Progress Notes (Signed)
[facility] room is available for [name] this morning. Left voice message with spouse requesting call back to confirm plan to transfer today after paperwork is complete. Will update TOC manager when paperwork is complete.   Will need discharge summary sent to [phone] prior to patient leaving the unit.   Please send with patient signed and completed DNR.   RN please call report to [phone] prior to patient leaving the unit.   Thank you,  Frank Conte, LCSW [phone] Frank [name] are listed daily on AMION under Hospice and Frank [name]

## 2019-05-26 NOTE — Consult Note (Signed)
   [name] CM Inpatient Consult   05/26/2019  Frank [name] 1945-11-01 [id number]   Follow up: Active with [facility] CM  Chart review reveals patient is plan for  transitioning to [facility].  Plan: Will update [facility] RN Care Coordinator of disposition/transition.  Will sign off at transition.  No further [facility] Care management needs as patient needs will be met at Hospice.    [name], RN BSN [facility] Liaison  [phone] business mobile phone Toll free office [phone]  Fax number: [phone] [name]

## 2019-05-26 NOTE — TOC Transition Note (Signed)
Transition of Care [facility]) - CM/SW Discharge Note   Patient Details  Name: Frank [name] MRN: [id number] Date of Birth: [birth year]  [facility] of [facility]) CM/SW Contact:  Arvella Merles, LCSW Phone Number: 05/26/2019, 2:32 PM   Clinical Narrative:    Patient will DC to[facility] Anticipated DC date: 06/01/2019 Family notified: [name], wife [phone] Transport by: Corey Harold   Per MD patient ready for DC to [facility]. RN, patient, patient's family, and facility notified of DC. Discharge Summary and FL2 sent to facility. RN to call report prior to discharge [phone]). DC packet on chart. Ambulance transport requested for patient.   Patient's wife [name] asked CSW for assistance with getting Will signed and notarized. CSW spoke with Chaplain [phone]. Chaplain reported they are only able to do advanced directives. CSW spoke with wife, she stated it is an entire Will. She stated Chaplain just left bedside and is verifying if he is able to assist.   CSW will sign off for now as social work intervention is no longer needed. Please consult [name] again if new needs arise.    Final next level of care: [place] Barriers to Discharge: No Barriers Identified   Patient Goals and CMS Choice   CMS Medicare.gov Compare Post Acute Care list provided to:: Patient Represenative (must comment) Choice offered to / list presented to : Spouse  Discharge Placement                Patient to be transferred to facility by: [facility] Name of family member notified: [name], wife Patient and family notified of of transfer: 05/26/19  Discharge Plan and Services                          HH Arranged: Social Work [facility] Agency: Other - See comment([facility]) Date HH Agency Contacted: 05/23/19 Time [place]: [place]    Social Determinants of Health ([place]) Interventions     Readmission Risk Interventions No flowsheet data found.

## 2019-05-26 NOTE — TOC Progression Note (Signed)
Transition of Care [facility]) - Progression Note    Patient Details  Name: Frank [name] MRN: [id number] Date of Birth: Mar 27, 1946  Transition of Care [facility]) CM/SW [place], [place] Phone Number: 05/26/2019, 10:12 AM  Clinical Narrative:    CSW spoke with Harmon Pier from [place]. Patient has been accepted and has a bed at [facility]. CSW will be contacted once patient's wife has confirmed the family is interested in placement and paperwork has been completed.   Expected Discharge Plan: [place] Barriers to Discharge: Continued Medical Work up  Expected Discharge Plan and Services Expected Discharge Plan: [place] arrangements for the past 2 months: Single Family Home Expected Discharge Date: 05/26/19                         [facility] Arranged: Social Work [employer] Agency: [facility] Date [place]: 05/23/19 Time [place]: [place]     Social Determinants of Health (SDOH) Interventions    Readmission Risk Interventions No flowsheet data found.

## 2019-05-26 NOTE — Progress Notes (Addendum)
[facility] for Coumadin / heparin  Indication: Atrial fibrillation and mechanical heart valve  Allergies  Allergen Reactions  . Warfarin And Related Other (See Comments)    COUMADIN or JANTOVEN BRAND ONLY** Per pt, was switched to generic in 2001 and had significantly decreased absorption > INR subtherapeutic> pt suffered a TIA and has been on brand name only since     Patient Measurements: Height: 5\' 11"  (180.3 cm) Weight: (!) 311 lb 4.6 oz (141.2 kg) IBW/kg (Calculated) : 75.3  Vital Signs: Temp: 97.8 F (36.6 C) (02/15 0320) Temp Source: Oral (02/15 0320) BP: 128/63 (02/15 0807) Pulse Rate: 79 (02/15 0807)  Labs: Recent Labs    05/24/19 0400 05/25/19 0513 05/26/19 0522  HGB 7.9* 7.8*  --   HCT 26.5* 26.5*  --   PLT 240 225  --   LABPROT 24.5* 27.0* 32.9*  INR 2.2* 2.5* 3.2*  HEPARINUNFRC 0.39 0.32  --   CREATININE 2.16* 2.38* 2.46*    Estimated Creatinine Clearance: 39.6 mL/min (A) (by C-G formula based on SCr of 2.46 mg/dL (H)).    Assessment: 31 yoM on warfarin PTA for AF and mMVR admitted with CHF. Warfarin held for possible procedures and vitamin K given earlier in admit with supratherapeutic INR on admit. Heparin remains therapeutic, INR 2.0, CBC stable. Plan to restart warfarin today in anticipation of discharge Monday. Home dose below but noted that pt has poor appetite and INR was elevated on admit so will be cautious in restarting.   INR is slightly therapeutic at 3.2. Hgb 7.8, plt 225. No s/sx of bleeding. Heparin stopped.   *Home dose = 10mg  daily except 15mg  Wed  Goal of Therapy:  Heparin level: 0.3-0.5 INR 2.5-3.5 Monitor platelets by anticoagulation protocol: Yes   Plan:  -Will order warfarin 5 mg tonight  -Would recommend warfarin 7.5mg  PO daily with close INR monitoring at discharge (might need alternating 7.5 and 5 mg doses at d/c) -Daily INR, CBC, heparin level  Antonietta Jewel, PharmD, BCCCP Clinical  Pharmacist  Phone: [phone]  Please check AMION for all [place] phone numbers After 10:00 PM, call [place] [phone]

## 2019-05-27 ENCOUNTER — Other Ambulatory Visit: Payer: Self-pay | Admitting: *Deleted

## 2019-05-27 NOTE — Patient Outreach (Signed)
[facility]) Care Management [facility] CM Case Closure note- documentation only  05/27/2019  Frank [name] 02/09/1946 [id number]  [facility] CM Case closure documentation re:  [name], 74 y/o male referred to [facility] CM12/28/2020by [facility] CMA after EMMI Red Alert for General discharge. Patient had recent hospitalization December 6-21, 2020 for weakness/ AKI, pulmonary edema due to sepsis thought to be related to (R) lower extremity cellulitis. Patient was discharged home to self-care with home health services for PT and RN in place through [place].Patient has history including, but not limited to, combined CHF with cardiomyopathy; previous MVR/ AVR with pacemaker; HTN; CKD- III; A-Fib on ACT; and OSA.  Patient experienced hospital readmission February 2-15, 2021 and was discharged to [facility]; case closed accordingly.  Plan:  Will close patient [facility] CM case and make patient's PCP aware of same  Oneta Rack, RN, BSN, [place] [facility]  [phone]

## 2019-05-28 ENCOUNTER — Telehealth: Payer: Self-pay

## 2019-05-28 NOTE — Telephone Encounter (Signed)
Pt's wife called LM on VM in [facility].  States pt is currently at [facility] under Hospice care.  The Hospice doctor has discontinued pt's Warfarin, but pt's wife states he is refusing to quit taking it and has been taking daily.  Pt told wife he is due for a finger stick today, so we can expect to get a result today or tomorrow and she has requested we give the pt a call to dose it so that he has a since of normalcy.  She also states pt has been doing better at [facility] than he has ever done at the hospital. Will await results.

## 2019-05-30 NOTE — Telephone Encounter (Addendum)
Have not received results for PT/INR. [facility], who stated that they do not draw pt/inr's.   Called pt's wife to make her aware- no answer. Unable to [facility]  because voicemail box full.

## 2019-06-02 ENCOUNTER — Ambulatory Visit: Payer: Medicare Other | Admitting: Infectious Disease

## 2019-06-02 NOTE — Telephone Encounter (Signed)
Spoke to pt's wife who stated that pt is in hospice and is not doing well.  She stated that he is no longer able to take his warfarin/jantoven.

## 2019-06-03 ENCOUNTER — Telehealth (HOSPITAL_COMMUNITY): Payer: Self-pay | Admitting: *Deleted

## 2019-06-03 NOTE — Telephone Encounter (Signed)
Cephus Richer the director at [facility] place left VM requesting a direct call from [place] to discuss patient. He is currently inpatient at [facility] place and she has some concerns.   Call back # [phone] (direct line in office) [phone] (cell)  Routed to [place]

## 2019-06-03 NOTE — Telephone Encounter (Signed)
I called back and we discussed. Nothing to change at this point.

## 2019-06-04 ENCOUNTER — Ambulatory Visit: Payer: Medicare Other | Admitting: *Deleted

## 2019-06-24 ENCOUNTER — Ambulatory Visit: Payer: Medicare Other | Admitting: Internal Medicine

## 2019-06-24 ENCOUNTER — Telehealth (HOSPITAL_COMMUNITY): Payer: Self-pay | Admitting: *Deleted

## 2019-07-01 ENCOUNTER — Ambulatory Visit: Payer: Medicare Other | Admitting: Internal Medicine

## 2019-07-10 NOTE — Telephone Encounter (Signed)
Frank [name] at [facility] called to report pt is deceased. Pt passed away today at 12:58pm. His wife was at his bedside.

## 2019-07-10 DEATH — deceased

## 2020-06-25 IMAGING — CR DG CHEST 2V
2 series · 2 of 2 positions shown · non-contrast
Comparison: March 16, 2019

CLINICAL DATA: Shortness of breath

EXAM:
CHEST - 2 VIEW

[chest lat]
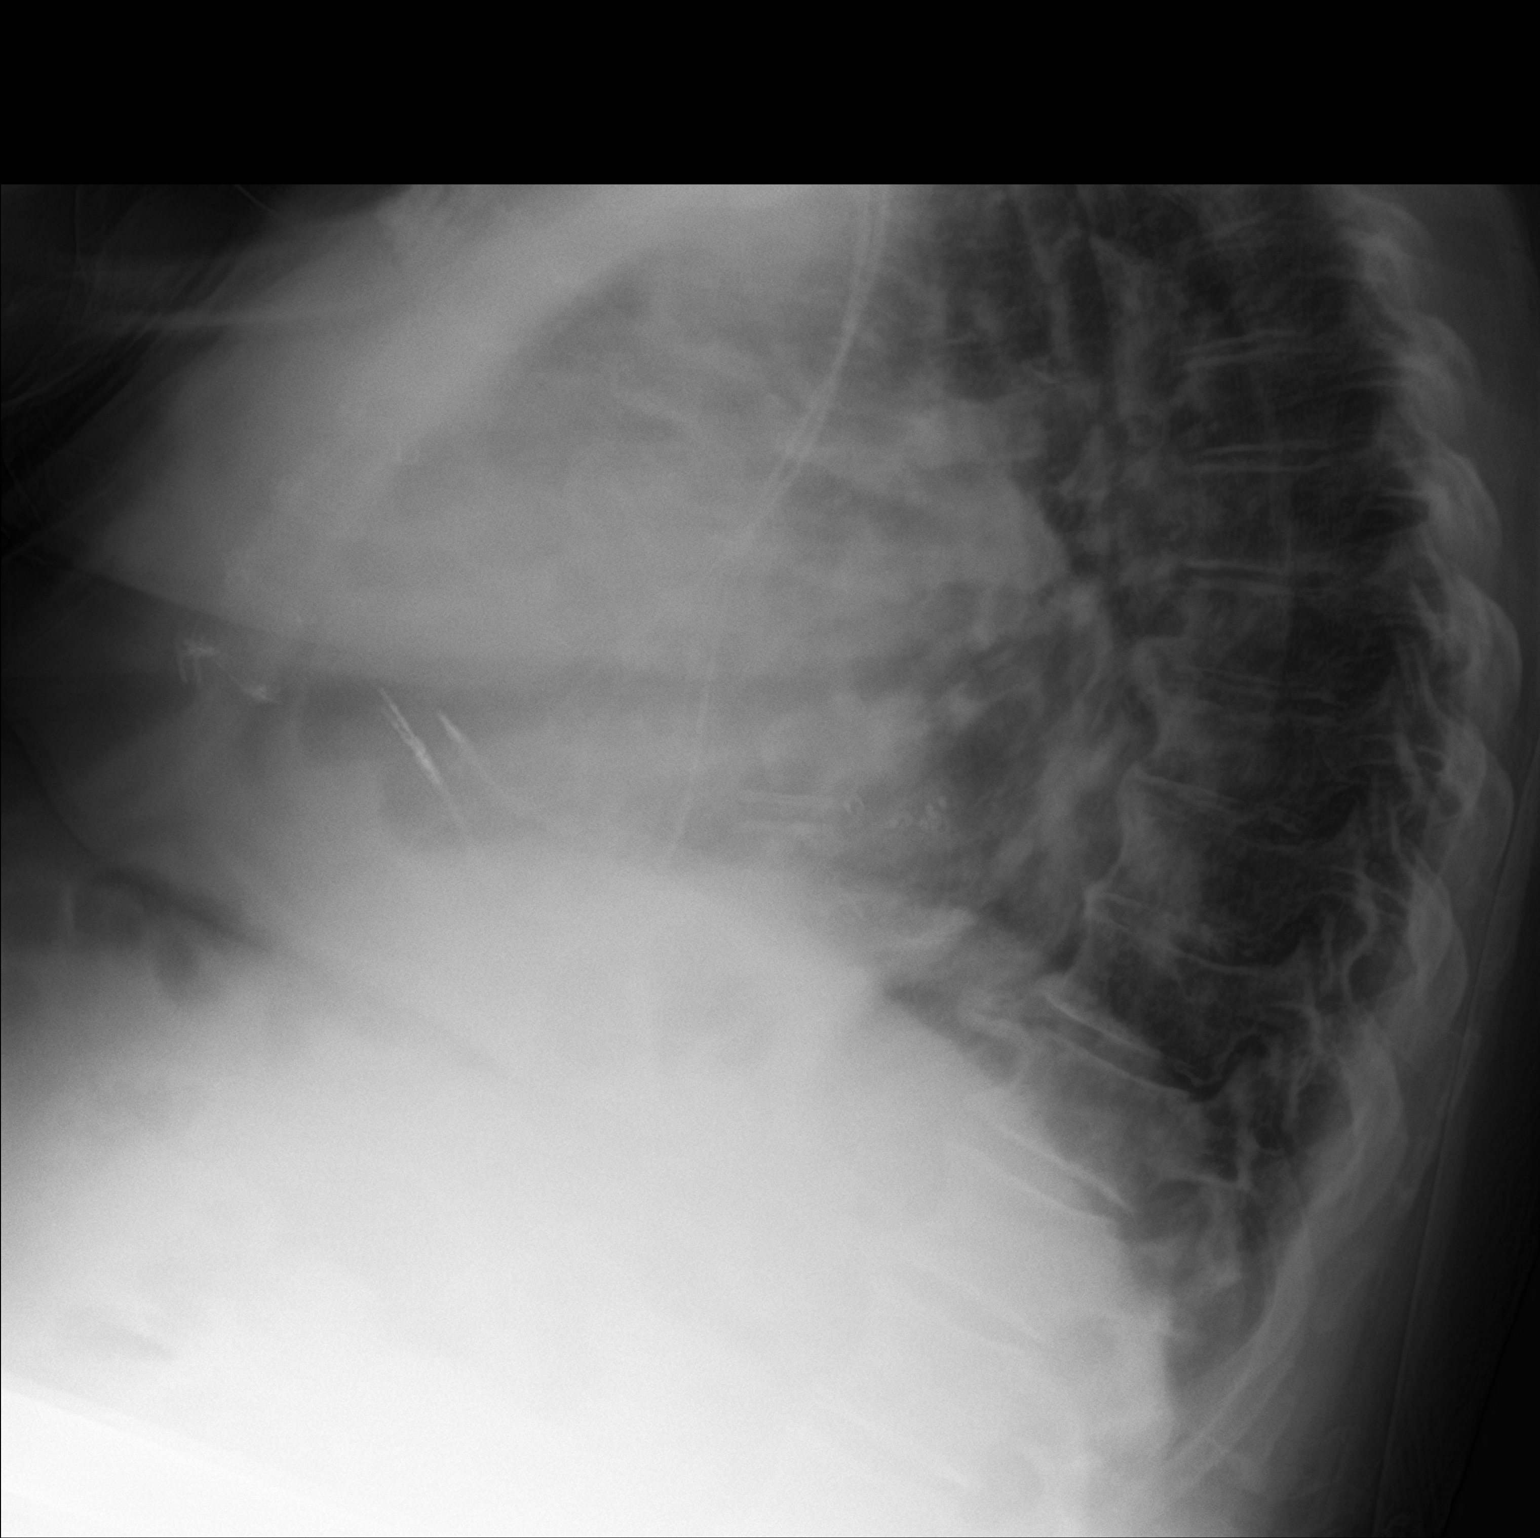

[chest ap]
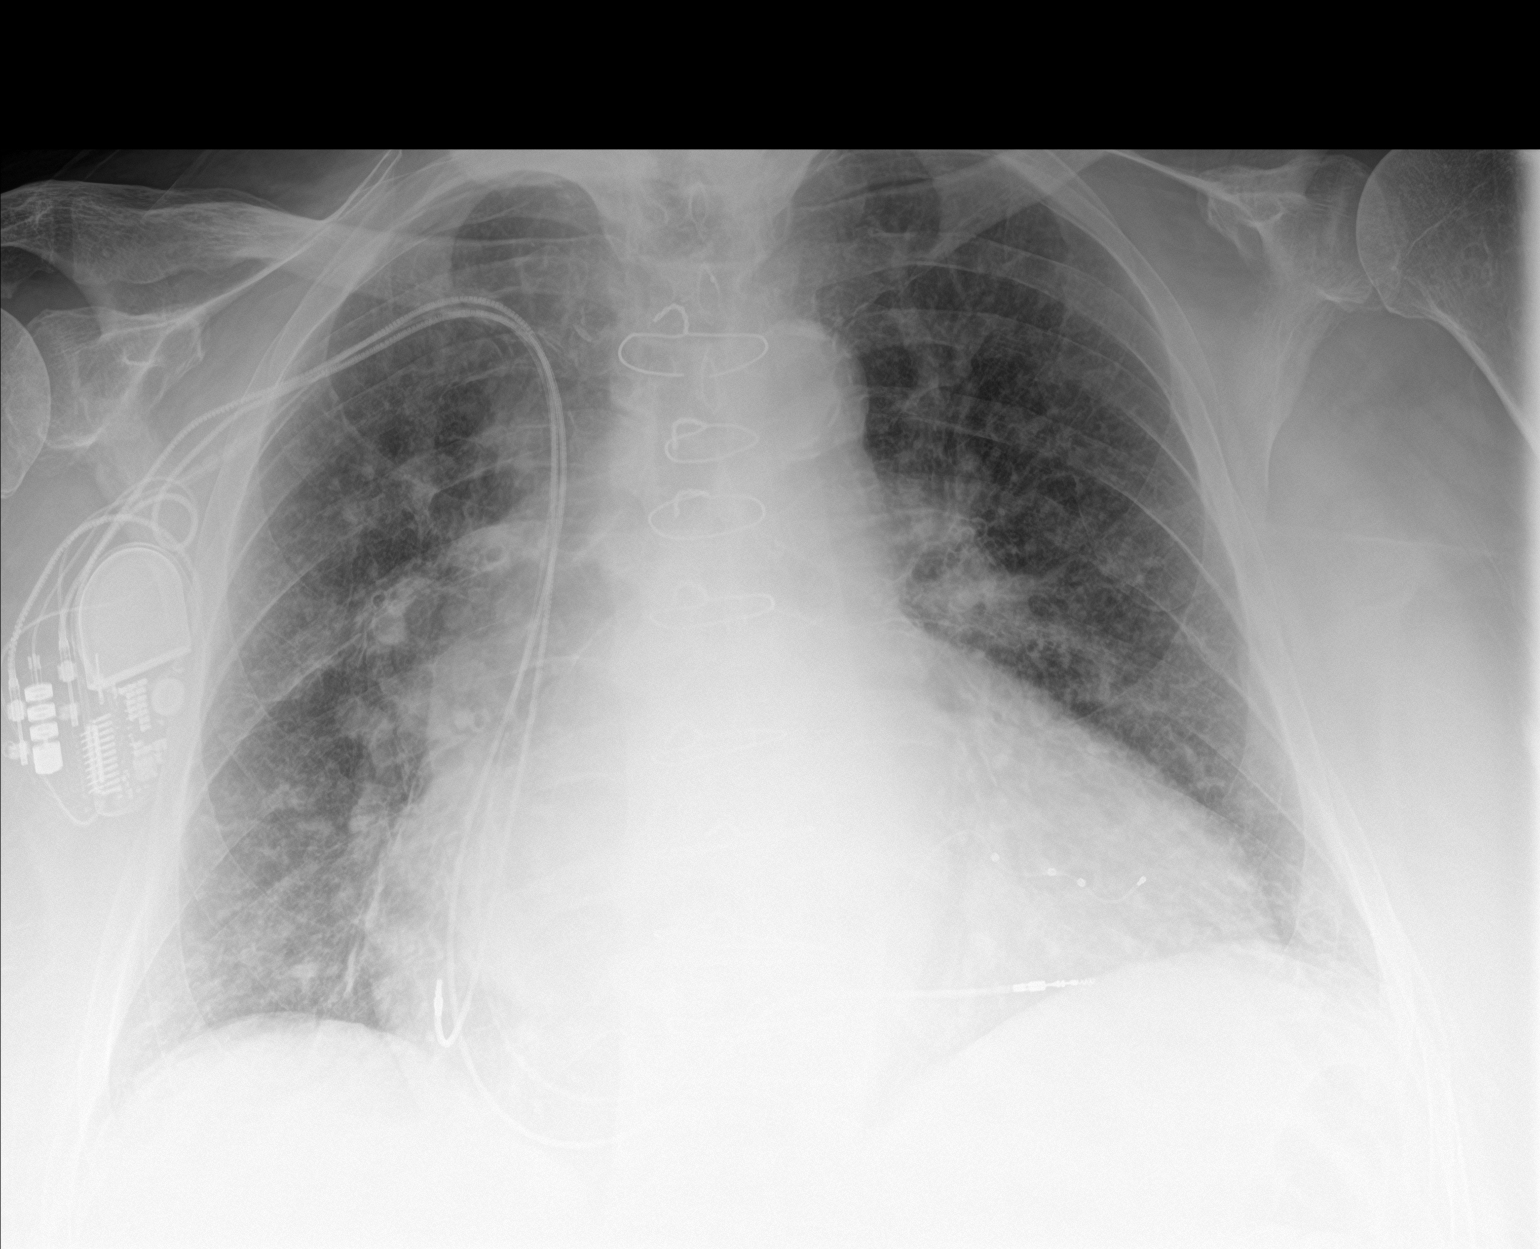

[2 of 2 positions shown; findings below may reference images not displayed]

FINDINGS: There is a dual lead AICD.

Multiple sternal wires are present.

Mild, chronic appearing increased interstitial lung markings are
seen.

Very mild atelectasis is noted within the bilateral lung bases.

There is no evidence of a pleural effusion or pneumothorax.

The cardiac silhouette is markedly enlarged.

Multilevel degenerative changes seen throughout the thoracic spine.
IMPRESSION: 1. Marked cardiomegaly.
2. Mild, chronic appearing increased interstitial lung markings. A
mild superimposed component of interstitial edema cannot be
excluded.
3. Mild bibasilar atelectasis.

## 2021-03-30 NOTE — Progress Notes (Signed)
This encounter was created in error - please disregard.
